# Patient Record
Sex: Male | Born: 1945
Health system: Southern US, Community
[De-identification: ages and names within clinical notes are randomized; demographics above are authoritative.]

## PROBLEM LIST (undated history)

## (undated) DIAGNOSIS — E162 Hypoglycemia, unspecified: Secondary | ICD-10-CM

## (undated) DIAGNOSIS — C801 Malignant (primary) neoplasm, unspecified: Secondary | ICD-10-CM

## (undated) DIAGNOSIS — R609 Edema, unspecified: Secondary | ICD-10-CM

## (undated) DIAGNOSIS — R600 Localized edema: Secondary | ICD-10-CM

## (undated) DIAGNOSIS — F419 Anxiety disorder, unspecified: Secondary | ICD-10-CM

## (undated) DIAGNOSIS — F32A Depression, unspecified: Secondary | ICD-10-CM

## (undated) DIAGNOSIS — T7840XA Allergy, unspecified, initial encounter: Secondary | ICD-10-CM

## (undated) DIAGNOSIS — H269 Unspecified cataract: Secondary | ICD-10-CM

## (undated) DIAGNOSIS — M1990 Unspecified osteoarthritis, unspecified site: Secondary | ICD-10-CM

## (undated) DIAGNOSIS — I10 Essential (primary) hypertension: Secondary | ICD-10-CM

## (undated) HISTORY — DX: Depression, unspecified: F32.A

## (undated) HISTORY — DX: Unspecified osteoarthritis, unspecified site: M19.90

## (undated) HISTORY — PX: GUM SURGERY: SHX658

## (undated) HISTORY — DX: Unspecified cataract: H26.9

## (undated) HISTORY — DX: Anxiety disorder, unspecified: F41.9

## (undated) HISTORY — DX: Allergy, unspecified, initial encounter: T78.40XA

## (undated) HISTORY — PX: COLON SURGERY: SHX602

---

## 2013-05-06 DIAGNOSIS — H2510 Age-related nuclear cataract, unspecified eye: Secondary | ICD-10-CM | POA: Diagnosis not present

## 2013-11-09 DIAGNOSIS — D1801 Hemangioma of skin and subcutaneous tissue: Secondary | ICD-10-CM | POA: Diagnosis not present

## 2013-11-09 DIAGNOSIS — L739 Follicular disorder, unspecified: Secondary | ICD-10-CM | POA: Diagnosis not present

## 2013-11-09 DIAGNOSIS — L259 Unspecified contact dermatitis, unspecified cause: Secondary | ICD-10-CM | POA: Diagnosis not present

## 2013-11-09 DIAGNOSIS — L821 Other seborrheic keratosis: Secondary | ICD-10-CM | POA: Diagnosis not present

## 2013-11-09 DIAGNOSIS — B351 Tinea unguium: Secondary | ICD-10-CM | POA: Diagnosis not present

## 2014-07-17 DIAGNOSIS — H1131 Conjunctival hemorrhage, right eye: Secondary | ICD-10-CM | POA: Diagnosis not present

## 2014-07-17 DIAGNOSIS — H2513 Age-related nuclear cataract, bilateral: Secondary | ICD-10-CM | POA: Diagnosis not present

## 2014-08-09 DIAGNOSIS — H2513 Age-related nuclear cataract, bilateral: Secondary | ICD-10-CM | POA: Diagnosis not present

## 2014-09-20 DIAGNOSIS — M545 Low back pain: Secondary | ICD-10-CM | POA: Diagnosis not present

## 2014-11-13 DIAGNOSIS — L821 Other seborrheic keratosis: Secondary | ICD-10-CM | POA: Diagnosis not present

## 2014-11-13 DIAGNOSIS — I872 Venous insufficiency (chronic) (peripheral): Secondary | ICD-10-CM | POA: Diagnosis not present

## 2014-11-13 DIAGNOSIS — I8312 Varicose veins of left lower extremity with inflammation: Secondary | ICD-10-CM | POA: Diagnosis not present

## 2014-11-13 DIAGNOSIS — L738 Other specified follicular disorders: Secondary | ICD-10-CM | POA: Diagnosis not present

## 2014-11-13 DIAGNOSIS — D1801 Hemangioma of skin and subcutaneous tissue: Secondary | ICD-10-CM | POA: Diagnosis not present

## 2014-11-13 DIAGNOSIS — I8311 Varicose veins of right lower extremity with inflammation: Secondary | ICD-10-CM | POA: Diagnosis not present

## 2014-11-29 ENCOUNTER — Other Ambulatory Visit: Payer: Self-pay | Admitting: Orthopedic Surgery

## 2014-11-29 DIAGNOSIS — M19071 Primary osteoarthritis, right ankle and foot: Secondary | ICD-10-CM | POA: Diagnosis not present

## 2014-11-29 DIAGNOSIS — M19072 Primary osteoarthritis, left ankle and foot: Secondary | ICD-10-CM | POA: Diagnosis not present

## 2014-11-29 DIAGNOSIS — R600 Localized edema: Secondary | ICD-10-CM

## 2014-11-29 DIAGNOSIS — R102 Pelvic and perineal pain: Secondary | ICD-10-CM

## 2014-11-29 DIAGNOSIS — M1612 Unilateral primary osteoarthritis, left hip: Secondary | ICD-10-CM | POA: Diagnosis not present

## 2014-11-29 DIAGNOSIS — M2550 Pain in unspecified joint: Secondary | ICD-10-CM | POA: Diagnosis not present

## 2014-11-29 HISTORY — DX: Localized edema: R60.0

## 2014-12-09 ENCOUNTER — Ambulatory Visit
Admission: RE | Admit: 2014-12-09 | Discharge: 2014-12-09 | Disposition: A | Discharge status: Home / Self Care | Payer: Medicare Other | Source: Ambulatory Visit | Attending: Orthopedic Surgery | Admitting: Orthopedic Surgery

## 2014-12-09 DIAGNOSIS — C801 Malignant (primary) neoplasm, unspecified: Secondary | ICD-10-CM | POA: Diagnosis not present

## 2014-12-09 DIAGNOSIS — C7951 Secondary malignant neoplasm of bone: Secondary | ICD-10-CM | POA: Diagnosis not present

## 2014-12-09 DIAGNOSIS — C7989 Secondary malignant neoplasm of other specified sites: Secondary | ICD-10-CM | POA: Diagnosis not present

## 2014-12-09 DIAGNOSIS — R102 Pelvic and perineal pain: Secondary | ICD-10-CM

## 2014-12-09 MED ORDER — GADOBENATE DIMEGLUMINE 529 MG/ML IV SOLN
15.0000 mL | Freq: Once | INTRAVENOUS | Status: AC | PRN
  Administered 2014-12-09: 15 mL via INTRAVENOUS

## 2014-12-10 ENCOUNTER — Other Ambulatory Visit: Payer: Self-pay

## 2014-12-14 ENCOUNTER — Other Ambulatory Visit: Payer: Self-pay | Admitting: Urology

## 2014-12-14 DIAGNOSIS — C7951 Secondary malignant neoplasm of bone: Secondary | ICD-10-CM

## 2014-12-14 DIAGNOSIS — C775 Secondary and unspecified malignant neoplasm of intrapelvic lymph nodes: Secondary | ICD-10-CM | POA: Diagnosis not present

## 2014-12-14 DIAGNOSIS — N402 Nodular prostate without lower urinary tract symptoms: Secondary | ICD-10-CM | POA: Diagnosis not present

## 2014-12-14 DIAGNOSIS — N1330 Unspecified hydronephrosis: Secondary | ICD-10-CM | POA: Diagnosis not present

## 2014-12-18 ENCOUNTER — Other Ambulatory Visit: Payer: Self-pay | Admitting: Urology

## 2014-12-18 DIAGNOSIS — R1032 Left lower quadrant pain: Secondary | ICD-10-CM

## 2014-12-26 ENCOUNTER — Other Ambulatory Visit: Payer: Self-pay | Admitting: Physician Assistant

## 2014-12-27 ENCOUNTER — Ambulatory Visit (HOSPITAL_COMMUNITY)
Admission: RE | Admit: 2014-12-27 | Discharge: 2014-12-27 | Disposition: A | Discharge status: Home / Self Care | Payer: Medicare Other | Source: Ambulatory Visit | Attending: Urology | Admitting: Urology

## 2014-12-27 ENCOUNTER — Encounter (HOSPITAL_COMMUNITY): Payer: Self-pay

## 2014-12-27 DIAGNOSIS — R599 Enlarged lymph nodes, unspecified: Secondary | ICD-10-CM | POA: Diagnosis not present

## 2014-12-27 DIAGNOSIS — R972 Elevated prostate specific antigen [PSA]: Secondary | ICD-10-CM | POA: Insufficient documentation

## 2014-12-27 DIAGNOSIS — R590 Localized enlarged lymph nodes: Secondary | ICD-10-CM | POA: Diagnosis not present

## 2014-12-27 DIAGNOSIS — C779 Secondary and unspecified malignant neoplasm of lymph node, unspecified: Secondary | ICD-10-CM | POA: Diagnosis not present

## 2014-12-27 DIAGNOSIS — R1030 Lower abdominal pain, unspecified: Secondary | ICD-10-CM | POA: Diagnosis not present

## 2014-12-27 DIAGNOSIS — C774 Secondary and unspecified malignant neoplasm of inguinal and lower limb lymph nodes: Secondary | ICD-10-CM | POA: Insufficient documentation

## 2014-12-27 DIAGNOSIS — C801 Malignant (primary) neoplasm, unspecified: Secondary | ICD-10-CM | POA: Diagnosis not present

## 2014-12-27 DIAGNOSIS — C7951 Secondary malignant neoplasm of bone: Secondary | ICD-10-CM | POA: Insufficient documentation

## 2014-12-27 DIAGNOSIS — N400 Enlarged prostate without lower urinary tract symptoms: Secondary | ICD-10-CM | POA: Insufficient documentation

## 2014-12-27 DIAGNOSIS — R1032 Left lower quadrant pain: Secondary | ICD-10-CM | POA: Insufficient documentation

## 2014-12-27 LAB — CBC
HCT: 37.4 % — ABNORMAL LOW (ref 39.0–52.0)
Hemoglobin: 11.8 g/dL — ABNORMAL LOW (ref 13.0–17.0)
MCH: 28.3 pg (ref 26.0–34.0)
MCHC: 31.6 g/dL (ref 30.0–36.0)
MCV: 89.7 fL (ref 78.0–100.0)
PLATELETS: 341 10*3/uL (ref 150–400)
RBC: 4.17 MIL/uL — AB (ref 4.22–5.81)
RDW: 15.3 % (ref 11.5–15.5)
WBC: 7.6 10*3/uL (ref 4.0–10.5)

## 2014-12-27 LAB — APTT: aPTT: 29 seconds (ref 24–37)

## 2014-12-27 LAB — PROTIME-INR
INR: 1.1 (ref 0.00–1.49)
PROTHROMBIN TIME: 14.4 s (ref 11.6–15.2)

## 2014-12-27 MED ORDER — MIDAZOLAM HCL 2 MG/2ML IJ SOLN
INTRAMUSCULAR | Status: AC
  Filled 2014-12-27: qty 4

## 2014-12-27 MED ORDER — MIDAZOLAM HCL 2 MG/2ML IJ SOLN
INTRAMUSCULAR | Status: AC | PRN
  Administered 2014-12-27: 0.5 mg via INTRAVENOUS
  Administered 2014-12-27: 1 mg via INTRAVENOUS

## 2014-12-27 MED ORDER — NALOXONE HCL 0.4 MG/ML IJ SOLN
INTRAMUSCULAR | Status: AC
  Filled 2014-12-27: qty 1

## 2014-12-27 MED ORDER — SODIUM CHLORIDE 0.9 % IV SOLN: INTRAVENOUS | Status: DC

## 2014-12-27 MED ORDER — FENTANYL CITRATE (PF) 100 MCG/2ML IJ SOLN
INTRAMUSCULAR | Status: AC
  Filled 2014-12-27: qty 2

## 2014-12-27 MED ORDER — FLUMAZENIL 0.5 MG/5ML IV SOLN
INTRAVENOUS | Status: AC
  Filled 2014-12-27: qty 5

## 2014-12-27 MED ORDER — FENTANYL CITRATE (PF) 100 MCG/2ML IJ SOLN
INTRAMUSCULAR | Status: AC | PRN
  Administered 2014-12-27: 50 ug via INTRAVENOUS

## 2014-12-27 NOTE — Procedures (Signed)
Interventional Radiology Procedure Note  Procedure:  Ultrasound guided core biopsy of left inguinal lymph node  Complications:  None  Estimated Blood Loss: < 10 mL  Largest high left inguinal node targeted.  18 G core biopsy x 4.  No complications.  Venetia Night. Kathlene Cote, M.D Pager:  [phone]

## 2014-12-27 NOTE — H&P (Signed)
Chief Complaint: Patient was seen in consultation today for US guided left inguinal lymph node biopsy  Referring Physician(s): Manny,Theodore  History of Present Illness: Philip [name] is a 69 y.o. male with history of lower extremity swelling and subsequent imaging revealing an enlarged lobulated prostate with widespread osseous metastasis and adenopathy in the pelvis/ left inguinal region. Patient also has markedly elevated PSA. He presents today for ultrasound-guided biopsy of an enlarged left inguinal lymph node for further evaluation.  No past medical history on file.  Past Surgical History  Procedure Laterality Date  . Gum surgery      Allergies: Review of patient's allergies indicates no known allergies.  Medications: Prior to Admission medications   Medication Sig Start Date End Date Taking? Authorizing Provider  BIOTIN PO Take 1 tablet by mouth daily.   Yes Historical Provider, MD  fexofenadine (ALLEGRA) 180 MG tablet Take 180 mg by mouth daily.   Yes Historical Provider, MD  fluticasone (FLONASE) 50 MCG/ACT nasal spray Place 1 spray into both nostrils daily as needed for allergies or rhinitis.   Yes Historical Provider, MD  Glycerin-Polysorbate 80 (REFRESH DRY EYE THERAPY OP) Apply 1 drop to eye daily.   Yes Historical Provider, MD  HYDROcodone-acetaminophen (NORCO/VICODIN) 5-325 MG tablet Take 1-2 tablets by mouth at bedtime as needed for moderate pain.  11/29/14  Yes Historical Provider, MD  hydrOXYzine (ATARAX/VISTARIL) 10 MG tablet Take 10 mg by mouth every 8 (eight) hours. 11/27/14  Yes Historical Provider, MD  ibuprofen (ADVIL,MOTRIN) 200 MG tablet Take 400 mg by mouth every 6 (six) hours as needed for moderate pain.   Yes Historical Provider, MD  Multiple Vitamin (MULTIVITAMIN WITH MINERALS) TABS tablet Take 1 tablet by mouth daily.   Yes Historical Provider, MD     No family history on file.  Social History   Social History  . Marital Status: Married   Spouse Name: N/A  . Number of Children: N/A  . Years of Education: N/A   Social History Main Topics  . Smoking status: Never Smoker   . Smokeless tobacco: Not on file  . Alcohol Use: 0.6 oz/week    1 Glasses of wine per week     Comment: occasional wine with dinner  . Drug Use: Not on file  . Sexual Activity: Not on file   Other Topics Concern  . Not on file   Social History Narrative  . No narrative on file      Review of Systems  Constitutional: Negative for fever and chills.  Respiratory: Negative for cough and shortness of breath.   Cardiovascular: Positive for leg swelling. Negative for chest pain.  Gastrointestinal: Negative for nausea, vomiting, abdominal pain and blood in stool.  Genitourinary: Positive for urgency and frequency. Negative for dysuria and hematuria.  Musculoskeletal: Negative for back pain.  Neurological: Negative for headaches.    Vital Signs: BP 163/87 mmHg  Pulse 85  Temp(Src) 98.9 F (37.2 C) (Oral)  Resp 18  Ht 6\' 4"  (1.93 m)  Wt 185 lb (83.915 kg)  BMI 22.53 kg/m2  SpO2 98%  Physical Exam  Constitutional: He appears well-developed and well-nourished.  Cardiovascular: Normal rate and regular rhythm.   Pulmonary/Chest: Effort normal.  Few bibasilar crackles  Abdominal: Soft. Bowel sounds are normal.  Musculoskeletal:  2-3+ left lower extremity edema, right lower extremity without significant edema    Mallampati Score:     Imaging: Mr [name]  12/09/2014  CLINICAL DATA:  Pelvic  pain.  Abnormal radiographs. EXAM: MRI PELVIS WITHOUT AND WITH CONTRAST TECHNIQUE: Multiplanar multisequence MR imaging of the pelvis was performed both before and after administration of intravenous contrast. CONTRAST:  69mL MULTIHANCE GADOBENATE DIMEGLUMINE 529 MG/ML IV SOLN COMPARISON:  None. FINDINGS: There are innumerable metastatic lesions throughout the visualized portion of the lumbar spine, pelvic bones, and proximal femurs. There is  also prominent adenopathy in both sides of the pelvis with the largest nodes measuring 23 mm in diameter on image 20 of series 5. There is left inguinal adenopathy with a 16 mm node visible on image 27 of series 6. There is also periaortic adenopathy just above the aortic bifurcation with a 16 mm node to the left and a 17 mm node to the right. The prostate gland is lobulated and abnormally enlarged. The left ureter is enlarged in the upper pelvis with a small amount of periureteral fluid seen on image 1 of series 6 but the ureter is not distended distally. I suspect that the tumor in the pelvis is compressing the distal left ureter. There are no pathologic fractures at this time. Small bilateral hip effusions felt to be due to the metastases in the femoral heads and necks. The patient is at risk for pathologic fractures. IMPRESSION: Widespread osseous metastases with metastatic adenopathy in the pelvis and left inguinal region. Enlarged lobulated prostate gland. I suspect that this represents metastatic prostate cancer. Obstruction of the distal left ureter. Electronically Signed   By: Lorriane Shire M.D.   On: 12/09/2014 16:34    Labs:  CBC:  Recent Labs  12/27/14 1200  WBC 7.6  HGB 11.8*  HCT 37.4*  PLT 341    COAGS:  Recent Labs  12/27/14 1200  INR 1.10  APTT 29    BMP: No results for input(s): NA, K, CL, CO2, GLUCOSE, BUN, CALCIUM, CREATININE, GFRNONAA, GFRAA in the last 8760 hours.  Invalid input(s): CMP  LIVER FUNCTION TESTS: No results for input(s): BILITOT, AST, ALT, ALKPHOS, PROT, ALBUMIN in the last 8760 hours.  TUMOR MARKERS: No results for input(s): AFPTM, CEA, CA199, CHROMGRNA in the last 8760 hours.  Assessment and Plan: Salif Tay is a 69 y.o. male with history of lower extremity swelling and subsequent imaging revealing an enlarged lobulated prostate with widespread osseous metastasis and adenopathy in the pelvis/ left inguinal region. Patient also has markedly  elevated PSA. He presents today for ultrasound-guided biopsy of an enlarged left inguinal lymph node for further evaluation.Risks and benefits discussed with the patient/wife including, but not limited to bleeding, infection, damage to adjacent structures or low yield requiring additional tests. All of the patient's questions were answered, patient is agreeable to proceed. Consent signed and in chart.     Thank you for this interesting consult.  I greatly enjoyed meeting [employer] and look forward to participating in their care.  A copy of this report was sent to the requesting provider on this date.  Signed: D. Rowe Robert 12/27/2014, 1:03 PM   I spent a total of 15 minutes in face to face in clinical consultation, greater than 50% of which was counseling/coordinating care for ultrasound-guided left inguinal lymph node biopsy

## 2014-12-27 NOTE — Discharge Instructions (Signed)
Needle Biopsy, Care After °Refer to this sheet in the next few weeks. These instructions provide you with information about caring for yourself after your procedure. Your health care provider may also give you more specific instructions. Your treatment has been planned according to current medical practices, but problems sometimes occur. Call your health care provider if you have any problems or questions after your procedure. °WHAT TO EXPECT AFTER THE PROCEDURE °After your procedure, it is common to have soreness, bruising, or mild pain at the biopsy site. This should go away in a few days. °HOME CARE INSTRUCTIONS °· Rest as directed by your health care provider. °· Take medicines only as directed by your health care provider. °· There are many different ways to close and cover the biopsy site, including stitches (sutures), skin glue, and adhesive strips. Follow your health care provider's instructions about: °· Biopsy site care. °· Bandage (dressing) changes and removal. °· Biopsy site closure removal. °· Check your biopsy site every day for signs of infection. Watch for: °· Redness, swelling, or pain. °· Fluid, blood, or pus. °SEEK MEDICAL CARE IF: °· You have a fever. °· You have redness, swelling, or pain at the biopsy site that lasts longer than a few days. °· You have fluid, blood, or pus coming from the biopsy site. °· You feel nauseous. °· You vomit. °SEEK IMMEDIATE MEDICAL CARE IF: °· You have shortness of breath. °· You have trouble breathing. °· You have chest pain.   °· You feel dizzy or you faint. °· You have bleeding that does not stop with pressure or a bandage. °· You cough up blood. °· You have pain in your abdomen. °  °This information is not intended to replace advice given to you by your health care provider. Make sure you discuss any questions you have with your health care provider. °  °Document Released: 07/04/2014 Document Reviewed: 07/04/2014 °Elsevier Interactive Patient Education ©2016  Elsevier Inc[employer] °Moderate Conscious Sedation, Adult, Care After °Refer to this sheet in the next few weeks. These instructions provide you with information on caring for yourself after your procedure. Your health care provider may also give you more specific instructions. Your treatment has been planned according to current medical practices, but problems sometimes occur. Call your health care provider if you have any problems or questions after your procedure. °WHAT TO EXPECT AFTER THE PROCEDURE  °After your procedure: °· You may feel sleepy, clumsy, and have poor balance for several hours. °· Vomiting may occur if you eat too soon after the procedure. °HOME CARE INSTRUCTIONS °· Do not participate in any activities where you could become injured for at least 24 hours. Do not: °¨ Drive. °¨ Swim. °¨ Ride a bicycle. °¨ Operate heavy machinery. °¨ Cook. °¨ Use power tools. °¨ Climb ladders. °¨ Work from a high place. °· Do not make important decisions or sign legal documents until you are improved. °· If you vomit, drink water, juice, or soup when you can drink without vomiting. Make sure you have little or no nausea before eating solid foods. °· Only take over-the-counter or prescription medicines for pain, discomfort, or fever as directed by your health care provider. °· Make sure you and your family fully understand everything about the medicines given to you, including what side effects may occur. °· You should not drink alcohol, take sleeping pills, or take medicines that cause drowsiness for at least 24 hours. °· If you smoke, do not smoke without supervision. °· If you are   feeling better, you may resume normal activities 24 hours after you were sedated. °· Keep all appointments with your health care provider. °SEEK MEDICAL CARE IF: °· Your skin is pale or bluish in color. °· You continue to feel nauseous or vomit. °· Your pain is getting worse and is not helped by medicine. °· You have bleeding or swelling. °· You  are still sleepy or feeling clumsy after 24 hours. °SEEK IMMEDIATE MEDICAL CARE IF: °· You develop a rash. °· You have difficulty breathing. °· You develop any type of allergic problem. °· You have a fever. °MAKE SURE YOU: °· Understand these instructions. °· Will watch your condition. °· Will get help right away if you are not doing well or get worse. °  °This information is not intended to replace advice given to you by your health care provider. Make sure you discuss any questions you have with your health care provider. °  °Document Released: 12/08/2012 Document Revised: 03/10/2014 Document Reviewed: 12/08/2012 °Elsevier Interactive Patient Education ©2016 Elsevier Inc. ° ° ° °

## 2014-12-29 ENCOUNTER — Encounter (HOSPITAL_COMMUNITY)
Admission: RE | Admit: 2014-12-29 | Discharge: 2014-12-29 | Disposition: A | Discharge status: Home / Self Care | Payer: Medicare Other | Source: Ambulatory Visit | Attending: Urology | Admitting: Urology

## 2014-12-29 DIAGNOSIS — C61 Malignant neoplasm of prostate: Secondary | ICD-10-CM | POA: Diagnosis not present

## 2014-12-29 DIAGNOSIS — C7951 Secondary malignant neoplasm of bone: Secondary | ICD-10-CM | POA: Insufficient documentation

## 2014-12-29 DIAGNOSIS — C775 Secondary and unspecified malignant neoplasm of intrapelvic lymph nodes: Secondary | ICD-10-CM | POA: Diagnosis not present

## 2014-12-29 DIAGNOSIS — R591 Generalized enlarged lymph nodes: Secondary | ICD-10-CM | POA: Diagnosis not present

## 2014-12-29 DIAGNOSIS — N402 Nodular prostate without lower urinary tract symptoms: Secondary | ICD-10-CM | POA: Diagnosis not present

## 2014-12-29 MED ORDER — TECHNETIUM TC 99M MEDRONATE IV KIT
26.0000 | PACK | Freq: Once | INTRAVENOUS | Status: AC | PRN
  Administered 2014-12-29: 26 via INTRAVENOUS

## 2015-01-01 ENCOUNTER — Other Ambulatory Visit: Payer: Self-pay | Admitting: Urology

## 2015-01-02 ENCOUNTER — Encounter (HOSPITAL_COMMUNITY): Payer: Self-pay | Admitting: *Deleted

## 2015-01-03 ENCOUNTER — Encounter (HOSPITAL_COMMUNITY): Payer: Self-pay

## 2015-01-03 ENCOUNTER — Ambulatory Visit (HOSPITAL_COMMUNITY): Payer: Medicare Other | Admitting: Registered Nurse

## 2015-01-03 ENCOUNTER — Ambulatory Visit (HOSPITAL_COMMUNITY)
Admission: RE | Admit: 2015-01-03 | Discharge: 2015-01-03 | Disposition: A | Discharge status: Home / Self Care | Payer: Medicare Other | Source: Ambulatory Visit | Attending: Urology | Admitting: Urology

## 2015-01-03 ENCOUNTER — Encounter (HOSPITAL_COMMUNITY)
Admission: RE | Disposition: A | Discharge status: Home / Self Care | Payer: Self-pay | Source: Ambulatory Visit | Attending: Urology

## 2015-01-03 DIAGNOSIS — C7951 Secondary malignant neoplasm of bone: Secondary | ICD-10-CM | POA: Diagnosis not present

## 2015-01-03 DIAGNOSIS — C61 Malignant neoplasm of prostate: Secondary | ICD-10-CM | POA: Insufficient documentation

## 2015-01-03 DIAGNOSIS — D2921 Benign neoplasm of right testis: Secondary | ICD-10-CM | POA: Diagnosis not present

## 2015-01-03 DIAGNOSIS — C775 Secondary and unspecified malignant neoplasm of intrapelvic lymph nodes: Secondary | ICD-10-CM | POA: Diagnosis not present

## 2015-01-03 DIAGNOSIS — R5383 Other fatigue: Secondary | ICD-10-CM | POA: Diagnosis not present

## 2015-01-03 DIAGNOSIS — R590 Localized enlarged lymph nodes: Secondary | ICD-10-CM | POA: Insufficient documentation

## 2015-01-03 HISTORY — PX: ORCHIECTOMY: SHX2116

## 2015-01-03 HISTORY — DX: Edema, unspecified: R60.9

## 2015-01-03 HISTORY — DX: Hypoglycemia, unspecified: E16.2

## 2015-01-03 HISTORY — DX: Localized edema: R60.0

## 2015-01-03 HISTORY — DX: Malignant (primary) neoplasm, unspecified: C80.1

## 2015-01-03 LAB — GLUCOSE, CAPILLARY
GLUCOSE-CAPILLARY: 91 mg/dL (ref 65–99)
GLUCOSE-CAPILLARY: 103 mg/dL — AB (ref 65–99)

## 2015-01-03 SURGERY — ORCHIECTOMY
Anesthesia: General | Laterality: Bilateral

## 2015-01-03 MED ORDER — CEFAZOLIN SODIUM-DEXTROSE 2-3 GM-% IV SOLR
INTRAVENOUS | Status: AC
  Filled 2015-01-03: qty 50

## 2015-01-03 MED ORDER — PROMETHAZINE HCL 25 MG/ML IJ SOLN
INTRAMUSCULAR | Status: AC
  Filled 2015-01-03: qty 1

## 2015-01-03 MED ORDER — MIDAZOLAM HCL 5 MG/5ML IJ SOLN
INTRAMUSCULAR | Status: DC | PRN
  Administered 2015-01-03 (×2): 1 mg via INTRAVENOUS

## 2015-01-03 MED ORDER — OXYCODONE-ACETAMINOPHEN 5-325 MG PO TABS: 1.0000 | ORAL_TABLET | Freq: Four times a day (QID) | ORAL | Status: DC | PRN

## 2015-01-03 MED ORDER — BUPIVACAINE HCL (PF) 0.25 % IJ SOLN
INTRAMUSCULAR | Status: DC | PRN
Start: 2015-01-03 — End: 2015-01-03
  Administered 2015-01-03: 10 mL

## 2015-01-03 MED ORDER — MIDAZOLAM HCL 2 MG/2ML IJ SOLN
INTRAMUSCULAR | Status: AC
  Filled 2015-01-03: qty 4

## 2015-01-03 MED ORDER — MEPERIDINE HCL 50 MG/ML IJ SOLN: 6.2500 mg | INTRAMUSCULAR | Status: DC | PRN

## 2015-01-03 MED ORDER — ONDANSETRON HCL 4 MG/2ML IJ SOLN
INTRAMUSCULAR | Status: DC | PRN
  Administered 2015-01-03: 4 mg via INTRAVENOUS

## 2015-01-03 MED ORDER — CEFAZOLIN SODIUM-DEXTROSE 2-3 GM-% IV SOLR
2.0000 g | INTRAVENOUS | Status: AC
  Administered 2015-01-03: 2 g via INTRAVENOUS

## 2015-01-03 MED ORDER — OXYCODONE-ACETAMINOPHEN 5-325 MG PO TABS
1.0000 | ORAL_TABLET | Freq: Four times a day (QID) | ORAL | Status: DC | PRN
  Administered 2015-01-03: 1 via ORAL
  Filled 2015-01-03: qty 1

## 2015-01-03 MED ORDER — PROMETHAZINE HCL 25 MG/ML IJ SOLN
6.2500 mg | INTRAMUSCULAR | Status: DC | PRN
  Administered 2015-01-03: 6.25 mg via INTRAVENOUS

## 2015-01-03 MED ORDER — FENTANYL CITRATE (PF) 100 MCG/2ML IJ SOLN
INTRAMUSCULAR | Status: DC | PRN
  Administered 2015-01-03 (×3): 50 ug via INTRAVENOUS

## 2015-01-03 MED ORDER — FENTANYL CITRATE (PF) 100 MCG/2ML IJ SOLN: 25.0000 ug | INTRAMUSCULAR | Status: DC | PRN

## 2015-01-03 MED ORDER — BUPIVACAINE HCL 0.25 % IJ SOLN
INTRAMUSCULAR | Status: AC
  Filled 2015-01-03: qty 1

## 2015-01-03 MED ORDER — ONDANSETRON HCL 4 MG/2ML IJ SOLN
INTRAMUSCULAR | Status: AC
  Filled 2015-01-03: qty 2

## 2015-01-03 MED ORDER — LIDOCAINE HCL (CARDIAC) 20 MG/ML IV SOLN
INTRAVENOUS | Status: DC | PRN
  Administered 2015-01-03: 100 mg via INTRAVENOUS

## 2015-01-03 MED ORDER — FENTANYL CITRATE (PF) 250 MCG/5ML IJ SOLN
INTRAMUSCULAR | Status: AC
  Filled 2015-01-03: qty 25

## 2015-01-03 MED ORDER — LIDOCAINE HCL (CARDIAC) 20 MG/ML IV SOLN
INTRAVENOUS | Status: AC
  Filled 2015-01-03: qty 5

## 2015-01-03 MED ORDER — EPHEDRINE SULFATE 50 MG/ML IJ SOLN
INTRAMUSCULAR | Status: DC | PRN
  Administered 2015-01-03: 5 mg via INTRAVENOUS

## 2015-01-03 MED ORDER — PROPOFOL 10 MG/ML IV BOLUS
INTRAVENOUS | Status: DC | PRN
  Administered 2015-01-03: 200 mg via INTRAVENOUS

## 2015-01-03 MED ORDER — 0.9 % SODIUM CHLORIDE (POUR BTL) OPTIME
TOPICAL | Status: DC | PRN
  Administered 2015-01-03: 1000 mL

## 2015-01-03 MED ORDER — LACTATED RINGERS IV SOLN
INTRAVENOUS | Status: DC
  Administered 2015-01-03: 1000 mL via INTRAVENOUS

## 2015-01-03 MED ORDER — PROPOFOL 10 MG/ML IV BOLUS
INTRAVENOUS | Status: AC
  Filled 2015-01-03: qty 20

## 2015-01-03 MED ORDER — SENNOSIDES-DOCUSATE SODIUM 8.6-50 MG PO TABS: 1.0000 | ORAL_TABLET | Freq: Two times a day (BID) | ORAL | Status: DC

## 2015-01-03 SURGICAL SUPPLY — 32 items
BENZOIN TINCTURE PRP APPL 2/3 (GAUZE/BANDAGES/DRESSINGS) ×3 IMPLANT
BLADE HEX COATED 2.75 (ELECTRODE) ×3 IMPLANT
BLADE SURG 15 STRL LF DISP TIS (BLADE) ×1 IMPLANT
BLADE SURG 15 STRL SS (BLADE) ×2
BNDG GAUZE ELAST 4 BULKY (GAUZE/BANDAGES/DRESSINGS) ×3 IMPLANT
COVER SURGICAL LIGHT HANDLE (MISCELLANEOUS) IMPLANT
DRAIN PENROSE 18X1/2 LTX STRL (DRAIN) ×3 IMPLANT
DRAIN PENROSE 18X1/4 LTX STRL (WOUND CARE) ×3 IMPLANT
DRAPE LAPAROTOMY T 102X78X121 (DRAPES) ×3 IMPLANT
ELECT PENCIL ROCKER SW 15FT (MISCELLANEOUS) ×3 IMPLANT
ELECT REM PT RETURN 9FT ADLT (ELECTROSURGICAL) ×3
ELECTRODE REM PT RTRN 9FT ADLT (ELECTROSURGICAL) ×1 IMPLANT
GAUZE SPONGE 4X4 12PLY STRL (GAUZE/BANDAGES/DRESSINGS) ×3 IMPLANT
GLOVE BIOGEL M STRL SZ7.5 (GLOVE) ×3 IMPLANT
GOWN STRL REUS W/TWL LRG LVL3 (GOWN DISPOSABLE) ×6 IMPLANT
KIT BASIN OR (CUSTOM PROCEDURE TRAY) ×3 IMPLANT
LIQUID BAND (GAUZE/BANDAGES/DRESSINGS) ×3 IMPLANT
NEEDLE HYPO 22GX1.5 SAFETY (NEEDLE) IMPLANT
NS IRRIG 1000ML POUR BTL (IV SOLUTION) IMPLANT
PACK BASIC VI WITH GOWN DISP (CUSTOM PROCEDURE TRAY) ×3 IMPLANT
SPONGE LAP 4X18 X RAY DECT (DISPOSABLE) ×6 IMPLANT
SUPPORT SCROTAL LG STRP (MISCELLANEOUS) ×2 IMPLANT
SUPPORTER ATHLETIC LG (MISCELLANEOUS) ×1
SUT ETHILON 3 0 PS 1 (SUTURE) ×3 IMPLANT
SUT MNCRL AB 4-0 PS2 18 (SUTURE) ×3 IMPLANT
SUT VIC AB 3-0 SH 27 (SUTURE) ×2
SUT VIC AB 3-0 SH 27XBRD (SUTURE) ×1 IMPLANT
SUT VICRYL 2 0 18  UND BR (SUTURE) ×2
SUT VICRYL 2 0 18 UND BR (SUTURE) ×1 IMPLANT
SYR 20CC LL (SYRINGE) ×3 IMPLANT
SYR CONTROL 10ML LL (SYRINGE) IMPLANT
WATER STERILE IRR 1500ML POUR (IV SOLUTION) IMPLANT

## 2015-01-03 NOTE — H&P (Signed)
Philip [name] is an 69 y.o. male.    Chief Complaint: Pre-op Bilateral Orchiectomy  HPI:   1 - Widely Metastatic Prostate Cancer - diffuse bony, and nodal metastatic cancer with PSA >2000 on eval pelvic pain and left leg swelling 2016. BX of left inguinal lymph node also c/w prostate source.  Today "Philip [name]" is seen to proceed with bilateral orchiectomy for goal of rapid and permanent androgen deprivation as first step in management of his large volume metastatic prostate cancer.   Past Medical History  Diagnosis Date  . Swelling LAST 30 DAYS DR [facility] AWARE    LEFT LEG AND FOOT  . Cancer [facility])     PROSTATE    Past Surgical History  Procedure Laterality Date  . Gum surgery      TEETH IMPLANTS ALSO    History reviewed. No pertinent family history. Social History:  reports that he has never smoked. He has never used smokeless tobacco. He reports that he drinks about 0.6 oz of alcohol per week. He reports that he does not use illicit drugs.  Allergies: No Known Allergies  No prescriptions prior to admission    No results found for this or any previous visit (from the past 48 hour(s)). No results found.  Review of Systems  Constitutional: Positive for weight loss and malaise/fatigue. Negative for fever and chills.  HENT: Negative.   Eyes: Negative.   Respiratory: Negative.   Cardiovascular: Positive for leg swelling.  Gastrointestinal: Negative.   Genitourinary: Negative.  Negative for hematuria and flank pain.  Musculoskeletal: Negative.   Skin: Negative.   Neurological: Negative.   Endo/Heme/Allergies: Negative.   Psychiatric/Behavioral: Negative.     There were no vitals taken for this visit. Physical Exam  Constitutional: He appears well-developed.  Eyes: Pupils are equal, round, and reactive to light.  Neck: Normal range of motion.  Cardiovascular: Normal rate.   Respiratory: Effort normal.  GI: Soft.  Genitourinary: Penis normal.  No CVAT  Musculoskeletal:   LT>Rt LL edema, stable.   Neurological: He is alert.  Skin: Skin is warm.  Psychiatric: He has a normal mood and affect. His behavior is normal. Judgment and thought content normal.     Assessment/Plan  1 - Widely Metastatic Prostate Cancer - proceed with orchiectomy today. Risks, benefits, alternatives discussed in detail previously and reiterated again today as well as the permanance of procedure and its consequences.  Will plan to leave small penrose drain with office removal next week.   I am optimistic some of his bulky adenopathy will regress and allow better lymphatic drainage from lower extremities.   Regie Bunner 01/03/2015, 6:41 AM

## 2015-01-03 NOTE — Discharge Instructions (Signed)
1 - You may have small amount of bloody drainage from drain site for few days. This is normal.  2 - Call MD or go to ER for fever >102, severe pain / nausea / vomiting not relieved by medications, or acute change in medical status     General Anesthesia, Adult, Care After Refer to this sheet in the next few weeks. These instructions provide you with information on caring for yourself after your procedure. Your health care provider may also give you more specific instructions. Your treatment has been planned according to current medical practices, but problems sometimes occur. Call your health care provider if you have any problems or questions after your procedure. WHAT TO EXPECT AFTER THE PROCEDURE After the procedure, it is typical to experience:  Sleepiness.  Nausea and vomiting. HOME CARE INSTRUCTIONS  For the first 24 hours after general anesthesia:  Have a responsible person with you.  Do not drive a car. If you are alone, do not take public transportation.  Do not drink alcohol.  Do not take medicine that has not been prescribed by your health care provider.  Do not sign important papers or make important decisions.  You may resume a normal diet and activities as directed by your health care provider.  Change bandages (dressings) as directed.  If you have questions or problems that seem related to general anesthesia, call the hospital and ask for the anesthetist or anesthesiologist on call. SEEK MEDICAL CARE IF:  You have nausea and vomiting that continue the day after anesthesia.  You develop a rash. SEEK IMMEDIATE MEDICAL CARE IF:   You have difficulty breathing.  You have chest pain.  You have any allergic problems.   This information is not intended to replace advice given to you by your health care provider. Make sure you discuss any questions you have with your health care provider.   Document Released: 05/26/2000 Document Revised: 03/10/2014 Document  Reviewed: 06/18/2011 Elsevier Interactive Patient Education [employer].

## 2015-01-03 NOTE — Progress Notes (Signed)
Patient complains of burning sensation like he has to urinate after bilateral orchiectomy.  Up to bathroom. He belches a lot and has pains moving across lower abdomen which is relieved with flatus. He complains of feeling dizzy and assisted back to recliner chair. He states he feels like his blood sugar is dropping. Stat CBG is 103 mg/dl. He is pale and complains of feeling dizzy. Encouraged to rest and try to nap.

## 2015-01-03 NOTE — Anesthesia Postprocedure Evaluation (Signed)
  Anesthesia Post-op Note  Patient: Philip [name]  Procedure(s) Performed: Procedure(s) (LRB): ORCHIECTOMY (Bilateral)  Patient Location: PACU  Anesthesia Type: General  Level of Consciousness: awake and alert   Airway and Oxygen Therapy: Patient Spontanous Breathing  Post-op Pain: mild  Post-op Assessment: Post-op Vital signs reviewed, Patient's Cardiovascular Status Stable, Respiratory Function Stable, Patent Airway and No signs of Nausea or vomiting  Last Vitals:  Filed Vitals:   01/03/15 1806  BP: 160/83  Pulse: 81  Temp: 36.2 C  Resp: 15    Post-op Vital Signs: stable   Complications: No apparent anesthesia complications

## 2015-01-03 NOTE — Anesthesia Preprocedure Evaluation (Addendum)
Anesthesia Evaluation  Patient identified by MRN, date of birth, ID band Patient awake    Reviewed: Allergy & Precautions, NPO status , Patient's Chart, lab work & pertinent test results  Airway Mallampati: II  TM Distance: >3 FB Neck ROM: Full    Dental no notable dental hx.    Pulmonary neg pulmonary ROS,    Pulmonary exam normal breath sounds clear to auscultation       Cardiovascular negative cardio ROS Normal cardiovascular exam Rhythm:Regular Rate:Normal     Neuro/Psych negative neurological ROS  negative psych ROS   GI/Hepatic negative GI ROS, Neg liver ROS,   Endo/Other  negative endocrine ROS  Renal/GU negative Renal ROS  negative genitourinary   Musculoskeletal negative musculoskeletal ROS (+)   Abdominal   Peds negative pediatric ROS (+)  Hematology negative hematology ROS (+)   Anesthesia Other Findings   Reproductive/Obstetrics negative OB ROS                             Anesthesia Physical Anesthesia Plan  ASA: II  Anesthesia Plan: General   Post-op Pain Management:    Induction: Intravenous  Airway Management Planned: LMA  Additional Equipment:   Intra-op Plan:   Post-operative Plan: Extubation in OR  Informed Consent: I have reviewed the patients History and Physical, chart, labs and discussed the procedure including the risks, benefits and alternatives for the proposed anesthesia with the patient or authorized representative who has indicated his/her understanding and acceptance.   Dental advisory given  Plan Discussed with: CRNA  Anesthesia Plan Comments:         Anesthesia Quick Evaluation  

## 2015-01-03 NOTE — Brief Op Note (Signed)
01/03/2015  5:00 PM  PATIENT:  Pilar Plate Gessel  69 y.o. male  PRE-OPERATIVE DIAGNOSIS:  METASTASTIC PROSTATE CANCER  POST-OPERATIVE DIAGNOSIS:  METASTASTIC PROSTATE CANCER  PROCEDURE:  Procedure(s): ORCHIECTOMY (Bilateral)  SURGEON:  Surgeon(s) and Role:    * Alexis Frock, MD - Primary  PHYSICIAN ASSISTANT:   ASSISTANTS: none   ANESTHESIA:   local and general  EBL:     BLOOD ADMINISTERED:none  DRAINS: Penrose drain in the dependant scrotum to wound drainage   LOCAL MEDICATIONS USED:  MARCAINE     SPECIMEN:  Source of Specimen:  Rt and Lt testicles  DISPOSITION OF SPECIMEN:  PATHOLOGY  COUNTS:  YES  TOURNIQUET:  * No tourniquets in log *  DICTATION: .Other Dictation: Dictation Number [id number]  PLAN OF CARE: Discharge to home after PACU  PATIENT DISPOSITION:  PACU - hemodynamically stable.   Delay start of Pharmacological VTE agent (>24hrs) due to surgical blood loss or risk of bleeding: not applicable

## 2015-01-03 NOTE — Anesthesia Procedure Notes (Signed)
Procedure Name: LMA Insertion Date/Time: 01/03/2015 4:29 PM Performed by: Carleene Cooper A Pre-anesthesia Checklist: Patient identified, Emergency Drugs available, Suction available, Patient being monitored and Timeout performed Patient Re-evaluated:Patient Re-evaluated prior to inductionOxygen Delivery Method: Circle system utilized Preoxygenation: Pre-oxygenation with 100% oxygen Intubation Type: IV induction LMA: LMA with gastric port inserted LMA Size: 4.0 Number of attempts: 1 Placement Confirmation: positive ETCO2 and breath sounds checked- equal and bilateral Tube secured with: Tape Dental Injury: Teeth and Oropharynx as per pre-operative assessment

## 2015-01-03 NOTE — Transfer of Care (Signed)
Immediate Anesthesia Transfer of Care Note  Patient: Philip [name]  Procedure(s) Performed: Procedure(s): ORCHIECTOMY (Bilateral)  Patient Location: PACU  Anesthesia Type:General  Level of Consciousness: awake, alert , oriented and patient cooperative  Airway & Oxygen Therapy: Patient Spontanous Breathing and Patient connected to face mask oxygen  Post-op Assessment: Report given to RN, Post -op Vital signs reviewed and stable and Patient moving all extremities  Post vital signs: Reviewed and stable  Last Vitals:  Filed Vitals:   01/03/15 1417  BP: 173/85  Pulse: 94  Temp: 36.8 C  Resp: 18    Complications: No apparent anesthesia complications

## 2015-01-04 ENCOUNTER — Encounter (HOSPITAL_COMMUNITY): Payer: Self-pay | Admitting: Urology

## 2015-01-04 NOTE — Op Note (Signed)
NAME:  Philip [name], Philip [name] NO.:  [id number]  MEDICAL RECORD NO.:  [id number]  LOCATION:  [facility]                         FACILITY:  [facility]  PHYSICIAN:  Alexis Frock, MD     DATE OF BIRTH:  01-08-1946  DATE OF PROCEDURE: 01/03/2015                              OPERATIVE REPORT  DIAGNOSES:  Metastatic prostate cancer, bulky retroperitoneal adenopathy and multifocal bony metastasis.  PROCEDURE:  Bilateral simple orchiectomy.  ESTIMATED BLOOD LOSS:  Nil.  CONTUSIONS:  None.  SPECIMENS:  Right and left testicle for permanent pathology.  DRAIN:  Penrose drain to wound drainage.  FINDINGS: 1. Chronic left leg edema. 2. Unremarkable bilateral testes.  INDICATION:  Mr. Stillings is a very pleasant, but unfortunate 69 year old gentleman, who was found on workup of pelvic pain and left leg swelling to have multifocal blastic pelvic bone lesions as well as adenopathy worrisome for possible prostate carcinoma.  His PSA was over 2000 on biopsy of representative left inguinal lymph node, most consistent with metastatic adenocarcinoma of prostatic origin.  CT and bone scan corroborated as well.  Given his bulky disease, it was felt that urgent entering the operation was warranted with a permanent modality that would prevent androgen flare.  Options were discussed including LHRH antagonist versus orchiectomy and he wished to proceed with the latter. Informed consent was obtained and placed in the medical record.  PROCEDURE IN DETAIL:  The patient being [employer], was verified. Procedure being simple orchiectomy was confirmed.  Procedure was carried out.  Time-out was performed.  Intravenous antibiotics were administered.  General anesthesia was introduced.  The patient was placed in supine position and sterile field was created by first clipper shaving and then prepping on the patient's scrotum using iodine.  Next, incision was made approximately 3 cm in length along the median  raphe directly onto the right testicle, which was delivered within the tunica vaginalis in the operative field.  The cord was dissected away from anterior scrotal fat and divided into two sections with the vas in 1 vessels and the other.  These were doubly clamped, ligated and right testicle was set aside for permanent pathology.  The common cord was tied with large Vicryl tie proximally and then with separate Vicryl ties on each of its divided segments distally, resulted in excellent hemostasis.  Then, right cord was redelivered into the right hemiscrotum.  With the same incision, the left hemiscrotal compartment was entered and the left testicle was delivered within the tunica vaginalis.  It similarly was dissected proximally.  Cord was divided in two segments, ligated.  The left testicle was set aside for permanent pathology separately.  Similarly, the left cord was tied off as was the right, which also resulted an excellent hemostasis.  The left cord redelivered in the hemiscrotum.  A Penrose drain was brought through small counterincision in the dependent scrotum and anchored in place with drain stitch.  The scrotum was closed in two layers, first with the dartos with running 3-0 Vicryl and to the level of skin using running Monocryl.  A dressing of fluff and scrotal support was applied. Procedure was terminated.  The patient tolerated the procedure well.  There were no immediate periprocedural complications.  The patient was taken to the postanesthesia care unit in stable condition.          ______________________________ Alexis Frock, MD     TM/MEDQ  D:  01/03/2015  T:  01/04/2015  Job:  [id number]

## 2015-01-08 DIAGNOSIS — C61 Malignant neoplasm of prostate: Secondary | ICD-10-CM | POA: Diagnosis not present

## 2015-01-19 DIAGNOSIS — Z23 Encounter for immunization: Secondary | ICD-10-CM | POA: Diagnosis not present

## 2015-01-24 ENCOUNTER — Telehealth: Payer: Self-pay | Admitting: Oncology

## 2015-01-24 NOTE — Telephone Encounter (Signed)
Lt mess and mailed out new pt packet for  appt in dec

## 2015-02-01 ENCOUNTER — Telehealth: Payer: Self-pay | Admitting: *Deleted

## 2015-02-01 NOTE — Telephone Encounter (Signed)
This RN spoke with patient reminding him of his appointment with Dr. Alen Blew on 02/02/15. Patient verbalized understanding.

## 2015-02-02 ENCOUNTER — Telehealth: Payer: Self-pay | Admitting: Oncology

## 2015-02-02 ENCOUNTER — Ambulatory Visit (HOSPITAL_BASED_OUTPATIENT_CLINIC_OR_DEPARTMENT_OTHER): Payer: Medicare Other | Admitting: Oncology

## 2015-02-02 ENCOUNTER — Telehealth: Payer: Self-pay | Admitting: *Deleted

## 2015-02-02 VITALS — BP 149/77 | HR 82 | Temp 97.8°F | Resp 18 | Ht 76.0 in | Wt 196.7 lb

## 2015-02-02 DIAGNOSIS — D649 Anemia, unspecified: Secondary | ICD-10-CM | POA: Diagnosis not present

## 2015-02-02 DIAGNOSIS — C7951 Secondary malignant neoplasm of bone: Secondary | ICD-10-CM | POA: Diagnosis not present

## 2015-02-02 DIAGNOSIS — C61 Malignant neoplasm of prostate: Secondary | ICD-10-CM

## 2015-02-02 MED ORDER — LIDOCAINE-PRILOCAINE 2.5-2.5 % EX CREA: 1.0000 "application " | TOPICAL_CREAM | CUTANEOUS | Status: DC | PRN

## 2015-02-02 MED ORDER — ONDANSETRON HCL 4 MG PO TABS: 4.0000 mg | ORAL_TABLET | Freq: Three times a day (TID) | ORAL | Status: DC | PRN

## 2015-02-02 NOTE — Telephone Encounter (Signed)
Per staff message and POF I have scheduled appts. Advised scheduler of appts. JMW  

## 2015-02-02 NOTE — Telephone Encounter (Signed)
returned call and s.w. pt and r/s the chemo edu to 12.9 per pt request..Marland KitchenMarland KitchenMarland Kitchenpt ok and aware of new d.t

## 2015-02-02 NOTE — Progress Notes (Signed)
Please see consult note.  

## 2015-02-02 NOTE — Telephone Encounter (Signed)
per pof to sch pt appt-sent MW email to sch pt trmt-gave pt copy of avs-pt to get uopdated sch @ chemo class on 12/7

## 2015-02-02 NOTE — Consult Note (Signed)
Reason for Referral: Prostate cancer.   HPI: 69 year old gentleman currently living in this area and have done so for the last 35 years. He is a rather healthy gentleman without any history of any chronic comorbid conditions. Her last few months he started noticing left lower extremity edema and subsequently received orthopedic evaluation including MRI of the pelvis. That was done on 12/09/2014 which showed innumerable metastatic bony lesions in the lumbar spine and the pelvis also the prostate gland was enlarged. Pelvic adenopathy was also noted at that time including periaortic adenopathy. He was subsequently evaluated by Dr. Tresa Moore and arrange for an ultrasound guided biopsy of his pelvic adenopathy. The biopsy confirmed the presence of adenocarcinoma. His PSA was admitted to be elevated at 2981 with alkaline phosphatase of 381. Bone scan on 12/29/2014 showed widespread metastatic disease including the cervical, thoracic, lumbar spine as well as ribs, pelvis and extremities. He underwent surgical castration  With bilateral orchiectomy on 01/03/2015. He tolerated the procedure well and referred to me for further evaluation regarding his prostate cancer. Clinically,he is relatively asymptomatic. He reports his lower extremity edema has improved in the last few weeks. Has not reported any bone pain at this time including no neck pain and back pain or shoulder pain. His appetite has been excellent and his weight have been stable. He continues to work in his [employer] without any decline.  He does not report any headaches, blurry vision, syncope or seizures. He does not report any fevers, chills, sweats or weight loss. He does not report any chest pain, palpitation, orthopnea or leg edema. Does not report any cough, wheezing, hemoptysis or dyspnea on exertion. He does not report any nausea, vomiting, abdominal pain, hematochezia, melena, or the satiety or change in his bowel habits. He does not report  any frequency, urgency or hesitancy. Does not report any hematuria or dysuria. He does not report any arthralgias, myalgias. Does not report any lymphadenopathy or petechiae. Does not report any change in his mood including depression or anxiety. Remaining review of systems unremarkable.  Past Medical History  Diagnosis Date  . Swelling LAST 30 DAYS DR [facility] AWARE    LEFT LEG AND FOOT  . Cancer ([place])     PROSTATE  . Edema leg Sept 28, 2016    left leg from foot to thigh, increasinly worse over last 4 weeks  . Hypoglycemia   :  Past Surgical History  Procedure Laterality Date  . Gum surgery      TEETH IMPLANTS ALSO  . Orchiectomy Bilateral 01/03/2015    Procedure: ORCHIECTOMY;  Surgeon: Alexis Frock, MD;  Location: [facility];  Service: Urology;  Laterality: Bilateral;  :   Current outpatient prescriptions:  .  BIOTIN 5000 PO, Take 1 tablet by mouth daily., Disp: , Rfl:  .  Calcium Carbonate-Vitamin D3 (CALCIUM 600+D3) 600-400 MG-UNIT TABS, Take 1 tablet by mouth daily., Disp: , Rfl:  .  fexofenadine (ALLEGRA) 180 MG tablet, Take 180 mg by mouth daily., Disp: , Rfl:  .  fluticasone (FLONASE) 50 MCG/ACT nasal spray, Place 1 spray into both nostrils daily as needed for allergies or rhinitis., Disp: , Rfl:  .  Glycerin-Polysorbate 80 (REFRESH DRY EYE THERAPY OP), Apply 1 drop to eye daily., Disp: , Rfl:  .  halobetasol (ULTRAVATE) 0.05 % ointment, Apply 1 application topically as needed. For exzema., Disp: , Rfl:  .  hydrOXYzine (ATARAX/VISTARIL) 10 MG tablet, Take 10 mg by mouth every 8 (eight) hours., Disp: , Rfl: 4 .  Multiple Vitamin (MULTIVITAMIN WITH MINERALS) TABS tablet, Take 1 tablet by mouth daily., Disp: , Rfl:  .  terbinafine (LAMISIL) 1 % cream, Apply 1 application topically 2 (two) times daily., Disp: , Rfl:  .  tretinoin (RETIN-A) 0.1 % cream, Apply 1 application topically at bedtime., Disp: , Rfl:  .  lidocaine-prilocaine (EMLA) cream, Apply 1 application topically as  needed. Apply to portacath before each chemotherapy., Disp: 30 g, Rfl: 0 .  ondansetron (ZOFRAN) 4 MG tablet, Take 1 tablet (4 mg total) by mouth every 8 (eight) hours as needed for nausea or vomiting., Disp: 20 tablet, Rfl: 0:  No Known Allergies:  No family history on file.:  Social History   Social History  . Marital Status: Married    Spouse Name: N/A  . Number of Children: N/A  . Years of Education: N/A   Occupational History  . Not on file.   Social History Main Topics  . Smoking status: Never Smoker   . Smokeless tobacco: Never Used  . Alcohol Use: 0.6 oz/week    1 Glasses of wine per week     Comment: occasional wine with dinner  . Drug Use: No  . Sexual Activity: Not on file   Other Topics Concern  . Not on file   Social History Narrative  :  Pertinent items are noted in HPI.  Exam: Blood pressure 149/77, pulse 82, temperature 97.8 F (36.6 C), temperature source Oral, resp. rate 18, height 6\' 4"  (1.93 m), weight 196 lb 11.2 oz (89.223 kg), SpO2 100 %. General appearance: alert and cooperative Head: Normocephalic, without obvious abnormality Nose: Nares normal. Septum midline. Mucosa normal. No drainage or sinus tenderness. Throat: lips, mucosa, and tongue normal; teeth and gums normal Neck: no adenopathy Back: negative Resp: clear to auscultation bilaterally Chest wall: no tenderness Cardio: regular rate and rhythm, S1, S2 normal, no murmur, click, rub or gallop GI: soft, non-tender; bowel sounds normal; no masses,  no organomegaly Extremities: extremities normal, atraumatic, no cyanosis or edema Pulses: 2+ and symmetric Skin: Skin color, texture, turgor normal. No rashes or lesions Lymph nodes: Cervical, supraclavicular, and axillary nodes normal. Neurologic: Grossly normal  CBC    Component Value Date/Time   WBC 7.6 12/27/2014 1200   RBC 4.17* 12/27/2014 1200   HGB 11.8* 12/27/2014 1200   HCT 37.4* 12/27/2014 1200   PLT 341 12/27/2014 1200    MCV 89.7 12/27/2014 1200   MCH 28.3 12/27/2014 1200   MCHC 31.6 12/27/2014 1200   RDW 15.3 12/27/2014 1200   EXAM: NUCLEAR MEDICINE WHOLE BODY BONE SCAN  TECHNIQUE: Whole body anterior and posterior images were obtained approximately 3 hours after intravenous injection of radiopharmaceutical.  RADIOPHARMACEUTICALS: 26.0 mCi Technetium-2m MDP IV  COMPARISON: None.  FINDINGS: Widespread bony metastasis are noted and involve the skull, cervical, thoracic and lumbar spine, multiple bilateral ribs, lower sternum, left clavicle, bilateral shoulders and proximal humeri, bilateral bony pelvis and bilateral femurs.  IMPRESSION: Widespread osseous metastases.  IMPRESSION: Widespread osseous metastases with metastatic adenopathy in the pelvis and left inguinal region.  Enlarged lobulated prostate gland. I suspect that this represents metastatic prostate cancer.  Obstruction of the distal left ureter.     Assessment and Plan:    69 year old gentleman with the following issues:  1. Advanced prostate cancer diagnosed in October 2016. He presented with PSA of 2900 and unknown Gleason score. His disease includes widespread bony metastasis as well as pelvic adenopathy. He is status post bilateral orchiectomy done in November  2016.  The natural course of this disease was reviewed today with the patient along with her treatment options. He understands any treatment moving forward would be palliative not curative at this time. I agree with the initial approach of androgen deprivation and he elected to proceed with surgical castration which is certainly an excellent option. He understands that this maneuver will definitely help his cancer volume and metastasis and decrease his PSA dramatically but certainly does not offer cure.  The rationale for using Taxotere chemotherapy in the hormone sensitive advanced prostate cancer setting was reviewed extensively. Multiple clinical  trials have showed dramatic benefit of using 6 cycles of Taxotere therapy in addition to hormone therapy upfront and yielded significant overall survival that is close to 13 months on average. He will be an excellent candidate for it given his excellent health and high volume disease. He has widespread bony metastasis and bulky adenopathy.  The logistics of administration of systemic chemotherapy in the form of Taxotere were reviewed extensively. He will require Taxotere at 75 mg/m every 3 weeks for total of 6 cycles. Complications associated with this chemotherapy include nausea, vomiting, myelosuppression, neutropenia, neutropenic sepsis, peripheral neuropathy, hair loss, pleural effusion and nail changes. The need for growth factor support was also discussed. The benefit would be decrease in his cancer volume and overall survival as discussed above.  After discussion today is agreeable to proceed after chemotherapy education class. I anticipate the start around 02/16/2015.  2. IV access: The risks and benefits of having Port-A-Cath insertion was reviewed. These complications including bleeding, thrombosis or infection were discussed and he is agreeable to proceed. The alternative would be used peripheral veins and complications associated with that include extravasation and skin irritation associated with repeated venipuncture. EMLA cream was prescribed to the patient as he has agreed o proceed.  3. Antiemetics: Prescription for Zofran was given to the patient with instructions how to use it.  4. Neutropenia prophylaxis: He will require Neulasta after each infusion. Complications associated that including arthralgias, myalgias and ways to combat that were reviewed. He will be instructed to use Claritin and pain medication for 24-48 hours after each injection.  5. Hormone therapy: He is status post bilateral orchiectomy and that should be sufficient for the time being.  6. Bone directed therapy:  He'll be an excellent candidate for Xgeva once he is up to his dental clearance. He was planned to have dental surgery which likely he will have that delayed for well.  7. Anemia: Likely related to chronic disease and malignancy. No growth factor support is needed at this time but certainly a possibility in the future.  8. Follow-up: Will be on 02/16/2015 start of chemotherapy.

## 2015-02-06 ENCOUNTER — Other Ambulatory Visit: Payer: Self-pay | Admitting: Radiology

## 2015-02-06 ENCOUNTER — Telehealth: Payer: Self-pay | Admitting: Oncology

## 2015-02-06 NOTE — Telephone Encounter (Signed)
Returned patients call to reschedule his chemo class

## 2015-02-07 ENCOUNTER — Other Ambulatory Visit: Payer: Medicare Other

## 2015-02-09 ENCOUNTER — Other Ambulatory Visit: Payer: Self-pay | Admitting: Oncology

## 2015-02-09 ENCOUNTER — Ambulatory Visit (HOSPITAL_COMMUNITY)
Admission: RE | Admit: 2015-02-09 | Discharge: 2015-02-09 | Disposition: A | Discharge status: Home / Self Care | Payer: Medicare Other | Source: Ambulatory Visit | Attending: Oncology | Admitting: Oncology

## 2015-02-09 ENCOUNTER — Other Ambulatory Visit: Payer: Medicare Other

## 2015-02-09 ENCOUNTER — Encounter (HOSPITAL_COMMUNITY): Payer: Self-pay

## 2015-02-09 DIAGNOSIS — C61 Malignant neoplasm of prostate: Secondary | ICD-10-CM | POA: Diagnosis not present

## 2015-02-09 DIAGNOSIS — Z79899 Other long term (current) drug therapy: Secondary | ICD-10-CM | POA: Insufficient documentation

## 2015-02-09 DIAGNOSIS — Z5111 Encounter for antineoplastic chemotherapy: Secondary | ICD-10-CM | POA: Diagnosis not present

## 2015-02-09 LAB — PROTIME-INR
INR: 1.06 (ref 0.00–1.49)
Prothrombin Time: 14 seconds (ref 11.6–15.2)

## 2015-02-09 LAB — BASIC METABOLIC PANEL
Anion gap: 8 (ref 5–15)
BUN: 18 mg/dL (ref 6–20)
CHLORIDE: 104 mmol/L (ref 101–111)
CO2: 24 mmol/L (ref 22–32)
Calcium: 9.1 mg/dL (ref 8.9–10.3)
Creatinine, Ser: 0.76 mg/dL (ref 0.61–1.24)
GFR calc Af Amer: >60 mL/min (ref >60)
GFR calc non Af Amer: >60 mL/min (ref >60)
Glucose, Bld: 96 mg/dL (ref 65–99)
POTASSIUM: 4.3 mmol/L (ref 3.5–5.1)
SODIUM: 136 mmol/L (ref 135–145)

## 2015-02-09 LAB — CBC
HEMATOCRIT: 37.4 % — AB (ref 39.0–52.0)
Hemoglobin: 12.1 g/dL — ABNORMAL LOW (ref 13.0–17.0)
MCH: 29 pg (ref 26.0–34.0)
MCHC: 32.4 g/dL (ref 30.0–36.0)
MCV: 89.7 fL (ref 78.0–100.0)
Platelets: 266 10*3/uL (ref 150–400)
RBC: 4.17 MIL/uL — ABNORMAL LOW (ref 4.22–5.81)
RDW: 15.9 % — AB (ref 11.5–15.5)
WBC: 6.5 10*3/uL (ref 4.0–10.5)

## 2015-02-09 LAB — APTT: aPTT: 30 seconds (ref 24–37)

## 2015-02-09 LAB — GLUCOSE, CAPILLARY
GLUCOSE-CAPILLARY: 66 mg/dL (ref 65–99)
Glucose-Capillary: 124 mg/dL — ABNORMAL HIGH (ref 65–99)

## 2015-02-09 MED ORDER — LIDOCAINE HCL 1 % IJ SOLN
INTRAMUSCULAR | Status: DC
Start: 2015-02-09 — End: 2015-02-10
  Filled 2015-02-09: qty 20

## 2015-02-09 MED ORDER — DEXTROSE 50 % IV SOLN
25.0000 mL | Freq: Once | INTRAVENOUS | Status: AC
  Administered 2015-02-09: 25 mL via INTRAVENOUS

## 2015-02-09 MED ORDER — FENTANYL CITRATE (PF) 100 MCG/2ML IJ SOLN
INTRAMUSCULAR | Status: AC | PRN
  Administered 2015-02-09: 25 ug via INTRAVENOUS
  Administered 2015-02-09: 50 ug via INTRAVENOUS
  Administered 2015-02-09: 25 ug via INTRAVENOUS

## 2015-02-09 MED ORDER — CEFAZOLIN SODIUM-DEXTROSE 2-3 GM-% IV SOLR
2.0000 g | INTRAVENOUS | Status: AC
  Administered 2015-02-09: 2 g via INTRAVENOUS

## 2015-02-09 MED ORDER — DEXTROSE 50 % IV SOLN
INTRAVENOUS | Status: AC
Start: 2015-02-09 — End: 2015-02-09
  Administered 2015-02-09: 25 mL via INTRAVENOUS
  Filled 2015-02-09: qty 50

## 2015-02-09 MED ORDER — CEFAZOLIN SODIUM-DEXTROSE 2-3 GM-% IV SOLR
INTRAVENOUS | Status: AC
  Filled 2015-02-09: qty 50

## 2015-02-09 MED ORDER — HEPARIN SOD (PORK) LOCK FLUSH 100 UNIT/ML IV SOLN
INTRAVENOUS | Status: AC | PRN
  Administered 2015-02-09: 500 [IU]

## 2015-02-09 MED ORDER — HEPARIN SOD (PORK) LOCK FLUSH 100 UNIT/ML IV SOLN
INTRAVENOUS | Status: AC
  Filled 2015-02-09: qty 5

## 2015-02-09 MED ORDER — SODIUM CHLORIDE 0.9 % IV SOLN
Freq: Once | INTRAVENOUS | Status: AC
  Administered 2015-02-09: 13:00:00 via INTRAVENOUS

## 2015-02-09 MED ORDER — MIDAZOLAM HCL 2 MG/2ML IJ SOLN
INTRAMUSCULAR | Status: DC
Start: 2015-02-09 — End: 2015-02-10
  Filled 2015-02-09: qty 4

## 2015-02-09 MED ORDER — MIDAZOLAM HCL 2 MG/2ML IJ SOLN
INTRAMUSCULAR | Status: AC | PRN
  Administered 2015-02-09: 0.5 mg via INTRAVENOUS
  Administered 2015-02-09 (×2): 1 mg via INTRAVENOUS

## 2015-02-09 MED ORDER — FENTANYL CITRATE (PF) 100 MCG/2ML IJ SOLN
INTRAMUSCULAR | Status: AC
  Filled 2015-02-09: qty 2

## 2015-02-09 NOTE — Procedures (Signed)
Interventional Radiology Procedure Note  Procedure: Placement of a right IJ approach single lumen PowerPort.  Tip is positioned at the superior cavoatrial junction and catheter is ready for immediate use.  Complications: No immediate Recommendations:  - Ok to shower tomorrow - Do not submerge for 7 days - Routine line care   Mecca Barga T. Virdia Ziesmer, M.D Pager:  [phone]   

## 2015-02-09 NOTE — Discharge Instructions (Signed)
Moderate Conscious Sedation, Adult °Sedation is the use of medicines to promote relaxation and relieve discomfort and anxiety. Moderate conscious sedation is a type of sedation. Under moderate conscious sedation you are less alert than normal but are still able to respond to instructions or stimulation. Moderate conscious sedation is used during short medical and dental procedures. It is milder than deep sedation or general anesthesia and allows you to return to your regular activities sooner. °LET YOUR HEALTH CARE PROVIDER KNOW ABOUT:  °· Any allergies you have. °· All medicines you are taking, including vitamins, herbs, eye drops, creams, and over-the-counter medicines. °· Use of steroids (by mouth or creams). °· Previous problems you or members of your family have had with the use of anesthetics. °· Any blood disorders you have. °· Previous surgeries you have had. °· Medical conditions you have. °· Possibility of pregnancy, if this applies. °· Use of cigarettes, alcohol, or illegal drugs. °RISKS AND COMPLICATIONS °Generally, this is a safe procedure. However, as with any procedure, problems can occur. Possible problems include: °· Oversedation. °· Trouble breathing on your own. You may need to have a breathing tube until you are awake and breathing on your own. °· Allergic reaction to any of the medicines used for the procedure. °BEFORE THE PROCEDURE °· You may have blood tests done. These tests can help show how well your kidneys and liver are working. They can also show how well your blood clots. °· A physical exam will be done.   °· Only take medicines as directed by your health care provider. You may need to stop taking medicines (such as blood thinners, aspirin, or nonsteroidal anti-inflammatory drugs) before the procedure.   °· Do not eat or drink at least 6 hours before the procedure or as directed by your health care provider. °· Arrange for a responsible adult, family member, or friend to take you home  after the procedure. He or she should stay with you for at least 24 hours after the procedure, until the medicine has worn off. °PROCEDURE  °· An intravenous (IV) catheter will be inserted into one of your veins. Medicine will be able to flow directly into your body through this catheter. You may be given medicine through this tube to help prevent pain and help you relax. °· The medical or dental procedure will be done. °AFTER THE PROCEDURE °· You will stay in a recovery area until the medicine has worn off. Your blood pressure and pulse will be checked.   °·  Depending on the procedure you had, you may be allowed to go home when you can tolerate liquids and your pain is under control. °  °This information is not intended to replace advice given to you by your health care provider. Make sure you discuss any questions you have with your health care provider. °  °Document Released: 11/12/2000 Document Revised: 03/10/2014 Document Reviewed: 10/25/2012 °Elsevier Interactive Patient Education ©2016 [employer] °Implanted Port Insertion, Care After °Refer to this sheet in the next few weeks. These instructions provide you with information on caring for yourself after your procedure. Your health care provider may also give you more specific instructions. Your treatment has been planned according to current medical practices, but problems sometimes occur. Call your health care provider if you have any problems or questions after your procedure. °WHAT TO EXPECT AFTER THE PROCEDURE °After your procedure, it is typical to have the following:  °· Discomfort at the port insertion site. Ice packs to the area will help. °· Bruising   on the skin over the port. This will subside in 3-4 days. °HOME CARE INSTRUCTIONS °· After your port is placed, you will get a manufacturer's information card. The card has information about your port. Keep this card with you at all times.   °· Know what kind of port you have. There are many types of  ports available.   °· Wear a medical alert bracelet in case of an emergency. This can help alert health care workers that you have a port.   °· The port can stay in for as long as your health care provider believes it is necessary.   °· A home health care nurse may give medicines and take care of the port.   °· You or a family member can get special training and directions for giving medicine and taking care of the port at home.   °SEEK MEDICAL CARE IF:  °· Your port does not flush or you are unable to get a blood return.   °· You have a fever or chills. °SEEK IMMEDIATE MEDICAL CARE IF: °· You have new fluid or pus coming from your incision.   °· You notice a bad smell coming from your incision site.   °· You have swelling, pain, or more redness at the incision or port site.   °· You have chest pain or shortness of breath. °  °This information is not intended to replace advice given to you by your health care provider. Make sure you discuss any questions you have with your health care provider. °  °Document Released: 12/08/2012 Document Revised: 02/22/2013 Document Reviewed: 12/08/2012 °Elsevier Interactive Patient Education ©2016 [employer] °Implanted Port Home Guide °An implanted port is a type of central line that is placed under the skin. Central lines are used to provide IV access when treatment or nutrition needs to be given through a person's veins. Implanted ports are used for long-term IV access. An implanted port may be placed because:  °· You need IV medicine that would be irritating to the small veins in your hands or arms.   °· You need long-term IV medicines, such as antibiotics.   °· You need IV nutrition for a long period.   °· You need frequent blood draws for lab tests.   °· You need dialysis.   °Implanted ports are usually placed in the chest area, but they can also be placed in the upper arm, the abdomen, or the leg. An implanted port has two main parts:  °· Reservoir. The reservoir is round  and will appear as a small, raised area under your skin. The reservoir is the part where a needle is inserted to give medicines or draw blood.   °· Catheter. The catheter is a thin, flexible tube that extends from the reservoir. The catheter is placed into a large vein. Medicine that is inserted into the reservoir goes into the catheter and then into the vein.   °HOW WILL I CARE FOR MY INCISION SITE? °Do not get the incision site wet. Bathe or shower as directed by your health care provider.  °HOW IS MY PORT ACCESSED? °Special steps must be taken to access the port:  °· Before the port is accessed, a numbing cream can be placed on the skin. This helps numb the skin over the port site.   °· Your health care provider uses a sterile technique to access the port. °· Your health care provider must put on a mask and sterile gloves. °· The skin over your port is cleaned carefully with an antiseptic and allowed to dry. °· The port   is gently pinched between sterile gloves, and a needle is inserted into the port. °· Only "non-coring" port needles should be used to access the port. Once the port is accessed, a blood return should be checked. This helps ensure that the port is in the vein and is not clogged.   °· If your port needs to remain accessed for a constant infusion, a clear (transparent) bandage will be placed over the needle site. The bandage and needle will need to be changed every week, or as directed by your health care provider.   °· Keep the bandage covering the needle clean and dry. Do not get it wet. Follow your health care provider's instructions on how to take a shower or bath while the port is accessed.   °· If your port does not need to stay accessed, no bandage is needed over the port.   °WHAT IS FLUSHING? °Flushing helps keep the port from getting clogged. Follow your health care provider's instructions on how and when to flush the port. Ports are usually flushed with saline solution or a medicine called  heparin. The need for flushing will depend on how the port is used.  °· If the port is used for intermittent medicines or blood draws, the port will need to be flushed:   °· After medicines have been given.   °· After blood has been drawn.   °· As part of routine maintenance.   °· If a constant infusion is running, the port may not need to be flushed.   °HOW LONG WILL MY PORT STAY IMPLANTED? °The port can stay in for as long as your health care provider thinks it is needed. When it is time for the port to come out, surgery will be done to remove it. The procedure is similar to the one performed when the port was put in.  °WHEN SHOULD I SEEK IMMEDIATE MEDICAL CARE? °When you have an implanted port, you should seek immediate medical care if:  °· You notice a bad smell coming from the incision site.   °· You have swelling, redness, or drainage at the incision site.   °· You have more swelling or pain at the port site or the surrounding area.   °· You have a fever that is not controlled with medicine. °  °This information is not intended to replace advice given to you by your health care provider. Make sure you discuss any questions you have with your health care provider. °  °Document Released: 02/17/2005 Document Revised: 12/08/2012 Document Reviewed: 10/25/2012 °Elsevier Interactive Patient Education ©2016 [employer]. ° °

## 2015-02-09 NOTE — H&P (Signed)
HPI: Patient with advanced prostate cancer who has been seen by Dr. Alen Blew on 02/02/15 and scheduled today for image guided port a catheter placement.  The patient has had a H&P performed within the last 30 days, all history, medications, and exam have been reviewed. The patient denies any interval changes since the H&P.  Medications: Prior to Admission medications   Medication Sig Start Date End Date Taking? Authorizing Provider  BIOTIN 5000 PO Take 1 tablet by mouth daily. 02/02/15  Yes Historical Provider, MD  Calcium Carbonate-Vitamin D3 (CALCIUM 600+D3) 600-400 MG-UNIT TABS Take 1 tablet by mouth daily. 02/02/15  Yes Historical Provider, MD  fexofenadine (ALLEGRA) 180 MG tablet Take 180 mg by mouth daily.   Yes Historical Provider, MD  fluticasone (FLONASE) 50 MCG/ACT nasal spray Place 1 spray into both nostrils daily as needed for allergies or rhinitis.   Yes Historical Provider, MD  hydrOXYzine (ATARAX/VISTARIL) 10 MG tablet Take 10 mg by mouth every 8 (eight) hours. 11/27/14  Yes Historical Provider, MD  Multiple Vitamin (MULTIVITAMIN WITH MINERALS) TABS tablet Take 1 tablet by mouth daily.   Yes Historical Provider, MD  Glycerin-Polysorbate 80 (REFRESH DRY EYE THERAPY OP) Apply 1 drop to eye daily.    Historical Provider, MD  halobetasol (ULTRAVATE) 0.05 % ointment Apply 1 application topically as needed. For exzema. 02/02/15   Historical Provider, MD  lidocaine-prilocaine (EMLA) cream Apply 1 application topically as needed. Apply to portacath before each chemotherapy. 02/02/15   [name], MD  ondansetron (ZOFRAN) 4 MG tablet Take 1 tablet (4 mg total) by mouth every 8 (eight) hours as needed for nausea or vomiting. 02/02/15   [name], MD  terbinafine (LAMISIL) 1 % cream Apply 1 application topically 2 (two) times daily. 02/02/15   Historical Provider, MD  tretinoin (RETIN-A) 0.1 % cream Apply 1 application topically at bedtime. 02/02/15   Historical Provider, MD     Vital  Signs: BP 162/87 mmHg  Pulse 86  Temp(Src) 98.1 F (36.7 C) (Oral)  Resp 18  SpO2 100%  Physical Exam  Constitutional: He is oriented to person, place, and time. No distress.  HENT:  Head: Normocephalic and atraumatic.  Cardiovascular: Normal rate and regular rhythm.  Exam reveals no gallop and no friction rub.   No murmur heard. Pulmonary/Chest: Effort normal and breath sounds normal. No respiratory distress. He has no wheezes. He has no rales.  Abdominal: Soft. Bowel sounds are normal. He exhibits no distension. There is no tenderness.  Neurological: He is alert and oriented to person, place, and time.  Skin: Skin is warm. He is not diaphoretic.    Mallampati Score:  MD Evaluation Airway: WNL Heart: WNL Abdomen: WNL Chest/ Lungs: WNL ASA  Classification: 2 Mallampati/Airway Score: Two  Labs:  CBC:  Recent Labs  12/27/14 1200 02/09/15 1305  WBC 7.6 6.5  HGB 11.8* 12.1*  HCT 37.4* 37.4*  PLT 341 266    COAGS:  Recent Labs  12/27/14 1200 02/09/15 1305  INR 1.10 1.06  APTT 29 30    BMP:  Recent Labs  02/09/15 1305  NA 136  K 4.3  CL 104  CO2 24  GLUCOSE 96  BUN 18  CALCIUM 9.1  CREATININE 0.76  GFRNONAA >60  GFRAA >60    Assessment/Plan:  Advanced prostate cancer Seen by Dr. Alen Blew on 02/02/15 Scheduled today for image guided port a catheter placement with sedation The patient has been NPO, no blood thinners taken, labs and vitals have  been reviewed. Risks and Benefits discussed with the patient including, but not limited to bleeding, infection, pneumothorax, or fibrin sheath development and need for additional procedures. All of the patient's questions were answered, patient is agreeable to proceed. Consent signed and in chart.   SignedHedy Jacob 02/09/2015, 2:51 PM

## 2015-02-09 NOTE — Progress Notes (Signed)
CRITICAL VALUE ALERT  Critical value received:  cbg 60  Date of notification:  02/09/15  Time of notification:  [id number]  Critical value read back:Yes.    Nurse who received alert:  Jonna Clark RN   MD notified (1st page):  Tsosie Billing PA   Time of first page:  1520  MD notified (2nd page):  Time of second page:  Responding MD:    Time MD responded:    Follow up CBG done in IR dept at 1535 and increased to 125 per [facility]. Dr. Kathlene Cote made aware.

## 2015-02-09 NOTE — Progress Notes (Signed)
Called S.Collins,RN reporting "feeling shakey" after NPO preprocedure.  Shelia Media notified C. Lilia Pro, [place]. See order / administration  dextrose IV. Bedside report of previous to Carron Brazen RN, IR as Pt transported to IR for insertion of port.

## 2015-02-12 ENCOUNTER — Other Ambulatory Visit: Payer: Medicare Other

## 2015-02-16 ENCOUNTER — Ambulatory Visit (HOSPITAL_BASED_OUTPATIENT_CLINIC_OR_DEPARTMENT_OTHER): Payer: Medicare Other | Admitting: Oncology

## 2015-02-16 ENCOUNTER — Telehealth: Payer: Self-pay | Admitting: Oncology

## 2015-02-16 ENCOUNTER — Ambulatory Visit (HOSPITAL_BASED_OUTPATIENT_CLINIC_OR_DEPARTMENT_OTHER): Payer: Medicare Other

## 2015-02-16 ENCOUNTER — Other Ambulatory Visit (HOSPITAL_BASED_OUTPATIENT_CLINIC_OR_DEPARTMENT_OTHER): Payer: Medicare Other

## 2015-02-16 VITALS — BP 151/78 | HR 82 | Temp 97.9°F | Resp 18

## 2015-02-16 VITALS — BP 143/71 | HR 71 | Temp 98.2°F | Resp 16 | Ht 76.0 in | Wt 197.4 lb

## 2015-02-16 DIAGNOSIS — C7951 Secondary malignant neoplasm of bone: Secondary | ICD-10-CM

## 2015-02-16 DIAGNOSIS — C61 Malignant neoplasm of prostate: Secondary | ICD-10-CM

## 2015-02-16 DIAGNOSIS — Z5111 Encounter for antineoplastic chemotherapy: Secondary | ICD-10-CM | POA: Diagnosis not present

## 2015-02-16 DIAGNOSIS — D649 Anemia, unspecified: Secondary | ICD-10-CM

## 2015-02-16 LAB — COMPREHENSIVE METABOLIC PANEL
ALBUMIN: 3.9 g/dL (ref 3.5–5.0)
ALK PHOS: 718 U/L — AB (ref 40–150)
ALT: 19 U/L (ref 0–55)
AST: 19 U/L (ref 5–34)
Anion Gap: 7 mEq/L (ref 3–11)
BUN: 21.4 mg/dL (ref 7.0–26.0)
CHLORIDE: 102 meq/L (ref 98–109)
CO2: 26 mEq/L (ref 22–29)
CREATININE: 0.9 mg/dL (ref 0.7–1.3)
Calcium: 9.6 mg/dL (ref 8.4–10.4)
EGFR: 86 mL/min/{1.73_m2} — ABNORMAL LOW (ref >90)
GLUCOSE: 81 mg/dL (ref 70–140)
POTASSIUM: 4.5 meq/L (ref 3.5–5.1)
SODIUM: 136 meq/L (ref 136–145)
Total Bilirubin: 0.5 mg/dL (ref 0.20–1.20)
Total Protein: 7 g/dL (ref 6.4–8.3)

## 2015-02-16 LAB — CBC WITH DIFFERENTIAL/PLATELET
BASO%: 0.7 % (ref 0.0–2.0)
BASOS ABS: 0 10*3/uL (ref 0.0–0.1)
EOS%: 1.1 % (ref 0.0–7.0)
Eosinophils Absolute: 0.1 10*3/uL (ref 0.0–0.5)
HEMATOCRIT: 38.9 % (ref 38.4–49.9)
HEMOGLOBIN: 12.7 g/dL — AB (ref 13.0–17.1)
LYMPH#: 1.9 10*3/uL (ref 0.9–3.3)
LYMPH%: 33.2 % (ref 14.0–49.0)
MCH: 28.8 pg (ref 27.2–33.4)
MCHC: 32.7 g/dL (ref 32.0–36.0)
MCV: 88.1 fL (ref 79.3–98.0)
MONO#: 0.6 10*3/uL (ref 0.1–0.9)
MONO%: 10.1 % (ref 0.0–14.0)
NEUT%: 54.9 % (ref 39.0–75.0)
NEUTROS ABS: 3.1 10*3/uL (ref 1.5–6.5)
Platelets: 214 10*3/uL (ref 140–400)
RBC: 4.41 10*6/uL (ref 4.20–5.82)
RDW: 16.4 % — AB (ref 11.0–14.6)
WBC: 5.7 10*3/uL (ref 4.0–10.3)

## 2015-02-16 MED ORDER — SODIUM CHLORIDE 0.9 % IV SOLN
Freq: Once | INTRAVENOUS | Status: AC
  Administered 2015-02-16: 10:00:00 via INTRAVENOUS

## 2015-02-16 MED ORDER — SODIUM CHLORIDE 0.9 % IV SOLN
Freq: Once | INTRAVENOUS | Status: AC
  Administered 2015-02-16: 10:00:00 via INTRAVENOUS
  Filled 2015-02-16: qty 4

## 2015-02-16 MED ORDER — DOCETAXEL CHEMO INJECTION 160 MG/16ML
75.0000 mg/m2 | Freq: Once | INTRAVENOUS | Status: AC
  Administered 2015-02-16: 160 mg via INTRAVENOUS
  Filled 2015-02-16: qty 16

## 2015-02-16 MED ORDER — SODIUM CHLORIDE 0.9 % IJ SOLN
10.0000 mL | INTRAMUSCULAR | Status: DC | PRN
  Administered 2015-02-16: 10 mL
  Filled 2015-02-16: qty 10

## 2015-02-16 MED ORDER — HEPARIN SOD (PORK) LOCK FLUSH 100 UNIT/ML IV SOLN
500.0000 [IU] | Freq: Once | INTRAVENOUS | Status: AC | PRN
  Administered 2015-02-16: 500 [IU]
  Filled 2015-02-16: qty 5

## 2015-02-16 NOTE — Telephone Encounter (Signed)
Gave and printed appt sched and avs for pt; for DEC and Jan  °

## 2015-02-16 NOTE — Patient Instructions (Signed)
[facility] Discharge Instructions for Patients Receiving Chemotherapy  Today you received the following chemotherapy agents TAXOTERE To help prevent nausea and vomiting after your treatment, we encourage you to take your nausea medication as prescribed.   If you develop nausea and vomiting that is not controlled by your nausea medication, call the clinic.   BELOW ARE SYMPTOMS THAT SHOULD BE REPORTED IMMEDIATELY:  *FEVER GREATER THAN 100.5 F  *CHILLS WITH OR WITHOUT FEVER  NAUSEA AND VOMITING THAT IS NOT CONTROLLED WITH YOUR NAUSEA MEDICATION  *UNUSUAL SHORTNESS OF BREATH  *UNUSUAL BRUISING OR BLEEDING  TENDERNESS IN MOUTH AND THROAT WITH OR WITHOUT PRESENCE OF ULCERS  *URINARY PROBLEMS  *BOWEL PROBLEMS  UNUSUAL RASH Items with * indicate a potential emergency and should be followed up as soon as possible.  Feel free to call the clinic you have any questions or concerns. The clinic phone number is [phone].  Please show the CHEMO ALERT CARD at check-in to the Emergency Department and triage nurse.   

## 2015-02-16 NOTE — Progress Notes (Signed)
Hematology and Oncology Follow Up Visit  Philip [name] [id number] Jul 25, 1945 69 y.o. 02/16/2015 8:44 AM No PCP Per PatientNo ref. provider found   Principle Diagnosis: 69 year old gentleman with advanced prostate cancer diagnosed in October 2016. He presented with a PSA of 2900 and widespread bone metastasis as well as pelvic adenopathy.   Prior Therapy: Status post orchiectomy done on November 2016. Status post port placement on 02/09/2015.  Current therapy: Taxotere chemotherapy at 75 mg/m to start on 02/16/2015. He is here for cycle 1 out of planned 6.  Interim History:  [name] presents today for a follow-up visit. Since the last visit, he reports no recent complaints. He had a Port-A-Cath placed without any complications. He also attended chemotherapy education class and preparation to start chemotherapy today. He reported lower extremity edema have improved since the last visit. He continues to be quite functional without any Caryl Comes is his health. He does not report any bone pain or back pain. Does not report any recent illnesses or hospitalizations.  He does not report any headaches, blurry vision, syncope or seizures. He does not report any fevers, chills, sweats or weight loss. He does not report any chest pain, palpitation, orthopnea. Does not report any cough, wheezing, hemoptysis or dyspnea on exertion. He does not report any nausea, vomiting, abdominal pain, hematochezia. He does not report any frequency, urgency or hesitancy. Does not report any hematuria or dysuria. He does not report any arthralgias, myalgias. Does not report any lymphadenopathy or petechiae. Does not report any change in his mood including depression or anxiety. Remaining review of systems unremarkable  Medications: I have reviewed the patient's current medications.   Current Outpatient Prescriptions  Medication Sig Dispense Refill  . BIOTIN 5000 PO Take 1 tablet by mouth daily.    . Calcium Carbonate-Vitamin  D3 (CALCIUM 600+D3) 600-400 MG-UNIT TABS Take 1 tablet by mouth daily.    . fexofenadine (ALLEGRA) 180 MG tablet Take 180 mg by mouth daily.    . fluticasone (FLONASE) 50 MCG/ACT nasal spray Place 1 spray into both nostrils daily as needed for allergies or rhinitis.    . Glycerin-Polysorbate 80 (REFRESH DRY EYE THERAPY OP) Apply 1 drop to eye daily.    . halobetasol (ULTRAVATE) 0.05 % ointment Apply 1 application topically as needed. For exzema.    . hydrOXYzine (ATARAX/VISTARIL) 10 MG tablet Take 10 mg by mouth every 8 (eight) hours.  4  . lidocaine-prilocaine (EMLA) cream Apply 1 application topically as needed. Apply to portacath before each chemotherapy. 30 g 0  . Multiple Vitamin (MULTIVITAMIN WITH MINERALS) TABS tablet Take 1 tablet by mouth daily.    . ondansetron (ZOFRAN) 4 MG tablet Take 1 tablet (4 mg total) by mouth every 8 (eight) hours as needed for nausea or vomiting. 20 tablet 0  . terbinafine (LAMISIL) 1 % cream Apply 1 application topically 2 (two) times daily.    Marland Kitchen tretinoin (RETIN-A) 0.1 % cream Apply 1 application topically at bedtime.     No current facility-administered medications for this visit.     Allergies: No Known Allergies  Past Medical History, Surgical history, Social history, and Family History were reviewed and updated.   Physical Exam: Blood pressure 143/71, pulse 71, temperature 98.2 F (36.8 C), temperature source Oral, resp. rate 16, height 6\' 4"  (1.93 m), weight 197 lb 6.4 oz (89.54 kg), SpO2 100 %. ECOG: 0 General appearance: alert and cooperative Head: Normocephalic, without obvious abnormality Neck: no adenopathy Lymph nodes: Cervical, supraclavicular, and axillary  nodes normal. Heart:regular rate and rhythm, S1, S2 normal, no murmur, click, rub or gallop Lung:chest clear, no wheezing, rales, normal symmetric air entry Abdomin: soft, non-tender, without masses or organomegaly EXT:no erythema, induration, or nodules   Lab Results: Lab  Results  Component Value Date   WBC 5.7 02/16/2015   HGB 12.7* 02/16/2015   HCT 38.9 02/16/2015   MCV 88.1 02/16/2015   PLT 214 02/16/2015     Chemistry      Component Value Date/Time   NA 136 02/09/2015 1305   K 4.3 02/09/2015 1305   CL 104 02/09/2015 1305   CO2 24 02/09/2015 1305   BUN 18 02/09/2015 1305   CREATININE 0.76 02/09/2015 1305      Component Value Date/Time   CALCIUM 9.1 02/09/2015 1305       Impression and Plan:  69 year old gentleman with the following issues:  1. Advanced prostate cancer diagnosed in October 2016. He presented with PSA of 2900 and unknown Gleason score. His disease includes widespread bony metastasis as well as pelvic adenopathy. He is status post bilateral orchiectomy done in November 2016.  He is to start Taxotere at 75 mg/m with cycle 1 on 02/16/2015 for a total of 6 cycles. All his questions were answered and he is ready to proceed. The goal of this therapy still palliative but certainly will improve his overall disease control long-term and overall survival.  2. IV access: Port-A-Cath inserted without any complications. EMLA cream available to the patient.  3. Antiemetics: Prescription for Zofran was given to the patient with instructions how to use it.  4. Neutropenia prophylaxis: He will require Neulasta after each infusion. Complications associated that including arthralgias, myalgias and ways to combat that were reviewed. He will be instructed to use Claritin and pain medication with each injection.  5. Hormone therapy: He is status post bilateral orchiectomy and that should be sufficient for the time being.  6. Bone directed therapy: He'll be an excellent candidate for Xgeva once he is up to his dental clearance. He was planned to have dental surgery which likely he will have that delayed for well.  7. Anemia: Likely related to chronic disease and malignancy. Continue to monitor moving forward.  8. Follow-up: In 3 weeks for the  next cycle of chemotherapy.    [facility], MD 12/16/20168:44 AM

## 2015-02-17 LAB — PSA: PSA: 47.09 ng/mL — ABNORMAL HIGH (ref <=4.00)

## 2015-02-19 ENCOUNTER — Ambulatory Visit (HOSPITAL_BASED_OUTPATIENT_CLINIC_OR_DEPARTMENT_OTHER): Payer: Medicare Other

## 2015-02-19 VITALS — BP 142/78 | HR 105 | Temp 98.6°F

## 2015-02-19 DIAGNOSIS — C61 Malignant neoplasm of prostate: Secondary | ICD-10-CM | POA: Diagnosis present

## 2015-02-19 MED ORDER — PEGFILGRASTIM INJECTION 6 MG/0.6ML ~~LOC~~
6.0000 mg | PREFILLED_SYRINGE | Freq: Once | SUBCUTANEOUS | Status: AC
  Administered 2015-02-19: 6 mg via SUBCUTANEOUS
  Filled 2015-02-19: qty 0.6

## 2015-02-19 NOTE — Patient Instructions (Signed)
Pegfilgrastim injection What is this medicine? PEGFILGRASTIM (PEG fil gra stim) is a long-acting granulocyte colony-stimulating factor that stimulates the growth of neutrophils, a type of white blood cell important in the body's fight against infection. It is used to reduce the incidence of fever and infection in patients with certain types of cancer who are receiving chemotherapy that affects the bone marrow, and to increase survival after being exposed to high doses of radiation. This medicine may be used for other purposes; ask your health care provider or pharmacist if you have questions. What should I tell my health care provider before I take this medicine? They need to know if you have any of these conditions: -kidney disease -latex allergy -ongoing radiation therapy -sickle cell disease -skin reactions to acrylic adhesives (On-Body Injector only) -an unusual or allergic reaction to pegfilgrastim, filgrastim, other medicines, foods, dyes, or preservatives -pregnant or trying to get pregnant -breast-feeding How should I use this medicine? This medicine is for injection under the skin. If you get this medicine at home, you will be taught how to prepare and give the pre-filled syringe or how to use the On-body Injector. Refer to the patient Instructions for Use for detailed instructions. Use exactly as directed. Take your medicine at regular intervals. Do not take your medicine more often than directed. It is important that you put your used needles and syringes in a special sharps container. Do not put them in a trash can. If you do not have a sharps container, call your pharmacist or healthcare provider to get one. Talk to your pediatrician regarding the use of this medicine in children. While this drug may be prescribed for selected conditions, precautions do apply. Overdosage: If you think you have taken too much of this medicine contact a poison control center or emergency room at  once. NOTE: This medicine is only for you. Do not share this medicine with others. What if I miss a dose? It is important not to miss your dose. Call your doctor or health care professional if you miss your dose. If you miss a dose due to an On-body Injector failure or leakage, a new dose should be administered as soon as possible using a single prefilled syringe for manual use. What may interact with this medicine? Interactions have not been studied. Give your health care provider a list of all the medicines, herbs, non-prescription drugs, or dietary supplements you use. Also tell them if you smoke, drink alcohol, or use illegal drugs. Some items may interact with your medicine. This list may not describe all possible interactions. Give your health care provider a list of all the medicines, herbs, non-prescription drugs, or dietary supplements you use. Also tell them if you smoke, drink alcohol, or use illegal drugs. Some items may interact with your medicine. What should I watch for while using this medicine? You may need blood work done while you are taking this medicine. If you are going to need a MRI, CT scan, or other procedure, tell your doctor that you are using this medicine (On-Body Injector only). What side effects may I notice from receiving this medicine? Side effects that you should report to your doctor or health care professional as soon as possible: -allergic reactions like skin rash, itching or hives, swelling of the face, lips, or tongue -dizziness -fever -pain, redness, or irritation at site where injected -pinpoint red spots on the skin -red or dark-brown urine -shortness of breath or breathing problems -stomach or side pain, or pain   at the shoulder -swelling -tiredness -trouble passing urine or change in the amount of urine Side effects that usually do not require medical attention (report to your doctor or health care professional if they continue or are  bothersome): -bone pain -muscle pain This list may not describe all possible side effects. Call your doctor for medical advice about side effects. You may report side effects to FDA at 1-800-FDA-1088. Where should I keep my medicine? Keep out of the reach of children. Store pre-filled syringes in a refrigerator between 2 and 8 degrees C (36 and 46 degrees F). Do not freeze. Keep in carton to protect from light. Throw away this medicine if it is left out of the refrigerator for more than 48 hours. Throw away any unused medicine after the expiration date. NOTE: This sheet is a summary. It may not cover all possible information. If you have questions about this medicine, talk to your doctor, pharmacist, or health care provider.    2016, Elsevier/Gold Standard. (2014-03-09 14:30:14)  

## 2015-03-01 DIAGNOSIS — C61 Malignant neoplasm of prostate: Secondary | ICD-10-CM | POA: Diagnosis not present

## 2015-03-08 DIAGNOSIS — N1330 Unspecified hydronephrosis: Secondary | ICD-10-CM | POA: Diagnosis not present

## 2015-03-08 DIAGNOSIS — Z0000 Encounter for general adult medical examination without abnormal findings: Secondary | ICD-10-CM | POA: Diagnosis not present

## 2015-03-08 DIAGNOSIS — C61 Malignant neoplasm of prostate: Secondary | ICD-10-CM | POA: Diagnosis not present

## 2015-03-09 ENCOUNTER — Ambulatory Visit (HOSPITAL_BASED_OUTPATIENT_CLINIC_OR_DEPARTMENT_OTHER): Payer: Medicare Other

## 2015-03-09 ENCOUNTER — Telehealth: Payer: Self-pay | Admitting: *Deleted

## 2015-03-09 ENCOUNTER — Ambulatory Visit: Payer: Medicare Other

## 2015-03-09 ENCOUNTER — Ambulatory Visit (HOSPITAL_BASED_OUTPATIENT_CLINIC_OR_DEPARTMENT_OTHER): Payer: Medicare Other | Admitting: Oncology

## 2015-03-09 ENCOUNTER — Other Ambulatory Visit (HOSPITAL_BASED_OUTPATIENT_CLINIC_OR_DEPARTMENT_OTHER): Payer: Medicare Other

## 2015-03-09 ENCOUNTER — Encounter: Payer: Self-pay | Admitting: *Deleted

## 2015-03-09 ENCOUNTER — Telehealth: Payer: Self-pay | Admitting: Oncology

## 2015-03-09 VITALS — BP 131/70 | HR 88 | Temp 98.1°F | Resp 18 | Ht 76.0 in | Wt 202.6 lb

## 2015-03-09 DIAGNOSIS — C61 Malignant neoplasm of prostate: Secondary | ICD-10-CM

## 2015-03-09 DIAGNOSIS — D649 Anemia, unspecified: Secondary | ICD-10-CM | POA: Diagnosis not present

## 2015-03-09 DIAGNOSIS — C7951 Secondary malignant neoplasm of bone: Secondary | ICD-10-CM | POA: Diagnosis not present

## 2015-03-09 DIAGNOSIS — Z5111 Encounter for antineoplastic chemotherapy: Secondary | ICD-10-CM

## 2015-03-09 DIAGNOSIS — R599 Enlarged lymph nodes, unspecified: Secondary | ICD-10-CM

## 2015-03-09 DIAGNOSIS — Z95828 Presence of other vascular implants and grafts: Secondary | ICD-10-CM

## 2015-03-09 LAB — COMPREHENSIVE METABOLIC PANEL
ALT: 16 U/L (ref 0–55)
ANION GAP: 8 meq/L (ref 3–11)
AST: 16 U/L (ref 5–34)
Albumin: 3.4 g/dL — ABNORMAL LOW (ref 3.5–5.0)
Alkaline Phosphatase: 335 U/L — ABNORMAL HIGH (ref 40–150)
BILIRUBIN TOTAL: 0.4 mg/dL (ref 0.20–1.20)
BUN: 15.4 mg/dL (ref 7.0–26.0)
CALCIUM: 9 mg/dL (ref 8.4–10.4)
CHLORIDE: 103 meq/L (ref 98–109)
CO2: 24 mEq/L (ref 22–29)
CREATININE: 0.8 mg/dL (ref 0.7–1.3)
Glucose: 97 mg/dl (ref 70–140)
Potassium: 4.4 mEq/L (ref 3.5–5.1)
Sodium: 135 mEq/L — ABNORMAL LOW (ref 136–145)
TOTAL PROTEIN: 6.5 g/dL (ref 6.4–8.3)

## 2015-03-09 LAB — CBC WITH DIFFERENTIAL/PLATELET
BASO%: 0.2 % (ref 0.0–2.0)
BASOS ABS: 0 10*3/uL (ref 0.0–0.1)
EOS ABS: 0 10*3/uL (ref 0.0–0.5)
EOS%: 0.1 % (ref 0.0–7.0)
HEMATOCRIT: 33.9 % — AB (ref 38.4–49.9)
HGB: 11 g/dL — ABNORMAL LOW (ref 13.0–17.1)
LYMPH#: 1.5 10*3/uL (ref 0.9–3.3)
LYMPH%: 11.5 % — AB (ref 14.0–49.0)
MCH: 29.5 pg (ref 27.2–33.4)
MCHC: 32.4 g/dL (ref 32.0–36.0)
MCV: 90.9 fL (ref 79.3–98.0)
MONO#: 1 10*3/uL — AB (ref 0.1–0.9)
MONO%: 7.6 % (ref 0.0–14.0)
NEUT#: 10.2 10*3/uL — ABNORMAL HIGH (ref 1.5–6.5)
NEUT%: 80.6 % — AB (ref 39.0–75.0)
PLATELETS: 362 10*3/uL (ref 140–400)
RBC: 3.73 10*6/uL — AB (ref 4.20–5.82)
RDW: 16.5 % — ABNORMAL HIGH (ref 11.0–14.6)
WBC: 12.7 10*3/uL — ABNORMAL HIGH (ref 4.0–10.3)

## 2015-03-09 MED ORDER — SODIUM CHLORIDE 0.9 % IV SOLN
Freq: Once | INTRAVENOUS | Status: AC
  Administered 2015-03-09: 09:00:00 via INTRAVENOUS
  Filled 2015-03-09: qty 4

## 2015-03-09 MED ORDER — DOCETAXEL CHEMO INJECTION 160 MG/16ML
75.0000 mg/m2 | Freq: Once | INTRAVENOUS | Status: AC
  Administered 2015-03-09: 160 mg via INTRAVENOUS
  Filled 2015-03-09: qty 16

## 2015-03-09 MED ORDER — SODIUM CHLORIDE 0.9 % IJ SOLN
10.0000 mL | INTRAMUSCULAR | Status: DC | PRN
  Administered 2015-03-09: 10 mL via INTRAVENOUS
  Filled 2015-03-09: qty 10

## 2015-03-09 MED ORDER — SODIUM CHLORIDE 0.9 % IV SOLN
Freq: Once | INTRAVENOUS | Status: AC
  Administered 2015-03-09: 09:00:00 via INTRAVENOUS

## 2015-03-09 MED ORDER — HEPARIN SOD (PORK) LOCK FLUSH 100 UNIT/ML IV SOLN
500.0000 [IU] | Freq: Once | INTRAVENOUS | Status: AC | PRN
  Administered 2015-03-09: 500 [IU]
  Filled 2015-03-09: qty 5

## 2015-03-09 MED ORDER — SODIUM CHLORIDE 0.9 % IJ SOLN
10.0000 mL | INTRAMUSCULAR | Status: DC | PRN
  Administered 2015-03-09: 10 mL
  Filled 2015-03-09: qty 10

## 2015-03-09 NOTE — Telephone Encounter (Signed)
per pof to sch pt appt-sent MW email to sch trmt-gave pt copy of avs °

## 2015-03-09 NOTE — Progress Notes (Signed)
Hematology and Oncology Follow Up Visit  Philip [name] [id number] 01/07/1946 70 y.o. 03/09/2015 8:35 AM No PCP Per PatientNo ref. provider found   Principle Diagnosis: 70 year old gentleman with advanced prostate cancer diagnosed in October 2016. He presented with a PSA of 2900 and widespread bone metastasis as well as pelvic adenopathy.    Prior Therapy: Status post orchiectomy done on November 2016. Status post port placement on 02/09/2015.  Current therapy: Taxotere chemotherapy at 75 mg/m to start on 02/16/2015. He is here for cycle 2 out of planned 6.  Interim History:  Philip [name] presents today for a follow-up visit. Since the last visit, he received his first cycle of chemotherapy and tolerated it well. He denied any nausea, abdominal pain or any GI complications. He denied any infusion-related issues or peripheral neuropathy. He does not report any bone pain or back pain. Does not report any recent illnesses or hospitalizations. His lower extremity swelling have nearly resolved at this time.  He does not report any headaches, blurry vision, syncope or seizures. He does not report any fevers, chills, sweats or weight loss. He does not report any chest pain, palpitation, orthopnea. Does not report any cough, wheezing, hemoptysis or dyspnea on exertion. He does not report any nausea, vomiting, abdominal pain, hematochezia. He does not report any frequency, urgency or hesitancy. Does not report any hematuria or dysuria. He does not report any arthralgias, myalgias. Does not report any lymphadenopathy or petechiae. Does not report any change in his mood including depression or anxiety. Remaining review of systems unremarkable  Medications: I have reviewed the patient's current medications.   Current Outpatient Prescriptions  Medication Sig Dispense Refill  . BIOTIN 5000 PO Take 1 tablet by mouth daily.    . Calcium Carbonate-Vitamin D3 (CALCIUM 600+D3) 600-400 MG-UNIT TABS Take 1 tablet by mouth  daily.    . fexofenadine (ALLEGRA) 180 MG tablet Take 180 mg by mouth daily.    . fluticasone (FLONASE) 50 MCG/ACT nasal spray Place 1 spray into both nostrils daily as needed for allergies or rhinitis.    . Glycerin-Polysorbate 80 (REFRESH DRY EYE THERAPY OP) Apply 1 drop to eye daily.    . halobetasol (ULTRAVATE) 0.05 % ointment Apply 1 application topically as needed. For exzema.    . hydrOXYzine (ATARAX/VISTARIL) 10 MG tablet Take 10 mg by mouth every 8 (eight) hours.  4  . lidocaine-prilocaine (EMLA) cream Apply 1 application topically as needed. Apply to portacath before each chemotherapy. 30 g 0  . Multiple Vitamin (MULTIVITAMIN WITH MINERALS) TABS tablet Take 1 tablet by mouth daily.    . ondansetron (ZOFRAN) 4 MG tablet Take 1 tablet (4 mg total) by mouth every 8 (eight) hours as needed for nausea or vomiting. 20 tablet 0  . terbinafine (LAMISIL) 1 % cream Apply 1 application topically 2 (two) times daily.    Marland Kitchen tretinoin (RETIN-A) 0.1 % cream Apply 1 application topically at bedtime.     No current facility-administered medications for this visit.     Allergies: No Known Allergies  Past Medical History, Surgical history, Social history, and Family History were reviewed and updated.   Physical Exam: Blood pressure 131/70, pulse 88, temperature 98.1 F (36.7 C), temperature source Oral, resp. rate 18, height 6\' 4"  (1.93 m), weight 202 lb 9.6 oz (91.899 kg), SpO2 100 %. ECOG: 0 General appearance: alert and cooperative. It without distress. Head: Normocephalic, without obvious abnormality no oral ulcers or lesions. Neck: no adenopathy Lymph nodes: Cervical, supraclavicular, and axillary  nodes normal. Heart:regular rate and rhythm, S1, S2 normal, no murmur, click, rub or gallop Lung:chest clear, no wheezing, rales, normal symmetric air entry Abdomin: soft, non-tender, without masses or organomegaly no shifting dullness or ascites. EXT:no erythema, induration, or nodules   Lab  Results: Lab Results  Component Value Date   WBC 12.7* 03/09/2015   HGB 11.0* 03/09/2015   HCT 33.9* 03/09/2015   MCV 90.9 03/09/2015   PLT 362 03/09/2015     Chemistry      Component Value Date/Time   NA 136 02/16/2015 0810   NA 136 02/09/2015 1305   K 4.5 02/16/2015 0810   K 4.3 02/09/2015 1305   CL 104 02/09/2015 1305   CO2 26 02/16/2015 0810   CO2 24 02/09/2015 1305   BUN 21.4 02/16/2015 0810   BUN 18 02/09/2015 1305   CREATININE 0.9 02/16/2015 0810   CREATININE 0.76 02/09/2015 1305      Component Value Date/Time   CALCIUM 9.6 02/16/2015 0810   CALCIUM 9.1 02/09/2015 1305   ALKPHOS 718* 02/16/2015 0810   AST 19 02/16/2015 0810   ALT 19 02/16/2015 0810   BILITOT 0.50 02/16/2015 0810       Impression and Plan:  70 year old gentleman with the following issues:  1. Advanced prostate cancer diagnosed in October 2016. He presented with PSA of 2900 and unknown Gleason score. His disease includes widespread bony metastasis as well as pelvic adenopathy. He is status post bilateral orchiectomy done in November 2016.  He is on Taxotere at 75 mg/m with cycle 1 on 02/16/2015 for a total of 6 cycles. He tolerated chemotherapy well so far without any delayed complications. The plan is to proceed with cycle 2 without any dose reduction or delay.  2. IV access: Port-A-Cath inserted without any complications. EMLA cream available to the patient. No issues or complications related to that.  3. Antiemetics: Prescription for Zofran was given to the patient with instructions how to use it. Nausea have not been an issue at this time seems to be controlled.  4. Neutropenia prophylaxis: He will require Neulasta after each infusion. Complications associated that including arthralgias, myalgias and ways to combat that were reviewed. He use Claritin with no side effects at this time.  5. Hormone therapy: He is status post bilateral orchiectomy and that should be sufficient for the time  being.  6. Bone directed therapy: He'll be an excellent candidate for Xgeva once he is up to his dental clearance. He was planned to have dental surgery which likely he will have that delayed for well. This will continue to be on hold.  7. Anemia: Likely related to chronic disease and malignancy. Continue to monitor moving forward.  8. Follow-up: In 3 weeks for the next cycle of chemotherapy.    [facility], MD 1/6/20178:35 AM

## 2015-03-09 NOTE — Patient Instructions (Signed)

## 2015-03-09 NOTE — Patient Instructions (Signed)
[facility] Discharge Instructions for Patients Receiving Chemotherapy  Today you received the following chemotherapy agents Taxotere.  To help prevent nausea and vomiting after your treatment, we encourage you to take your nausea medication as prescribed.   If you develop nausea and vomiting that is not controlled by your nausea medication, call the clinic.   BELOW ARE SYMPTOMS THAT SHOULD BE REPORTED IMMEDIATELY:  *FEVER GREATER THAN 100.5 F  *CHILLS WITH OR WITHOUT FEVER  NAUSEA AND VOMITING THAT IS NOT CONTROLLED WITH YOUR NAUSEA MEDICATION  *UNUSUAL SHORTNESS OF BREATH  *UNUSUAL BRUISING OR BLEEDING  TENDERNESS IN MOUTH AND THROAT WITH OR WITHOUT PRESENCE OF ULCERS  *URINARY PROBLEMS  *BOWEL PROBLEMS  UNUSUAL RASH Items with * indicate a potential emergency and should be followed up as soon as possible.  Feel free to call the clinic you have any questions or concerns. The clinic phone number is [phone].  Please show the CHEMO ALERT CARD at check-in to the Emergency Department and triage nurse.   

## 2015-03-09 NOTE — Telephone Encounter (Signed)
Per staff message and POF I have scheduled appts. Advised scheduler of appts. JMW  

## 2015-03-10 LAB — PSA: PSA: 33.24 ng/mL — AB (ref <=4.00)

## 2015-03-12 ENCOUNTER — Ambulatory Visit (HOSPITAL_BASED_OUTPATIENT_CLINIC_OR_DEPARTMENT_OTHER): Payer: Medicare Other

## 2015-03-12 ENCOUNTER — Telehealth: Payer: Self-pay | Admitting: *Deleted

## 2015-03-12 VITALS — BP 135/86 | HR 94 | Temp 99.0°F

## 2015-03-12 DIAGNOSIS — C7951 Secondary malignant neoplasm of bone: Secondary | ICD-10-CM

## 2015-03-12 DIAGNOSIS — C61 Malignant neoplasm of prostate: Secondary | ICD-10-CM

## 2015-03-12 MED ORDER — PEGFILGRASTIM INJECTION 6 MG/0.6ML ~~LOC~~
6.0000 mg | PREFILLED_SYRINGE | Freq: Once | SUBCUTANEOUS | Status: AC
  Administered 2015-03-12: 6 mg via SUBCUTANEOUS
  Filled 2015-03-12: qty 0.6

## 2015-03-12 NOTE — Telephone Encounter (Signed)
-----   Message from [name], MD sent at 03/12/2015  9:01 AM EST ----- Please call his PSA. Coming down.

## 2015-03-12 NOTE — Telephone Encounter (Signed)
As noted below by Dr. Shadad, I informed patient of his PSA level. Patient verbalized understanding. 

## 2015-03-13 ENCOUNTER — Telehealth: Payer: Self-pay | Admitting: *Deleted

## 2015-03-13 NOTE — Telephone Encounter (Signed)
TC from patient stating that he has noticed a small bruissed appearing area over his PAC site (primarily the lower half) brownish gray in color. It does not extend beyond the edges of the port. Denies swelling or tenderness. Port last accessed on 03/09/15 for labs and chemo (taxotere). Advised pt to observe site daily and if more changes occur to call back. Port inserted in early [place], without problems, platelets WNL, not on blood thinners. Discoloration/bruising likely a result of being accessed. Pt voiced understanding and will call back if site changes any further. Or if develops pain, fever or any other symptoms.

## 2015-03-26 ENCOUNTER — Encounter: Payer: Self-pay | Admitting: *Deleted

## 2015-03-28 ENCOUNTER — Other Ambulatory Visit: Payer: Self-pay | Admitting: *Deleted

## 2015-03-28 DIAGNOSIS — C61 Malignant neoplasm of prostate: Secondary | ICD-10-CM

## 2015-03-30 ENCOUNTER — Ambulatory Visit (HOSPITAL_BASED_OUTPATIENT_CLINIC_OR_DEPARTMENT_OTHER): Payer: Medicare Other | Admitting: Oncology

## 2015-03-30 ENCOUNTER — Telehealth: Payer: Self-pay | Admitting: Oncology

## 2015-03-30 ENCOUNTER — Other Ambulatory Visit (HOSPITAL_BASED_OUTPATIENT_CLINIC_OR_DEPARTMENT_OTHER): Payer: Medicare Other

## 2015-03-30 ENCOUNTER — Ambulatory Visit: Payer: Medicare Other

## 2015-03-30 ENCOUNTER — Ambulatory Visit (HOSPITAL_BASED_OUTPATIENT_CLINIC_OR_DEPARTMENT_OTHER): Payer: Medicare Other

## 2015-03-30 ENCOUNTER — Telehealth: Payer: Self-pay | Admitting: *Deleted

## 2015-03-30 VITALS — BP 142/87 | HR 74 | Temp 97.5°F | Resp 18 | Ht 76.0 in | Wt 204.7 lb

## 2015-03-30 DIAGNOSIS — C7951 Secondary malignant neoplasm of bone: Secondary | ICD-10-CM

## 2015-03-30 DIAGNOSIS — Z5111 Encounter for antineoplastic chemotherapy: Secondary | ICD-10-CM | POA: Diagnosis present

## 2015-03-30 DIAGNOSIS — C61 Malignant neoplasm of prostate: Secondary | ICD-10-CM

## 2015-03-30 DIAGNOSIS — D649 Anemia, unspecified: Secondary | ICD-10-CM | POA: Diagnosis not present

## 2015-03-30 DIAGNOSIS — Z95828 Presence of other vascular implants and grafts: Secondary | ICD-10-CM

## 2015-03-30 LAB — CBC WITH DIFFERENTIAL/PLATELET
BASO%: 0.7 % (ref 0.0–2.0)
Basophils Absolute: 0 10*3/uL (ref 0.0–0.1)
EOS%: 0.2 % (ref 0.0–7.0)
Eosinophils Absolute: 0 10*3/uL (ref 0.0–0.5)
HEMATOCRIT: 33.7 % — AB (ref 38.4–49.9)
HGB: 11.2 g/dL — ABNORMAL LOW (ref 13.0–17.1)
LYMPH%: 24.2 % (ref 14.0–49.0)
MCH: 29.8 pg (ref 27.2–33.4)
MCHC: 33.2 g/dL (ref 32.0–36.0)
MCV: 89.6 fL (ref 79.3–98.0)
MONO#: 0.7 10*3/uL (ref 0.1–0.9)
MONO%: 12.9 % (ref 0.0–14.0)
NEUT%: 62 % (ref 39.0–75.0)
NEUTROS ABS: 3.3 10*3/uL (ref 1.5–6.5)
PLATELETS: 216 10*3/uL (ref 140–400)
RBC: 3.76 10*6/uL — AB (ref 4.20–5.82)
RDW: 17 % — ABNORMAL HIGH (ref 11.0–14.6)
WBC: 5.3 10*3/uL (ref 4.0–10.3)
lymph#: 1.3 10*3/uL (ref 0.9–3.3)

## 2015-03-30 LAB — COMPREHENSIVE METABOLIC PANEL
ALT: 17 U/L (ref 0–55)
AST: 19 U/L (ref 5–34)
Albumin: 3.7 g/dL (ref 3.5–5.0)
Alkaline Phosphatase: 271 U/L — ABNORMAL HIGH (ref 40–150)
Anion Gap: 7 mEq/L (ref 3–11)
BUN: 13.4 mg/dL (ref 7.0–26.0)
CHLORIDE: 104 meq/L (ref 98–109)
CO2: 25 meq/L (ref 22–29)
Calcium: 9.3 mg/dL (ref 8.4–10.4)
Creatinine: 0.8 mg/dL (ref 0.7–1.3)
GLUCOSE: 99 mg/dL (ref 70–140)
POTASSIUM: 4.3 meq/L (ref 3.5–5.1)
SODIUM: 136 meq/L (ref 136–145)
Total Bilirubin: 0.39 mg/dL (ref 0.20–1.20)
Total Protein: 6.5 g/dL (ref 6.4–8.3)

## 2015-03-30 MED ORDER — HEPARIN SOD (PORK) LOCK FLUSH 100 UNIT/ML IV SOLN
500.0000 [IU] | Freq: Once | INTRAVENOUS | Status: AC | PRN
  Administered 2015-03-30: 500 [IU]
  Filled 2015-03-30: qty 5

## 2015-03-30 MED ORDER — SODIUM CHLORIDE 0.9 % IV SOLN
Freq: Once | INTRAVENOUS | Status: AC
  Administered 2015-03-30: 10:00:00 via INTRAVENOUS

## 2015-03-30 MED ORDER — DOCETAXEL CHEMO INJECTION 160 MG/16ML
75.0000 mg/m2 | Freq: Once | INTRAVENOUS | Status: AC
  Administered 2015-03-30: 160 mg via INTRAVENOUS
  Filled 2015-03-30: qty 16

## 2015-03-30 MED ORDER — SODIUM CHLORIDE 0.9 % IV SOLN
Freq: Once | INTRAVENOUS | Status: AC
  Administered 2015-03-30: 10:00:00 via INTRAVENOUS
  Filled 2015-03-30: qty 4

## 2015-03-30 MED ORDER — SODIUM CHLORIDE 0.9% FLUSH
10.0000 mL | INTRAVENOUS | Status: DC | PRN
  Administered 2015-03-30: 10 mL via INTRAVENOUS
  Filled 2015-03-30: qty 10

## 2015-03-30 MED ORDER — SODIUM CHLORIDE 0.9 % IJ SOLN
10.0000 mL | INTRAMUSCULAR | Status: DC | PRN
  Administered 2015-03-30: 10 mL
  Filled 2015-03-30: qty 10

## 2015-03-30 NOTE — Progress Notes (Signed)
Hematology and Oncology Follow Up Visit  Gadge Grgurich [id number] June 09, 1945 70 y.o. 03/30/2015 9:44 AM No PCP Per PatientNo ref. provider found   Principle Diagnosis: 70 year old gentleman with advanced prostate cancer diagnosed in October 2016. He presented with a PSA of 2900 and widespread bone metastasis as well as pelvic adenopathy.    Prior Therapy: Status post orchiectomy done on November 2016. Status post port placement on 02/09/2015.  Current therapy: Taxotere chemotherapy at 75 mg/m to start on 02/16/2015. He is here for cycle 3 out of planned 6.  Interim History:  Mr. Rutland presents today for a follow-up visit. Since the last visit, he continues to tolerate Taxotere chemotherapy without any complications. He reports some mild fatigue any afternoon but no other issues. He denied any nausea, abdominal pain or any GI complications. He denied any infusion-related issues or peripheral neuropathy. He does not report any bone pain or back pain. He reports that his lower extremity edema continues to improve and nearly resolved.  He continues to work and perform activities of daily living without any decline. His quality of life have not changed with the start of chemotherapy.  He does not report any headaches, blurry vision, syncope or seizures. He does not report any fevers, chills, sweats or weight loss. He does not report any chest pain, palpitation, orthopnea. Does not report any cough, wheezing, hemoptysis or dyspnea on exertion. He does not report any nausea, vomiting, abdominal pain, hematochezia. He does not report any frequency, urgency or hesitancy. Does not report any hematuria or dysuria. He does not report any arthralgias, myalgias. Does not report any lymphadenopathy or petechiae. Does not report any change in his mood including depression or anxiety. Remaining review of systems unremarkable  Medications: I have reviewed the patient's current medications.   Current Outpatient  Prescriptions  Medication Sig Dispense Refill  . BIOTIN 5000 PO Take 1 tablet by mouth daily.    . Calcium Carbonate-Vitamin D3 (CALCIUM 600+D3) 600-400 MG-UNIT TABS Take 1 tablet by mouth daily.    . fexofenadine (ALLEGRA) 180 MG tablet Take 180 mg by mouth daily.    . fluticasone (FLONASE) 50 MCG/ACT nasal spray Place 1 spray into both nostrils daily as needed for allergies or rhinitis.    . Glycerin-Polysorbate 80 (REFRESH DRY EYE THERAPY OP) Apply 1 drop to eye daily.    . halobetasol (ULTRAVATE) 0.05 % ointment Apply 1 application topically as needed. For exzema.    . hydrOXYzine (ATARAX/VISTARIL) 10 MG tablet Take 10 mg by mouth every 8 (eight) hours.  4  . lidocaine-prilocaine (EMLA) cream Apply 1 application topically as needed. Apply to portacath before each chemotherapy. 30 g 0  . Multiple Vitamin (MULTIVITAMIN WITH MINERALS) TABS tablet Take 1 tablet by mouth daily.    . ondansetron (ZOFRAN) 4 MG tablet Take 1 tablet (4 mg total) by mouth every 8 (eight) hours as needed for nausea or vomiting. 20 tablet 0  . terbinafine (LAMISIL) 1 % cream Apply 1 application topically 2 (two) times daily.    Marland Kitchen tretinoin (RETIN-A) 0.1 % cream Apply 1 application topically at bedtime.     No current facility-administered medications for this visit.   Facility-Administered Medications Ordered in Other Visits  Medication Dose Route Frequency Provider Last Rate Last Dose  . sodium chloride flush (NS) 0.9 % injection 10 mL  10 mL Intravenous PRN [name], MD   10 mL at 03/30/15 0859     Allergies: No Known Allergies  Past Medical History,  Surgical history, Social history, and Family History were reviewed and updated.   Physical Exam: Blood pressure 142/87, pulse 74, temperature 97.5 F (36.4 C), temperature source Oral, resp. rate 18, height 6\' 4"  (1.93 m), weight 204 lb 11.2 oz (92.851 kg), SpO2 100 %. ECOG: 0 General appearance: alert and cooperative. Well-appearing gentleman without  distress. Head: Normocephalic, without obvious abnormality no oral ulcers or lesions. Neck: no adenopathy Lymph nodes: Cervical, supraclavicular, and axillary nodes normal. Heart:regular rate and rhythm, S1, S2 normal, no murmur, click, rub or gallop Lung:chest clear, no wheezing, rales, normal symmetric air entry Abdomin: soft, non-tender, without masses or organomegaly no rebound or guarding. EXT:no erythema, induration, or nodules   Lab Results: Lab Results  Component Value Date   WBC 5.3 03/30/2015   HGB 11.2* 03/30/2015   HCT 33.7* 03/30/2015   MCV 89.6 03/30/2015   PLT 216 03/30/2015     Chemistry      Component Value Date/Time   NA 136 03/30/2015 0821   NA 136 02/09/2015 1305   K 4.3 03/30/2015 0821   K 4.3 02/09/2015 1305   CL 104 02/09/2015 1305   CO2 25 03/30/2015 0821   CO2 24 02/09/2015 1305   BUN 13.4 03/30/2015 0821   BUN 18 02/09/2015 1305   CREATININE 0.8 03/30/2015 0821   CREATININE 0.76 02/09/2015 1305      Component Value Date/Time   CALCIUM 9.3 03/30/2015 0821   CALCIUM 9.1 02/09/2015 1305   ALKPHOS 271* 03/30/2015 0821   AST 19 03/30/2015 0821   ALT 17 03/30/2015 0821   BILITOT 0.39 03/30/2015 0821    Results for SERENA, WALLOCH (MRN [id number]) as of 03/30/2015 09:46  Ref. Range 02/16/2015 08:11 03/09/2015 08:02  PSA Latest Ref Range: <=4.00 ng/mL 47.09 (H) 33.24 (H)     Impression and Plan:  70 year old gentleman with the following issues:  1. Advanced prostate cancer diagnosed in October 2016. He presented with PSA of 2900 and unknown Gleason score. His disease includes widespread bony metastasis as well as pelvic adenopathy. He is status post bilateral orchiectomy done in November 2016.  He is on Taxotere at 75 mg/m with cycle 1 on 02/16/2015 for a total of 6 cycles. He tolerated chemotherapy well without any complications. The plan is to proceed with cycle 3 today without any dose reduction or delay.  2. IV access: Port-A-Cath inserted  without any complications. EMLA cream available to the patient. No complaints related to his Port-A-Cath.  3. Antiemetics: Prescription for Zofran is available to the patient. No issue so far.  4. Neutropenia prophylaxis: He will require Neulasta after each infusion. Complications associated that including arthralgias, myalgias but he has not experienced any others at this time.  5. Hormone therapy: He is status post bilateral orchiectomy and that should be sufficient for the time being.  6. Bone directed therapy: He'll be an excellent candidate for Xgeva once he is up to his dental clearance. He was planned to have dental surgery which likely he will have that delayed for well. This will continue to be on hold.  7. Anemia: Likely related to chronic disease and malignancy. Continue to monitor moving forward. No need for any growth factor support or transfusions.  8. Follow-up: In 3 weeks for the next cycle of chemotherapy. He will also be scheduled for cycle 5 which will be on 05/11/2015.    [facility], MD 1/27/20179:44 AM

## 2015-03-30 NOTE — Telephone Encounter (Signed)
Per staff message and POF I have scheduled appts. Advised scheduler of appts. JMW  

## 2015-03-30 NOTE — Patient Instructions (Signed)

## 2015-03-30 NOTE — Patient Instructions (Signed)
[facility] Discharge Instructions for Patients Receiving Chemotherapy  Today you received the following chemotherapy agents Taxotere.  To help prevent nausea and vomiting after your treatment, we encourage you to take your nausea medication as prescribed.   If you develop nausea and vomiting that is not controlled by your nausea medication, call the clinic.   BELOW ARE SYMPTOMS THAT SHOULD BE REPORTED IMMEDIATELY:  *FEVER GREATER THAN 100.5 F  *CHILLS WITH OR WITHOUT FEVER  NAUSEA AND VOMITING THAT IS NOT CONTROLLED WITH YOUR NAUSEA MEDICATION  *UNUSUAL SHORTNESS OF BREATH  *UNUSUAL BRUISING OR BLEEDING  TENDERNESS IN MOUTH AND THROAT WITH OR WITHOUT PRESENCE OF ULCERS  *URINARY PROBLEMS  *BOWEL PROBLEMS  UNUSUAL RASH Items with * indicate a potential emergency and should be followed up as soon as possible.  Feel free to call the clinic you have any questions or concerns. The clinic phone number is [phone].  Please show the CHEMO ALERT CARD at check-in to the Emergency Department and triage nurse.   

## 2015-03-30 NOTE — Telephone Encounter (Signed)
Scheduled patient per pof, avs report printed. Staff message sent to [facility]

## 2015-03-31 LAB — PSA (PARALLEL TESTING): PSA: 22.87 ng/mL — ABNORMAL HIGH (ref <=4.00)

## 2015-03-31 LAB — PSA: Prostate Specific Ag, Serum: 22.2 ng/mL — ABNORMAL HIGH (ref 0.0–4.0)

## 2015-04-02 ENCOUNTER — Telehealth: Payer: Self-pay | Admitting: *Deleted

## 2015-04-02 ENCOUNTER — Ambulatory Visit (HOSPITAL_BASED_OUTPATIENT_CLINIC_OR_DEPARTMENT_OTHER): Payer: Medicare Other

## 2015-04-02 VITALS — BP 152/80 | HR 94 | Temp 98.2°F

## 2015-04-02 DIAGNOSIS — C61 Malignant neoplasm of prostate: Secondary | ICD-10-CM | POA: Diagnosis not present

## 2015-04-02 DIAGNOSIS — C7951 Secondary malignant neoplasm of bone: Secondary | ICD-10-CM

## 2015-04-02 MED ORDER — PEGFILGRASTIM INJECTION 6 MG/0.6ML ~~LOC~~
6.0000 mg | PREFILLED_SYRINGE | Freq: Once | SUBCUTANEOUS | Status: AC
  Administered 2015-04-02: 6 mg via SUBCUTANEOUS
  Filled 2015-04-02: qty 0.6

## 2015-04-02 NOTE — Telephone Encounter (Signed)
Spoke with patient, gave results of last PSA 

## 2015-04-02 NOTE — Telephone Encounter (Signed)
-----   Message from [name], MD sent at 04/02/2015  9:32 AM EST ----- Please let him know his PSA is down again.

## 2015-04-13 ENCOUNTER — Other Ambulatory Visit: Payer: Medicare Other

## 2015-04-13 ENCOUNTER — Ambulatory Visit: Payer: Medicare Other | Admitting: Oncology

## 2015-04-20 ENCOUNTER — Telehealth: Payer: Self-pay | Admitting: Nurse Practitioner

## 2015-04-20 ENCOUNTER — Ambulatory Visit (HOSPITAL_BASED_OUTPATIENT_CLINIC_OR_DEPARTMENT_OTHER): Payer: Medicare Other | Admitting: Nurse Practitioner

## 2015-04-20 ENCOUNTER — Other Ambulatory Visit (HOSPITAL_BASED_OUTPATIENT_CLINIC_OR_DEPARTMENT_OTHER): Payer: Medicare Other

## 2015-04-20 ENCOUNTER — Telehealth: Payer: Self-pay | Admitting: *Deleted

## 2015-04-20 ENCOUNTER — Ambulatory Visit (HOSPITAL_BASED_OUTPATIENT_CLINIC_OR_DEPARTMENT_OTHER): Payer: Medicare Other

## 2015-04-20 ENCOUNTER — Ambulatory Visit: Payer: Medicare Other

## 2015-04-20 VITALS — BP 139/84 | HR 80 | Temp 98.1°F | Resp 18 | Ht 76.0 in | Wt 207.0 lb

## 2015-04-20 DIAGNOSIS — C649 Malignant neoplasm of unspecified kidney, except renal pelvis: Secondary | ICD-10-CM | POA: Diagnosis not present

## 2015-04-20 DIAGNOSIS — C61 Malignant neoplasm of prostate: Secondary | ICD-10-CM | POA: Diagnosis not present

## 2015-04-20 DIAGNOSIS — Z95828 Presence of other vascular implants and grafts: Secondary | ICD-10-CM

## 2015-04-20 DIAGNOSIS — C7951 Secondary malignant neoplasm of bone: Secondary | ICD-10-CM | POA: Diagnosis not present

## 2015-04-20 DIAGNOSIS — Z5111 Encounter for antineoplastic chemotherapy: Secondary | ICD-10-CM

## 2015-04-20 LAB — COMPREHENSIVE METABOLIC PANEL
ALBUMIN: 3.7 g/dL (ref 3.5–5.0)
ALT: 16 U/L (ref 0–55)
AST: 16 U/L (ref 5–34)
Alkaline Phosphatase: 206 U/L — ABNORMAL HIGH (ref 40–150)
Anion Gap: 8 mEq/L (ref 3–11)
BUN: 15.7 mg/dL (ref 7.0–26.0)
CHLORIDE: 103 meq/L (ref 98–109)
CO2: 25 meq/L (ref 22–29)
CREATININE: 0.8 mg/dL (ref 0.7–1.3)
Calcium: 9.2 mg/dL (ref 8.4–10.4)
EGFR: >90 mL/min/{1.73_m2} (ref >90)
Glucose: 98 mg/dl (ref 70–140)
POTASSIUM: 4.3 meq/L (ref 3.5–5.1)
Sodium: 136 mEq/L (ref 136–145)
TOTAL PROTEIN: 6.5 g/dL (ref 6.4–8.3)
Total Bilirubin: 0.35 mg/dL (ref 0.20–1.20)

## 2015-04-20 LAB — CBC WITH DIFFERENTIAL/PLATELET
BASO%: 0.3 % (ref 0.0–2.0)
Basophils Absolute: 0 10*3/uL (ref 0.0–0.1)
EOS ABS: 0 10*3/uL (ref 0.0–0.5)
EOS%: 0.1 % (ref 0.0–7.0)
HEMATOCRIT: 33.4 % — AB (ref 38.4–49.9)
HGB: 11.1 g/dL — ABNORMAL LOW (ref 13.0–17.1)
LYMPH#: 1.3 10*3/uL (ref 0.9–3.3)
LYMPH%: 19.4 % (ref 14.0–49.0)
MCH: 30.3 pg (ref 27.2–33.4)
MCHC: 33.2 g/dL (ref 32.0–36.0)
MCV: 91.3 fL (ref 79.3–98.0)
MONO#: 0.7 10*3/uL (ref 0.1–0.9)
MONO%: 11.4 % (ref 0.0–14.0)
NEUT%: 68.8 % (ref 39.0–75.0)
NEUTROS ABS: 4.4 10*3/uL (ref 1.5–6.5)
PLATELETS: 231 10*3/uL (ref 140–400)
RBC: 3.66 10*6/uL — ABNORMAL LOW (ref 4.20–5.82)
RDW: 18.1 % — ABNORMAL HIGH (ref 11.0–14.6)
WBC: 6.5 10*3/uL (ref 4.0–10.3)

## 2015-04-20 MED ORDER — DOCETAXEL CHEMO INJECTION 160 MG/16ML
75.0000 mg/m2 | Freq: Once | INTRAVENOUS | Status: AC
  Administered 2015-04-20: 160 mg via INTRAVENOUS
  Filled 2015-04-20: qty 16

## 2015-04-20 MED ORDER — SODIUM CHLORIDE 0.9 % IV SOLN
Freq: Once | INTRAVENOUS | Status: AC
  Administered 2015-04-20: 10:00:00 via INTRAVENOUS

## 2015-04-20 MED ORDER — DEXAMETHASONE SODIUM PHOSPHATE 100 MG/10ML IJ SOLN
Freq: Once | INTRAMUSCULAR | Status: AC
  Administered 2015-04-20: 10:00:00 via INTRAVENOUS
  Filled 2015-04-20: qty 4

## 2015-04-20 MED ORDER — SODIUM CHLORIDE 0.9% FLUSH
10.0000 mL | INTRAVENOUS | Status: DC | PRN
  Administered 2015-04-20: 10 mL via INTRAVENOUS
  Filled 2015-04-20: qty 10

## 2015-04-20 NOTE — Telephone Encounter (Signed)
Pt confirmed labs/ov per 02/17 POF, gave pt AVS and Calendar.Cherylann Banas, sent msg to add chemo

## 2015-04-20 NOTE — Progress Notes (Signed)
  [facility] OFFICE PROGRESS NOTE   Diagnosis:  Prostate cancer  INTERVAL HISTORY:   Mr. Norquist returns as scheduled. He completed cycle 3 Taxotere 03/30/2015. He denies nausea/vomiting. No mouth sores. No diarrhea or constipation. No numbness or tingling in his hands or feet. No tearing. He denies pain. No edema. No shortness of breath. He reports a good appetite.  Objective:  Vital signs in last 24 hours:  Blood pressure 139/84, pulse 80, temperature 98.1 F (36.7 C), temperature source Oral, resp. rate 18, height 6\' 4"  (1.93 m), weight 207 lb (93.895 kg), SpO2 100 %.    HEENT: No thrush or ulcers. Resp: Lungs clear bilaterally. Cardio: Regular rate and rhythm. GI: Abdomen soft and nontender. No hepatomegaly. Vascular: Trace edema left lower leg. Neuro: Vibratory sense intact over the fingertips per tuning fork exam.  Skin: No rash. Port-A-Cath without erythema.    Lab Results:  Lab Results  Component Value Date   WBC 6.5 04/20/2015   HGB 11.1* 04/20/2015   HCT 33.4* 04/20/2015   MCV 91.3 04/20/2015   PLT 231 04/20/2015   NEUTROABS 4.4 04/20/2015    Imaging:  No results found.  Medications: I have reviewed the patient's current medications.  Assessment/Plan: 1. Advanced prostate cancer diagnosed in October 2016. He presented with PSA of 2900 and unknown Gleason score. Disease includes widespread bony metastasis as well as pelvic adenopathy. Status post bilateral orchiectomy done in November 2016.  Currently on Taxotere at 75 mg/m with cycle 1 on 02/16/2015 with a total of 6 cycles planned. Cycle 3 completed 03/30/2015.  2. IV access: Port-A-Cath inserted without any complications. EMLA cream available to the patient. No complaints related to his Port-A-Cath.  3. Antiemetics: Prescription for Zofran is available to the patient. No issue so far.  4. Neutropenia prophylaxis: He receives Neulasta after each infusion.   5. Hormone therapy: He is  status post bilateral orchiectomy.  6. Bone directed therapy: On hold pending dental clearance.  7. Anemia: Likely related to chronic disease and malignancy. Continue to monitor moving forward. No need for any growth factor support or transfusions.     Disposition: Mr. Smethers appears stable. He has completed 3 cycles of Taxotere. PSA continues to improve. Plan to proceed with cycle 4 Taxotere today as scheduled.  He will return for a follow-up visit and cycle 5 Taxotere in 3 weeks. He will contact the office in the interim with any problems.    Ned Card ANP/GNP-BC   04/20/2015  9:20 AM

## 2015-04-20 NOTE — Patient Instructions (Signed)

## 2015-04-20 NOTE — Telephone Encounter (Signed)
Per staff message and POF I have scheduled appts. Advised scheduler of appts. JMW  

## 2015-04-20 NOTE — Patient Instructions (Signed)
[facility] Discharge Instructions for Patients Receiving Chemotherapy  Today you received the following chemotherapy agents Taxotere.  To help prevent nausea and vomiting after your treatment, we encourage you to take your nausea medication as prescribed.   If you develop nausea and vomiting that is not controlled by your nausea medication, call the clinic.   BELOW ARE SYMPTOMS THAT SHOULD BE REPORTED IMMEDIATELY:  *FEVER GREATER THAN 100.5 F  *CHILLS WITH OR WITHOUT FEVER  NAUSEA AND VOMITING THAT IS NOT CONTROLLED WITH YOUR NAUSEA MEDICATION  *UNUSUAL SHORTNESS OF BREATH  *UNUSUAL BRUISING OR BLEEDING  TENDERNESS IN MOUTH AND THROAT WITH OR WITHOUT PRESENCE OF ULCERS  *URINARY PROBLEMS  *BOWEL PROBLEMS  UNUSUAL RASH Items with * indicate a potential emergency and should be followed up as soon as possible.  Feel free to call the clinic you have any questions or concerns. The clinic phone number is [phone].  Please show the CHEMO ALERT CARD at check-in to the Emergency Department and triage nurse.   

## 2015-04-21 LAB — PSA (PARALLEL TESTING): PSA: 14.57 ng/mL — ABNORMAL HIGH (ref <=4.00)

## 2015-04-21 LAB — PSA: Prostate Specific Ag, Serum: 14.6 ng/mL — ABNORMAL HIGH (ref 0.0–4.0)

## 2015-04-23 ENCOUNTER — Telehealth: Payer: Self-pay | Admitting: *Deleted

## 2015-04-23 ENCOUNTER — Ambulatory Visit (HOSPITAL_BASED_OUTPATIENT_CLINIC_OR_DEPARTMENT_OTHER): Payer: Medicare Other

## 2015-04-23 VITALS — BP 142/86 | HR 90 | Temp 98.0°F

## 2015-04-23 DIAGNOSIS — C61 Malignant neoplasm of prostate: Secondary | ICD-10-CM | POA: Diagnosis not present

## 2015-04-23 DIAGNOSIS — C7951 Secondary malignant neoplasm of bone: Secondary | ICD-10-CM | POA: Diagnosis not present

## 2015-04-23 MED ORDER — PEGFILGRASTIM INJECTION 6 MG/0.6ML ~~LOC~~
6.0000 mg | PREFILLED_SYRINGE | Freq: Once | SUBCUTANEOUS | Status: AC
  Administered 2015-04-23: 6 mg via SUBCUTANEOUS
  Filled 2015-04-23: qty 0.6

## 2015-04-23 NOTE — Telephone Encounter (Signed)
-----   Message from [name], MD sent at 04/23/2015 10:09 AM EST ----- Please let him know his PSA is down to 14

## 2015-04-23 NOTE — Telephone Encounter (Signed)
As noted below by Dr. Shadad, I informed patient of his PSA level. Patient verbalized understanding. 

## 2015-05-11 ENCOUNTER — Other Ambulatory Visit (HOSPITAL_BASED_OUTPATIENT_CLINIC_OR_DEPARTMENT_OTHER): Payer: Medicare Other

## 2015-05-11 ENCOUNTER — Ambulatory Visit (HOSPITAL_BASED_OUTPATIENT_CLINIC_OR_DEPARTMENT_OTHER): Payer: Medicare Other

## 2015-05-11 ENCOUNTER — Ambulatory Visit (HOSPITAL_BASED_OUTPATIENT_CLINIC_OR_DEPARTMENT_OTHER): Payer: Medicare Other | Admitting: Oncology

## 2015-05-11 VITALS — BP 129/76 | HR 91 | Temp 98.1°F | Resp 18 | Ht 76.0 in | Wt 207.7 lb

## 2015-05-11 DIAGNOSIS — Z5111 Encounter for antineoplastic chemotherapy: Secondary | ICD-10-CM

## 2015-05-11 DIAGNOSIS — C7951 Secondary malignant neoplasm of bone: Secondary | ICD-10-CM

## 2015-05-11 DIAGNOSIS — C61 Malignant neoplasm of prostate: Secondary | ICD-10-CM | POA: Diagnosis not present

## 2015-05-11 DIAGNOSIS — D649 Anemia, unspecified: Secondary | ICD-10-CM | POA: Diagnosis not present

## 2015-05-11 DIAGNOSIS — Z95828 Presence of other vascular implants and grafts: Secondary | ICD-10-CM

## 2015-05-11 LAB — CBC WITH DIFFERENTIAL/PLATELET
BASO%: 0.4 % (ref 0.0–2.0)
Basophils Absolute: 0 10*3/uL (ref 0.0–0.1)
EOS%: 0.1 % (ref 0.0–7.0)
Eosinophils Absolute: 0 10*3/uL (ref 0.0–0.5)
HCT: 33.7 % — ABNORMAL LOW (ref 38.4–49.9)
HGB: 11.4 g/dL — ABNORMAL LOW (ref 13.0–17.1)
LYMPH%: 17.9 % (ref 14.0–49.0)
MCH: 31.4 pg (ref 27.2–33.4)
MCHC: 33.7 g/dL (ref 32.0–36.0)
MCV: 93.1 fL (ref 79.3–98.0)
MONO#: 0.7 10*3/uL (ref 0.1–0.9)
MONO%: 8.6 % (ref 0.0–14.0)
NEUT%: 73 % (ref 39.0–75.0)
NEUTROS ABS: 5.6 10*3/uL (ref 1.5–6.5)
PLATELETS: 241 10*3/uL (ref 140–400)
RBC: 3.62 10*6/uL — AB (ref 4.20–5.82)
RDW: 17.7 % — ABNORMAL HIGH (ref 11.0–14.6)
WBC: 7.7 10*3/uL (ref 4.0–10.3)
lymph#: 1.4 10*3/uL (ref 0.9–3.3)

## 2015-05-11 LAB — COMPREHENSIVE METABOLIC PANEL
ALT: 13 U/L (ref 0–55)
AST: 18 U/L (ref 5–34)
Albumin: 3.9 g/dL (ref 3.5–5.0)
Alkaline Phosphatase: 182 U/L — ABNORMAL HIGH (ref 40–150)
Anion Gap: 8 mEq/L (ref 3–11)
BILIRUBIN TOTAL: 0.41 mg/dL (ref 0.20–1.20)
BUN: 19 mg/dL (ref 7.0–26.0)
CO2: 24 meq/L (ref 22–29)
CREATININE: 0.8 mg/dL (ref 0.7–1.3)
Calcium: 9.1 mg/dL (ref 8.4–10.4)
Chloride: 103 mEq/L (ref 98–109)
EGFR: >90 mL/min/{1.73_m2} (ref >90)
GLUCOSE: 97 mg/dL (ref 70–140)
Potassium: 4.1 mEq/L (ref 3.5–5.1)
SODIUM: 135 meq/L — AB (ref 136–145)
TOTAL PROTEIN: 6.7 g/dL (ref 6.4–8.3)

## 2015-05-11 MED ORDER — HEPARIN SOD (PORK) LOCK FLUSH 100 UNIT/ML IV SOLN
500.0000 [IU] | Freq: Once | INTRAVENOUS | Status: AC | PRN
  Administered 2015-05-11: 500 [IU]
  Filled 2015-05-11: qty 5

## 2015-05-11 MED ORDER — SODIUM CHLORIDE 0.9% FLUSH
10.0000 mL | INTRAVENOUS | Status: DC | PRN
  Administered 2015-05-11: 10 mL via INTRAVENOUS
  Filled 2015-05-11: qty 10

## 2015-05-11 MED ORDER — SODIUM CHLORIDE 0.9 % IJ SOLN
10.0000 mL | INTRAMUSCULAR | Status: DC | PRN
  Administered 2015-05-11: 10 mL
  Filled 2015-05-11: qty 10

## 2015-05-11 MED ORDER — SODIUM CHLORIDE 0.9 % IV SOLN
Freq: Once | INTRAVENOUS | Status: AC
  Administered 2015-05-11: 12:00:00 via INTRAVENOUS

## 2015-05-11 MED ORDER — DOCETAXEL CHEMO INJECTION 160 MG/16ML
75.0000 mg/m2 | Freq: Once | INTRAVENOUS | Status: AC
  Administered 2015-05-11: 160 mg via INTRAVENOUS
  Filled 2015-05-11: qty 16

## 2015-05-11 MED ORDER — SODIUM CHLORIDE 0.9 % IV SOLN
Freq: Once | INTRAVENOUS | Status: AC
  Administered 2015-05-11: 13:00:00 via INTRAVENOUS
  Filled 2015-05-11: qty 4

## 2015-05-11 NOTE — Progress Notes (Signed)
Hematology and Oncology Follow Up Visit  Philip [name] [id number] [birth year] 70 y.o. 05/11/2015 11:42 AM No PCP Per PatientNo ref. provider found   Principle Diagnosis: 70 year old gentleman with advanced prostate cancer diagnosed in October 2016. He presented with a PSA of 2900 and widespread bone metastasis as well as pelvic adenopathy.    Prior Therapy: Status post orchiectomy done on November 2016. Status post port placement on 02/09/2015.  Current therapy: Taxotere chemotherapy at 75 mg/m to start on 02/16/2015. He is here for cycle 5 out of planned 6.  Interim History:  Philip [name] presents today for a follow-up visit. Since the last visit, he reports no recent changes in his health. He continues to do very well and continues to tolerate Taxotere chemotherapy without any complications. He reports some mild fatigue which lasts few days and is manageable. He denied any nausea, abdominal pain or any GI complications. He denied any infusion-related issues or peripheral neuropathy. He does not report any bone pain or back pain. He reports that his lower extremity edema continues to improve and still have some residual left-sided ankle edema.  He continues to work and perform activities of daily living without any decline. He denies any complications related to Neulasta such as severe arthralgias or myalgias.  He does not report any headaches, blurry vision, syncope or seizures. He does not report any fevers, chills, sweats or weight loss. He does not report any chest pain, palpitation, orthopnea. Does not report any cough, wheezing, hemoptysis or dyspnea on exertion. He does not report any nausea, vomiting, abdominal pain, hematochezia. He does not report any frequency, urgency or hesitancy. Does not report any hematuria or dysuria. He does not report any arthralgias, myalgias. Does not report any lymphadenopathy or petechiae. Does not report any change in his mood including depression or anxiety.  Remaining review of systems unremarkable  Medications: I have reviewed the patient's current medications.   Current Outpatient Prescriptions  Medication Sig Dispense Refill  . BIOTIN 5000 PO Take 1 tablet by mouth daily.    . Calcium Carbonate-Vitamin D3 (CALCIUM 600+D3) 600-400 MG-UNIT TABS Take 1 tablet by mouth daily.    . fexofenadine (ALLEGRA) 180 MG tablet Take 180 mg by mouth daily.    . fluticasone (FLONASE) 50 MCG/ACT nasal spray Place 1 spray into both nostrils daily as needed for allergies or rhinitis.    . Glycerin-Polysorbate 80 (REFRESH DRY EYE THERAPY OP) Apply 1 drop to eye daily.    . halobetasol (ULTRAVATE) 0.05 % ointment Apply 1 application topically as needed. For exzema.    . hydrOXYzine (ATARAX/VISTARIL) 10 MG tablet Take 10 mg by mouth every 8 (eight) hours.  4  . lidocaine-prilocaine (EMLA) cream Apply 1 application topically as needed. Apply to portacath before each chemotherapy. 30 g 0  . Multiple Vitamin (MULTIVITAMIN WITH MINERALS) TABS tablet Take 1 tablet by mouth daily.    . ondansetron (ZOFRAN) 4 MG tablet Take 1 tablet (4 mg total) by mouth every 8 (eight) hours as needed for nausea or vomiting. 20 tablet 0  . terbinafine (LAMISIL) 1 % cream Apply 1 application topically 2 (two) times daily.    Marland Kitchen tretinoin (RETIN-A) 0.1 % cream Apply 1 application topically at bedtime.     No current facility-administered medications for this visit.     Allergies: No Known Allergies  Past Medical History, Surgical history, Social history, and Family History were reviewed and updated.   Physical Exam: Blood pressure 129/76, pulse 91, temperature 98.1 F (36.7  C), temperature source Oral, resp. rate 18, height 6\' 4"  (1.93 m), weight 207 lb 11.2 oz (94.212 kg), SpO2 100 %. ECOG: 0 General appearance: alert and cooperative. Not in any distress. Head: Normocephalic, without obvious abnormality no oral thrush noted. Neck: no adenopathy Lymph nodes: Cervical,  supraclavicular, and axillary nodes normal. Heart:regular rate and rhythm, S1, S2 normal, no murmur, click, rub or gallop Lung:chest clear, no wheezing, rales, normal symmetric air entry Abdomin: soft, non-tender, without masses or organomegaly no shifting dullness or ascites. EXT:no erythema, induration, or nodules   Lab Results: Lab Results  Component Value Date   WBC 7.7 05/11/2015   HGB 11.4* 05/11/2015   HCT 33.7* 05/11/2015   MCV 93.1 05/11/2015   PLT 241 05/11/2015     Chemistry      Component Value Date/Time   NA 136 04/20/2015 0819   NA 136 02/09/2015 1305   K 4.3 04/20/2015 0819   K 4.3 02/09/2015 1305   CL 104 02/09/2015 1305   CO2 25 04/20/2015 0819   CO2 24 02/09/2015 1305   BUN 15.7 04/20/2015 0819   BUN 18 02/09/2015 1305   CREATININE 0.8 04/20/2015 0819   CREATININE 0.76 02/09/2015 1305      Component Value Date/Time   CALCIUM 9.2 04/20/2015 0819   CALCIUM 9.1 02/09/2015 1305   ALKPHOS 206* 04/20/2015 0819   AST 16 04/20/2015 0819   ALT 16 04/20/2015 0819   BILITOT 0.35 04/20/2015 0819       Results for Philip [name] (MRN [id number]) as of 05/11/2015 11:29  Ref. Range 03/30/2015 08:21 04/20/2015 08:20 04/20/2015 08:20  PSA Latest Ref Range: 0.0-4.0 ng/mL 22.2 (H) 14.57 (H) 14.6 (H)     Impression and Plan:  70 year old gentleman with the following issues:  1. Advanced prostate cancer diagnosed in October 2016. He presented with PSA of 2900 and unknown Gleason score. His disease includes widespread bony metastasis as well as pelvic adenopathy. He is status post bilateral orchiectomy done in November 2016.  He is on Taxotere at 75 mg/m with cycle 1 on 02/16/2015 for a total of 6 cycles. He tolerated chemotherapy well without any complications. The plan is to proceed with cycle 5 today without any dose reduction or delay.Upon completing 6 cycles of therapy, he will undergo staging workup and active surveillance after that. This was discussed today with  the patient in detail.  2. IV access: Port-A-Cath  in place without complications. This will be flushed every 6 weeks upon completion of chemotherapy.  3. Antiemetics: Prescription for Zofran is available to the patient. Nausea have not been an issue at this time.  4. Neutropenia prophylaxis: He will require Neulasta after each infusion. Complications associated that including arthralgias, myalgias but he has not experienced any others at this time.  5. Hormone therapy: He is status post bilateral orchiectomy and that should be sufficient for the time being.  6. Bone directed therapy: He'll be an excellent candidate for Xgeva once he is up to his dental clearance. He was planned to have dental surgery which likely he will have that delayed for well.   7. Anemia: Likely related to chronic disease and malignancy. Rella Larve continues to be stable without intervention is needed.  8. Follow-up: He will also be scheduled for cycle 6 which will be on 06/01/2015.    [facility], MD 3/10/201711:42 AM

## 2015-05-11 NOTE — Patient Instructions (Signed)

## 2015-05-11 NOTE — Patient Instructions (Signed)
[facility] Discharge Instructions for Patients Receiving Chemotherapy  Today you received the following chemotherapy agents taxotere  To help prevent nausea and vomiting after your treatment, we encourage you to take your nausea medication as directed  If you develop nausea and vomiting that is not controlled by your nausea medication, call the clinic.   BELOW ARE SYMPTOMS THAT SHOULD BE REPORTED IMMEDIATELY:  *FEVER GREATER THAN 100.5 F  *CHILLS WITH OR WITHOUT FEVER  NAUSEA AND VOMITING THAT IS NOT CONTROLLED WITH YOUR NAUSEA MEDICATION  *UNUSUAL SHORTNESS OF BREATH  *UNUSUAL BRUISING OR BLEEDING  TENDERNESS IN MOUTH AND THROAT WITH OR WITHOUT PRESENCE OF ULCERS  *URINARY PROBLEMS  *BOWEL PROBLEMS  UNUSUAL RASH Items with * indicate a potential emergency and should be followed up as soon as possible.  Feel free to call the clinic you have any questions or concerns. The clinic phone number is [phone].  Please show the CHEMO ALERT CARD at check-in to the Emergency Department and triage nurse.     

## 2015-05-12 LAB — PSA: PROSTATE SPECIFIC AG, SERUM: 12.4 ng/mL — AB (ref 0.0–4.0)

## 2015-05-14 ENCOUNTER — Ambulatory Visit (HOSPITAL_BASED_OUTPATIENT_CLINIC_OR_DEPARTMENT_OTHER): Payer: Medicare Other

## 2015-05-14 ENCOUNTER — Telehealth: Payer: Self-pay | Admitting: *Deleted

## 2015-05-14 ENCOUNTER — Encounter: Payer: Self-pay | Admitting: *Deleted

## 2015-05-14 VITALS — BP 149/83 | HR 90 | Temp 98.5°F

## 2015-05-14 DIAGNOSIS — C61 Malignant neoplasm of prostate: Secondary | ICD-10-CM | POA: Diagnosis not present

## 2015-05-14 DIAGNOSIS — C7951 Secondary malignant neoplasm of bone: Secondary | ICD-10-CM | POA: Diagnosis not present

## 2015-05-14 MED ORDER — PEGFILGRASTIM INJECTION 6 MG/0.6ML ~~LOC~~
6.0000 mg | PREFILLED_SYRINGE | Freq: Once | SUBCUTANEOUS | Status: AC
  Administered 2015-05-14: 6 mg via SUBCUTANEOUS
  Filled 2015-05-14: qty 0.6

## 2015-05-14 NOTE — Progress Notes (Signed)
Lm on patient identified VM. Last PSA level

## 2015-05-14 NOTE — Telephone Encounter (Signed)
-----   Message from [name], MD sent at 05/14/2015  8:24 AM EDT ----- Please let him know his PSA is down again.

## 2015-06-01 ENCOUNTER — Telehealth: Payer: Self-pay | Admitting: Oncology

## 2015-06-01 ENCOUNTER — Ambulatory Visit (HOSPITAL_BASED_OUTPATIENT_CLINIC_OR_DEPARTMENT_OTHER): Payer: Medicare Other

## 2015-06-01 ENCOUNTER — Other Ambulatory Visit (HOSPITAL_BASED_OUTPATIENT_CLINIC_OR_DEPARTMENT_OTHER): Payer: Medicare Other

## 2015-06-01 ENCOUNTER — Ambulatory Visit (HOSPITAL_BASED_OUTPATIENT_CLINIC_OR_DEPARTMENT_OTHER): Payer: Medicare Other | Admitting: Oncology

## 2015-06-01 ENCOUNTER — Ambulatory Visit: Payer: Medicare Other

## 2015-06-01 VITALS — BP 138/70 | HR 80 | Temp 97.4°F | Resp 18 | Ht 76.0 in | Wt 208.0 lb

## 2015-06-01 DIAGNOSIS — C61 Malignant neoplasm of prostate: Secondary | ICD-10-CM

## 2015-06-01 DIAGNOSIS — R530 Neoplastic (malignant) related fatigue: Secondary | ICD-10-CM | POA: Diagnosis not present

## 2015-06-01 DIAGNOSIS — C7951 Secondary malignant neoplasm of bone: Secondary | ICD-10-CM | POA: Diagnosis not present

## 2015-06-01 DIAGNOSIS — D649 Anemia, unspecified: Secondary | ICD-10-CM

## 2015-06-01 DIAGNOSIS — Z5111 Encounter for antineoplastic chemotherapy: Secondary | ICD-10-CM | POA: Diagnosis not present

## 2015-06-01 DIAGNOSIS — Z95828 Presence of other vascular implants and grafts: Secondary | ICD-10-CM

## 2015-06-01 LAB — CBC WITH DIFFERENTIAL/PLATELET
BASO%: 0.3 % (ref 0.0–2.0)
BASOS ABS: 0 10*3/uL (ref 0.0–0.1)
EOS ABS: 0 10*3/uL (ref 0.0–0.5)
EOS%: 0.3 % (ref 0.0–7.0)
HEMATOCRIT: 31.7 % — AB (ref 38.4–49.9)
HGB: 10.4 g/dL — ABNORMAL LOW (ref 13.0–17.1)
LYMPH#: 1 10*3/uL (ref 0.9–3.3)
LYMPH%: 14.1 % (ref 14.0–49.0)
MCH: 31.5 pg (ref 27.2–33.4)
MCHC: 32.8 g/dL (ref 32.0–36.0)
MCV: 96.1 fL (ref 79.3–98.0)
MONO#: 0.8 10*3/uL (ref 0.1–0.9)
MONO%: 11.5 % (ref 0.0–14.0)
NEUT#: 5.3 10*3/uL (ref 1.5–6.5)
NEUT%: 73.8 % (ref 39.0–75.0)
Platelets: 210 10*3/uL (ref 140–400)
RBC: 3.3 10*6/uL — ABNORMAL LOW (ref 4.20–5.82)
RDW: 16.6 % — AB (ref 11.0–14.6)
WBC: 7.2 10*3/uL (ref 4.0–10.3)

## 2015-06-01 LAB — COMPREHENSIVE METABOLIC PANEL
ALT: 11 U/L (ref 0–55)
ANION GAP: 6 meq/L (ref 3–11)
AST: 15 U/L (ref 5–34)
Albumin: 3.5 g/dL (ref 3.5–5.0)
Alkaline Phosphatase: 137 U/L (ref 40–150)
BUN: 17.2 mg/dL (ref 7.0–26.0)
CALCIUM: 9 mg/dL (ref 8.4–10.4)
CHLORIDE: 105 meq/L (ref 98–109)
CO2: 25 mEq/L (ref 22–29)
Creatinine: 0.8 mg/dL (ref 0.7–1.3)
EGFR: >90 mL/min/{1.73_m2} (ref >90)
Glucose: 99 mg/dl (ref 70–140)
POTASSIUM: 4.2 meq/L (ref 3.5–5.1)
Sodium: 136 mEq/L (ref 136–145)
Total Bilirubin: 0.33 mg/dL (ref 0.20–1.20)
Total Protein: 6.2 g/dL — ABNORMAL LOW (ref 6.4–8.3)

## 2015-06-01 MED ORDER — SODIUM CHLORIDE 0.9 % IJ SOLN
10.0000 mL | INTRAMUSCULAR | Status: DC | PRN
  Administered 2015-06-01: 10 mL
  Filled 2015-06-01: qty 10

## 2015-06-01 MED ORDER — SODIUM CHLORIDE 0.9 % IV SOLN
75.0000 mg/m2 | Freq: Once | INTRAVENOUS | Status: AC
  Administered 2015-06-01: 160 mg via INTRAVENOUS
  Filled 2015-06-01: qty 16

## 2015-06-01 MED ORDER — SODIUM CHLORIDE 0.9 % IV SOLN
Freq: Once | INTRAVENOUS | Status: AC
  Administered 2015-06-01: 09:00:00 via INTRAVENOUS
  Filled 2015-06-01: qty 4

## 2015-06-01 MED ORDER — HEPARIN SOD (PORK) LOCK FLUSH 100 UNIT/ML IV SOLN
500.0000 [IU] | Freq: Once | INTRAVENOUS | Status: AC | PRN
  Administered 2015-06-01: 500 [IU]
  Filled 2015-06-01: qty 5

## 2015-06-01 MED ORDER — SODIUM CHLORIDE 0.9% FLUSH
10.0000 mL | INTRAVENOUS | Status: DC | PRN
  Administered 2015-06-01: 10 mL via INTRAVENOUS
  Filled 2015-06-01: qty 10

## 2015-06-01 MED ORDER — SODIUM CHLORIDE 0.9 % IV SOLN
Freq: Once | INTRAVENOUS | Status: AC
  Administered 2015-06-01: 09:00:00 via INTRAVENOUS

## 2015-06-01 NOTE — Patient Instructions (Signed)

## 2015-06-01 NOTE — Patient Instructions (Signed)
[facility] Discharge Instructions for Patients Receiving Chemotherapy  Today you received the following chemotherapy agents Taxotere.  To help prevent nausea and vomiting after your treatment, we encourage you to take your nausea medication as prescribed.   If you develop nausea and vomiting that is not controlled by your nausea medication, call the clinic.   BELOW ARE SYMPTOMS THAT SHOULD BE REPORTED IMMEDIATELY:  *FEVER GREATER THAN 100.5 F  *CHILLS WITH OR WITHOUT FEVER  NAUSEA AND VOMITING THAT IS NOT CONTROLLED WITH YOUR NAUSEA MEDICATION  *UNUSUAL SHORTNESS OF BREATH  *UNUSUAL BRUISING OR BLEEDING  TENDERNESS IN MOUTH AND THROAT WITH OR WITHOUT PRESENCE OF ULCERS  *URINARY PROBLEMS  *BOWEL PROBLEMS  UNUSUAL RASH Items with * indicate a potential emergency and should be followed up as soon as possible.  Feel free to call the clinic you have any questions or concerns. The clinic phone number is [phone].  Please show the CHEMO ALERT CARD at check-in to the Emergency Department and triage nurse.   

## 2015-06-01 NOTE — Progress Notes (Signed)
Hematology and Oncology Follow Up Visit  Philip [name] [id number] 1945/09/05 70 y.o. 06/01/2015 8:34 AM No PCP Per PatientNo ref. provider found   Principle Diagnosis: 70 year old gentleman with advanced prostate cancer diagnosed in October 2016. He presented with a PSA of 2900 and widespread bone metastasis as well as pelvic adenopathy.    Prior Therapy: Status post orchiectomy done on November 2016. Status post port placement on 02/09/2015.  Current therapy: Taxotere chemotherapy at 75 mg/m to start on 02/16/2015. He is here for cycle 6 out of planned 6.  Interim History:  Philip [name] presents today for a follow-up visit. Since the last visit, he continues to do very well without any recent complications. He continues to tolerate Taxotere chemotherapy without any complications. He reports some mild fatigue which which is unchanged and resolves quickly. He denied any nausea, abdominal pain or any GI complications. He denied any infusion-related issues or peripheral neuropathy. He does not report any bone pain or back pain. He does report some nail changes associated with Taxotere.  He reports that his lower extremity edema continues to improve and still have some residual left-sided ankle edema. He does wear compression stockings which have helped controlling the edema. He continues to work and perform activities of daily living without any decline. He denies any complications related to Neulasta such as severe arthralgias or myalgias.  He does not report any headaches, blurry vision, syncope or seizures. He does not report any fevers, chills, sweats or weight loss. He does not report any chest pain, palpitation, orthopnea. Does not report any cough, wheezing, hemoptysis or dyspnea on exertion. He does not report any nausea, vomiting, abdominal pain, hematochezia. He does not report any frequency, urgency or hesitancy. Does not report any hematuria or dysuria. He does not report any arthralgias,  myalgias. Does not report any lymphadenopathy or petechiae. Does not report any change in his mood including depression or anxiety. Remaining review of systems unremarkable  Medications: I have reviewed the patient's current medications.   Current Outpatient Prescriptions  Medication Sig Dispense Refill  . BIOTIN 5000 PO Take 1 tablet by mouth daily.    . Calcium Carbonate-Vitamin D3 (CALCIUM 600+D3) 600-400 MG-UNIT TABS Take 1 tablet by mouth daily.    . fexofenadine (ALLEGRA) 180 MG tablet Take 180 mg by mouth daily.    . fluticasone (FLONASE) 50 MCG/ACT nasal spray Place 1 spray into both nostrils daily as needed for allergies or rhinitis.    . Glycerin-Polysorbate 80 (REFRESH DRY EYE THERAPY OP) Apply 1 drop to eye daily.    . halobetasol (ULTRAVATE) 0.05 % ointment Apply 1 application topically as needed. For exzema.    . hydrOXYzine (ATARAX/VISTARIL) 10 MG tablet Take 10 mg by mouth every 8 (eight) hours.  4  . lidocaine-prilocaine (EMLA) cream Apply 1 application topically as needed. Apply to portacath before each chemotherapy. 30 g 0  . Multiple Vitamin (MULTIVITAMIN WITH MINERALS) TABS tablet Take 1 tablet by mouth daily.    . ondansetron (ZOFRAN) 4 MG tablet Take 1 tablet (4 mg total) by mouth every 8 (eight) hours as needed for nausea or vomiting. 20 tablet 0  . terbinafine (LAMISIL) 1 % cream Apply 1 application topically 2 (two) times daily.    Marland Kitchen tretinoin (RETIN-A) 0.1 % cream Apply 1 application topically at bedtime.     No current facility-administered medications for this visit.     Allergies: No Known Allergies  Past Medical History, Surgical history, Social history, and Family History were  reviewed and updated.   Physical Exam: Blood pressure 138/70, pulse 80, temperature 97.4 F (36.3 C), temperature source Oral, resp. rate 18, height 6\' 4"  (1.93 m), weight 208 lb (94.348 kg), SpO2 98 %. ECOG: 0 General appearance: alert and cooperative.  Head: Normocephalic,  without obvious abnormality No ulcers or lesions. Neck: no adenopathy Lymph nodes: Cervical, supraclavicular, and axillary nodes normal. Heart:regular rate and rhythm, S1, S2 normal, no murmur, click, rub or gallop Lung:chest clear, no wheezing, rales, normal symmetric air entry Abdomin: soft, non-tender, without masses or organomegaly no rebound or guarding. EXT:no erythema, induration, or nodules   Lab Results: Lab Results  Component Value Date   WBC 7.2 06/01/2015   HGB 10.4* 06/01/2015   HCT 31.7* 06/01/2015   MCV 96.1 06/01/2015   PLT 210 06/01/2015     Chemistry      Component Value Date/Time   NA 135* 05/11/2015 1048   NA 136 02/09/2015 1305   K 4.1 05/11/2015 1048   K 4.3 02/09/2015 1305   CL 104 02/09/2015 1305   CO2 24 05/11/2015 1048   CO2 24 02/09/2015 1305   BUN 19.0 05/11/2015 1048   BUN 18 02/09/2015 1305   CREATININE 0.8 05/11/2015 1048   CREATININE 0.76 02/09/2015 1305      Component Value Date/Time   CALCIUM 9.1 05/11/2015 1048   CALCIUM 9.1 02/09/2015 1305   ALKPHOS 182* 05/11/2015 1048   AST 18 05/11/2015 1048   ALT 13 05/11/2015 1048   BILITOT 0.41 05/11/2015 1048      Results for Philip [name], Philip [name] (MRN [id number]) as of 06/01/2015 08:10  Ref. Range 03/30/2015 08:21 04/20/2015 08:20 04/20/2015 08:20 05/11/2015 10:48  PSA Latest Ref Range: 0.0-4.0 ng/mL 22.2 (H) 14.57 (H) 14.6 (H) 12.4 (H)      Impression and Plan:  70 year old gentleman with the following issues:  1. Advanced prostate cancer diagnosed in October 2016. He presented with PSA of 2900 and unknown Gleason score. His disease includes widespread bony metastasis as well as pelvic adenopathy. He is status post bilateral orchiectomy done in November 2016.  He is on Taxotere at 75 mg/m And have tolerated it well without any complications. The plan is to proceed with cycle 6 today without any dose reduction or delay.  The plan is to proceed with a short interval of active surveillance and  repeat imaging studies including a CT scan and a bone scan in 6 weeks. Depending on these findings as well as on his PSA for the next 6 weeks will determine whether he will need second line hormonal therapy at that time. His PSA response has plateaued with PSA close to 12 at this time.  2. IV access: Port-A-Cath  in place without complications. This will be flushed every 6 weeks upon completion of chemotherapy.  3. Antiemetics: Prescription for Zofran is available to the patient. Nausea have not been an issue at this time.  4. Neutropenia prophylaxis: He will require Neulasta after each infusion. He has not experienced any complications such as arthralgias, myalgias or injection-related issues.  5. Hormone therapy: He is status post bilateral orchiectomy. He did not require second line hormone therapy and his PSA starts to rise.  6. Bone directed therapy: He is an excellent candidate for Xgeva once he is up to his dental clearance. He was planned to have dental surgery which will be done in the near future after completing chemotherapy.  7. Anemia: Likely related to chronic disease and malignancy. Counts today appear to be  stable.  8. Follow-up: He will also be in 3 weeks to assess any complications upon completion of chemotherapy.    [facility], MD 3/31/20178:34 AM

## 2015-06-01 NOTE — Telephone Encounter (Signed)
Gave and printed appt sched and avs for pt for April and May...gv barium °

## 2015-06-02 LAB — PSA: Prostate Specific Ag, Serum: 10.2 ng/mL — ABNORMAL HIGH (ref 0.0–4.0)

## 2015-06-04 ENCOUNTER — Telehealth: Payer: Self-pay | Admitting: *Deleted

## 2015-06-04 ENCOUNTER — Ambulatory Visit (HOSPITAL_BASED_OUTPATIENT_CLINIC_OR_DEPARTMENT_OTHER): Payer: Medicare Other

## 2015-06-04 VITALS — BP 137/75 | HR 90 | Temp 98.5°F

## 2015-06-04 DIAGNOSIS — C7951 Secondary malignant neoplasm of bone: Secondary | ICD-10-CM | POA: Diagnosis not present

## 2015-06-04 DIAGNOSIS — C61 Malignant neoplasm of prostate: Secondary | ICD-10-CM | POA: Diagnosis not present

## 2015-06-04 MED ORDER — PEGFILGRASTIM INJECTION 6 MG/0.6ML ~~LOC~~
6.0000 mg | PREFILLED_SYRINGE | Freq: Once | SUBCUTANEOUS | Status: AC
  Administered 2015-06-04: 6 mg via SUBCUTANEOUS
  Filled 2015-06-04: qty 0.6

## 2015-06-04 NOTE — Telephone Encounter (Signed)
As noted below by Dr. Alen Blew, I left a message informing him that his PSA is 10.2.

## 2015-06-04 NOTE — Telephone Encounter (Signed)
-----   Message from [name], MD sent at 06/04/2015  8:46 AM EDT ----- Please let him know his PSA. Down again.

## 2015-06-21 ENCOUNTER — Other Ambulatory Visit: Payer: Self-pay

## 2015-06-21 DIAGNOSIS — Z95828 Presence of other vascular implants and grafts: Secondary | ICD-10-CM

## 2015-06-22 ENCOUNTER — Ambulatory Visit (HOSPITAL_BASED_OUTPATIENT_CLINIC_OR_DEPARTMENT_OTHER): Payer: Medicare Other | Admitting: Oncology

## 2015-06-22 ENCOUNTER — Other Ambulatory Visit (HOSPITAL_BASED_OUTPATIENT_CLINIC_OR_DEPARTMENT_OTHER): Payer: Medicare Other

## 2015-06-22 ENCOUNTER — Ambulatory Visit: Payer: Medicare Other

## 2015-06-22 ENCOUNTER — Ambulatory Visit (HOSPITAL_BASED_OUTPATIENT_CLINIC_OR_DEPARTMENT_OTHER): Payer: Medicare Other

## 2015-06-22 VITALS — BP 126/80 | HR 76 | Temp 97.9°F | Resp 18 | Ht 76.0 in | Wt 206.1 lb

## 2015-06-22 DIAGNOSIS — C7951 Secondary malignant neoplasm of bone: Secondary | ICD-10-CM

## 2015-06-22 DIAGNOSIS — C61 Malignant neoplasm of prostate: Secondary | ICD-10-CM

## 2015-06-22 DIAGNOSIS — D649 Anemia, unspecified: Secondary | ICD-10-CM

## 2015-06-22 DIAGNOSIS — Z95828 Presence of other vascular implants and grafts: Secondary | ICD-10-CM

## 2015-06-22 LAB — CBC WITH DIFFERENTIAL/PLATELET
BASO%: 0.7 % (ref 0.0–2.0)
Basophils Absolute: 0 10*3/uL (ref 0.0–0.1)
EOS ABS: 0 10*3/uL (ref 0.0–0.5)
EOS%: 0.2 % (ref 0.0–7.0)
HCT: 32.3 % — ABNORMAL LOW (ref 38.4–49.9)
HGB: 10.7 g/dL — ABNORMAL LOW (ref 13.0–17.1)
LYMPH%: 20.4 % (ref 14.0–49.0)
MCH: 31.6 pg (ref 27.2–33.4)
MCHC: 33.2 g/dL (ref 32.0–36.0)
MCV: 95.2 fL (ref 79.3–98.0)
MONO#: 0.6 10*3/uL (ref 0.1–0.9)
MONO%: 11.3 % (ref 0.0–14.0)
NEUT%: 67.4 % (ref 39.0–75.0)
NEUTROS ABS: 3.4 10*3/uL (ref 1.5–6.5)
Platelets: 215 10*3/uL (ref 140–400)
RBC: 3.4 10*6/uL — AB (ref 4.20–5.82)
RDW: 16.7 % — ABNORMAL HIGH (ref 11.0–14.6)
WBC: 5.1 10*3/uL (ref 4.0–10.3)
lymph#: 1 10*3/uL (ref 0.9–3.3)

## 2015-06-22 LAB — COMPREHENSIVE METABOLIC PANEL
ALT: 11 U/L (ref 0–55)
AST: 17 U/L (ref 5–34)
Albumin: 3.7 g/dL (ref 3.5–5.0)
Alkaline Phosphatase: 132 U/L (ref 40–150)
Anion Gap: 8 mEq/L (ref 3–11)
BILIRUBIN TOTAL: 0.39 mg/dL (ref 0.20–1.20)
BUN: 17.4 mg/dL (ref 7.0–26.0)
CO2: 24 meq/L (ref 22–29)
Calcium: 9.3 mg/dL (ref 8.4–10.4)
Chloride: 105 mEq/L (ref 98–109)
Creatinine: 0.8 mg/dL (ref 0.7–1.3)
EGFR: 88 mL/min/{1.73_m2} — AB (ref >90)
GLUCOSE: 98 mg/dL (ref 70–140)
POTASSIUM: 4.2 meq/L (ref 3.5–5.1)
SODIUM: 137 meq/L (ref 136–145)
TOTAL PROTEIN: 6.4 g/dL (ref 6.4–8.3)

## 2015-06-22 MED ORDER — SODIUM CHLORIDE 0.9 % IJ SOLN
10.0000 mL | INTRAMUSCULAR | Status: DC | PRN
  Administered 2015-06-22: 10 mL via INTRAVENOUS
  Filled 2015-06-22: qty 10

## 2015-06-22 MED ORDER — HEPARIN SOD (PORK) LOCK FLUSH 100 UNIT/ML IV SOLN
500.0000 [IU] | Freq: Once | INTRAVENOUS | Status: AC
  Administered 2015-06-22: 500 [IU] via INTRAVENOUS
  Filled 2015-06-22: qty 5

## 2015-06-22 NOTE — Patient Instructions (Signed)

## 2015-06-22 NOTE — Progress Notes (Signed)
Hematology and Oncology Follow Up Visit  Philip [name] [id number] Oct 12, 1945 70 y.o. 06/22/2015 9:04 AM No PCP Per PatientNo ref. provider found   Principle Diagnosis: 70 year old gentleman with advanced prostate cancer diagnosed in October 2016. He presented with a PSA of 2900 and widespread bone metastasis as well as pelvic adenopathy.    Prior Therapy: Status post orchiectomy done on November 2016. Status post port placement on 02/09/2015. Taxotere chemotherapy at 75 mg/m to start on 02/16/2015. He is status post 6 cycles of therapy last cycle received in March 2017.  Current therapy: Observation and surveillance.  Interim History:  [name] presents today for a follow-up visit. Since the last visit, he reports recovery from last chemotherapy without new complaints. He reports some mild fatigue which which is improving. He denied any nausea, abdominal pain or any GI complications. He denied any peripheral neuropathy. He does not report any bone pain or back pain. He does report some nail changes associated with Taxotere.  He reports that his lower extremity edema continues to improve and still have some residual left-sided ankle edema. He reports some occasional groin pain with mobility but otherwise he is able to perform all activities of daily living without any decline. He is enjoying an excellent quality of life and performance status.  He does not report any headaches, blurry vision, syncope or seizures. He does not report any fevers, chills, sweats or weight loss. He does not report any chest pain, palpitation, orthopnea. Does not report any cough, wheezing, hemoptysis or dyspnea on exertion. He does not report any nausea, vomiting, abdominal pain, hematochezia. He does not report any frequency, urgency or hesitancy. Does not report any hematuria or dysuria. He does not report any arthralgias, myalgias. Does not report any lymphadenopathy or petechiae. Does not report any change in his  mood including depression or anxiety. Remaining review of systems unremarkable  Medications: I have reviewed the patient's current medications.   Current Outpatient Prescriptions  Medication Sig Dispense Refill  . BIOTIN 5000 PO Take 1 tablet by mouth daily.    . Calcium Carbonate-Vitamin D3 (CALCIUM 600+D3) 600-400 MG-UNIT TABS Take 1 tablet by mouth daily.    . fexofenadine (ALLEGRA) 180 MG tablet Take 180 mg by mouth daily.    . fluticasone (FLONASE) 50 MCG/ACT nasal spray Place 1 spray into both nostrils daily as needed for allergies or rhinitis.    . Glycerin-Polysorbate 80 (REFRESH DRY EYE THERAPY OP) Apply 1 drop to eye daily.    . halobetasol (ULTRAVATE) 0.05 % ointment Apply 1 application topically as needed. For exzema.    . hydrOXYzine (ATARAX/VISTARIL) 10 MG tablet Take 10 mg by mouth every 8 (eight) hours.  4  . lidocaine-prilocaine (EMLA) cream Apply 1 application topically as needed. Apply to portacath before each chemotherapy. 30 g 0  . Multiple Vitamin (MULTIVITAMIN WITH MINERALS) TABS tablet Take 1 tablet by mouth daily.    . ondansetron (ZOFRAN) 4 MG tablet Take 1 tablet (4 mg total) by mouth every 8 (eight) hours as needed for nausea or vomiting. 20 tablet 0  . terbinafine (LAMISIL) 1 % cream Apply 1 application topically 2 (two) times daily.    Marland Kitchen tretinoin (RETIN-A) 0.1 % cream Apply 1 application topically at bedtime.     No current facility-administered medications for this visit.     Allergies: No Known Allergies  Past Medical History, Surgical history, Social history, and Family History were reviewed and updated.   Physical Exam: Blood pressure 126/80, pulse 76,  temperature 97.9 F (36.6 C), temperature source Oral, resp. rate 18, height 6\' 4"  (1.93 m), weight 206 lb 1.6 oz (93.486 kg), SpO2 99 %. ECOG: 0 General appearance: alert and cooperative. Appeared without distress. Head: Normocephalic, without obvious abnormality No oral thrush noted. Neck: no  adenopathy Lymph nodes: Cervical, supraclavicular, and axillary nodes normal. Heart:regular rate and rhythm, S1, S2 normal, no murmur, click, rub or gallop Lung:chest clear, no wheezing, rales, normal symmetric air entry Abdomin: soft, non-tender, without masses or organomegaly no shifting dullness or ascites. EXT:no erythema, induration, or nodules   Lab Results: Lab Results  Component Value Date   WBC 5.1 06/22/2015   HGB 10.7* 06/22/2015   HCT 32.3* 06/22/2015   MCV 95.2 06/22/2015   PLT 215 06/22/2015     Chemistry      Component Value Date/Time   NA 136 06/01/2015 0748   NA 136 02/09/2015 1305   K 4.2 06/01/2015 0748   K 4.3 02/09/2015 1305   CL 104 02/09/2015 1305   CO2 25 06/01/2015 0748   CO2 24 02/09/2015 1305   BUN 17.2 06/01/2015 0748   BUN 18 02/09/2015 1305   CREATININE 0.8 06/01/2015 0748   CREATININE 0.76 02/09/2015 1305      Component Value Date/Time   CALCIUM 9.0 06/01/2015 0748   CALCIUM 9.1 02/09/2015 1305   ALKPHOS 137 06/01/2015 0748   AST 15 06/01/2015 0748   ALT 11 06/01/2015 0748   BILITOT 0.33 06/01/2015 0748           Impression and Plan:  70 year old gentleman with the following issues:  1. Advanced prostate cancer diagnosed in October 2016. He presented with PSA of 2900 and unknown Gleason score. His disease includes widespread bony metastasis as well as pelvic adenopathy. He is status post bilateral orchiectomy done in November 2016.  He is S/P Taxotere at 75 mg/m for a total of 6 cycles that is well-tolerated.  The plan is to restage him with a CT scan and bone scan and a repeat PSA in 3 weeks. Different salvage therapy will be used upon progression. Likely would benefit from [place] if he develops rise in his PSA.  2. IV access: Port-A-Cath  in place without complications. This will be flushed every 6 weeks upon completion of chemotherapy.  3. Antiemetics: Prescription for Zofran is available to him but nausea have  resolved at this time.  4. Neutropenia prophylaxis: He will no longer require Neulasta.  5. Hormone therapy: He is status post bilateral orchiectomy.   6. Bone directed therapy: He is an excellent candidate for Xgeva once he is up to his dental clearance.   7. Anemia: Likely related to chronic disease and malignancy. Counts today appear to be stable.  8. Follow-up: He will also be in 3 weeks to discuss the results of his staging workup.    [facility], MD 4/21/20179:04 AM

## 2015-06-23 LAB — PSA: PROSTATE SPECIFIC AG, SERUM: 7.9 ng/mL — AB (ref 0.0–4.0)

## 2015-06-25 ENCOUNTER — Ambulatory Visit: Payer: Medicare Other

## 2015-06-25 ENCOUNTER — Telehealth: Payer: Self-pay | Admitting: *Deleted

## 2015-06-25 NOTE — Telephone Encounter (Signed)
Spoke with patient, gave results of last PSA 

## 2015-06-25 NOTE — Telephone Encounter (Signed)
-----   Message from [name], MD sent at 06/25/2015  8:49 AM EDT ----- Please let him know his PSA. Down again.

## 2015-07-16 ENCOUNTER — Other Ambulatory Visit: Payer: Self-pay | Admitting: *Deleted

## 2015-07-16 DIAGNOSIS — C61 Malignant neoplasm of prostate: Secondary | ICD-10-CM

## 2015-07-17 ENCOUNTER — Ambulatory Visit (HOSPITAL_COMMUNITY)
Admission: RE | Admit: 2015-07-17 | Discharge: 2015-07-17 | Disposition: A | Discharge status: Home / Self Care | Payer: Medicare Other | Source: Ambulatory Visit | Attending: Oncology | Admitting: Oncology

## 2015-07-17 ENCOUNTER — Encounter (HOSPITAL_COMMUNITY)
Admission: RE | Admit: 2015-07-17 | Discharge: 2015-07-17 | Disposition: A | Discharge status: Home / Self Care | Payer: Medicare Other | Source: Ambulatory Visit | Attending: Oncology | Admitting: Oncology

## 2015-07-17 ENCOUNTER — Encounter (HOSPITAL_COMMUNITY): Payer: Self-pay

## 2015-07-17 ENCOUNTER — Ambulatory Visit (HOSPITAL_BASED_OUTPATIENT_CLINIC_OR_DEPARTMENT_OTHER): Payer: Medicare Other

## 2015-07-17 ENCOUNTER — Other Ambulatory Visit (HOSPITAL_BASED_OUTPATIENT_CLINIC_OR_DEPARTMENT_OTHER): Payer: Medicare Other

## 2015-07-17 VITALS — BP 136/80 | HR 80 | Temp 98.0°F | Resp 16

## 2015-07-17 DIAGNOSIS — K7689 Other specified diseases of liver: Secondary | ICD-10-CM | POA: Diagnosis not present

## 2015-07-17 DIAGNOSIS — C61 Malignant neoplasm of prostate: Secondary | ICD-10-CM

## 2015-07-17 DIAGNOSIS — I2510 Atherosclerotic heart disease of native coronary artery without angina pectoris: Secondary | ICD-10-CM | POA: Insufficient documentation

## 2015-07-17 DIAGNOSIS — C7951 Secondary malignant neoplasm of bone: Secondary | ICD-10-CM | POA: Insufficient documentation

## 2015-07-17 DIAGNOSIS — C772 Secondary and unspecified malignant neoplasm of intra-abdominal lymph nodes: Secondary | ICD-10-CM | POA: Insufficient documentation

## 2015-07-17 DIAGNOSIS — C775 Secondary and unspecified malignant neoplasm of intrapelvic lymph nodes: Secondary | ICD-10-CM | POA: Diagnosis not present

## 2015-07-17 DIAGNOSIS — N289 Disorder of kidney and ureter, unspecified: Secondary | ICD-10-CM | POA: Insufficient documentation

## 2015-07-17 LAB — CBC WITH DIFFERENTIAL/PLATELET
BASO%: 0.2 % (ref 0.0–2.0)
BASOS ABS: 0 10*3/uL (ref 0.0–0.1)
EOS%: 1.2 % (ref 0.0–7.0)
Eosinophils Absolute: 0.1 10*3/uL (ref 0.0–0.5)
HEMATOCRIT: 36.7 % — AB (ref 38.4–49.9)
HEMOGLOBIN: 12.2 g/dL — AB (ref 13.0–17.1)
LYMPH#: 1.4 10*3/uL (ref 0.9–3.3)
LYMPH%: 27.9 % (ref 14.0–49.0)
MCH: 31.5 pg (ref 27.2–33.4)
MCHC: 33.2 g/dL (ref 32.0–36.0)
MCV: 94.8 fL (ref 79.3–98.0)
MONO#: 0.4 10*3/uL (ref 0.1–0.9)
MONO%: 8.8 % (ref 0.0–14.0)
NEUT#: 3 10*3/uL (ref 1.5–6.5)
NEUT%: 61.9 % (ref 39.0–75.0)
Platelets: 206 10*3/uL (ref 140–400)
RBC: 3.87 10*6/uL — ABNORMAL LOW (ref 4.20–5.82)
RDW: 14.4 % (ref 11.0–14.6)
WBC: 4.9 10*3/uL (ref 4.0–10.3)
nRBC: 0 % (ref 0–0)

## 2015-07-17 LAB — COMPREHENSIVE METABOLIC PANEL
ALBUMIN: 4 g/dL (ref 3.5–5.0)
ALK PHOS: 209 U/L — AB (ref 40–150)
ALT: 15 U/L (ref 0–55)
AST: 18 U/L (ref 5–34)
Anion Gap: 9 mEq/L (ref 3–11)
BUN: 14.5 mg/dL (ref 7.0–26.0)
CALCIUM: 9.8 mg/dL (ref 8.4–10.4)
CO2: 25 mEq/L (ref 22–29)
Chloride: 102 mEq/L (ref 98–109)
Creatinine: 0.8 mg/dL (ref 0.7–1.3)
EGFR: 90 mL/min/{1.73_m2} — AB (ref >90)
Glucose: 98 mg/dl (ref 70–140)
POTASSIUM: 4.2 meq/L (ref 3.5–5.1)
SODIUM: 136 meq/L (ref 136–145)
Total Bilirubin: 0.36 mg/dL (ref 0.20–1.20)
Total Protein: 6.8 g/dL (ref 6.4–8.3)

## 2015-07-17 MED ORDER — TECHNETIUM TC 99M MEDRONATE IV KIT
26.3000 | PACK | Freq: Once | INTRAVENOUS | Status: AC | PRN
  Administered 2015-07-17: 26.3 via INTRAVENOUS

## 2015-07-17 MED ORDER — SODIUM CHLORIDE 0.9 % IJ SOLN
10.0000 mL | Freq: Once | INTRAMUSCULAR | Status: AC
  Administered 2015-07-17: 10 mL
  Filled 2015-07-17: qty 10

## 2015-07-17 MED ORDER — IOPAMIDOL (ISOVUE-300) INJECTION 61%
100.0000 mL | Freq: Once | INTRAVENOUS | Status: AC | PRN
  Administered 2015-07-17: 100 mL via INTRAVENOUS

## 2015-07-18 LAB — PSA: PROSTATE SPECIFIC AG, SERUM: 7 ng/mL — AB (ref 0.0–4.0)

## 2015-07-18 LAB — TESTOSTERONE

## 2015-07-20 ENCOUNTER — Telehealth: Payer: Self-pay | Admitting: Oncology

## 2015-07-20 ENCOUNTER — Ambulatory Visit (HOSPITAL_BASED_OUTPATIENT_CLINIC_OR_DEPARTMENT_OTHER): Payer: Medicare Other | Admitting: Oncology

## 2015-07-20 VITALS — BP 140/79 | HR 75 | Temp 98.2°F | Resp 18 | Ht 76.0 in | Wt 205.4 lb

## 2015-07-20 DIAGNOSIS — D649 Anemia, unspecified: Secondary | ICD-10-CM

## 2015-07-20 DIAGNOSIS — C61 Malignant neoplasm of prostate: Secondary | ICD-10-CM | POA: Diagnosis not present

## 2015-07-20 DIAGNOSIS — C7951 Secondary malignant neoplasm of bone: Secondary | ICD-10-CM

## 2015-07-20 NOTE — Telephone Encounter (Signed)
Gave and printed appt sched and avs for pt for June °

## 2015-07-20 NOTE — Progress Notes (Signed)
Hematology and Oncology Follow Up Visit  Philip [name] [id number] 12-02-1945 70 y.o. 07/20/2015 9:55 AM No PCP Per PatientNo ref. provider found   Principle Diagnosis: 70 year old gentleman with advanced prostate cancer diagnosed in October 2016. He presented with a PSA of 2900 and widespread bone metastasis as well as pelvic adenopathy.    Prior Therapy: Status post orchiectomy done on November 2016. Status post port placement on 02/09/2015. Taxotere chemotherapy at 75 mg/m to start on 02/16/2015. He is status post 6 cycles of therapy last cycle received in March 2017.  Current therapy: Observation and surveillance.  Interim History:  Philip [name] presents today for a follow-up visit. Since the last visit, he reports no major changes in his health. He have fully recovered from his last chemotherapy at this time. He still complains of right groin pain which has been chronic in nature. He reports some mild fatigue which which is improving. He denied any nausea, abdominal pain or any GI complications. He denied any peripheral neuropathy. He does not report any bone pain or back pain. He does report some nail changes associated with Taxotere. He continues to work close to full time at this time without any decline his energy her performance status.   He does not report any headaches, blurry vision, syncope or seizures. He does not report any fevers, chills, sweats or weight loss. He does not report any chest pain, palpitation, orthopnea. Does not report any cough, wheezing, hemoptysis or dyspnea on exertion. He does not report any nausea, vomiting, abdominal pain, hematochezia. He does not report any frequency, urgency or hesitancy. Does not report any hematuria or dysuria. He does not report any arthralgias, myalgias. Does not report any lymphadenopathy or petechiae. Does not report any change in his mood including depression or anxiety. Remaining review of systems unremarkable  Medications: I have  reviewed the patient's current medications.   Current Outpatient Prescriptions  Medication Sig Dispense Refill  . BIOTIN 5000 PO Take 1 tablet by mouth daily.    . Calcium Carbonate-Vitamin D3 (CALCIUM 600+D3) 600-400 MG-UNIT TABS Take 1 tablet by mouth daily.    . fexofenadine (ALLEGRA) 180 MG tablet Take 180 mg by mouth daily.    . fluticasone (FLONASE) 50 MCG/ACT nasal spray Place 1 spray into both nostrils daily as needed for allergies or rhinitis.    . Glycerin-Polysorbate 80 (REFRESH DRY EYE THERAPY OP) Apply 1 drop to eye daily.    . halobetasol (ULTRAVATE) 0.05 % ointment Apply 1 application topically as needed. For exzema.    . hydrOXYzine (ATARAX/VISTARIL) 10 MG tablet Take 10 mg by mouth every 8 (eight) hours.  4  . lidocaine-prilocaine (EMLA) cream Apply 1 application topically as needed. Apply to portacath before each chemotherapy. 30 g 0  . Multiple Vitamin (MULTIVITAMIN WITH MINERALS) TABS tablet Take 1 tablet by mouth daily.    . ondansetron (ZOFRAN) 4 MG tablet Take 1 tablet (4 mg total) by mouth every 8 (eight) hours as needed for nausea or vomiting. 20 tablet 0  . terbinafine (LAMISIL) 1 % cream Apply 1 application topically 2 (two) times daily.    Marland Kitchen tretinoin (RETIN-A) 0.1 % cream Apply 1 application topically at bedtime.     No current facility-administered medications for this visit.     Allergies: No Known Allergies  Past Medical History, Surgical history, Social history, and Family History were reviewed and updated.   Physical Exam: Blood pressure 140/79, pulse 75, temperature 98.2 F (36.8 C), temperature source Oral, resp. rate  18, height 6\' 4"  (1.93 m), weight 205 lb 6.4 oz (93.169 kg), SpO2 100 %. ECOG: 0 General appearance: Alert, pleasant gentleman without distress. Head: Normocephalic, without obvious abnormality No oral ulcers or lesions. Neck: no adenopathy Lymph nodes: Cervical, supraclavicular, and axillary nodes normal. Heart:regular rate and  rhythm, S1, S2 normal, no murmur, click, rub or gallop Lung:chest clear, no wheezing, rales, normal symmetric air entry Abdomin: soft, non-tender, without masses or organomegaly no rebound or guarding. EXT:no erythema, induration, or nodules  Results for MEAD, CHEA (MRN [id number]) as of 07/20/2015 09:41  Ref. Range 06/22/2015 08:26 07/17/2015 08:15  PSA Latest Ref Range: 0.0-4.0 ng/mL 7.9 (H) 7.0 (H)     Lab Results: Lab Results  Component Value Date   WBC 4.9 07/17/2015   HGB 12.2* 07/17/2015   HCT 36.7* 07/17/2015   MCV 94.8 07/17/2015   PLT 206 07/17/2015     Chemistry      Component Value Date/Time   NA 136 07/17/2015 0815   NA 136 02/09/2015 1305   K 4.2 07/17/2015 0815   K 4.3 02/09/2015 1305   CL 104 02/09/2015 1305   CO2 25 07/17/2015 0815   CO2 24 02/09/2015 1305   BUN 14.5 07/17/2015 0815   BUN 18 02/09/2015 1305   CREATININE 0.8 07/17/2015 0815   CREATININE 0.76 02/09/2015 1305      Component Value Date/Time   CALCIUM 9.8 07/17/2015 0815   CALCIUM 9.1 02/09/2015 1305   ALKPHOS 209* 07/17/2015 0815   AST 18 07/17/2015 0815   ALT 15 07/17/2015 0815   BILITOT 0.36 07/17/2015 0815       EXAM: NUCLEAR MEDICINE WHOLE BODY BONE SCAN  TECHNIQUE: Whole body anterior and posterior images were obtained approximately 3 hours after intravenous injection of radiopharmaceutical.  RADIOPHARMACEUTICALS: 26.3 mCi Technetium-89m MDP IV  COMPARISON: Nuclear bone scan of December 29, 2014  FINDINGS: There is adequate uptake of the radiopharmaceutical by the skeleton. There is adequate soft tissue clearance and renal activity.  Innumerable foci of increased uptake are noted throughout the calvarium, spine, ribs, and pelvis consistent with known widespread metastatic disease. There is increased uptake in both femoral heads, intertrochanteric regions, and in the left sub trochanteric region greater on the left than on the right. There also foci of  increased uptake within the proximal and mid and distal left femoral shaft similar to that seen previously. Two tiny areas of increased uptake in the mid right femoral shaft are noted.  There is stable foci of increased uptake associated with both shoulders and in the left sternoclavicular joint consistent with metastatic disease. Mildly increased uptake within the first carpometacarpal joints bilaterally is consistent with degenerative change.  IMPRESSION: Widespread bony metastatic disease. Allowing for slight differences in positioning there has not been significant interval change in the appearance of the activity in the left femur or elsewhere.  Plain films of the symptomatic left hip are recommended to exclude occult fracture.  EXAM: CT CHEST, ABDOMEN, AND PELVIS WITH CONTRAST  TECHNIQUE: Multidetector CT imaging of the chest, abdomen and pelvis was performed following the standard protocol during bolus administration of intravenous contrast.  CONTRAST: 144mL ISOVUE-300 IOPAMIDOL (ISOVUE-300) INJECTION 61%  COMPARISON: CT of the abdomen and pelvis 12/29/2014.  FINDINGS: CT CHEST FINDINGS  Mediastinum/Lymph Nodes: Heart size is normal. There is no significant pericardial fluid, thickening or pericardial calcification. No pathologically enlarged mediastinal or hilar lymph nodes. Esophagus is unremarkable in appearance. Aberrant right subclavian artery incidentally noted. No axillary lymphadenopathy. Right internal  jugular single-lumen porta cath with tip terminating at the superior cavoatrial junction.  Lungs/Pleura: No suspicious appearing pulmonary nodules or masses. No acute consolidative airspace disease. No pleural effusions.  Musculoskeletal/Soft Tissues: Numerous blastic lesions throughout the visualized axial and appendicular skeleton, compatible with widespread metastatic disease to the bone. The largest of these lesions is in the right-sided  the T8 vertebral body measuring up to 4.0 x 3.1 cm.  CT ABDOMEN AND PELVIS FINDINGS  Hepatobiliary: 2.1 cm simple cyst in segment 6 of the liver is unchanged. There also other sub cm low-attenuation lesions of the liver which are unchanged and although too small to definitively characterize, these are favored to represent cysts. No suspicious hepatic lesions. No intra or extrahepatic biliary ductal dilatation. Gallbladder is normal in appearance.  Pancreas: No pancreatic mass. No pancreatic ductal dilatation. No pancreatic or peripancreatic fluid or inflammatory changes.  Spleen: Unremarkable.  Adrenals/Urinary Tract: There are low-attenuation lesions in the kidneys bilaterally, compatible with simple cysts, the largest of which is exophytic in the upper pole of the left kidney measuring 1.8 cm in diameter. No suspicious renal lesions. No hydroureteronephrosis. Urinary bladder is unremarkable in appearance. The glands are normal in appearance.  Stomach/Bowel: The appearance of the stomach is normal. There is no pathologic dilatation of small bowel or colon. Normal appendix.  Vascular/Lymphatic: Atherosclerosis throughout the abdominal and pelvic vasculature, without evidence of aneurysm or dissection. Multiple borderline enlarged and enlarged pelvic and retroperitoneal lymph nodes are noted. The largest of these is in the left external iliac nodal station measuring 1.7 cm in short axis (previously 2.4 cm). All previously noted lymph nodes in the pelvis bilaterally (left greater than right) and retroperitoneum appear decreased in size compared to the prior study. The largest retroperitoneal lymph node is in the low left para-aortic nodal station measuring 11 mm in short axis (previously 18 mm).  Reproductive: Prostate glands appears diminutive, significantly smaller than the prior study, currently measuring 4.5 x 3.5 cm. Seminal vesicles are unremarkable in  appearance.  Other: No significant volume of ascites. No pneumoperitoneum.  Musculoskeletal: Again noted are innumerable lytic lesions throughout the visualized axial and appendicular skeleton, the largest of which are throughout the pelvis and bilateral femurs, including a large lesion in the left femoral head/neck. No definite acute displaced pathologic fracture identified at this time.  IMPRESSION: 1. Bilateral (left greater than right) pelvic lymph node metastasis and retroperitoneal nodal metastases appear slightly improved compared to the prior study, as above. 2. No metastatic disease to the lungs. 3. Widespread osseous metastasis appears very similar to the prior examination. 4. No new extra lymphatic or extraosseous metastatic disease noted in the abdomen or pelvis. 5. Atherosclerosis. 6. Additional incidental findings, as above.    Impression and Plan:  70 year old gentleman with the following issues:  1. Advanced prostate cancer diagnosed in October 2016. He presented with PSA of 2900 and unknown Gleason score. His disease includes widespread bony metastasis as well as pelvic adenopathy. He is status post bilateral orchiectomy done in November 2016.  He is S/P Taxotere at 75 mg/m for a total of 6 cycles that is well-tolerated.  His PSA continues to respond most recently down to 7.0. CT scan and bone scan obtained on 07/17/2015 were reviewed today which showed positive response to therapy. The plan is to continue with active surveillance at this time and his second line hormone therapy upon PSA progression.  2. IV access: Port-A-Cath  in place without complications. This will be flushed every 6  weeks upon completion of chemotherapy.  3. Antiemetics: No nausea issues reported at this time.  4. Hormone therapy: He is status post bilateral orchiectomy.   5. Bone directed therapy: We will continue to address this issue with him over the following.  6. Anemia:  Likely related to chronic disease and malignancy. Counts continue to improve after chemotherapy.  7. Follow-up: He will also be in 6 weeks to monitor his clinical status.    [facility], MD 5/19/20179:55 AM

## 2015-08-27 ENCOUNTER — Other Ambulatory Visit: Payer: Self-pay | Admitting: *Deleted

## 2015-08-27 DIAGNOSIS — C61 Malignant neoplasm of prostate: Secondary | ICD-10-CM

## 2015-08-28 ENCOUNTER — Other Ambulatory Visit (HOSPITAL_BASED_OUTPATIENT_CLINIC_OR_DEPARTMENT_OTHER): Payer: Medicare Other

## 2015-08-28 ENCOUNTER — Ambulatory Visit (HOSPITAL_BASED_OUTPATIENT_CLINIC_OR_DEPARTMENT_OTHER): Payer: Medicare Other

## 2015-08-28 VITALS — BP 139/78 | HR 70 | Temp 98.0°F | Resp 16

## 2015-08-28 DIAGNOSIS — C61 Malignant neoplasm of prostate: Secondary | ICD-10-CM | POA: Diagnosis not present

## 2015-08-28 LAB — COMPREHENSIVE METABOLIC PANEL
ALT: 17 U/L (ref 0–55)
ANION GAP: 8 meq/L (ref 3–11)
AST: 18 U/L (ref 5–34)
Albumin: 3.8 g/dL (ref 3.5–5.0)
Alkaline Phosphatase: 175 U/L — ABNORMAL HIGH (ref 40–150)
BILIRUBIN TOTAL: 0.36 mg/dL (ref 0.20–1.20)
BUN: 14.9 mg/dL (ref 7.0–26.0)
CALCIUM: 9.5 mg/dL (ref 8.4–10.4)
CHLORIDE: 103 meq/L (ref 98–109)
CO2: 25 meq/L (ref 22–29)
CREATININE: 0.8 mg/dL (ref 0.7–1.3)
EGFR: 89 mL/min/{1.73_m2} — ABNORMAL LOW (ref >90)
Glucose: 100 mg/dl (ref 70–140)
Potassium: 4.1 mEq/L (ref 3.5–5.1)
Sodium: 137 mEq/L (ref 136–145)
TOTAL PROTEIN: 6.9 g/dL (ref 6.4–8.3)

## 2015-08-28 LAB — CBC WITH DIFFERENTIAL/PLATELET
BASO%: 0.5 % (ref 0.0–2.0)
Basophils Absolute: 0 10*3/uL (ref 0.0–0.1)
EOS%: 1.7 % (ref 0.0–7.0)
Eosinophils Absolute: 0.1 10*3/uL (ref 0.0–0.5)
HEMATOCRIT: 38.8 % (ref 38.4–49.9)
HGB: 12.9 g/dL — ABNORMAL LOW (ref 13.0–17.1)
LYMPH#: 1.4 10*3/uL (ref 0.9–3.3)
LYMPH%: 30.3 % (ref 14.0–49.0)
MCH: 30.4 pg (ref 27.2–33.4)
MCHC: 33.2 g/dL (ref 32.0–36.0)
MCV: 91.4 fL (ref 79.3–98.0)
MONO#: 0.4 10*3/uL (ref 0.1–0.9)
MONO%: 9.3 % (ref 0.0–14.0)
NEUT%: 58.2 % (ref 39.0–75.0)
NEUTROS ABS: 2.7 10*3/uL (ref 1.5–6.5)
PLATELETS: 217 10*3/uL (ref 140–400)
RBC: 4.24 10*6/uL (ref 4.20–5.82)
RDW: 14 % (ref 11.0–14.6)
WBC: 4.6 10*3/uL (ref 4.0–10.3)

## 2015-08-28 MED ORDER — HEPARIN SOD (PORK) LOCK FLUSH 100 UNIT/ML IV SOLN
500.0000 [IU] | Freq: Once | INTRAVENOUS | Status: AC
  Administered 2015-08-28: 500 [IU]
  Filled 2015-08-28: qty 5

## 2015-08-28 MED ORDER — SODIUM CHLORIDE 0.9 % IJ SOLN
10.0000 mL | Freq: Once | INTRAMUSCULAR | Status: AC
  Administered 2015-08-28: 10 mL
  Filled 2015-08-28: qty 10

## 2015-08-29 LAB — TESTOSTERONE: Testosterone, Serum: <3 ng/dL — ABNORMAL LOW (ref 348–1197)

## 2015-08-29 LAB — PSA: Prostate Specific Ag, Serum: 5.4 ng/mL — ABNORMAL HIGH (ref 0.0–4.0)

## 2015-08-30 ENCOUNTER — Telehealth: Payer: Self-pay | Admitting: Pharmacist

## 2015-08-30 ENCOUNTER — Telehealth: Payer: Self-pay | Admitting: Oncology

## 2015-08-30 ENCOUNTER — Ambulatory Visit (HOSPITAL_BASED_OUTPATIENT_CLINIC_OR_DEPARTMENT_OTHER): Payer: Medicare Other | Admitting: Oncology

## 2015-08-30 ENCOUNTER — Encounter: Payer: Self-pay | Admitting: Pharmacist

## 2015-08-30 VITALS — BP 148/80 | HR 77 | Temp 97.7°F | Resp 18 | Ht 76.0 in | Wt 205.5 lb

## 2015-08-30 DIAGNOSIS — C61 Malignant neoplasm of prostate: Secondary | ICD-10-CM | POA: Diagnosis not present

## 2015-08-30 DIAGNOSIS — D649 Anemia, unspecified: Secondary | ICD-10-CM | POA: Diagnosis not present

## 2015-08-30 DIAGNOSIS — C7951 Secondary malignant neoplasm of bone: Secondary | ICD-10-CM | POA: Diagnosis not present

## 2015-08-30 MED ORDER — ABIRATERONE ACETATE 250 MG PO TABS: 1000.0000 mg | ORAL_TABLET | Freq: Every day | ORAL | Status: DC

## 2015-08-30 MED ORDER — PREDNISONE 5 MG PO TABS
5.0000 mg | ORAL_TABLET | Freq: Every day | ORAL | Status: DC
Start: 2015-08-30 — End: 2015-11-06

## 2015-08-30 NOTE — Telephone Encounter (Signed)
Gave and printed appt sched and avs for pt for aug...the patient ok and aware

## 2015-08-30 NOTE — Progress Notes (Signed)
New Rx for Zytiga faxed to [facility].  Awaiting word back on insurance coverage, etc. Kennith Center, Pharm.D., CPP 08/30/2015@2 :02 PM [facility]

## 2015-08-30 NOTE — Telephone Encounter (Signed)
I called pt to determine eligibility for [place] for [employer]  No answer.  I left vm. Pt has no medicare Part D insurance coverage and no other pharmacy benefits.  We now are going to try assistance from manufacturer. If pts total household income for a 2 member household does not exceed $64,960/year, he is likely eligible for assistance. He would need to come in and sign a completed application & bring in [id number] tax return from 2016. I will follow up on Mon (09/03/15). Kennith Center, Pharm.D., CPP 08/30/2015@4 :[facility]

## 2015-08-30 NOTE — Telephone Encounter (Signed)
Pt returned my call & his household income of 2 ppl falls well below the limit for pt assistance. He will come in Mon 7/3 and sign application. Kennith Center, Pharm.D., CPP 08/30/2015@4 :[facility]

## 2015-08-30 NOTE — Progress Notes (Signed)
Hematology and Oncology Follow Up Visit  Philip [name] [id number] 1945-10-18 70 y.o. 08/30/2015 12:34 PM No PCP Per PatientNo ref. provider found   Principle Diagnosis: 70 year old gentleman with advanced prostate cancer diagnosed in October 2016. He presented with a PSA of 2900 and widespread bone metastasis as well as pelvic adenopathy.    Prior Therapy: Status post orchiectomy done on November 2016. Status post port placement on 02/09/2015. Taxotere chemotherapy at 75 mg/m to start on 02/16/2015. He is status post 6 cycles of therapy last cycle received in March 2017.  Current therapy: Observation and surveillance.  Interim History:  Philip [name] presents today for a follow-up visit. Since the last visit, he reports continuing to be in reasonably good health and shape. He still complains of right groin pain which is not dramatically changed. He still have lower extremity swelling right more than the left.  He reports some mild fatigue which which is improving.  He does not report any bone pain or back pain. He continues to work close to full time at this time without any decline his energy her performance status.   He does not report any headaches, blurry vision, syncope or seizures. He does not report any fevers, chills, sweats or weight loss. He does not report any chest pain, palpitation, orthopnea. Does not report any cough, wheezing, hemoptysis or dyspnea on exertion. He does not report any nausea, vomiting, abdominal pain, hematochezia. He does not report any frequency, urgency or hesitancy. Does not report any hematuria or dysuria. He does not report any arthralgias, myalgias. Does not report any lymphadenopathy or petechiae. Does not report any change in his mood including depression or anxiety. Remaining review of systems unremarkable  Medications: I have reviewed the patient's current medications.   Current Outpatient Prescriptions  Medication Sig Dispense Refill  . BIOTIN 5000 PO Take  1 tablet by mouth daily.    . Calcium Carbonate-Vitamin D3 (CALCIUM 600+D3) 600-400 MG-UNIT TABS Take 1 tablet by mouth daily.    . fexofenadine (ALLEGRA) 180 MG tablet Take 180 mg by mouth daily.    . fluticasone (FLONASE) 50 MCG/ACT nasal spray Place 1 spray into both nostrils daily as needed for allergies or rhinitis.    . Glycerin-Polysorbate 80 (REFRESH DRY EYE THERAPY OP) Apply 1 drop to eye daily.    . halobetasol (ULTRAVATE) 0.05 % ointment Apply 1 application topically as needed. For exzema.    . hydrOXYzine (ATARAX/VISTARIL) 10 MG tablet Take 10 mg by mouth every 8 (eight) hours.  4  . lidocaine-prilocaine (EMLA) cream Apply 1 application topically as needed. Apply to portacath before each chemotherapy. 30 g 0  . Multiple Vitamin (MULTIVITAMIN WITH MINERALS) TABS tablet Take 1 tablet by mouth daily.    . ondansetron (ZOFRAN) 4 MG tablet Take 1 tablet (4 mg total) by mouth every 8 (eight) hours as needed for nausea or vomiting. 20 tablet 0  . terbinafine (LAMISIL) 1 % cream Apply 1 application topically 2 (two) times daily.    Marland Kitchen tretinoin (RETIN-A) 0.1 % cream Apply 1 application topically at bedtime.    Marland Kitchen abiraterone Acetate (ZYTIGA) 250 MG tablet Take 4 tablets (1,000 mg total) by mouth daily. Take on an empty stomach 1 hour before or 2 hours after a meal 120 tablet 0  . predniSONE (DELTASONE) 5 MG tablet Take 1 tablet (5 mg total) by mouth daily with breakfast. 60 tablet 0   No current facility-administered medications for this visit.     Allergies: No Known  Allergies  Past Medical History, Surgical history, Social history, and Family History were reviewed and updated.   Physical Exam: Blood pressure 148/80, pulse 77, temperature 97.7 F (36.5 C), temperature source Oral, resp. rate 18, height 6\' 4"  (1.93 m), weight 205 lb 8 oz (93.214 kg), SpO2 99 %. ECOG: 0 General appearance: Pleasant-appearing gentleman without distress. Head: Normocephalic, without obvious abnormality No  oral thrush noted. Neck: no adenopathy Lymph nodes: Cervical, supraclavicular, and axillary nodes normal. Heart:regular rate and rhythm, S1, S2 normal, no murmur, click, rub or gallop Lung:chest clear, no wheezing, rales, normal symmetric air entry Abdomin: soft, non-tender, without masses or organomegaly no shifting dullness or ascites. EXT:no erythema, induration, or nodules   Results for Philip [name], Philip [name] (MRN [id number]) as of 08/30/2015 12:40  Ref. Range 07/17/2015 08:15 08/28/2015 08:18  PSA Latest Ref Range: 0.0-4.0 ng/mL 7.0 (H) 5.4 (H)     Lab Results: Lab Results  Component Value Date   WBC 4.6 08/28/2015   HGB 12.9* 08/28/2015   HCT 38.8 08/28/2015   MCV 91.4 08/28/2015   PLT 217 08/28/2015     Chemistry      Component Value Date/Time   NA 137 08/28/2015 0818   NA 136 02/09/2015 1305   K 4.1 08/28/2015 0818   K 4.3 02/09/2015 1305   CL 104 02/09/2015 1305   CO2 25 08/28/2015 0818   CO2 24 02/09/2015 1305   BUN 14.9 08/28/2015 0818   BUN 18 02/09/2015 1305   CREATININE 0.8 08/28/2015 0818   CREATININE 0.76 02/09/2015 1305      Component Value Date/Time   CALCIUM 9.5 08/28/2015 0818   CALCIUM 9.1 02/09/2015 1305   ALKPHOS 175* 08/28/2015 0818   AST 18 08/28/2015 0818   ALT 17 08/28/2015 0818   BILITOT 0.36 08/28/2015 0818          Impression and Plan:  70 year old gentleman with the following issues:  1. Advanced prostate cancer diagnosed in October 2016. He presented with PSA of 2900 and unknown Gleason score. His disease includes widespread bony metastasis as well as pelvic adenopathy. He is status post bilateral orchiectomy done in November 2016.  He is S/P Taxotere at 75 mg/m for a total of 6 cycles that is well-tolerated.  His PSA has declined slightly to 5.4 and have been relatively stable. His disease continued to be rather aggressive based on the bone scan obtained on 07/17/2015. Options of therapy were reviewed today including continued  observation and surveillance versus adding Zytiga to his regimen. There is evidence to support adding Zytiga to androgen deprivation in the hormone sensitive advanced prostate cancer the benefit is rather substantial and he is interested in pursuing this benefit. Data from the [place] and [place] studies which were published in the [facility]  in June 2017 support these findings.  Risks and benefits of adding Zytiga and prednisone were discussed today. Complications include fatigue, nausea, lower leg edema and hypertension. Hypokalemia is also a possibility. Complications from prednisone could also include hyperglycemia and weight gain.  2. IV access: Port-A-Cath  in place without complications. This will be flushed every 6 weeks upon completion of chemotherapy.  3. Antiemetics: No nausea issues reported at this time. We will continue to monitor this on Zytiga.  4. Hormone therapy: He is status post bilateral orchiectomy.   5. Bone directed therapy: This will be addressed after the start of Zytiga to ensure no complications.  6. Anemia: Likely related to chronic disease and malignancy. Counts continue to improve after  chemotherapy.  7. Follow-up: He will also be in 4 weeks to monitor his clinical status.    Zola Button, MD 6/29/201712:34 PM

## 2015-09-03 ENCOUNTER — Encounter: Payer: Self-pay | Admitting: Pharmacist

## 2015-09-03 NOTE — Progress Notes (Signed)
Oral Chemotherapy Pharmacist Encounter  I spoke with patient for overview of new oral chemotherapy medication: Zytiga.   Counseled patient on administration, dosing, side effects, safe handling, and monitoring. Side effects include but not limited to: fatigue, myalgia, edema, hypertension.  He voiced understanding and appreciation.   No drug interactions detected w/ current meds.  Labs appropriate for tx.  All questions answered.  Handout provided to pt on Zytiga.  I provided pt with our contact information for the oral chemo clinic.  Pt competed & signed application for [employer] Pt assistance program.  Pt provided a copy of 1040 tax return from 2016.  We will await determination from [employer].  Pt aware he may qualify for free drug and a pharmacy card to obtain Zytiga from a pharmacy rather than receiving his Zytiga through the mail.  Pt understands we will call him 1 week after starting Zytiga.  Start date is pending.  Thank you, Kennith Center, Pharm.D., CPP 09/03/2015@9 :33 AM Oral Chemotherapy Clinic

## 2015-09-10 ENCOUNTER — Telehealth: Payer: Self-pay | Admitting: Pharmacist

## 2015-09-10 NOTE — Telephone Encounter (Signed)
Pt called oral chemo clinic to f/u on eligibility for pt assistance for Zytiga from [employer].  I informed him that on 09/07/15 we had to fax a darker copy of his tax records to [employer].    I called [employer] (Ph# [phone]) and s/w rep, Clarence.  Together we reviewed his tax record and pt qualifies based on 2 household income of $49,511.  However, Braulio Conte needed to do a benefits verification w/ [employer] and make sure pt was receiving no pharmacy benefits before case was finalized.  There was no phone # for [employer] on the scanned copy of pts insurance card so I had to call pt back and get the number (Ph# [phone]).   I called back to [employer] and provided a different rep that contact number for [employer].  Pts case is under expedited review and I will call back to [employer] tomorrow (7/11) and follow up.  Pt understands that I will contact him as soon as I have heard something from [employer].  Kennith Center, Pharm.D., CPP 09/10/2015@9 :29 AM Oral Chemo Clinic.

## 2015-09-11 ENCOUNTER — Telehealth: Payer: Self-pay | Admitting: Pharmacist

## 2015-09-11 MED FILL — ZYTIGA 250 MG TABLET: 250 | 30 days supply | Qty: 120 | Fill #0

## 2015-09-11 NOTE — Telephone Encounter (Signed)
Pt received approval from [employer] Pt assistance effective for 1 year. Pt will receive a pharmacy benefit card in the mail in the next 5-7 days and he can fill his Zytiga script at any pharmacy.  I encouraged pt to fill at [facility] since Rx already on file there. I provided [facility] OP Rx number for pt to call to begin the fill process. Pt can call [employer] today and get his ID number (they can only provide it to the pt; not to me or to the filling pharmacy).  I provided the number for the pt to call [employer].  When he has the ID number, he has to give that to the pharmacy to fill the Rx. I have spoken w/ a pharmacist at [facility] and alerted them of this info and that the pt may be calling today.  Note: per [employer], the benefit card is only good until 10/02/15.  Just prior to 10/02/15, they will fax our office correspondence that we will need to complete a product request form to order thru [facility] only.   Pt is unaware of this change that will begin 10/02/15.  I plan to f/u w/ pt again tomorrow by phone.  Kennith Center, Pharm.D., CPP 09/11/2015@2 :[facility]

## 2015-09-12 ENCOUNTER — Telehealth: Payer: Self-pay | Admitting: Pharmacist

## 2015-09-12 NOTE — Telephone Encounter (Signed)
Pt called to let me know he p/u his Zytiga from [facility] and plans to start tx tomorrow (09/13/15). Pt is very appreciative of our help getting him assistance.  We'll call him in next 7-10 days to determine tolerance. His next appts for lab/MD are 10/08/15  Kennith Center, Pharm.D., CPP 09/12/2015@3 :01 PM Oral Chemo Clinic

## 2015-09-13 ENCOUNTER — Encounter: Payer: Self-pay | Admitting: *Deleted

## 2015-09-13 ENCOUNTER — Telehealth: Payer: Self-pay | Admitting: *Deleted

## 2015-09-13 NOTE — Telephone Encounter (Signed)
Patient called and stated,"I wanted Dr. Alen Blew know that I started Zytiga and Prednisone today." Instructed patient I would tell Dr. Alen Blew.

## 2015-09-26 DIAGNOSIS — H2513 Age-related nuclear cataract, bilateral: Secondary | ICD-10-CM | POA: Diagnosis not present

## 2015-09-26 DIAGNOSIS — H25013 Cortical age-related cataract, bilateral: Secondary | ICD-10-CM | POA: Diagnosis not present

## 2015-10-01 ENCOUNTER — Telehealth: Payer: Self-pay | Admitting: Pharmacist

## 2015-10-01 NOTE — Telephone Encounter (Signed)
Oral Chemotherapy Pharmacist Encounter  Called [employer] patient assistance foundation to request form needed to fill Zytiga Rx thru [employer]. They will fax to [phone]. I will fill out and return form as soon as it is received.  Johny Drilling, PharmD, BCPS Oral Chemotherapy Clinic

## 2015-10-02 ENCOUNTER — Telehealth: Payer: Self-pay | Admitting: Pharmacist

## 2015-10-02 ENCOUNTER — Other Ambulatory Visit: Payer: Self-pay | Admitting: *Deleted

## 2015-10-02 ENCOUNTER — Encounter: Payer: Self-pay | Admitting: Pharmacist

## 2015-10-02 DIAGNOSIS — C61 Malignant neoplasm of prostate: Secondary | ICD-10-CM

## 2015-10-02 MED ORDER — ABIRATERONE ACETATE 250 MG PO TABS: 1000.0000 mg | ORAL_TABLET | Freq: Every day | ORAL | 0 refills | Status: DC

## 2015-10-02 NOTE — Progress Notes (Signed)
Dixie, RN faxed prescription request form for [place] for [facility] on 10/01/15  Johny Drilling, PharmD, BCPS Pharmacy: [phone] Oral Chemo Clinic: [phone] 10/02/2015 4:25 PM

## 2015-10-02 NOTE — Telephone Encounter (Signed)
Oral Chemotherapy Pharmacist Encounter  Followed-up with [employer] patient assistance foundation to request form needed to fill Zytiga Rx thru [employer] since it was not received 7/31.   They will fax again to [phone].   I verified fax number with Curt Bears, they had an incorrect number on file despite relaying fax number over the phone yesterday.  I will fill out and return form as soon as it is received.  Curt Bears from [employer] stated she would call back this afternoon to ensure form was received.  Johny Drilling, PharmD, BCPS Oral Chemotherapy Clinic

## 2015-10-08 ENCOUNTER — Ambulatory Visit (HOSPITAL_BASED_OUTPATIENT_CLINIC_OR_DEPARTMENT_OTHER): Payer: Medicare Other

## 2015-10-08 ENCOUNTER — Other Ambulatory Visit (HOSPITAL_BASED_OUTPATIENT_CLINIC_OR_DEPARTMENT_OTHER): Payer: Medicare Other

## 2015-10-08 ENCOUNTER — Other Ambulatory Visit: Payer: Self-pay | Admitting: Oncology

## 2015-10-08 DIAGNOSIS — Z95828 Presence of other vascular implants and grafts: Secondary | ICD-10-CM

## 2015-10-08 DIAGNOSIS — C61 Malignant neoplasm of prostate: Secondary | ICD-10-CM

## 2015-10-08 LAB — COMPREHENSIVE METABOLIC PANEL
ALBUMIN: 3.8 g/dL (ref 3.5–5.0)
ALK PHOS: 121 U/L (ref 40–150)
ALT: 16 U/L (ref 0–55)
AST: 18 U/L (ref 5–34)
Anion Gap: 9 mEq/L (ref 3–11)
BILIRUBIN TOTAL: 0.51 mg/dL (ref 0.20–1.20)
BUN: 13.4 mg/dL (ref 7.0–26.0)
CALCIUM: 9.6 mg/dL (ref 8.4–10.4)
CO2: 24 mEq/L (ref 22–29)
Chloride: 104 mEq/L (ref 98–109)
Creatinine: 0.8 mg/dL (ref 0.7–1.3)
EGFR: 89 mL/min/{1.73_m2} — AB (ref >90)
GLUCOSE: 102 mg/dL (ref 70–140)
Potassium: 4.1 mEq/L (ref 3.5–5.1)
SODIUM: 138 meq/L (ref 136–145)
TOTAL PROTEIN: 6.9 g/dL (ref 6.4–8.3)

## 2015-10-08 LAB — CBC WITH DIFFERENTIAL/PLATELET
BASO%: 0.5 % (ref 0.0–2.0)
BASOS ABS: 0 10*3/uL (ref 0.0–0.1)
EOS ABS: 0.1 10*3/uL (ref 0.0–0.5)
EOS%: 1.6 % (ref 0.0–7.0)
HEMATOCRIT: 39.1 % (ref 38.4–49.9)
HEMOGLOBIN: 12.9 g/dL — AB (ref 13.0–17.1)
LYMPH#: 1.2 10*3/uL (ref 0.9–3.3)
LYMPH%: 19.5 % (ref 14.0–49.0)
MCH: 29.7 pg (ref 27.2–33.4)
MCHC: 33 g/dL (ref 32.0–36.0)
MCV: 90 fL (ref 79.3–98.0)
MONO#: 0.5 10*3/uL (ref 0.1–0.9)
MONO%: 8.1 % (ref 0.0–14.0)
NEUT%: 70.3 % (ref 39.0–75.0)
NEUTROS ABS: 4.4 10*3/uL (ref 1.5–6.5)
Platelets: 218 10*3/uL (ref 140–400)
RBC: 4.34 10*6/uL (ref 4.20–5.82)
RDW: 14.3 % (ref 11.0–14.6)
WBC: 6.3 10*3/uL (ref 4.0–10.3)

## 2015-10-08 MED ORDER — HEPARIN SOD (PORK) LOCK FLUSH 100 UNIT/ML IV SOLN
500.0000 [IU] | Freq: Once | INTRAVENOUS | Status: AC | PRN
  Administered 2015-10-08: 500 [IU] via INTRAVENOUS
  Filled 2015-10-08: qty 5

## 2015-10-08 MED ORDER — SODIUM CHLORIDE 0.9 % IJ SOLN
10.0000 mL | INTRAMUSCULAR | Status: DC | PRN
  Administered 2015-10-08: 10 mL via INTRAVENOUS
  Filled 2015-10-08: qty 10

## 2015-10-08 NOTE — Patient Instructions (Signed)

## 2015-10-09 ENCOUNTER — Encounter: Payer: Self-pay | Admitting: Oncology

## 2015-10-09 ENCOUNTER — Ambulatory Visit (HOSPITAL_BASED_OUTPATIENT_CLINIC_OR_DEPARTMENT_OTHER): Payer: Medicare Other | Admitting: Oncology

## 2015-10-09 ENCOUNTER — Telehealth: Payer: Self-pay

## 2015-10-09 ENCOUNTER — Telehealth: Payer: Self-pay | Admitting: Oncology

## 2015-10-09 VITALS — BP 150/84 | HR 79 | Temp 97.9°F | Resp 18 | Wt 203.8 lb

## 2015-10-09 DIAGNOSIS — E876 Hypokalemia: Secondary | ICD-10-CM

## 2015-10-09 DIAGNOSIS — C61 Malignant neoplasm of prostate: Secondary | ICD-10-CM | POA: Diagnosis not present

## 2015-10-09 DIAGNOSIS — R599 Enlarged lymph nodes, unspecified: Secondary | ICD-10-CM | POA: Diagnosis not present

## 2015-10-09 DIAGNOSIS — C7951 Secondary malignant neoplasm of bone: Secondary | ICD-10-CM | POA: Diagnosis not present

## 2015-10-09 DIAGNOSIS — I10 Essential (primary) hypertension: Secondary | ICD-10-CM

## 2015-10-09 DIAGNOSIS — C419 Malignant neoplasm of bone and articular cartilage, unspecified: Secondary | ICD-10-CM

## 2015-10-09 LAB — PSA: Prostate Specific Ag, Serum: 4.9 ng/mL — ABNORMAL HIGH (ref 0.0–4.0)

## 2015-10-09 MED FILL — ZYTIGA 250 MG TABLET: 250 | 30 days supply | Qty: 120 | Fill #0

## 2015-10-09 NOTE — Progress Notes (Signed)
Hematology and Oncology Follow Up Visit  Philip [name] [id number] August 19, 1945 70 y.o. 10/09/2015 12:02 PM No PCP Per PatientNo ref. provider found   Principle Diagnosis: 70 year old gentleman with advanced prostate cancer diagnosed in October 2016. He presented with a PSA of 2900 and widespread bone metastasis as well as pelvic adenopathy.    Prior Therapy: Status post orchiectomy done on November 2016. Status post port placement on 02/09/2015. Taxotere chemotherapy at 75 mg/m to start on 02/16/2015. He is status post 6 cycles of therapy last cycle received in March 2017.  Current therapy: Zytiga 1000 mg daily with prednisone at 5 mg daily started on 09/13/2015.  Interim History:  Philip [name] presents today for a follow-up visit. Since the last visit, he started Zytiga with prednisone and have tolerated it well. He reports no complications such as nausea, abdominal pain or fatigue. He denied any lower extremity edema. His quality of life have improved dramatically and no longer reporting any pain at this time. His groin pain have completely resolved. He still have lower extremity edema on the left side compared to the right but has been chronic in nature.   His quality of life continues to improve his appetite is excellent and his performance status continues to improve. He feels the best he has felt in the last year. He was evaluated by his dentist and dental surgeon and has been cleared and not plan to have any dental procedures in the future.  He does not report any headaches, blurry vision, syncope or seizures. He does not report any fevers, chills, sweats or weight loss. He does not report any chest pain, palpitation, orthopnea. Does not report any cough, wheezing, hemoptysis or dyspnea on exertion. He does not report any nausea, vomiting, abdominal pain, hematochezia. He does not report any frequency, urgency or hesitancy. Does not report any hematuria or dysuria. He does not report any  arthralgias, myalgias. Does not report any lymphadenopathy or petechiae. Does not report any change in his mood including depression or anxiety. Remaining review of systems unremarkable  Medications: I have reviewed the patient's current medications.   Current Outpatient Prescriptions  Medication Sig Dispense Refill  . BIOTIN 5000 PO Take 1 tablet by mouth daily.    . Calcium Carbonate-Vitamin D3 (CALCIUM 600+D3) 600-400 MG-UNIT TABS Take 1 tablet by mouth daily.    . fexofenadine (ALLEGRA) 180 MG tablet Take 180 mg by mouth daily.    . fluticasone (FLONASE) 50 MCG/ACT nasal spray Place 1 spray into both nostrils daily as needed for allergies or rhinitis.    . Glycerin-Polysorbate 80 (REFRESH DRY EYE THERAPY OP) Apply 1 drop to eye daily.    . halobetasol (ULTRAVATE) 0.05 % ointment Apply 1 application topically as needed. For exzema.    . hydrOXYzine (ATARAX/VISTARIL) 10 MG tablet Take 10 mg by mouth every 8 (eight) hours.  4  . lidocaine-prilocaine (EMLA) cream Apply 1 application topically as needed. Apply to portacath before each chemotherapy. 30 g 0  . Multiple Vitamin (MULTIVITAMIN WITH MINERALS) TABS tablet Take 1 tablet by mouth daily.    . ondansetron (ZOFRAN) 4 MG tablet Take 1 tablet (4 mg total) by mouth every 8 (eight) hours as needed for nausea or vomiting. 20 tablet 0  . predniSONE (DELTASONE) 5 MG tablet Take 1 tablet (5 mg total) by mouth daily with breakfast. 60 tablet 0  . terbinafine (LAMISIL) 1 % cream Apply 1 application topically 2 (two) times daily.    Marland Kitchen tretinoin (RETIN-A) 0.1 %  cream Apply 1 application topically at bedtime.    Marland Kitchen ZYTIGA 250 MG tablet TAKE 4 TABLETS BY MOUTH DAILY. TAKE ON AN EMPTY STOMACH 1 HOUR BEFORE OR 2 HOURS AFTER A MEAL 120 tablet 0   No current facility-administered medications for this visit.      Allergies: No Known Allergies  Past Medical History, Surgical history, Social history, and Family History were reviewed and  updated.   Physical Exam: Blood pressure (!) 150/84, pulse 79, temperature 97.9 F (36.6 C), temperature source Oral, resp. rate 18, weight 203 lb 12.8 oz (92.4 kg), SpO2 100 %. ECOG: 0 General appearance: Alert, awake gentleman without distress. Head: Normocephalic, without obvious abnormality No oral ulcers or lesions. Neck: no adenopathy Lymph nodes: Cervical, supraclavicular, and axillary nodes normal. Heart:regular rate and rhythm, S1, S2 normal, no murmur, click, rub or gallop Lung:chest clear, no wheezing, rales, normal symmetric air entry Abdomin: soft, non-tender, without masses or organomegaly no rebound or guarding. EXT:no erythema, induration, or nodules    Results for Philip, [name] (MRN [id number]) as of 10/09/2015 11:39  Ref. Range 07/17/2015 08:15 08/28/2015 08:18 10/08/2015 09:16  PSA Latest Ref Range: 0.0 - 4.0 ng/mL 7.0 (H) 5.4 (H) 4.9 (H)     Lab Results: Lab Results  Component Value Date   WBC 6.3 10/08/2015   HGB 12.9 (L) 10/08/2015   HCT 39.1 10/08/2015   MCV 90.0 10/08/2015   PLT 218 10/08/2015     Chemistry      Component Value Date/Time   NA 138 10/08/2015 0916   K 4.1 10/08/2015 0916   CL 104 02/09/2015 1305   CO2 24 10/08/2015 0916   BUN 13.4 10/08/2015 0916   CREATININE 0.8 10/08/2015 0916      Component Value Date/Time   CALCIUM 9.6 10/08/2015 0916   ALKPHOS 121 10/08/2015 0916   AST 18 10/08/2015 0916   ALT 16 10/08/2015 0916   BILITOT 0.51 10/08/2015 0916          Impression and Plan:  70 year old gentleman with the following issues:  1. Advanced prostate cancer diagnosed in October 2016. He presented with PSA of 2900 and unknown Gleason score. His disease includes widespread bony metastasis as well as pelvic adenopathy. He is status post bilateral orchiectomy done in November 2016.  He is S/P Taxotere at 75 mg/m for a total of 6 cycles that is well-tolerated.  He is currently on Zytiga which was started after the completion of  systemic chemotherapy and have tolerated it well since 09/13/2015. His PSA continues to decline down to 4.9 and clinically has improved. The plan is to continue the same dose and schedule without any modifications.  2. IV access: Port-A-Cath  in place without complications. This will be flushed every visit and blood will be drawn from his Port-A-Cath for subsequent visits.  3. Hypertension: Used to be monitored on a regular basis on Zytiga. His blood pressure have been within normal range and ambulatory setting.  4. Hormone therapy: He is status post bilateral orchiectomy.   5. Bone directed therapy: We discussed the role of Xgeva in this particular setting. Given the extensive nature of his bone disease he would benefit from this medication. Risks and benefits were reviewed today and complications that include hypocalcemia, arthralgias and myalgias and osteonecrosis of the jaw were reviewed. He has received dental clearance and agreeable to proceed on a monthly basis. He will receive his next injection next week or so. He is already on calcium and vitamin D  supplements.  6. Hypokalemia. His potassium is within normal range and we'll continue to monitor on subsequent visits.  7. Follow-up: He will also be in 4 to 5 weeks to monitor his clinical status.    [facility], MD 8/8/201712:02 PM

## 2015-10-09 NOTE — Telephone Encounter (Signed)
Gave patient avs report and appointments for August and September  °

## 2015-10-15 ENCOUNTER — Ambulatory Visit (HOSPITAL_BASED_OUTPATIENT_CLINIC_OR_DEPARTMENT_OTHER): Payer: Medicare Other

## 2015-10-15 VITALS — BP 150/77 | HR 80 | Temp 97.5°F

## 2015-10-15 DIAGNOSIS — C7951 Secondary malignant neoplasm of bone: Secondary | ICD-10-CM | POA: Diagnosis present

## 2015-10-15 DIAGNOSIS — Z95828 Presence of other vascular implants and grafts: Secondary | ICD-10-CM

## 2015-10-15 DIAGNOSIS — C61 Malignant neoplasm of prostate: Secondary | ICD-10-CM

## 2015-10-15 MED ORDER — DENOSUMAB 120 MG/1.7ML ~~LOC~~ SOLN
120.0000 mg | Freq: Once | SUBCUTANEOUS | Status: AC
  Administered 2015-10-15: 120 mg via SUBCUTANEOUS
  Filled 2015-10-15: qty 1.7

## 2015-10-15 NOTE — Patient Instructions (Signed)
Denosumab injection  What is this medicine?  DENOSUMAB (den oh sue mab) slows bone breakdown. Prolia is used to treat osteoporosis in women after menopause and in men. Xgeva is used to prevent bone fractures and other bone problems caused by cancer bone metastases. Xgeva is also used to treat giant cell tumor of the bone.  This medicine may be used for other purposes; ask your health care provider or pharmacist if you have questions.  What should I tell my health care provider before I take this medicine?  They need to know if you have any of these conditions:  -dental disease  -eczema  -infection or history of infections  -kidney disease or on dialysis  -low blood calcium or vitamin D  -malabsorption syndrome  -scheduled to have surgery or tooth extraction  -taking medicine that contains denosumab  -thyroid or parathyroid disease  -an unusual reaction to denosumab, other medicines, foods, dyes, or preservatives  -pregnant or trying to get pregnant  -breast-feeding  How should I use this medicine?  This medicine is for injection under the skin. It is given by a health care professional in a hospital or clinic setting.  If you are getting Prolia, a special MedGuide will be given to you by the pharmacist with each prescription and refill. Be sure to read this information carefully each time.  For Prolia, talk to your pediatrician regarding the use of this medicine in children. Special care may be needed. For Xgeva, talk to your pediatrician regarding the use of this medicine in children. While this drug may be prescribed for children as young as 13 years for selected conditions, precautions do apply.  Overdosage: If you think you have taken too much of this medicine contact a poison control center or emergency room at once.  NOTE: This medicine is only for you. Do not share this medicine with others.  What if I miss a dose?  It is important not to miss your dose. Call your doctor or health care professional if you are  unable to keep an appointment.  What may interact with this medicine?  Do not take this medicine with any of the following medications:  -other medicines containing denosumab  This medicine may also interact with the following medications:  -medicines that suppress the immune system  -medicines that treat cancer  -steroid medicines like prednisone or cortisone  This list may not describe all possible interactions. Give your health care provider a list of all the medicines, herbs, non-prescription drugs, or dietary supplements you use. Also tell them if you smoke, drink alcohol, or use illegal drugs. Some items may interact with your medicine.  What should I watch for while using this medicine?  Visit your doctor or health care professional for regular checks on your progress. Your doctor or health care professional may order blood tests and other tests to see how you are doing.  Call your doctor or health care professional if you get a cold or other infection while receiving this medicine. Do not treat yourself. This medicine may decrease your body's ability to fight infection.  You should make sure you get enough calcium and vitamin D while you are taking this medicine, unless your doctor tells you not to. Discuss the foods you eat and the vitamins you take with your health care professional.  See your dentist regularly. Brush and floss your teeth as directed. Before you have any dental work done, tell your dentist you are receiving this medicine.  Do   not become pregnant while taking this medicine or for 5 months after stopping it. Women should inform their doctor if they wish to become pregnant or think they might be pregnant. There is a potential for serious side effects to an unborn child. Talk to your health care professional or pharmacist for more information.  What side effects may I notice from receiving this medicine?  Side effects that you should report to your doctor or health care professional as soon as  possible:  -allergic reactions like skin rash, itching or hives, swelling of the face, lips, or tongue  -breathing problems  -chest pain  -fast, irregular heartbeat  -feeling faint or lightheaded, falls  -fever, chills, or any other sign of infection  -muscle spasms, tightening, or twitches  -numbness or tingling  -skin blisters or bumps, or is dry, peels, or red  -slow healing or unexplained pain in the mouth or jaw  -unusual bleeding or bruising  Side effects that usually do not require medical attention (Report these to your doctor or health care professional if they continue or are bothersome.):  -muscle pain  -stomach upset, gas  This list may not describe all possible side effects. Call your doctor for medical advice about side effects. You may report side effects to FDA at 1-800-FDA-1088.  Where should I keep my medicine?  This medicine is only given in a clinic, doctor's office, or other health care setting and will not be stored at home.  NOTE: This sheet is a summary. It may not cover all possible information. If you have questions about this medicine, talk to your doctor, pharmacist, or health care provider.      2016, [employer] Standard. (2011-08-18 12:37:47)

## 2015-10-16 DIAGNOSIS — C419 Malignant neoplasm of bone and articular cartilage, unspecified: Secondary | ICD-10-CM | POA: Insufficient documentation

## 2015-10-22 DIAGNOSIS — C61 Malignant neoplasm of prostate: Secondary | ICD-10-CM | POA: Diagnosis not present

## 2015-10-22 DIAGNOSIS — C775 Secondary and unspecified malignant neoplasm of intrapelvic lymph nodes: Secondary | ICD-10-CM | POA: Diagnosis not present

## 2015-10-22 DIAGNOSIS — N1330 Unspecified hydronephrosis: Secondary | ICD-10-CM | POA: Diagnosis not present

## 2015-10-22 DIAGNOSIS — I87302 Chronic venous hypertension (idiopathic) without complications of left lower extremity: Secondary | ICD-10-CM | POA: Diagnosis not present

## 2015-11-06 ENCOUNTER — Other Ambulatory Visit: Payer: Self-pay | Admitting: Oncology

## 2015-11-06 DIAGNOSIS — C61 Malignant neoplasm of prostate: Secondary | ICD-10-CM

## 2015-11-12 DIAGNOSIS — L821 Other seborrheic keratosis: Secondary | ICD-10-CM | POA: Diagnosis not present

## 2015-11-12 DIAGNOSIS — L814 Other melanin hyperpigmentation: Secondary | ICD-10-CM | POA: Diagnosis not present

## 2015-11-12 DIAGNOSIS — D1801 Hemangioma of skin and subcutaneous tissue: Secondary | ICD-10-CM | POA: Diagnosis not present

## 2015-11-13 ENCOUNTER — Ambulatory Visit (HOSPITAL_BASED_OUTPATIENT_CLINIC_OR_DEPARTMENT_OTHER): Payer: Medicare Other

## 2015-11-13 ENCOUNTER — Other Ambulatory Visit (HOSPITAL_BASED_OUTPATIENT_CLINIC_OR_DEPARTMENT_OTHER): Payer: Medicare Other

## 2015-11-13 DIAGNOSIS — C61 Malignant neoplasm of prostate: Secondary | ICD-10-CM

## 2015-11-13 DIAGNOSIS — Z95828 Presence of other vascular implants and grafts: Secondary | ICD-10-CM

## 2015-11-13 LAB — COMPREHENSIVE METABOLIC PANEL
ALBUMIN: 4 g/dL (ref 3.5–5.0)
ALK PHOS: 107 U/L (ref 40–150)
ALT: 40 U/L (ref 0–55)
AST: 27 U/L (ref 5–34)
Anion Gap: 8 mEq/L (ref 3–11)
BILIRUBIN TOTAL: 0.65 mg/dL (ref 0.20–1.20)
BUN: 13.4 mg/dL (ref 7.0–26.0)
CALCIUM: 9.8 mg/dL (ref 8.4–10.4)
CO2: 23 mEq/L (ref 22–29)
Chloride: 104 mEq/L (ref 98–109)
Creatinine: 0.8 mg/dL (ref 0.7–1.3)
EGFR: 89 mL/min/{1.73_m2} — AB (ref >90)
GLUCOSE: 96 mg/dL (ref 70–140)
POTASSIUM: 4.3 meq/L (ref 3.5–5.1)
SODIUM: 135 meq/L — AB (ref 136–145)
TOTAL PROTEIN: 7.3 g/dL (ref 6.4–8.3)

## 2015-11-13 LAB — CBC WITH DIFFERENTIAL/PLATELET
BASO%: 0.4 % (ref 0.0–2.0)
BASOS ABS: 0 10*3/uL (ref 0.0–0.1)
EOS ABS: 0.1 10*3/uL (ref 0.0–0.5)
EOS%: 1.2 % (ref 0.0–7.0)
HEMATOCRIT: 41 % (ref 38.4–49.9)
HEMOGLOBIN: 13.5 g/dL (ref 13.0–17.1)
LYMPH#: 1.2 10*3/uL (ref 0.9–3.3)
LYMPH%: 18.9 % (ref 14.0–49.0)
MCH: 30 pg (ref 27.2–33.4)
MCHC: 32.9 g/dL (ref 32.0–36.0)
MCV: 91.1 fL (ref 79.3–98.0)
MONO#: 0.3 10*3/uL (ref 0.1–0.9)
MONO%: 5.6 % (ref 0.0–14.0)
NEUT%: 73.9 % (ref 39.0–75.0)
NEUTROS ABS: 4.5 10*3/uL (ref 1.5–6.5)
Platelets: 266 10*3/uL (ref 140–400)
RBC: 4.5 10*6/uL (ref 4.20–5.82)
RDW: 14.9 % — AB (ref 11.0–14.6)
WBC: 6.1 10*3/uL (ref 4.0–10.3)

## 2015-11-13 MED ORDER — HEPARIN SOD (PORK) LOCK FLUSH 100 UNIT/ML IV SOLN
500.0000 [IU] | Freq: Once | INTRAVENOUS | Status: AC | PRN
  Administered 2015-11-13: 500 [IU] via INTRAVENOUS
  Filled 2015-11-13: qty 5

## 2015-11-13 MED ORDER — SODIUM CHLORIDE 0.9 % IJ SOLN
10.0000 mL | INTRAMUSCULAR | Status: DC | PRN
  Administered 2015-11-13: 10 mL via INTRAVENOUS
  Filled 2015-11-13: qty 10

## 2015-11-14 ENCOUNTER — Ambulatory Visit: Payer: Medicare Other

## 2015-11-14 ENCOUNTER — Ambulatory Visit (HOSPITAL_BASED_OUTPATIENT_CLINIC_OR_DEPARTMENT_OTHER): Payer: Medicare Other | Admitting: Oncology

## 2015-11-14 ENCOUNTER — Telehealth: Payer: Self-pay | Admitting: Oncology

## 2015-11-14 ENCOUNTER — Encounter (HOSPITAL_BASED_OUTPATIENT_CLINIC_OR_DEPARTMENT_OTHER): Payer: Medicare Other

## 2015-11-14 VITALS — BP 139/85 | HR 73 | Temp 98.4°F | Resp 18 | Wt 202.7 lb

## 2015-11-14 DIAGNOSIS — C61 Malignant neoplasm of prostate: Secondary | ICD-10-CM

## 2015-11-14 DIAGNOSIS — C7951 Secondary malignant neoplasm of bone: Secondary | ICD-10-CM

## 2015-11-14 DIAGNOSIS — I10 Essential (primary) hypertension: Secondary | ICD-10-CM | POA: Diagnosis not present

## 2015-11-14 DIAGNOSIS — E876 Hypokalemia: Secondary | ICD-10-CM

## 2015-11-14 LAB — PSA: PROSTATE SPECIFIC AG, SERUM: 4.6 ng/mL — AB (ref 0.0–4.0)

## 2015-11-14 MED ORDER — DENOSUMAB 120 MG/1.7ML ~~LOC~~ SOLN
120.0000 mg | Freq: Once | SUBCUTANEOUS | Status: AC
  Administered 2015-11-14: 120 mg via SUBCUTANEOUS
  Filled 2015-11-14: qty 1.7

## 2015-11-14 NOTE — Patient Instructions (Signed)
Denosumab injection  What is this medicine?  DENOSUMAB (den oh sue mab) slows bone breakdown. Prolia is used to treat osteoporosis in women after menopause and in men. Xgeva is used to prevent bone fractures and other bone problems caused by cancer bone metastases. Xgeva is also used to treat giant cell tumor of the bone.  This medicine may be used for other purposes; ask your health care provider or pharmacist if you have questions.  What should I tell my health care provider before I take this medicine?  They need to know if you have any of these conditions:  -dental disease  -eczema  -infection or history of infections  -kidney disease or on dialysis  -low blood calcium or vitamin D  -malabsorption syndrome  -scheduled to have surgery or tooth extraction  -taking medicine that contains denosumab  -thyroid or parathyroid disease  -an unusual reaction to denosumab, other medicines, foods, dyes, or preservatives  -pregnant or trying to get pregnant  -breast-feeding  How should I use this medicine?  This medicine is for injection under the skin. It is given by a health care professional in a hospital or clinic setting.  If you are getting Prolia, a special MedGuide will be given to you by the pharmacist with each prescription and refill. Be sure to read this information carefully each time.  For Prolia, talk to your pediatrician regarding the use of this medicine in children. Special care may be needed. For Xgeva, talk to your pediatrician regarding the use of this medicine in children. While this drug may be prescribed for children as young as 13 years for selected conditions, precautions do apply.  Overdosage: If you think you have taken too much of this medicine contact a poison control center or emergency room at once.  NOTE: This medicine is only for you. Do not share this medicine with others.  What if I miss a dose?  It is important not to miss your dose. Call your doctor or health care professional if you are  unable to keep an appointment.  What may interact with this medicine?  Do not take this medicine with any of the following medications:  -other medicines containing denosumab  This medicine may also interact with the following medications:  -medicines that suppress the immune system  -medicines that treat cancer  -steroid medicines like prednisone or cortisone  This list may not describe all possible interactions. Give your health care provider a list of all the medicines, herbs, non-prescription drugs, or dietary supplements you use. Also tell them if you smoke, drink alcohol, or use illegal drugs. Some items may interact with your medicine.  What should I watch for while using this medicine?  Visit your doctor or health care professional for regular checks on your progress. Your doctor or health care professional may order blood tests and other tests to see how you are doing.  Call your doctor or health care professional if you get a cold or other infection while receiving this medicine. Do not treat yourself. This medicine may decrease your body's ability to fight infection.  You should make sure you get enough calcium and vitamin D while you are taking this medicine, unless your doctor tells you not to. Discuss the foods you eat and the vitamins you take with your health care professional.  See your dentist regularly. Brush and floss your teeth as directed. Before you have any dental work done, tell your dentist you are receiving this medicine.  Do   not become pregnant while taking this medicine or for 5 months after stopping it. Women should inform their doctor if they wish to become pregnant or think they might be pregnant. There is a potential for serious side effects to an unborn child. Talk to your health care professional or pharmacist for more information.  What side effects may I notice from receiving this medicine?  Side effects that you should report to your doctor or health care professional as soon as  possible:  -allergic reactions like skin rash, itching or hives, swelling of the face, lips, or tongue  -breathing problems  -chest pain  -fast, irregular heartbeat  -feeling faint or lightheaded, falls  -fever, chills, or any other sign of infection  -muscle spasms, tightening, or twitches  -numbness or tingling  -skin blisters or bumps, or is dry, peels, or red  -slow healing or unexplained pain in the mouth or jaw  -unusual bleeding or bruising  Side effects that usually do not require medical attention (Report these to your doctor or health care professional if they continue or are bothersome.):  -muscle pain  -stomach upset, gas  This list may not describe all possible side effects. Call your doctor for medical advice about side effects. You may report side effects to FDA at 1-800-FDA-1088.  Where should I keep my medicine?  This medicine is only given in a clinic, doctor's office, or other health care setting and will not be stored at home.  NOTE: This sheet is a summary. It may not cover all possible information. If you have questions about this medicine, talk to your doctor, pharmacist, or health care provider.      2016, [employer] Standard. (2011-08-18 12:37:47)

## 2015-11-14 NOTE — Telephone Encounter (Signed)
Avs report and schedule given per 11/14/15 los. °

## 2015-11-14 NOTE — Progress Notes (Signed)
Hematology and Oncology Follow Up Visit  Philip [name] [id number] 11-19-1945 70 y.o. 11/14/2015 10:00 AM No PCP Per PatientNo ref. provider found   Principle Diagnosis: Philip [name] with advanced prostate cancer diagnosed in October 2016. He presented with a PSA of 2900 and widespread bone metastasis as well as pelvic adenopathy.    Prior Therapy: Status post orchiectomy done on November 2016. Status post port placement on 02/09/2015. Taxotere chemotherapy at 75 mg/m to start on 02/16/2015. He is status post 6 cycles of therapy last cycle received in March 2017.  Current therapy: Zytiga 1000 mg daily with prednisone at 5 mg daily started on 09/13/2015.  Interim History:  Philip [name] presents today for a follow-up visit. Since the last visit, he reports no major changes in his health. He continues to have excellent quality of life and performance status. He continues to work full time and attends to activities of daily living.   He continues on Zytiga with prednisone and have tolerated it well. He reports no complications such as nausea, abdominal pain or fatigue. He denied any lower extremity edema. He reports his bone pain and pelvic pain have improved dramatically since the start of Zytiga.  He denied any complications related to [facility] and willing to continue. He denied any dental complications or any dental work in the near future.  He does not report any headaches, blurry vision, syncope or seizures. He does not report any fevers, chills, sweats or weight loss. He does not report any chest pain, palpitation, orthopnea. Does not report any cough, wheezing, hemoptysis or dyspnea on exertion. He does not report any nausea, vomiting, abdominal pain, hematochezia. He does not report any frequency, urgency or hesitancy. Does not report any hematuria or dysuria. He does not report any arthralgias, myalgias. Does not report any lymphadenopathy or petechiae. Does not report any change in his  mood including depression or anxiety. Remaining review of systems unremarkable  Medications: I have reviewed the patient's current medications.   Current Outpatient Prescriptions  Medication Sig Dispense Refill  . BIOTIN 5000 PO Take 1 tablet by mouth daily.    . Calcium Carbonate-Vitamin D3 (CALCIUM 600+D3) 600-400 MG-UNIT TABS Take 1 tablet by mouth daily.    . Denosumab (XGEVA [place]) Inject into the skin.    . fexofenadine (ALLEGRA) 180 MG tablet Take 180 mg by mouth daily.    . fluticasone (FLONASE) 50 MCG/ACT nasal spray Place 1 spray into both nostrils daily as needed for allergies or rhinitis.    . Glycerin-Polysorbate 80 (REFRESH DRY EYE THERAPY OP) Apply 1 drop to eye daily.    . halobetasol (ULTRAVATE) 0.05 % ointment Apply 1 application topically as needed. For exzema.    . hydrOXYzine (ATARAX/VISTARIL) 10 MG tablet Take 10 mg by mouth every 8 (eight) hours.  4  . lidocaine-prilocaine (EMLA) cream Apply 1 application topically as needed. Apply to portacath before each chemotherapy. 30 g 0  . Multiple Vitamin (MULTIVITAMIN WITH MINERALS) TABS tablet Take 1 tablet by mouth daily.    . ondansetron (ZOFRAN) 4 MG tablet Take 1 tablet (4 mg total) by mouth every 8 (eight) hours as needed for nausea or vomiting. 20 tablet 0  . predniSONE (DELTASONE) 5 MG tablet TAKE 1 TABLET BY MOUTH DAILY WITH BREAKFAST 60 tablet 0  . terbinafine (LAMISIL) 1 % cream Apply 1 application topically 2 (two) times daily.    Marland Kitchen tretinoin (RETIN-A) 0.1 % cream Apply 1 application topically at bedtime.    Marland Kitchen ZYTIGA 250  MG tablet TAKE 4 TABLETS BY MOUTH DAILY. TAKE ON AN EMPTY STOMACH 1 HOUR BEFORE OR 2 HOURS AFTER A MEAL 120 tablet 0   No current facility-administered medications for this visit.      Allergies: No Known Allergies  Past Medical History, Surgical history, Social history, and Family History were reviewed and updated.   Physical Exam: Blood pressure 139/85, pulse 73, temperature 98.4 F (36.9 C),  temperature source Oral, resp. rate 18, weight 202 lb 11.2 oz (91.9 kg), SpO2 100 %. ECOG: 0 General appearance: Well-appearing [name] without distress. Head: Normocephalic, without obvious abnormality No oral rash noted. Neck: no adenopathy Lymph nodes: Cervical, supraclavicular, and axillary nodes normal. Heart:regular rate and rhythm, S1, S2 normal, no murmur, click, rub or gallop Lung:chest clear, no wheezing, rales, normal symmetric air entry Abdomin: soft, non-tender, without masses or organomegaly no shifting dullness or ascites. EXT:no erythema, induration, or nodules. Very little to no edema noted in his left lower extremity.    Results for Philip [name], Philip [name] (MRN [id number]) as of 11/14/2015 09:51  Ref. Range 08/28/2015 08:18 10/08/2015 09:16 11/13/2015 11:58  PSA Latest Ref Range: 0.0 - 4.0 ng/mL 5.4 (H) 4.9 (H) 4.6 (H)      Lab Results: Lab Results  Component Value Date   WBC 6.1 11/13/2015   HGB 13.5 11/13/2015   HCT 41.0 11/13/2015   MCV 91.1 11/13/2015   PLT 266 11/13/2015     Chemistry      Component Value Date/Time   NA 135 (L) 11/13/2015 1158   K 4.3 11/13/2015 1158   CL 104 02/09/2015 1305   CO2 23 11/13/2015 1158   BUN 13.4 11/13/2015 1158   CREATININE 0.8 11/13/2015 1158      Component Value Date/Time   CALCIUM 9.8 11/13/2015 1158   ALKPHOS 107 11/13/2015 1158   AST 27 11/13/2015 1158   ALT 40 11/13/2015 1158   BILITOT 0.65 11/13/2015 1158          Impression and Plan:  Philip [name] with the following issues:  1. Advanced prostate cancer diagnosed in October 2016. He presented with PSA of 2900 and unknown Gleason score. His disease includes widespread bony metastasis as well as pelvic adenopathy. He is status post bilateral orchiectomy done in November 2016.  He is S/P Taxotere at 75 mg/m for a total of 6 cycles that is well-tolerated.  He is currently on Zytiga And continues to tolerated it well with prednisone. His PSA continues to  decline down to 4.6. His quality of life and overall performance status continues to improve. Plan is to continue with the same dose and schedule.  2. IV access: Port-A-Cath  in place without complications. This will be flushed every visit and blood will be drawn from his Port-A-Cath for subsequent visits.  3. Hypertension: His blood pressure appears to be within normal range on Zytiga.  4. Hormone therapy: He is status post bilateral orchiectomy.   5. Bone directed therapy: Currently on Xgeva every 4 weeks. Risks and benefits were reviewed again including hypocalcemia and osteonecrosis of the jaw. He is willing to continue.  6. Hypokalemia. His potassium is within normal range and we'll continue to monitor on subsequent visits.  7. Follow-up: He will also be in 4  weeks to monitor his clinical status.    SHADAD,FIRAS, MD 9/13/201710:00 AM

## 2015-11-28 ENCOUNTER — Other Ambulatory Visit: Payer: Self-pay | Admitting: *Deleted

## 2015-11-28 ENCOUNTER — Encounter: Payer: Self-pay | Admitting: *Deleted

## 2015-11-28 DIAGNOSIS — C61 Malignant neoplasm of prostate: Secondary | ICD-10-CM

## 2015-11-28 MED ORDER — ABIRATERONE ACETATE 250 MG PO TABS
1000.0000 mg | ORAL_TABLET | Freq: Every day | ORAL | 0 refills | Status: DC
Start: 2015-11-28 — End: 2015-12-19

## 2015-12-03 DIAGNOSIS — Z23 Encounter for immunization: Secondary | ICD-10-CM | POA: Diagnosis not present

## 2015-12-05 ENCOUNTER — Encounter: Payer: Self-pay | Admitting: *Deleted

## 2015-12-11 ENCOUNTER — Other Ambulatory Visit (HOSPITAL_BASED_OUTPATIENT_CLINIC_OR_DEPARTMENT_OTHER): Payer: Medicare Other

## 2015-12-11 ENCOUNTER — Ambulatory Visit (HOSPITAL_BASED_OUTPATIENT_CLINIC_OR_DEPARTMENT_OTHER): Payer: Medicare Other

## 2015-12-11 DIAGNOSIS — C61 Malignant neoplasm of prostate: Secondary | ICD-10-CM | POA: Diagnosis not present

## 2015-12-11 DIAGNOSIS — Z95828 Presence of other vascular implants and grafts: Secondary | ICD-10-CM

## 2015-12-11 LAB — CBC WITH DIFFERENTIAL/PLATELET
BASO%: 0.5 % (ref 0.0–2.0)
Basophils Absolute: 0 10*3/uL (ref 0.0–0.1)
EOS%: 2.1 % (ref 0.0–7.0)
Eosinophils Absolute: 0.1 10*3/uL (ref 0.0–0.5)
HCT: 38.2 % — ABNORMAL LOW (ref 38.4–49.9)
HGB: 12.7 g/dL — ABNORMAL LOW (ref 13.0–17.1)
LYMPH%: 24.9 % (ref 14.0–49.0)
MCH: 30.5 pg (ref 27.2–33.4)
MCHC: 33.2 g/dL (ref 32.0–36.0)
MCV: 91.7 fL (ref 79.3–98.0)
MONO#: 0.5 10*3/uL (ref 0.1–0.9)
MONO%: 9.2 % (ref 0.0–14.0)
NEUT#: 3.5 10*3/uL (ref 1.5–6.5)
NEUT%: 63.3 % (ref 39.0–75.0)
Platelets: 220 10*3/uL (ref 140–400)
RBC: 4.17 10*6/uL — ABNORMAL LOW (ref 4.20–5.82)
RDW: 14.3 % (ref 11.0–14.6)
WBC: 5.6 10*3/uL (ref 4.0–10.3)
lymph#: 1.4 10*3/uL (ref 0.9–3.3)

## 2015-12-11 LAB — COMPREHENSIVE METABOLIC PANEL
ALBUMIN: 3.7 g/dL (ref 3.5–5.0)
ALK PHOS: 66 U/L (ref 40–150)
ALT: 16 U/L (ref 0–55)
AST: 18 U/L (ref 5–34)
Anion Gap: 10 mEq/L (ref 3–11)
BUN: 12.2 mg/dL (ref 7.0–26.0)
CHLORIDE: 104 meq/L (ref 98–109)
CO2: 24 mEq/L (ref 22–29)
Calcium: 9.3 mg/dL (ref 8.4–10.4)
Creatinine: 0.8 mg/dL (ref 0.7–1.3)
EGFR: 88 mL/min/{1.73_m2} — AB (ref >90)
GLUCOSE: 100 mg/dL (ref 70–140)
POTASSIUM: 4.1 meq/L (ref 3.5–5.1)
SODIUM: 138 meq/L (ref 136–145)
Total Bilirubin: 0.54 mg/dL (ref 0.20–1.20)
Total Protein: 6.8 g/dL (ref 6.4–8.3)

## 2015-12-11 MED ORDER — HEPARIN SOD (PORK) LOCK FLUSH 100 UNIT/ML IV SOLN
500.0000 [IU] | Freq: Once | INTRAVENOUS | Status: AC | PRN
  Administered 2015-12-11: 500 [IU] via INTRAVENOUS
  Filled 2015-12-11: qty 5

## 2015-12-11 MED ORDER — SODIUM CHLORIDE 0.9 % IJ SOLN
10.0000 mL | INTRAMUSCULAR | Status: DC | PRN
  Administered 2015-12-11: 10 mL via INTRAVENOUS
  Filled 2015-12-11: qty 10

## 2015-12-12 ENCOUNTER — Ambulatory Visit (HOSPITAL_BASED_OUTPATIENT_CLINIC_OR_DEPARTMENT_OTHER): Payer: Medicare Other

## 2015-12-12 ENCOUNTER — Ambulatory Visit (HOSPITAL_BASED_OUTPATIENT_CLINIC_OR_DEPARTMENT_OTHER): Payer: Medicare Other | Admitting: Oncology

## 2015-12-12 ENCOUNTER — Telehealth: Payer: Self-pay | Admitting: Oncology

## 2015-12-12 VITALS — BP 138/88 | HR 75 | Temp 98.0°F | Resp 18 | Wt 200.8 lb

## 2015-12-12 DIAGNOSIS — I10 Essential (primary) hypertension: Secondary | ICD-10-CM

## 2015-12-12 DIAGNOSIS — C7951 Secondary malignant neoplasm of bone: Secondary | ICD-10-CM

## 2015-12-12 DIAGNOSIS — E876 Hypokalemia: Secondary | ICD-10-CM | POA: Diagnosis not present

## 2015-12-12 DIAGNOSIS — C61 Malignant neoplasm of prostate: Secondary | ICD-10-CM

## 2015-12-12 LAB — PSA: Prostate Specific Ag, Serum: 3.6 ng/mL (ref 0.0–4.0)

## 2015-12-12 MED ORDER — DENOSUMAB 120 MG/1.7ML ~~LOC~~ SOLN
120.0000 mg | Freq: Once | SUBCUTANEOUS | Status: AC
Start: 2015-12-12 — End: 2015-12-12
  Administered 2015-12-12: 120 mg via SUBCUTANEOUS
  Filled 2015-12-12: qty 1.7

## 2015-12-12 NOTE — Patient Instructions (Signed)
Denosumab injection  What is this medicine?  DENOSUMAB (den oh sue mab) slows bone breakdown. Prolia is used to treat osteoporosis in women after menopause and in men. Xgeva is used to prevent bone fractures and other bone problems caused by cancer bone metastases. Xgeva is also used to treat giant cell tumor of the bone.  This medicine may be used for other purposes; ask your health care provider or pharmacist if you have questions.  What should I tell my health care provider before I take this medicine?  They need to know if you have any of these conditions:  -dental disease  -eczema  -infection or history of infections  -kidney disease or on dialysis  -low blood calcium or vitamin D  -malabsorption syndrome  -scheduled to have surgery or tooth extraction  -taking medicine that contains denosumab  -thyroid or parathyroid disease  -an unusual reaction to denosumab, other medicines, foods, dyes, or preservatives  -pregnant or trying to get pregnant  -breast-feeding  How should I use this medicine?  This medicine is for injection under the skin. It is given by a health care professional in a hospital or clinic setting.  If you are getting Prolia, a special MedGuide will be given to you by the pharmacist with each prescription and refill. Be sure to read this information carefully each time.  For Prolia, talk to your pediatrician regarding the use of this medicine in children. Special care may be needed. For Xgeva, talk to your pediatrician regarding the use of this medicine in children. While this drug may be prescribed for children as young as 13 years for selected conditions, precautions do apply.  Overdosage: If you think you have taken too much of this medicine contact a poison control center or emergency room at once.  NOTE: This medicine is only for you. Do not share this medicine with others.  What if I miss a dose?  It is important not to miss your dose. Call your doctor or health care professional if you are  unable to keep an appointment.  What may interact with this medicine?  Do not take this medicine with any of the following medications:  -other medicines containing denosumab  This medicine may also interact with the following medications:  -medicines that suppress the immune system  -medicines that treat cancer  -steroid medicines like prednisone or cortisone  This list may not describe all possible interactions. Give your health care provider a list of all the medicines, herbs, non-prescription drugs, or dietary supplements you use. Also tell them if you smoke, drink alcohol, or use illegal drugs. Some items may interact with your medicine.  What should I watch for while using this medicine?  Visit your doctor or health care professional for regular checks on your progress. Your doctor or health care professional may order blood tests and other tests to see how you are doing.  Call your doctor or health care professional if you get a cold or other infection while receiving this medicine. Do not treat yourself. This medicine may decrease your body's ability to fight infection.  You should make sure you get enough calcium and vitamin D while you are taking this medicine, unless your doctor tells you not to. Discuss the foods you eat and the vitamins you take with your health care professional.  See your dentist regularly. Brush and floss your teeth as directed. Before you have any dental work done, tell your dentist you are receiving this medicine.  Do   not become pregnant while taking this medicine or for 5 months after stopping it. Women should inform their doctor if they wish to become pregnant or think they might be pregnant. There is a potential for serious side effects to an unborn child. Talk to your health care professional or pharmacist for more information.  What side effects may I notice from receiving this medicine?  Side effects that you should report to your doctor or health care professional as soon as  possible:  -allergic reactions like skin rash, itching or hives, swelling of the face, lips, or tongue  -breathing problems  -chest pain  -fast, irregular heartbeat  -feeling faint or lightheaded, falls  -fever, chills, or any other sign of infection  -muscle spasms, tightening, or twitches  -numbness or tingling  -skin blisters or bumps, or is dry, peels, or red  -slow healing or unexplained pain in the mouth or jaw  -unusual bleeding or bruising  Side effects that usually do not require medical attention (Report these to your doctor or health care professional if they continue or are bothersome.):  -muscle pain  -stomach upset, gas  This list may not describe all possible side effects. Call your doctor for medical advice about side effects. You may report side effects to FDA at 1-800-FDA-1088.  Where should I keep my medicine?  This medicine is only given in a clinic, doctor's office, or other health care setting and will not be stored at home.  NOTE: This sheet is a summary. It may not cover all possible information. If you have questions about this medicine, talk to your doctor, pharmacist, or health care provider.      2016, [employer] Standard. (2011-08-18 12:37:47)

## 2015-12-12 NOTE — Progress Notes (Signed)
Hematology and Oncology Follow Up Visit  Philip [name] [id number] 12/09/1945 70 y.o. 12/12/2015 3:19 PM No PCP Per PatientNo ref. provider found   Principle Diagnosis: 70 year old gentleman with advanced prostate cancer diagnosed in October 2016. He presented with a PSA of 2900 and widespread bone metastasis as well as pelvic adenopathy.    Prior Therapy: Status post orchiectomy done on November 2016. Status post port placement on 02/09/2015. Taxotere chemotherapy at 75 mg/m to start on 02/16/2015. He is status post 6 cycles of therapy last cycle received in March 2017.  Current therapy: Zytiga 1000 mg daily with prednisone at 5 mg daily started on 09/13/2015.  Interim History:  Philip [name] presents today for a follow-up visit. Since the last visit, he continues to do well without recent complaints. He continues on Zytiga with prednisone and have tolerated it well. He reports no complications such as nausea, abdominal pain or fatigue. He denied any lower extremity edema. He remains active and attends to activities of daily living. He continues to work as well without any decline. He denied any arthralgias or myalgias or any other bone pain. He denied any complications related to [facility].  He does not report any headaches, blurry vision, syncope or seizures. He does not report any fevers, chills, sweats or weight loss. He does not report any chest pain, palpitation, orthopnea. Does not report any cough, wheezing, hemoptysis or dyspnea on exertion. He does not report any nausea, vomiting, abdominal pain, hematochezia. He does not report any frequency, urgency or hesitancy. Does not report any hematuria or dysuria. Does not report any lymphadenopathy or petechiae. Remaining review of systems unremarkable  Medications: I have reviewed the patient's current medications.   Current Outpatient Prescriptions  Medication Sig Dispense Refill  . abiraterone Acetate (ZYTIGA) 250 MG tablet Take 4 tablets (1,000  mg total) by mouth daily. Take on an empty stomach 1 hour before or 2 hours after a meal 120 tablet 0  . BIOTIN 5000 PO Take 1 tablet by mouth daily.    . Calcium Carbonate-Vitamin D3 (CALCIUM 600+D3) 600-400 MG-UNIT TABS Take 1 tablet by mouth daily.    . Denosumab (XGEVA [place]) Inject into the skin.    . fexofenadine (ALLEGRA) 180 MG tablet Take 180 mg by mouth daily.    . fluticasone (FLONASE) 50 MCG/ACT nasal spray Place 1 spray into both nostrils daily as needed for allergies or rhinitis.    Marland Kitchen FLUZONE HIGH-DOSE 0.5 ML SUSY TO BE ADMINISTERED BY PHARMACIST FOR IMMUNIZATION  0  . Glycerin-Polysorbate 80 (REFRESH DRY EYE THERAPY OP) Apply 1 drop to eye daily.    . halobetasol (ULTRAVATE) 0.05 % ointment Apply 1 application topically as needed. For exzema.    . hydrOXYzine (ATARAX/VISTARIL) 10 MG tablet Take 10 mg by mouth every 8 (eight) hours.  4  . lidocaine-prilocaine (EMLA) cream Apply 1 application topically as needed. Apply to portacath before each chemotherapy. 30 g 0  . Multiple Vitamin (MULTIVITAMIN WITH MINERALS) TABS tablet Take 1 tablet by mouth daily.    . ondansetron (ZOFRAN) 4 MG tablet Take 1 tablet (4 mg total) by mouth every 8 (eight) hours as needed for nausea or vomiting. 20 tablet 0  . predniSONE (DELTASONE) 5 MG tablet TAKE 1 TABLET BY MOUTH DAILY WITH BREAKFAST 60 tablet 0  . terbinafine (LAMISIL) 1 % cream Apply 1 application topically 2 (two) times daily.    Marland Kitchen tretinoin (RETIN-A) 0.1 % cream Apply 1 application topically at bedtime.     No  current facility-administered medications for this visit.      Allergies: No Known Allergies  Past Medical History, Surgical history, Social history, and Family History were reviewed and updated.   Physical Exam: Blood pressure 138/88, pulse 75, temperature 98 F (36.7 C), temperature source Oral, resp. rate 18, weight 200 lb 12.8 oz (91.1 kg), SpO2 100 %. ECOG: 0 General appearance: Alert, awake gentleman without  distress. Head: Normocephalic, without obvious abnormality No oral ulcers or lesions. Neck: no adenopathy Lymph nodes: Cervical, supraclavicular, and axillary nodes normal. Heart:regular rate and rhythm, S1, S2 normal, no murmur, click, rub or gallop Lung:chest clear, no wheezing, rales, normal symmetric air entry Abdomin: soft, non-tender, without masses or organomegaly no rebound or guarding. EXT: He continues to have mild edema on the left more than the right.   Results for Philip, [name] (MRN [id number]) as of 12/12/2015 15:01  Ref. Range 10/08/2015 09:16 11/13/2015 11:58 12/11/2015 08:38  PSA Latest Ref Range: 0.0 - 4.0 ng/mL 4.9 (H) 4.6 (H) 3.6       Lab Results: Lab Results  Component Value Date   WBC 5.6 12/11/2015   HGB 12.7 (L) 12/11/2015   HCT 38.2 (L) 12/11/2015   MCV 91.7 12/11/2015   PLT 220 12/11/2015     Chemistry      Component Value Date/Time   NA 138 12/11/2015 0838   K 4.1 12/11/2015 0838   CL 104 02/09/2015 1305   CO2 24 12/11/2015 0838   BUN 12.2 12/11/2015 0838   CREATININE 0.8 12/11/2015 0838      Component Value Date/Time   CALCIUM 9.3 12/11/2015 0838   ALKPHOS 66 12/11/2015 0838   AST 18 12/11/2015 0838   ALT 16 12/11/2015 0838   BILITOT 0.54 12/11/2015 0838          Impression and Plan:  70 year old gentleman with the following issues:  1. Advanced prostate cancer diagnosed in October 2016. He presented with PSA of 2900 and unknown Gleason score. His disease includes widespread bony metastasis as well as pelvic adenopathy. He is status post bilateral orchiectomy done in November 2016.  He is S/P Taxotere at 75 mg/m for a total of 6 cycles that is well-tolerated.  He is currently on Zytiga and continues to tolerated it well with prednisone. His PSA has decreased further currently at 3.6 without any new complications related to medication. The plan is to continue the same dose and schedule without any dose reduction or delay.  2. IV  access: Port-A-Cath  in place without complications. This will be flushed every visit and labs will be obtained during these flushes.  3. Hypertension: His blood pressure appears to be within normal range on Zytiga.  4. Hormone therapy: He is status post bilateral orchiectomy.   5. Bone directed therapy: Currently on Xgeva every 4 weeks. Risks and benefits were reviewed again including hypocalcemia and osteonecrosis of the jaw and he is willing to continue without any objections.  6. Hypokalemia. His potassium is within normal range and we'll continue to monitor on subsequent visits.  7. Follow-up: He will also be in 4  weeks to monitor his clinical status.    [id number], MD 10/11/20173:19 PM

## 2015-12-12 NOTE — Telephone Encounter (Signed)
GAVE PATIENT AVS REPORT AND APPOINTMENTS FOR November.  °

## 2015-12-14 ENCOUNTER — Encounter: Payer: Self-pay | Admitting: *Deleted

## 2015-12-19 ENCOUNTER — Other Ambulatory Visit: Payer: Self-pay | Admitting: *Deleted

## 2015-12-19 DIAGNOSIS — C61 Malignant neoplasm of prostate: Secondary | ICD-10-CM

## 2015-12-19 MED ORDER — ABIRATERONE ACETATE 250 MG PO TABS: 1000.0000 mg | ORAL_TABLET | Freq: Every day | ORAL | 0 refills | Status: DC

## 2015-12-28 ENCOUNTER — Telehealth: Payer: Self-pay | Admitting: *Deleted

## 2015-12-28 NOTE — Telephone Encounter (Signed)
Patient called and stated,"I've had a decreased appetite for about a week now, and I'm wondering if it is coming from the [place]? I don't get hungry, even though I'm on Prednisone, I have to make myself eat." Please let Dr. Alen Blew know. Instructed patient I would talk with Dr. Alen Blew and call him back. Return number is [phone].

## 2015-12-31 NOTE — Telephone Encounter (Signed)
Patient called and stated,"things have turned around and my appetite is back. Please let Dr. Alen Blew know."

## 2016-01-02 ENCOUNTER — Other Ambulatory Visit: Payer: Self-pay | Admitting: Oncology

## 2016-01-07 ENCOUNTER — Other Ambulatory Visit (HOSPITAL_BASED_OUTPATIENT_CLINIC_OR_DEPARTMENT_OTHER): Payer: Medicare Other

## 2016-01-07 ENCOUNTER — Ambulatory Visit (HOSPITAL_BASED_OUTPATIENT_CLINIC_OR_DEPARTMENT_OTHER): Payer: Medicare Other

## 2016-01-07 DIAGNOSIS — Z95828 Presence of other vascular implants and grafts: Secondary | ICD-10-CM

## 2016-01-07 DIAGNOSIS — C61 Malignant neoplasm of prostate: Secondary | ICD-10-CM

## 2016-01-07 LAB — CBC WITH DIFFERENTIAL/PLATELET
BASO%: 0.4 % (ref 0.0–2.0)
Basophils Absolute: 0 10*3/uL (ref 0.0–0.1)
EOS ABS: 0.1 10*3/uL (ref 0.0–0.5)
EOS%: 1 % (ref 0.0–7.0)
HCT: 38.6 % (ref 38.4–49.9)
HEMOGLOBIN: 12.8 g/dL — AB (ref 13.0–17.1)
LYMPH%: 23.8 % (ref 14.0–49.0)
MCH: 30.2 pg (ref 27.2–33.4)
MCHC: 33 g/dL (ref 32.0–36.0)
MCV: 91.4 fL (ref 79.3–98.0)
MONO#: 0.4 10*3/uL (ref 0.1–0.9)
MONO%: 6.1 % (ref 0.0–14.0)
NEUT%: 68.7 % (ref 39.0–75.0)
NEUTROS ABS: 4.3 10*3/uL (ref 1.5–6.5)
PLATELETS: 241 10*3/uL (ref 140–400)
RBC: 4.23 10*6/uL (ref 4.20–5.82)
RDW: 13.9 % (ref 11.0–14.6)
WBC: 6.3 10*3/uL (ref 4.0–10.3)
lymph#: 1.5 10*3/uL (ref 0.9–3.3)

## 2016-01-07 LAB — COMPREHENSIVE METABOLIC PANEL
ALBUMIN: 3.8 g/dL (ref 3.5–5.0)
ALK PHOS: 62 U/L (ref 40–150)
ALT: 14 U/L (ref 0–55)
ANION GAP: 7 meq/L (ref 3–11)
AST: 18 U/L (ref 5–34)
BILIRUBIN TOTAL: 0.4 mg/dL (ref 0.20–1.20)
BUN: 13.2 mg/dL (ref 7.0–26.0)
CALCIUM: 9.4 mg/dL (ref 8.4–10.4)
CO2: 25 mEq/L (ref 22–29)
Chloride: 103 mEq/L (ref 98–109)
Creatinine: 0.8 mg/dL (ref 0.7–1.3)
Glucose: 113 mg/dl (ref 70–140)
Potassium: 4.2 mEq/L (ref 3.5–5.1)
Sodium: 135 mEq/L — ABNORMAL LOW (ref 136–145)
TOTAL PROTEIN: 6.8 g/dL (ref 6.4–8.3)

## 2016-01-07 MED ORDER — SODIUM CHLORIDE 0.9 % IJ SOLN
10.0000 mL | INTRAMUSCULAR | Status: DC | PRN
  Administered 2016-01-07: 10 mL via INTRAVENOUS
  Filled 2016-01-07: qty 10

## 2016-01-08 ENCOUNTER — Telehealth: Payer: Self-pay | Admitting: Oncology

## 2016-01-08 ENCOUNTER — Ambulatory Visit (HOSPITAL_BASED_OUTPATIENT_CLINIC_OR_DEPARTMENT_OTHER): Payer: Medicare Other | Admitting: Oncology

## 2016-01-08 ENCOUNTER — Ambulatory Visit (HOSPITAL_BASED_OUTPATIENT_CLINIC_OR_DEPARTMENT_OTHER): Payer: Medicare Other

## 2016-01-08 VITALS — BP 142/85 | HR 79 | Temp 98.1°F | Resp 17 | Ht 76.0 in | Wt 196.5 lb

## 2016-01-08 DIAGNOSIS — C61 Malignant neoplasm of prostate: Secondary | ICD-10-CM

## 2016-01-08 DIAGNOSIS — R590 Localized enlarged lymph nodes: Secondary | ICD-10-CM

## 2016-01-08 DIAGNOSIS — C7951 Secondary malignant neoplasm of bone: Secondary | ICD-10-CM

## 2016-01-08 DIAGNOSIS — I10 Essential (primary) hypertension: Secondary | ICD-10-CM | POA: Diagnosis not present

## 2016-01-08 DIAGNOSIS — Z95828 Presence of other vascular implants and grafts: Secondary | ICD-10-CM

## 2016-01-08 DIAGNOSIS — E876 Hypokalemia: Secondary | ICD-10-CM

## 2016-01-08 LAB — PSA: PROSTATE SPECIFIC AG, SERUM: 3.9 ng/mL (ref 0.0–4.0)

## 2016-01-08 MED ORDER — ALTEPLASE 2 MG IJ SOLR
2.0000 mg | Freq: Once | INTRAMUSCULAR | Status: DC | PRN
  Filled 2016-01-08: qty 2

## 2016-01-08 MED ORDER — DENOSUMAB 120 MG/1.7ML ~~LOC~~ SOLN
120.0000 mg | Freq: Once | SUBCUTANEOUS | Status: AC
  Administered 2016-01-08: 120 mg via SUBCUTANEOUS
  Filled 2016-01-08: qty 1.7

## 2016-01-08 NOTE — Patient Instructions (Signed)
Denosumab injection  What is this medicine?  DENOSUMAB (den oh sue mab) slows bone breakdown. Prolia is used to treat osteoporosis in women after menopause and in men. Xgeva is used to prevent bone fractures and other bone problems caused by cancer bone metastases. Xgeva is also used to treat giant cell tumor of the bone.  This medicine may be used for other purposes; ask your health care provider or pharmacist if you have questions.  What should I tell my health care provider before I take this medicine?  They need to know if you have any of these conditions:  -dental disease  -eczema  -infection or history of infections  -kidney disease or on dialysis  -low blood calcium or vitamin D  -malabsorption syndrome  -scheduled to have surgery or tooth extraction  -taking medicine that contains denosumab  -thyroid or parathyroid disease  -an unusual reaction to denosumab, other medicines, foods, dyes, or preservatives  -pregnant or trying to get pregnant  -breast-feeding  How should I use this medicine?  This medicine is for injection under the skin. It is given by a health care professional in a hospital or clinic setting.  If you are getting Prolia, a special MedGuide will be given to you by the pharmacist with each prescription and refill. Be sure to read this information carefully each time.  For Prolia, talk to your pediatrician regarding the use of this medicine in children. Special care may be needed. For Xgeva, talk to your pediatrician regarding the use of this medicine in children. While this drug may be prescribed for children as young as 13 years for selected conditions, precautions do apply.  Overdosage: If you think you have taken too much of this medicine contact a poison control center or emergency room at once.  NOTE: This medicine is only for you. Do not share this medicine with others.  What if I miss a dose?  It is important not to miss your dose. Call your doctor or health care professional if you are  unable to keep an appointment.  What may interact with this medicine?  Do not take this medicine with any of the following medications:  -other medicines containing denosumab  This medicine may also interact with the following medications:  -medicines that suppress the immune system  -medicines that treat cancer  -steroid medicines like prednisone or cortisone  This list may not describe all possible interactions. Give your health care provider a list of all the medicines, herbs, non-prescription drugs, or dietary supplements you use. Also tell them if you smoke, drink alcohol, or use illegal drugs. Some items may interact with your medicine.  What should I watch for while using this medicine?  Visit your doctor or health care professional for regular checks on your progress. Your doctor or health care professional may order blood tests and other tests to see how you are doing.  Call your doctor or health care professional if you get a cold or other infection while receiving this medicine. Do not treat yourself. This medicine may decrease your body's ability to fight infection.  You should make sure you get enough calcium and vitamin D while you are taking this medicine, unless your doctor tells you not to. Discuss the foods you eat and the vitamins you take with your health care professional.  See your dentist regularly. Brush and floss your teeth as directed. Before you have any dental work done, tell your dentist you are receiving this medicine.  Do   not become pregnant while taking this medicine or for 5 months after stopping it. Women should inform their doctor if they wish to become pregnant or think they might be pregnant. There is a potential for serious side effects to an unborn child. Talk to your health care professional or pharmacist for more information.  What side effects may I notice from receiving this medicine?  Side effects that you should report to your doctor or health care professional as soon as  possible:  -allergic reactions like skin rash, itching or hives, swelling of the face, lips, or tongue  -breathing problems  -chest pain  -fast, irregular heartbeat  -feeling faint or lightheaded, falls  -fever, chills, or any other sign of infection  -muscle spasms, tightening, or twitches  -numbness or tingling  -skin blisters or bumps, or is dry, peels, or red  -slow healing or unexplained pain in the mouth or jaw  -unusual bleeding or bruising  Side effects that usually do not require medical attention (Report these to your doctor or health care professional if they continue or are bothersome.):  -muscle pain  -stomach upset, gas  This list may not describe all possible side effects. Call your doctor for medical advice about side effects. You may report side effects to FDA at 1-800-FDA-1088.  Where should I keep my medicine?  This medicine is only given in a clinic, doctor's office, or other health care setting and will not be stored at home.  NOTE: This sheet is a summary. It may not cover all possible information. If you have questions about this medicine, talk to your doctor, pharmacist, or health care provider.      2016, [employer] Standard. (2011-08-18 12:37:47)

## 2016-01-08 NOTE — Progress Notes (Signed)
Hematology and Oncology Follow Up Visit  Philip [name] [id number] 09/25/1945 70 y.o. 01/08/2016 3:22 PM No PCP Per PatientNo ref. provider found   Principle Diagnosis: 70 year old gentleman with advanced prostate cancer diagnosed in October 2016. He presented with a PSA of 2900 and widespread bone metastasis as well as pelvic adenopathy.    Prior Therapy: Status post orchiectomy done on November 2016. Status post port placement on 02/09/2015. Taxotere chemotherapy at 75 mg/m to start on 02/16/2015. He is status post 6 cycles of therapy last cycle received in March 2017.  Current therapy: Zytiga 1000 mg daily with prednisone at 5 mg daily started on 09/13/2015.  Interim History:  Philip [name] presents today for a follow-up visit. Since the last visit, he reports no major changes in his health. He did have one episode of poor by mouth intake and lack of appetite and did drop a few pounds. His issues have resolved at this time and his appetite is back to normal. He continues on Zytiga with prednisone and have tolerated it well. He reports no complications such as nausea, abdominal pain or fatigue. He denied any lower extremity edema.   He remains active and attends to activities of daily living. He continues to exercise periodically including walking and stretching. He continues to work as well without any decline. He denied any arthralgias or myalgias or any other bone pain. He denied any complications related to [facility].  He does not report any headaches, blurry vision, syncope or seizures. He does not report any fevers, chills, sweats or weight loss. He does not report any chest pain, palpitation, orthopnea. Does not report any cough, wheezing, hemoptysis or dyspnea on exertion. He does not report any nausea, vomiting, abdominal pain, hematochezia. He does not report any frequency, urgency or hesitancy. Does not report any hematuria or dysuria. Does not report any lymphadenopathy or petechiae. Remaining  review of systems unremarkable  Medications: I have reviewed the patient's current medications.   Current Outpatient Prescriptions  Medication Sig Dispense Refill  . abiraterone Acetate (ZYTIGA) 250 MG tablet Take 4 tablets (1,000 mg total) by mouth daily. Take on an empty stomach 1 hour before or 2 hours after a meal 120 tablet 0  . BIOTIN 5000 PO Take 1 tablet by mouth daily.    . Calcium Carbonate-Vitamin D3 (CALCIUM 600+D3) 600-400 MG-UNIT TABS Take 1 tablet by mouth daily.    . Denosumab (XGEVA [place]) Inject into the skin.    . fexofenadine (ALLEGRA) 180 MG tablet Take 180 mg by mouth daily.    . fluticasone (FLONASE) 50 MCG/ACT nasal spray Place 1 spray into both nostrils daily as needed for allergies or rhinitis.    Marland Kitchen FLUZONE HIGH-DOSE 0.5 ML SUSY TO BE ADMINISTERED BY PHARMACIST FOR IMMUNIZATION  0  . Glycerin-Polysorbate 80 (REFRESH DRY EYE THERAPY OP) Apply 1 drop to eye daily.    . halobetasol (ULTRAVATE) 0.05 % ointment Apply 1 application topically as needed. For exzema.    . hydrOXYzine (ATARAX/VISTARIL) 10 MG tablet Take 10 mg by mouth every 8 (eight) hours.  4  . lidocaine-prilocaine (EMLA) cream Apply 1 application topically as needed. Apply to portacath before each chemotherapy. 30 g 0  . Multiple Vitamin (MULTIVITAMIN WITH MINERALS) TABS tablet Take 1 tablet by mouth daily.    . ondansetron (ZOFRAN) 4 MG tablet Take 1 tablet (4 mg total) by mouth every 8 (eight) hours as needed for nausea or vomiting. 20 tablet 0  . predniSONE (DELTASONE) 5 MG tablet TAKE  1 TABLET BY MOUTH DAILY WITH BREAKFAST 60 tablet 0  . terbinafine (LAMISIL) 1 % cream Apply 1 application topically 2 (two) times daily.    Marland Kitchen tretinoin (RETIN-A) 0.1 % cream Apply 1 application topically at bedtime.     No current facility-administered medications for this visit.    Facility-Administered Medications Ordered in Other Visits  Medication Dose Route Frequency Provider Last Rate Last Dose  . alteplase (CATHFLO  ACTIVASE) injection 2 mg  2 mg Intracatheter Once PRN [name], MD      . denosumab (XGEVA) injection 120 mg  120 mg Subcutaneous Once [name], MD         Allergies: No Known Allergies  Past Medical History, Surgical history, Social history, and Family History were reviewed and updated.   Physical Exam: Blood pressure (!) 142/85, pulse 79, temperature 98.1 F (36.7 C), temperature source Oral, resp. rate 17, height 6\' 4"  (1.93 m), weight 196 lb 8 oz (89.1 kg), SpO2 100 %. ECOG: 0 General appearance: Well-appearing gentleman without distress. Head: Normocephalic, without obvious abnormality No oral or oropharyngeal lesions. Neck: no adenopathy Lymph nodes: Cervical, supraclavicular, and axillary nodes normal. Heart:regular rate and rhythm, S1, S2 normal, no murmur, click, rub or gallop Lung:chest clear, no wheezing, rales, normal symmetric air entry Abdomin: soft, non-tender, without masses or organomegaly no rebound or guarding. EXT: He continues to have mild edema on the left more than the right. Unchanged from previous examination.   Results for Philip [name] (MRN [id number]) as of 01/08/2016 15:00  Ref. Range 11/13/2015 11:58 12/11/2015 08:38 01/07/2016 13:35  PSA Latest Ref Range: 0.0 - 4.0 ng/mL 4.6 (H) 3.6 3.9        Lab Results: Lab Results  Component Value Date   WBC 6.3 01/07/2016   HGB 12.8 (L) 01/07/2016   HCT 38.6 01/07/2016   MCV 91.4 01/07/2016   PLT 241 01/07/2016     Chemistry      Component Value Date/Time   NA 135 (L) 01/07/2016 1335   K 4.2 01/07/2016 1335   CL 104 02/09/2015 1305   CO2 25 01/07/2016 1335   BUN 13.2 01/07/2016 1335   CREATININE 0.8 01/07/2016 1335      Component Value Date/Time   CALCIUM 9.4 01/07/2016 1335   ALKPHOS 62 01/07/2016 1335   AST 18 01/07/2016 1335   ALT 14 01/07/2016 1335   BILITOT 0.40 01/07/2016 1335          Impression and Plan:  70 year old gentleman with the following issues:  1.  Advanced prostate cancer diagnosed in October 2016. He presented with PSA of 2900 and unknown Gleason score. His disease includes widespread bony metastasis as well as pelvic adenopathy. He is status post bilateral orchiectomy done in November 2016.  He is S/P Taxotere at 75 mg/m for a total of 6 cycles that is well-tolerated.  He is currently on Zytiga and continues to tolerated it well with prednisone.   His PSA remains reasonably low and not dramatically changed. It was 3.9 in November 2017 and I have recommended continuing Zytiga with the same dose and schedule.  2. IV access: Port-A-Cath  in place without complications. This will be flushed every visit and labs will be obtained during these flushes.  3. Hypertension: His blood pressure appears to be within normal range on Zytiga.  4. Hormone therapy: He is status post bilateral orchiectomy.   5. Bone directed therapy: Currently on Xgeva every 4 weeks. Risks and benefits were reviewed  again including hypocalcemia and osteonecrosis of the jaw and he has no objections to continue. He will receive Xgeva today and every visit.  6. Hypokalemia. His potassium is within normal range and we'll continue to monitor on subsequent visits.  7. Follow-up: He will also be in 4  weeks to monitor his clinical status.    [facility], MD 11/7/20173:22 PM

## 2016-01-08 NOTE — Telephone Encounter (Signed)
All appointments scheduled per 01/08/16 los. AVS report and appointment schedule given to patient, per 01/08/16 los.

## 2016-01-17 ENCOUNTER — Other Ambulatory Visit: Payer: Self-pay | Admitting: *Deleted

## 2016-01-17 DIAGNOSIS — C61 Malignant neoplasm of prostate: Secondary | ICD-10-CM

## 2016-01-17 MED ORDER — ABIRATERONE ACETATE 250 MG PO TABS
1000.0000 mg | ORAL_TABLET | Freq: Every day | ORAL | 0 refills | Status: DC
Start: 2016-01-17 — End: 2016-02-20

## 2016-02-06 ENCOUNTER — Other Ambulatory Visit (HOSPITAL_BASED_OUTPATIENT_CLINIC_OR_DEPARTMENT_OTHER): Payer: Medicare Other

## 2016-02-06 ENCOUNTER — Ambulatory Visit (HOSPITAL_BASED_OUTPATIENT_CLINIC_OR_DEPARTMENT_OTHER): Payer: Medicare Other

## 2016-02-06 DIAGNOSIS — Z95828 Presence of other vascular implants and grafts: Secondary | ICD-10-CM

## 2016-02-06 DIAGNOSIS — C61 Malignant neoplasm of prostate: Secondary | ICD-10-CM

## 2016-02-06 LAB — COMPREHENSIVE METABOLIC PANEL
ALK PHOS: 81 U/L (ref 40–150)
ALT: 26 U/L (ref 0–55)
ANION GAP: 9 meq/L (ref 3–11)
AST: 31 U/L (ref 5–34)
Albumin: 2.7 g/dL — ABNORMAL LOW (ref 3.5–5.0)
BILIRUBIN TOTAL: 0.61 mg/dL (ref 0.20–1.20)
BUN: 7.4 mg/dL (ref 7.0–26.0)
CO2: 23 meq/L (ref 22–29)
Calcium: 8.8 mg/dL (ref 8.4–10.4)
Chloride: 99 mEq/L (ref 98–109)
Creatinine: 0.7 mg/dL (ref 0.7–1.3)
Glucose: 116 mg/dl (ref 70–140)
Potassium: 3.9 mEq/L (ref 3.5–5.1)
Sodium: 130 mEq/L — ABNORMAL LOW (ref 136–145)
TOTAL PROTEIN: 6.4 g/dL (ref 6.4–8.3)

## 2016-02-06 LAB — CBC WITH DIFFERENTIAL/PLATELET
BASO%: 0.1 % (ref 0.0–2.0)
Basophils Absolute: 0 10*3/uL (ref 0.0–0.1)
EOS%: 0 % (ref 0.0–7.0)
Eosinophils Absolute: 0 10*3/uL (ref 0.0–0.5)
HEMATOCRIT: 30.4 % — AB (ref 38.4–49.9)
HEMOGLOBIN: 10.2 g/dL — AB (ref 13.0–17.1)
LYMPH#: 1 10*3/uL (ref 0.9–3.3)
LYMPH%: 7.4 % — ABNORMAL LOW (ref 14.0–49.0)
MCH: 30.6 pg (ref 27.2–33.4)
MCHC: 33.7 g/dL (ref 32.0–36.0)
MCV: 90.9 fL (ref 79.3–98.0)
MONO#: 1.3 10*3/uL — AB (ref 0.1–0.9)
MONO%: 9.1 % (ref 0.0–14.0)
NEUT%: 83.4 % — AB (ref 39.0–75.0)
NEUTROS ABS: 11.8 10*3/uL — AB (ref 1.5–6.5)
PLATELETS: 257 10*3/uL (ref 140–400)
RBC: 3.35 10*6/uL — ABNORMAL LOW (ref 4.20–5.82)
RDW: 13.5 % (ref 11.0–14.6)
WBC: 14.1 10*3/uL — AB (ref 4.0–10.3)

## 2016-02-06 MED ORDER — SODIUM CHLORIDE 0.9 % IJ SOLN
10.0000 mL | INTRAMUSCULAR | Status: DC | PRN
  Administered 2016-02-06: 10 mL via INTRAVENOUS
  Filled 2016-02-06: qty 10

## 2016-02-06 MED ORDER — HEPARIN SOD (PORK) LOCK FLUSH 100 UNIT/ML IV SOLN
500.0000 [IU] | Freq: Once | INTRAVENOUS | Status: AC | PRN
  Administered 2016-02-06: 500 [IU] via INTRAVENOUS
  Filled 2016-02-06: qty 5

## 2016-02-07 LAB — PSA: Prostate Specific Ag, Serum: 5 ng/mL — ABNORMAL HIGH (ref 0.0–4.0)

## 2016-02-08 ENCOUNTER — Ambulatory Visit (HOSPITAL_BASED_OUTPATIENT_CLINIC_OR_DEPARTMENT_OTHER): Payer: Medicare Other | Admitting: Oncology

## 2016-02-08 ENCOUNTER — Telehealth: Payer: Self-pay | Admitting: Oncology

## 2016-02-08 ENCOUNTER — Encounter (HOSPITAL_BASED_OUTPATIENT_CLINIC_OR_DEPARTMENT_OTHER): Payer: Medicare Other

## 2016-02-08 ENCOUNTER — Ambulatory Visit: Payer: Medicare Other

## 2016-02-08 VITALS — BP 144/81 | HR 85 | Temp 97.9°F | Resp 18 | Ht 76.0 in | Wt 202.5 lb

## 2016-02-08 DIAGNOSIS — I10 Essential (primary) hypertension: Secondary | ICD-10-CM

## 2016-02-08 DIAGNOSIS — C7951 Secondary malignant neoplasm of bone: Secondary | ICD-10-CM

## 2016-02-08 DIAGNOSIS — C61 Malignant neoplasm of prostate: Secondary | ICD-10-CM

## 2016-02-08 DIAGNOSIS — E876 Hypokalemia: Secondary | ICD-10-CM | POA: Diagnosis not present

## 2016-02-08 DIAGNOSIS — Z95828 Presence of other vascular implants and grafts: Secondary | ICD-10-CM

## 2016-02-08 MED ORDER — DENOSUMAB 120 MG/1.7ML ~~LOC~~ SOLN
120.0000 mg | Freq: Once | SUBCUTANEOUS | Status: AC
  Administered 2016-02-08: 120 mg via SUBCUTANEOUS
  Filled 2016-02-08: qty 1.7

## 2016-02-08 NOTE — Telephone Encounter (Signed)
Gave patient avs report and appointments for January. [facility] radiology will call re scans.

## 2016-02-08 NOTE — Patient Instructions (Signed)
Denosumab injection  What is this medicine?  DENOSUMAB (den oh sue mab) slows bone breakdown. Prolia is used to treat osteoporosis in women after menopause and in men. Xgeva is used to prevent bone fractures and other bone problems caused by cancer bone metastases. Xgeva is also used to treat giant cell tumor of the bone.  This medicine may be used for other purposes; ask your health care provider or pharmacist if you have questions.  What should I tell my health care provider before I take this medicine?  They need to know if you have any of these conditions:  -dental disease  -eczema  -infection or history of infections  -kidney disease or on dialysis  -low blood calcium or vitamin D  -malabsorption syndrome  -scheduled to have surgery or tooth extraction  -taking medicine that contains denosumab  -thyroid or parathyroid disease  -an unusual reaction to denosumab, other medicines, foods, dyes, or preservatives  -pregnant or trying to get pregnant  -breast-feeding  How should I use this medicine?  This medicine is for injection under the skin. It is given by a health care professional in a hospital or clinic setting.  If you are getting Prolia, a special MedGuide will be given to you by the pharmacist with each prescription and refill. Be sure to read this information carefully each time.  For Prolia, talk to your pediatrician regarding the use of this medicine in children. Special care may be needed. For Xgeva, talk to your pediatrician regarding the use of this medicine in children. While this drug may be prescribed for children as young as 13 years for selected conditions, precautions do apply.  Overdosage: If you think you have taken too much of this medicine contact a poison control center or emergency room at once.  NOTE: This medicine is only for you. Do not share this medicine with others.  What if I miss a dose?  It is important not to miss your dose. Call your doctor or health care professional if you are  unable to keep an appointment.  What may interact with this medicine?  Do not take this medicine with any of the following medications:  -other medicines containing denosumab  This medicine may also interact with the following medications:  -medicines that suppress the immune system  -medicines that treat cancer  -steroid medicines like prednisone or cortisone  This list may not describe all possible interactions. Give your health care provider a list of all the medicines, herbs, non-prescription drugs, or dietary supplements you use. Also tell them if you smoke, drink alcohol, or use illegal drugs. Some items may interact with your medicine.  What should I watch for while using this medicine?  Visit your doctor or health care professional for regular checks on your progress. Your doctor or health care professional may order blood tests and other tests to see how you are doing.  Call your doctor or health care professional if you get a cold or other infection while receiving this medicine. Do not treat yourself. This medicine may decrease your body's ability to fight infection.  You should make sure you get enough calcium and vitamin D while you are taking this medicine, unless your doctor tells you not to. Discuss the foods you eat and the vitamins you take with your health care professional.  See your dentist regularly. Brush and floss your teeth as directed. Before you have any dental work done, tell your dentist you are receiving this medicine.  Do   not become pregnant while taking this medicine or for 5 months after stopping it. Women should inform their doctor if they wish to become pregnant or think they might be pregnant. There is a potential for serious side effects to an unborn child. Talk to your health care professional or pharmacist for more information.  What side effects may I notice from receiving this medicine?  Side effects that you should report to your doctor or health care professional as soon as  possible:  -allergic reactions like skin rash, itching or hives, swelling of the face, lips, or tongue  -breathing problems  -chest pain  -fast, irregular heartbeat  -feeling faint or lightheaded, falls  -fever, chills, or any other sign of infection  -muscle spasms, tightening, or twitches  -numbness or tingling  -skin blisters or bumps, or is dry, peels, or red  -slow healing or unexplained pain in the mouth or jaw  -unusual bleeding or bruising  Side effects that usually do not require medical attention (Report these to your doctor or health care professional if they continue or are bothersome.):  -muscle pain  -stomach upset, gas  This list may not describe all possible side effects. Call your doctor for medical advice about side effects. You may report side effects to FDA at 1-800-FDA-1088.  Where should I keep my medicine?  This medicine is only given in a clinic, doctor's office, or other health care setting and will not be stored at home.  NOTE: This sheet is a summary. It may not cover all possible information. If you have questions about this medicine, talk to your doctor, pharmacist, or health care provider.      2016, [employer] Standard. (2011-08-18 12:37:47)

## 2016-02-08 NOTE — Progress Notes (Signed)
Hematology and Oncology Follow Up Visit  Philip [name] [id number] [birth year] 70 y.o. 02/08/2016 3:38 PM No PCP Per PatientNo ref. provider found   Principle Diagnosis: 70 year old gentleman with advanced prostate cancer diagnosed in October 2016. He presented with a PSA of 2900 and widespread bone metastasis as well as pelvic adenopathy.    Prior Therapy: Status post orchiectomy done on November 2016. Status post port placement on 02/09/2015. Taxotere chemotherapy at 75 mg/m to start on 02/16/2015. He is status post 6 cycles of therapy last cycle received in March 2017.  Current therapy: Zytiga 1000 mg daily with prednisone at 5 mg daily started on 09/13/2015.  Interim History:  Philip [name] presents today for a follow-up visit. Since the last visit, he reports having a stressful week caring for his wife. She was hospitalized briefly for bronchitis and possibly pneumonia. She has been discharged from the hospital but she still recovering. He also did report symptoms of upper respiratory tract infection including cough and congestion. He denied any fevers, chills or weight loss. His appetite have been excellent and have gained weight.  He continues on Zytiga with prednisone and have tolerated it well. He reports no complications such as nausea, abdominal pain or fatigue. He denied any lower extremity edema. He remains active and attends to activities of daily living. He continues to exercise periodically including walking and stretching. He continues to work as well without any decline. He does not report any dental complications or any other issues related to [facility].  He does not report any headaches, blurry vision, syncope or seizures. He does not report any fevers, chills, sweats or weight loss. He does not report any chest pain, palpitation, orthopnea. Does not report any cough, wheezing, hemoptysis or dyspnea on exertion. He does not report any nausea, vomiting, abdominal pain, hematochezia. He does  not report any frequency, urgency or hesitancy. Does not report any hematuria or dysuria. Does not report any lymphadenopathy or petechiae. Remaining review of systems unremarkable  Medications: I have reviewed the patient's current medications.   Current Outpatient Prescriptions  Medication Sig Dispense Refill  . abiraterone Acetate (ZYTIGA) 250 MG tablet Take 4 tablets (1,000 mg total) by mouth daily. Take on an empty stomach 1 hour before or 2 hours after a meal 120 tablet 0  . BIOTIN 5000 PO Take 1 tablet by mouth daily.    . Calcium Carbonate-Vitamin D3 (CALCIUM 600+D3) 600-400 MG-UNIT TABS Take 1 tablet by mouth daily.    . Denosumab (XGEVA [place]) Inject into the skin.    . fexofenadine (ALLEGRA) 180 MG tablet Take 180 mg by mouth daily.    . fluticasone (FLONASE) 50 MCG/ACT nasal spray Place 1 spray into both nostrils daily as needed for allergies or rhinitis.    Marland Kitchen FLUZONE HIGH-DOSE 0.5 ML SUSY TO BE ADMINISTERED BY PHARMACIST FOR IMMUNIZATION  0  . Glycerin-Polysorbate 80 (REFRESH DRY EYE THERAPY OP) Apply 1 drop to eye daily.    . halobetasol (ULTRAVATE) 0.05 % ointment Apply 1 application topically as needed. For exzema.    . hydrOXYzine (ATARAX/VISTARIL) 10 MG tablet Take 10 mg by mouth every 8 (eight) hours.  4  . lidocaine-prilocaine (EMLA) cream Apply 1 application topically as needed. Apply to portacath before each chemotherapy. 30 g 0  . Multiple Vitamin (MULTIVITAMIN WITH MINERALS) TABS tablet Take 1 tablet by mouth daily.    . ondansetron (ZOFRAN) 4 MG tablet Take 1 tablet (4 mg total) by mouth every 8 (eight) hours as needed for  nausea or vomiting. 20 tablet 0  . predniSONE (DELTASONE) 5 MG tablet TAKE 1 TABLET BY MOUTH DAILY WITH BREAKFAST 60 tablet 0  . terbinafine (LAMISIL) 1 % cream Apply 1 application topically 2 (two) times daily.    Marland Kitchen tretinoin (RETIN-A) 0.1 % cream Apply 1 application topically at bedtime.     No current facility-administered medications for this visit.     Facility-Administered Medications Ordered in Other Visits  Medication Dose Route Frequency Provider Last Rate Last Dose  . alteplase (CATHFLO ACTIVASE) injection 2 mg  2 mg Intracatheter Once PRN [name], MD         Allergies: No Known Allergies  Past Medical History, Surgical history, Social history, and Family History were reviewed and updated.   Physical Exam: Blood pressure (!) 144/81, pulse 85, temperature 97.9 F (36.6 C), temperature source Oral, resp. rate 18, height 6\' 4"  (1.93 m), weight 202 lb 8 oz (91.9 kg), SpO2 100 %. ECOG: 0 General appearance: Alert, awake gentleman without distress. Head: Normocephalic, without obvious abnormality No oral thrush noted. Neck: no adenopathy Lymph nodes: Cervical, supraclavicular, and axillary nodes normal. Heart:regular rate and rhythm, S1, S2 normal, no murmur, click, rub or gallop Lung:chest clear, no wheezing, rales, normal symmetric air entry Abdomin: soft, non-tender, without masses or organomegaly no rebound or guarding. EXT: He continues to have mild edema on the left more than the right.      Results for Philip [name], Philip [name] (MRN [id number]) as of 02/08/2016 15:21  Ref. Range 01/07/2016 13:35 02/06/2016 11:55  PSA Latest Ref Range: 0.0 - 4.0 ng/mL 3.9 5.0 (H)        Lab Results: Lab Results  Component Value Date   WBC 14.1 (H) 02/06/2016   HGB 10.2 (L) 02/06/2016   HCT 30.4 (L) 02/06/2016   MCV 90.9 02/06/2016   PLT 257 02/06/2016     Chemistry      Component Value Date/Time   NA 130 (L) 02/06/2016 1155   K 3.9 02/06/2016 1155   CL 104 02/09/2015 1305   CO2 23 02/06/2016 1155   BUN 7.4 02/06/2016 1155   CREATININE 0.7 02/06/2016 1155      Component Value Date/Time   CALCIUM 8.8 02/06/2016 1155   ALKPHOS 81 02/06/2016 1155   AST 31 02/06/2016 1155   ALT 26 02/06/2016 1155   BILITOT 0.61 02/06/2016 1155          Impression and Plan:  70 year old gentleman with the following issues:  1.  Advanced prostate cancer diagnosed in October 2016. He presented with PSA of 2900 and unknown Gleason score. His disease includes widespread bony metastasis as well as pelvic adenopathy. He is status post bilateral orchiectomy done in November 2016.  He is S/P Taxotere at 75 mg/m for a total of 6 cycles that is well-tolerated.  He is currently on Zytiga and continues to tolerated it well with prednisone.   His PSA has been slowly rising in the last few months although very minimally at this time. He continues to feel well without any clinical signs of progression. I have recommended continuing Zytiga and prednisone for the time being and repeat staging workup in January 2017. Different salvage therapy will be utilized if he develops rapid progression of his disease or high rise in his PSA.  2. IV access: Port-A-Cath  in place without complications. This will be flushed every visit and labs will be obtained during these flushes.  3. Hypertension: His blood pressure appears to be within  normal range on Zytiga.  4. Hormone therapy: He is status post bilateral orchiectomy.   5. Bone directed therapy: Risks and benefits of continuing Xgeva were reviewed again including hypocalcemia and osteonecrosis of the jaw and he has no objections to continue. He will receive Xgeva today and every visit.  6. Hypokalemia. His potassium is within normal range and we'll continue to monitor on subsequent visits.  7. Follow-up: He will also be in 4  weeks to monitor his clinical status.    Zola Button, MD 12/8/20173:38 PM

## 2016-02-20 ENCOUNTER — Other Ambulatory Visit: Payer: Self-pay | Admitting: *Deleted

## 2016-02-20 DIAGNOSIS — C61 Malignant neoplasm of prostate: Secondary | ICD-10-CM

## 2016-02-20 MED ORDER — ABIRATERONE ACETATE 250 MG PO TABS: 1000.0000 mg | ORAL_TABLET | Freq: Every day | ORAL | 0 refills | Status: DC

## 2016-02-21 ENCOUNTER — Telehealth: Payer: Self-pay | Admitting: *Deleted

## 2016-02-21 NOTE — Telephone Encounter (Signed)
Patient wanted to know how to take zytiga and prednisone when he goes for a scan

## 2016-02-28 ENCOUNTER — Other Ambulatory Visit: Payer: Self-pay | Admitting: Oncology

## 2016-03-04 ENCOUNTER — Telehealth: Payer: Self-pay | Admitting: Oncology

## 2016-03-04 ENCOUNTER — Ambulatory Visit (HOSPITAL_BASED_OUTPATIENT_CLINIC_OR_DEPARTMENT_OTHER): Payer: Medicare Other

## 2016-03-04 ENCOUNTER — Encounter (HOSPITAL_COMMUNITY)
Admission: RE | Admit: 2016-03-04 | Discharge: 2016-03-04 | Disposition: A | Discharge status: Home / Self Care | Payer: Medicare Other | Source: Ambulatory Visit | Attending: Oncology | Admitting: Oncology

## 2016-03-04 ENCOUNTER — Encounter (HOSPITAL_COMMUNITY): Payer: Self-pay

## 2016-03-04 ENCOUNTER — Other Ambulatory Visit (HOSPITAL_BASED_OUTPATIENT_CLINIC_OR_DEPARTMENT_OTHER): Payer: Medicare Other

## 2016-03-04 ENCOUNTER — Ambulatory Visit (HOSPITAL_COMMUNITY)
Admission: RE | Admit: 2016-03-04 | Discharge: 2016-03-04 | Disposition: A | Discharge status: Home / Self Care | Payer: Medicare Other | Source: Ambulatory Visit | Attending: Oncology | Admitting: Oncology

## 2016-03-04 VITALS — BP 160/87 | HR 74 | Temp 98.2°F | Resp 18

## 2016-03-04 DIAGNOSIS — C61 Malignant neoplasm of prostate: Secondary | ICD-10-CM | POA: Diagnosis not present

## 2016-03-04 DIAGNOSIS — K7689 Other specified diseases of liver: Secondary | ICD-10-CM | POA: Diagnosis not present

## 2016-03-04 DIAGNOSIS — C7951 Secondary malignant neoplasm of bone: Secondary | ICD-10-CM | POA: Diagnosis not present

## 2016-03-04 DIAGNOSIS — N281 Cyst of kidney, acquired: Secondary | ICD-10-CM | POA: Insufficient documentation

## 2016-03-04 DIAGNOSIS — Z95828 Presence of other vascular implants and grafts: Secondary | ICD-10-CM

## 2016-03-04 LAB — CBC WITH DIFFERENTIAL/PLATELET
BASO%: 0.3 % (ref 0.0–2.0)
BASOS ABS: 0 10*3/uL (ref 0.0–0.1)
EOS%: 2.3 % (ref 0.0–7.0)
Eosinophils Absolute: 0.2 10*3/uL (ref 0.0–0.5)
HCT: 37.7 % — ABNORMAL LOW (ref 38.4–49.9)
HEMOGLOBIN: 12.5 g/dL — AB (ref 13.0–17.1)
LYMPH%: 28.8 % (ref 14.0–49.0)
MCH: 30.6 pg (ref 27.2–33.4)
MCHC: 33.2 g/dL (ref 32.0–36.0)
MCV: 92.4 fL (ref 79.3–98.0)
MONO#: 0.7 10*3/uL (ref 0.1–0.9)
MONO%: 9.2 % (ref 0.0–14.0)
NEUT#: 4.2 10*3/uL (ref 1.5–6.5)
NEUT%: 59.4 % (ref 39.0–75.0)
Platelets: 229 10*3/uL (ref 140–400)
RBC: 4.08 10*6/uL — ABNORMAL LOW (ref 4.20–5.82)
RDW: 14.5 % (ref 11.0–14.6)
WBC: 7.1 10*3/uL (ref 4.0–10.3)
lymph#: 2 10*3/uL (ref 0.9–3.3)

## 2016-03-04 LAB — COMPREHENSIVE METABOLIC PANEL
ALBUMIN: 3.6 g/dL (ref 3.5–5.0)
ALT: 12 U/L (ref 0–55)
AST: 15 U/L (ref 5–34)
Alkaline Phosphatase: 63 U/L (ref 40–150)
Anion Gap: 8 mEq/L (ref 3–11)
BUN: 14.9 mg/dL (ref 7.0–26.0)
CHLORIDE: 103 meq/L (ref 98–109)
CO2: 22 meq/L (ref 22–29)
Calcium: 8.6 mg/dL (ref 8.4–10.4)
Creatinine: 0.8 mg/dL (ref 0.7–1.3)
EGFR: >90 mL/min/{1.73_m2} (ref >90)
Glucose: 88 mg/dl (ref 70–140)
POTASSIUM: 3.9 meq/L (ref 3.5–5.1)
SODIUM: 134 meq/L — AB (ref 136–145)
Total Bilirubin: 0.33 mg/dL (ref 0.20–1.20)
Total Protein: 6.7 g/dL (ref 6.4–8.3)

## 2016-03-04 MED ORDER — TECHNETIUM TC 99M MEDRONATE IV KIT: 25.0000 | PACK | Freq: Once | INTRAVENOUS | Status: DC | PRN

## 2016-03-04 MED ORDER — SODIUM CHLORIDE 0.9 % IJ SOLN
10.0000 mL | INTRAMUSCULAR | Status: DC | PRN
  Administered 2016-03-04: 10 mL via INTRAVENOUS
  Filled 2016-03-04: qty 10

## 2016-03-04 MED ORDER — HEPARIN SOD (PORK) LOCK FLUSH 100 UNIT/ML IV SOLN
500.0000 [IU] | Freq: Once | INTRAVENOUS | Status: AC | PRN
  Administered 2016-03-04: 500 [IU] via INTRAVENOUS
  Filled 2016-03-04: qty 5

## 2016-03-04 MED ORDER — IOPAMIDOL (ISOVUE-300) INJECTION 61%
100.0000 mL | Freq: Once | INTRAVENOUS | Status: AC | PRN
  Administered 2016-03-04: 100 mL via INTRAVENOUS

## 2016-03-04 NOTE — Telephone Encounter (Signed)
Left message on patient v/m regarding appointment with Dr Alen Blew time change from 3:45 p.m. To 12:15 p.m. For 03/05/16 appt.

## 2016-03-04 NOTE — Patient Instructions (Signed)

## 2016-03-05 ENCOUNTER — Ambulatory Visit: Payer: Medicare Other

## 2016-03-05 ENCOUNTER — Ambulatory Visit (HOSPITAL_BASED_OUTPATIENT_CLINIC_OR_DEPARTMENT_OTHER): Payer: Medicare Other

## 2016-03-05 ENCOUNTER — Ambulatory Visit: Payer: Medicare Other | Admitting: Oncology

## 2016-03-05 ENCOUNTER — Telehealth: Payer: Self-pay | Admitting: Oncology

## 2016-03-05 ENCOUNTER — Ambulatory Visit (HOSPITAL_BASED_OUTPATIENT_CLINIC_OR_DEPARTMENT_OTHER): Payer: Medicare Other | Admitting: Oncology

## 2016-03-05 VITALS — BP 147/86 | HR 74 | Temp 97.7°F | Resp 18

## 2016-03-05 VITALS — BP 149/82 | HR 73 | Temp 97.8°F | Resp 18 | Ht 76.0 in | Wt 193.5 lb

## 2016-03-05 DIAGNOSIS — C61 Malignant neoplasm of prostate: Secondary | ICD-10-CM

## 2016-03-05 DIAGNOSIS — E876 Hypokalemia: Secondary | ICD-10-CM

## 2016-03-05 DIAGNOSIS — C7951 Secondary malignant neoplasm of bone: Secondary | ICD-10-CM | POA: Diagnosis not present

## 2016-03-05 LAB — PSA: Prostate Specific Ag, Serum: 3.1 ng/mL (ref 0.0–4.0)

## 2016-03-05 MED ORDER — DENOSUMAB 120 MG/1.7ML ~~LOC~~ SOLN
120.0000 mg | Freq: Once | SUBCUTANEOUS | Status: AC
  Administered 2016-03-05: 120 mg via SUBCUTANEOUS
  Filled 2016-03-05: qty 1.7

## 2016-03-05 NOTE — Telephone Encounter (Signed)
Appointments scheduled per 1/3 LOS. Patient given AVS report and calendars with future scheduled appointments. °

## 2016-03-05 NOTE — Progress Notes (Signed)
Hematology and Oncology Follow Up Visit  Philip [name] [id number] 1945-06-30 71 y.o. 03/05/2016 12:18 PM No PCP Per PatientNo ref. provider found   Principle Diagnosis: 71 year old gentleman with advanced prostate cancer diagnosed in October 2016. He presented with a PSA of 2900 and widespread bone metastasis as well as pelvic adenopathy.    Prior Therapy: Status post orchiectomy done on November 2016. Status post port placement on 02/09/2015. Taxotere chemotherapy at 75 mg/m to start on 02/16/2015. He is status post 6 cycles of therapy last cycle received in March 2017.  Current therapy: Zytiga 1000 mg daily with prednisone at 5 mg daily started on 09/13/2015.  Interim History:  Philip [name] presents today for a follow-up visit. Since the last visit, he reports being in excellent health and shape without complaints. He continues on Zytiga with prednisone and have tolerated it well. He reports no complications such as nausea, abdominal pain or fatigue. He denied any lower extremity edema.   He remains active and attends to activities of daily living. He continues to exercise periodically and I do not any upper respiratory tract symptoms.Marland Kitchen He continues to work as well without any decline. He does not report any dental complications or any other issues related to [facility].  He does not report any headaches, blurry vision, syncope or seizures. He does not report any fevers, chills, sweats or weight loss. He does not report any chest pain, palpitation, orthopnea. Does not report any cough, wheezing, hemoptysis or dyspnea on exertion. He does not report any nausea, vomiting, abdominal pain, hematochezia. He does not report any frequency, urgency or hesitancy. Does not report any hematuria or dysuria. Does not report any lymphadenopathy or petechiae. Remaining review of systems unremarkable  Medications: I have reviewed the patient's current medications.   Current Outpatient Prescriptions  Medication Sig  Dispense Refill  . abiraterone Acetate (ZYTIGA) 250 MG tablet Take 4 tablets (1,000 mg total) by mouth daily. Take on an empty stomach 1 hour before or 2 hours after a meal 120 tablet 0  . BIOTIN 5000 PO Take 1 tablet by mouth daily.    . Calcium Carbonate-Vitamin D3 (CALCIUM 600+D3) 600-400 MG-UNIT TABS Take 1 tablet by mouth daily.    . Denosumab (XGEVA [place]) Inject into the skin.    . fexofenadine (ALLEGRA) 180 MG tablet Take 180 mg by mouth daily.    . fluticasone (FLONASE) 50 MCG/ACT nasal spray Place 1 spray into both nostrils daily as needed for allergies or rhinitis.    Marland Kitchen FLUZONE HIGH-DOSE 0.5 ML SUSY TO BE ADMINISTERED BY PHARMACIST FOR IMMUNIZATION  0  . Glycerin-Polysorbate 80 (REFRESH DRY EYE THERAPY OP) Apply 1 drop to eye daily.    . halobetasol (ULTRAVATE) 0.05 % ointment Apply 1 application topically as needed. For exzema.    . hydrOXYzine (ATARAX/VISTARIL) 10 MG tablet Take 10 mg by mouth every 8 (eight) hours.  4  . lidocaine-prilocaine (EMLA) cream Apply 1 application topically as needed. Apply to portacath before each chemotherapy. 30 g 0  . Multiple Vitamin (MULTIVITAMIN WITH MINERALS) TABS tablet Take 1 tablet by mouth daily.    . ondansetron (ZOFRAN) 4 MG tablet Take 1 tablet (4 mg total) by mouth every 8 (eight) hours as needed for nausea or vomiting. 20 tablet 0  . predniSONE (DELTASONE) 5 MG tablet TAKE 1 TABLET BY MOUTH DAILY WITH BREAKFAST 60 tablet 0  . terbinafine (LAMISIL) 1 % cream Apply 1 application topically 2 (two) times daily.    Marland Kitchen tretinoin (RETIN-A)  0.1 % cream Apply 1 application topically at bedtime.     No current facility-administered medications for this visit.    Facility-Administered Medications Ordered in Other Visits  Medication Dose Route Frequency Provider Last Rate Last Dose  . alteplase (CATHFLO ACTIVASE) injection 2 mg  2 mg Intracatheter Once PRN [name], MD      . technetium medronate (TC-MDP) injection 25 millicurie  25 millicurie  Intravenous Once PRN Lorriane Shire, MD         Allergies: No Known Allergies  Past Medical History, Surgical history, Social history, and Family History were reviewed and updated.   Physical Exam: Blood pressure (!) 149/82, pulse 73, temperature 97.8 F (36.6 C), temperature source Oral, resp. rate 18, height 6\' 4"  (1.93 m), weight 193 lb 8 oz (87.8 kg), SpO2 100 %. ECOG: 0 General appearance: Well-appearing gentleman without distress. Head: Normocephalic, without obvious abnormality No oral ulcers or lesions. Neck: no adenopathy Lymph nodes: Cervical, supraclavicular, and axillary nodes normal. Heart:regular rate and rhythm, S1, S2 normal, no murmur, click, rub or gallop Lung:chest clear, no wheezing, rales, normal symmetric air entry Abdomin: soft, non-tender, without masses or organomegaly no shifting dullness or ascites. EXT: Very mild edema noted on the left side.      Lab Results: Lab Results  Component Value Date   WBC 7.1 03/04/2016   HGB 12.5 (L) 03/04/2016   HCT 37.7 (L) 03/04/2016   MCV 92.4 03/04/2016   PLT 229 03/04/2016     Chemistry      Component Value Date/Time   NA 134 (L) 03/04/2016 0945   K 3.9 03/04/2016 0945   CL 104 02/09/2015 1305   CO2 22 03/04/2016 0945   BUN 14.9 03/04/2016 0945   CREATININE 0.8 03/04/2016 0945      Component Value Date/Time   CALCIUM 8.6 03/04/2016 0945   ALKPHOS 63 03/04/2016 0945   AST 15 03/04/2016 0945   ALT 12 03/04/2016 0945   BILITOT 0.33 03/04/2016 0945       Results for Philip [name], Philip [name] (MRN [id number]) as of 03/05/2016 12:01  Ref. Range 02/06/2016 11:55 03/04/2016 09:45  PSA Latest Ref Range: 0.0 - 4.0 ng/mL 5.0 (H) 3.1   IMPRESSION: 1. Interval decrease in size of the retroperitoneal and pelvic lymph nodes as described above. No new or progressive adenopathy. 2. Diffuse and fairly extensive but stable appearing sclerotic osseous metastatic disease. 3. Stable hepatic and renal cysts.  IMPRESSION: Widespread  bony metastatic disease, essentially stable from prior study. Kidneys noted bilaterally.    Impression and Plan:  71 year old gentleman with the following issues:  1. Advanced prostate cancer diagnosed in October 2016. He presented with PSA of 2900 and unknown Gleason score. His disease includes widespread bony metastasis as well as pelvic adenopathy. He is status post bilateral orchiectomy done in November 2016.  He is S/P Taxotere at 75 mg/m for a total of 6 cycles that is well-tolerated.  He is currently on Zytiga and continues to tolerated well.  PSA as well as imaging studies obtained on 03/04/2016 were reviewed today. His PSA continues to be under control and have decreased to 3.1. Imaging studies did not show any evidence of metastatic progression of his disease. The plan is to continue with Zytiga at this time.  2. IV access: Port-A-Cath  in place without complications. This will be flushed every visit and labs will be obtained during these flushes.  3. Hypertension: His blood pressure appears to be within normal range on Zytiga.  4. Hormone therapy: He is status post bilateral orchiectomy.   5. Bone directed therapy: Risks and benefits of continuing Xgeva were reviewed and he is agreeable to continue. I continue to counsel him about complications related to that including osteonecrosis of the jaw and hypocalcemia.  6. Hypokalemia. His potassium is within normal range and will be repeated periodically.  7. Follow-up: He will also be in 4  weeks to monitor his clinical status.    Zola Button, MD 1/3/201812:18 PM

## 2016-03-20 ENCOUNTER — Other Ambulatory Visit: Payer: Self-pay | Admitting: *Deleted

## 2016-03-20 DIAGNOSIS — C61 Malignant neoplasm of prostate: Secondary | ICD-10-CM

## 2016-03-20 MED ORDER — ABIRATERONE ACETATE 250 MG PO TABS: 1000.0000 mg | ORAL_TABLET | Freq: Every day | ORAL | 0 refills | Status: DC

## 2016-04-01 ENCOUNTER — Ambulatory Visit (HOSPITAL_BASED_OUTPATIENT_CLINIC_OR_DEPARTMENT_OTHER): Payer: Medicare Other

## 2016-04-01 ENCOUNTER — Other Ambulatory Visit (HOSPITAL_BASED_OUTPATIENT_CLINIC_OR_DEPARTMENT_OTHER): Payer: Medicare Other

## 2016-04-01 DIAGNOSIS — Z95828 Presence of other vascular implants and grafts: Secondary | ICD-10-CM

## 2016-04-01 DIAGNOSIS — C61 Malignant neoplasm of prostate: Secondary | ICD-10-CM

## 2016-04-01 LAB — COMPREHENSIVE METABOLIC PANEL
ALK PHOS: 56 U/L (ref 40–150)
ALT: 15 U/L (ref 0–55)
AST: 16 U/L (ref 5–34)
Albumin: 3.8 g/dL (ref 3.5–5.0)
Anion Gap: 8 mEq/L (ref 3–11)
BUN: 15.7 mg/dL (ref 7.0–26.0)
CHLORIDE: 104 meq/L (ref 98–109)
CO2: 24 meq/L (ref 22–29)
CREATININE: 0.8 mg/dL (ref 0.7–1.3)
Calcium: 9.3 mg/dL (ref 8.4–10.4)
EGFR: >90 mL/min/{1.73_m2} (ref >90)
Glucose: 100 mg/dl (ref 70–140)
POTASSIUM: 4.1 meq/L (ref 3.5–5.1)
SODIUM: 136 meq/L (ref 136–145)
Total Bilirubin: 0.42 mg/dL (ref 0.20–1.20)
Total Protein: 6.5 g/dL (ref 6.4–8.3)

## 2016-04-01 LAB — CBC WITH DIFFERENTIAL/PLATELET
BASO%: 0.3 % (ref 0.0–2.0)
Basophils Absolute: 0 10*3/uL (ref 0.0–0.1)
EOS%: 2.8 % (ref 0.0–7.0)
Eosinophils Absolute: 0.2 10*3/uL (ref 0.0–0.5)
HCT: 37.9 % — ABNORMAL LOW (ref 38.4–49.9)
HGB: 12.5 g/dL — ABNORMAL LOW (ref 13.0–17.1)
LYMPH#: 1.2 10*3/uL (ref 0.9–3.3)
LYMPH%: 21.2 % (ref 14.0–49.0)
MCH: 30.5 pg (ref 27.2–33.4)
MCHC: 33 g/dL (ref 32.0–36.0)
MCV: 92.4 fL (ref 79.3–98.0)
MONO#: 0.6 10*3/uL (ref 0.1–0.9)
MONO%: 10.2 % (ref 0.0–14.0)
NEUT%: 65.5 % (ref 39.0–75.0)
NEUTROS ABS: 3.8 10*3/uL (ref 1.5–6.5)
Platelets: 218 10*3/uL (ref 140–400)
RBC: 4.1 10*6/uL — ABNORMAL LOW (ref 4.20–5.82)
RDW: 14.6 % (ref 11.0–14.6)
WBC: 5.8 10*3/uL (ref 4.0–10.3)
nRBC: 0 % (ref 0–0)

## 2016-04-01 MED ORDER — SODIUM CHLORIDE 0.9 % IJ SOLN
10.0000 mL | INTRAMUSCULAR | Status: DC | PRN
  Administered 2016-04-01: 10 mL via INTRAVENOUS
  Filled 2016-04-01: qty 10

## 2016-04-01 MED ORDER — HEPARIN SOD (PORK) LOCK FLUSH 100 UNIT/ML IV SOLN
500.0000 [IU] | Freq: Once | INTRAVENOUS | Status: AC | PRN
  Administered 2016-04-01: 500 [IU] via INTRAVENOUS
  Filled 2016-04-01: qty 5

## 2016-04-01 NOTE — Patient Instructions (Signed)

## 2016-04-02 LAB — PSA: Prostate Specific Ag, Serum: 1.9 ng/mL (ref 0.0–4.0)

## 2016-04-03 ENCOUNTER — Ambulatory Visit (HOSPITAL_BASED_OUTPATIENT_CLINIC_OR_DEPARTMENT_OTHER): Payer: Medicare Other

## 2016-04-03 ENCOUNTER — Ambulatory Visit (HOSPITAL_BASED_OUTPATIENT_CLINIC_OR_DEPARTMENT_OTHER): Payer: Medicare Other | Admitting: Oncology

## 2016-04-03 ENCOUNTER — Telehealth: Payer: Self-pay | Admitting: Oncology

## 2016-04-03 VITALS — Wt 194.5 lb

## 2016-04-03 VITALS — BP 154/77 | HR 77 | Temp 97.8°F | Resp 16

## 2016-04-03 DIAGNOSIS — C7951 Secondary malignant neoplasm of bone: Secondary | ICD-10-CM | POA: Diagnosis not present

## 2016-04-03 DIAGNOSIS — I10 Essential (primary) hypertension: Secondary | ICD-10-CM | POA: Diagnosis not present

## 2016-04-03 DIAGNOSIS — C61 Malignant neoplasm of prostate: Secondary | ICD-10-CM

## 2016-04-03 DIAGNOSIS — E876 Hypokalemia: Secondary | ICD-10-CM | POA: Diagnosis not present

## 2016-04-03 MED ORDER — DENOSUMAB 120 MG/1.7ML ~~LOC~~ SOLN
120.0000 mg | Freq: Once | SUBCUTANEOUS | Status: AC
  Administered 2016-04-03: 120 mg via SUBCUTANEOUS
  Filled 2016-04-03: qty 1.7

## 2016-04-03 NOTE — Progress Notes (Signed)
Hematology and Oncology Follow Up Visit  Philip [name] [id number] 01-Jan-1946 71 y.o. 04/03/2016 4:04 PM No PCP Per PatientNo ref. provider found   Principle Diagnosis: 71 year old gentleman with advanced prostate cancer diagnosed in October 2016. He presented with a PSA of 2900 and widespread bone metastasis as well as pelvic adenopathy.    Prior Therapy: Status post orchiectomy done on November 2016. Status post port placement on 02/09/2015. Taxotere chemotherapy at 75 mg/m to start on 02/16/2015. He is status post 6 cycles of therapy last cycle received in March 2017.  Current therapy: Zytiga 1000 mg daily with prednisone at 5 mg daily started on 09/13/2015.  Interim History:  Philip [name] presents today for a follow-up visit. Since the last visit, he reports no changes in his health and remains completely asymptomatic. He continues to work full time and his quality of life remains excellent. He continues on Zytiga with prednisone and have tolerated it well. He reports no complications such as nausea, abdominal pain or fatigue. He denied any lower extremity edema. He denied any excessive fatigue or tiredness. He does report mild lower extremity edema which is chronic in nature and have not changed.  He does not report any headaches, blurry vision, syncope or seizures. He does not report any fevers, chills, sweats or weight loss. He does not report any chest pain, palpitation, orthopnea. Does not report any cough, wheezing, hemoptysis or dyspnea on exertion. He does not report any nausea, vomiting, abdominal pain, hematochezia. He does not report any frequency, urgency or hesitancy. Does not report any hematuria or dysuria. Does not report any lymphadenopathy or petechiae. Remaining review of systems unremarkable  Medications: I have reviewed the patient's current medications.   Current Outpatient Prescriptions  Medication Sig Dispense Refill  . abiraterone Acetate (ZYTIGA) 250 MG tablet Take 4  tablets (1,000 mg total) by mouth daily. Take on an empty stomach 1 hour before or 2 hours after a meal 120 tablet 0  . BIOTIN 5000 PO Take 1 tablet by mouth daily.    . Calcium Carbonate-Vitamin D3 (CALCIUM 600+D3) 600-400 MG-UNIT TABS Take 1 tablet by mouth daily.    . Denosumab (XGEVA [place]) Inject into the skin.    . fexofenadine (ALLEGRA) 180 MG tablet Take 180 mg by mouth daily.    . fluticasone (FLONASE) 50 MCG/ACT nasal spray Place 1 spray into both nostrils daily as needed for allergies or rhinitis.    Marland Kitchen FLUZONE HIGH-DOSE 0.5 ML SUSY TO BE ADMINISTERED BY PHARMACIST FOR IMMUNIZATION  0  . Glycerin-Polysorbate 80 (REFRESH DRY EYE THERAPY OP) Apply 1 drop to eye daily.    . halobetasol (ULTRAVATE) 0.05 % ointment Apply 1 application topically as needed. For exzema.    . hydrOXYzine (ATARAX/VISTARIL) 10 MG tablet Take 10 mg by mouth every 8 (eight) hours.  4  . lidocaine-prilocaine (EMLA) cream Apply 1 application topically as needed. Apply to portacath before each chemotherapy. 30 g 0  . Multiple Vitamin (MULTIVITAMIN WITH MINERALS) TABS tablet Take 1 tablet by mouth daily.    . ondansetron (ZOFRAN) 4 MG tablet Take 1 tablet (4 mg total) by mouth every 8 (eight) hours as needed for nausea or vomiting. 20 tablet 0  . predniSONE (DELTASONE) 5 MG tablet TAKE 1 TABLET BY MOUTH DAILY WITH BREAKFAST 60 tablet 0  . terbinafine (LAMISIL) 1 % cream Apply 1 application topically 2 (two) times daily.    Marland Kitchen tretinoin (RETIN-A) 0.1 % cream Apply 1 application topically at bedtime.  No current facility-administered medications for this visit.    Facility-Administered Medications Ordered in Other Visits  Medication Dose Route Frequency Provider Last Rate Last Dose  . alteplase (CATHFLO ACTIVASE) injection 2 mg  2 mg Intracatheter Once PRN Philip Portela, MD         Allergies: No Known Allergies  Past Medical History, Surgical history, Social history, and Family History were reviewed and  updated.   Physical Exam: Weight 194 lb 8 oz (88.2 kg). His blood pressure is 154/77, pulse 77 and he was afebrile. ECOG: 0 General appearance: Alert, awake gentleman without distress. Head: Normocephalic, without obvious abnormality No oral thrush or ulcers. Neck: no adenopathy Lymph nodes: Cervical, supraclavicular, and axillary nodes normal. Heart:regular rate and rhythm, S1, S2 normal, no murmur, click, rub or gallop Lung:chest clear, no wheezing, rales, normal symmetric air entry Abdomin: soft, non-tender, without masses or organomegaly no rebound or guarding. EXT: mild edema noted on the left side.      Lab Results: Lab Results  Component Value Date   WBC 5.8 04/01/2016   HGB 12.5 (L) 04/01/2016   HCT 37.9 (L) 04/01/2016   MCV 92.4 04/01/2016   PLT 218 04/01/2016     Chemistry      Component Value Date/Time   NA 136 04/01/2016 0912   K 4.1 04/01/2016 0912   CL 104 02/09/2015 1305   CO2 24 04/01/2016 0912   BUN 15.7 04/01/2016 0912   CREATININE 0.8 04/01/2016 0912      Component Value Date/Time   CALCIUM 9.3 04/01/2016 0912   ALKPHOS 56 04/01/2016 0912   AST 16 04/01/2016 0912   ALT 15 04/01/2016 0912   BILITOT 0.42 04/01/2016 0912     Results for Philip [name] (MRN [id number]) as of 04/03/2016 15:41  Ref. Range 02/06/2016 11:55 03/04/2016 09:45 04/01/2016 09:12  PSA Latest Ref Range: 0.0 - 4.0 ng/mL 5.0 (H) 3.1 1.9      Impression and Plan:  71 year old gentleman with the following issues:  1. Advanced prostate cancer diagnosed in October 2016. He presented with PSA of 2900 and unknown Gleason score. His disease includes widespread bony metastasis as well as pelvic adenopathy. He is status post bilateral orchiectomy done in November 2016.  He is S/P Taxotere at 75 mg/m for a total of 6 cycles that is well-tolerated.  He is currently on Zytiga and continues to tolerated well.  PSA continues to decline on Zytiga down to 1.9. Risks and benefits of continuing  this medication were reviewed today and is agreeable to continue.  2. IV access: Port-A-Cath  in place without complications. This will be flushed every visit and labs will be obtained during these flushes.  3. Hypertension: His blood pressure slightly elevated but asymptomatic at this time. We'll continue to monitor on Zytiga.  4. Hormone therapy: He is status post bilateral orchiectomy.   5. Bone directed therapy: Risks and benefits of continuing Xgeva were reviewed and he is agreeable to continue. I continue to counsel him about complications related to that including osteonecrosis of the jaw and hypocalcemia.  6. Hypokalemia. His potassium is within normal range and will be repeated periodically.  7. Follow-up: He will also be in 4  weeks to monitor his clinical status.    [facility], MD 2/1/20184:04 PM

## 2016-04-03 NOTE — Telephone Encounter (Signed)
Appointments scheduled per 04/03/16 los. Patient was given a copy of the appointment schedule and AVS report per 04/03/16 los. °

## 2016-04-03 NOTE — Patient Instructions (Signed)
Denosumab injection What is this medicine? DENOSUMAB (den [place] mab) slows bone breakdown. Prolia is used to treat osteoporosis in women after menopause and in men. Xgeva is used to prevent bone fractures and other bone problems caused by cancer bone metastases. Xgeva is also used to treat giant cell tumor of the bone. COMMON BRAND NAME(S): Prolia, XGEVA What should I tell my health care provider before I take this medicine? They need to know if you have any of these conditions: -dental disease -eczema -infection or history of infections -kidney disease or on dialysis -low blood calcium or vitamin D -malabsorption syndrome -scheduled to have surgery or tooth extraction -taking medicine that contains denosumab -thyroid or parathyroid disease -an unusual reaction to denosumab, other medicines, foods, dyes, or preservatives -pregnant or trying to get pregnant -breast-feeding How should I use this medicine? This medicine is for injection under the skin. It is given by a health care professional in a hospital or clinic setting. If you are getting Prolia, a special MedGuide will be given to you by the pharmacist with each prescription and refill. Be sure to read this information carefully each time. For Prolia, talk to your pediatrician regarding the use of this medicine in children. Special care may be needed. For Xgeva, talk to your pediatrician regarding the use of this medicine in children. While this drug may be prescribed for children as young as 13 years for selected conditions, precautions do apply. What if I miss a dose? It is important not to miss your dose. Call your doctor or health care professional if you are unable to keep an appointment. What may interact with this medicine? Do not take this medicine with any of the following medications: -other medicines containing denosumab This medicine may also interact with the following medications: -medicines that suppress the immune  system -medicines that treat cancer -steroid medicines like prednisone or cortisone What should I watch for while using this medicine? Visit your doctor or health care professional for regular checks on your progress. Your doctor or health care professional may order blood tests and other tests to see how you are doing. Call your doctor or health care professional if you get a cold or other infection while receiving this medicine. Do not treat yourself. This medicine may decrease your body's ability to fight infection. You should make sure you get enough calcium and vitamin D while you are taking this medicine, unless your doctor tells you not to. Discuss the foods you eat and the vitamins you take with your health care professional. See your dentist regularly. Brush and floss your teeth as directed. Before you have any dental work done, tell your dentist you are receiving this medicine. Do not become pregnant while taking this medicine or for 5 months after stopping it. Women should inform their doctor if they wish to become pregnant or think they might be pregnant. There is a potential for serious side effects to an unborn child. Talk to your health care professional or pharmacist for more information. What side effects may I notice from receiving this medicine? Side effects that you should report to your doctor or health care professional as soon as possible: -allergic reactions like skin rash, itching or hives, swelling of the face, lips, or tongue -breathing problems -chest pain -fast, irregular heartbeat -feeling faint or lightheaded, falls -fever, chills, or any other sign of infection -muscle spasms, tightening, or twitches -numbness or tingling -skin blisters or bumps, or is dry, peels, or red -slow   healing or unexplained pain in the mouth or jaw -unusual bleeding or bruising Side effects that usually do not require medical attention (report to your doctor or health care professional  if they continue or are bothersome): -muscle pain -stomach upset, gas Where should I keep my medicine? This medicine is only given in a clinic, doctor's office, or other health care setting and will not be stored at home.  2017 Elsevier/Gold Standard (2015-03-22 10:06:55)  

## 2016-04-08 ENCOUNTER — Encounter: Payer: Self-pay | Admitting: *Deleted

## 2016-04-16 ENCOUNTER — Other Ambulatory Visit: Payer: Self-pay | Admitting: *Deleted

## 2016-04-16 DIAGNOSIS — C61 Malignant neoplasm of prostate: Secondary | ICD-10-CM

## 2016-04-16 MED ORDER — ABIRATERONE ACETATE 250 MG PO TABS: 1000.0000 mg | ORAL_TABLET | Freq: Every day | ORAL | 0 refills | Status: DC

## 2016-04-30 ENCOUNTER — Other Ambulatory Visit (HOSPITAL_BASED_OUTPATIENT_CLINIC_OR_DEPARTMENT_OTHER): Payer: Medicare Other

## 2016-04-30 ENCOUNTER — Ambulatory Visit (HOSPITAL_BASED_OUTPATIENT_CLINIC_OR_DEPARTMENT_OTHER): Payer: Medicare Other

## 2016-04-30 DIAGNOSIS — Z95828 Presence of other vascular implants and grafts: Secondary | ICD-10-CM

## 2016-04-30 DIAGNOSIS — C61 Malignant neoplasm of prostate: Secondary | ICD-10-CM | POA: Diagnosis not present

## 2016-04-30 LAB — COMPREHENSIVE METABOLIC PANEL
ALK PHOS: 56 U/L (ref 40–150)
ALT: 15 U/L (ref 0–55)
ANION GAP: 7 meq/L (ref 3–11)
AST: 17 U/L (ref 5–34)
Albumin: 4 g/dL (ref 3.5–5.0)
BILIRUBIN TOTAL: 0.52 mg/dL (ref 0.20–1.20)
BUN: 16.6 mg/dL (ref 7.0–26.0)
CHLORIDE: 104 meq/L (ref 98–109)
CO2: 25 mEq/L (ref 22–29)
CREATININE: 0.8 mg/dL (ref 0.7–1.3)
Calcium: 9.5 mg/dL (ref 8.4–10.4)
EGFR: 89 mL/min/{1.73_m2} — ABNORMAL LOW (ref >90)
Glucose: 96 mg/dl (ref 70–140)
Potassium: 4.3 mEq/L (ref 3.5–5.1)
Sodium: 136 mEq/L (ref 136–145)
TOTAL PROTEIN: 6.7 g/dL (ref 6.4–8.3)

## 2016-04-30 LAB — CBC WITH DIFFERENTIAL/PLATELET
BASO%: 0.3 % (ref 0.0–2.0)
Basophils Absolute: 0 10*3/uL (ref 0.0–0.1)
EOS%: 2.3 % (ref 0.0–7.0)
Eosinophils Absolute: 0.2 10*3/uL (ref 0.0–0.5)
HEMATOCRIT: 40 % (ref 38.4–49.9)
HGB: 13 g/dL (ref 13.0–17.1)
LYMPH#: 1.8 10*3/uL (ref 0.9–3.3)
LYMPH%: 27.5 % (ref 14.0–49.0)
MCH: 30.5 pg (ref 27.2–33.4)
MCHC: 32.5 g/dL (ref 32.0–36.0)
MCV: 93.9 fL (ref 79.3–98.0)
MONO#: 0.6 10*3/uL (ref 0.1–0.9)
MONO%: 8.7 % (ref 0.0–14.0)
NEUT#: 4 10*3/uL (ref 1.5–6.5)
NEUT%: 61.2 % (ref 39.0–75.0)
PLATELETS: 210 10*3/uL (ref 140–400)
RBC: 4.26 10*6/uL (ref 4.20–5.82)
RDW: 14.5 % (ref 11.0–14.6)
WBC: 6.5 10*3/uL (ref 4.0–10.3)

## 2016-04-30 MED ORDER — HEPARIN SOD (PORK) LOCK FLUSH 100 UNIT/ML IV SOLN
500.0000 [IU] | Freq: Once | INTRAVENOUS | Status: AC | PRN
  Administered 2016-04-30: 500 [IU] via INTRAVENOUS
  Filled 2016-04-30: qty 5

## 2016-04-30 MED ORDER — SODIUM CHLORIDE 0.9 % IJ SOLN
10.0000 mL | INTRAMUSCULAR | Status: DC | PRN
  Administered 2016-04-30: 10 mL via INTRAVENOUS
  Filled 2016-04-30: qty 10

## 2016-05-01 ENCOUNTER — Telehealth: Payer: Self-pay | Admitting: Oncology

## 2016-05-01 ENCOUNTER — Ambulatory Visit (HOSPITAL_BASED_OUTPATIENT_CLINIC_OR_DEPARTMENT_OTHER): Payer: Medicare Other

## 2016-05-01 ENCOUNTER — Ambulatory Visit (HOSPITAL_BASED_OUTPATIENT_CLINIC_OR_DEPARTMENT_OTHER): Payer: Medicare Other | Admitting: Oncology

## 2016-05-01 VITALS — BP 141/82 | HR 80 | Temp 98.0°F | Resp 18 | Ht 76.0 in | Wt 197.5 lb

## 2016-05-01 DIAGNOSIS — I10 Essential (primary) hypertension: Secondary | ICD-10-CM | POA: Diagnosis not present

## 2016-05-01 DIAGNOSIS — C7951 Secondary malignant neoplasm of bone: Secondary | ICD-10-CM

## 2016-05-01 DIAGNOSIS — C61 Malignant neoplasm of prostate: Secondary | ICD-10-CM

## 2016-05-01 DIAGNOSIS — Z95828 Presence of other vascular implants and grafts: Secondary | ICD-10-CM

## 2016-05-01 DIAGNOSIS — E876 Hypokalemia: Secondary | ICD-10-CM

## 2016-05-01 LAB — PSA: Prostate Specific Ag, Serum: 1.6 ng/mL (ref 0.0–4.0)

## 2016-05-01 MED ORDER — ALTEPLASE 2 MG IJ SOLR
2.0000 mg | Freq: Once | INTRAMUSCULAR | Status: DC | PRN
  Filled 2016-05-01: qty 2

## 2016-05-01 MED ORDER — DENOSUMAB 120 MG/1.7ML ~~LOC~~ SOLN
120.0000 mg | Freq: Once | SUBCUTANEOUS | Status: AC
  Administered 2016-05-01: 120 mg via SUBCUTANEOUS
  Filled 2016-05-01: qty 1.7

## 2016-05-01 NOTE — Progress Notes (Signed)
Hematology and Oncology Follow Up Visit  Philip [name] [id number] 04-24-1945 72 y.o. 05/01/2016 1:05 PM No PCP Per PatientNo ref. provider found   Principle Diagnosis: 71 year old gentleman with advanced prostate cancer diagnosed in October 2016. He presented with a PSA of 2900 and widespread bone metastasis as well as pelvic adenopathy.    Prior Therapy: Status post orchiectomy done on November 2016. Status post port placement on 02/09/2015. Taxotere chemotherapy at 75 mg/m to start on 02/16/2015. He is status post 6 cycles of therapy last cycle received in March 2017.  Current therapy: Zytiga 1000 mg daily with prednisone at 5 mg daily started on 09/13/2015.  Interim History:  Philip [name] presents today for a follow-up visit. Since the last visit, he reports continuous improvement in his health. He remains a very active and works part-time. He is able to walk regularly and attends activities of daily living.    He continues on Zytiga with prednisone and have tolerated it well. He reports no complications such as nausea, abdominal pain or fatigue. He denied any lower extremity edema. He denied any excessive fatigue or tiredness. 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 Interfering with his function or ability to use his hands.  He does not report any headaches, blurry vision, syncope or seizures. He does not report any fevers, chills, sweats or weight loss. He does not report any chest pain, palpitation, orthopnea. Does not report any cough, wheezing, hemoptysis or dyspnea on exertion. He does not report any nausea, vomiting, abdominal pain, hematochezia. He does not report any frequency, urgency or hesitancy. Does not report any hematuria or dysuria. Does not report any lymphadenopathy or petechiae. Remaining review of systems unremarkable  Medications: I have reviewed the patient's current medications.   Current Outpatient Prescriptions  Medication  Sig Dispense Refill  . abiraterone Acetate (ZYTIGA) 250 MG tablet Take 4 tablets (1,000 mg total) by mouth daily. Take on an empty stomach 1 hour before or 2 hours after a meal 120 tablet 0  . BIOTIN 5000 PO Take 1 tablet by mouth daily.    . Calcium Carbonate-Vitamin D3 (CALCIUM 600+D3) 600-400 MG-UNIT TABS Take 1 tablet by mouth daily.    . Denosumab (XGEVA [place]) Inject into the skin.    . fexofenadine (ALLEGRA) 180 MG tablet Take 180 mg by mouth daily.    . fluticasone (FLONASE) 50 MCG/ACT nasal spray Place 1 spray into both nostrils daily as needed for allergies or rhinitis.    Marland Kitchen FLUZONE HIGH-DOSE 0.5 ML SUSY TO BE ADMINISTERED BY PHARMACIST FOR IMMUNIZATION  0  . Glycerin-Polysorbate 80 (REFRESH DRY EYE THERAPY OP) Apply 1 drop to eye daily.    . halobetasol (ULTRAVATE) 0.05 % ointment Apply 1 application topically as needed. For exzema.    . hydrOXYzine (ATARAX/VISTARIL) 10 MG tablet Take 10 mg by mouth every 8 (eight) hours.  4  . lidocaine-prilocaine (EMLA) cream Apply 1 application topically as needed. Apply to portacath before each chemotherapy. 30 g 0  . Multiple Vitamin (MULTIVITAMIN WITH MINERALS) TABS tablet Take 1 tablet by mouth daily.    . ondansetron (ZOFRAN) 4 MG tablet Take 1 tablet (4 mg total) by mouth every 8 (eight) hours as needed for nausea or vomiting. 20 tablet 0  . predniSONE (DELTASONE) 5 MG tablet TAKE 1 TABLET BY MOUTH DAILY WITH BREAKFAST 60 tablet 0  . terbinafine (LAMISIL) 1 % cream Apply 1 application topically 2 (two) times daily.    Marland Kitchen tretinoin (  RETIN-A) 0.1 % cream Apply 1 application topically at bedtime.     No current facility-administered medications for this visit.    Facility-Administered Medications Ordered in Other Visits  Medication Dose Route Frequency Provider Last Rate Last Dose  . alteplase (CATHFLO ACTIVASE) injection 2 mg  2 mg Intracatheter Once PRN [name], MD      . alteplase (CATHFLO ACTIVASE) injection 2 mg  2 mg Intracatheter  Once PRN [name], MD      . denosumab (XGEVA) injection 120 mg  120 mg Subcutaneous Once [name], MD         Allergies: No Known Allergies  Past Medical History, Surgical history, Social history, and Family History were reviewed and updated.   Physical Exam: Blood pressure (!) 141/82, pulse 80, temperature 98 F (36.7 C), temperature source Oral, resp. rate 18, height 6\' 4"  (1.93 m), weight 197 lb 8 oz (89.6 kg), SpO2 100 %.  ECOG: 0 General appearance: Well-appearing gentleman without distress. Head: Normocephalic, without obvious abnormality No oral ulcers or lesions. Neck: no adenopathy Lymph nodes: Cervical, supraclavicular, and axillary nodes normal. Heart:regular rate and rhythm, S1, S2 normal, no murmur, click, rub or gallop Lung:chest clear, no wheezing, rales, normal symmetric air entry Abdomin: soft, non-tender, without masses or organomegaly no shifting dullness or ascites. EXT: mild edema noted on the left side not changed from previously.      Lab Results: Lab Results  Component Value Date   WBC 6.5 04/30/2016   HGB 13.0 04/30/2016   HCT 40.0 04/30/2016   MCV 93.9 04/30/2016   PLT 210 04/30/2016     Chemistry      Component Value Date/Time   NA 136 04/30/2016 0955   K 4.3 04/30/2016 0955   CL 104 02/09/2015 1305   CO2 25 04/30/2016 0955   BUN 16.6 04/30/2016 0955   CREATININE 0.8 04/30/2016 0955      Component Value Date/Time   CALCIUM 9.5 04/30/2016 0955   ALKPHOS 56 04/30/2016 0955   AST 17 04/30/2016 0955   ALT 15 04/30/2016 0955   BILITOT 0.52 04/30/2016 0955      Results for Philip [name], Philip [name] (MRN [id number]) as of 05/01/2016 12:49  Ref. Range 03/04/2016 09:45 04/01/2016 09:12 04/30/2016 09:55  PSA Latest Ref Range: 0.0 - 4.0 ng/mL 3.1 1.9 1.6      Impression and Plan:  71 year old gentleman with the following issues:  1. Advanced prostate cancer diagnosed in October 2016. He presented with PSA of 2900 and unknown Gleason score.  His disease includes widespread bony metastasis as well as pelvic adenopathy. He is status post bilateral orchiectomy done in November 2016.  He is S/P Taxotere at 75 mg/m for a total of 6 cycles that is well-tolerated. His PSA nadir was 5.4.  He started [place] in July 2017 Zytiga and continues to tolerated well. His PSA continues to respond down to 1.6.  Risks and benefits of continuing this medication were reviewed today and he is agreeable to continue.  2. IV access: Port-A-Cath  in place without complications. This will be flushed every visit.  3. Hypertension: His blood remains close to normal range at this time.  4. Hormone therapy: He is status post bilateral orchiectomy.   5. Bone directed therapy: Risks and benefits of continuing Xgeva were reviewed. I continue to counsel him about complications related to that including osteonecrosis of the jaw and hypocalcemia. He is agreeable to continue.  6. Hypokalemia. His potassium is within normal  range and will be repeated periodically.  7. Follow-up: He will also be in 4  weeks to monitor his clinical status.    Zola Button, MD 3/1/20181:05 PM

## 2016-05-01 NOTE — Patient Instructions (Signed)
Denosumab injection  What is this medicine?  DENOSUMAB (den oh sue mab) slows bone breakdown. Prolia is used to treat osteoporosis in women after menopause and in men. Xgeva is used to prevent bone fractures and other bone problems caused by cancer bone metastases. Xgeva is also used to treat giant cell tumor of the bone.  This medicine may be used for other purposes; ask your health care provider or pharmacist if you have questions.  What should I tell my health care provider before I take this medicine?  They need to know if you have any of these conditions:  -dental disease  -eczema  -infection or history of infections  -kidney disease or on dialysis  -low blood calcium or vitamin D  -malabsorption syndrome  -scheduled to have surgery or tooth extraction  -taking medicine that contains denosumab  -thyroid or parathyroid disease  -an unusual reaction to denosumab, other medicines, foods, dyes, or preservatives  -pregnant or trying to get pregnant  -breast-feeding  How should I use this medicine?  This medicine is for injection under the skin. It is given by a health care professional in a hospital or clinic setting.  If you are getting Prolia, a special MedGuide will be given to you by the pharmacist with each prescription and refill. Be sure to read this information carefully each time.  For Prolia, talk to your pediatrician regarding the use of this medicine in children. Special care may be needed. For Xgeva, talk to your pediatrician regarding the use of this medicine in children. While this drug may be prescribed for children as young as 13 years for selected conditions, precautions do apply.  Overdosage: If you think you have taken too much of this medicine contact a poison control center or emergency room at once.  NOTE: This medicine is only for you. Do not share this medicine with others.  What if I miss a dose?  It is important not to miss your dose. Call your doctor or health care professional if you are  unable to keep an appointment.  What may interact with this medicine?  Do not take this medicine with any of the following medications:  -other medicines containing denosumab  This medicine may also interact with the following medications:  -medicines that suppress the immune system  -medicines that treat cancer  -steroid medicines like prednisone or cortisone  This list may not describe all possible interactions. Give your health care provider a list of all the medicines, herbs, non-prescription drugs, or dietary supplements you use. Also tell them if you smoke, drink alcohol, or use illegal drugs. Some items may interact with your medicine.  What should I watch for while using this medicine?  Visit your doctor or health care professional for regular checks on your progress. Your doctor or health care professional may order blood tests and other tests to see how you are doing.  Call your doctor or health care professional if you get a cold or other infection while receiving this medicine. Do not treat yourself. This medicine may decrease your body's ability to fight infection.  You should make sure you get enough calcium and vitamin D while you are taking this medicine, unless your doctor tells you not to. Discuss the foods you eat and the vitamins you take with your health care professional.  See your dentist regularly. Brush and floss your teeth as directed. Before you have any dental work done, tell your dentist you are receiving this medicine.  Do   not become pregnant while taking this medicine or for 5 months after stopping it. Women should inform their doctor if they wish to become pregnant or think they might be pregnant. There is a potential for serious side effects to an unborn child. Talk to your health care professional or pharmacist for more information.  What side effects may I notice from receiving this medicine?  Side effects that you should report to your doctor or health care professional as soon as  possible:  -allergic reactions like skin rash, itching or hives, swelling of the face, lips, or tongue  -breathing problems  -chest pain  -fast, irregular heartbeat  -feeling faint or lightheaded, falls  -fever, chills, or any other sign of infection  -muscle spasms, tightening, or twitches  -numbness or tingling  -skin blisters or bumps, or is dry, peels, or red  -slow healing or unexplained pain in the mouth or jaw  -unusual bleeding or bruising  Side effects that usually do not require medical attention (Report these to your doctor or health care professional if they continue or are bothersome.):  -muscle pain  -stomach upset, gas  This list may not describe all possible side effects. Call your doctor for medical advice about side effects. You may report side effects to FDA at 1-800-FDA-1088.  Where should I keep my medicine?  This medicine is only given in a clinic, doctor's office, or other health care setting and will not be stored at home.  NOTE: This sheet is a summary. It may not cover all possible information. If you have questions about this medicine, talk to your doctor, pharmacist, or health care provider.      2016, [employer] Standard. (2011-08-18 12:37:47)

## 2016-05-01 NOTE — Telephone Encounter (Signed)
Appointments scheduled per 3/1 LOS.  Patient given AVS report and calendars with future scheduled appointments. °

## 2016-05-21 ENCOUNTER — Other Ambulatory Visit: Payer: Self-pay | Admitting: *Deleted

## 2016-05-21 DIAGNOSIS — C61 Malignant neoplasm of prostate: Secondary | ICD-10-CM

## 2016-05-21 MED ORDER — ABIRATERONE ACETATE 250 MG PO TABS: 1000.0000 mg | ORAL_TABLET | Freq: Every day | ORAL | 0 refills | Status: DC

## 2016-06-04 ENCOUNTER — Other Ambulatory Visit (HOSPITAL_BASED_OUTPATIENT_CLINIC_OR_DEPARTMENT_OTHER): Payer: Medicare Other

## 2016-06-04 ENCOUNTER — Ambulatory Visit (HOSPITAL_BASED_OUTPATIENT_CLINIC_OR_DEPARTMENT_OTHER): Payer: Medicare Other

## 2016-06-04 DIAGNOSIS — Z95828 Presence of other vascular implants and grafts: Secondary | ICD-10-CM

## 2016-06-04 DIAGNOSIS — C61 Malignant neoplasm of prostate: Secondary | ICD-10-CM

## 2016-06-04 LAB — CBC WITH DIFFERENTIAL/PLATELET
BASO%: 0.1 % (ref 0.0–2.0)
BASOS ABS: 0 10*3/uL (ref 0.0–0.1)
EOS ABS: 0.1 10*3/uL (ref 0.0–0.5)
EOS%: 1.5 % (ref 0.0–7.0)
HCT: 39.2 % (ref 38.4–49.9)
HEMOGLOBIN: 13.3 g/dL (ref 13.0–17.1)
LYMPH#: 1.3 10*3/uL (ref 0.9–3.3)
LYMPH%: 18.6 % (ref 14.0–49.0)
MCH: 31.6 pg (ref 27.2–33.4)
MCHC: 33.9 g/dL (ref 32.0–36.0)
MCV: 93.1 fL (ref 79.3–98.0)
MONO#: 0.6 10*3/uL (ref 0.1–0.9)
MONO%: 8.9 % (ref 0.0–14.0)
NEUT#: 4.8 10*3/uL (ref 1.5–6.5)
NEUT%: 70.9 % (ref 39.0–75.0)
Platelets: 212 10*3/uL (ref 140–400)
RBC: 4.21 10*6/uL (ref 4.20–5.82)
RDW: 13.5 % (ref 11.0–14.6)
WBC: 6.7 10*3/uL (ref 4.0–10.3)
nRBC: 0 % (ref 0–0)

## 2016-06-04 LAB — COMPREHENSIVE METABOLIC PANEL
ALBUMIN: 3.9 g/dL (ref 3.5–5.0)
ALK PHOS: 53 U/L (ref 40–150)
ALT: 13 U/L (ref 0–55)
AST: 16 U/L (ref 5–34)
Anion Gap: 9 mEq/L (ref 3–11)
BUN: 15.1 mg/dL (ref 7.0–26.0)
CALCIUM: 9.3 mg/dL (ref 8.4–10.4)
CO2: 24 mEq/L (ref 22–29)
CREATININE: 0.8 mg/dL (ref 0.7–1.3)
Chloride: 104 mEq/L (ref 98–109)
EGFR: 88 mL/min/{1.73_m2} — ABNORMAL LOW (ref >90)
Glucose: 99 mg/dl (ref 70–140)
POTASSIUM: 4.1 meq/L (ref 3.5–5.1)
Sodium: 136 mEq/L (ref 136–145)
Total Bilirubin: 0.38 mg/dL (ref 0.20–1.20)
Total Protein: 6.7 g/dL (ref 6.4–8.3)

## 2016-06-04 MED ORDER — SODIUM CHLORIDE 0.9 % IJ SOLN
10.0000 mL | INTRAMUSCULAR | Status: DC | PRN
Start: 2016-06-04 — End: 2016-06-04
  Administered 2016-06-04: 10 mL via INTRAVENOUS
  Filled 2016-06-04: qty 10

## 2016-06-04 MED ORDER — HEPARIN SOD (PORK) LOCK FLUSH 100 UNIT/ML IV SOLN
500.0000 [IU] | Freq: Once | INTRAVENOUS | Status: AC | PRN
  Administered 2016-06-04: 500 [IU] via INTRAVENOUS
  Filled 2016-06-04: qty 5

## 2016-06-04 NOTE — Patient Instructions (Signed)
Implanted Port Home Guide An implanted port is a type of central line that is placed under the skin. Central lines are used to provide IV access when treatment or nutrition needs to be given through a person's veins. Implanted ports are used for long-term IV access. An implanted port may be placed because:  You need IV medicine that would be irritating to the small veins in your hands or arms.  You need long-term IV medicines, such as antibiotics.  You need IV nutrition for a long period.  You need frequent blood draws for lab tests.  You need dialysis.  Implanted ports are usually placed in the chest area, but they can also be placed in the upper arm, the abdomen, or the leg. An implanted port has two main parts:  Reservoir. The reservoir is round and will appear as a small, raised area under your skin. The reservoir is the part where a needle is inserted to give medicines or draw blood.  Catheter. The catheter is a thin, flexible tube that extends from the reservoir. The catheter is placed into a large vein. Medicine that is inserted into the reservoir goes into the catheter and then into the vein.  How will I care for my incision site? Do not get the incision site wet. Bathe or shower as directed by your health care provider. How is my port accessed? Special steps must be taken to access the port:  Before the port is accessed, a numbing cream can be placed on the skin. This helps numb the skin over the port site.  Your health care provider uses a sterile technique to access the port. ? Your health care provider must put on a mask and sterile gloves. ? The skin over your port is cleaned carefully with an antiseptic and allowed to dry. ? The port is gently pinched between sterile gloves, and a needle is inserted into the port.  Only "non-coring" port needles should be used to access the port. Once the port is accessed, a blood return should be checked. This helps ensure that the port  is in the vein and is not clogged.  If your port needs to remain accessed for a constant infusion, a clear (transparent) bandage will be placed over the needle site. The bandage and needle will need to be changed every week, or as directed by your health care provider.  Keep the bandage covering the needle clean and dry. Do not get it wet. Follow your health care provider's instructions on how to take a shower or bath while the port is accessed.  If your port does not need to stay accessed, no bandage is needed over the port.  What is flushing? Flushing helps keep the port from getting clogged. Follow your health care provider's instructions on how and when to flush the port. Ports are usually flushed with saline solution or a medicine called heparin. The need for flushing will depend on how the port is used.  If the port is used for intermittent medicines or blood draws, the port will need to be flushed: ? After medicines have been given. ? After blood has been drawn. ? As part of routine maintenance.  If a constant infusion is running, the port may not need to be flushed.  How long will my port stay implanted? The port can stay in for as long as your health care provider thinks it is needed. When it is time for the port to come out, surgery will be   done to remove it. The procedure is similar to the one performed when the port was put in. When should I seek immediate medical care? When you have an implanted port, you should seek immediate medical care if:  You notice a bad smell coming from the incision site.  You have swelling, redness, or drainage at the incision site.  You have more swelling or pain at the port site or the surrounding area.  You have a fever that is not controlled with medicine.  This information is not intended to replace advice given to you by your health care provider. Make sure you discuss any questions you have with your health care provider. Document  Released: 02/17/2005 Document Revised: 07/26/2015 Document Reviewed: 10/25/2012 Elsevier Interactive Patient Education  2017 [employer]  

## 2016-06-05 LAB — PSA: PROSTATE SPECIFIC AG, SERUM: 1.5 ng/mL (ref 0.0–4.0)

## 2016-06-06 ENCOUNTER — Telehealth: Payer: Self-pay | Admitting: Oncology

## 2016-06-06 ENCOUNTER — Ambulatory Visit (HOSPITAL_BASED_OUTPATIENT_CLINIC_OR_DEPARTMENT_OTHER): Payer: Medicare Other

## 2016-06-06 ENCOUNTER — Ambulatory Visit (HOSPITAL_BASED_OUTPATIENT_CLINIC_OR_DEPARTMENT_OTHER): Payer: Medicare Other | Admitting: Oncology

## 2016-06-06 ENCOUNTER — Other Ambulatory Visit: Payer: Self-pay | Admitting: *Deleted

## 2016-06-06 VITALS — BP 143/78 | HR 81 | Temp 97.8°F | Resp 18 | Ht 76.0 in | Wt 197.2 lb

## 2016-06-06 DIAGNOSIS — C61 Malignant neoplasm of prostate: Secondary | ICD-10-CM

## 2016-06-06 DIAGNOSIS — I10 Essential (primary) hypertension: Secondary | ICD-10-CM

## 2016-06-06 DIAGNOSIS — C7951 Secondary malignant neoplasm of bone: Secondary | ICD-10-CM | POA: Diagnosis not present

## 2016-06-06 MED ORDER — DENOSUMAB 120 MG/1.7ML ~~LOC~~ SOLN
120.0000 mg | Freq: Once | SUBCUTANEOUS | Status: AC
  Administered 2016-06-06: 120 mg via SUBCUTANEOUS
  Filled 2016-06-06: qty 1.7

## 2016-06-06 MED ORDER — LIDOCAINE-PRILOCAINE 2.5-2.5 % EX CREA: 1.0000 "application " | TOPICAL_CREAM | CUTANEOUS | 2 refills | Status: DC | PRN

## 2016-06-06 NOTE — Telephone Encounter (Signed)
Gave AVS and calender per 06/06/2016 los and inj per MD

## 2016-06-06 NOTE — Patient Instructions (Signed)

## 2016-06-06 NOTE — Progress Notes (Signed)
Hematology and Oncology Follow Up Visit  Philip [name] [id number] [birth year] 71 y.o. 06/06/2016 1:21 PM No PCP Per PatientNo ref. provider found   Principle Diagnosis: 71 year old gentleman with advanced prostate cancer diagnosed in October 2016. He presented with a PSA of 2900 and widespread bone metastasis as well as pelvic adenopathy.    Prior Therapy: Status post orchiectomy done on November 2016. Status post port placement on 02/09/2015. Taxotere chemotherapy at 75 mg/m to start on 02/16/2015. He is status post 6 cycles of therapy last cycle received in March 2017.  Current therapy: Zytiga 1000 mg daily with prednisone at 5 mg daily started on 09/13/2015.  Interim History:  Philip [name] presents today for a follow-up visit. Since the last visit, he reports doing well overall. He does report new right sided chest wall pain predominantly in the lower part of his right rib cage and the right upper quadrant of his abdomen. 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 He denied any falls or syncope. He denied any shortness of. He remains a very active and works part-time. He is able to walk regularly and attends activities of daily living.    He continues on Zytiga with prednisone and have tolerated it well. He reports no complications such as nausea, abdominal pain or fatigue. He denied any excessive fatigue or tiredness. He has no complications related to prednisone except for occasional ecchymosis noted on his skin.  He does not report any headaches, blurry vision, syncope or seizures. He does not report any fevers, chills, sweats or weight loss. He does not report any chest pain, palpitation, orthopnea. Does not report any cough, wheezing, hemoptysis or dyspnea on exertion. He does not report any nausea, vomiting, abdominal pain, hematochezia. He does not report any frequency, urgency or hesitancy. Does not  report any hematuria or dysuria. Does not report any lymphadenopathy or petechiae. Remaining review of systems unremarkable  Medications: I have reviewed the patient's current medications.   Current Outpatient Prescriptions  Medication Sig Dispense Refill  . abiraterone Acetate (ZYTIGA) 250 MG tablet Take 4 tablets (1,000 mg total) by mouth daily. Take on an empty stomach 1 hour before or 2 hours after a meal 120 tablet 0  . BIOTIN 5000 PO Take 1 tablet by mouth daily.    . Calcium Carbonate-Vitamin D3 (CALCIUM 600+D3) 600-400 MG-UNIT TABS Take 1 tablet by mouth daily.    . Denosumab (XGEVA [place]) Inject into the skin.    . fexofenadine (ALLEGRA) 180 MG tablet Take 180 mg by mouth daily.    . fluticasone (FLONASE) 50 MCG/ACT nasal spray Place 1 spray into both nostrils daily as needed for allergies or rhinitis.    . Glycerin-Polysorbate 80 (REFRESH DRY EYE THERAPY OP) Apply 1 drop to eye daily.    . halobetasol (ULTRAVATE) 0.05 % ointment Apply 1 application topically as needed. For exzema.    . hydrOXYzine (ATARAX/VISTARIL) 10 MG tablet Take 10 mg by mouth every 8 (eight) hours.  4  . Multiple Vitamin (MULTIVITAMIN WITH MINERALS) TABS tablet Take 1 tablet by mouth daily.    . ondansetron (ZOFRAN) 4 MG tablet Take 1 tablet (4 mg total) by mouth every 8 (eight) hours as needed for nausea or vomiting. 20 tablet 0  . predniSONE (DELTASONE) 5 MG tablet TAKE 1 TABLET BY MOUTH DAILY WITH BREAKFAST 60 tablet 0  . terbinafine (LAMISIL) 1 % cream Apply 1  application topically 2 (two) times daily.    Marland Kitchen tretinoin (RETIN-A) 0.1 % cream Apply 1 application topically at bedtime.    . lidocaine-prilocaine (EMLA) cream Apply 1 application topically as needed. Apply one teaspoon topically to port 1-2 hours prior to access. 30 g 2   No current facility-administered medications for this visit.    Facility-Administered Medications Ordered in Other Visits  Medication Dose Route Frequency Provider Last Rate Last Dose   . alteplase (CATHFLO ACTIVASE) injection 2 mg  2 mg Intracatheter Once PRN [name], MD      . alteplase (CATHFLO ACTIVASE) injection 2 mg  2 mg Intracatheter Once PRN [name], MD         Allergies: No Known Allergies  Past Medical History, Surgical history, Social history, and Family History were reviewed and updated.   Physical Exam: Blood pressure (!) 143/78, pulse 81, temperature 97.8 F (36.6 C), temperature source Oral, resp. rate 18, height 6\' 4"  (1.93 m), weight 197 lb 3.2 oz (89.4 kg), SpO2 99 %.  ECOG: 0 General appearance: Alert, awake gentleman without distress. Head: Normocephalic, without obvious abnormality No oral thrush or ulcers. Neck: no adenopathy Lymph nodes: Cervical, supraclavicular, and axillary nodes normal. Heart:regular rate and rhythm, S1, S2 normal, no murmur, click, rub or gallop Lung:chest clear, no wheezing, rales, normal symmetric air entry Abdomin: soft, non-tender, without masses or organomegaly no rebound or guarding. EXT: 1+ edema noted unchanged.      Lab Results: Lab Results  Component Value Date   WBC 6.7 06/04/2016   HGB 13.3 06/04/2016   HCT 39.2 06/04/2016   MCV 93.1 06/04/2016   PLT 212 06/04/2016     Chemistry      Component Value Date/Time   NA 136 06/04/2016 0957   K 4.1 06/04/2016 0957   CL 104 02/09/2015 1305   CO2 24 06/04/2016 0957   BUN 15.1 06/04/2016 0957   CREATININE 0.8 06/04/2016 0957      Component Value Date/Time   CALCIUM 9.3 06/04/2016 0957   ALKPHOS 53 06/04/2016 0957   AST 16 06/04/2016 0957   ALT 13 06/04/2016 0957   BILITOT 0.38 06/04/2016 0957      Results for Philip [name] (MRN [id number]) as of 06/06/2016 13:03  Ref. Range 04/01/2016 09:12 04/30/2016 09:55 06/04/2016 09:57  PSA Latest Ref Range: 0.0 - 4.0 ng/mL 1.9 1.6 1.5       Impression and Plan:  71 year old gentleman with the following issues:  1. Advanced prostate cancer diagnosed in October 2016. He presented with PSA  of 2900 and unknown Gleason score. His disease includes widespread bony metastasis as well as pelvic adenopathy. He is status post bilateral orchiectomy done in November 2016.  He is S/P Taxotere at 75 mg/m for a total of 6 cycles that is well-tolerated. His PSA nadir was 5.4.  He started [place] in July 2017 Zytiga and continues to tolerated well. His PSA continues to respond down to 1.5.  The plan is to continue the same dose and schedule given his excellent tolerance and PSA response. We will repeat his staging workup in 3 months including CT scan and a bone scan.  2. IV access: Port-A-Cath  in place without complications. This will be flushed every visit.  3. Hypertension: His blood remains close to normal range at this time.  4. Hormone therapy: He is status post bilateral orchiectomy.   5. Bone directed therapy: Currently on Xgeva without complications. I continue to counsel him about  complications related to that including osteonecrosis of the jaw and hypocalcemia. He is agreeable to continue.  6. Hypokalemia. His potassium is within normal range and will be repeated periodically.  7. Rib cage pain: Could be related due to minor trauma he sustained while he is doing some plumbing work. I instructed him to avoid heavy lifting and use pain medication at this time. No repeat imaging studies is indicated at this time. I do not this is related to cancer progression.  8. Follow-up: He will also be in 4  weeks to monitor his clinical status.    Zola Button, MD 4/6/20181:21 PM

## 2016-06-16 ENCOUNTER — Other Ambulatory Visit: Payer: Self-pay | Admitting: *Deleted

## 2016-06-16 DIAGNOSIS — C61 Malignant neoplasm of prostate: Secondary | ICD-10-CM

## 2016-06-16 MED ORDER — ABIRATERONE ACETATE 250 MG PO TABS: 1000.0000 mg | ORAL_TABLET | Freq: Every day | ORAL | 0 refills | Status: DC

## 2016-06-24 ENCOUNTER — Telehealth: Payer: Self-pay | Admitting: *Deleted

## 2016-06-24 NOTE — Telephone Encounter (Signed)
Patient called wanting to inform MD [facility] and RN that he began to have increased heart rate/heart racing 8 hours after Zytiga was taken. Patient states that this happened once before and it only lasts for about 5 mins.

## 2016-06-24 NOTE — Telephone Encounter (Signed)
Patient calling to say sometimes his heart will race, especially at night when he lies down, and he will take a deep breath hoping it will stop. But today he had some pain with it in the center of his chest. States he did eat a steak and cheese, with onions and peppers on it today. States he has read that zytiga can cause your heart to race. Reviewed signs and symptoms of a heart attack and encourage him to report to the nearest E.D. should he experience anything like this again. Patient verbalized understanding

## 2016-07-07 ENCOUNTER — Ambulatory Visit (HOSPITAL_BASED_OUTPATIENT_CLINIC_OR_DEPARTMENT_OTHER): Payer: Medicare Other

## 2016-07-07 ENCOUNTER — Other Ambulatory Visit: Payer: Medicare Other

## 2016-07-07 VITALS — BP 149/85 | HR 66 | Temp 98.6°F | Resp 18

## 2016-07-07 DIAGNOSIS — Z95828 Presence of other vascular implants and grafts: Secondary | ICD-10-CM

## 2016-07-07 DIAGNOSIS — C61 Malignant neoplasm of prostate: Secondary | ICD-10-CM | POA: Diagnosis not present

## 2016-07-07 LAB — CBC WITH DIFFERENTIAL/PLATELET
BASO%: 0.7 % (ref 0.0–2.0)
Basophils Absolute: 0 10*3/uL (ref 0.0–0.1)
EOS%: 1.8 % (ref 0.0–7.0)
Eosinophils Absolute: 0.1 10*3/uL (ref 0.0–0.5)
HEMATOCRIT: 38.5 % (ref 38.4–49.9)
HEMOGLOBIN: 13 g/dL (ref 13.0–17.1)
LYMPH#: 1.3 10*3/uL (ref 0.9–3.3)
LYMPH%: 20 % (ref 14.0–49.0)
MCH: 31.8 pg (ref 27.2–33.4)
MCHC: 33.9 g/dL (ref 32.0–36.0)
MCV: 93.8 fL (ref 79.3–98.0)
MONO#: 0.5 10*3/uL (ref 0.1–0.9)
MONO%: 7.5 % (ref 0.0–14.0)
NEUT%: 70 % (ref 39.0–75.0)
NEUTROS ABS: 4.4 10*3/uL (ref 1.5–6.5)
Platelets: 211 10*3/uL (ref 140–400)
RBC: 4.1 10*6/uL — ABNORMAL LOW (ref 4.20–5.82)
RDW: 13.2 % (ref 11.0–14.6)
WBC: 6.3 10*3/uL (ref 4.0–10.3)

## 2016-07-07 LAB — COMPREHENSIVE METABOLIC PANEL
ALBUMIN: 3.8 g/dL (ref 3.5–5.0)
ALK PHOS: 48 U/L (ref 40–150)
ALT: 16 U/L (ref 0–55)
AST: 18 U/L (ref 5–34)
Anion Gap: 8 mEq/L (ref 3–11)
BUN: 12.9 mg/dL (ref 7.0–26.0)
CALCIUM: 9.2 mg/dL (ref 8.4–10.4)
CO2: 25 mEq/L (ref 22–29)
CREATININE: 0.8 mg/dL (ref 0.7–1.3)
Chloride: 105 mEq/L (ref 98–109)
EGFR: 88 mL/min/{1.73_m2} — ABNORMAL LOW (ref >90)
Glucose: 98 mg/dl (ref 70–140)
Potassium: 4 mEq/L (ref 3.5–5.1)
Sodium: 137 mEq/L (ref 136–145)
TOTAL PROTEIN: 6.5 g/dL (ref 6.4–8.3)
Total Bilirubin: 0.53 mg/dL (ref 0.20–1.20)

## 2016-07-07 MED ORDER — SODIUM CHLORIDE 0.9 % IJ SOLN
10.0000 mL | INTRAMUSCULAR | Status: DC | PRN
  Administered 2016-07-07: 10 mL via INTRAVENOUS
  Filled 2016-07-07: qty 10

## 2016-07-07 MED ORDER — HEPARIN SOD (PORK) LOCK FLUSH 100 UNIT/ML IV SOLN
500.0000 [IU] | Freq: Once | INTRAVENOUS | Status: AC | PRN
  Administered 2016-07-07: 500 [IU] via INTRAVENOUS
  Filled 2016-07-07: qty 5

## 2016-07-07 NOTE — Patient Instructions (Signed)

## 2016-07-08 ENCOUNTER — Ambulatory Visit: Payer: Medicare Other

## 2016-07-08 ENCOUNTER — Telehealth: Payer: Self-pay | Admitting: Oncology

## 2016-07-08 ENCOUNTER — Ambulatory Visit (HOSPITAL_BASED_OUTPATIENT_CLINIC_OR_DEPARTMENT_OTHER): Payer: Medicare Other | Admitting: Oncology

## 2016-07-08 VITALS — BP 146/74 | HR 89 | Temp 98.3°F | Resp 18 | Ht 76.0 in | Wt 197.6 lb

## 2016-07-08 DIAGNOSIS — C61 Malignant neoplasm of prostate: Secondary | ICD-10-CM

## 2016-07-08 DIAGNOSIS — C775 Secondary and unspecified malignant neoplasm of intrapelvic lymph nodes: Secondary | ICD-10-CM | POA: Diagnosis not present

## 2016-07-08 DIAGNOSIS — E876 Hypokalemia: Secondary | ICD-10-CM | POA: Diagnosis not present

## 2016-07-08 DIAGNOSIS — C7951 Secondary malignant neoplasm of bone: Secondary | ICD-10-CM | POA: Diagnosis not present

## 2016-07-08 DIAGNOSIS — Z95828 Presence of other vascular implants and grafts: Secondary | ICD-10-CM

## 2016-07-08 DIAGNOSIS — N1330 Unspecified hydronephrosis: Secondary | ICD-10-CM | POA: Diagnosis not present

## 2016-07-08 LAB — PSA: PROSTATE SPECIFIC AG, SERUM: 1.4 ng/mL (ref 0.0–4.0)

## 2016-07-08 MED ORDER — DENOSUMAB 120 MG/1.7ML ~~LOC~~ SOLN
120.0000 mg | Freq: Once | SUBCUTANEOUS | Status: AC
  Administered 2016-07-08: 120 mg via SUBCUTANEOUS
  Filled 2016-07-08: qty 1.7

## 2016-07-08 MED ORDER — ALTEPLASE 2 MG IJ SOLR
2.0000 mg | Freq: Once | INTRAMUSCULAR | Status: DC | PRN
  Filled 2016-07-08: qty 2

## 2016-07-08 NOTE — Progress Notes (Signed)
Hematology and Oncology Follow Up Visit  Philip [name] [id number] 07-23-1945 71 y.o. 07/08/2016 1:44 PM Patient, No Pcp PerNo ref. provider found   Principle Diagnosis: 71 year old gentleman with advanced prostate cancer diagnosed in October 2016. He presented with a PSA of 2900 and widespread bone metastasis as well as pelvic adenopathy.    Prior Therapy: Status post orchiectomy done on November 2016. Status post port placement on 02/09/2015. Taxotere chemotherapy at 75 mg/m to start on 02/16/2015. He is status post 6 cycles of therapy last cycle received in March 2017.  Current therapy: Zytiga 1000 mg daily with prednisone at 5 mg daily started on 09/13/2015.  Interim History:  Philip [name] presents today for a follow-up visit. Since the last visit, he reports no major changes. He reports his chest wall pain has resolved at this time and appears to have fully healed. He remains a very active and works part-time. He is able to walk regularly and attends activities of daily living. He denies any recent hospitalizations or illnesses. He denied any bone pain or pathological fractures.   He continues on Zytiga with prednisone and have tolerated it well. He denied any nausea, abdominal pain or fatigue. He denied any excessive fatigue or tiredness. He has no complications related to prednisone except for occasional ecchymosis noted on his skin. He does report periodic anxiety and depression and being emotional at times related to these medications. His symptoms are rather mild and resolves spontaneously.  He does not report any headaches, blurry vision, syncope or seizures. He does not report any fevers, chills, sweats or weight loss. He does not report any chest pain, palpitation, orthopnea. Does not report any cough, wheezing, hemoptysis or dyspnea on exertion. He does not report any nausea, vomiting, abdominal pain, hematochezia. He does not report any frequency, urgency or hesitancy. Does not report any  hematuria or dysuria. Does not report any lymphadenopathy or petechiae. Remaining review of systems unremarkable  Medications: I have reviewed the patient's current medications.   Current Outpatient Prescriptions  Medication Sig Dispense Refill  . abiraterone Acetate (ZYTIGA) 250 MG tablet Take 4 tablets (1,000 mg total) by mouth daily. Take on an empty stomach 1 hour before or 2 hours after a meal 120 tablet 0  . BIOTIN 5000 PO Take 1 tablet by mouth daily.    . Calcium Carbonate-Vitamin D3 (CALCIUM 600+D3) 600-400 MG-UNIT TABS Take 1 tablet by mouth daily.    . Denosumab (XGEVA [place]) Inject into the skin.    . fexofenadine (ALLEGRA) 180 MG tablet Take 180 mg by mouth daily.    . fluticasone (FLONASE) 50 MCG/ACT nasal spray Place 1 spray into both nostrils daily as needed for allergies or rhinitis.    . Glycerin-Polysorbate 80 (REFRESH DRY EYE THERAPY OP) Apply 1 drop to eye daily.    . halobetasol (ULTRAVATE) 0.05 % ointment Apply 1 application topically as needed. For exzema.    . hydrOXYzine (ATARAX/VISTARIL) 10 MG tablet Take 10 mg by mouth every 8 (eight) hours.  4  . lidocaine-prilocaine (EMLA) cream Apply 1 application topically as needed. Apply one teaspoon topically to port 1-2 hours prior to access. 30 g 2  . Multiple Vitamin (MULTIVITAMIN WITH MINERALS) TABS tablet Take 1 tablet by mouth daily.    . ondansetron (ZOFRAN) 4 MG tablet Take 1 tablet (4 mg total) by mouth every 8 (eight) hours as needed for nausea or vomiting. 20 tablet 0  . predniSONE (DELTASONE) 5 MG tablet TAKE 1 TABLET BY MOUTH DAILY  WITH BREAKFAST 60 tablet 0  . terbinafine (LAMISIL) 1 % cream Apply 1 application topically 2 (two) times daily.    Marland Kitchen tretinoin (RETIN-A) 0.1 % cream Apply 1 application topically at bedtime.     No current facility-administered medications for this visit.    Facility-Administered Medications Ordered in Other Visits  Medication Dose Route Frequency Provider Last Rate Last Dose  .  alteplase (CATHFLO ACTIVASE) injection 2 mg  2 mg Intracatheter Once PRN [name], MD      . alteplase (CATHFLO ACTIVASE) injection 2 mg  2 mg Intracatheter Once PRN [name], MD      . alteplase (CATHFLO ACTIVASE) injection 2 mg  2 mg Intracatheter Once PRN [name], MD      . denosumab (XGEVA) injection 120 mg  120 mg Subcutaneous Once Wladyslaw Henrichs, Mathis Dad, MD         Allergies: No Known Allergies  Past Medical History, Surgical history, Social history, and Family History were reviewed and updated.   Physical Exam: Blood pressure (!) 146/74, pulse 89, temperature 98.3 F (36.8 C), temperature source Oral, resp. rate 18, height 6\' 4"  (1.93 m), weight 197 lb 9.6 oz (89.6 kg), SpO2 99 %.  ECOG: 0 General appearance: Well-appearing gentleman appeared comfortable. Head: Normocephalic, without obvious abnormality No oral ulcers or thrush. Neck: no adenopathy Lymph nodes: Cervical, supraclavicular, and axillary nodes normal. Heart:regular rate and rhythm, S1, S2 normal, no murmur, click, rub or gallop Lung:chest clear, no wheezing, rales, normal symmetric air entry Abdomin: soft, non-tender, without masses or organomegaly no shifting dullness or ascites. EXT: 1+ edema appears stable.      Lab Results: Lab Results  Component Value Date   WBC 6.3 07/07/2016   HGB 13.0 07/07/2016   HCT 38.5 07/07/2016   MCV 93.8 07/07/2016   PLT 211 07/07/2016     Chemistry      Component Value Date/Time   NA 137 07/07/2016 0935   K 4.0 07/07/2016 0935   CL 104 02/09/2015 1305   CO2 25 07/07/2016 0935   BUN 12.9 07/07/2016 0935   CREATININE 0.8 07/07/2016 0935      Component Value Date/Time   CALCIUM 9.2 07/07/2016 0935   ALKPHOS 48 07/07/2016 0935   AST 18 07/07/2016 0935   ALT 16 07/07/2016 0935   BILITOT 0.53 07/07/2016 0935       Results for Philip [name], Philip [name] (MRN [id number]) as of 07/08/2016 13:31  Ref. Range 04/30/2016 09:55 06/04/2016 09:57 07/07/2016 09:35  PSA Latest  Ref Range: 0.0 - 4.0 ng/mL 1.6 1.5 1.4       Impression and Plan:  71 year old gentleman with the following issues:  1. Advanced prostate cancer diagnosed in October 2016. He presented with PSA of 2900 and unknown Gleason score. His disease includes widespread bony metastasis as well as pelvic adenopathy. He is status post bilateral orchiectomy done in November 2016.  He is S/P Taxotere at 75 mg/m for a total of 6 cycles that is well-tolerated. His PSA nadir was 5.4.  He started [place] in July 2017 and continues to tolerated well.   He continues to have excellent clinical benefit and PSA response on this treatment. His PSA is 1.4 without any signs of progression of disease. The plan is to continue the same dose and schedule.    2. IV access: Port-A-Cath  in place without complications. This will be flushed every visit.  3. Hypertension: His blood within normal range.  4. Hormone therapy: He  is status post bilateral orchiectomy.   5. Bone directed therapy: Currently on Xgeva without complications. I continue to counsel him about complications related to that including osteonecrosis of the jaw and hypocalcemia. No changes noted at this time.  6. Hypokalemia. His potassium is within normal range and will be repeated periodically.  7. Rib cage pain: Resolved at this time.  8. Follow-up: He will also be in 4  weeks to monitor his clinical status.    Zola Button, MD 5/8/20181:44 PM

## 2016-07-08 NOTE — Telephone Encounter (Signed)
Gave patient AVS and calender per 5/8 los.  

## 2016-07-08 NOTE — Patient Instructions (Signed)
Denosumab injection  What is this medicine?  DENOSUMAB (den oh sue mab) slows bone breakdown. Prolia is used to treat osteoporosis in women after menopause and in men. Xgeva is used to prevent bone fractures and other bone problems caused by cancer bone metastases. Xgeva is also used to treat giant cell tumor of the bone.  This medicine may be used for other purposes; ask your health care provider or pharmacist if you have questions.  What should I tell my health care provider before I take this medicine?  They need to know if you have any of these conditions:  -dental disease  -eczema  -infection or history of infections  -kidney disease or on dialysis  -low blood calcium or vitamin D  -malabsorption syndrome  -scheduled to have surgery or tooth extraction  -taking medicine that contains denosumab  -thyroid or parathyroid disease  -an unusual reaction to denosumab, other medicines, foods, dyes, or preservatives  -pregnant or trying to get pregnant  -breast-feeding  How should I use this medicine?  This medicine is for injection under the skin. It is given by a health care professional in a hospital or clinic setting.  If you are getting Prolia, a special MedGuide will be given to you by the pharmacist with each prescription and refill. Be sure to read this information carefully each time.  For Prolia, talk to your pediatrician regarding the use of this medicine in children. Special care may be needed. For Xgeva, talk to your pediatrician regarding the use of this medicine in children. While this drug may be prescribed for children as young as 13 years for selected conditions, precautions do apply.  Overdosage: If you think you have taken too much of this medicine contact a poison control center or emergency room at once.  NOTE: This medicine is only for you. Do not share this medicine with others.  What if I miss a dose?  It is important not to miss your dose. Call your doctor or health care professional if you are  unable to keep an appointment.  What may interact with this medicine?  Do not take this medicine with any of the following medications:  -other medicines containing denosumab  This medicine may also interact with the following medications:  -medicines that suppress the immune system  -medicines that treat cancer  -steroid medicines like prednisone or cortisone  This list may not describe all possible interactions. Give your health care provider a list of all the medicines, herbs, non-prescription drugs, or dietary supplements you use. Also tell them if you smoke, drink alcohol, or use illegal drugs. Some items may interact with your medicine.  What should I watch for while using this medicine?  Visit your doctor or health care professional for regular checks on your progress. Your doctor or health care professional may order blood tests and other tests to see how you are doing.  Call your doctor or health care professional if you get a cold or other infection while receiving this medicine. Do not treat yourself. This medicine may decrease your body's ability to fight infection.  You should make sure you get enough calcium and vitamin D while you are taking this medicine, unless your doctor tells you not to. Discuss the foods you eat and the vitamins you take with your health care professional.  See your dentist regularly. Brush and floss your teeth as directed. Before you have any dental work done, tell your dentist you are receiving this medicine.  Do   not become pregnant while taking this medicine or for 5 months after stopping it. Women should inform their doctor if they wish to become pregnant or think they might be pregnant. There is a potential for serious side effects to an unborn child. Talk to your health care professional or pharmacist for more information.  What side effects may I notice from receiving this medicine?  Side effects that you should report to your doctor or health care professional as soon as  possible:  -allergic reactions like skin rash, itching or hives, swelling of the face, lips, or tongue  -breathing problems  -chest pain  -fast, irregular heartbeat  -feeling faint or lightheaded, falls  -fever, chills, or any other sign of infection  -muscle spasms, tightening, or twitches  -numbness or tingling  -skin blisters or bumps, or is dry, peels, or red  -slow healing or unexplained pain in the mouth or jaw  -unusual bleeding or bruising  Side effects that usually do not require medical attention (Report these to your doctor or health care professional if they continue or are bothersome.):  -muscle pain  -stomach upset, gas  This list may not describe all possible side effects. Call your doctor for medical advice about side effects. You may report side effects to FDA at 1-800-FDA-1088.  Where should I keep my medicine?  This medicine is only given in a clinic, doctor's office, or other health care setting and will not be stored at home.  NOTE: This sheet is a summary. It may not cover all possible information. If you have questions about this medicine, talk to your doctor, pharmacist, or health care provider.      2016, [employer] Standard. (2011-08-18 12:37:47)

## 2016-07-14 ENCOUNTER — Encounter: Payer: Self-pay | Admitting: *Deleted

## 2016-07-15 ENCOUNTER — Telehealth: Payer: Self-pay | Admitting: Pharmacist

## 2016-07-15 NOTE — Telephone Encounter (Signed)
Oral Chemotherapy Pharmacist Encounter  Received call from patient today with questions about [place] ([employer]) application to try to obtain Zytiga at $0 out of pocket cost from the manufacturer.  Patient will bring in re-enrollment form and proof of income documentation to [facility] tomorrow morning. Oral Oncology Clinic will fax completed application to [employer] and follow until final determination.  Patient understands and is in agreement with above plan.  Johny Drilling, PharmD, BCPS, BCOP 07/15/2016  10:24 AM Oral Oncology Clinic [phone]

## 2016-07-16 ENCOUNTER — Other Ambulatory Visit: Payer: Self-pay | Admitting: *Deleted

## 2016-07-16 DIAGNOSIS — C61 Malignant neoplasm of prostate: Secondary | ICD-10-CM

## 2016-07-16 MED ORDER — ABIRATERONE ACETATE 250 MG PO TABS: 1000.0000 mg | ORAL_TABLET | Freq: Every day | ORAL | 0 refills | Status: DC

## 2016-07-16 NOTE — Telephone Encounter (Signed)
Oral Chemotherapy Pharmacist Encounter  I met in [facility] lobby this morning to compete [employer] application. Completed application faxed to [employer] at [phone]  This encounter will continue to be updated until final determination.  Oral Oncology Clinic will continue to follow.   Johny Drilling, PharmD, BCPS, BCOP 07/16/2016  10:26 AM Oral Oncology Clinic [phone]

## 2016-08-13 ENCOUNTER — Ambulatory Visit (HOSPITAL_BASED_OUTPATIENT_CLINIC_OR_DEPARTMENT_OTHER): Payer: Medicare Other

## 2016-08-13 ENCOUNTER — Other Ambulatory Visit (HOSPITAL_BASED_OUTPATIENT_CLINIC_OR_DEPARTMENT_OTHER): Payer: Medicare Other

## 2016-08-13 DIAGNOSIS — C61 Malignant neoplasm of prostate: Secondary | ICD-10-CM | POA: Diagnosis not present

## 2016-08-13 DIAGNOSIS — Z95828 Presence of other vascular implants and grafts: Secondary | ICD-10-CM

## 2016-08-13 LAB — COMPREHENSIVE METABOLIC PANEL
ALK PHOS: 49 U/L (ref 40–150)
ALT: 14 U/L (ref 0–55)
AST: 16 U/L (ref 5–34)
Albumin: 3.7 g/dL (ref 3.5–5.0)
Anion Gap: 7 mEq/L (ref 3–11)
BUN: 14.1 mg/dL (ref 7.0–26.0)
CO2: 25 meq/L (ref 22–29)
Calcium: 8.9 mg/dL (ref 8.4–10.4)
Chloride: 106 mEq/L (ref 98–109)
Creatinine: 0.9 mg/dL (ref 0.7–1.3)
EGFR: 87 mL/min/{1.73_m2} — AB (ref >90)
GLUCOSE: 96 mg/dL (ref 70–140)
POTASSIUM: 4 meq/L (ref 3.5–5.1)
SODIUM: 138 meq/L (ref 136–145)
Total Bilirubin: 0.56 mg/dL (ref 0.20–1.20)
Total Protein: 6.4 g/dL (ref 6.4–8.3)

## 2016-08-13 LAB — CBC WITH DIFFERENTIAL/PLATELET
BASO%: 0.2 % (ref 0.0–2.0)
BASOS ABS: 0 10*3/uL (ref 0.0–0.1)
EOS ABS: 0.1 10*3/uL (ref 0.0–0.5)
EOS%: 1.9 % (ref 0.0–7.0)
HCT: 38.6 % (ref 38.4–49.9)
HGB: 12.4 g/dL — ABNORMAL LOW (ref 13.0–17.1)
LYMPH%: 22.8 % (ref 14.0–49.0)
MCH: 30.8 pg (ref 27.2–33.4)
MCHC: 32.1 g/dL (ref 32.0–36.0)
MCV: 95.8 fL (ref 79.3–98.0)
MONO#: 0.5 10*3/uL (ref 0.1–0.9)
MONO%: 9.4 % (ref 0.0–14.0)
NEUT#: 3.4 10*3/uL (ref 1.5–6.5)
NEUT%: 65.7 % (ref 39.0–75.0)
Platelets: 210 10*3/uL (ref 140–400)
RBC: 4.03 10*6/uL — AB (ref 4.20–5.82)
RDW: 13.5 % (ref 11.0–14.6)
WBC: 5.2 10*3/uL (ref 4.0–10.3)
lymph#: 1.2 10*3/uL (ref 0.9–3.3)

## 2016-08-13 MED ORDER — SODIUM CHLORIDE 0.9 % IJ SOLN
10.0000 mL | INTRAMUSCULAR | Status: DC | PRN
  Administered 2016-08-13: 10 mL via INTRAVENOUS
  Filled 2016-08-13: qty 10

## 2016-08-13 MED ORDER — HEPARIN SOD (PORK) LOCK FLUSH 100 UNIT/ML IV SOLN
500.0000 [IU] | Freq: Once | INTRAVENOUS | Status: AC | PRN
  Administered 2016-08-13: 500 [IU] via INTRAVENOUS
  Filled 2016-08-13: qty 5

## 2016-08-13 NOTE — Patient Instructions (Signed)

## 2016-08-14 ENCOUNTER — Ambulatory Visit (HOSPITAL_BASED_OUTPATIENT_CLINIC_OR_DEPARTMENT_OTHER): Payer: Medicare Other

## 2016-08-14 ENCOUNTER — Ambulatory Visit (HOSPITAL_BASED_OUTPATIENT_CLINIC_OR_DEPARTMENT_OTHER): Payer: Medicare Other | Admitting: Oncology

## 2016-08-14 ENCOUNTER — Telehealth: Payer: Self-pay | Admitting: Oncology

## 2016-08-14 VITALS — BP 153/70 | HR 82 | Temp 98.0°F | Resp 17 | Ht 76.0 in | Wt 195.8 lb

## 2016-08-14 DIAGNOSIS — C61 Malignant neoplasm of prostate: Secondary | ICD-10-CM | POA: Diagnosis not present

## 2016-08-14 DIAGNOSIS — I10 Essential (primary) hypertension: Secondary | ICD-10-CM

## 2016-08-14 DIAGNOSIS — Z95828 Presence of other vascular implants and grafts: Secondary | ICD-10-CM

## 2016-08-14 DIAGNOSIS — E876 Hypokalemia: Secondary | ICD-10-CM

## 2016-08-14 DIAGNOSIS — C7951 Secondary malignant neoplasm of bone: Secondary | ICD-10-CM | POA: Diagnosis not present

## 2016-08-14 LAB — PSA: Prostate Specific Ag, Serum: 1.3 ng/mL (ref 0.0–4.0)

## 2016-08-14 MED ORDER — DENOSUMAB 120 MG/1.7ML ~~LOC~~ SOLN
120.0000 mg | Freq: Once | SUBCUTANEOUS | Status: AC
  Administered 2016-08-14: 120 mg via SUBCUTANEOUS
  Filled 2016-08-14: qty 1.7

## 2016-08-14 NOTE — Telephone Encounter (Signed)
Scheduled appt per 6/14 los - Gave patient AVS and calender per LOS.  

## 2016-08-14 NOTE — Patient Instructions (Signed)
Denosumab injection  What is this medicine?  DENOSUMAB (den oh sue mab) slows bone breakdown. Prolia is used to treat osteoporosis in women after menopause and in men. Xgeva is used to prevent bone fractures and other bone problems caused by cancer bone metastases. Xgeva is also used to treat giant cell tumor of the bone.  This medicine may be used for other purposes; ask your health care provider or pharmacist if you have questions.  What should I tell my health care provider before I take this medicine?  They need to know if you have any of these conditions:  -dental disease  -eczema  -infection or history of infections  -kidney disease or on dialysis  -low blood calcium or vitamin D  -malabsorption syndrome  -scheduled to have surgery or tooth extraction  -taking medicine that contains denosumab  -thyroid or parathyroid disease  -an unusual reaction to denosumab, other medicines, foods, dyes, or preservatives  -pregnant or trying to get pregnant  -breast-feeding  How should I use this medicine?  This medicine is for injection under the skin. It is given by a health care professional in a hospital or clinic setting.  If you are getting Prolia, a special MedGuide will be given to you by the pharmacist with each prescription and refill. Be sure to read this information carefully each time.  For Prolia, talk to your pediatrician regarding the use of this medicine in children. Special care may be needed. For Xgeva, talk to your pediatrician regarding the use of this medicine in children. While this drug may be prescribed for children as young as 13 years for selected conditions, precautions do apply.  Overdosage: If you think you have taken too much of this medicine contact a poison control center or emergency room at once.  NOTE: This medicine is only for you. Do not share this medicine with others.  What if I miss a dose?  It is important not to miss your dose. Call your doctor or health care professional if you are  unable to keep an appointment.  What may interact with this medicine?  Do not take this medicine with any of the following medications:  -other medicines containing denosumab  This medicine may also interact with the following medications:  -medicines that suppress the immune system  -medicines that treat cancer  -steroid medicines like prednisone or cortisone  This list may not describe all possible interactions. Give your health care provider a list of all the medicines, herbs, non-prescription drugs, or dietary supplements you use. Also tell them if you smoke, drink alcohol, or use illegal drugs. Some items may interact with your medicine.  What should I watch for while using this medicine?  Visit your doctor or health care professional for regular checks on your progress. Your doctor or health care professional may order blood tests and other tests to see how you are doing.  Call your doctor or health care professional if you get a cold or other infection while receiving this medicine. Do not treat yourself. This medicine may decrease your body's ability to fight infection.  You should make sure you get enough calcium and vitamin D while you are taking this medicine, unless your doctor tells you not to. Discuss the foods you eat and the vitamins you take with your health care professional.  See your dentist regularly. Brush and floss your teeth as directed. Before you have any dental work done, tell your dentist you are receiving this medicine.  Do   not become pregnant while taking this medicine or for 5 months after stopping it. Women should inform their doctor if they wish to become pregnant or think they might be pregnant. There is a potential for serious side effects to an unborn child. Talk to your health care professional or pharmacist for more information.  What side effects may I notice from receiving this medicine?  Side effects that you should report to your doctor or health care professional as soon as  possible:  -allergic reactions like skin rash, itching or hives, swelling of the face, lips, or tongue  -breathing problems  -chest pain  -fast, irregular heartbeat  -feeling faint or lightheaded, falls  -fever, chills, or any other sign of infection  -muscle spasms, tightening, or twitches  -numbness or tingling  -skin blisters or bumps, or is dry, peels, or red  -slow healing or unexplained pain in the mouth or jaw  -unusual bleeding or bruising  Side effects that usually do not require medical attention (Report these to your doctor or health care professional if they continue or are bothersome.):  -muscle pain  -stomach upset, gas  This list may not describe all possible side effects. Call your doctor for medical advice about side effects. You may report side effects to FDA at 1-800-FDA-1088.  Where should I keep my medicine?  This medicine is only given in a clinic, doctor's office, or other health care setting and will not be stored at home.  NOTE: This sheet is a summary. It may not cover all possible information. If you have questions about this medicine, talk to your doctor, pharmacist, or health care provider.      2016, [employer] Standard. (2011-08-18 12:37:47)

## 2016-08-14 NOTE — Progress Notes (Signed)
Hematology and Oncology Follow Up Visit  Philip [name] [id number] 10/26/1945 71 y.o. 08/14/2016 3:07 PM Patient, No Pcp PerNo ref. provider found   Principle Diagnosis: 71 year old gentleman with advanced prostate cancer diagnosed in October 2016. He presented with a PSA of 2900 and widespread bone metastasis as well as pelvic adenopathy.    Prior Therapy: Status post orchiectomy done on November 2016. Status post port placement on 02/09/2015. Taxotere chemotherapy at 75 mg/m to start on 02/16/2015. He is status post 6 cycles of therapy last cycle received in March 2017.  Current therapy: Zytiga 1000 mg daily with prednisone at 5 mg daily started on 09/13/2015.  Interim History:  Philip [name] presents today for a follow-up visit. Since the last visit, he reports feeling well without any complaints. He remains a very active and works part-time. He is able to walk regularly and attends activities of daily living. He denies any recent hospitalizations or illnesses. He denied any bone pain or pathological fractures. His rib cage pain and shoulder discomfort has completely resolved at this time.   He continues on Zytiga with prednisone and denied any nausea, abdominal pain or fatigue. He denied any excessive fatigue or tiredness. He has no complications related to prednisone except for occasional ecchymosis noted on his skin. He has no difficulties obtaining this medication or taking it on a regular basis.  He does not report any headaches, blurry vision, syncope or seizures. He does not report any fevers, chills, sweats or weight loss. He does not report any chest pain, palpitation, orthopnea. Does not report any cough, wheezing, hemoptysis or dyspnea on exertion. He does not report any nausea, vomiting, abdominal pain, hematochezia. He does not report any frequency, urgency or hesitancy. Does not report any hematuria or dysuria. Does not report any lymphadenopathy or petechiae. Remaining review of systems  unremarkable  Medications: I have reviewed the patient's current medications.   Current Outpatient Prescriptions  Medication Sig Dispense Refill  . abiraterone Acetate (ZYTIGA) 250 MG tablet Take 4 tablets (1,000 mg total) by mouth daily. Take on an empty stomach 1 hour before or 2 hours after a meal 120 tablet 0  . BIOTIN 5000 PO Take 1 tablet by mouth daily.    . Calcium Carbonate-Vitamin D3 (CALCIUM 600+D3) 600-400 MG-UNIT TABS Take 1 tablet by mouth daily.    . Denosumab (XGEVA [place]) Inject into the skin.    . fexofenadine (ALLEGRA) 180 MG tablet Take 180 mg by mouth daily.    . fluticasone (FLONASE) 50 MCG/ACT nasal spray Place 1 spray into both nostrils daily as needed for allergies or rhinitis.    . Glycerin-Polysorbate 80 (REFRESH DRY EYE THERAPY OP) Apply 1 drop to eye daily.    . halobetasol (ULTRAVATE) 0.05 % ointment Apply 1 application topically as needed. For exzema.    . hydrOXYzine (ATARAX/VISTARIL) 10 MG tablet Take 10 mg by mouth every 8 (eight) hours.  4  . lidocaine-prilocaine (EMLA) cream Apply 1 application topically as needed. Apply one teaspoon topically to port 1-2 hours prior to access. 30 g 2  . Multiple Vitamin (MULTIVITAMIN WITH MINERALS) TABS tablet Take 1 tablet by mouth daily.    . ondansetron (ZOFRAN) 4 MG tablet Take 1 tablet (4 mg total) by mouth every 8 (eight) hours as needed for nausea or vomiting. 20 tablet 0  . predniSONE (DELTASONE) 5 MG tablet TAKE 1 TABLET BY MOUTH DAILY WITH BREAKFAST 60 tablet 0  . terbinafine (LAMISIL) 1 % cream Apply 1 application topically 2 (  two) times daily.    Marland Kitchen tretinoin (RETIN-A) 0.1 % cream Apply 1 application topically at bedtime.     No current facility-administered medications for this visit.    Facility-Administered Medications Ordered in Other Visits  Medication Dose Route Frequency Provider Last Rate Last Dose  . alteplase (CATHFLO ACTIVASE) injection 2 mg  2 mg Intracatheter Once PRN [name], MD      .  alteplase (CATHFLO ACTIVASE) injection 2 mg  2 mg Intracatheter Once PRN [name], MD      . alteplase (CATHFLO ACTIVASE) injection 2 mg  2 mg Intracatheter Once PRN [name], MD      . denosumab (XGEVA) injection 120 mg  120 mg Subcutaneous Once Kendrick Remigio, Mathis Dad, MD         Allergies: No Known Allergies  Past Medical History, Surgical history, Social history, and Family History were reviewed and updated.   Physical Exam: Blood pressure (!) 153/70, pulse 82, temperature 98 F (36.7 C), temperature source Oral, resp. rate 17, height 6\' 4"  (1.93 m), weight 195 lb 12.8 oz (88.8 kg), SpO2 99 %.  ECOG: 0 General appearance: Alert, awake gentleman without distress. Head: Normocephalic, without obvious abnormality No oral thrush or ulcers. Neck: no adenopathy Lymph nodes: Cervical, supraclavicular, and axillary nodes normal. Heart:regular rate and rhythm, S1, S2 normal, no murmur, click, rub or gallop Lung:chest clear, no wheezing, rales, normal symmetric air entry Abdomin: soft, non-tender, without masses or organomegaly no rebound or guarding. EXT: 1+ edema which is unchanged.      Lab Results: Lab Results  Component Value Date   WBC 5.2 08/13/2016   HGB 12.4 (L) 08/13/2016   HCT 38.6 08/13/2016   MCV 95.8 08/13/2016   PLT 210 08/13/2016     Chemistry      Component Value Date/Time   NA 138 08/13/2016 0915   K 4.0 08/13/2016 0915   CL 104 02/09/2015 1305   CO2 25 08/13/2016 0915   BUN 14.1 08/13/2016 0915   CREATININE 0.9 08/13/2016 0915      Component Value Date/Time   CALCIUM 8.9 08/13/2016 0915   ALKPHOS 49 08/13/2016 0915   AST 16 08/13/2016 0915   ALT 14 08/13/2016 0915   BILITOT 0.56 08/13/2016 0915     Results for Philip [name] (MRN [id number]) as of 08/14/2016 14:52  Ref. Range 07/07/2016 09:35 08/13/2016 09:16  PSA Latest Ref Range: 0.0 - 4.0 ng/mL 1.4 1.3      Impression and Plan:  71 year old gentleman with the following issues:  1.  Advanced prostate cancer diagnosed in October 2016. He presented with PSA of 2900 and unknown Gleason score. His disease includes widespread bony metastasis as well as pelvic adenopathy. He is status post bilateral orchiectomy done in November 2016.  He is S/P Taxotere at 75 mg/m for a total of 6 cycles that is well-tolerated. His PSA nadir was 5.4.  He started [place] in July 2017 and has not reported any complications since last visit.  His PSA remains under excellent control at 1.3. Risks and benefits of continuing this medication were reviewed again is agreeable to continue.    2. IV access: Port-A-Cath  in place without complications. This will be flushed every visit.  3. Hypertension: His blood pressure continues to be within normal range.  4. Hormone therapy: He is status post bilateral orchiectomy.   5. Bone directed therapy: Currently on Xgeva without complications. I continue to counsel him about complications related to that  including osteonecrosis of the jaw and hypocalcemia. He agrees to continue for the time being.  6. Hypokalemia. His potassium remains within normal range.  7. Rib cage pain: Resolved at this time.  8. Follow-up: He will also be in 4  weeks to monitor his clinical status.    [facility], MD 6/14/20183:07 PM

## 2016-08-19 ENCOUNTER — Other Ambulatory Visit: Payer: Self-pay | Admitting: *Deleted

## 2016-08-19 DIAGNOSIS — C61 Malignant neoplasm of prostate: Secondary | ICD-10-CM

## 2016-08-19 MED ORDER — ABIRATERONE ACETATE 250 MG PO TABS: 1000.0000 mg | ORAL_TABLET | Freq: Every day | ORAL | 0 refills | Status: DC

## 2016-08-29 NOTE — Telephone Encounter (Signed)
Oral Oncology Patient Advocate Encounter  Received notification from [facility] and [facility] Patient Assistance program that patient has been successfully re-enrolled into their program to receive Zytiga at $0 until 08-19-2017.  I called and left a message for the patient.   Patient advised to call the office with questions or concerns.  [name], [name], [name] Patient [facility] [phone] 08/29/2016 10:48 AM

## 2016-09-11 ENCOUNTER — Telehealth: Payer: Self-pay | Admitting: *Deleted

## 2016-09-11 NOTE — Telephone Encounter (Signed)
Returning patient's phone call. Left message for him to call me back at [phone].

## 2016-09-12 NOTE — Telephone Encounter (Signed)
This RN called patient again to review his questions about Zytiga. Left message for him to call me back at [phone].

## 2016-09-17 ENCOUNTER — Other Ambulatory Visit (HOSPITAL_BASED_OUTPATIENT_CLINIC_OR_DEPARTMENT_OTHER): Payer: Medicare Other

## 2016-09-17 ENCOUNTER — Ambulatory Visit: Payer: Medicare Other

## 2016-09-17 ENCOUNTER — Other Ambulatory Visit: Payer: Self-pay | Admitting: *Deleted

## 2016-09-17 DIAGNOSIS — C61 Malignant neoplasm of prostate: Secondary | ICD-10-CM

## 2016-09-17 DIAGNOSIS — C7951 Secondary malignant neoplasm of bone: Secondary | ICD-10-CM

## 2016-09-17 DIAGNOSIS — Z95828 Presence of other vascular implants and grafts: Secondary | ICD-10-CM

## 2016-09-17 LAB — COMPREHENSIVE METABOLIC PANEL
ALT: 13 U/L (ref 0–55)
ANION GAP: 9 meq/L (ref 3–11)
AST: 16 U/L (ref 5–34)
Albumin: 3.8 g/dL (ref 3.5–5.0)
Alkaline Phosphatase: 50 U/L (ref 40–150)
BUN: 15.8 mg/dL (ref 7.0–26.0)
CHLORIDE: 101 meq/L (ref 98–109)
CO2: 23 meq/L (ref 22–29)
Calcium: 9.2 mg/dL (ref 8.4–10.4)
Creatinine: 0.9 mg/dL (ref 0.7–1.3)
EGFR: 87 mL/min/{1.73_m2} — AB (ref >90)
GLUCOSE: 95 mg/dL (ref 70–140)
POTASSIUM: 4.2 meq/L (ref 3.5–5.1)
SODIUM: 133 meq/L — AB (ref 136–145)
Total Bilirubin: 0.49 mg/dL (ref 0.20–1.20)
Total Protein: 6.7 g/dL (ref 6.4–8.3)

## 2016-09-17 LAB — CBC WITH DIFFERENTIAL/PLATELET
BASO%: 0.1 % (ref 0.0–2.0)
Basophils Absolute: 0 10*3/uL (ref 0.0–0.1)
EOS ABS: 0 10*3/uL (ref 0.0–0.5)
EOS%: 0.6 % (ref 0.0–7.0)
HCT: 39.6 % (ref 38.4–49.9)
HGB: 12.9 g/dL — ABNORMAL LOW (ref 13.0–17.1)
LYMPH%: 18.9 % (ref 14.0–49.0)
MCH: 31.1 pg (ref 27.2–33.4)
MCHC: 32.6 g/dL (ref 32.0–36.0)
MCV: 95.4 fL (ref 79.3–98.0)
MONO#: 0.6 10*3/uL (ref 0.1–0.9)
MONO%: 8.4 % (ref 0.0–14.0)
NEUT#: 4.9 10*3/uL (ref 1.5–6.5)
NEUT%: 72 % (ref 39.0–75.0)
PLATELETS: 215 10*3/uL (ref 140–400)
RBC: 4.15 10*6/uL — ABNORMAL LOW (ref 4.20–5.82)
RDW: 13.5 % (ref 11.0–14.6)
WBC: 6.8 10*3/uL (ref 4.0–10.3)
lymph#: 1.3 10*3/uL (ref 0.9–3.3)

## 2016-09-17 MED ORDER — HEPARIN SOD (PORK) LOCK FLUSH 100 UNIT/ML IV SOLN
500.0000 [IU] | Freq: Once | INTRAVENOUS | Status: AC | PRN
  Administered 2016-09-17: 500 [IU] via INTRAVENOUS
  Filled 2016-09-17: qty 5

## 2016-09-17 MED ORDER — ABIRATERONE ACETATE 250 MG PO TABS: 1000.0000 mg | ORAL_TABLET | Freq: Every day | ORAL | 0 refills | Status: DC

## 2016-09-17 MED ORDER — SODIUM CHLORIDE 0.9 % IJ SOLN
10.0000 mL | INTRAMUSCULAR | Status: DC | PRN
  Administered 2016-09-17: 10 mL via INTRAVENOUS
  Filled 2016-09-17: qty 10

## 2016-09-17 NOTE — Patient Instructions (Signed)

## 2016-09-18 LAB — PSA: PROSTATE SPECIFIC AG, SERUM: 1.2 ng/mL (ref 0.0–4.0)

## 2016-09-19 ENCOUNTER — Ambulatory Visit: Payer: Medicare Other

## 2016-09-19 ENCOUNTER — Telehealth: Payer: Self-pay | Admitting: Oncology

## 2016-09-19 ENCOUNTER — Encounter (HOSPITAL_BASED_OUTPATIENT_CLINIC_OR_DEPARTMENT_OTHER): Payer: Medicare Other

## 2016-09-19 ENCOUNTER — Ambulatory Visit (HOSPITAL_BASED_OUTPATIENT_CLINIC_OR_DEPARTMENT_OTHER): Payer: Medicare Other | Admitting: Oncology

## 2016-09-19 VITALS — BP 155/86 | HR 74 | Temp 98.6°F | Resp 18 | Ht 76.0 in | Wt 196.7 lb

## 2016-09-19 DIAGNOSIS — E876 Hypokalemia: Secondary | ICD-10-CM

## 2016-09-19 DIAGNOSIS — C61 Malignant neoplasm of prostate: Secondary | ICD-10-CM

## 2016-09-19 DIAGNOSIS — C7951 Secondary malignant neoplasm of bone: Secondary | ICD-10-CM

## 2016-09-19 MED ORDER — DENOSUMAB 120 MG/1.7ML ~~LOC~~ SOLN
120.0000 mg | Freq: Once | SUBCUTANEOUS | Status: AC
  Administered 2016-09-19: 120 mg via SUBCUTANEOUS
  Filled 2016-09-19: qty 1.7

## 2016-09-19 NOTE — Progress Notes (Signed)
Hematology and Oncology Follow Up Visit  Philip [name] [id number] September 06, 1945 71 y.o. 09/19/2016 3:37 PM Patient, No Pcp PerNo ref. provider found   Principle Diagnosis: 71 year old gentleman with advanced prostate cancer diagnosed in October 2016. He presented with a PSA of 2900 and widespread bone metastasis as well as pelvic adenopathy.    Prior Therapy: Status post orchiectomy done on November 2016. Status post port placement on 02/09/2015. Taxotere chemotherapy at 75 mg/m to start on 02/16/2015. He is status post 6 cycles of therapy last cycle received in March 2017.  Current therapy: Zytiga 1000 mg daily with prednisone at 5 mg daily started on 09/13/2015.  Interim History:  Philip [name] presents today for a follow-up visit. Since the last visit, he reports doing reasonably well. He reports that he was on vacation with his family and did have a few episodes of emotional outbursts that is inconsistent with his baseline personality. He has reported some occasional emotional instability and sometimes spontaneous crying. Otherwise see does not report any physical complaints. He remains a very active and works part-time. He is able to walk regularly and attends activities of daily living. He denies any recent hospitalizations or illnesses. He denied any bone pain or pathological fractures.    He continues on Zytiga with prednisone and denied any nausea, abdominal pain or fatigue. He denied any excessive fatigue or tiredness. He has no difficulties obtaining this medication or taking it on a regular basis.  He does not report any headaches, blurry vision, syncope or seizures. He does not report any fevers, chills, sweats or weight loss. He does not report any chest pain, palpitation, orthopnea. Does not report any cough, wheezing, hemoptysis or dyspnea on exertion. He does not report any nausea, vomiting, abdominal pain, hematochezia. He does not report any frequency, urgency or hesitancy. Does not  report any hematuria or dysuria. Does not report any lymphadenopathy or petechiae. Remaining review of systems unremarkable  Medications: I have reviewed the patient's current medications.   Current Outpatient Prescriptions  Medication Sig Dispense Refill  . abiraterone Acetate (ZYTIGA) 250 MG tablet Take 4 tablets (1,000 mg total) by mouth daily. Take on an empty stomach 1 hour before or 2 hours after a meal 120 tablet 0  . BIOTIN 5000 PO Take 1 tablet by mouth daily.    . Calcium Carbonate-Vitamin D3 (CALCIUM 600+D3) 600-400 MG-UNIT TABS Take 1 tablet by mouth daily.    . Denosumab (XGEVA [place]) Inject into the skin.    . fexofenadine (ALLEGRA) 180 MG tablet Take 180 mg by mouth daily.    . fluticasone (FLONASE) 50 MCG/ACT nasal spray Place 1 spray into both nostrils daily as needed for allergies or rhinitis.    . Glycerin-Polysorbate 80 (REFRESH DRY EYE THERAPY OP) Apply 1 drop to eye daily.    . halobetasol (ULTRAVATE) 0.05 % ointment Apply 1 application topically as needed. For exzema.    . hydrOXYzine (ATARAX/VISTARIL) 10 MG tablet Take 10 mg by mouth every 8 (eight) hours.  4  . lidocaine-prilocaine (EMLA) cream Apply 1 application topically as needed. Apply one teaspoon topically to port 1-2 hours prior to access. 30 g 2  . Multiple Vitamin (MULTIVITAMIN WITH MINERALS) TABS tablet Take 1 tablet by mouth daily.    . ondansetron (ZOFRAN) 4 MG tablet Take 1 tablet (4 mg total) by mouth every 8 (eight) hours as needed for nausea or vomiting. 20 tablet 0  . predniSONE (DELTASONE) 5 MG tablet TAKE 1 TABLET BY MOUTH DAILY WITH  BREAKFAST 60 tablet 0  . terbinafine (LAMISIL) 1 % cream Apply 1 application topically 2 (two) times daily.    Marland Kitchen tretinoin (RETIN-A) 0.1 % cream Apply 1 application topically at bedtime.     No current facility-administered medications for this visit.    Facility-Administered Medications Ordered in Other Visits  Medication Dose Route Frequency Provider Last Rate Last Dose   . alteplase (CATHFLO ACTIVASE) injection 2 mg  2 mg Intracatheter Once PRN Philip Portela, MD      . alteplase (CATHFLO ACTIVASE) injection 2 mg  2 mg Intracatheter Once PRN Philip Portela, MD      . alteplase (CATHFLO ACTIVASE) injection 2 mg  2 mg Intracatheter Once PRN Philip Portela, MD         Allergies: No Known Allergies  Past Medical History, Surgical history, Social history, and Family History were reviewed and updated.   Physical Exam: Blood pressure (!) 155/86, pulse 74, temperature 98.6 F (37 C), temperature source Oral, resp. rate 18, height 6\' 4"  (1.93 m), weight 196 lb 11.2 oz (89.2 kg), SpO2 100 %.  ECOG: 0 General appearance: Well-appearing gentleman without distress. Head: Normocephalic, without obvious abnormality No oral ulcers or lesions. Neck: no adenopathy Lymph nodes: Cervical, supraclavicular, and axillary nodes normal. Heart:regular rate and rhythm, S1, S2 normal, no murmur, click, rub or gallop Lung:chest clear, no wheezing, rales, normal symmetric air entry Abdomin: soft, non-tender, without masses or organomegaly no shifting dullness or ascites. EXT: Trace edema which is unchanged.      Lab Results: Lab Results  Component Value Date   WBC 6.8 09/17/2016   HGB 12.9 (L) 09/17/2016   HCT 39.6 09/17/2016   MCV 95.4 09/17/2016   PLT 215 09/17/2016     Chemistry      Component Value Date/Time   NA 133 (L) 09/17/2016 1102   K 4.2 09/17/2016 1102   CL 104 02/09/2015 1305   CO2 23 09/17/2016 1102   BUN 15.8 09/17/2016 1102   CREATININE 0.9 09/17/2016 1102      Component Value Date/Time   CALCIUM 9.2 09/17/2016 1102   ALKPHOS 50 09/17/2016 1102   AST 16 09/17/2016 1102   ALT 13 09/17/2016 1102   BILITOT 0.49 09/17/2016 1102      Results for Philip [name] (MRN [id number]) as of 09/19/2016 15:42  Ref. Range 07/07/2016 09:35 08/13/2016 09:16 09/17/2016 11:02  Prostate Specific Ag, Serum Latest Ref Range: 0.0 - 4.0 ng/mL 1.4 1.3 1.2       Impression and Plan:  71 year old gentleman with the following issues:  1. Advanced prostate cancer diagnosed in October 2016. He presented with PSA of 2900 and unknown Gleason score. His disease includes widespread bony metastasis as well as pelvic adenopathy. He is status post bilateral orchiectomy done in November 2016.  He is S/P Taxotere at 75 mg/m for a total of 6 cycles that is well-tolerated. His PSA nadir was 5.4.  He started [place] in July 2017 and has not reported any complications since last visit.  His PSA continues to decline gradually without any evidence to suggest recurrent disease. The plan is to continue the same dose and schedule and consider discontinuation of prednisone if he develops further emotional concerns.   2. IV access: Port-A-Cath  in place without complications. This will be flushed every visit.  3. Hypertension: His blood pressure continues to be within normal range.  4. Hormone therapy: He is status post bilateral orchiectomy.   5. Bone directed therapy:  Currently on Xgeva without complications. Risks and benefits of continuing this medication was reviewed today and is agreeable to continue.  6. Hypokalemia. His potassium remains within normal range.  7. Emotional concerns: This could be a function of previous treatment and his orchiectomy. He also on prednisone which can affect his mood and emotions. We have discussed the possibility of discontinuing prednisone in the future if this becomes an issue.  8. Follow-up: He will also be in 4 weeks.     Zola Button, MD 7/20/20183:37 PM

## 2016-09-19 NOTE — Telephone Encounter (Signed)
Scheduled appt per 7/20 los - Gave patient AVS and calender per los.  

## 2016-09-19 NOTE — Patient Instructions (Signed)
Denosumab injection  What is this medicine?  DENOSUMAB (den oh sue mab) slows bone breakdown. Prolia is used to treat osteoporosis in women after menopause and in men. Xgeva is used to prevent bone fractures and other bone problems caused by cancer bone metastases. Xgeva is also used to treat giant cell tumor of the bone.  This medicine may be used for other purposes; ask your health care provider or pharmacist if you have questions.  What should I tell my health care provider before I take this medicine?  They need to know if you have any of these conditions:  -dental disease  -eczema  -infection or history of infections  -kidney disease or on dialysis  -low blood calcium or vitamin D  -malabsorption syndrome  -scheduled to have surgery or tooth extraction  -taking medicine that contains denosumab  -thyroid or parathyroid disease  -an unusual reaction to denosumab, other medicines, foods, dyes, or preservatives  -pregnant or trying to get pregnant  -breast-feeding  How should I use this medicine?  This medicine is for injection under the skin. It is given by a health care professional in a hospital or clinic setting.  If you are getting Prolia, a special MedGuide will be given to you by the pharmacist with each prescription and refill. Be sure to read this information carefully each time.  For Prolia, talk to your pediatrician regarding the use of this medicine in children. Special care may be needed. For Xgeva, talk to your pediatrician regarding the use of this medicine in children. While this drug may be prescribed for children as young as 13 years for selected conditions, precautions do apply.  Overdosage: If you think you have taken too much of this medicine contact a poison control center or emergency room at once.  NOTE: This medicine is only for you. Do not share this medicine with others.  What if I miss a dose?  It is important not to miss your dose. Call your doctor or health care professional if you are  unable to keep an appointment.  What may interact with this medicine?  Do not take this medicine with any of the following medications:  -other medicines containing denosumab  This medicine may also interact with the following medications:  -medicines that suppress the immune system  -medicines that treat cancer  -steroid medicines like prednisone or cortisone  This list may not describe all possible interactions. Give your health care provider a list of all the medicines, herbs, non-prescription drugs, or dietary supplements you use. Also tell them if you smoke, drink alcohol, or use illegal drugs. Some items may interact with your medicine.  What should I watch for while using this medicine?  Visit your doctor or health care professional for regular checks on your progress. Your doctor or health care professional may order blood tests and other tests to see how you are doing.  Call your doctor or health care professional if you get a cold or other infection while receiving this medicine. Do not treat yourself. This medicine may decrease your body's ability to fight infection.  You should make sure you get enough calcium and vitamin D while you are taking this medicine, unless your doctor tells you not to. Discuss the foods you eat and the vitamins you take with your health care professional.  See your dentist regularly. Brush and floss your teeth as directed. Before you have any dental work done, tell your dentist you are receiving this medicine.  Do   not become pregnant while taking this medicine or for 5 months after stopping it. Women should inform their doctor if they wish to become pregnant or think they might be pregnant. There is a potential for serious side effects to an unborn child. Talk to your health care professional or pharmacist for more information.  What side effects may I notice from receiving this medicine?  Side effects that you should report to your doctor or health care professional as soon as  possible:  -allergic reactions like skin rash, itching or hives, swelling of the face, lips, or tongue  -breathing problems  -chest pain  -fast, irregular heartbeat  -feeling faint or lightheaded, falls  -fever, chills, or any other sign of infection  -muscle spasms, tightening, or twitches  -numbness or tingling  -skin blisters or bumps, or is dry, peels, or red  -slow healing or unexplained pain in the mouth or jaw  -unusual bleeding or bruising  Side effects that usually do not require medical attention (Report these to your doctor or health care professional if they continue or are bothersome.):  -muscle pain  -stomach upset, gas  This list may not describe all possible side effects. Call your doctor for medical advice about side effects. You may report side effects to FDA at 1-800-FDA-1088.  Where should I keep my medicine?  This medicine is only given in a clinic, doctor's office, or other health care setting and will not be stored at home.  NOTE: This sheet is a summary. It may not cover all possible information. If you have questions about this medicine, talk to your doctor, pharmacist, or health care provider.      2016, [employer] Standard. (2011-08-18 12:37:47)

## 2016-09-29 ENCOUNTER — Other Ambulatory Visit: Payer: Self-pay | Admitting: *Deleted

## 2016-09-29 MED ORDER — PREDNISONE 5 MG PO TABS: 5.0000 mg | ORAL_TABLET | Freq: Every day | ORAL | 2 refills | Status: DC

## 2016-10-22 ENCOUNTER — Other Ambulatory Visit: Payer: Self-pay | Admitting: *Deleted

## 2016-10-22 ENCOUNTER — Other Ambulatory Visit (HOSPITAL_BASED_OUTPATIENT_CLINIC_OR_DEPARTMENT_OTHER): Payer: Medicare Other

## 2016-10-22 ENCOUNTER — Ambulatory Visit (HOSPITAL_BASED_OUTPATIENT_CLINIC_OR_DEPARTMENT_OTHER): Payer: Medicare Other

## 2016-10-22 DIAGNOSIS — Z95828 Presence of other vascular implants and grafts: Secondary | ICD-10-CM

## 2016-10-22 DIAGNOSIS — C61 Malignant neoplasm of prostate: Secondary | ICD-10-CM

## 2016-10-22 LAB — COMPREHENSIVE METABOLIC PANEL
ALT: 14 U/L (ref 0–55)
ANION GAP: 7 meq/L (ref 3–11)
AST: 18 U/L (ref 5–34)
Albumin: 3.7 g/dL (ref 3.5–5.0)
Alkaline Phosphatase: 49 U/L (ref 40–150)
BUN: 14 mg/dL (ref 7.0–26.0)
CALCIUM: 9.4 mg/dL (ref 8.4–10.4)
CHLORIDE: 103 meq/L (ref 98–109)
CO2: 26 meq/L (ref 22–29)
Creatinine: 1 mg/dL (ref 0.7–1.3)
EGFR: 80 mL/min/{1.73_m2} — AB (ref >90)
Glucose: 99 mg/dl (ref 70–140)
POTASSIUM: 4 meq/L (ref 3.5–5.1)
Sodium: 136 mEq/L (ref 136–145)
Total Bilirubin: 0.53 mg/dL (ref 0.20–1.20)
Total Protein: 6.8 g/dL (ref 6.4–8.3)

## 2016-10-22 LAB — CBC WITH DIFFERENTIAL/PLATELET
BASO%: 0.5 % (ref 0.0–2.0)
BASOS ABS: 0 10*3/uL (ref 0.0–0.1)
EOS%: 2.6 % (ref 0.0–7.0)
Eosinophils Absolute: 0.2 10*3/uL (ref 0.0–0.5)
HEMATOCRIT: 39.7 % (ref 38.4–49.9)
HGB: 13.2 g/dL (ref 13.0–17.1)
LYMPH#: 1.5 10*3/uL (ref 0.9–3.3)
LYMPH%: 26.3 % (ref 14.0–49.0)
MCH: 31.6 pg (ref 27.2–33.4)
MCHC: 33.2 g/dL (ref 32.0–36.0)
MCV: 95 fL (ref 79.3–98.0)
MONO#: 0.6 10*3/uL (ref 0.1–0.9)
MONO%: 10.8 % (ref 0.0–14.0)
NEUT#: 3.4 10*3/uL (ref 1.5–6.5)
NEUT%: 59.8 % (ref 39.0–75.0)
Platelets: 217 10*3/uL (ref 140–400)
RBC: 4.18 10*6/uL — AB (ref 4.20–5.82)
RDW: 13.3 % (ref 11.0–14.6)
WBC: 5.7 10*3/uL (ref 4.0–10.3)

## 2016-10-22 MED ORDER — HEPARIN SOD (PORK) LOCK FLUSH 100 UNIT/ML IV SOLN
500.0000 [IU] | Freq: Once | INTRAVENOUS | Status: DC | PRN
  Filled 2016-10-22: qty 5

## 2016-10-22 MED ORDER — ABIRATERONE ACETATE 250 MG PO TABS: 1000.0000 mg | ORAL_TABLET | Freq: Every day | ORAL | 0 refills | Status: DC

## 2016-10-22 MED ORDER — SODIUM CHLORIDE 0.9 % IJ SOLN
10.0000 mL | INTRAMUSCULAR | Status: DC | PRN
Start: 2016-10-22 — End: 2016-10-22
  Administered 2016-10-22: 10 mL via INTRAVENOUS
  Filled 2016-10-22: qty 10

## 2016-10-23 LAB — PSA: PROSTATE SPECIFIC AG, SERUM: 0.9 ng/mL (ref 0.0–4.0)

## 2016-10-24 ENCOUNTER — Ambulatory Visit (HOSPITAL_BASED_OUTPATIENT_CLINIC_OR_DEPARTMENT_OTHER): Payer: Medicare Other | Admitting: Oncology

## 2016-10-24 ENCOUNTER — Telehealth: Payer: Self-pay | Admitting: Oncology

## 2016-10-24 ENCOUNTER — Ambulatory Visit (HOSPITAL_BASED_OUTPATIENT_CLINIC_OR_DEPARTMENT_OTHER): Payer: Medicare Other

## 2016-10-24 VITALS — BP 137/88 | HR 78 | Temp 98.0°F | Resp 22 | Ht 76.0 in | Wt 201.0 lb

## 2016-10-24 DIAGNOSIS — C7951 Secondary malignant neoplasm of bone: Secondary | ICD-10-CM

## 2016-10-24 DIAGNOSIS — C61 Malignant neoplasm of prostate: Secondary | ICD-10-CM

## 2016-10-24 DIAGNOSIS — I10 Essential (primary) hypertension: Secondary | ICD-10-CM | POA: Diagnosis not present

## 2016-10-24 MED ORDER — DENOSUMAB 120 MG/1.7ML ~~LOC~~ SOLN
120.0000 mg | Freq: Once | SUBCUTANEOUS | Status: AC
  Administered 2016-10-24: 120 mg via SUBCUTANEOUS
  Filled 2016-10-24: qty 1.7

## 2016-10-24 NOTE — Progress Notes (Signed)
Hematology and Oncology Follow Up Visit  Philip [name] [id number] 1945-07-03 71 y.o. 10/24/2016 3:52 PM Patient, No Pcp PerNo ref. provider found   Principle Diagnosis: 71 year old gentleman with advanced prostate cancer diagnosed in October 2016. He presented with a PSA of 2900 and widespread bone metastasis as well as pelvic adenopathy.    Prior Therapy: Status post orchiectomy done on November 2016. Status post port placement on 02/09/2015. Taxotere chemotherapy at 75 mg/m to start on 02/16/2015. He is status post 6 cycles of therapy last cycle received in March 2017.  Current therapy: Zytiga 1000 mg daily with prednisone at 5 mg daily started on 09/13/2015.  Interim History:  Philip [name] presents today for a follow-up visit. Since the last visit, he reports no recent complaints or changes. He continues to be in excellent health and enjoys excellent performance status. He denied any recent illnesses. He is able to walk regularly and attends activities of daily living. He denies any recent hospitalizations or illnesses. He denied any bone pain or pathological fractures.   He continues on Zytiga with prednisone without complications. He denied any excessive fatigue or tiredness. He has no difficulties obtaining this medication or taking it on a regular basis. He denied any lower extremity edema or urinary symptoms.  He does not report any headaches, blurry vision, syncope or seizures. He does not report any fevers, chills, sweats or weight loss. He does not report any chest pain, palpitation, orthopnea. Does not report any cough, wheezing, hemoptysis or dyspnea on exertion. He does not report any nausea, vomiting, abdominal pain, hematochezia. He does not report any frequency, urgency or hesitancy. Does not report any hematuria or dysuria. Does not report any lymphadenopathy or petechiae. Remaining review of systems unremarkable  Medications: I have reviewed the patient's current medications.    Current Outpatient Prescriptions  Medication Sig Dispense Refill  . abiraterone Acetate (ZYTIGA) 250 MG tablet Take 4 tablets (1,000 mg total) by mouth daily. Take on an empty stomach 1 hour before or 2 hours after a meal 120 tablet 0  . BIOTIN 5000 PO Take 1 tablet by mouth daily.    . Calcium Carbonate-Vitamin D3 (CALCIUM 600+D3) 600-400 MG-UNIT TABS Take 1 tablet by mouth daily.    . Denosumab (XGEVA [place]) Inject into the skin.    . fexofenadine (ALLEGRA) 180 MG tablet Take 180 mg by mouth daily.    . fluticasone (FLONASE) 50 MCG/ACT nasal spray Place 1 spray into both nostrils daily as needed for allergies or rhinitis.    . Glycerin-Polysorbate 80 (REFRESH DRY EYE THERAPY OP) Apply 1 drop to eye daily.    . halobetasol (ULTRAVATE) 0.05 % ointment Apply 1 application topically as needed. For exzema.    . hydrOXYzine (ATARAX/VISTARIL) 10 MG tablet Take 10 mg by mouth every 8 (eight) hours.  4  . lidocaine-prilocaine (EMLA) cream Apply 1 application topically as needed. Apply one teaspoon topically to port 1-2 hours prior to access. 30 g 2  . Multiple Vitamin (MULTIVITAMIN WITH MINERALS) TABS tablet Take 1 tablet by mouth daily.    . ondansetron (ZOFRAN) 4 MG tablet Take 1 tablet (4 mg total) by mouth every 8 (eight) hours as needed for nausea or vomiting. 20 tablet 0  . predniSONE (DELTASONE) 5 MG tablet Take 1 tablet (5 mg total) by mouth daily with breakfast. 60 tablet 2  . terbinafine (LAMISIL) 1 % cream Apply 1 application topically 2 (two) times daily.    Marland Kitchen tretinoin (RETIN-A) 0.1 % cream Apply  1 application topically at bedtime.     No current facility-administered medications for this visit.    Facility-Administered Medications Ordered in Other Visits  Medication Dose Route Frequency Provider Last Rate Last Dose  . alteplase (CATHFLO ACTIVASE) injection 2 mg  2 mg Intracatheter Once PRN [name], MD      . alteplase (CATHFLO ACTIVASE) injection 2 mg  2 mg Intracatheter Once PRN  [name], MD         Allergies: No Known Allergies  Past Medical History, Surgical history, Social history, and Family History were reviewed and updated.   Physical Exam: Blood pressure 137/88, pulse 78, temperature 98 F (36.7 C), temperature source Oral, resp. rate (!) 22, height 6\' 4"  (1.93 m), weight 201 lb (91.2 kg), SpO2 100 %.  ECOG: 0 General appearance: Alert, awake gentleman without distress. Head: Normocephalic, without obvious abnormality No oral thrush or ulcers. Neck: no adenopathy Lymph nodes: Cervical, supraclavicular, and axillary nodes normal. Heart:regular rate and rhythm, S1, S2 normal, no murmur, click, rub or gallop Lung:chest clear, no wheezing, rales, normal symmetric air entry Abdomin: soft, non-tender, without masses or organomegaly no rebound or guarding. EXT: Trace edema which is unchanged.      Lab Results: Lab Results  Component Value Date   WBC 5.7 10/22/2016   HGB 13.2 10/22/2016   HCT 39.7 10/22/2016   MCV 95.0 10/22/2016   PLT 217 10/22/2016     Chemistry      Component Value Date/Time   NA 136 10/22/2016 0943   K 4.0 10/22/2016 0943   CL 104 02/09/2015 1305   CO2 26 10/22/2016 0943   BUN 14.0 10/22/2016 0943   CREATININE 1.0 10/22/2016 0943      Component Value Date/Time   CALCIUM 9.4 10/22/2016 0943   ALKPHOS 49 10/22/2016 0943   AST 18 10/22/2016 0943   ALT 14 10/22/2016 0943   BILITOT 0.53 10/22/2016 0943     Results for Philip [name] (MRN [id number]) as of 10/24/2016 15:43  Ref. Range 09/17/2016 11:02 10/22/2016 09:43  Prostate Specific Ag, Serum Latest Ref Range: 0.0 - 4.0 ng/mL 1.2 0.9        Impression and Plan:  71 year old gentleman with the following issues:  1. Advanced prostate cancer diagnosed in October 2016. He presented with PSA of 2900 and unknown Gleason score. His disease includes widespread bony metastasis as well as pelvic adenopathy. He is status post bilateral orchiectomy done in November  2016.  He is S/P Taxotere at 75 mg/m for a total of 6 cycles that is well-tolerated. His PSA nadir was 5.4.  He started [place] in July 2017 and continues to tolerated very well.   His PSA continues to decline gradually without any evidence to suggest recurrent disease. Risks and benefits of continuing Zytiga were reviewed today and he is agreeable to continue. We will repeat imaging studies potentially in January 2019 or sooner if we need to.  2. IV access: Port-A-Cath  in place without complications. This will be flushed every visit.  3. Hypertension: His blood pressure remains normal at this time.  4. Hormone therapy: He is status post bilateral orchiectomy.   5. Bone directed therapy: Currently on Xgeva without complications. Risks and benefits associated with this medication were reviewed again. Complications includes osteonecrosis of the jaw among others were discussed and is agreeable to continue.  6. Hypokalemia. His potassium remains within normal range.   7. Follow-up: He will also be in 6 weeks.  Zola Button, MD 8/24/20183:52 PM

## 2016-10-24 NOTE — Telephone Encounter (Signed)
Gave patient AVS report and calendar of upcoming October appointments.

## 2016-10-24 NOTE — Patient Instructions (Signed)

## 2016-10-27 ENCOUNTER — Telehealth: Payer: Self-pay | Admitting: *Deleted

## 2016-10-27 NOTE — Telephone Encounter (Signed)
"  I was approved for Zytiga for a year.  Last week only one refill was ordered.  Could I have a refill sent with enough to last at least until I'm seen in October.  If Dr. Alen Blew can authorize this when he returns September 4th that will be great."

## 2016-10-30 ENCOUNTER — Other Ambulatory Visit: Payer: Self-pay | Admitting: *Deleted

## 2016-10-30 DIAGNOSIS — C61 Malignant neoplasm of prostate: Secondary | ICD-10-CM

## 2016-10-30 MED ORDER — ABIRATERONE ACETATE 250 MG PO TABS: 1000.0000 mg | ORAL_TABLET | Freq: Every day | ORAL | 0 refills | Status: DC

## 2016-11-06 NOTE — Telephone Encounter (Signed)
Encounter closed, refill sent by collaborative on 10-30-2016.

## 2016-11-12 DIAGNOSIS — H25013 Cortical age-related cataract, bilateral: Secondary | ICD-10-CM | POA: Diagnosis not present

## 2016-11-12 DIAGNOSIS — H2513 Age-related nuclear cataract, bilateral: Secondary | ICD-10-CM | POA: Diagnosis not present

## 2016-11-19 DIAGNOSIS — B351 Tinea unguium: Secondary | ICD-10-CM | POA: Diagnosis not present

## 2016-11-19 DIAGNOSIS — D692 Other nonthrombocytopenic purpura: Secondary | ICD-10-CM | POA: Diagnosis not present

## 2016-11-19 DIAGNOSIS — L309 Dermatitis, unspecified: Secondary | ICD-10-CM | POA: Diagnosis not present

## 2016-11-19 DIAGNOSIS — L814 Other melanin hyperpigmentation: Secondary | ICD-10-CM | POA: Diagnosis not present

## 2016-11-19 DIAGNOSIS — D1801 Hemangioma of skin and subcutaneous tissue: Secondary | ICD-10-CM | POA: Diagnosis not present

## 2016-11-19 DIAGNOSIS — L918 Other hypertrophic disorders of the skin: Secondary | ICD-10-CM | POA: Diagnosis not present

## 2016-11-19 DIAGNOSIS — D485 Neoplasm of uncertain behavior of skin: Secondary | ICD-10-CM | POA: Diagnosis not present

## 2016-11-19 DIAGNOSIS — L080 Pyoderma: Secondary | ICD-10-CM | POA: Diagnosis not present

## 2016-11-19 DIAGNOSIS — L821 Other seborrheic keratosis: Secondary | ICD-10-CM | POA: Diagnosis not present

## 2016-11-19 DIAGNOSIS — D2261 Melanocytic nevi of right upper limb, including shoulder: Secondary | ICD-10-CM | POA: Diagnosis not present

## 2016-11-26 DIAGNOSIS — T1511XA Foreign body in conjunctival sac, right eye, initial encounter: Secondary | ICD-10-CM | POA: Diagnosis not present

## 2016-11-26 DIAGNOSIS — S00211A Abrasion of right eyelid and periocular area, initial encounter: Secondary | ICD-10-CM | POA: Diagnosis not present

## 2016-12-03 ENCOUNTER — Telehealth: Payer: Self-pay | Admitting: Oncology

## 2016-12-03 NOTE — Telephone Encounter (Signed)
Received a New Prescription Musician Request form for medication on 12/02/2016. A copy of this is located in [facility] under the Media tab for viewing and printing.

## 2016-12-07 DIAGNOSIS — Z23 Encounter for immunization: Secondary | ICD-10-CM | POA: Diagnosis not present

## 2016-12-08 ENCOUNTER — Telehealth: Payer: Self-pay

## 2016-12-08 ENCOUNTER — Telehealth: Payer: Self-pay | Admitting: Oncology

## 2016-12-08 NOTE — Telephone Encounter (Signed)
[facility] called for zytiga refill to be faxed to pt assistance program.

## 2016-12-08 NOTE — Telephone Encounter (Signed)
12/04/16 prescription refill. Prescription scanned on media

## 2016-12-09 ENCOUNTER — Ambulatory Visit (HOSPITAL_BASED_OUTPATIENT_CLINIC_OR_DEPARTMENT_OTHER): Payer: Medicare Other | Admitting: Oncology

## 2016-12-09 ENCOUNTER — Other Ambulatory Visit: Payer: Self-pay | Admitting: *Deleted

## 2016-12-09 ENCOUNTER — Telehealth: Payer: Self-pay | Admitting: Oncology

## 2016-12-09 ENCOUNTER — Telehealth: Payer: Self-pay | Admitting: *Deleted

## 2016-12-09 VITALS — BP 157/85 | HR 67 | Temp 98.1°F | Resp 18 | Ht 76.0 in | Wt 201.8 lb

## 2016-12-09 DIAGNOSIS — F419 Anxiety disorder, unspecified: Secondary | ICD-10-CM

## 2016-12-09 DIAGNOSIS — C61 Malignant neoplasm of prostate: Secondary | ICD-10-CM

## 2016-12-09 DIAGNOSIS — C7951 Secondary malignant neoplasm of bone: Secondary | ICD-10-CM | POA: Diagnosis not present

## 2016-12-09 MED ORDER — LORAZEPAM 1 MG PO TABS: 1.0000 mg | ORAL_TABLET | Freq: Three times a day (TID) | ORAL | 0 refills | Status: DC

## 2016-12-09 MED ORDER — ABIRATERONE ACETATE 250 MG PO TABS: 1000.0000 mg | ORAL_TABLET | Freq: Every day | ORAL | 0 refills | Status: DC

## 2016-12-09 NOTE — Telephone Encounter (Signed)
Returned patient's phone call regarding anxiety. Patient stated,"I can't focus, I'm afraid, I've tried walking, exercising and nothing. I can't be still. My pulse is 70." I told patient he should be seen by his PCP. Patient stated,"Dr. Alen [name] told me I don't need a PCP. I can come up there right now so, he can see me." Instructed patient that I needed to talk with Dr. Alen [name] first, then I will call him back. Patient verbalized understanding.

## 2016-12-09 NOTE — Telephone Encounter (Signed)
I can see him at 11:45 today.

## 2016-12-09 NOTE — Telephone Encounter (Signed)
As noted below by Dr. Alen Blew, I called patient to come in at 11:45 am. Patient is able to come in.

## 2016-12-09 NOTE — Progress Notes (Signed)
Hematology and Oncology Follow Up Visit  Philip [name] [id number] Jul 04, 1945 71 y.o. 12/09/2016 12:32 PM Patient, No Pcp PerNo ref. provider found   Principle Diagnosis: 71 year old gentleman with advanced prostate cancer diagnosed in October 2016. He presented with a PSA of 2900 and widespread bone metastasis as well as pelvic adenopathy.    Prior Therapy: Status post orchiectomy done on November 2016. Status post port placement on 02/09/2015. Taxotere chemotherapy at 75 mg/m to start on 02/16/2015. He is status post 6 cycles of therapy last cycle received in March 2017.  Current therapy: Zytiga 1000 mg daily with prednisone at 5 mg daily started on 09/13/2015.  Interim History:  Philip [name] presents today for a requested walk and. Since the last visit, he reports complains of anxiety and insomnia. These complaints have been increasing the last few weeks with feeling jittery, claustrophobic and very emotional at times. He denied any physical complaints including no chest pain or difficulty breathing. His appetite is been excellent and performance status is adequate. He reports his mood is also depressed which is very unusual for him.   He does not report any headaches, blurry vision, syncope or seizures. He does not report any fevers, chills, sweats or weight loss. He does not report any chest pain, palpitation, orthopnea. Does not report any cough, wheezing, hemoptysis or dyspnea on exertion. He does not report any nausea, vomiting, abdominal pain, hematochezia. He does not report any frequency, urgency or hesitancy. Does not report any hematuria or dysuria. Does not report any lymphadenopathy or petechiae. Remaining review of systems unremarkable  Medications: I have reviewed the patient's current medications.   Current Outpatient Prescriptions  Medication Sig Dispense Refill  . abiraterone Acetate (ZYTIGA) 250 MG tablet Take 4 tablets (1,000 mg total) by mouth daily. Take on an empty stomach 1  hour before or 2 hours after a meal 120 tablet 0  . BIOTIN 5000 PO Take 1 tablet by mouth daily.    . Calcium Carbonate-Vitamin D3 (CALCIUM 600+D3) 600-400 MG-UNIT TABS Take 1 tablet by mouth daily.    . Denosumab (XGEVA [place]) Inject into the skin.    . fexofenadine (ALLEGRA) 180 MG tablet Take 180 mg by mouth daily.    . fluticasone (FLONASE) 50 MCG/ACT nasal spray Place 1 spray into both nostrils daily as needed for allergies or rhinitis.    . Glycerin-Polysorbate 80 (REFRESH DRY EYE THERAPY OP) Apply 1 drop to eye daily.    . halobetasol (ULTRAVATE) 0.05 % ointment Apply 1 application topically as needed. For exzema.    . hydrOXYzine (ATARAX/VISTARIL) 10 MG tablet Take 10 mg by mouth every 8 (eight) hours.  4  . lidocaine-prilocaine (EMLA) cream Apply 1 application topically as needed. Apply one teaspoon topically to port 1-2 hours prior to access. 30 g 2  . LORazepam (ATIVAN) 1 MG tablet Take 1 tablet (1 mg total) by mouth every 8 (eight) hours. 30 tablet 0  . Multiple Vitamin (MULTIVITAMIN WITH MINERALS) TABS tablet Take 1 tablet by mouth daily.    . ondansetron (ZOFRAN) 4 MG tablet Take 1 tablet (4 mg total) by mouth every 8 (eight) hours as needed for nausea or vomiting. 20 tablet 0  . predniSONE (DELTASONE) 5 MG tablet Take 1 tablet (5 mg total) by mouth daily with breakfast. 60 tablet 2  . terbinafine (LAMISIL) 1 % cream Apply 1 application topically 2 (two) times daily.    Marland Kitchen tretinoin (RETIN-A) 0.1 % cream Apply 1 application topically at bedtime.  No current facility-administered medications for this visit.    Facility-Administered Medications Ordered in Other Visits  Medication Dose Route Frequency Provider Last Rate Last Dose  . alteplase (CATHFLO ACTIVASE) injection 2 mg  2 mg Intracatheter Once PRN [name], MD      . alteplase (CATHFLO ACTIVASE) injection 2 mg  2 mg Intracatheter Once PRN [name], MD         Allergies: No Known Allergies  Past Medical  History, Surgical history, Social history, and Family History were reviewed and updated.   Physical Exam: Blood pressure (!) 157/85, pulse 67, temperature 98.1 F (36.7 C), temperature source Oral, resp. rate 18, height 6\' 4"  (1.93 m), weight 201 lb 12.8 oz (91.5 kg), SpO2 100 %.  ECOG: 0 General appearance: Ill-appearing gentleman appeared without distress. Head: Normocephalic, without obvious abnormality No oral ulcers or lesions. Neck: no adenopathy Lymph nodes: Cervical, supraclavicular, and axillary nodes normal. Heart:regular rate and rhythm, S1, S2 normal, no murmur, click, rub or gallop Lung:chest clear, no wheezing, rales, normal symmetric air entry Abdomin: soft, non-tender, without masses or organomegaly no shifting dullness or ascites. EXT: Trace edema which is unchanged.      Lab Results: Lab Results  Component Value Date   WBC 5.7 10/22/2016   HGB 13.2 10/22/2016   HCT 39.7 10/22/2016   MCV 95.0 10/22/2016   PLT 217 10/22/2016     Chemistry      Component Value Date/Time   NA 136 10/22/2016 0943   K 4.0 10/22/2016 0943   CL 104 02/09/2015 1305   CO2 26 10/22/2016 0943   BUN 14.0 10/22/2016 0943   CREATININE 1.0 10/22/2016 0943      Component Value Date/Time   CALCIUM 9.4 10/22/2016 0943   ALKPHOS 49 10/22/2016 0943   AST 18 10/22/2016 0943   ALT 14 10/22/2016 0943   BILITOT 0.53 10/22/2016 0943            Impression and Plan:  71 year old gentleman with the following issues:  1. Advanced prostate cancer diagnosed in October 2016. He presented with PSA of 2900 and unknown Gleason score. His disease includes widespread bony metastasis as well as pelvic adenopathy. He is status post bilateral orchiectomy done in November 2016.  He is S/P Taxotere at 75 mg/m for a total of 6 cycles that is well-tolerated. His PSA nadir was 5.4.  He started [place] in July 2017 and continues to tolerated very well.   I recommended discontinuation of prednisone  at this time as could be contributing to his anxiety. I assured him the efficacy of Zytiga should not be compromised and we will monitor him closely for any side effects associated with Zytiga alone without prednisone.  2. Anxiety: Scattered be related to prednisone versus a mood disorder. I recommended discontinuation of the prednisone as a first step and I gave him prescription for Ativan for anxiety. He might require antidepressant medication if these symptoms persist. I started him to take Ativan also at nighttime to help with his sleep for the time being.  3. Follow-up: He'll return next week for a follow-up to follow his progress.       Zola Button, MD 10/9/201812:32 PM

## 2016-12-09 NOTE — Telephone Encounter (Signed)
Scheduled appt per 10/9 sch msg. Patient is aware of time.

## 2016-12-10 ENCOUNTER — Telehealth: Payer: Self-pay | Admitting: Oncology

## 2016-12-10 NOTE — Telephone Encounter (Signed)
Per 10/9 - no los at checkout °

## 2016-12-15 ENCOUNTER — Ambulatory Visit (HOSPITAL_BASED_OUTPATIENT_CLINIC_OR_DEPARTMENT_OTHER): Payer: Medicare Other

## 2016-12-15 ENCOUNTER — Other Ambulatory Visit (HOSPITAL_BASED_OUTPATIENT_CLINIC_OR_DEPARTMENT_OTHER): Payer: Medicare Other

## 2016-12-15 DIAGNOSIS — Z95828 Presence of other vascular implants and grafts: Secondary | ICD-10-CM

## 2016-12-15 DIAGNOSIS — C61 Malignant neoplasm of prostate: Secondary | ICD-10-CM

## 2016-12-15 LAB — CBC WITH DIFFERENTIAL/PLATELET
BASO%: 0.5 % (ref 0.0–2.0)
BASOS ABS: 0 10*3/uL (ref 0.0–0.1)
EOS ABS: 0.1 10*3/uL (ref 0.0–0.5)
EOS%: 1.5 % (ref 0.0–7.0)
HCT: 36.2 % — ABNORMAL LOW (ref 38.4–49.9)
HGB: 12.2 g/dL — ABNORMAL LOW (ref 13.0–17.1)
LYMPH%: 26.7 % (ref 14.0–49.0)
MCH: 31.4 pg (ref 27.2–33.4)
MCHC: 33.6 g/dL (ref 32.0–36.0)
MCV: 93.5 fL (ref 79.3–98.0)
MONO#: 0.5 10*3/uL (ref 0.1–0.9)
MONO%: 10 % (ref 0.0–14.0)
NEUT#: 3.3 10*3/uL (ref 1.5–6.5)
NEUT%: 61.3 % (ref 39.0–75.0)
PLATELETS: 242 10*3/uL (ref 140–400)
RBC: 3.87 10*6/uL — AB (ref 4.20–5.82)
RDW: 13.1 % (ref 11.0–14.6)
WBC: 5.3 10*3/uL (ref 4.0–10.3)
lymph#: 1.4 10*3/uL (ref 0.9–3.3)

## 2016-12-15 LAB — COMPREHENSIVE METABOLIC PANEL
ALBUMIN: 3.6 g/dL (ref 3.5–5.0)
ALK PHOS: 53 U/L (ref 40–150)
ALT: 12 U/L (ref 0–55)
ANION GAP: 7 meq/L (ref 3–11)
AST: 17 U/L (ref 5–34)
BILIRUBIN TOTAL: 0.43 mg/dL (ref 0.20–1.20)
BUN: 13 mg/dL (ref 7.0–26.0)
CO2: 24 meq/L (ref 22–29)
Calcium: 9 mg/dL (ref 8.4–10.4)
Chloride: 107 mEq/L (ref 98–109)
Creatinine: 0.8 mg/dL (ref 0.7–1.3)
GLUCOSE: 95 mg/dL (ref 70–140)
POTASSIUM: 3.7 meq/L (ref 3.5–5.1)
SODIUM: 138 meq/L (ref 136–145)
TOTAL PROTEIN: 6.5 g/dL (ref 6.4–8.3)

## 2016-12-15 MED ORDER — SODIUM CHLORIDE 0.9 % IJ SOLN
10.0000 mL | INTRAMUSCULAR | Status: DC | PRN
  Administered 2016-12-15: 10 mL via INTRAVENOUS
  Filled 2016-12-15: qty 10

## 2016-12-15 MED ORDER — HEPARIN SOD (PORK) LOCK FLUSH 100 UNIT/ML IV SOLN
500.0000 [IU] | Freq: Once | INTRAVENOUS | Status: AC | PRN
  Administered 2016-12-15: 500 [IU] via INTRAVENOUS
  Filled 2016-12-15: qty 5

## 2016-12-15 NOTE — Patient Instructions (Signed)
Implanted Port Home Guide An implanted port is a type of central line that is placed under the skin. Central lines are used to provide IV access when treatment or nutrition needs to be given through a person's veins. Implanted ports are used for long-term IV access. An implanted port may be placed because:  You need IV medicine that would be irritating to the small veins in your hands or arms.  You need long-term IV medicines, such as antibiotics.  You need IV nutrition for a long period.  You need frequent blood draws for lab tests.  You need dialysis.  Implanted ports are usually placed in the chest area, but they can also be placed in the upper arm, the abdomen, or the leg. An implanted port has two main parts:  Reservoir. The reservoir is round and will appear as a small, raised area under your skin. The reservoir is the part where a needle is inserted to give medicines or draw blood.  Catheter. The catheter is a thin, flexible tube that extends from the reservoir. The catheter is placed into a large vein. Medicine that is inserted into the reservoir goes into the catheter and then into the vein.  How will I care for my incision site? Do not get the incision site wet. Bathe or shower as directed by your health care provider. How is my port accessed? Special steps must be taken to access the port:  Before the port is accessed, a numbing cream can be placed on the skin. This helps numb the skin over the port site.  Your health care provider uses a sterile technique to access the port. ? Your health care provider must put on a mask and sterile gloves. ? The skin over your port is cleaned carefully with an antiseptic and allowed to dry. ? The port is gently pinched between sterile gloves, and a needle is inserted into the port.  Only "non-coring" port needles should be used to access the port. Once the port is accessed, a blood return should be checked. This helps ensure that the port  is in the vein and is not clogged.  If your port needs to remain accessed for a constant infusion, a clear (transparent) bandage will be placed over the needle site. The bandage and needle will need to be changed every week, or as directed by your health care provider.  Keep the bandage covering the needle clean and dry. Do not get it wet. Follow your health care provider's instructions on how to take a shower or bath while the port is accessed.  If your port does not need to stay accessed, no bandage is needed over the port.  What is flushing? Flushing helps keep the port from getting clogged. Follow your health care provider's instructions on how and when to flush the port. Ports are usually flushed with saline solution or a medicine called heparin. The need for flushing will depend on how the port is used.  If the port is used for intermittent medicines or blood draws, the port will need to be flushed: ? After medicines have been given. ? After blood has been drawn. ? As part of routine maintenance.  If a constant infusion is running, the port may not need to be flushed.  How long will my port stay implanted? The port can stay in for as long as your health care provider thinks it is needed. When it is time for the port to come out, surgery will be   done to remove it. The procedure is similar to the one performed when the port was put in. When should I seek immediate medical care? When you have an implanted port, you should seek immediate medical care if:  You notice a bad smell coming from the incision site.  You have swelling, redness, or drainage at the incision site.  You have more swelling or pain at the port site or the surrounding area.  You have a fever that is not controlled with medicine.  This information is not intended to replace advice given to you by your health care provider. Make sure you discuss any questions you have with your health care provider. Document  Released: 02/17/2005 Document Revised: 07/26/2015 Document Reviewed: 10/25/2012 Elsevier Interactive Patient Education  2017 [employer]  

## 2016-12-16 ENCOUNTER — Ambulatory Visit (HOSPITAL_BASED_OUTPATIENT_CLINIC_OR_DEPARTMENT_OTHER): Payer: Medicare Other

## 2016-12-16 ENCOUNTER — Telehealth: Payer: Self-pay | Admitting: Oncology

## 2016-12-16 ENCOUNTER — Ambulatory Visit (HOSPITAL_BASED_OUTPATIENT_CLINIC_OR_DEPARTMENT_OTHER): Payer: Medicare Other | Admitting: Oncology

## 2016-12-16 VITALS — BP 147/93 | HR 82 | Temp 98.1°F | Resp 17 | Ht 76.0 in | Wt 205.3 lb

## 2016-12-16 DIAGNOSIS — Z95828 Presence of other vascular implants and grafts: Secondary | ICD-10-CM

## 2016-12-16 DIAGNOSIS — C7951 Secondary malignant neoplasm of bone: Secondary | ICD-10-CM

## 2016-12-16 DIAGNOSIS — C61 Malignant neoplasm of prostate: Secondary | ICD-10-CM

## 2016-12-16 DIAGNOSIS — I10 Essential (primary) hypertension: Secondary | ICD-10-CM | POA: Diagnosis not present

## 2016-12-16 LAB — PSA: PROSTATE SPECIFIC AG, SERUM: 0.8 ng/mL (ref 0.0–4.0)

## 2016-12-16 MED ORDER — LORAZEPAM 1 MG PO TABS: 1.0000 mg | ORAL_TABLET | Freq: Three times a day (TID) | ORAL | 0 refills | Status: DC

## 2016-12-16 MED ORDER — DENOSUMAB 120 MG/1.7ML ~~LOC~~ SOLN
120.0000 mg | Freq: Once | SUBCUTANEOUS | Status: AC
  Administered 2016-12-16: 120 mg via SUBCUTANEOUS
  Filled 2016-12-16: qty 1.7

## 2016-12-16 NOTE — Patient Instructions (Signed)
Denosumab injection  What is this medicine?  DENOSUMAB (den oh sue mab) slows bone breakdown. Prolia is used to treat osteoporosis in women after menopause and in men. Xgeva is used to prevent bone fractures and other bone problems caused by cancer bone metastases. Xgeva is also used to treat giant cell tumor of the bone.  This medicine may be used for other purposes; ask your health care provider or pharmacist if you have questions.  What should I tell my health care provider before I take this medicine?  They need to know if you have any of these conditions:  -dental disease  -eczema  -infection or history of infections  -kidney disease or on dialysis  -low blood calcium or vitamin D  -malabsorption syndrome  -scheduled to have surgery or tooth extraction  -taking medicine that contains denosumab  -thyroid or parathyroid disease  -an unusual reaction to denosumab, other medicines, foods, dyes, or preservatives  -pregnant or trying to get pregnant  -breast-feeding  How should I use this medicine?  This medicine is for injection under the skin. It is given by a health care professional in a hospital or clinic setting.  If you are getting Prolia, a special MedGuide will be given to you by the pharmacist with each prescription and refill. Be sure to read this information carefully each time.  For Prolia, talk to your pediatrician regarding the use of this medicine in children. Special care may be needed. For Xgeva, talk to your pediatrician regarding the use of this medicine in children. While this drug may be prescribed for children as young as 13 years for selected conditions, precautions do apply.  Overdosage: If you think you have taken too much of this medicine contact a poison control center or emergency room at once.  NOTE: This medicine is only for you. Do not share this medicine with others.  What if I miss a dose?  It is important not to miss your dose. Call your doctor or health care professional if you are  unable to keep an appointment.  What may interact with this medicine?  Do not take this medicine with any of the following medications:  -other medicines containing denosumab  This medicine may also interact with the following medications:  -medicines that suppress the immune system  -medicines that treat cancer  -steroid medicines like prednisone or cortisone  This list may not describe all possible interactions. Give your health care provider a list of all the medicines, herbs, non-prescription drugs, or dietary supplements you use. Also tell them if you smoke, drink alcohol, or use illegal drugs. Some items may interact with your medicine.  What should I watch for while using this medicine?  Visit your doctor or health care professional for regular checks on your progress. Your doctor or health care professional may order blood tests and other tests to see how you are doing.  Call your doctor or health care professional if you get a cold or other infection while receiving this medicine. Do not treat yourself. This medicine may decrease your body's ability to fight infection.  You should make sure you get enough calcium and vitamin D while you are taking this medicine, unless your doctor tells you not to. Discuss the foods you eat and the vitamins you take with your health care professional.  See your dentist regularly. Brush and floss your teeth as directed. Before you have any dental work done, tell your dentist you are receiving this medicine.  Do   not become pregnant while taking this medicine or for 5 months after stopping it. Women should inform their doctor if they wish to become pregnant or think they might be pregnant. There is a potential for serious side effects to an unborn child. Talk to your health care professional or pharmacist for more information.  What side effects may I notice from receiving this medicine?  Side effects that you should report to your doctor or health care professional as soon as  possible:  -allergic reactions like skin rash, itching or hives, swelling of the face, lips, or tongue  -breathing problems  -chest pain  -fast, irregular heartbeat  -feeling faint or lightheaded, falls  -fever, chills, or any other sign of infection  -muscle spasms, tightening, or twitches  -numbness or tingling  -skin blisters or bumps, or is dry, peels, or red  -slow healing or unexplained pain in the mouth or jaw  -unusual bleeding or bruising  Side effects that usually do not require medical attention (Report these to your doctor or health care professional if they continue or are bothersome.):  -muscle pain  -stomach upset, gas  This list may not describe all possible side effects. Call your doctor for medical advice about side effects. You may report side effects to FDA at 1-800-FDA-1088.  Where should I keep my medicine?  This medicine is only given in a clinic, doctor's office, or other health care setting and will not be stored at home.  NOTE: This sheet is a summary. It may not cover all possible information. If you have questions about this medicine, talk to your doctor, pharmacist, or health care provider.      2016, [employer] Standard. (2011-08-18 12:37:47)

## 2016-12-16 NOTE — Progress Notes (Signed)
Hematology and Oncology Follow Up Visit  Philip [name] [id number] 10-29-1945 71 y.o. 12/16/2016 3:55 PM Patient, No Pcp PerNo ref. provider found   Principle Diagnosis: 71 year old gentleman with advanced prostate cancer diagnosed in October 2016. He presented with a PSA of 2900 and widespread bone metastasis as well as pelvic adenopathy.    Prior Therapy: Status post orchiectomy done on November 2016. Status post port placement on 02/09/2015. Taxotere chemotherapy at 75 mg/m to start on 02/16/2015. He is status post 6 cycles of therapy last cycle received in March 2017.  Current therapy: Zytiga 1000 mg daily with prednisone at 5 mg daily started on 09/13/2015. Prednisone was discontinued in October 2018 for anxiety.  Interim History:  [name] presents today for a follow-up visit. Since the last visit, he reports discontinuing prednisone because of increased anxiety. He was also prescribed Ativan which have helped alleviate his symptoms. Since stopping prednisone, he reports increased swelling in his left lower leg. He has baseline edema to start with but certainly increased since stopping prednisone. He denied any trauma or change in his activity level.  It is unclear whether stopping pr or his anxiety. He reports his anxiety is better but he is not sure if this is related to the Ativan he is taking. He does not report any other complaints including back pain, hip pain or pathological fractures.He is less ambulatory because of his increased leg swelling.  He does not report any headaches, blurry vision, syncope or seizures. He does not report any fevers, chills, sweats or weight loss. He does not report any chest pain, palpitation, orthopnea. Does not report any cough, wheezing, hemoptysis or dyspnea on exertion. He does not report any nausea, vomiting, abdominal pain, hematochezia. He does not report any frequency, urgency or hesitancy. Does not report any hematuria or dysuria. Does not report  any lymphadenopathy or petechiae. Remaining review of systems unremarkable  Medications: I have reviewed the patient's current medications.   Current Outpatient Prescriptions  Medication Sig Dispense Refill  . abiraterone Acetate (ZYTIGA) 250 MG tablet Take 4 tablets (1,000 mg total) by mouth daily. Take on an empty stomach 1 hour before or 2 hours after a meal 120 tablet 0  . BIOTIN 5000 PO Take 1 tablet by mouth daily.    . Calcium Carbonate-Vitamin D3 (CALCIUM 600+D3) 600-400 MG-UNIT TABS Take 1 tablet by mouth daily.    . Denosumab (XGEVA [place]) Inject into the skin.    . fexofenadine (ALLEGRA) 180 MG tablet Take 180 mg by mouth daily.    . fluticasone (FLONASE) 50 MCG/ACT nasal spray Place 1 spray into both nostrils daily as needed for allergies or rhinitis.    . Glycerin-Polysorbate 80 (REFRESH DRY EYE THERAPY OP) Apply 1 drop to eye daily.    . halobetasol (ULTRAVATE) 0.05 % ointment Apply 1 application topically as needed. For exzema.    . hydrOXYzine (ATARAX/VISTARIL) 10 MG tablet Take 10 mg by mouth every 8 (eight) hours.  4  . lidocaine-prilocaine (EMLA) cream Apply 1 application topically as needed. Apply one teaspoon topically to port 1-2 hours prior to access. 30 g 2  . LORazepam (ATIVAN) 1 MG tablet Take 1 tablet (1 mg total) by mouth every 8 (eight) hours. 90 tablet 0  . Multiple Vitamin (MULTIVITAMIN WITH MINERALS) TABS tablet Take 1 tablet by mouth daily.    . ondansetron (ZOFRAN) 4 MG tablet Take 1 tablet (4 mg total) by mouth every 8 (eight) hours as needed for nausea or vomiting. Philip [name]  tablet 0  . predniSONE (DELTASONE) 5 MG tablet Take 1 tablet (5 mg total) by mouth daily with breakfast. 60 tablet 2  . terbinafine (LAMISIL) 1 % cream Apply 1 application topically 2 (two) times daily.    Marland Kitchen tretinoin (RETIN-A) 0.1 % cream Apply 1 application topically at bedtime.     No current facility-administered medications for this visit.    Facility-Administered Medications Ordered in  Other Visits  Medication Dose Route Frequency Provider Last Rate Last Dose  . alteplase (CATHFLO ACTIVASE) injection 2 mg  2 mg Intracatheter Once PRN [name], MD      . alteplase (CATHFLO ACTIVASE) injection 2 mg  2 mg Intracatheter Once PRN [name], MD         Allergies: No Known Allergies  Past Medical History, Surgical history, Social history, and Family History were reviewed and updated.   Physical Exam: Blood pressure (!) 147/93, pulse 82, temperature 98.1 F (36.7 C), temperature source Oral, resp. rate 17, height 6\' 4"  (1.93 m), weight 205 lb 4.8 oz (93.1 kg), SpO2 100 %.  ECOG: 0 General appearance: well-appearing gentleman appeared without distress. Head: Normocephalic, without obvious abnormality No oral ulcers or lesions. Neck: no adenopathy Lymph nodes: Cervical, supraclavicular, and axillary nodes normal. Heart:regular rate and rhythm, S1, S2 normal, no murmur, click, rub or gallop Lung:chest clear, no wheezing, rales, normal symmetric air entry Abdomin: soft, non-tender, without masses or organomegaly no shifting dullness or ascites. EXT: left leg edema noted above the ankle nonpitting. No swelling in the right lower extremity.      Lab Results: Lab Results  Component Value Date   WBC 5.3 12/15/2016   HGB 12.2 (L) 12/15/2016   HCT 36.2 (L) 12/15/2016   MCV 93.5 12/15/2016   PLT 242 12/15/2016     Chemistry      Component Value Date/Time   NA 138 12/15/2016 1018   K 3.7 12/15/2016 1018   CL 104 02/09/2015 1305   CO2 24 12/15/2016 1018   BUN 13.0 12/15/2016 1018   CREATININE 0.8 12/15/2016 1018      Component Value Date/Time   CALCIUM 9.0 12/15/2016 1018   ALKPHOS 53 12/15/2016 1018   AST 17 12/15/2016 1018   ALT 12 12/15/2016 1018   BILITOT 0.43 12/15/2016 1018      Results for Philip, [name] (MRN [id number]) as of 12/16/2016 15:34  Ref. Range 09/17/2016 11:02 10/22/2016 09:43 12/15/2016 10:18  Prostate Specific Ag, Serum Latest Ref  Range: 0.0 - 4.0 ng/mL 1.2 0.9 0.8        Impression and Plan:  71 year old gentleman with the following issues:  1. Advanced prostate cancer diagnosed in October 2016. He presented with PSA of 2900 and unknown Gleason score. His disease includes widespread bony metastasis as well as pelvic adenopathy. He is status post bilateral orchiectomy done in November 2016.  He is S/P Taxotere at 75 mg/m for a total of 6 cycles that is well-tolerated. His PSA nadir was 5.4.  He started [place] in July 2017 and continues to tolerated very well.   His PSA remains low without any evidence to suggest cancer recurrence. The plan is to continue on Zytiga for the time being and repeat a CT san of the abdomen and pelvis for staging purposes.  2. IV access: Port-A-Cath  in place without complications. This will be flushed every visit.  3. Hypertension: His blood pressure slightly elevated at this time but normally close to normal range.  4. Hormone  therapy: He is status post bilateral orchiectomy.   5. Bone directed therapy: Currently on Xgeva without complications.Complications includes osteonecrosis of the jaw among others were discussed and is agreeable to continue.  6. Left lower extremity edema: he differential diagnosis was discussed today with the patient. This could be related to discontinuation of the prednisone versus the pain thrombosis. Cancer recurrence is a possibility but considered less likely given his low PSA.  The plan is to obtain ultrasound to rule out deep vein thrombosis. I discussed with him the treatment for this condition which include long-term anticoagulation with the help of Xarelto. Complications associated with this therapy was discussed today which include mostly bleeding complications.  7. Anxiety: Unclear etiology at this time could be related to prednisone. He is currently on Ativan and we will consider adding antidepressant in the future.   8. Follow-up: He will also  be in 4 weeks.     Zola Button, MD 10/16/20183:55 PM

## 2016-12-16 NOTE — Telephone Encounter (Signed)
Scheduled appt per 10/16 los - Gave patient AVS and calender per los.  

## 2016-12-17 ENCOUNTER — Telehealth: Payer: Self-pay | Admitting: *Deleted

## 2016-12-17 ENCOUNTER — Other Ambulatory Visit: Payer: Self-pay | Admitting: Oncology

## 2016-12-17 ENCOUNTER — Ambulatory Visit (HOSPITAL_COMMUNITY)
Admission: RE | Admit: 2016-12-17 | Discharge: 2016-12-17 | Disposition: A | Discharge status: Home / Self Care | Payer: Medicare Other | Source: Ambulatory Visit | Attending: Oncology | Admitting: Oncology

## 2016-12-17 DIAGNOSIS — C61 Malignant neoplasm of prostate: Secondary | ICD-10-CM | POA: Insufficient documentation

## 2016-12-17 NOTE — Telephone Encounter (Signed)
Call report from [facility]  Left Lower Venous Extremity Study - Negative.

## 2016-12-17 NOTE — Telephone Encounter (Signed)
Please let Philip [name] know. He is also to restart prednisone 5 mg daily like we discussed.

## 2016-12-17 NOTE — Telephone Encounter (Signed)
Spoke with patient, per dr Alen Blew, he is to take prednisone 5 mg daily. Patient verbalized understanding.

## 2016-12-17 NOTE — Progress Notes (Signed)
VASCULAR LAB PRELIMINARY  PRELIMINARY  PRELIMINARY  PRELIMINARY  Left lower extremity venous duplex completed.    Preliminary report:  Left:  No evidence of DVT, superficial thrombosis, or Baker's cyst.  Philip [name], RVS 12/17/2016, 11:27 AM

## 2016-12-22 ENCOUNTER — Telehealth: Payer: Self-pay

## 2016-12-22 NOTE — Telephone Encounter (Addendum)
Pt called that he usually gets a bone scan and CT scan for his testing. This time there is a CT scan but no bone scan. He is questioning this. He would like to get both done on same day. Explained Dr Alen Blew will be back in office on Wednesday.

## 2016-12-23 NOTE — Telephone Encounter (Signed)
CT scan only for now.

## 2016-12-24 NOTE — Telephone Encounter (Signed)
S/w pt per Dr Alen Blew reply

## 2017-01-05 DIAGNOSIS — N1330 Unspecified hydronephrosis: Secondary | ICD-10-CM | POA: Diagnosis not present

## 2017-01-05 DIAGNOSIS — C7951 Secondary malignant neoplasm of bone: Secondary | ICD-10-CM | POA: Diagnosis not present

## 2017-01-05 DIAGNOSIS — C61 Malignant neoplasm of prostate: Secondary | ICD-10-CM | POA: Diagnosis not present

## 2017-01-07 ENCOUNTER — Encounter: Payer: Self-pay | Admitting: *Deleted

## 2017-01-13 ENCOUNTER — Encounter (HOSPITAL_COMMUNITY): Payer: Self-pay

## 2017-01-13 ENCOUNTER — Ambulatory Visit (HOSPITAL_COMMUNITY)
Admission: RE | Admit: 2017-01-13 | Discharge: 2017-01-13 | Disposition: A | Discharge status: Home / Self Care | Payer: Medicare Other | Source: Ambulatory Visit | Attending: Oncology | Admitting: Oncology

## 2017-01-13 ENCOUNTER — Ambulatory Visit (HOSPITAL_BASED_OUTPATIENT_CLINIC_OR_DEPARTMENT_OTHER): Payer: Medicare Other

## 2017-01-13 ENCOUNTER — Other Ambulatory Visit (HOSPITAL_BASED_OUTPATIENT_CLINIC_OR_DEPARTMENT_OTHER): Payer: Medicare Other

## 2017-01-13 DIAGNOSIS — M5136 Other intervertebral disc degeneration, lumbar region: Secondary | ICD-10-CM | POA: Insufficient documentation

## 2017-01-13 DIAGNOSIS — N281 Cyst of kidney, acquired: Secondary | ICD-10-CM | POA: Diagnosis not present

## 2017-01-13 DIAGNOSIS — I700 Atherosclerosis of aorta: Secondary | ICD-10-CM | POA: Insufficient documentation

## 2017-01-13 DIAGNOSIS — M47896 Other spondylosis, lumbar region: Secondary | ICD-10-CM | POA: Diagnosis not present

## 2017-01-13 DIAGNOSIS — Z95828 Presence of other vascular implants and grafts: Secondary | ICD-10-CM

## 2017-01-13 DIAGNOSIS — C61 Malignant neoplasm of prostate: Secondary | ICD-10-CM | POA: Diagnosis not present

## 2017-01-13 DIAGNOSIS — C7951 Secondary malignant neoplasm of bone: Secondary | ICD-10-CM | POA: Diagnosis not present

## 2017-01-13 LAB — CBC WITH DIFFERENTIAL/PLATELET
BASO%: 0.1 % (ref 0.0–2.0)
Basophils Absolute: 0 10*3/uL (ref 0.0–0.1)
EOS%: 0.4 % (ref 0.0–7.0)
Eosinophils Absolute: 0 10*3/uL (ref 0.0–0.5)
HCT: 39.6 % (ref 38.4–49.9)
HGB: 13 g/dL (ref 13.0–17.1)
LYMPH%: 16.8 % (ref 14.0–49.0)
MCH: 31 pg (ref 27.2–33.4)
MCHC: 32.8 g/dL (ref 32.0–36.0)
MCV: 94.5 fL (ref 79.3–98.0)
MONO#: 0.3 10*3/uL (ref 0.1–0.9)
MONO%: 4 % (ref 0.0–14.0)
NEUT%: 78.7 % — ABNORMAL HIGH (ref 39.0–75.0)
NEUTROS ABS: 5.8 10*3/uL (ref 1.5–6.5)
NRBC: 0 % (ref 0–0)
Platelets: 214 10*3/uL (ref 140–400)
RBC: 4.19 10*6/uL — AB (ref 4.20–5.82)
RDW: 13.2 % (ref 11.0–14.6)
WBC: 7.3 10*3/uL (ref 4.0–10.3)
lymph#: 1.2 10*3/uL (ref 0.9–3.3)

## 2017-01-13 LAB — COMPREHENSIVE METABOLIC PANEL
ALBUMIN: 3.9 g/dL (ref 3.5–5.0)
ALK PHOS: 50 U/L (ref 40–150)
ALT: 13 U/L (ref 0–55)
ANION GAP: 9 meq/L (ref 3–11)
AST: 20 U/L (ref 5–34)
BUN: 14 mg/dL (ref 7.0–26.0)
CALCIUM: 9.5 mg/dL (ref 8.4–10.4)
CO2: 24 mEq/L (ref 22–29)
CREATININE: 0.8 mg/dL (ref 0.7–1.3)
Chloride: 106 mEq/L (ref 98–109)
EGFR: >60 mL/min/{1.73_m2} (ref >60)
Glucose: 95 mg/dl (ref 70–140)
POTASSIUM: 3.5 meq/L (ref 3.5–5.1)
SODIUM: 139 meq/L (ref 136–145)
Total Bilirubin: 0.64 mg/dL (ref 0.20–1.20)
Total Protein: 6.9 g/dL (ref 6.4–8.3)

## 2017-01-13 MED ORDER — IOPAMIDOL (ISOVUE-300) INJECTION 61%
INTRAVENOUS | Status: AC
  Administered 2017-01-13: 100 mL via INTRAVENOUS
  Filled 2017-01-13: qty 100

## 2017-01-13 MED ORDER — SODIUM CHLORIDE 0.9 % IJ SOLN
10.0000 mL | INTRAMUSCULAR | Status: DC | PRN
  Administered 2017-01-13: 10 mL via INTRAVENOUS
  Filled 2017-01-13: qty 10

## 2017-01-13 MED ORDER — HEPARIN SOD (PORK) LOCK FLUSH 100 UNIT/ML IV SOLN
500.0000 [IU] | Freq: Once | INTRAVENOUS | Status: AC
  Administered 2017-01-13: 500 [IU] via INTRAVENOUS

## 2017-01-13 MED ORDER — HEPARIN SOD (PORK) LOCK FLUSH 100 UNIT/ML IV SOLN
INTRAVENOUS | Status: AC
  Filled 2017-01-13: qty 5

## 2017-01-14 LAB — PSA: Prostate Specific Ag, Serum: 1.1 ng/mL (ref 0.0–4.0)

## 2017-01-15 ENCOUNTER — Ambulatory Visit (HOSPITAL_BASED_OUTPATIENT_CLINIC_OR_DEPARTMENT_OTHER): Payer: Medicare Other | Admitting: Oncology

## 2017-01-15 ENCOUNTER — Ambulatory Visit (HOSPITAL_BASED_OUTPATIENT_CLINIC_OR_DEPARTMENT_OTHER): Payer: Medicare Other

## 2017-01-15 ENCOUNTER — Telehealth: Payer: Self-pay | Admitting: Oncology

## 2017-01-15 VITALS — BP 155/81 | HR 79 | Temp 97.8°F | Resp 17 | Ht 76.0 in | Wt 198.1 lb

## 2017-01-15 DIAGNOSIS — C7951 Secondary malignant neoplasm of bone: Secondary | ICD-10-CM

## 2017-01-15 DIAGNOSIS — C61 Malignant neoplasm of prostate: Secondary | ICD-10-CM | POA: Diagnosis not present

## 2017-01-15 DIAGNOSIS — I10 Essential (primary) hypertension: Secondary | ICD-10-CM

## 2017-01-15 MED ORDER — DENOSUMAB 120 MG/1.7ML ~~LOC~~ SOLN
120.0000 mg | Freq: Once | SUBCUTANEOUS | Status: AC
Start: 2017-01-15 — End: 2017-01-15
  Administered 2017-01-15: 120 mg via SUBCUTANEOUS
  Filled 2017-01-15: qty 1.7

## 2017-01-15 MED ORDER — LORAZEPAM 1 MG PO TABS: 1.0000 mg | ORAL_TABLET | Freq: Three times a day (TID) | ORAL | 0 refills | Status: DC

## 2017-01-15 NOTE — Progress Notes (Signed)
Hematology and Oncology Follow Up Visit  Philip [name] [id number] December 11, 1945 71 y.o. 01/15/2017 12:35 PM Patient, No Pcp PerNo ref. provider found   Principle Diagnosis: 71 year old gentleman with advanced prostate cancer diagnosed in October 2016. He presented with a PSA of 2900 and widespread bone metastasis as well as pelvic adenopathy.    Prior Therapy: Status post orchiectomy done on November 2016. Status post port placement on 02/09/2015. Taxotere chemotherapy at 75 mg/m to start on 02/16/2015. He is status post 6 cycles of therapy last cycle received in March 2017.  Current therapy: Zytiga 1000 mg daily with prednisone at 5 mg daily started on 09/13/2015.   Interim History:  Philip [name] presents today for a follow-up visit. Since the last visit, he reports restarting prednisone and feeling well overall.  His lower extremity edema has resolved at this time.  He reports his anxiety is much improved and does take Ativan periodically when he feels anxious and overwhelmed.  His appetite is excellent his performance status remained unchanged.  He denies any new complications related to [facility] including worsening edema or excessive fatigue.   He does not report any headaches, blurry vision, syncope or seizures. He does not report any fevers, chills, sweats or weight loss. He does not report any chest pain, palpitation, orthopnea. Does not report any cough, wheezing, hemoptysis or dyspnea on exertion. He does not report any nausea, vomiting, abdominal pain, hematochezia. He does not report any frequency, urgency or hesitancy. Does not report any hematuria or dysuria. Does not report any lymphadenopathy or petechiae. Remaining review of systems unremarkable  Medications: I have reviewed the patient's current medications.   Current Outpatient Medications  Medication Sig Dispense Refill  . abiraterone Acetate (ZYTIGA) 250 MG tablet Take 4 tablets (1,000 mg total) by mouth daily. Take on an empty  stomach 1 hour before or 2 hours after a meal 120 tablet 0  . BIOTIN 5000 PO Take 1 tablet by mouth daily.    . Calcium Carbonate-Vitamin D3 (CALCIUM 600+D3) 600-400 MG-UNIT TABS Take 1 tablet by mouth daily.    . Denosumab (XGEVA [place]) Inject into the skin.    . fexofenadine (ALLEGRA) 180 MG tablet Take 180 mg by mouth daily.    . fluticasone (FLONASE) 50 MCG/ACT nasal spray Place 1 spray into both nostrils daily as needed for allergies or rhinitis.    . Glycerin-Polysorbate 80 (REFRESH DRY EYE THERAPY OP) Apply 1 drop to eye daily.    . halobetasol (ULTRAVATE) 0.05 % ointment Apply 1 application topically as needed. For exzema.    . hydrOXYzine (ATARAX/VISTARIL) 10 MG tablet Take 10 mg by mouth every 8 (eight) hours.  4  . lidocaine-prilocaine (EMLA) cream Apply 1 application topically as needed. Apply one teaspoon topically to port 1-2 hours prior to access. 30 g 2  . LORazepam (ATIVAN) 1 MG tablet Take 1 tablet (1 mg total) every 8 (eight) hours by mouth. 90 tablet 0  . Multiple Vitamin (MULTIVITAMIN WITH MINERALS) TABS tablet Take 1 tablet by mouth daily.    . ondansetron (ZOFRAN) 4 MG tablet Take 1 tablet (4 mg total) by mouth every 8 (eight) hours as needed for nausea or vomiting. 20 tablet 0  . predniSONE (DELTASONE) 5 MG tablet Take 1 tablet (5 mg total) by mouth daily with breakfast. 60 tablet 2  . terbinafine (LAMISIL) 1 % cream Apply 1 application topically 2 (two) times daily.    Marland Kitchen tretinoin (RETIN-A) 0.1 % cream Apply 1 application topically at bedtime.  No current facility-administered medications for this visit.    Facility-Administered Medications Ordered in Other Visits  Medication Dose Route Frequency Provider Last Rate Last Dose  . alteplase (CATHFLO ACTIVASE) injection 2 mg  2 mg Intracatheter Once PRN [name], MD      . alteplase (CATHFLO ACTIVASE) injection 2 mg  2 mg Intracatheter Once PRN [name], MD      . denosumab (XGEVA) injection 120 mg  120 mg  Subcutaneous Once Shadad, Mathis Dad, MD         Allergies: No Known Allergies  Past Medical History, Surgical history, Social history, and Family History were reviewed and updated.   Physical Exam: Blood pressure (!) 155/81, pulse 79, temperature 97.8 F (36.6 C), temperature source Oral, resp. rate 17, height 6\' 4"  (1.93 m), weight 198 lb 1.6 oz (89.9 kg), SpO2 100 %.  ECOG: 0 General appearance: Alert, awake gentleman without distress. Head: Normocephalic, without obvious abnormality No oral ulcers or lesions. Neck: no adenopathy Lymph nodes: Cervical, supraclavicular, and axillary nodes normal. Heart:regular rate and rhythm, S1, S2 normal, no murmur, click, rub or gallop Lung:chest clear, no wheezing, rales, normal symmetric air entry Abdomin: soft, non-tender, without masses or organomegaly no shifting dullness or ascites. EXT: Slight edema noted on the left.      Lab Results: Lab Results  Component Value Date   WBC 7.3 01/13/2017   HGB 13.0 01/13/2017   HCT 39.6 01/13/2017   MCV 94.5 01/13/2017   PLT 214 01/13/2017     Chemistry      Component Value Date/Time   NA 139 01/13/2017 1034   K 3.5 01/13/2017 1034   CL 104 02/09/2015 1305   CO2 24 01/13/2017 1034   BUN 14.0 01/13/2017 1034   CREATININE 0.8 01/13/2017 1034      Component Value Date/Time   CALCIUM 9.5 01/13/2017 1034   ALKPHOS 50 01/13/2017 1034   AST 20 01/13/2017 1034   ALT 13 01/13/2017 1034   BILITOT 0.64 01/13/2017 1034      Results for Philip [name] (MRN [id number]) as of 01/15/2017 12:37  Ref. Range 12/15/2016 10:18 01/13/2017 10:34  Prostate Specific Ag, Serum Latest Ref Range: 0.0 - 4.0 ng/mL 0.8 1.1    EXAM: CT ABDOMEN AND PELVIS WITH CONTRAST  TECHNIQUE: Multidetector CT imaging of the abdomen and pelvis was performed using the standard protocol following bolus administration of intravenous contrast.  CONTRAST:  126mL ISOVUE-300 IOPAMIDOL (ISOVUE-300) INJECTION  61%  COMPARISON:  Multiple exams, including 03/04/2016 examinations.  FINDINGS: Lower chest: The prior atelectasis in the right lower lobe has resolved.  Hepatobiliary: Stable small hypodense hepatic lesions, likely cysts. Mildly contracted gallbladder.  Pancreas: Unremarkable  Spleen: Unremarkable  Adrenals/Urinary Tract: Adrenal glands normal. Stable small hypodense renal lesions compatible with cysts.  Stomach/Bowel: Redundant sigmoid colon extends up nearly to the level of the diaphragm.  Vascular/Lymphatic: Aortoiliac atherosclerotic vascular disease.  Left external iliac node 1.1 cm in short axis on image 72/2, formerly 1.3 cm.  Right external iliac lymph node 0.7 cm in short axis on image 73/2, formerly 1.0 cm.  Left periaortic lymph node measures 0.8 cm in short axis on image 50/2, formerly 1.0 cm by my measurement.  Reproductive: Overall stable appearance of the prostate gland, with mild vertical prominence but without overt prostatomegaly. Bilateral orchectomy.  Other: No supplemental non-categorized findings.  Musculoskeletal: Distribution and overall appearance of widespread scattered sclerotic osseous metastatic disease not appreciably changed from the 03/04/2016 exam. There is grade  1 anterolisthesis at L3-4 and grade 1 retrolisthesis at L4-5 which in conjunction with lumbar spondylosis and degenerative disc disease contributes to right foraminal impingement at L2- 3 and L3-4, and bilateral foraminal impingement at L4-5 and L5-S1. There appears to be central narrowing of the thecal sac at the L3-4 and L4-5 levels  IMPRESSION: 1. Mildly improved pelvic adenopathy compared to the prior exam. Stable appearance and distribution of scattered widespread osseous metastatic disease, without appreciable bony progression. 2. Redundant sigmoid colon extends nearly to the level of the diaphragm. No complicating feature. 3.  Aortic Atherosclerosis  (ICD10-I70.0). 4. Lumbar spondylosis and degenerative disc disease causing multilevel impingement described above. 5. Small hepatic and renal cysts.    Impression and Plan:  71 year old gentleman with the following issues:  1. Advanced prostate cancer diagnosed in October 2016. He presented with PSA of 2900 and unknown Gleason score. His disease includes widespread bony metastasis as well as pelvic adenopathy. He is status post bilateral orchiectomy done in November 2016.  He is S/P Taxotere at 75 mg/m for a total of 6 cycles that is well-tolerated. His PSA nadir was 5.4.  He started [place] in July 2017 and continues to tolerated very well.   His PSA slightly elevated at 1.1 but overall unchanged.  His CT scan obtained on 01/13/2017 was reviewed and showed no evidence of relapsed disease.  Plan is to continue with Zytiga and prednisone for the time being.  2. IV access: Port-A-Cath  in place without complications. This will be flushed every visit.  3. Hypertension: His blood pressure slightly elevated at this time but normally close to normal range.  4. Hormone therapy: He is status post bilateral orchiectomy.   5. Bone directed therapy: Currently on Xgeva without complications.the risks and benefits of continuing this medication including osteonecrosis of the jaw was reviewed is agreeable to continue.  6. Left lower extremity edema: Resolved at this time.  No thrombosis or malignancy noted on imaging studies.  7. Anxiety: Unclear etiology at this time could be related to prednisone. He is currently on Ativan and we will consider adding antidepressant in the future.   8. Follow-up: He will also be in 4 weeks.     Zola Button, MD 11/15/201812:35 PM

## 2017-01-15 NOTE — Telephone Encounter (Signed)
Gave patient avs and calendar with appts per 11/15 los.  °

## 2017-01-15 NOTE — Patient Instructions (Signed)

## 2017-01-19 ENCOUNTER — Other Ambulatory Visit: Payer: Self-pay | Admitting: *Deleted

## 2017-01-19 DIAGNOSIS — C61 Malignant neoplasm of prostate: Secondary | ICD-10-CM

## 2017-01-19 MED ORDER — ABIRATERONE ACETATE 250 MG PO TABS: 1000.0000 mg | ORAL_TABLET | Freq: Every day | ORAL | 0 refills | Status: DC

## 2017-01-20 ENCOUNTER — Encounter: Payer: Self-pay | Admitting: *Deleted

## 2017-02-10 ENCOUNTER — Other Ambulatory Visit (HOSPITAL_BASED_OUTPATIENT_CLINIC_OR_DEPARTMENT_OTHER): Payer: Medicare Other

## 2017-02-10 ENCOUNTER — Ambulatory Visit (HOSPITAL_BASED_OUTPATIENT_CLINIC_OR_DEPARTMENT_OTHER): Payer: Medicare Other

## 2017-02-10 VITALS — BP 155/87 | HR 83 | Temp 98.2°F | Resp 18

## 2017-02-10 DIAGNOSIS — C61 Malignant neoplasm of prostate: Secondary | ICD-10-CM

## 2017-02-10 DIAGNOSIS — Z95828 Presence of other vascular implants and grafts: Secondary | ICD-10-CM

## 2017-02-10 LAB — CBC WITH DIFFERENTIAL/PLATELET
BASO%: 0.3 % (ref 0.0–2.0)
BASOS ABS: 0 10*3/uL (ref 0.0–0.1)
EOS%: 1.4 % (ref 0.0–7.0)
Eosinophils Absolute: 0.1 10*3/uL (ref 0.0–0.5)
HEMATOCRIT: 37.8 % — AB (ref 38.4–49.9)
HGB: 12.3 g/dL — ABNORMAL LOW (ref 13.0–17.1)
LYMPH#: 2 10*3/uL (ref 0.9–3.3)
LYMPH%: 30.8 % (ref 14.0–49.0)
MCH: 30.4 pg (ref 27.2–33.4)
MCHC: 32.5 g/dL (ref 32.0–36.0)
MCV: 93.6 fL (ref 79.3–98.0)
MONO#: 0.5 10*3/uL (ref 0.1–0.9)
MONO%: 7.9 % (ref 0.0–14.0)
NEUT#: 3.9 10*3/uL (ref 1.5–6.5)
NEUT%: 59.6 % (ref 39.0–75.0)
PLATELETS: 219 10*3/uL (ref 140–400)
RBC: 4.04 10*6/uL — AB (ref 4.20–5.82)
RDW: 13.4 % (ref 11.0–14.6)
WBC: 6.6 10*3/uL (ref 4.0–10.3)

## 2017-02-10 LAB — COMPREHENSIVE METABOLIC PANEL
ALBUMIN: 3.8 g/dL (ref 3.5–5.0)
ALK PHOS: 50 U/L (ref 40–150)
ALT: 15 U/L (ref 0–55)
AST: 20 U/L (ref 5–34)
Anion Gap: 10 mEq/L (ref 3–11)
BILIRUBIN TOTAL: 0.52 mg/dL (ref 0.20–1.20)
BUN: 13.6 mg/dL (ref 7.0–26.0)
CO2: 23 meq/L (ref 22–29)
Calcium: 9.2 mg/dL (ref 8.4–10.4)
Chloride: 106 mEq/L (ref 98–109)
Creatinine: 0.9 mg/dL (ref 0.7–1.3)
EGFR: >60 mL/min/{1.73_m2} (ref >60)
GLUCOSE: 117 mg/dL (ref 70–140)
Potassium: 3.4 mEq/L — ABNORMAL LOW (ref 3.5–5.1)
SODIUM: 139 meq/L (ref 136–145)
TOTAL PROTEIN: 6.7 g/dL (ref 6.4–8.3)

## 2017-02-10 MED ORDER — HEPARIN SOD (PORK) LOCK FLUSH 100 UNIT/ML IV SOLN
500.0000 [IU] | Freq: Once | INTRAVENOUS | Status: AC | PRN
  Administered 2017-02-10: 500 [IU] via INTRAVENOUS
  Filled 2017-02-10: qty 5

## 2017-02-10 MED ORDER — SODIUM CHLORIDE 0.9 % IJ SOLN
10.0000 mL | INTRAMUSCULAR | Status: DC | PRN
  Administered 2017-02-10: 10 mL via INTRAVENOUS
  Filled 2017-02-10: qty 10

## 2017-02-10 NOTE — Patient Instructions (Signed)
Implanted Port Home Guide An implanted port is a type of central line that is placed under the skin. Central lines are used to provide IV access when treatment or nutrition needs to be given through a person's veins. Implanted ports are used for long-term IV access. An implanted port may be placed because:  You need IV medicine that would be irritating to the small veins in your hands or arms.  You need long-term IV medicines, such as antibiotics.  You need IV nutrition for a long period.  You need frequent blood draws for lab tests.  You need dialysis.  Implanted ports are usually placed in the chest area, but they can also be placed in the upper arm, the abdomen, or the leg. An implanted port has two main parts:  Reservoir. The reservoir is round and will appear as a small, raised area under your skin. The reservoir is the part where a needle is inserted to give medicines or draw blood.  Catheter. The catheter is a thin, flexible tube that extends from the reservoir. The catheter is placed into a large vein. Medicine that is inserted into the reservoir goes into the catheter and then into the vein.  How will I care for my incision site? Do not get the incision site wet. Bathe or shower as directed by your health care provider. How is my port accessed? Special steps must be taken to access the port:  Before the port is accessed, a numbing cream can be placed on the skin. This helps numb the skin over the port site.  Your health care provider uses a sterile technique to access the port. ? Your health care provider must put on a mask and sterile gloves. ? The skin over your port is cleaned carefully with an antiseptic and allowed to dry. ? The port is gently pinched between sterile gloves, and a needle is inserted into the port.  Only "non-coring" port needles should be used to access the port. Once the port is accessed, a blood return should be checked. This helps ensure that the port  is in the vein and is not clogged.  If your port needs to remain accessed for a constant infusion, a clear (transparent) bandage will be placed over the needle site. The bandage and needle will need to be changed every week, or as directed by your health care provider.  Keep the bandage covering the needle clean and dry. Do not get it wet. Follow your health care provider's instructions on how to take a shower or bath while the port is accessed.  If your port does not need to stay accessed, no bandage is needed over the port.  What is flushing? Flushing helps keep the port from getting clogged. Follow your health care provider's instructions on how and when to flush the port. Ports are usually flushed with saline solution or a medicine called heparin. The need for flushing will depend on how the port is used.  If the port is used for intermittent medicines or blood draws, the port will need to be flushed: ? After medicines have been given. ? After blood has been drawn. ? As part of routine maintenance.  If a constant infusion is running, the port may not need to be flushed.  How long will my port stay implanted? The port can stay in for as long as your health care provider thinks it is needed. When it is time for the port to come out, surgery will be   done to remove it. The procedure is similar to the one performed when the port was put in. When should I seek immediate medical care? When you have an implanted port, you should seek immediate medical care if:  You notice a bad smell coming from the incision site.  You have swelling, redness, or drainage at the incision site.  You have more swelling or pain at the port site or the surrounding area.  You have a fever that is not controlled with medicine.  This information is not intended to replace advice given to you by your health care provider. Make sure you discuss any questions you have with your health care provider. Document  Released: 02/17/2005 Document Revised: 07/26/2015 Document Reviewed: 10/25/2012 Elsevier Interactive Patient Education  2017 [employer]  

## 2017-02-11 LAB — PSA: Prostate Specific Ag, Serum: 0.8 ng/mL (ref 0.0–4.0)

## 2017-02-12 ENCOUNTER — Ambulatory Visit (HOSPITAL_BASED_OUTPATIENT_CLINIC_OR_DEPARTMENT_OTHER): Payer: Medicare Other | Admitting: Oncology

## 2017-02-12 ENCOUNTER — Ambulatory Visit (HOSPITAL_BASED_OUTPATIENT_CLINIC_OR_DEPARTMENT_OTHER): Payer: Medicare Other

## 2017-02-12 ENCOUNTER — Telehealth: Payer: Self-pay | Admitting: Oncology

## 2017-02-12 VITALS — BP 165/80 | HR 73 | Temp 97.7°F | Resp 18 | Ht 76.0 in | Wt 190.7 lb

## 2017-02-12 DIAGNOSIS — Z95828 Presence of other vascular implants and grafts: Secondary | ICD-10-CM

## 2017-02-12 DIAGNOSIS — C61 Malignant neoplasm of prostate: Secondary | ICD-10-CM

## 2017-02-12 DIAGNOSIS — I10 Essential (primary) hypertension: Secondary | ICD-10-CM | POA: Diagnosis not present

## 2017-02-12 DIAGNOSIS — C7951 Secondary malignant neoplasm of bone: Secondary | ICD-10-CM

## 2017-02-12 MED ORDER — FLUTICASONE PROPIONATE 50 MCG/ACT NA SUSP: 2.0000 | Freq: Every day | NASAL | 2 refills | Status: DC

## 2017-02-12 MED ORDER — PREDNISONE 2.5 MG PO TABS: 2.5000 mg | ORAL_TABLET | ORAL | 3 refills | Status: DC

## 2017-02-12 MED ORDER — DENOSUMAB 120 MG/1.7ML ~~LOC~~ SOLN
SUBCUTANEOUS | Status: AC
  Filled 2017-02-12: qty 1.7

## 2017-02-12 MED ORDER — LORAZEPAM 1 MG PO TABS: 1.0000 mg | ORAL_TABLET | Freq: Three times a day (TID) | ORAL | 0 refills | Status: DC

## 2017-02-12 MED ORDER — DENOSUMAB 120 MG/1.7ML ~~LOC~~ SOLN
120.0000 mg | Freq: Once | SUBCUTANEOUS | Status: AC
  Administered 2017-02-12: 120 mg via SUBCUTANEOUS

## 2017-02-12 NOTE — Patient Instructions (Signed)
Denosumab injection  What is this medicine?  DENOSUMAB (den oh sue mab) slows bone breakdown. Prolia is used to treat osteoporosis in women after menopause and in men. Xgeva is used to prevent bone fractures and other bone problems caused by cancer bone metastases. Xgeva is also used to treat giant cell tumor of the bone.  This medicine may be used for other purposes; ask your health care provider or pharmacist if you have questions.  What should I tell my health care provider before I take this medicine?  They need to know if you have any of these conditions:  -dental disease  -eczema  -infection or history of infections  -kidney disease or on dialysis  -low blood calcium or vitamin D  -malabsorption syndrome  -scheduled to have surgery or tooth extraction  -taking medicine that contains denosumab  -thyroid or parathyroid disease  -an unusual reaction to denosumab, other medicines, foods, dyes, or preservatives  -pregnant or trying to get pregnant  -breast-feeding  How should I use this medicine?  This medicine is for injection under the skin. It is given by a health care professional in a hospital or clinic setting.  If you are getting Prolia, a special MedGuide will be given to you by the pharmacist with each prescription and refill. Be sure to read this information carefully each time.  For Prolia, talk to your pediatrician regarding the use of this medicine in children. Special care may be needed. For Xgeva, talk to your pediatrician regarding the use of this medicine in children. While this drug may be prescribed for children as young as 13 years for selected conditions, precautions do apply.  Overdosage: If you think you have taken too much of this medicine contact a poison control center or emergency room at once.  NOTE: This medicine is only for you. Do not share this medicine with others.  What if I miss a dose?  It is important not to miss your dose. Call your doctor or health care professional if you are  unable to keep an appointment.  What may interact with this medicine?  Do not take this medicine with any of the following medications:  -other medicines containing denosumab  This medicine may also interact with the following medications:  -medicines that suppress the immune system  -medicines that treat cancer  -steroid medicines like prednisone or cortisone  This list may not describe all possible interactions. Give your health care provider a list of all the medicines, herbs, non-prescription drugs, or dietary supplements you use. Also tell them if you smoke, drink alcohol, or use illegal drugs. Some items may interact with your medicine.  What should I watch for while using this medicine?  Visit your doctor or health care professional for regular checks on your progress. Your doctor or health care professional may order blood tests and other tests to see how you are doing.  Call your doctor or health care professional if you get a cold or other infection while receiving this medicine. Do not treat yourself. This medicine may decrease your body's ability to fight infection.  You should make sure you get enough calcium and vitamin D while you are taking this medicine, unless your doctor tells you not to. Discuss the foods you eat and the vitamins you take with your health care professional.  See your dentist regularly. Brush and floss your teeth as directed. Before you have any dental work done, tell your dentist you are receiving this medicine.  Do   not become pregnant while taking this medicine or for 5 months after stopping it. Women should inform their doctor if they wish to become pregnant or think they might be pregnant. There is a potential for serious side effects to an unborn child. Talk to your health care professional or pharmacist for more information.  What side effects may I notice from receiving this medicine?  Side effects that you should report to your doctor or health care professional as soon as  possible:  -allergic reactions like skin rash, itching or hives, swelling of the face, lips, or tongue  -breathing problems  -chest pain  -fast, irregular heartbeat  -feeling faint or lightheaded, falls  -fever, chills, or any other sign of infection  -muscle spasms, tightening, or twitches  -numbness or tingling  -skin blisters or bumps, or is dry, peels, or red  -slow healing or unexplained pain in the mouth or jaw  -unusual bleeding or bruising  Side effects that usually do not require medical attention (Report these to your doctor or health care professional if they continue or are bothersome.):  -muscle pain  -stomach upset, gas  This list may not describe all possible side effects. Call your doctor for medical advice about side effects. You may report side effects to FDA at 1-800-FDA-1088.  Where should I keep my medicine?  This medicine is only given in a clinic, doctor's office, or other health care setting and will not be stored at home.  NOTE: This sheet is a summary. It may not cover all possible information. If you have questions about this medicine, talk to your doctor, pharmacist, or health care provider.      2016, [employer] Standard. (2011-08-18 12:37:47)

## 2017-02-12 NOTE — Telephone Encounter (Signed)
Gave avs and calendar for January 2019 °

## 2017-02-12 NOTE — Progress Notes (Signed)
Hematology and Oncology Follow Up Visit  Philip [name] [id number] Jan 28, 1946 71 y.o. 02/12/2017 4:07 PM Patient, No Pcp PerNo ref. provider found   Principle Diagnosis: 71 year old gentleman with advanced prostate cancer diagnosed in October 2016. He presented with a PSA of 2900 and widespread bone metastasis as well as pelvic adenopathy.    Prior Therapy: Status post orchiectomy done on November 2016. Status post port placement on 02/09/2015. Taxotere chemotherapy at 75 mg/m to start on 02/16/2015. He is status post 6 cycles of therapy last cycle received in March 2017.  Current therapy: Zytiga 1000 mg daily with prednisone at 5 mg daily started on 09/13/2015.   Interim History:  Philip [name] presents today for a follow-up visit. Since the last visit, he reports feeling well overall.  He continues to have issues with anxiety but these complaints are much improved with decreasing amount of prednisone he is taking.  He is currently taking prednisone every other day for better tolerance.  He reports with this approach he is sleeping better and less anxious.  His mood overall has been relatively stable.  He is able to drive and attends to activities of daily living.  He denied any new complications related to Zytiga.  No edema or increase in his blood pressure noted at this time.  He does not report any excessive fatigue or tiredness.  He does not report any pathological fractures or bone pain.  He does report sinus congestion and nasal allergy and he used Flonase over-the-counter that has been less successful.   He does not report any headaches, blurry vision, syncope or seizures. He does not report any fevers, chills, sweats or weight loss. He does not report any chest pain, palpitation, orthopnea. Does not report any cough, wheezing, hemoptysis or dyspnea on exertion. He does not report any nausea, vomiting, abdominal pain, hematochezia. He does not report any frequency, urgency or hesitancy. Does  not report any hematuria or dysuria. Does not report any lymphadenopathy or petechiae. Remaining review of systems unremarkable  Medications: I have reviewed the patient's current medications.   Current Outpatient Medications  Medication Sig Dispense Refill  . abiraterone acetate (ZYTIGA) 250 MG tablet Take 4 tablets (1,000 mg total) daily by mouth. Take on an empty stomach 1 hour before or 2 hours after a meal 120 tablet 0  . BIOTIN 5000 PO Take 1 tablet by mouth daily.    . Calcium Carbonate-Vitamin D3 (CALCIUM 600+D3) 600-400 MG-UNIT TABS Take 1 tablet by mouth daily.    . Denosumab (XGEVA [place]) Inject into the skin.    . fexofenadine (ALLEGRA) 180 MG tablet Take 180 mg by mouth daily.    . fluticasone (FLONASE) 50 MCG/ACT nasal spray Place 1 spray into both nostrils daily as needed for allergies or rhinitis.    . fluticasone (FLONASE) 50 MCG/ACT nasal spray Place 2 sprays into both nostrils daily. 16 g 2  . Glycerin-Polysorbate 80 (REFRESH DRY EYE THERAPY OP) Apply 1 drop to eye daily.    . halobetasol (ULTRAVATE) 0.05 % ointment Apply 1 application topically as needed. For exzema.    . hydrOXYzine (ATARAX/VISTARIL) 10 MG tablet Take 10 mg by mouth every 8 (eight) hours.  4  . lidocaine-prilocaine (EMLA) cream Apply 1 application topically as needed. Apply one teaspoon topically to port 1-2 hours prior to access. 30 g 2  . LORazepam (ATIVAN) 1 MG tablet Take 1 tablet (1 mg total) by mouth every 8 (eight) hours. 90 tablet 0  . Multiple Vitamin (MULTIVITAMIN  WITH MINERALS) TABS tablet Take 1 tablet by mouth daily.    . ondansetron (ZOFRAN) 4 MG tablet Take 1 tablet (4 mg total) by mouth every 8 (eight) hours as needed for nausea or vomiting. 20 tablet 0  . predniSONE (DELTASONE) 2.5 MG tablet Take 1 tablet (2.5 mg total) by mouth every other day. 90 tablet 3  . terbinafine (LAMISIL) 1 % cream Apply 1 application topically 2 (two) times daily.    Marland Kitchen tretinoin (RETIN-A) 0.1 % cream Apply 1  application topically at bedtime.     No current facility-administered medications for this visit.    Facility-Administered Medications Ordered in Other Visits  Medication Dose Route Frequency Provider Last Rate Last Dose  . alteplase (CATHFLO ACTIVASE) injection 2 mg  2 mg Intracatheter Once PRN [name], MD      . alteplase (CATHFLO ACTIVASE) injection 2 mg  2 mg Intracatheter Once PRN [name], MD         Allergies: No Known Allergies  Past Medical History, Surgical history, Social history, and Family History were reviewed and updated.   Physical Exam: Blood pressure (!) 165/80, pulse 73, temperature 97.7 F (36.5 C), temperature source Oral, resp. rate 18, height 6\' 4"  (1.93 m), weight 190 lb 11.2 oz (86.5 kg), SpO2 100 %.  ECOG: 0 General appearance: Well-appearing gentleman appeared comfortable. Head: Normocephalic, without obvious abnormality.  No oral thrush or ulcers. Neck: no adenopathy or masses. Lymph nodes: Cervical, supraclavicular, and axillary nodes normal. Heart:regular rate and rhythm, S1, S2 normal, no murmur, click, rub or gallop Lung:chest clear, no wheezing, rales, normal symmetric air entry Abdomin: soft, non-tender, without masses or organomegaly no shifting dullness or ascites. EXT: 1+ edema more left than right.  Unchanged from previous examination. Logical: No deficits noted.     Lab Results: Lab Results  Component Value Date   WBC 6.6 02/10/2017   HGB 12.3 (L) 02/10/2017   HCT 37.8 (L) 02/10/2017   MCV 93.6 02/10/2017   PLT 219 02/10/2017     Chemistry      Component Value Date/Time   NA 139 02/10/2017 1323   K 3.4 (L) 02/10/2017 1323   CL 104 02/09/2015 1305   CO2 23 02/10/2017 1323   BUN 13.6 02/10/2017 1323   CREATININE 0.9 02/10/2017 1323      Component Value Date/Time   CALCIUM 9.2 02/10/2017 1323   ALKPHOS 50 02/10/2017 1323   AST 20 02/10/2017 1323   ALT 15 02/10/2017 1323   BILITOT 0.52 02/10/2017 1323      Results for Philip [name] (MRN [id number]) as of 02/12/2017 16:12  Ref. Range 12/15/2016 10:18 01/13/2017 10:34 02/10/2017 13:23  Prostate Specific Ag, Serum Latest Ref Range: 0.0 - 4.0 ng/mL 0.8 1.1 0.8    Impression and Plan:  71 year old gentleman with the following issues:  1. Advanced prostate cancer diagnosed in October 2016. He presented with PSA of 2900 and unknown Gleason score. His disease includes widespread bony metastasis as well as pelvic adenopathy. He is status post bilateral orchiectomy done in November 2016.  He is S/P Taxotere at 75 mg/m for a total of 6 cycles that is well-tolerated. His PSA nadir was 5.4.  He started [place] in July 2017 and continues to tolerated very well.   His PSA from 02/10/2017 continue to show excellent response currently at 0.8.  The plan is to continue with the same dose and schedule and monitor his PSA periodically.  2. IV access: Port-A-Cath remains in  use and flushed with every visit.  No complications noted.  3. Hypertension: His blood pressure remains close to normal range outside of his visits.  We will continue to monitor blood pressure on Zytiga.  4. Hormone therapy: He is status post bilateral orchiectomy.   5. Bone directed therapy: Currently on Xgeva without complications.the risks and benefits of continuing this medication including osteonecrosis of the jaw was reviewed is agreeable to continue.   6. Left lower extremity edema: Improved at this time.  7. Anxiety: Unclear etiology at this time could be related to prednisone.  The plan is to continue with slow prednisone taper and switch him to prednisone of 2.5 mg every other day as he is currently taking 5 mg every other day.  He will continue to use the Ativan as needed.  8.  Allergic rhinitis: Prescription for Flonase nasal spray was given to the patient today.  9.  General health maintenance: I recommended he establish care with a primary care physician moving forward.  No  make appropriate referral for him to [facility] care.  10. Follow-up: He will also be in 4 weeks.     Zola Button, MD 12/13/20184:07 PM

## 2017-02-23 ENCOUNTER — Other Ambulatory Visit: Payer: Self-pay | Admitting: *Deleted

## 2017-02-23 DIAGNOSIS — C61 Malignant neoplasm of prostate: Secondary | ICD-10-CM

## 2017-02-23 MED ORDER — ABIRATERONE ACETATE 250 MG PO TABS: 1000.0000 mg | ORAL_TABLET | Freq: Every day | ORAL | 0 refills | Status: DC

## 2017-02-25 ENCOUNTER — Other Ambulatory Visit: Payer: Self-pay | Admitting: *Deleted

## 2017-02-25 DIAGNOSIS — C61 Malignant neoplasm of prostate: Secondary | ICD-10-CM

## 2017-02-25 MED ORDER — ABIRATERONE ACETATE 250 MG PO TABS: 1000.0000 mg | ORAL_TABLET | Freq: Every day | ORAL | 0 refills | Status: DC

## 2017-03-02 ENCOUNTER — Telehealth: Payer: Self-pay | Admitting: Internal Medicine

## 2017-03-02 NOTE — Telephone Encounter (Signed)
yes

## 2017-03-02 NOTE — Telephone Encounter (Signed)
Patient would like to know if Dr. Ronnald Ramp would take him on as a patient.

## 2017-03-04 NOTE — Telephone Encounter (Signed)
Patient scheduled.

## 2017-03-11 ENCOUNTER — Ambulatory Visit (INDEPENDENT_AMBULATORY_CARE_PROVIDER_SITE_OTHER): Payer: Medicare Other | Admitting: Internal Medicine

## 2017-03-11 ENCOUNTER — Other Ambulatory Visit (INDEPENDENT_AMBULATORY_CARE_PROVIDER_SITE_OTHER): Payer: Medicare Other

## 2017-03-11 ENCOUNTER — Encounter: Payer: Self-pay | Admitting: Internal Medicine

## 2017-03-11 VITALS — BP 154/84 | HR 90 | Temp 97.5°F | Resp 16 | Ht 76.0 in | Wt 188.0 lb

## 2017-03-11 DIAGNOSIS — K5904 Chronic idiopathic constipation: Secondary | ICD-10-CM

## 2017-03-11 DIAGNOSIS — I700 Atherosclerosis of aorta: Secondary | ICD-10-CM

## 2017-03-11 DIAGNOSIS — K219 Gastro-esophageal reflux disease without esophagitis: Secondary | ICD-10-CM

## 2017-03-11 DIAGNOSIS — R590 Localized enlarged lymph nodes: Secondary | ICD-10-CM | POA: Diagnosis not present

## 2017-03-11 DIAGNOSIS — D539 Nutritional anemia, unspecified: Secondary | ICD-10-CM | POA: Diagnosis not present

## 2017-03-11 DIAGNOSIS — Z1159 Encounter for screening for other viral diseases: Secondary | ICD-10-CM

## 2017-03-11 DIAGNOSIS — Z23 Encounter for immunization: Secondary | ICD-10-CM | POA: Diagnosis not present

## 2017-03-11 DIAGNOSIS — Z1211 Encounter for screening for malignant neoplasm of colon: Secondary | ICD-10-CM

## 2017-03-11 DIAGNOSIS — J301 Allergic rhinitis due to pollen: Secondary | ICD-10-CM

## 2017-03-11 HISTORY — DX: Chronic idiopathic constipation: K59.04

## 2017-03-11 HISTORY — DX: Gastro-esophageal reflux disease without esophagitis: K21.9

## 2017-03-11 HISTORY — DX: Atherosclerosis of aorta: I70.0

## 2017-03-11 LAB — COMPREHENSIVE METABOLIC PANEL
ALT: 13 U/L (ref 0–53)
AST: 17 U/L (ref 0–37)
Albumin: 4.3 g/dL (ref 3.5–5.2)
Alkaline Phosphatase: 55 U/L (ref 39–117)
BUN: 16 mg/dL (ref 6–23)
CHLORIDE: 102 meq/L (ref 96–112)
CO2: 31 mEq/L (ref 19–32)
Calcium: 9.7 mg/dL (ref 8.4–10.5)
Creatinine, Ser: 0.92 mg/dL (ref 0.40–1.50)
GFR: 85.85 mL/min (ref >60.00)
GLUCOSE: 96 mg/dL (ref 70–99)
POTASSIUM: 3.4 meq/L — AB (ref 3.5–5.1)
SODIUM: 142 meq/L (ref 135–145)
Total Bilirubin: 0.5 mg/dL (ref 0.2–1.2)
Total Protein: 6.9 g/dL (ref 6.0–8.3)

## 2017-03-11 LAB — LIPID PANEL
CHOLESTEROL: 212 mg/dL — AB (ref 0–200)
HDL: 71.1 mg/dL (ref >39.00)
NonHDL: 141.01
TRIGLYCERIDES: 220 mg/dL — AB (ref 0.0–149.0)
Total CHOL/HDL Ratio: 3
VLDL: 44 mg/dL — ABNORMAL HIGH (ref 0.0–40.0)

## 2017-03-11 LAB — CBC WITH DIFFERENTIAL/PLATELET
BASOS PCT: 0.5 % (ref 0.0–3.0)
Basophils Absolute: 0 10*3/uL (ref 0.0–0.1)
EOS ABS: 0.1 10*3/uL (ref 0.0–0.7)
EOS PCT: 1 % (ref 0.0–5.0)
HCT: 40.1 % (ref 39.0–52.0)
Hemoglobin: 13.3 g/dL (ref 13.0–17.0)
LYMPHS ABS: 1.9 10*3/uL (ref 0.7–4.0)
Lymphocytes Relative: 25.2 % (ref 12.0–46.0)
MCHC: 33.1 g/dL (ref 30.0–36.0)
MCV: 92.9 fl (ref 78.0–100.0)
MONO ABS: 0.6 10*3/uL (ref 0.1–1.0)
Monocytes Relative: 7.9 % (ref 3.0–12.0)
NEUTROS PCT: 65.4 % (ref 43.0–77.0)
Neutro Abs: 5 10*3/uL (ref 1.4–7.7)
Platelets: 264 10*3/uL (ref 150.0–400.0)
RBC: 4.32 Mil/uL (ref 4.22–5.81)
RDW: 13.6 % (ref 11.5–15.5)
WBC: 7.6 10*3/uL (ref 4.0–10.5)

## 2017-03-11 LAB — LDL CHOLESTEROL, DIRECT: Direct LDL: 106 mg/dL

## 2017-03-11 LAB — FOLATE: Folate: 23.3 ng/mL (ref >5.9)

## 2017-03-11 LAB — IBC PANEL
IRON: 57 ug/dL (ref 42–165)
SATURATION RATIOS: 14.5 % — AB (ref 20.0–50.0)
TRANSFERRIN: 281 mg/dL (ref 212.0–360.0)

## 2017-03-11 LAB — VITAMIN B12: Vitamin B-12: 558 pg/mL (ref 211–911)

## 2017-03-11 LAB — FERRITIN: Ferritin: 267.5 ng/mL (ref 22.0–322.0)

## 2017-03-11 MED ORDER — FLUTICASONE PROPIONATE 50 MCG/ACT NA SUSP: 2.0000 | Freq: Every day | NASAL | 5 refills | Status: DC

## 2017-03-11 MED ORDER — FLUTICASONE PROPIONATE 50 MCG/ACT NA SUSP: 2.0000 | Freq: Every day | NASAL | 2 refills | Status: DC

## 2017-03-11 MED ORDER — DEXLANSOPRAZOLE 60 MG PO CPDR: 60.0000 mg | DELAYED_RELEASE_CAPSULE | Freq: Every day | ORAL | 5 refills | Status: DC

## 2017-03-11 MED ORDER — LUBIPROSTONE 24 MCG PO CAPS: 24.0000 ug | ORAL_CAPSULE | Freq: Two times a day (BID) | ORAL | 1 refills | Status: DC

## 2017-03-11 NOTE — Progress Notes (Signed)
Subjective:  Patient ID: Philip [name], male    DOB: 1945/09/12  Age: 72 y.o. MRN: [id number]  CC: Anemia and Gastroesophageal Reflux  NEW TO ME  HPI Philip [name] presents for establishing a primary care relationship.  He is currently being treated for metastatic prostate cancer and by his description is having a relatively good response to therapy.  He does not suffer from chronic pain.  He does complain of heartburn that is not controlled with over-the-counter remedies as well as chronic, intermittent constipation.  He has never been screened for colon cancer.  He has a history of anemia but is not aware of any sources of blood loss.  History Philip [name] has a past medical history of Cancer [facility]), Edema leg (Sept 28, 2016), Hypoglycemia, and Swelling (LAST 30 DAYS DR Lena).   He has a past surgical history that includes Gum surgery and Orchiectomy (Bilateral, 01/03/2015).   His family history is not on file.He reports that  has never smoked. he has never used smokeless tobacco. He reports that he drinks about 0.6 oz of alcohol per week. He reports that he does not use drugs.  Outpatient Medications Prior to Visit  Medication Sig Dispense Refill  . abiraterone acetate (ZYTIGA) 250 MG tablet Take 1,000 mg by mouth daily. Take on an empty stomach 1 hour before or 2 hours after a meal    . BIOTIN 5000 PO Take 1 tablet by mouth daily.    . Calcium Carbonate-Vitamin D3 (CALCIUM 600+D3) 600-400 MG-UNIT TABS Take 1 tablet by mouth daily.    . Denosumab (XGEVA [place]) Inject into the skin.    . fexofenadine (ALLEGRA) 180 MG tablet Take 180 mg by mouth daily.    Marland Kitchen lidocaine-prilocaine (EMLA) cream Apply 1 application topically as needed. Apply one teaspoon topically to port 1-2 hours prior to access. 30 g 2  . LORazepam (ATIVAN) 1 MG tablet Take 1 tablet (1 mg total) by mouth every 8 (eight) hours. 90 tablet 0  . predniSONE (DELTASONE) 2.5 MG tablet Take 1 tablet (2.5 mg total) by mouth every other day.  90 tablet 3  . tretinoin (RETIN-A) 0.1 % cream Apply 1 application topically at bedtime.    . fluticasone (FLONASE) 50 MCG/ACT nasal spray Place 2 sprays into both nostrils daily. 16 g 2  . Multiple Vitamin (MULTIVITAMIN WITH MINERALS) TABS tablet Take 1 tablet by mouth daily.    Marland Kitchen abiraterone acetate (ZYTIGA) 250 MG tablet Take 4 tablets (1,000 mg total) by mouth daily. Take on an empty stomach 1 hour before or 2 hours after a meal 120 tablet 0  . fluticasone (FLONASE) 50 MCG/ACT nasal spray Place 1 spray into both nostrils daily as needed for allergies or rhinitis.    . Glycerin-Polysorbate 80 (REFRESH DRY EYE THERAPY OP) Apply 1 drop to eye daily.    . halobetasol (ULTRAVATE) 0.05 % ointment Apply 1 application topically as needed. For exzema.    . hydrOXYzine (ATARAX/VISTARIL) 10 MG tablet Take 10 mg by mouth every 8 (eight) hours.  4  . ondansetron (ZOFRAN) 4 MG tablet Take 1 tablet (4 mg total) by mouth every 8 (eight) hours as needed for nausea or vomiting. 20 tablet 0  . terbinafine (LAMISIL) 1 % cream Apply 1 application topically 2 (two) times daily.     Facility-Administered Medications Prior to Visit  Medication Dose Route Frequency Provider Last Rate Last Dose  . alteplase (CATHFLO ACTIVASE) injection 2 mg  2 mg Intracatheter Once PRN Shadad,  Mathis Dad, MD      . alteplase (CATHFLO ACTIVASE) injection 2 mg  2 mg Intracatheter Once PRN [name], MD        ROS Review of Systems  Constitutional: Positive for appetite change. Negative for diaphoresis, fatigue, fever and unexpected weight change.  HENT: Positive for congestion, postnasal drip and rhinorrhea. Negative for trouble swallowing and voice change.   Eyes: Negative.   Respiratory: Negative for cough, chest tightness, shortness of breath and wheezing.   Cardiovascular: Negative for chest pain, palpitations and leg swelling.  Gastrointestinal: Positive for constipation. Negative for abdominal pain, blood in stool,  diarrhea, nausea and vomiting.  Endocrine: Negative.   Genitourinary: Negative.  Negative for difficulty urinating, hematuria, penile swelling and scrotal swelling.  Musculoskeletal: Negative.  Negative for back pain, myalgias and neck pain.  Skin: Negative for color change and rash.  Allergic/Immunologic: Negative.   Neurological: Negative.  Negative for dizziness, weakness and numbness.  Hematological: Negative for adenopathy. Does not bruise/bleed easily.  Psychiatric/Behavioral: Negative.  Negative for decreased concentration, dysphoric mood, sleep disturbance and suicidal ideas. The patient is not nervous/anxious.     Objective:  BP (!) 154/84 (BP Location: Left Arm, Patient Position: Sitting, Cuff Size: Large)   Pulse 90   Temp (!) 97.5 F (36.4 C) (Oral)   Resp 16   Ht 6\' 4"  (1.93 m)   Wt 188 lb (85.3 kg)   SpO2 96%   BMI 22.88 kg/m   Physical Exam  Constitutional: He is oriented to person, place, and time. No distress.  HENT:  Mouth/Throat: Oropharynx is clear and moist. No oropharyngeal exudate.  Eyes: Conjunctivae are normal. Left eye exhibits no discharge. No scleral icterus.  Neck: Normal range of motion. Neck supple. No JVD present. No thyromegaly present.  Cardiovascular: Normal rate, regular rhythm and normal heart sounds.  No murmur heard. Pulmonary/Chest: Effort normal and breath sounds normal. No respiratory distress. He has no wheezes. He has no rales.  Abdominal: Soft. Bowel sounds are normal. He exhibits no distension and no mass. There is no tenderness.  Musculoskeletal: Normal range of motion. He exhibits no edema, tenderness or deformity.  Lymphadenopathy:    He has no cervical adenopathy.  Neurological: He is alert and oriented to person, place, and time.  Skin: Skin is warm and dry. No rash noted. He is not diaphoretic. No erythema. No pallor.  Psychiatric: He has a normal mood and affect. His behavior is normal. Judgment and thought content normal.    Vitals reviewed.   Lab Results  Component Value Date   WBC 7.6 03/11/2017   HGB 13.3 03/11/2017   HCT 40.1 03/11/2017   PLT 264.0 03/11/2017   GLUCOSE 96 03/11/2017   CHOL 212 (H) 03/11/2017   TRIG 220.0 (H) 03/11/2017   HDL 71.10 03/11/2017   LDLDIRECT 106.0 03/11/2017   ALT 13 03/11/2017   AST 17 03/11/2017   NA 142 03/11/2017   K 3.4 (L) 03/11/2017   CL 102 03/11/2017   CREATININE 0.92 03/11/2017   BUN 16 03/11/2017   CO2 31 03/11/2017   TSH 0.88 03/11/2017   PSA 14.57 (H) 04/20/2015   INR 1.06 02/09/2015    Assessment & Plan:   Ahmet was seen today for anemia and gastroesophageal reflux.  Diagnoses and all orders for this visit:  Atherosclerosis of aorta ([place]) - He has an elevated ASCVD risk score but is not willing to take a statin or asa. -     Cancel: Lipid  panel; Future -     Lipid panel; Future  Lymphadenopathy, inguinal  Need for hepatitis C screening test -     Hepatitis C antibody; Future  Need for pneumococcal vaccination -     Pneumococcal conjugate vaccine 13-valent  Colon cancer screening -     Cologuard  Deficiency anemia- His H&H are normal now.  Will monitor for vitamin deficiencies. -     CBC with Differential/Platelet; Future -     Vitamin B12; Future -     IBC panel; Future -     Folate; Future -     Ferritin; Future -     Vitamin B1; Future  Chronic idiopathic constipation- He takes no medications that would cause constipation.  His labs are negative for any secondary or organic causes.  Will treat this with Amitiza. -     Comprehensive metabolic panel; Future -     Thyroid Panel With TSH; Future -     lubiprostone (AMITIZA) 24 MCG capsule; Take 1 capsule (24 mcg total) by mouth 2 (two) times daily with a meal.  Seasonal allergic rhinitis due to pollen -     fluticasone (FLONASE) 50 MCG/ACT nasal spray; Place 2 sprays into both nostrils daily.  Gastroesophageal reflux disease without esophagitis- He has no alarmimg symptoms.   Will start a PPI for symptom relief. -     dexlansoprazole (DEXILANT) 60 MG capsule; Take 1 capsule (60 mg total) by mouth daily.  Other orders -     Discontinue: fluticasone (FLONASE) 50 MCG/ACT nasal spray; Place 2 sprays into both nostrils daily.   I have discontinued Pilar Plate Mckiver's hydrOXYzine, multivitamin with minerals, Glycerin-Polysorbate 80 (REFRESH DRY EYE THERAPY OP), terbinafine, halobetasol, and ondansetron. I am also having him start on lubiprostone and dexlansoprazole. Additionally, I am having him maintain his fexofenadine, Calcium Carbonate-Vitamin D3, tretinoin, BIOTIN 5000 PO, Denosumab (XGEVA [place]), lidocaine-prilocaine, LORazepam, predniSONE, abiraterone acetate, and fluticasone.  Meds ordered this encounter  Medications  . DISCONTD: fluticasone (FLONASE) 50 MCG/ACT nasal spray    Sig: Place 2 sprays into both nostrils daily.    Dispense:  16 g    Refill:  2  . lubiprostone (AMITIZA) 24 MCG capsule    Sig: Take 1 capsule (24 mcg total) by mouth 2 (two) times daily with a meal.    Dispense:  180 capsule    Refill:  1  . fluticasone (FLONASE) 50 MCG/ACT nasal spray    Sig: Place 2 sprays into both nostrils daily.    Dispense:  16 g    Refill:  5  . dexlansoprazole (DEXILANT) 60 MG capsule    Sig: Take 1 capsule (60 mg total) by mouth daily.    Dispense:  30 capsule    Refill:  5     Follow-up: Return in about 6 months (around 09/08/2017).  Scarlette Calico, MD

## 2017-03-11 NOTE — Patient Instructions (Signed)
Anemia Anemia is a condition in which you do not have enough red blood cells or hemoglobin. Hemoglobin is a substance in red blood cells that carries oxygen. When you do not have enough red blood cells or hemoglobin (are anemic), your body cannot get enough oxygen and your organs may not work properly. As a result, you may feel very tired or have other problems. What are the causes? Common causes of anemia include:  Excessive bleeding. Anemia can be caused by excessive bleeding inside or outside the body, including bleeding from the intestine or from periods in women.  Poor nutrition.  Long-lasting (chronic) kidney, thyroid, and liver disease.  Bone marrow disorders.  Cancer and treatments for cancer.  HIV (human immunodeficiency virus) and AIDS (acquired immunodeficiency syndrome).  Treatments for HIV and AIDS.  Spleen problems.  Blood disorders.  Infections, medicines, and autoimmune disorders that destroy red blood cells.  What are the signs or symptoms? Symptoms of this condition include:  Minor weakness.  Dizziness.  Headache.  Feeling heartbeats that are irregular or faster than normal (palpitations).  Shortness of breath, especially with exercise.  Paleness.  Cold sensitivity.  Indigestion.  Nausea.  Difficulty sleeping.  Difficulty concentrating.  Symptoms may occur suddenly or develop slowly. If your anemia is mild, you may not have symptoms. How is this diagnosed? This condition is diagnosed based on:  Blood tests.  Your medical history.  A physical exam.  Bone marrow biopsy.  Your health care provider may also check your stool (feces) for blood and may do additional testing to look for the cause of your bleeding. You may also have other tests, including:  Imaging tests, such as a CT scan or MRI.  Endoscopy.  Colonoscopy.  How is this treated? Treatment for this condition depends on the cause. If you continue to lose a lot of blood,  you may need to be treated at a hospital. Treatment may include:  Taking supplements of iron, vitamin Z32, or folic acid.  Taking a hormone medicine (erythropoietin) that can help to stimulate red blood cell growth.  Having a blood transfusion. This may be needed if you lose a lot of blood.  Making changes to your diet.  Having surgery to remove your spleen.  Follow these instructions at home:  Take over-the-counter and prescription medicines only as told by your health care provider.  Take supplements only as told by your health care provider.  Follow any diet instructions that you were given.  Keep all follow-up visits as told by your health care provider. This is important. Contact a health care provider if:  You develop new bleeding anywhere in the body. Get help right away if:  You are very weak.  You are short of breath.  You have pain in your abdomen or chest.  You are dizzy or feel faint.  You have trouble concentrating.  You have bloody or black, tarry stools.  You vomit repeatedly or you vomit up blood. Summary  Anemia is a condition in which you do not have enough red blood cells or enough of a substance in your red blood cells that carries oxygen (hemoglobin).  Symptoms may occur suddenly or develop slowly.  If your anemia is mild, you may not have symptoms.  This condition is diagnosed with blood tests as well as a medical history and physical exam. Other tests may be needed.  Treatment for this condition depends on the cause of the anemia. This information is not intended to replace advice  given to you by your health care provider. Make sure you discuss any questions you have with your health care provider. Document Released: 03/27/2004 Document Revised: 03/21/2016 Document Reviewed: 03/21/2016 Elsevier Interactive Patient Education  [employer].

## 2017-03-12 ENCOUNTER — Encounter: Payer: Self-pay | Admitting: Internal Medicine

## 2017-03-16 ENCOUNTER — Other Ambulatory Visit: Payer: Self-pay | Admitting: Internal Medicine

## 2017-03-16 DIAGNOSIS — I700 Atherosclerosis of aorta: Secondary | ICD-10-CM

## 2017-03-16 DIAGNOSIS — E785 Hyperlipidemia, unspecified: Secondary | ICD-10-CM

## 2017-03-16 HISTORY — DX: Hyperlipidemia, unspecified: E78.5

## 2017-03-16 LAB — THYROID PANEL WITH TSH
Free Thyroxine Index: 2.9 (ref 1.4–3.8)
T3 Uptake: 29 % (ref 22–35)
T4, Total: 10.1 ug/dL (ref 4.9–10.5)
TSH: 0.88 mIU/L (ref 0.40–4.50)

## 2017-03-16 LAB — HEPATITIS C ANTIBODY
Hepatitis C Ab: NONREACTIVE
SIGNAL TO CUT-OFF: 0.01 (ref <1.00)

## 2017-03-16 LAB — VITAMIN B1: Vitamin B1 (Thiamine): 27 nmol/L (ref 8–30)

## 2017-03-16 MED ORDER — ATORVASTATIN CALCIUM 20 MG PO TABS: 20.0000 mg | ORAL_TABLET | Freq: Every day | ORAL | 1 refills | Status: DC

## 2017-03-17 ENCOUNTER — Other Ambulatory Visit: Payer: Self-pay | Admitting: Internal Medicine

## 2017-03-17 DIAGNOSIS — Z1211 Encounter for screening for malignant neoplasm of colon: Secondary | ICD-10-CM | POA: Diagnosis not present

## 2017-03-17 DIAGNOSIS — E785 Hyperlipidemia, unspecified: Secondary | ICD-10-CM

## 2017-03-17 DIAGNOSIS — I700 Atherosclerosis of aorta: Secondary | ICD-10-CM

## 2017-03-17 LAB — COLOGUARD: Cologuard: NEGATIVE

## 2017-03-17 MED ORDER — ATORVASTATIN CALCIUM 10 MG PO TABS: 10.0000 mg | ORAL_TABLET | Freq: Every day | ORAL | 1 refills | Status: DC

## 2017-03-18 ENCOUNTER — Other Ambulatory Visit: Payer: Self-pay | Admitting: *Deleted

## 2017-03-18 ENCOUNTER — Inpatient Hospital Stay: Payer: Medicare Other | Attending: Oncology

## 2017-03-18 ENCOUNTER — Inpatient Hospital Stay: Payer: Medicare Other

## 2017-03-18 ENCOUNTER — Encounter: Payer: Self-pay | Admitting: *Deleted

## 2017-03-18 VITALS — BP 150/86 | HR 79 | Temp 97.9°F | Resp 18

## 2017-03-18 DIAGNOSIS — M810 Age-related osteoporosis without current pathological fracture: Secondary | ICD-10-CM | POA: Diagnosis not present

## 2017-03-18 DIAGNOSIS — R600 Localized edema: Secondary | ICD-10-CM | POA: Insufficient documentation

## 2017-03-18 DIAGNOSIS — C61 Malignant neoplasm of prostate: Secondary | ICD-10-CM

## 2017-03-18 DIAGNOSIS — Z95828 Presence of other vascular implants and grafts: Secondary | ICD-10-CM

## 2017-03-18 DIAGNOSIS — C779 Secondary and unspecified malignant neoplasm of lymph node, unspecified: Secondary | ICD-10-CM | POA: Diagnosis not present

## 2017-03-18 DIAGNOSIS — C7951 Secondary malignant neoplasm of bone: Secondary | ICD-10-CM | POA: Diagnosis not present

## 2017-03-18 LAB — CMP (CANCER CENTER ONLY)
ALBUMIN: 3.7 g/dL (ref 3.5–5.0)
ALT: 13 U/L (ref 0–55)
ANION GAP: 8 (ref 3–11)
AST: 18 U/L (ref 5–34)
Alkaline Phosphatase: 61 U/L (ref 40–150)
BUN: 15 mg/dL (ref 7–26)
CHLORIDE: 107 mmol/L (ref 98–109)
CO2: 28 mmol/L (ref 22–29)
Calcium: 9 mg/dL (ref 8.4–10.4)
Creatinine: 0.91 mg/dL (ref 0.70–1.30)
GFR, Est AFR Am: >60 mL/min (ref >60)
GFR, Estimated: >60 mL/min (ref >60)
GLUCOSE: 96 mg/dL (ref 70–140)
POTASSIUM: 3.5 mmol/L (ref 3.5–5.1)
Sodium: 143 mmol/L (ref 136–145)
Total Bilirubin: 0.3 mg/dL (ref 0.2–1.2)
Total Protein: 6.5 g/dL (ref 6.4–8.3)

## 2017-03-18 MED ORDER — SODIUM CHLORIDE 0.9 % IJ SOLN
10.0000 mL | INTRAMUSCULAR | Status: DC | PRN
  Administered 2017-03-18: 10 mL via INTRAVENOUS
  Filled 2017-03-18: qty 10

## 2017-03-18 MED ORDER — HEPARIN SOD (PORK) LOCK FLUSH 100 UNIT/ML IV SOLN
500.0000 [IU] | Freq: Once | INTRAVENOUS | Status: AC | PRN
  Administered 2017-03-18: 500 [IU] via INTRAVENOUS
  Filled 2017-03-18: qty 5

## 2017-03-18 NOTE — Patient Instructions (Signed)
Implanted Port Home Guide An implanted port is a type of central line that is placed under the skin. Central lines are used to provide IV access when treatment or nutrition needs to be given through a person's veins. Implanted ports are used for long-term IV access. An implanted port may be placed because:  You need IV medicine that would be irritating to the small veins in your hands or arms.  You need long-term IV medicines, such as antibiotics.  You need IV nutrition for a long period.  You need frequent blood draws for lab tests.  You need dialysis.  Implanted ports are usually placed in the chest area, but they can also be placed in the upper arm, the abdomen, or the leg. An implanted port has two main parts:  Reservoir. The reservoir is round and will appear as a small, raised area under your skin. The reservoir is the part where a needle is inserted to give medicines or draw blood.  Catheter. The catheter is a thin, flexible tube that extends from the reservoir. The catheter is placed into a large vein. Medicine that is inserted into the reservoir goes into the catheter and then into the vein.  How will I care for my incision site? Do not get the incision site wet. Bathe or shower as directed by your health care provider. How is my port accessed? Special steps must be taken to access the port:  Before the port is accessed, a numbing cream can be placed on the skin. This helps numb the skin over the port site.  Your health care provider uses a sterile technique to access the port. ? Your health care provider must put on a mask and sterile gloves. ? The skin over your port is cleaned carefully with an antiseptic and allowed to dry. ? The port is gently pinched between sterile gloves, and a needle is inserted into the port.  Only "non-coring" port needles should be used to access the port. Once the port is accessed, a blood return should be checked. This helps ensure that the port  is in the vein and is not clogged.  If your port needs to remain accessed for a constant infusion, a clear (transparent) bandage will be placed over the needle site. The bandage and needle will need to be changed every week, or as directed by your health care provider.  Keep the bandage covering the needle clean and dry. Do not get it wet. Follow your health care provider's instructions on how to take a shower or bath while the port is accessed.  If your port does not need to stay accessed, no bandage is needed over the port.  What is flushing? Flushing helps keep the port from getting clogged. Follow your health care provider's instructions on how and when to flush the port. Ports are usually flushed with saline solution or a medicine called heparin. The need for flushing will depend on how the port is used.  If the port is used for intermittent medicines or blood draws, the port will need to be flushed: ? After medicines have been given. ? After blood has been drawn. ? As part of routine maintenance.  If a constant infusion is running, the port may not need to be flushed.  How long will my port stay implanted? The port can stay in for as long as your health care provider thinks it is needed. When it is time for the port to come out, surgery will be   done to remove it. The procedure is similar to the one performed when the port was put in. When should I seek immediate medical care? When you have an implanted port, you should seek immediate medical care if:  You notice a bad smell coming from the incision site.  You have swelling, redness, or drainage at the incision site.  You have more swelling or pain at the port site or the surrounding area.  You have a fever that is not controlled with medicine.  This information is not intended to replace advice given to you by your health care provider. Make sure you discuss any questions you have with your health care provider. Document  Released: 02/17/2005 Document Revised: 07/26/2015 Document Reviewed: 10/25/2012 Elsevier Interactive Patient Education  2017 [employer]  

## 2017-03-19 LAB — PROSTATE-SPECIFIC AG, SERUM (LABCORP): Prostate Specific Ag, Serum: 0.8 ng/mL (ref 0.0–4.0)

## 2017-03-20 ENCOUNTER — Inpatient Hospital Stay (HOSPITAL_BASED_OUTPATIENT_CLINIC_OR_DEPARTMENT_OTHER): Payer: Medicare Other | Admitting: Oncology

## 2017-03-20 ENCOUNTER — Telehealth: Payer: Self-pay | Admitting: Oncology

## 2017-03-20 ENCOUNTER — Inpatient Hospital Stay: Payer: Medicare Other

## 2017-03-20 VITALS — BP 153/85 | HR 77 | Temp 97.7°F | Resp 18 | Ht 76.0 in | Wt 189.9 lb

## 2017-03-20 DIAGNOSIS — C61 Malignant neoplasm of prostate: Secondary | ICD-10-CM

## 2017-03-20 DIAGNOSIS — R600 Localized edema: Secondary | ICD-10-CM | POA: Diagnosis not present

## 2017-03-20 DIAGNOSIS — C779 Secondary and unspecified malignant neoplasm of lymph node, unspecified: Secondary | ICD-10-CM

## 2017-03-20 DIAGNOSIS — M810 Age-related osteoporosis without current pathological fracture: Secondary | ICD-10-CM

## 2017-03-20 DIAGNOSIS — C7951 Secondary malignant neoplasm of bone: Secondary | ICD-10-CM | POA: Diagnosis not present

## 2017-03-20 MED ORDER — DENOSUMAB 120 MG/1.7ML ~~LOC~~ SOLN
120.0000 mg | Freq: Once | SUBCUTANEOUS | Status: AC
  Administered 2017-03-20: 120 mg via SUBCUTANEOUS

## 2017-03-20 NOTE — Telephone Encounter (Signed)
Gave patient avs and calendar with appts per 11/8 los.  °

## 2017-03-20 NOTE — Patient Instructions (Signed)
Denosumab injection  What is this medicine?  DENOSUMAB (den oh sue mab) slows bone breakdown. Prolia is used to treat osteoporosis in women after menopause and in men. Xgeva is used to prevent bone fractures and other bone problems caused by cancer bone metastases. Xgeva is also used to treat giant cell tumor of the bone.  This medicine may be used for other purposes; ask your health care provider or pharmacist if you have questions.  What should I tell my health care provider before I take this medicine?  They need to know if you have any of these conditions:  -dental disease  -eczema  -infection or history of infections  -kidney disease or on dialysis  -low blood calcium or vitamin D  -malabsorption syndrome  -scheduled to have surgery or tooth extraction  -taking medicine that contains denosumab  -thyroid or parathyroid disease  -an unusual reaction to denosumab, other medicines, foods, dyes, or preservatives  -pregnant or trying to get pregnant  -breast-feeding  How should I use this medicine?  This medicine is for injection under the skin. It is given by a health care professional in a hospital or clinic setting.  If you are getting Prolia, a special MedGuide will be given to you by the pharmacist with each prescription and refill. Be sure to read this information carefully each time.  For Prolia, talk to your pediatrician regarding the use of this medicine in children. Special care may be needed. For Xgeva, talk to your pediatrician regarding the use of this medicine in children. While this drug may be prescribed for children as young as 13 years for selected conditions, precautions do apply.  Overdosage: If you think you have taken too much of this medicine contact a poison control center or emergency room at once.  NOTE: This medicine is only for you. Do not share this medicine with others.  What if I miss a dose?  It is important not to miss your dose. Call your doctor or health care professional if you are  unable to keep an appointment.  What may interact with this medicine?  Do not take this medicine with any of the following medications:  -other medicines containing denosumab  This medicine may also interact with the following medications:  -medicines that suppress the immune system  -medicines that treat cancer  -steroid medicines like prednisone or cortisone  This list may not describe all possible interactions. Give your health care provider a list of all the medicines, herbs, non-prescription drugs, or dietary supplements you use. Also tell them if you smoke, drink alcohol, or use illegal drugs. Some items may interact with your medicine.  What should I watch for while using this medicine?  Visit your doctor or health care professional for regular checks on your progress. Your doctor or health care professional may order blood tests and other tests to see how you are doing.  Call your doctor or health care professional if you get a cold or other infection while receiving this medicine. Do not treat yourself. This medicine may decrease your body's ability to fight infection.  You should make sure you get enough calcium and vitamin D while you are taking this medicine, unless your doctor tells you not to. Discuss the foods you eat and the vitamins you take with your health care professional.  See your dentist regularly. Brush and floss your teeth as directed. Before you have any dental work done, tell your dentist you are receiving this medicine.  Do   not become pregnant while taking this medicine or for 5 months after stopping it. Women should inform their doctor if they wish to become pregnant or think they might be pregnant. There is a potential for serious side effects to an unborn child. Talk to your health care professional or pharmacist for more information.  What side effects may I notice from receiving this medicine?  Side effects that you should report to your doctor or health care professional as soon as  possible:  -allergic reactions like skin rash, itching or hives, swelling of the face, lips, or tongue  -breathing problems  -chest pain  -fast, irregular heartbeat  -feeling faint or lightheaded, falls  -fever, chills, or any other sign of infection  -muscle spasms, tightening, or twitches  -numbness or tingling  -skin blisters or bumps, or is dry, peels, or red  -slow healing or unexplained pain in the mouth or jaw  -unusual bleeding or bruising  Side effects that usually do not require medical attention (Report these to your doctor or health care professional if they continue or are bothersome.):  -muscle pain  -stomach upset, gas  This list may not describe all possible side effects. Call your doctor for medical advice about side effects. You may report side effects to FDA at 1-800-FDA-1088.  Where should I keep my medicine?  This medicine is only given in a clinic, doctor's office, or other health care setting and will not be stored at home.  NOTE: This sheet is a summary. It may not cover all possible information. If you have questions about this medicine, talk to your doctor, pharmacist, or health care provider.      2016, [employer] Standard. (2011-08-18 12:37:47)

## 2017-03-20 NOTE — Progress Notes (Signed)
Hematology and Oncology Follow Up Visit  Philip [name] [id number] [birth year] 72 y.o. 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 He presented with a PSA of 2900 and widespread bone metastasis with lymph node involvement.   Prior Therapy: Status post orchiectomy done on November 2016.  Taxotere chemotherapy at 75 mg/m to start on 02/16/2015. He is status post 6 cycles of therapy last cycle received in March 2017.  Current therapy: Zytiga 1000 mg daily with prednisone at 5 mg daily started on 09/13/2015.  He is currently on tapering doses of prednisone.  Interim History:  Philip [name] is here by himself for a follow-up.  He continues to report improvement in his mood and anxiety as he tapers down prednisone use.  He is currently taking prednisone every other day and on the days he takes prednisone he reports more satiety and agitation.  He does require Ativan at times to help with his anxiety.  Despite the symptoms, he continues to be productive and performs activities of daily living as well as work-related duties.  He did establish care with Dr. Ronnald [name] has a primary care physician.  He was started on cholesterol medication since the last visit.  He denied any new complications related to Zytiga.  He does not report any pathological fractures, bone pain or worsening edema.  He denied any nausea or GI complaints.  His appetite remain excellent excellent quality of life.  He does not report any headaches, blurry vision, syncope or seizures. He does not report any fevers, chills, sweats or weight loss. He does not report any chest pain, palpitation, orthopnea. Does not report any cough, wheezing, hemoptysis or dyspnea on exertion. He does not report any nausea, vomiting, abdominal pain, hematochezia. He does not report any frequency, urgency or hesitancy. Does not report any  hematuria or dysuria. Does not report any lymphadenopathy or petechiae.  He does not report any heat or cold intolerance.  He does not report any skin rashes or lesions.  Remaining review of systems unremarkable  Medications: I have reviewed the patient's current medications.   Current Outpatient Medications  Medication Sig Dispense Refill  . abiraterone acetate (ZYTIGA) 250 MG tablet Take 1,000 mg by mouth daily. Take on an empty stomach 1 hour before or 2 hours after a meal    . aspirin EC 81 MG tablet Take 81 mg by mouth daily.    Marland Kitchen atorvastatin (LIPITOR) 10 MG tablet Take 1 tablet (10 mg total) by mouth daily. 90 tablet 1  . BIOTIN 5000 PO Take 1 tablet by mouth daily.    . Calcium Carbonate-Vitamin D3 (CALCIUM 600+D3) 600-400 MG-UNIT TABS Take 1 tablet by mouth daily.    . Denosumab (XGEVA [place]) Inject into the skin.    . fexofenadine (ALLEGRA) 180 MG tablet Take 180 mg by mouth daily.    . fluticasone (FLONASE) 50 MCG/ACT nasal spray Place 2 sprays into both nostrils daily. 16 g 5  . lidocaine-prilocaine (EMLA) cream Apply 1 application topically as needed. Apply one teaspoon topically to port 1-2 hours prior to access. 30 g 2  . LORazepam (ATIVAN) 1 MG tablet Take 1 tablet (1 mg total) by mouth every 8 (eight) hours. 90 tablet 0  . predniSONE (DELTASONE) 2.5 MG tablet Take 1 tablet (2.5 mg total) by mouth every other day. 90 tablet 3  . tretinoin (RETIN-A) 0.1 % cream Apply 1 application topically  at bedtime.     No current facility-administered medications for this visit.    Facility-Administered Medications Ordered in Other Visits  Medication Dose Route Frequency Provider Last Rate Last Dose  . alteplase (CATHFLO ACTIVASE) injection 2 mg  2 mg Intracatheter Once PRN Philip Portela, MD      . alteplase (CATHFLO ACTIVASE) injection 2 mg  2 mg Intracatheter Once PRN Philip Portela, MD         Allergies: No Known Allergies  Past Medical History, Surgical history, Social history, and  Family History were reviewed and updated.   Physical Exam: Blood pressure (!) 153/85, pulse 77, temperature 97.7 F (36.5 C), temperature source Oral, resp. rate 18, height 6\' 4"  (1.93 m), weight 189 lb 14.4 oz (86.1 kg), SpO2 100 %.  ECOG: 0 General appearance: Alert, awake gentleman appeared without distress. Head: Normocephalic, without obvious abnormality.   Oropharynx: Without ulcers or lesions. Eyes: No scleral icterus.  Pupils are equal and round reactive to light. Lymph nodes: Cervical, supraclavicular, and axillary nodes normal. Heart:regular rate and rhythm, S1, S2 normal, no murmur, click, rub or gallop Lung:chest clear, no wheezing, rales, normal symmetric air entry Abdomin: soft, non-tender, without masses or organomegaly no shifting dullness or ascites. Musculoskeletal: No joint deformity, effusion or worsening edema. Neurological: No deficits noted. Skin: No rashes or lesions. Psychiatric: Mood is appropriate with appropriate affect.     Lab Results: Lab Results  Component Value Date   WBC 7.6 03/11/2017   HGB 13.3 03/11/2017   HCT 40.1 03/11/2017   MCV 92.9 03/11/2017   PLT 264.0 03/11/2017     Chemistry      Component Value Date/Time   NA 143 03/18/2017 1430   NA 139 02/10/2017 1323   K 3.5 03/18/2017 1430   K 3.4 (L) 02/10/2017 1323   CL 107 03/18/2017 1430   CO2 28 03/18/2017 1430   CO2 23 02/10/2017 1323   BUN 15 03/18/2017 1430   BUN 13.6 02/10/2017 1323   CREATININE 0.92 03/11/2017 1420   CREATININE 0.9 02/10/2017 1323      Component Value Date/Time   CALCIUM 9.0 03/18/2017 1430   CALCIUM 9.2 02/10/2017 1323   ALKPHOS 61 03/18/2017 1430   ALKPHOS 50 02/10/2017 1323   AST 18 03/18/2017 1430   AST 20 02/10/2017 1323   ALT 13 03/18/2017 1430   ALT 15 02/10/2017 1323   BILITOT 0.3 03/18/2017 1430   BILITOT 0.52 02/10/2017 1323     Results for Philip [name] (MRN [id number]) as of 03/20/2017 15:44  Ref. Range 01/13/2017 10:34 02/10/2017 13:23  03/18/2017 14:07  Prostate Specific Ag, Serum Latest Ref Range: 0.0 - 4.0 ng/mL 1.1 0.8 0.8     Impression and Plan:  72 year old gentleman with the following issues:  1.  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  He was originally diagnosed in October 2016. He presented with PSA of 2900 and unknown Gleason score.  He is S/P Taxotere at 75 mg/m for a total of 6 cycles that is well-tolerated. His PSA nadir was 5.4.  He started [place] in July 2017 with continued excellent response in his PSA.  His PSA on March 18, 2017 was reviewed today and continues to show excellent control.  His PSA is 0.8 is unchanged from December 2018 and down from November 2018.  Overall his disease status continues to be excellent without any evidence to suggest progression of disease.  Risks and benefits of continuing Zytiga  was reviewed today and he is agreeable to continue.  2. IV access: Port-A-Cath is flushed periodically without any complications.  3. Hypertension: His blood pressure is within normal range without any need for antihypertensive medications on Zytiga.  4. Hormone therapy: He is status post orchiectomy and does not require androgen deprivation therapy pharmacologically.  5.  Osteoporosis and risk of pathological fractures: He has metastatic prostate cancer to the bone and at risk of developing neurological fractures.  Is currently on Xgeva to prevent skeletal related events.  Risks and benefits of continuing this medication was reviewed today and is agreeable to continue.  Complications such as hypocalcemia and osteonecrosis of the jaw were reviewed.  6. Left lower extremity edema: Related to his prostate cancer metastasis.  Under control at this time.  7. Anxiety: Related to prednisone has improved with the current prednisone taper.  I have asked him to continue his prednisone taper for the time being and he will take 2.5 mg twice a week  for total 3 doses then he will stop.  8. Follow-up: He will also be in 4 weeks.   25 minutes was spent with the patient face-to-face today.  More than 50% of time was dedicated to patient counseling, education and coordination his multifaceted care.     Zola Button, MD 1/18/20193:37 PM

## 2017-04-03 ENCOUNTER — Encounter: Payer: Self-pay | Admitting: Internal Medicine

## 2017-04-03 NOTE — Progress Notes (Signed)
Results abstracted and sent to scan.  

## 2017-04-22 ENCOUNTER — Inpatient Hospital Stay: Payer: Medicare Other | Attending: Oncology

## 2017-04-22 ENCOUNTER — Inpatient Hospital Stay: Payer: Medicare Other

## 2017-04-22 DIAGNOSIS — M810 Age-related osteoporosis without current pathological fracture: Secondary | ICD-10-CM | POA: Diagnosis not present

## 2017-04-22 DIAGNOSIS — C7951 Secondary malignant neoplasm of bone: Secondary | ICD-10-CM | POA: Insufficient documentation

## 2017-04-22 DIAGNOSIS — C61 Malignant neoplasm of prostate: Secondary | ICD-10-CM

## 2017-04-22 DIAGNOSIS — Z95828 Presence of other vascular implants and grafts: Secondary | ICD-10-CM

## 2017-04-22 DIAGNOSIS — I10 Essential (primary) hypertension: Secondary | ICD-10-CM | POA: Diagnosis not present

## 2017-04-22 LAB — CMP (CANCER CENTER ONLY)
ALK PHOS: 65 U/L (ref 40–150)
ALT: 14 U/L (ref 0–55)
AST: 18 U/L (ref 5–34)
Albumin: 3.6 g/dL (ref 3.5–5.0)
Anion gap: 11 (ref 3–11)
BUN: 16 mg/dL (ref 7–26)
CALCIUM: 9.5 mg/dL (ref 8.4–10.4)
CHLORIDE: 106 mmol/L (ref 98–109)
CO2: 27 mmol/L (ref 22–29)
CREATININE: 1.07 mg/dL (ref 0.70–1.30)
Glucose, Bld: 101 mg/dL (ref 70–140)
Potassium: 3.1 mmol/L — ABNORMAL LOW (ref 3.5–5.1)
Sodium: 144 mmol/L (ref 136–145)
Total Bilirubin: 0.4 mg/dL (ref 0.2–1.2)
Total Protein: 6.5 g/dL (ref 6.4–8.3)

## 2017-04-22 LAB — CBC WITH DIFFERENTIAL (CANCER CENTER ONLY)
BASOS ABS: 0 10*3/uL (ref 0.0–0.1)
Basophils Relative: 0 %
Eosinophils Absolute: 0.1 10*3/uL (ref 0.0–0.5)
Eosinophils Relative: 2 %
HCT: 36.1 % — ABNORMAL LOW (ref 38.4–49.9)
HEMOGLOBIN: 11.8 g/dL — AB (ref 13.0–17.1)
LYMPHS ABS: 2.2 10*3/uL (ref 0.9–3.3)
Lymphocytes Relative: 35 %
MCH: 30.3 pg (ref 27.2–33.4)
MCHC: 32.7 g/dL (ref 32.0–36.0)
MCV: 92.6 fL (ref 79.3–98.0)
Monocytes Absolute: 0.4 10*3/uL (ref 0.1–0.9)
Monocytes Relative: 7 %
NEUTROS ABS: 3.5 10*3/uL (ref 1.5–6.5)
NEUTROS PCT: 56 %
Platelet Count: 224 10*3/uL (ref 140–400)
RBC: 3.9 MIL/uL — AB (ref 4.20–5.82)
RDW: 13.9 % (ref 11.0–14.6)
WBC: 6.2 10*3/uL (ref 4.0–10.3)

## 2017-04-22 MED ORDER — SODIUM CHLORIDE 0.9 % IJ SOLN
10.0000 mL | INTRAMUSCULAR | Status: DC | PRN
  Administered 2017-04-22: 10 mL via INTRAVENOUS
  Filled 2017-04-22: qty 10

## 2017-04-22 MED ORDER — HEPARIN SOD (PORK) LOCK FLUSH 100 UNIT/ML IV SOLN
500.0000 [IU] | Freq: Once | INTRAVENOUS | Status: AC | PRN
  Administered 2017-04-22: 500 [IU] via INTRAVENOUS
  Filled 2017-04-22: qty 5

## 2017-04-23 LAB — PROSTATE-SPECIFIC AG, SERUM (LABCORP): Prostate Specific Ag, Serum: 0.5 ng/mL (ref 0.0–4.0)

## 2017-04-24 ENCOUNTER — Inpatient Hospital Stay: Payer: Medicare Other

## 2017-04-24 ENCOUNTER — Inpatient Hospital Stay (HOSPITAL_BASED_OUTPATIENT_CLINIC_OR_DEPARTMENT_OTHER): Payer: Medicare Other | Admitting: Oncology

## 2017-04-24 ENCOUNTER — Telehealth: Payer: Self-pay | Admitting: Oncology

## 2017-04-24 VITALS — BP 158/84 | HR 65 | Temp 97.6°F | Resp 18 | Ht 76.0 in | Wt 185.3 lb

## 2017-04-24 DIAGNOSIS — C61 Malignant neoplasm of prostate: Secondary | ICD-10-CM

## 2017-04-24 DIAGNOSIS — I10 Essential (primary) hypertension: Secondary | ICD-10-CM | POA: Diagnosis not present

## 2017-04-24 DIAGNOSIS — C7951 Secondary malignant neoplasm of bone: Secondary | ICD-10-CM | POA: Diagnosis not present

## 2017-04-24 DIAGNOSIS — M810 Age-related osteoporosis without current pathological fracture: Secondary | ICD-10-CM

## 2017-04-24 MED ORDER — DENOSUMAB 120 MG/1.7ML ~~LOC~~ SOLN
120.0000 mg | Freq: Once | SUBCUTANEOUS | Status: AC
  Administered 2017-04-24: 120 mg via SUBCUTANEOUS

## 2017-04-24 NOTE — Patient Instructions (Signed)

## 2017-04-24 NOTE — Progress Notes (Signed)
Hematology and Oncology Follow Up Visit  Montravious Weigelt [id number] 04-21-1945 72 y.o. 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  He presented with advanced disease at the time of diagnosis.  Prior Therapy: Status post orchiectomy done on November 2016.  Taxotere chemotherapy at 75 mg/m to start on 02/16/2015. He is status post 6 cycles of therapy last cycle received in March 2017.  Current therapy: Zytiga 1000 mg daily with prednisone at 5 mg daily started on 09/13/2015.  The sound was discontinued because of side effects.  Interim History:  Mr. Rawdon presents today for a follow-up visit.  Since the last visit, he has discontinued prednisone and has been doing reasonably well since this medication has been stopped.  He is no longer reporting any anxiety or concentration issues.  He is no longer experiencing issues with depression and his mentation and concentration has been improved.  He is no longer taking lorazepam at this time.  He denies any complications related to Zytiga.  His lower extremity edema has resolved without any issues related to nausea, vomiting or appetite changes.  He did lose some weight but but his appetite is not affected.   he does not report any headaches, blurry vision, syncope or seizures. He does not report any fevers, chills, sweats or weight loss. He does not report any chest pain, palpitation, orthopnea. Does not report any cough, wheezing, hemoptysis or dyspnea on exertion. He does not report any nausea, vomiting, abdominal pain, hematochezia. He does not report any frequency, urgency or hesitancy. Does not report any hematuria or dysuria. Does not report any lymphadenopathy or petechiae.  He does not report any heat or cold intolerance.  He does not report any skin rashes or lesions.  He denies any  anxiety or depression.  Remaining review of systems unremarkable  Medications: I have reviewed the patient's current medications.   Current Outpatient Medications  Medication Sig Dispense Refill  . abiraterone acetate (ZYTIGA) 250 MG tablet Take 1,000 mg by mouth daily. Take on an empty stomach 1 hour before or 2 hours after a meal    . aspirin EC 81 MG tablet Take 81 mg by mouth daily.    Marland Kitchen atorvastatin (LIPITOR) 10 MG tablet Take 1 tablet (10 mg total) by mouth daily. 90 tablet 1  . BIOTIN 5000 PO Take 1 tablet by mouth daily.    . Calcium Carbonate-Vitamin D3 (CALCIUM 600+D3) 600-400 MG-UNIT TABS Take 1 tablet by mouth daily.    . Denosumab (XGEVA [place]) Inject into the skin.    . fexofenadine (ALLEGRA) 180 MG tablet Take 180 mg by mouth daily.    . fluticasone (FLONASE) 50 MCG/ACT nasal spray Place 2 sprays into both nostrils daily. 16 g 5  . lidocaine-prilocaine (EMLA) cream Apply 1 application topically as needed. Apply one teaspoon topically to port 1-2 hours prior to access. 30 g 2  . tretinoin (RETIN-A) 0.1 % cream Apply 1 application topically at bedtime.     No current facility-administered medications for this visit.    Facility-Administered Medications Ordered in Other Visits  Medication Dose Route Frequency Provider Last Rate Last Dose  . alteplase (CATHFLO ACTIVASE) injection 2 mg  2 mg Intracatheter Once PRN [name], MD      . alteplase (CATHFLO ACTIVASE) injection 2 mg  2 mg Intracatheter Once PRN  [name], MD         Allergies: No Known Allergies  Past Medical History, Surgical history, Social history, and Family History remain unchanged.   Physical Exam: Blood pressure (!) 158/84, pulse 65, temperature 97.6 F (36.4 C), temperature source Oral, resp. rate 18, height 6\' 4"  (1.93 m), weight 185 lb 4.8 oz (84.1 kg), SpO2 100 %.  ECOG: 0 General appearance: Well-appearing gentleman appeared without any major distress. Head: Normocephalic, without obvious  abnormality.  Atraumatic. Oropharynx: Mucous membranes are moist and pink. Eyes: Sclera anicteric.  Pupils are equal and round and reactive. Lymph nodes: Cervical, supraclavicular, and axillary nodes normal. Heart:regular rate and rhythm, S1, S2 normal, no murmur, click, rub or gallop.  No leg edema. Lung: Clear in all lung fields without wheezes or dullness to percussion. Abdomin: Soft, nontender without any rebound or guarding.  Good bowel sounds auscultated. Musculoskeletal: Range of motion noted in all joints. Neurological: No motor, sensory deficits. Skin: No petechia or rash.  No ecchymosis. Psychiatric: Mood and affect are appropriate.     Lab Results: Lab Results  Component Value Date   WBC 6.2 04/22/2017   HGB 13.3 03/11/2017   HCT 36.1 (L) 04/22/2017   MCV 92.6 04/22/2017   PLT 224 04/22/2017     Chemistry      Component Value Date/Time   NA 144 04/22/2017 1307   NA 139 02/10/2017 1323   K 3.1 (L) 04/22/2017 1307   K 3.4 (L) 02/10/2017 1323   CL 106 04/22/2017 1307   CO2 27 04/22/2017 1307   CO2 23 02/10/2017 1323   BUN 16 04/22/2017 1307   BUN 13.6 02/10/2017 1323   CREATININE 1.07 04/22/2017 1307   CREATININE 0.9 02/10/2017 1323      Component Value Date/Time   CALCIUM 9.5 04/22/2017 1307   CALCIUM 9.2 02/10/2017 1323   ALKPHOS 65 04/22/2017 1307   ALKPHOS 50 02/10/2017 1323   AST 18 04/22/2017 1307   AST 20 02/10/2017 1323   ALT 14 04/22/2017 1307   ALT 15 02/10/2017 1323   BILITOT 0.4 04/22/2017 1307   BILITOT 0.52 02/10/2017 1323     Results for FRANCISCO, OSTROVSKY (MRN [id number]) as of 04/24/2017 15:05  Ref. Range 02/10/2017 13:23 03/18/2017 14:07 04/22/2017 13:07  Prostate Specific Ag, Serum Latest Ref Range: 0.0 - 4.0 ng/mL 0.8 0.8 0.5      Impression and Plan:  72 year old gentleman with the following issues:  1.  Castration resistant metastatic prostate cancer with disease diagnosed in October 2016. He presented with PSA of 2900 with the  lymphadenopathy and bone disease.  He is status post the outlined therapy above developed castration resistant disease and currently on Zytiga.  He has tolerated this medication very well after tapering prednisone doses.  His disease process was discussed today in detail as well as the natural course of this illness.  Future treatment options were also outlined.  He has benefited greatly from [facility] at this time with a PSA continues to be under excellent control.  Risks and benefits of continuing this medication was discussed today and he agreed to continue.   2. IV access: Port-A-Cath remain in place without complications.  3. Hypertension: No issues with hypertension at this time with blood pressure being normal on Zytiga.  4.  Androgen depravation: He is status post orchiectomy with testosterone level being castrate.  5.  Osteoporosis and risk of pathological fractures: Risks and benefits of continuing Xgeva was discussed today.  Complications such  as osteonecrosis of the jaw as well as hypocalcemia were reiterated.  That is agreeable to continue.  6. Left lower extremity edema: Resolved after discontinuation of prednisone.  7. Anxiety: Resolved after discontinuation of prednisone.  8. Follow-up: He will also be in 4 weeks.   25 minutes was spent with the patient face-to-face today.  More than 50% of time was dedicated to patient counseling, education and counseling regarding his condition and multifaceted care.     Zola Button, MD 2/22/20193:40 PM

## 2017-04-24 NOTE — Telephone Encounter (Signed)
Scheduled appt per 2/22 los - Gave patient AVS and calender per los.  

## 2017-04-28 ENCOUNTER — Other Ambulatory Visit: Payer: Self-pay | Admitting: *Deleted

## 2017-04-28 MED ORDER — ABIRATERONE ACETATE 250 MG PO TABS: 1000.0000 mg | ORAL_TABLET | Freq: Every day | ORAL | 0 refills | Status: DC

## 2017-04-28 NOTE — Telephone Encounter (Signed)
patient states [facility] is waiting for [place] to refill his zytiga. E-scribed zytiga to [facility].

## 2017-05-13 ENCOUNTER — Other Ambulatory Visit: Payer: Self-pay | Admitting: Internal Medicine

## 2017-05-13 DIAGNOSIS — J301 Allergic rhinitis due to pollen: Secondary | ICD-10-CM

## 2017-05-13 NOTE — Telephone Encounter (Signed)
Flonase refill request  LOV with Dr. Ronnald Ramp 03/11/17. This medication was discontinued on 03/11/17 but for PRN use per the notes.  [facility] [phone]  pharmacy in [place], [place].

## 2017-05-13 NOTE — Telephone Encounter (Signed)
Copied from [place]. Topic: Quick Communication - Rx Refill/Question >> May 13, 2017 10:44 AM Oliver Pila B wrote: Medication:  fluticasone (FLONASE) 50 MCG/ACT nasal spray [[id number]]   Has the patient contacted their pharmacy? No.   (Agent: If no, request that the patient contact the pharmacy for the refill.)   Preferred Pharmacy (with phone number or street name): [facility]   Agent: Please be advised that RX refills may take up to 3 business days. We ask that you follow-up with your pharmacy.

## 2017-05-14 MED ORDER — FLUTICASONE PROPIONATE 50 MCG/ACT NA SUSP: 2.0000 | Freq: Every day | NASAL | 2 refills | Status: DC

## 2017-05-20 ENCOUNTER — Inpatient Hospital Stay: Payer: Medicare Other | Attending: Oncology

## 2017-05-20 ENCOUNTER — Inpatient Hospital Stay: Payer: Medicare Other

## 2017-05-20 DIAGNOSIS — C61 Malignant neoplasm of prostate: Secondary | ICD-10-CM

## 2017-05-20 DIAGNOSIS — Z79899 Other long term (current) drug therapy: Secondary | ICD-10-CM | POA: Insufficient documentation

## 2017-05-20 DIAGNOSIS — Z9221 Personal history of antineoplastic chemotherapy: Secondary | ICD-10-CM | POA: Diagnosis not present

## 2017-05-20 DIAGNOSIS — C7951 Secondary malignant neoplasm of bone: Secondary | ICD-10-CM | POA: Insufficient documentation

## 2017-05-20 DIAGNOSIS — M810 Age-related osteoporosis without current pathological fracture: Secondary | ICD-10-CM | POA: Insufficient documentation

## 2017-05-20 DIAGNOSIS — R600 Localized edema: Secondary | ICD-10-CM | POA: Diagnosis not present

## 2017-05-20 DIAGNOSIS — F419 Anxiety disorder, unspecified: Secondary | ICD-10-CM | POA: Diagnosis not present

## 2017-05-20 DIAGNOSIS — Z7982 Long term (current) use of aspirin: Secondary | ICD-10-CM | POA: Diagnosis not present

## 2017-05-20 DIAGNOSIS — I10 Essential (primary) hypertension: Secondary | ICD-10-CM | POA: Diagnosis not present

## 2017-05-20 LAB — CBC WITH DIFFERENTIAL (CANCER CENTER ONLY)
BASOS ABS: 0 10*3/uL (ref 0.0–0.1)
BASOS PCT: 1 %
Eosinophils Absolute: 0.1 10*3/uL (ref 0.0–0.5)
Eosinophils Relative: 2 %
HCT: 36 % — ABNORMAL LOW (ref 38.4–49.9)
Hemoglobin: 11.9 g/dL — ABNORMAL LOW (ref 13.0–17.1)
LYMPHS PCT: 31 %
Lymphs Abs: 1.7 10*3/uL (ref 0.9–3.3)
MCH: 30 pg (ref 27.2–33.4)
MCHC: 33.2 g/dL (ref 32.0–36.0)
MCV: 90.6 fL (ref 79.3–98.0)
MONO ABS: 0.6 10*3/uL (ref 0.1–0.9)
Monocytes Relative: 11 %
Neutro Abs: 3.1 10*3/uL (ref 1.5–6.5)
Neutrophils Relative %: 55 %
PLATELETS: 215 10*3/uL (ref 140–400)
RBC: 3.97 MIL/uL — ABNORMAL LOW (ref 4.20–5.82)
RDW: 13.9 % (ref 11.0–14.6)
WBC Count: 5.5 10*3/uL (ref 4.0–10.3)

## 2017-05-20 LAB — CMP (CANCER CENTER ONLY)
ALBUMIN: 3.6 g/dL (ref 3.5–5.0)
ALK PHOS: 64 U/L (ref 40–150)
ALT: 13 U/L (ref 0–55)
AST: 17 U/L (ref 5–34)
Anion gap: 9 (ref 3–11)
BILIRUBIN TOTAL: 0.4 mg/dL (ref 0.2–1.2)
BUN: 17 mg/dL (ref 7–26)
CALCIUM: 9.5 mg/dL (ref 8.4–10.4)
CO2: 25 mmol/L (ref 22–29)
Chloride: 106 mmol/L (ref 98–109)
Creatinine: 0.86 mg/dL (ref 0.70–1.30)
GFR, Est AFR Am: >60 mL/min (ref >60)
GFR, Estimated: >60 mL/min (ref >60)
GLUCOSE: 105 mg/dL (ref 70–140)
Potassium: 3.6 mmol/L (ref 3.5–5.1)
Sodium: 140 mmol/L (ref 136–145)
TOTAL PROTEIN: 6.4 g/dL (ref 6.4–8.3)

## 2017-05-20 NOTE — Patient Instructions (Signed)
Implanted Port Home Guide An implanted port is a type of central line that is placed under the skin. Central lines are used to provide IV access when treatment or nutrition needs to be given through a person's veins. Implanted ports are used for long-term IV access. An implanted port may be placed because:  You need IV medicine that would be irritating to the small veins in your hands or arms.  You need long-term IV medicines, such as antibiotics.  You need IV nutrition for a long period.  You need frequent blood draws for lab tests.  You need dialysis.  Implanted ports are usually placed in the chest area, but they can also be placed in the upper arm, the abdomen, or the leg. An implanted port has two main parts:  Reservoir. The reservoir is round and will appear as a small, raised area under your skin. The reservoir is the part where a needle is inserted to give medicines or draw blood.  Catheter. The catheter is a thin, flexible tube that extends from the reservoir. The catheter is placed into a large vein. Medicine that is inserted into the reservoir goes into the catheter and then into the vein.  How will I care for my incision site? Do not get the incision site wet. Bathe or shower as directed by your health care provider. How is my port accessed? Special steps must be taken to access the port:  Before the port is accessed, a numbing cream can be placed on the skin. This helps numb the skin over the port site.  Your health care provider uses a sterile technique to access the port. ? Your health care provider must put on a mask and sterile gloves. ? The skin over your port is cleaned carefully with an antiseptic and allowed to dry. ? The port is gently pinched between sterile gloves, and a needle is inserted into the port.  Only "non-coring" port needles should be used to access the port. Once the port is accessed, a blood return should be checked. This helps ensure that the port  is in the vein and is not clogged.  If your port needs to remain accessed for a constant infusion, a clear (transparent) bandage will be placed over the needle site. The bandage and needle will need to be changed every week, or as directed by your health care provider.  Keep the bandage covering the needle clean and dry. Do not get it wet. Follow your health care provider's instructions on how to take a shower or bath while the port is accessed.  If your port does not need to stay accessed, no bandage is needed over the port.  What is flushing? Flushing helps keep the port from getting clogged. Follow your health care provider's instructions on how and when to flush the port. Ports are usually flushed with saline solution or a medicine called heparin. The need for flushing will depend on how the port is used.  If the port is used for intermittent medicines or blood draws, the port will need to be flushed: ? After medicines have been given. ? After blood has been drawn. ? As part of routine maintenance.  If a constant infusion is running, the port may not need to be flushed.  How long will my port stay implanted? The port can stay in for as long as your health care provider thinks it is needed. When it is time for the port to come out, surgery will be   done to remove it. The procedure is similar to the one performed when the port was put in. When should I seek immediate medical care? When you have an implanted port, you should seek immediate medical care if:  You notice a bad smell coming from the incision site.  You have swelling, redness, or drainage at the incision site.  You have more swelling or pain at the port site or the surrounding area.  You have a fever that is not controlled with medicine.  This information is not intended to replace advice given to you by your health care provider. Make sure you discuss any questions you have with your health care provider. Document  Released: 02/17/2005 Document Revised: 07/26/2015 Document Reviewed: 10/25/2012 Elsevier Interactive Patient Education  2017 [employer]  

## 2017-05-21 ENCOUNTER — Telehealth: Payer: Self-pay | Admitting: Oncology

## 2017-05-21 ENCOUNTER — Inpatient Hospital Stay: Payer: Medicare Other

## 2017-05-21 ENCOUNTER — Inpatient Hospital Stay (HOSPITAL_BASED_OUTPATIENT_CLINIC_OR_DEPARTMENT_OTHER): Payer: Medicare Other | Admitting: Oncology

## 2017-05-21 ENCOUNTER — Other Ambulatory Visit: Payer: Self-pay | Admitting: *Deleted

## 2017-05-21 VITALS — BP 161/97 | HR 74 | Temp 98.6°F | Resp 18 | Ht 76.0 in | Wt 186.1 lb

## 2017-05-21 DIAGNOSIS — R600 Localized edema: Secondary | ICD-10-CM | POA: Diagnosis not present

## 2017-05-21 DIAGNOSIS — F419 Anxiety disorder, unspecified: Secondary | ICD-10-CM | POA: Diagnosis not present

## 2017-05-21 DIAGNOSIS — C7951 Secondary malignant neoplasm of bone: Secondary | ICD-10-CM | POA: Diagnosis not present

## 2017-05-21 DIAGNOSIS — I10 Essential (primary) hypertension: Secondary | ICD-10-CM

## 2017-05-21 DIAGNOSIS — M810 Age-related osteoporosis without current pathological fracture: Secondary | ICD-10-CM | POA: Diagnosis not present

## 2017-05-21 DIAGNOSIS — C61 Malignant neoplasm of prostate: Secondary | ICD-10-CM

## 2017-05-21 DIAGNOSIS — Z7982 Long term (current) use of aspirin: Secondary | ICD-10-CM | POA: Diagnosis not present

## 2017-05-21 DIAGNOSIS — Z95828 Presence of other vascular implants and grafts: Secondary | ICD-10-CM

## 2017-05-21 DIAGNOSIS — Z79899 Other long term (current) drug therapy: Secondary | ICD-10-CM | POA: Diagnosis not present

## 2017-05-21 DIAGNOSIS — Z9221 Personal history of antineoplastic chemotherapy: Secondary | ICD-10-CM | POA: Diagnosis not present

## 2017-05-21 LAB — PROSTATE-SPECIFIC AG, SERUM (LABCORP): Prostate Specific Ag, Serum: 0.4 ng/mL (ref 0.0–4.0)

## 2017-05-21 MED ORDER — DENOSUMAB 120 MG/1.7ML ~~LOC~~ SOLN
120.0000 mg | Freq: Once | SUBCUTANEOUS | Status: AC
  Administered 2017-05-21: 120 mg via SUBCUTANEOUS

## 2017-05-21 MED ORDER — DENOSUMAB 120 MG/1.7ML ~~LOC~~ SOLN
SUBCUTANEOUS | Status: AC
  Filled 2017-05-21: qty 1.7

## 2017-05-21 MED ORDER — ALTEPLASE 2 MG IJ SOLR
2.0000 mg | Freq: Once | INTRAMUSCULAR | Status: DC | PRN
  Filled 2017-05-21: qty 2

## 2017-05-21 MED ORDER — HEPARIN SOD (PORK) LOCK FLUSH 100 UNIT/ML IV SOLN
500.0000 [IU] | Freq: Once | INTRAVENOUS | Status: DC | PRN
  Filled 2017-05-21: qty 5

## 2017-05-21 MED ORDER — ABIRATERONE ACETATE 250 MG PO TABS: 1000.0000 mg | ORAL_TABLET | Freq: Every day | ORAL | 0 refills | Status: DC

## 2017-05-21 NOTE — Progress Notes (Signed)
Hematology and Oncology Follow Up Visit  Philip [name] [id number] Sep 18, 1945 72 y.o. 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  He was found to have advanced disease at the time of diagnosis with a PSA of 2900 in addition to lymphadenopathy and bone involvement.  Prior Therapy: Status post orchiectomy done on November 2016.  Taxotere chemotherapy at 75 mg/m to start on 02/16/2015. He is status post 6 cycles of therapy last cycle received in March 2017.  Current therapy: Zytiga 1000 mg daily with prednisone at 5 mg daily started on 09/13/2015.  He is no longer taking prednisone which was tapered off because of toxicities.  Interim History:  Philip [name] is here for a follow-up.  He reports continuous improvement in his health since last visit.  He denies any recent bone pain or pathological fractures.  He denies any lower extremity swelling or worsening edema.  He has chronic edema on the left continues to improve as he stopped the prednisone.  His mood and overall affect has been appropriate.  He denies any anxiety and has been able to concentrate on tasks much better.  He continues to take Zytiga without any new complications.  He denies any excessive fatigue, tiredness or hematuria.  He denied any hospitalization or illnesses.   he does not report any headaches, blurry vision, syncope or seizures. He does not report any fevers, chills, sweats or weight loss. He does not report any chest pain, palpitation, orthopnea. Does not report any cough, wheezing, hemoptysis or dyspnea on exertion. He does not report any nausea, vomiting, abdominal pain, hematochezia. He does not report any frequency, urgency or hesitancy. Does not report any hematuria or dysuria. Does not report any lymphadenopathy or petechiae.  He does not report any heat or cold intolerance.  He does not report any skin  rashes or lesions.  He denies any anxiety or depression.  Remaining review of systems is negative.  Medications: I have reviewed the patient's current medications.   Current Outpatient Medications  Medication Sig Dispense Refill  . abiraterone acetate (ZYTIGA) 250 MG tablet Take 4 tablets (1,000 mg total) by mouth daily. Take on an empty stomach 1 hour before or 2 hours after a meal 120 tablet 0  . aspirin EC 81 MG tablet Take 81 mg by mouth daily.    Marland Kitchen atorvastatin (LIPITOR) 10 MG tablet Take 1 tablet (10 mg total) by mouth daily. 90 tablet 1  . BIOTIN 5000 PO Take 1 tablet by mouth daily.    . Calcium Carbonate-Vitamin D3 (CALCIUM 600+D3) 600-400 MG-UNIT TABS Take 1 tablet by mouth daily.    . Denosumab (XGEVA [place]) Inject into the skin.    . fexofenadine (ALLEGRA) 180 MG tablet Take 180 mg by mouth daily.    . fluticasone (FLONASE) 50 MCG/ACT nasal spray Place 2 sprays into both nostrils daily. 16 g 2  . lidocaine-prilocaine (EMLA) cream Apply 1 application topically as needed. Apply one teaspoon topically to port 1-2 hours prior to access. 30 g 2  . tretinoin (RETIN-A) 0.1 % cream Apply 1 application topically at bedtime.     No current facility-administered medications for this visit.    Facility-Administered Medications Ordered in Other Visits  Medication Dose Route Frequency Provider Last Rate Last Dose  . alteplase (CATHFLO ACTIVASE) injection 2 mg  2 mg Intracatheter Once PRN [name], MD      .  alteplase (CATHFLO ACTIVASE) injection 2 mg  2 mg Intracatheter Once PRN [name], MD         Allergies: No Known Allergies  Past Medical History, Surgical history, Social history, and Family History reviewed and remain the same.   Physical Exam: Blood pressure (!) 161/97, pulse 74, temperature 98.6 F (37 C), temperature source Oral, resp. rate 18, height 6\' 4"  (1.93 m), weight 186 lb 1.6 oz (84.4 kg), SpO2 100 %.   ECOG: 0 General appearance: Alert, awake comfortable  gentleman without distress. Head: Without abnormalities. Oropharynx: No thrush or ulcers. Eyes: Pupils are equal and sclera not injected. Lymph nodes: No lymphadenopathy palpated in the cervical, axillary and supraclavicular areas. Heart: Regular rate and rhythm without any murmurs or gallops. Lung: Clear to auscultation without any wheezes or dullness to percussion. Abdomin: Soft, nondistended with good bowel sounds.  No shifting dullness or ascites. Musculoskeletal: No joint deformity or effusion. Neurological: No deficits noted motor, sensory or deep tendon reflexes.. Skin: Ecchymosis or petechiae. Psychiatric: Appropriate mood without any stress or anxiety.     Lab Results: Lab Results  Component Value Date   WBC 5.5 05/20/2017   HGB 13.3 03/11/2017   HCT 36.0 (L) 05/20/2017   MCV 90.6 05/20/2017   PLT 215 05/20/2017     Chemistry      Component Value Date/Time   NA 140 05/20/2017 1236   NA 139 02/10/2017 1323   K 3.6 05/20/2017 1236   K 3.4 (L) 02/10/2017 1323   CL 106 05/20/2017 1236   CO2 25 05/20/2017 1236   CO2 23 02/10/2017 1323   BUN 17 05/20/2017 1236   BUN 13.6 02/10/2017 1323   CREATININE 0.86 05/20/2017 1236   CREATININE 0.9 02/10/2017 1323      Component Value Date/Time   CALCIUM 9.5 05/20/2017 1236   CALCIUM 9.2 02/10/2017 1323   ALKPHOS 64 05/20/2017 1236   ALKPHOS 50 02/10/2017 1323   AST 17 05/20/2017 1236   AST 20 02/10/2017 1323   ALT 13 05/20/2017 1236   ALT 15 02/10/2017 1323   BILITOT 0.4 05/20/2017 1236   BILITOT 0.52 02/10/2017 1323      Results for Philip [name] (MRN [id number]) as of 05/21/2017 13:02  Ref. Range 04/22/2017 13:07 05/20/2017 12:36  Prostate Specific Ag, Serum Latest Ref Range: 0.0 - 4.0 ng/mL 0.5 0.4      Impression and Plan:  72 year old gentleman with the following issues:  1.  Advanced castration resistant prostate cancer with lymphadenopathy and bone disease.  He presented with PSA of 2900.  He is currently  on Zytiga alone without any major complications.  His PSA continues to be under excellent response currently at 0.4.  His disease has been reasonably controlled with the current treatment.  Risks and benefits of continuing this medication long-term was reviewed today he is agreeable to continue.  We will obtain a bone scan to update his staging.  2. IV access: Port-A-Cath used for chemotherapy in the past.  This will continue to be flushed periodically as he require chemotherapy in the future.  3. Hypertension: Blood pressure slightly elevated.  We will continue to monitor his blood pressure readings on Zytiga.  4.  Androgen depravation: We will continue to check testosterone level to ensure adequate castration.  5.  Osteoporosis and risk of pathological fractures: He is currently on Xgeva without complications.  Risks and benefits were outlined today long-term complications such as osteonecrosis of the jaw as well as hypocalcemia were  reviewed.  He is agreeable to continue.  6. Left lower extremity edema: Nearly resolved at this time with very residual left lower extremity swelling.  7. Anxiety: manageable at this time.  He is no longer taking lorazepam.  8. Follow-up: He will also be in 4 weeks.   25 minutes was spent with the patient face-to-face today.  More than 50% of time was dedicated to patient counseling, education and answering questions regarding diagnosis, prognosis and future plan of care.    Zola Button, MD 3/21/20191:02 PM

## 2017-05-21 NOTE — Patient Instructions (Signed)

## 2017-05-21 NOTE — Telephone Encounter (Signed)
Scheduled appt per 3/21 los - Gave patient AVS and calender per los. - Central radiology to contact patient with bone scan .

## 2017-06-12 ENCOUNTER — Encounter: Payer: Self-pay | Admitting: *Deleted

## 2017-06-19 ENCOUNTER — Encounter (HOSPITAL_COMMUNITY)
Admission: RE | Admit: 2017-06-19 | Discharge: 2017-06-19 | Disposition: A | Discharge status: Home / Self Care | Payer: Medicare Other | Source: Ambulatory Visit | Attending: Oncology | Admitting: Oncology

## 2017-06-19 DIAGNOSIS — C61 Malignant neoplasm of prostate: Secondary | ICD-10-CM | POA: Diagnosis not present

## 2017-06-19 DIAGNOSIS — Z8546 Personal history of malignant neoplasm of prostate: Secondary | ICD-10-CM | POA: Diagnosis not present

## 2017-06-19 MED ORDER — TECHNETIUM TC 99M MEDRONATE IV KIT
18.3000 | PACK | Freq: Once | INTRAVENOUS | Status: AC
  Administered 2017-06-19: 18.3 via INTRAVENOUS

## 2017-06-22 ENCOUNTER — Inpatient Hospital Stay: Payer: Medicare Other

## 2017-06-22 ENCOUNTER — Inpatient Hospital Stay: Payer: Medicare Other | Attending: Oncology

## 2017-06-22 DIAGNOSIS — Z9079 Acquired absence of other genital organ(s): Secondary | ICD-10-CM | POA: Diagnosis not present

## 2017-06-22 DIAGNOSIS — Z7982 Long term (current) use of aspirin: Secondary | ICD-10-CM | POA: Insufficient documentation

## 2017-06-22 DIAGNOSIS — C7951 Secondary malignant neoplasm of bone: Secondary | ICD-10-CM | POA: Insufficient documentation

## 2017-06-22 DIAGNOSIS — R600 Localized edema: Secondary | ICD-10-CM | POA: Diagnosis not present

## 2017-06-22 DIAGNOSIS — Z9221 Personal history of antineoplastic chemotherapy: Secondary | ICD-10-CM | POA: Diagnosis not present

## 2017-06-22 DIAGNOSIS — Z95828 Presence of other vascular implants and grafts: Secondary | ICD-10-CM

## 2017-06-22 DIAGNOSIS — C61 Malignant neoplasm of prostate: Secondary | ICD-10-CM | POA: Insufficient documentation

## 2017-06-22 DIAGNOSIS — Z79899 Other long term (current) drug therapy: Secondary | ICD-10-CM | POA: Diagnosis not present

## 2017-06-22 DIAGNOSIS — I10 Essential (primary) hypertension: Secondary | ICD-10-CM | POA: Diagnosis not present

## 2017-06-22 DIAGNOSIS — Z7952 Long term (current) use of systemic steroids: Secondary | ICD-10-CM | POA: Insufficient documentation

## 2017-06-22 DIAGNOSIS — R590 Localized enlarged lymph nodes: Secondary | ICD-10-CM | POA: Diagnosis not present

## 2017-06-22 LAB — CBC WITH DIFFERENTIAL (CANCER CENTER ONLY)
BASOS ABS: 0 10*3/uL (ref 0.0–0.1)
Basophils Relative: 0 %
Eosinophils Absolute: 0.1 10*3/uL (ref 0.0–0.5)
Eosinophils Relative: 1 %
HEMATOCRIT: 36.4 % — AB (ref 38.4–49.9)
Hemoglobin: 12 g/dL — ABNORMAL LOW (ref 13.0–17.1)
LYMPHS ABS: 1.9 10*3/uL (ref 0.9–3.3)
LYMPHS PCT: 32 %
MCH: 30.4 pg (ref 27.2–33.4)
MCHC: 33.1 g/dL (ref 32.0–36.0)
MCV: 91.9 fL (ref 79.3–98.0)
MONO ABS: 0.5 10*3/uL (ref 0.1–0.9)
Monocytes Relative: 9 %
NEUTROS ABS: 3.5 10*3/uL (ref 1.5–6.5)
Neutrophils Relative %: 58 %
Platelet Count: 222 10*3/uL (ref 140–400)
RBC: 3.96 MIL/uL — AB (ref 4.20–5.82)
RDW: 14.3 % (ref 11.0–14.6)
WBC Count: 6.1 10*3/uL (ref 4.0–10.3)

## 2017-06-22 LAB — CMP (CANCER CENTER ONLY)
ALT: 14 U/L (ref 0–55)
ANION GAP: 9 (ref 3–11)
AST: 17 U/L (ref 5–34)
Albumin: 3.7 g/dL (ref 3.5–5.0)
Alkaline Phosphatase: 61 U/L (ref 40–150)
BILIRUBIN TOTAL: 0.3 mg/dL (ref 0.2–1.2)
BUN: 16 mg/dL (ref 7–26)
CHLORIDE: 107 mmol/L (ref 98–109)
CO2: 22 mmol/L (ref 22–29)
Calcium: 9.7 mg/dL (ref 8.4–10.4)
Creatinine: 0.84 mg/dL (ref 0.70–1.30)
GFR, Est AFR Am: >60 mL/min (ref >60)
GFR, Estimated: >60 mL/min (ref >60)
Glucose, Bld: 108 mg/dL (ref 70–140)
POTASSIUM: 3.6 mmol/L (ref 3.5–5.1)
SODIUM: 138 mmol/L (ref 136–145)
TOTAL PROTEIN: 6.5 g/dL (ref 6.4–8.3)

## 2017-06-22 MED ORDER — SODIUM CHLORIDE 0.9 % IJ SOLN
10.0000 mL | INTRAMUSCULAR | Status: DC | PRN
  Administered 2017-06-22: 10 mL via INTRAVENOUS
  Filled 2017-06-22: qty 10

## 2017-06-22 MED ORDER — HEPARIN SOD (PORK) LOCK FLUSH 100 UNIT/ML IV SOLN
500.0000 [IU] | Freq: Once | INTRAVENOUS | Status: DC | PRN
  Filled 2017-06-22: qty 5

## 2017-06-23 LAB — TESTOSTERONE: Testosterone: <3 ng/dL — ABNORMAL LOW (ref 264–916)

## 2017-06-23 LAB — PROSTATE-SPECIFIC AG, SERUM (LABCORP): PROSTATE SPECIFIC AG, SERUM: 0.4 ng/mL (ref 0.0–4.0)

## 2017-06-24 ENCOUNTER — Telehealth: Payer: Self-pay | Admitting: Oncology

## 2017-06-24 ENCOUNTER — Inpatient Hospital Stay (HOSPITAL_BASED_OUTPATIENT_CLINIC_OR_DEPARTMENT_OTHER): Payer: Medicare Other | Admitting: Oncology

## 2017-06-24 ENCOUNTER — Inpatient Hospital Stay: Payer: Medicare Other

## 2017-06-24 VITALS — BP 170/81 | HR 71 | Temp 97.7°F | Resp 17 | Ht 76.0 in | Wt 190.6 lb

## 2017-06-24 DIAGNOSIS — C61 Malignant neoplasm of prostate: Secondary | ICD-10-CM | POA: Diagnosis not present

## 2017-06-24 DIAGNOSIS — Z7982 Long term (current) use of aspirin: Secondary | ICD-10-CM | POA: Diagnosis not present

## 2017-06-24 DIAGNOSIS — I10 Essential (primary) hypertension: Secondary | ICD-10-CM

## 2017-06-24 DIAGNOSIS — R600 Localized edema: Secondary | ICD-10-CM | POA: Diagnosis not present

## 2017-06-24 DIAGNOSIS — Z79899 Other long term (current) drug therapy: Secondary | ICD-10-CM

## 2017-06-24 DIAGNOSIS — Z9221 Personal history of antineoplastic chemotherapy: Secondary | ICD-10-CM

## 2017-06-24 DIAGNOSIS — Z7952 Long term (current) use of systemic steroids: Secondary | ICD-10-CM | POA: Diagnosis not present

## 2017-06-24 DIAGNOSIS — C7951 Secondary malignant neoplasm of bone: Secondary | ICD-10-CM | POA: Diagnosis not present

## 2017-06-24 DIAGNOSIS — R590 Localized enlarged lymph nodes: Secondary | ICD-10-CM | POA: Diagnosis not present

## 2017-06-24 DIAGNOSIS — Z9079 Acquired absence of other genital organ(s): Secondary | ICD-10-CM | POA: Diagnosis not present

## 2017-06-24 MED ORDER — DENOSUMAB 120 MG/1.7ML ~~LOC~~ SOLN
120.0000 mg | Freq: Once | SUBCUTANEOUS | Status: AC
  Administered 2017-06-24: 120 mg via SUBCUTANEOUS

## 2017-06-24 MED ORDER — DENOSUMAB 120 MG/1.7ML ~~LOC~~ SOLN
SUBCUTANEOUS | Status: AC
  Filled 2017-06-24: qty 1.7

## 2017-06-24 NOTE — Telephone Encounter (Signed)
Appointments scheduled AVS/Calendar printed per 4/24 los °

## 2017-06-24 NOTE — Progress Notes (Signed)
Hematology and Oncology Follow Up Visit  Rudie Sermons [id number] [birth year] 72 y.o. 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  He was found to have  PSA of 2900 at the time of diagnosis.  Prior Therapy: Status post orchiectomy done on November 2016.  Taxotere chemotherapy at 75 mg/m to start on 02/16/2015. He is status post 6 cycles of therapy last cycle received in March 2017.  Current therapy: Zytiga 1000 mg daily with prednisone at 5 mg daily started on 09/13/2015.    Interim History:  Mr. Yamashiro returns today for a follow-up.  He continues to report periodic left lower extremity edema especially if he is on his feet for an extended period of time.  The edema improves as soon as he elevates his leg.  He denies any pain in his calf or tenderness.  He remains very active and attends to activities of daily living.  He denies any recent bone pain or pathological fractures.  He has no complications related to Zytiga.  He denied any nausea or excessive fatigue.  He denies any GI complaints or changes in his appetite.  His quality of life remain excellent.  he does not report any headaches, blurry vision, syncope or seizures. He does not report any fevers, chills, sweats or weight loss. He does not report any chest pain, palpitation, orthopnea. Does not report any cough, wheezing, hemoptysis or dyspnea on exertion. He does not report any nausea, vomiting, abdominal pain, hematochezia. He does not report any frequency, urgency or hesitancy. Does not report any hematuria or dysuria. Does not report any lymphadenopathy or petechiae.   He does not report any skin rashes.  He denies any mood issues.  Remaining review of systems is negative.  Medications: I have reviewed the patient's current medications.   Current Outpatient Medications  Medication  Sig Dispense Refill  . abiraterone acetate (ZYTIGA) 250 MG tablet Take 4 tablets (1,000 mg total) by mouth daily. Take on an empty stomach 1 hour before or 2 hours after a meal 120 tablet 0  . aspirin EC 81 MG tablet Take 81 mg by mouth daily.    Marland Kitchen atorvastatin (LIPITOR) 10 MG tablet Take 1 tablet (10 mg total) by mouth daily. 90 tablet 1  . BIOTIN 5000 PO Take 1 tablet by mouth daily.    . Calcium Carbonate-Vitamin D3 (CALCIUM 600+D3) 600-400 MG-UNIT TABS Take 1 tablet by mouth daily.    . Denosumab (XGEVA [place]) Inject into the skin.    . fexofenadine (ALLEGRA) 180 MG tablet Take 180 mg by mouth daily.    . fluticasone (FLONASE) 50 MCG/ACT nasal spray Place 2 sprays into both nostrils daily. 16 g 2  . lidocaine-prilocaine (EMLA) cream Apply 1 application topically as needed. Apply one teaspoon topically to port 1-2 hours prior to access. 30 g 2  . tretinoin (RETIN-A) 0.1 % cream Apply 1 application topically at bedtime.     No current facility-administered medications for this visit.    Facility-Administered Medications Ordered in Other Visits  Medication Dose Route Frequency Provider Last Rate Last Dose  . alteplase (CATHFLO ACTIVASE) injection 2 mg  2 mg Intracatheter Once PRN [name], MD      . alteplase (CATHFLO ACTIVASE) injection 2 mg  2 mg Intracatheter Once PRN [name], MD         Allergies: No  Known Allergies  Past Medical History, Surgical history, Social history, and Family History reviewed and remain the same.   Physical Exam:  Blood pressure (!) 170/81, pulse 71, temperature 97.7 F (36.5 C), temperature source Oral, resp. rate 17, height 6\' 4"  (1.93 m), weight 190 lb 9.6 oz (86.5 kg), SpO2 100 %.   ECOG: 0 General appearance: Well-appearing gentleman without distress. Head: Atraumatic without abnormalities. Oropharynx: Mucous membranes are moist and pink. Eyes: Sclera anicteric. Lymph nodes: Cervical, axillary and supraclavicular lymph nodes are  normal. Heart: Regular rate without any murmurs or gallops. Lung: Clear in all lung fields without any wheezes or dullness to percussion. Abdomin: Soft nondistended with good bowel sounds.  No rebound or guarding. Musculoskeletal: No joint deformity or effusion.  No clubbing or cyanosis. Neurological: Intact motor and sensory exams. Skin: No skin rashes or lesions. Psychiatric: Mood remains stable and appropriate.     Lab Results: Lab Results  Component Value Date   WBC 6.1 06/22/2017   HGB 12.0 (L) 06/22/2017   HCT 36.4 (L) 06/22/2017   MCV 91.9 06/22/2017   PLT 222 06/22/2017     Chemistry      Component Value Date/Time   NA 138 06/22/2017 1324   NA 139 02/10/2017 1323   K 3.6 06/22/2017 1324   K 3.4 (L) 02/10/2017 1323   CL 107 06/22/2017 1324   CO2 22 06/22/2017 1324   CO2 23 02/10/2017 1323   BUN 16 06/22/2017 1324   BUN 13.6 02/10/2017 1323   CREATININE 0.84 06/22/2017 1324   CREATININE 0.9 02/10/2017 1323      Component Value Date/Time   CALCIUM 9.7 06/22/2017 1324   CALCIUM 9.2 02/10/2017 1323   ALKPHOS 61 06/22/2017 1324   ALKPHOS 50 02/10/2017 1323   AST 17 06/22/2017 1324   AST 20 02/10/2017 1323   ALT 14 06/22/2017 1324   ALT 15 02/10/2017 1323   BILITOT 0.3 06/22/2017 1324   BILITOT 0.52 02/10/2017 1323      Results for GERALDO, HARIS (MRN [id number]) as of 06/24/2017 12:39  Ref. Range 07/07/2016 09:35 08/13/2016 09:16 09/17/2016 11:02 10/22/2016 09:43 12/15/2016 10:18 01/13/2017 10:34 02/10/2017 13:23 03/18/2017 14:07 04/22/2017 13:07 05/20/2017 12:36 06/22/2017 13:24  Prostate Specific Ag, Serum Latest Ref Range: 0.0 - 4.0 ng/mL 1.4 1.3 1.2 0.9 0.8 1.1 0.8 0.8 0.5 0.4 0.4    EXAM: NUCLEAR MEDICINE WHOLE BODY BONE SCAN  TECHNIQUE: Whole body anterior and posterior images were obtained approximately 3 hours after intravenous injection of radiopharmaceutical.  RADIOPHARMACEUTICALS:  18.3 mCi Technetium-20m MDP IV  COMPARISON:  Bone scan of March 04, 2016.  FINDINGS: Abnormal focus of uptake is seen in the left hip consistent with metastatic disease. Several foci of abnormal uptake are noted in the thoracic and lumbar spine concerning for metastatic disease. No definite abnormal uptake is seen in the skull on the current exam. Focus of abnormal uptake is seen involving the right sternoclavicular joint which may represent metastatic disease or degenerative change. Focus of abnormal uptake seen in left femoral shaft on prior exam is not visualized currently.  IMPRESSION: Overall, there has significantly decreased amount of uptake involving the skeleton suggesting improving metastatic disease. Abnormal uptake remains within the proximal left femur, thoracic and lumbar spine concerning for metastatic disease. No definite abnormal uptake is noted within the skull or midshaft of left femur which was present on prior exam, suggesting improvement.    Impression and Plan:  72 year old man with:  1.  Castration-resistant prostate cancer  diagnosed in 2016 with PSA of 2900.  He has bone disease as well as lymphadenopathy.  He remains on Zytiga with excellent response to therapy and his PSA continues to be very low.  Bone scan obtained on 06/19/2017 was personally reviewed today and showed excellent response to therapy.  Risks and benefits of continuing Zytiga was discussed today.  He is agreeable to continue at this time.  Long-term complication associated with this medication were reviewed again which include edema, electrolyte imbalance and adrenal insufficiency.   2. IV access: Port-A-Cath will be flushed periodically.  No issues reported.  3. Hypertension: Blood pressure is elevated today.  I urged him to continue to measure his blood pressure periodically between visits and he might need antihypertensive medication if this continues.  4.  Androgen depravation: His testosterone level adequately castrate after orchiectomy.  Last  testosterone was less than 3 in April 2019.  5.  Osteoporosis and risk of pathological fractures: He reports no complications related to [facility] which will be continued monthly.  Long-term complications such as osteonecrosis of the jaw and hypokalemia were discussed again and is agreeable to continue.  6. Left lower extremity edema: Related to his tumor on the left side and overall edema is stable.  7. Anxiety: Resolved at this time after discontinuation of prednisone.  8.  Prognosis: He has an incurable malignancy but a disease that has been effectively palliated at this time.  Aggressive therapy is warranted given his excellent performance status.  9. Follow-up: He will also be in 4 weeks.   25 minutes was spent with the patient face-to-face today.  More than 50% of time was dedicated to patient counseling, education and coordinating his future plan of care.   Zola Button, MD 4/24/20191:06 PM

## 2017-06-24 NOTE — Patient Instructions (Signed)

## 2017-07-06 ENCOUNTER — Ambulatory Visit: Payer: Self-pay | Admitting: *Deleted

## 2017-07-06 DIAGNOSIS — R110 Nausea: Secondary | ICD-10-CM | POA: Diagnosis not present

## 2017-07-06 DIAGNOSIS — H8110 Benign paroxysmal vertigo, unspecified ear: Secondary | ICD-10-CM | POA: Diagnosis not present

## 2017-07-06 DIAGNOSIS — R112 Nausea with vomiting, unspecified: Secondary | ICD-10-CM | POA: Diagnosis not present

## 2017-07-06 DIAGNOSIS — C801 Malignant (primary) neoplasm, unspecified: Secondary | ICD-10-CM | POA: Diagnosis not present

## 2017-07-06 DIAGNOSIS — E876 Hypokalemia: Secondary | ICD-10-CM | POA: Diagnosis not present

## 2017-07-06 DIAGNOSIS — R918 Other nonspecific abnormal finding of lung field: Secondary | ICD-10-CM | POA: Diagnosis not present

## 2017-07-06 DIAGNOSIS — C7951 Secondary malignant neoplasm of bone: Secondary | ICD-10-CM | POA: Diagnosis not present

## 2017-07-06 DIAGNOSIS — R42 Dizziness and giddiness: Secondary | ICD-10-CM | POA: Diagnosis not present

## 2017-07-06 DIAGNOSIS — J984 Other disorders of lung: Secondary | ICD-10-CM | POA: Diagnosis not present

## 2017-07-06 NOTE — Telephone Encounter (Signed)
I  Returned a call to [place], wife regarding her husband, pt c/o of vertigo since waking up this morning.   He is laying here on the bed on his side because he is so dizzy he can't hardly walk.    I have to help him to the bathroom because he is so dizzy.   He is also nauseated with a mild headache.  I instructed them to get to the closest ED.   They are in [facility], [place].   Per Nevin Bloodgood they are in an upstairs room she doesn't think he can get down the stairs he is so dizzy.   I advised her to call 911 and let them transport him for the safety of both them.   I would not want both of you falling down the stairs, I told her.   She agreed.   She is going to have him taken to the closest hospital ED there.   Reason for Disposition . SEVERE dizziness (vertigo) (e.g., unable to walk without assistance)  Answer Assessment - Initial Assessment Questions 1. DESCRIPTION: "Describe your dizziness."     Feels like the room is spinning every time he gets up.   Is laying in bed now.    His wife, Nevin Bloodgood called in.   He was in the background laying in bed due to the vertigo. 2. VERTIGO: "Do you feel like either you or the room is spinning or tilting?"      Spinning with nausea. 3. LIGHTHEADED: "Do you feel lightheaded?" (e.g., somewhat faint, woozy, weak upon standing)     No 4. SEVERITY: "How bad is it?"  "Can you walk?"   - MILD - Feels unsteady but walking normally.   - MODERATE - Feels very unsteady when walking, but not falling; interferes with normal activities (e.g., school, work) .   - SEVERE - Unable to walk without falling (requires assistance).     Unable to ambulate without help.   Wife said she has to help him walk around because everything is spinning so bad. 5. ONSET:  "When did the dizziness begin?"     This morning when he woke up. 6. AGGRAVATING FACTORS: "Does anything make it worse?" (e.g., standing, change in head position)     Standing, walking, change in position other than laying down on  his side. 7. CAUSE: "What do you think is causing the dizziness?"     Per Nevin Bloodgood, maybe vertigo.    8. RECURRENT SYMPTOM: "Have you had dizziness before?" If so, ask: "When was the last time?" "What happened that time?"     No 9. OTHER SYMPTOMS: "Do you have any other symptoms?" (e.g., headache, weakness, numbness, vomiting, earache)     Nausea and a mild headache along with the spinning 10. PREGNANCY: "Is there any chance you are pregnant?" "When was your last menstrual period?"       N/A  Protocols used: DIZZINESS - VERTIGO-A-AH

## 2017-07-15 ENCOUNTER — Other Ambulatory Visit: Payer: Self-pay | Admitting: Oncology

## 2017-07-16 DIAGNOSIS — C7951 Secondary malignant neoplasm of bone: Secondary | ICD-10-CM | POA: Diagnosis not present

## 2017-07-16 DIAGNOSIS — C775 Secondary and unspecified malignant neoplasm of intrapelvic lymph nodes: Secondary | ICD-10-CM | POA: Diagnosis not present

## 2017-07-16 DIAGNOSIS — N1330 Unspecified hydronephrosis: Secondary | ICD-10-CM | POA: Diagnosis not present

## 2017-07-16 DIAGNOSIS — C61 Malignant neoplasm of prostate: Secondary | ICD-10-CM | POA: Diagnosis not present

## 2017-07-22 ENCOUNTER — Inpatient Hospital Stay: Payer: Medicare Other | Attending: Oncology

## 2017-07-22 ENCOUNTER — Inpatient Hospital Stay: Payer: Medicare Other

## 2017-07-22 DIAGNOSIS — C7951 Secondary malignant neoplasm of bone: Secondary | ICD-10-CM | POA: Diagnosis not present

## 2017-07-22 DIAGNOSIS — C61 Malignant neoplasm of prostate: Secondary | ICD-10-CM

## 2017-07-22 DIAGNOSIS — Z9079 Acquired absence of other genital organ(s): Secondary | ICD-10-CM | POA: Diagnosis not present

## 2017-07-22 DIAGNOSIS — Z923 Personal history of irradiation: Secondary | ICD-10-CM | POA: Insufficient documentation

## 2017-07-22 DIAGNOSIS — R600 Localized edema: Secondary | ICD-10-CM | POA: Diagnosis not present

## 2017-07-22 DIAGNOSIS — Z95828 Presence of other vascular implants and grafts: Secondary | ICD-10-CM

## 2017-07-22 DIAGNOSIS — Z7982 Long term (current) use of aspirin: Secondary | ICD-10-CM | POA: Diagnosis not present

## 2017-07-22 DIAGNOSIS — Z9221 Personal history of antineoplastic chemotherapy: Secondary | ICD-10-CM | POA: Diagnosis not present

## 2017-07-22 DIAGNOSIS — I10 Essential (primary) hypertension: Secondary | ICD-10-CM | POA: Insufficient documentation

## 2017-07-22 DIAGNOSIS — Z79899 Other long term (current) drug therapy: Secondary | ICD-10-CM | POA: Diagnosis not present

## 2017-07-22 LAB — CMP (CANCER CENTER ONLY)
ALK PHOS: 66 U/L (ref 40–150)
ALT: 14 U/L (ref 0–55)
ANION GAP: 8 (ref 3–11)
AST: 17 U/L (ref 5–34)
Albumin: 3.7 g/dL (ref 3.5–5.0)
BILIRUBIN TOTAL: 0.3 mg/dL (ref 0.2–1.2)
BUN: 15 mg/dL (ref 7–26)
CALCIUM: 9.2 mg/dL (ref 8.4–10.4)
CO2: 26 mmol/L (ref 22–29)
Chloride: 107 mmol/L (ref 98–109)
Creatinine: 0.84 mg/dL (ref 0.70–1.30)
GFR, Est AFR Am: >60 mL/min (ref >60)
GFR, Estimated: >60 mL/min (ref >60)
Glucose, Bld: 99 mg/dL (ref 70–140)
Potassium: 3.8 mmol/L (ref 3.5–5.1)
SODIUM: 141 mmol/L (ref 136–145)
TOTAL PROTEIN: 6.5 g/dL (ref 6.4–8.3)

## 2017-07-22 LAB — CBC WITH DIFFERENTIAL (CANCER CENTER ONLY)
Basophils Absolute: 0 10*3/uL (ref 0.0–0.1)
Basophils Relative: 1 %
EOS ABS: 0.2 10*3/uL (ref 0.0–0.5)
Eosinophils Relative: 3 %
HCT: 36.2 % — ABNORMAL LOW (ref 38.4–49.9)
Hemoglobin: 12 g/dL — ABNORMAL LOW (ref 13.0–17.1)
Lymphocytes Relative: 34 %
Lymphs Abs: 1.9 10*3/uL (ref 0.9–3.3)
MCH: 30.5 pg (ref 27.2–33.4)
MCHC: 33.2 g/dL (ref 32.0–36.0)
MCV: 91.7 fL (ref 79.3–98.0)
Monocytes Absolute: 0.5 10*3/uL (ref 0.1–0.9)
Monocytes Relative: 9 %
NEUTROS ABS: 3.1 10*3/uL (ref 1.5–6.5)
NEUTROS PCT: 53 %
PLATELETS: 226 10*3/uL (ref 140–400)
RBC: 3.94 MIL/uL — ABNORMAL LOW (ref 4.20–5.82)
RDW: 13.9 % (ref 11.0–14.6)
WBC Count: 5.7 10*3/uL (ref 4.0–10.3)

## 2017-07-22 MED ORDER — HEPARIN SOD (PORK) LOCK FLUSH 100 UNIT/ML IV SOLN
500.0000 [IU] | Freq: Once | INTRAVENOUS | Status: AC | PRN
  Administered 2017-07-22: 500 [IU] via INTRAVENOUS
  Filled 2017-07-22: qty 5

## 2017-07-22 MED ORDER — SODIUM CHLORIDE 0.9 % IJ SOLN
10.0000 mL | INTRAMUSCULAR | Status: DC | PRN
  Administered 2017-07-22: 10 mL via INTRAVENOUS
  Filled 2017-07-22: qty 10

## 2017-07-23 ENCOUNTER — Telehealth: Payer: Self-pay | Admitting: Oncology

## 2017-07-23 ENCOUNTER — Other Ambulatory Visit: Payer: Self-pay | Admitting: Oncology

## 2017-07-23 ENCOUNTER — Inpatient Hospital Stay (HOSPITAL_BASED_OUTPATIENT_CLINIC_OR_DEPARTMENT_OTHER): Payer: Medicare Other | Admitting: Oncology

## 2017-07-23 ENCOUNTER — Inpatient Hospital Stay: Payer: Medicare Other

## 2017-07-23 VITALS — BP 157/88 | HR 72 | Temp 97.7°F | Resp 18 | Ht 76.0 in | Wt 189.6 lb

## 2017-07-23 DIAGNOSIS — Z79899 Other long term (current) drug therapy: Secondary | ICD-10-CM

## 2017-07-23 DIAGNOSIS — Z7982 Long term (current) use of aspirin: Secondary | ICD-10-CM

## 2017-07-23 DIAGNOSIS — Z9079 Acquired absence of other genital organ(s): Secondary | ICD-10-CM | POA: Diagnosis not present

## 2017-07-23 DIAGNOSIS — R600 Localized edema: Secondary | ICD-10-CM

## 2017-07-23 DIAGNOSIS — I10 Essential (primary) hypertension: Secondary | ICD-10-CM | POA: Diagnosis not present

## 2017-07-23 DIAGNOSIS — Z923 Personal history of irradiation: Secondary | ICD-10-CM | POA: Diagnosis not present

## 2017-07-23 DIAGNOSIS — C61 Malignant neoplasm of prostate: Secondary | ICD-10-CM

## 2017-07-23 DIAGNOSIS — C7951 Secondary malignant neoplasm of bone: Secondary | ICD-10-CM

## 2017-07-23 DIAGNOSIS — Z9221 Personal history of antineoplastic chemotherapy: Secondary | ICD-10-CM

## 2017-07-23 LAB — PROSTATE-SPECIFIC AG, SERUM (LABCORP): Prostate Specific Ag, Serum: 0.4 ng/mL (ref 0.0–4.0)

## 2017-07-23 MED ORDER — DENOSUMAB 120 MG/1.7ML ~~LOC~~ SOLN
120.0000 mg | Freq: Once | SUBCUTANEOUS | Status: AC
  Administered 2017-07-23: 120 mg via SUBCUTANEOUS

## 2017-07-23 MED ORDER — DENOSUMAB 120 MG/1.7ML ~~LOC~~ SOLN
SUBCUTANEOUS | Status: AC
  Filled 2017-07-23: qty 1.7

## 2017-07-23 NOTE — Progress Notes (Signed)
Hematology and Oncology Follow Up Visit  Philip [name] [id number] 1945/11/24 72 y.o. 07/23/2017 3:54 PM Philip [name], MDJones, [name], MD   Principle Diagnosis: 72 year old man with castration-resistant prostate cancer diagnosed in October 2016 after presenting with a PSA of 2900 and lymphadenopathy.  He developed castration resistant disease in 2017.  Prior Therapy: Status post orchiectomy done on November 2016.  Taxotere chemotherapy at 75 mg/m to start on 02/16/2015. He is status post 6 cycles of therapy last cycle received in March 2017.  Current therapy: Zytiga 1000 mg daily with prednisone at 5 mg daily started on 09/13/2015.    Interim History:  [name] returns today for a follow-up visit.  Since last visit, he had an episode of vertigo and dehydration that required an emergency department visit while he was in [place].  His work-up revealed hypokalemia but imaging studies did not show any abnormalities.  He had a CT scan of the brain as well as the chest and did not show any malignancy.  The symptoms lasted 24 hours which included lightheadedness and dizziness and resolved spontaneously.  Since that episode, he feels reasonably well and regained all activities of daily living.  He continues to take Zytiga without any complications.  He denies any nausea, excessive fatigue or bone pain.  He denies any worsening edema at this time.  he does not report any headaches, blurry vision, syncope or seizures. He does not report any fevers, chills, sweats.  His appetite and weight remains intact.. He does not report any chest pain, palpitation, orthopnea. Does not report any cough, wheezing, hemoptysis or dyspnea on exertion. He does not report any nausea, vomiting, abdominal pain, hematochezia.  He denies any constipation or diarrhea.  He does not report any frequency, urgency or hesitancy. Does not report any hematuria or dysuria. Does not report any lymphadenopathy or petechiae.   He  does not report any skin rashes.  Denies any anxiety or depression.  He denies any cold or heat intolerance.  Remaining review of systems is negative.  Medications: I have reviewed the patient's current medications.   Current Outpatient Medications  Medication Sig Dispense Refill  . aspirin EC 81 MG tablet Take 81 mg by mouth daily.    Marland Kitchen atorvastatin (LIPITOR) 10 MG tablet Take 1 tablet (10 mg total) by mouth daily. 90 tablet 1  . BIOTIN 5000 PO Take 1 tablet by mouth daily.    . Calcium Carbonate-Vitamin D3 (CALCIUM 600+D3) 600-400 MG-UNIT TABS Take 1 tablet by mouth daily.    . Denosumab (XGEVA [place]) Inject into the skin.    . fexofenadine (ALLEGRA) 180 MG tablet Take 180 mg by mouth daily.    . fluticasone (FLONASE) 50 MCG/ACT nasal spray Place 2 sprays into both nostrils daily. 16 g 2  . hydrOXYzine (ATARAX/VISTARIL) 10 MG tablet Take 10 mg by mouth every 8 (eight) hours as needed.  10  . lidocaine-prilocaine (EMLA) cream Apply 1 application topically as needed. Apply one teaspoon topically to port 1-2 hours prior to access. 30 g 2  . Multiple Vitamin (MULTIVITAMIN) tablet Take 1 tablet by mouth daily.    Marland Kitchen tretinoin (RETIN-A) 0.1 % cream Apply 1 application topically at bedtime.    Marland Kitchen ZYTIGA 250 MG tablet TAKE 4 TABLETS BY MOUTH ONCE DAILY AS DIRECTED.  TAKE 1 HOUR BEFORE OR 2 HOURS AFTER A MEAL 120 tablet 11   No current facility-administered medications for this visit.      Allergies: No Known  Allergies  Past Medical History, Surgical history, Social history, and Family History remain unchanged on review.   Physical Exam:  Blood pressure (!) 157/88, pulse 72, temperature 97.7 F (36.5 C), temperature source Oral, resp. rate 18, height 6\' 4"  (1.93 m), weight 189 lb 9.6 oz (86 kg), SpO2 100 %.   ECOG: 0 General appearance: Alert, awake gentleman without distress. Head: Normocephalic without abnormalities. Oropharynx: No oral thrush or ulcers. Eyes: Nipples are equal and round  reactive to light. Lymph nodes: No lymphadenopathy noted in the cervical, axillary and supraclavicular lymph nodes.  Heart: Regular rate without any murmurs rubs or gallops. Lung: Clear that any rhonchi, wheezes or dullness to percussion. Abdomin: Soft, nontender without any rebound or guarding. Musculoskeletal: No clubbing or cyanosis. Neurological: Motor and sensory exam.  Intact. Skin: No ecchymosis or petechiae. Psychiatric: No changes in his mood and affect.     Lab Results: Lab Results  Component Value Date   WBC 5.7 07/22/2017   HGB 12.0 (L) 07/22/2017   HCT 36.2 (L) 07/22/2017   MCV 91.7 07/22/2017   PLT 226 07/22/2017     Chemistry      Component Value Date/Time   NA 141 07/22/2017 1235   NA 139 02/10/2017 1323   K 3.8 07/22/2017 1235   K 3.4 (L) 02/10/2017 1323   CL 107 07/22/2017 1235   CO2 26 07/22/2017 1235   CO2 23 02/10/2017 1323   BUN 15 07/22/2017 1235   BUN 13.6 02/10/2017 1323   CREATININE 0.84 07/22/2017 1235   CREATININE 0.9 02/10/2017 1323      Component Value Date/Time   CALCIUM 9.2 07/22/2017 1235   CALCIUM 9.2 02/10/2017 1323   ALKPHOS 66 07/22/2017 1235   ALKPHOS 50 02/10/2017 1323   AST 17 07/22/2017 1235   AST 20 02/10/2017 1323   ALT 14 07/22/2017 1235   ALT 15 02/10/2017 1323   BILITOT 0.3 07/22/2017 1235   BILITOT 0.52 02/10/2017 1323      Results for ERIEL, DOYON (MRN [id number]) as of 07/23/2017 15:29  Ref. Range 06/22/2017 13:24 07/22/2017 12:35  Prostate Specific Ag, Serum Latest Ref Range: 0.0 - 4.0 ng/mL 0.4 0.4    Impression and Plan:  72 year old man with:  1.  Castration-resistant prostate cancer with disease to the bone as well as adenopathy documented in 2016.    He is currently on Zytiga without prednisone without any complications.  His PSA continues to be under excellent control without any changes.  Risks and benefits of continuing this medication long-term was reviewed and he is agreeable to continue.   2. IV  access: Port-A-Cath will be used for laboratory testing in each visit.  No complications noted.  3. Hypertension: Blood pressure slightly elevated but for the most part within normal range.  4.  Androgen depravation: He continues to have castrate level testosterone and status post orchiectomy.  5.  Skeletal related events prevention: He is at risk of developing bone pain or pathological fractures.  He is currently on Xgeva to prevent that.  Long-term complication associated with this medicine was reviewed today is agreeable to continue it.  His complications include osteonecrosis of the jaw and hypocalcemia.  6. Left lower extremity edema: Unchanged from previous examination.  Related to prednisone as well as his previous tumor.  7.  Vertigo and hypokalemia: His potassium is back to normal range at this time.  This episode could be related to a viral illness and inner ear infection.  It appears to  have resolved and unrelated to [facility] or malignancy.  8.  Prognosis: He has incurable malignancy but disease that has been palliated and continue to be under control for the time being.  Aggressive therapy is warranted.  9. Follow-up: He will also be in 4 weeks.   25 minutes was spent with the patient face-to-face today.  More than 50% of time was dedicated to patient counseling, education and answering questions regarding diagnosis, prognosis and future plan of care.   Zola Button, MD 5/23/20193:54 PM

## 2017-07-23 NOTE — Telephone Encounter (Signed)
Scheduled appt per 5/23 los - Gave patient AVS and calender per los.  

## 2017-07-23 NOTE — Patient Instructions (Signed)

## 2017-08-06 ENCOUNTER — Telehealth: Payer: Self-pay | Admitting: Pharmacy Technician

## 2017-08-06 NOTE — Telephone Encounter (Signed)
Oral Oncology Patient Advocate Encounter  Met patient in [facility] lobby to complete application for Philip [name] and Philip [name] in an effort to keep patient's out of pocket expense for Philip [name] to $0.    He has been previously enrolled in [place] [employer], but is due for annual re-enrollment.  Mr. Beitler has no prescription drug coverage.   Application completed and faxed to [phone].   Philip [name] patient assistance phone number for follow up is [phone].   This encounter will be updated until final determination.  Philip [name]. Philip [name], Philip [name] Patient [place] [phone] 08/06/2017 11:43 AM

## 2017-08-18 NOTE — Telephone Encounter (Signed)
Oral Oncology Patient Advocate Encounter  Received notification from [place] and [place] Patient Assistance program that patient has been successfully re-enrolled into their program to continue to receive Zytiga from the manufacturer at $0 out of pocket until 08/11/2018.   I called and left a message for the patient with the details of his re-enrollment.   [name], CPhT, [place] Oral Oncology Patient Advocate [phone] 08/18/2017 3:12 PM

## 2017-08-25 ENCOUNTER — Inpatient Hospital Stay: Payer: Medicare Other

## 2017-08-25 ENCOUNTER — Inpatient Hospital Stay: Payer: Medicare Other | Attending: Oncology

## 2017-08-25 DIAGNOSIS — Z7982 Long term (current) use of aspirin: Secondary | ICD-10-CM | POA: Diagnosis not present

## 2017-08-25 DIAGNOSIS — C61 Malignant neoplasm of prostate: Secondary | ICD-10-CM

## 2017-08-25 DIAGNOSIS — I10 Essential (primary) hypertension: Secondary | ICD-10-CM | POA: Insufficient documentation

## 2017-08-25 DIAGNOSIS — Z79899 Other long term (current) drug therapy: Secondary | ICD-10-CM | POA: Insufficient documentation

## 2017-08-25 DIAGNOSIS — Z9079 Acquired absence of other genital organ(s): Secondary | ICD-10-CM | POA: Diagnosis not present

## 2017-08-25 DIAGNOSIS — R600 Localized edema: Secondary | ICD-10-CM | POA: Diagnosis not present

## 2017-08-25 DIAGNOSIS — Z79818 Long term (current) use of other agents affecting estrogen receptors and estrogen levels: Secondary | ICD-10-CM | POA: Diagnosis not present

## 2017-08-25 DIAGNOSIS — Z452 Encounter for adjustment and management of vascular access device: Secondary | ICD-10-CM | POA: Diagnosis not present

## 2017-08-25 DIAGNOSIS — C7951 Secondary malignant neoplasm of bone: Secondary | ICD-10-CM | POA: Insufficient documentation

## 2017-08-25 DIAGNOSIS — Z9221 Personal history of antineoplastic chemotherapy: Secondary | ICD-10-CM | POA: Insufficient documentation

## 2017-08-25 DIAGNOSIS — Z95828 Presence of other vascular implants and grafts: Secondary | ICD-10-CM

## 2017-08-25 LAB — CBC WITH DIFFERENTIAL (CANCER CENTER ONLY)
BASOS PCT: 0 %
Basophils Absolute: 0 10*3/uL (ref 0.0–0.1)
Eosinophils Absolute: 0.1 10*3/uL (ref 0.0–0.5)
Eosinophils Relative: 2 %
HCT: 36.3 % — ABNORMAL LOW (ref 38.4–49.9)
HEMOGLOBIN: 11.8 g/dL — AB (ref 13.0–17.1)
LYMPHS ABS: 1.9 10*3/uL (ref 0.9–3.3)
LYMPHS PCT: 35 %
MCH: 30.6 pg (ref 27.2–33.4)
MCHC: 32.5 g/dL (ref 32.0–36.0)
MCV: 94 fL (ref 79.3–98.0)
MONO ABS: 0.5 10*3/uL (ref 0.1–0.9)
MONOS PCT: 10 %
NEUTROS ABS: 2.8 10*3/uL (ref 1.5–6.5)
NEUTROS PCT: 53 %
Platelet Count: 201 10*3/uL (ref 140–400)
RBC: 3.86 MIL/uL — ABNORMAL LOW (ref 4.20–5.82)
RDW: 14.3 % (ref 11.0–14.6)
WBC Count: 5.4 10*3/uL (ref 4.0–10.3)

## 2017-08-25 LAB — CMP (CANCER CENTER ONLY)
ALK PHOS: 61 U/L (ref 38–126)
ALT: 14 U/L (ref 0–44)
AST: 19 U/L (ref 15–41)
Albumin: 3.7 g/dL (ref 3.5–5.0)
Anion gap: 7 (ref 5–15)
BUN: 17 mg/dL (ref 8–23)
CALCIUM: 9.4 mg/dL (ref 8.9–10.3)
CO2: 25 mmol/L (ref 22–32)
Chloride: 107 mmol/L (ref 98–111)
Creatinine: 0.87 mg/dL (ref 0.61–1.24)
Glucose, Bld: 120 mg/dL — ABNORMAL HIGH (ref 70–99)
Potassium: 4 mmol/L (ref 3.5–5.1)
Sodium: 139 mmol/L (ref 135–145)
TOTAL PROTEIN: 6.5 g/dL (ref 6.5–8.1)
Total Bilirubin: 0.4 mg/dL (ref 0.3–1.2)

## 2017-08-25 MED ORDER — SODIUM CHLORIDE 0.9% FLUSH
10.0000 mL | Freq: Once | INTRAVENOUS | Status: AC
  Administered 2017-08-25: 10 mL
  Filled 2017-08-25: qty 10

## 2017-08-25 MED ORDER — HEPARIN SOD (PORK) LOCK FLUSH 100 UNIT/ML IV SOLN
500.0000 [IU] | Freq: Once | INTRAVENOUS | Status: AC
  Administered 2017-08-25: 500 [IU]
  Filled 2017-08-25: qty 5

## 2017-08-26 LAB — PROSTATE-SPECIFIC AG, SERUM (LABCORP): Prostate Specific Ag, Serum: 0.3 ng/mL (ref 0.0–4.0)

## 2017-08-27 ENCOUNTER — Telehealth: Payer: Self-pay

## 2017-08-27 ENCOUNTER — Inpatient Hospital Stay: Payer: Medicare Other

## 2017-08-27 ENCOUNTER — Inpatient Hospital Stay (HOSPITAL_BASED_OUTPATIENT_CLINIC_OR_DEPARTMENT_OTHER): Payer: Medicare Other | Admitting: Oncology

## 2017-08-27 VITALS — BP 148/85 | HR 73 | Temp 98.7°F | Resp 18 | Ht 76.0 in | Wt 192.0 lb

## 2017-08-27 DIAGNOSIS — C7951 Secondary malignant neoplasm of bone: Secondary | ICD-10-CM

## 2017-08-27 DIAGNOSIS — Z79899 Other long term (current) drug therapy: Secondary | ICD-10-CM

## 2017-08-27 DIAGNOSIS — Z95828 Presence of other vascular implants and grafts: Secondary | ICD-10-CM

## 2017-08-27 DIAGNOSIS — C61 Malignant neoplasm of prostate: Secondary | ICD-10-CM | POA: Diagnosis not present

## 2017-08-27 DIAGNOSIS — Z79818 Long term (current) use of other agents affecting estrogen receptors and estrogen levels: Secondary | ICD-10-CM

## 2017-08-27 DIAGNOSIS — Z9221 Personal history of antineoplastic chemotherapy: Secondary | ICD-10-CM

## 2017-08-27 DIAGNOSIS — Z9079 Acquired absence of other genital organ(s): Secondary | ICD-10-CM

## 2017-08-27 DIAGNOSIS — Z7982 Long term (current) use of aspirin: Secondary | ICD-10-CM | POA: Diagnosis not present

## 2017-08-27 DIAGNOSIS — R600 Localized edema: Secondary | ICD-10-CM

## 2017-08-27 DIAGNOSIS — Z452 Encounter for adjustment and management of vascular access device: Secondary | ICD-10-CM | POA: Diagnosis not present

## 2017-08-27 DIAGNOSIS — I10 Essential (primary) hypertension: Secondary | ICD-10-CM

## 2017-08-27 MED ORDER — DENOSUMAB 120 MG/1.7ML ~~LOC~~ SOLN
SUBCUTANEOUS | Status: AC
  Filled 2017-08-27: qty 1.7

## 2017-08-27 MED ORDER — DENOSUMAB 120 MG/1.7ML ~~LOC~~ SOLN
120.0000 mg | Freq: Once | SUBCUTANEOUS | Status: AC
Start: 2017-08-27 — End: 2017-08-27
  Administered 2017-08-27: 120 mg via SUBCUTANEOUS

## 2017-08-27 NOTE — Progress Notes (Signed)
Hematology and Oncology Follow Up Visit  Philip [name] [id number] 1945-11-19 72 y.o. 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  He presented with advanced disease at the time and a PSA of 2900.   Prior Therapy: Status post orchiectomy done on November 2016.  Taxotere chemotherapy at 75 mg/m to start on 02/16/2015. He is completed 6 cycles of therapy in March 2017.  Current therapy: Zytiga 1000 mg daily with prednisone at 5 mg daily started on 09/13/2015.  Prednisone has been discontinued.  Interim History:  Philip [name] is here for a follow-up visit.  Since last visit, he reports no major changes or complaints.  He continues to enjoy excellent quality of life and performance status without any recent decline or hospitalizations.  He takes Zytiga on a daily basis without any nausea, excessive fatigue or edema.  He has left leg edema that has been chronic in nature since his diagnosis of prostate cancer.  He denies any bone pain or pathological fractures.  He denies any urination difficulties.  He denies any excessive fatigue or tiredness.  he does not report any headaches, blurry vision, syncope or seizures.  He denies any alteration in mental status or confusion.  He does not report any fevers, chills, sweats.  Appetite and weight is unchanged.  He does not report any chest pain, palpitation, orthopnea. Does not report any cough, wheezing, or shortness of breath. He does not report any nausea, vomiting, abdominal pain, hematochezia.  He denies any changes in bowel habits.  He does not report any frequency, urgency or hesitancy. Does not report any hematuria or dysuria. Does not report any lymphadenopathy or petechiae.   He does not report any skin rashes.  Denies any mood changes or increased anxiety.   Remaining review of systems is  negative.  Medications: I have reviewed the patient's current medications.   Current Outpatient Medications  Medication Sig Dispense Refill  . aspirin EC 81 MG tablet Take 81 mg by mouth daily.    Marland Kitchen atorvastatin (LIPITOR) 10 MG tablet Take 1 tablet (10 mg total) by mouth daily. 90 tablet 1  . BIOTIN 5000 PO Take 1 tablet by mouth daily.    . Calcium Carbonate-Vitamin D3 (CALCIUM 600+D3) 600-400 MG-UNIT TABS Take 1 tablet by mouth daily.    . Denosumab (XGEVA [place]) Inject into the skin.    . fexofenadine (ALLEGRA) 180 MG tablet Take 180 mg by mouth daily.    . fluticasone (FLONASE) 50 MCG/ACT nasal spray Place 2 sprays into both nostrils daily. 16 g 2  . hydrOXYzine (ATARAX/VISTARIL) 10 MG tablet Take 10 mg by mouth every 8 (eight) hours as needed.  10  . lidocaine-prilocaine (EMLA) cream Apply 1 application topically as needed. Apply one teaspoon topically to port 1-2 hours prior to access. 30 g 2  . Multiple Vitamin (MULTIVITAMIN) tablet Take 1 tablet by mouth daily.    Marland Kitchen tretinoin (RETIN-A) 0.1 % cream Apply 1 application topically at bedtime.    Marland Kitchen ZYTIGA 250 MG tablet TAKE 4 TABLETS BY MOUTH ONCE DAILY AS DIRECTED.  TAKE 1 HOUR BEFORE OR 2 HOURS AFTER A MEAL 120 tablet 11   No current facility-administered medications for this visit.      Allergies: No Known Allergies  Past Medical History, Surgical history, Social history, and Family History remain unchanged on review.   Physical Exam:  Blood pressure (!) 148/85, pulse 73, temperature 98.7 F (37.1 C), temperature source Oral, resp. rate 18, height 6\' 4"  (1.93 m), weight 192 lb (87.1 kg), SpO2 99 %.   ECOG: 0 General appearance: Well-appearing gentleman appeared comfortable. Head: Atraumatic without abnormalities. Oropharynx: Mucous membranes are moist and pink Eyes: Pupils are equal and round reactive to light. Lymph nodes: No cervical, axillary, axillary or supraclavicular adenopathy. Heart: Regular rate without any murmurs  or gallops.  Left leg edema more than [name]. Lung: Clear without any wheezing or dullness to percussion. Abdomin: Soft, nondistended without any rebound or guarding. Musculoskeletal: No joint deformity or effusion. Neurological: No deficits noted in the motor or sensory exam.  Normal gait. Skin: No rashes or lesions.      Lab Results: Lab Results  Component Value Date   WBC 5.4 08/25/2017   HGB 11.8 (L) 08/25/2017   HCT 36.3 (L) 08/25/2017   MCV 94.0 08/25/2017   PLT 201 08/25/2017     Chemistry      Component Value Date/Time   NA 139 08/25/2017 1312   NA 139 02/10/2017 1323   K 4.0 08/25/2017 1312   K 3.4 (L) 02/10/2017 1323   CL 107 08/25/2017 1312   CO2 25 08/25/2017 1312   CO2 23 02/10/2017 1323   BUN 17 08/25/2017 1312   BUN 13.6 02/10/2017 1323   CREATININE 0.87 08/25/2017 1312   CREATININE 0.9 02/10/2017 1323      Component Value Date/Time   CALCIUM 9.4 08/25/2017 1312   CALCIUM 9.2 02/10/2017 1323   ALKPHOS 61 08/25/2017 1312   ALKPHOS 50 02/10/2017 1323   AST 19 08/25/2017 1312   AST 20 02/10/2017 1323   ALT 14 08/25/2017 1312   ALT 15 02/10/2017 1323   BILITOT 0.4 08/25/2017 1312   BILITOT 0.52 02/10/2017 1323      Results for Philip [name], Philip [name] (MRN [id number]) as of 08/27/2017 15:36  Ref. Range 02/10/2017 13:23 03/18/2017 14:07 04/22/2017 13:07 05/20/2017 12:36 06/22/2017 13:24 07/22/2017 12:35 08/25/2017 13:12  Prostate Specific Ag, Serum Latest Ref Range: 0.0 - 4.0 ng/mL 0.8 0.8 0.5 0.4 0.4 0.4 0.3     Impression and Plan:  72 year old man with:  1.  Castration-resistant prostate cancer diagnosed in 2016 after presenting with advanced disease involving the bone and lymphadenopathy in PSA of 2900.     He remains on Zytiga without any complications at this time.  His PSA continues to decline without any signs or symptoms to suggest recurrent disease.  Risks and benefits of continuing this treatment was reviewed today and is agreeable to continue.   2. IV  access: Port-A-Cath will remain in use to obtain laboratory testing on a monthly basis.  3. Hypertension: His blood pressure is within normal range at this time.  4.  Androgen depravation: He is status post orchiectomy with testosterone level adequately castrate.  5.  Skeletal related events prevention: He remains on Xgeva and no complications related to this medication is noted.  Risks and benefits of continuing this treatment was reviewed today.  Complications include osteonecrosis of the jaw and hypocalcemia was reviewed.  He continues to have rigorous dental follow-up because of history of gum disease.  No dental surgery  6. Left lower extremity edema: Related to his prostate cancer lymph node involvement.  Unchanged from previous examination.  7.  Vertigo and hypokalemia: Resolved at this time and potassium is back to normal.  8.  Prognosis: His disease remains incurable but his performance status and activity  level remains excellent and aggressive therapy is warranted.  9. Follow-up: He will also be in 4 weeks.   15 minutes was spent with the patient face-to-face today.  More than 50% of time was dedicated to patient counseling, education and coordinating his future plan of care.   Zola Button, MD 6/27/20193:54 PM

## 2017-08-27 NOTE — Patient Instructions (Signed)

## 2017-08-27 NOTE — Telephone Encounter (Signed)
Printed avs and calender of upcoming appointment. Per 6/27 los 

## 2017-08-28 ENCOUNTER — Telehealth: Payer: Self-pay | Admitting: *Deleted

## 2017-08-28 NOTE — Telephone Encounter (Signed)
Yes. Please add injection appointment to his next MD

## 2017-08-28 NOTE — Telephone Encounter (Signed)
Patient called and stated,"does Dr. Alen Blew want me to continue Xgeva? When I checked out yesterday, there is no injection appointment made for 09/29/17. Return number is [phone].

## 2017-08-28 NOTE — Telephone Encounter (Signed)
Returned patient's phone call regarding schedule. Left message for him to call me back.

## 2017-08-31 ENCOUNTER — Other Ambulatory Visit: Payer: Self-pay | Admitting: Internal Medicine

## 2017-08-31 DIAGNOSIS — E785 Hyperlipidemia, unspecified: Secondary | ICD-10-CM

## 2017-08-31 DIAGNOSIS — I700 Atherosclerosis of aorta: Secondary | ICD-10-CM

## 2017-09-07 ENCOUNTER — Other Ambulatory Visit: Payer: Self-pay

## 2017-09-07 ENCOUNTER — Emergency Department (HOSPITAL_BASED_OUTPATIENT_CLINIC_OR_DEPARTMENT_OTHER)
Admission: EM | Admit: 2017-09-07 | Discharge: 2017-09-07 | Disposition: A | Discharge status: Home / Self Care | Payer: Medicare Other | Attending: Emergency Medicine | Admitting: Emergency Medicine

## 2017-09-07 ENCOUNTER — Encounter (HOSPITAL_BASED_OUTPATIENT_CLINIC_OR_DEPARTMENT_OTHER): Payer: Self-pay

## 2017-09-07 DIAGNOSIS — Z7982 Long term (current) use of aspirin: Secondary | ICD-10-CM | POA: Insufficient documentation

## 2017-09-07 DIAGNOSIS — Z79899 Other long term (current) drug therapy: Secondary | ICD-10-CM | POA: Insufficient documentation

## 2017-09-07 DIAGNOSIS — L237 Allergic contact dermatitis due to plants, except food: Secondary | ICD-10-CM

## 2017-09-07 DIAGNOSIS — Z85830 Personal history of malignant neoplasm of bone: Secondary | ICD-10-CM | POA: Diagnosis not present

## 2017-09-07 DIAGNOSIS — Z8546 Personal history of malignant neoplasm of prostate: Secondary | ICD-10-CM | POA: Insufficient documentation

## 2017-09-07 DIAGNOSIS — L989 Disorder of the skin and subcutaneous tissue, unspecified: Secondary | ICD-10-CM | POA: Diagnosis present

## 2017-09-07 DIAGNOSIS — L255 Unspecified contact dermatitis due to plants, except food: Secondary | ICD-10-CM | POA: Diagnosis not present

## 2017-09-07 MED ORDER — TRIAMCINOLONE ACETONIDE 0.1 % EX CREA: 1.0000 "application " | TOPICAL_CREAM | Freq: Two times a day (BID) | CUTANEOUS | 0 refills | Status: DC

## 2017-09-07 NOTE — Discharge Instructions (Addendum)
Contact a health care provider if: You have open sores in the rash area. You have more redness, swelling, or pain in the affected area. You have redness that spreads beyond the rash area. You have fluid, blood, or pus coming from the affected area. You have a fever. You have a rash over a large area of your body. You have a rash on your eyes, mouth, or genitals. Your rash does not improve after a few days. Get help right away if: Your face swells or your eyes swell shut. You have trouble breathing. You have trouble swallowing.

## 2017-09-07 NOTE — ED Provider Notes (Signed)
[facility] EMERGENCY DEPARTMENT Provider Note   CSN: [id number] Arrival date & time: 09/07/17  2019     History   Chief Complaint Chief Complaint  Patient presents with  . Blister    HPI Philip [name] is a 72 y.o. male who presents for evaluation of blister.  Patient states that he was pulling down poison ivy with gloved hands from his wall.  He developed vesicular eruption and erythema.  1 of the vesicles became a large tense bulla.  He has itching and burning but denies fever, chills.  HPI  Past Medical History:  Diagnosis Date  . Cancer ([place])    PROSTATE  . Edema leg Sept 28, 2016   left leg from foot to thigh, increasinly worse over last 4 weeks  . Hypoglycemia   . Swelling LAST 30 DAYS DR [place]   LEFT LEG AND FOOT    Patient Active Problem List   Diagnosis Date Noted  . Port-A-Cath in place 08/25/2017  . Hyperlipidemia LDL goal <100 03/16/2017  . Atherosclerosis of aorta ([place]) 03/11/2017  . Need for hepatitis C screening test 03/11/2017  . Deficiency anemia 03/11/2017  . Chronic idiopathic constipation 03/11/2017  . Seasonal allergic rhinitis due to pollen 03/11/2017  . Gastroesophageal reflux disease without esophagitis 03/11/2017  . Malignant neoplasm of bone ([place]) 10/16/2015  . Port catheter in place 06/21/2015  . Prostate cancer ([place]) 02/02/2015    Past Surgical History:  Procedure Laterality Date  . GUM SURGERY     TEETH IMPLANTS ALSO  . ORCHIECTOMY Bilateral 01/03/2015   Procedure: ORCHIECTOMY;  Surgeon: Alexis Frock, MD;  Location: [facility] ORS;  Service: Urology;  Laterality: Bilateral;        Home Medications    Prior to Admission medications   Medication Sig Start Date End Date Taking? Authorizing Provider  aspirin EC 81 MG tablet Take 81 mg by mouth daily.    [provider]  atorvastatin (LIPITOR) 10 MG tablet TAKE 1 TABLET BY MOUTH EVERY DAY 08/31/17   [name], MD  BIOTIN 5000 PO Take 1 tablet by mouth daily.  02/02/15   [provider]  Calcium Carbonate-Vitamin D3 (CALCIUM 600+D3) 600-400 MG-UNIT TABS Take 1 tablet by mouth daily. 02/02/15   [provider]  Denosumab (XGEVA ) Inject into the skin.    [provider]  fexofenadine (ALLEGRA) 180 MG tablet Take 180 mg by mouth daily.    [provider]  fluticasone (FLONASE) 50 MCG/ACT nasal spray Place 2 sprays into both nostrils daily. 05/14/17   [name], MD  hydrOXYzine (ATARAX/VISTARIL) 10 MG tablet Take 10 mg by mouth every 8 (eight) hours as needed. 06/03/17   [provider]  lidocaine-prilocaine (EMLA) cream Apply 1 application topically as needed. Apply one teaspoon topically to port 1-2 hours prior to access. 06/06/16   [name], MD  Multiple Vitamin (MULTIVITAMIN) tablet Take 1 tablet by mouth daily.    [provider]  tretinoin (RETIN-A) 0.1 % cream Apply 1 application topically at bedtime. 02/02/15   [provider]  triamcinolone cream (KENALOG) 0.1 % Apply 1 application topically 2 (two) times daily. 09/07/17   Nazaria Riesen, PA-C  ZYTIGA 250 MG tablet TAKE 4 TABLETS BY MOUTH ONCE DAILY AS DIRECTED.  TAKE 1 HOUR BEFORE OR 2 HOURS AFTER A MEAL 07/15/17   [name], MD    Family History Family History  Problem Relation Age of Onset  . CVA Mother   .  CAD Father   . Osteoarthritis Brother     Social History Social History   Tobacco Use  . Smoking status: Never Smoker  . Smokeless tobacco: Never Used  Substance Use Topics  . Alcohol use: Yes    Alcohol/week: 0.6 oz    Types: 1 Glasses of wine per week    Comment: occasional wine with dinner  . Drug use: No     Allergies   Prednisone   Review of Systems Review of Systems Ten systems reviewed and are negative for acute change, except as noted in the HPI.    Physical Exam Updated Vital Signs BP (!) 190/103 (BP Location: Left Arm)   Pulse 82   Temp 98.4 F (36.9 C) (Oral)   Resp 16   Ht  6\' 4"  (1.93 m)   Wt 88 kg (194 lb)   SpO2 100%   BMI 23.61 kg/m   Physical Exam  Constitutional: He appears well-developed and well-nourished. No distress.  HENT:  Head: Normocephalic and atraumatic.  Eyes: Conjunctivae are normal. No scleral icterus.  Neck: Normal range of motion. Neck supple.  Cardiovascular: Normal rate, regular rhythm and normal heart sounds.  Pulmonary/Chest: Effort normal and breath sounds normal. No respiratory distress.  Abdominal: Soft. There is no tenderness.  Musculoskeletal: He exhibits no edema.  Neurological: He is alert.  Skin: Skin is warm and dry. He is not diaphoretic.  Out erythema of the right medial forearm.  Central tense bulla about the size of a thimble without evidence of purulence.  Small vesicles surrounding.  No streaking.  Nontender.  Psychiatric: His behavior is normal.  Nursing note and vitals reviewed.    ED Treatments / Results  Labs (all labs ordered are listed, but only abnormal results are displayed) Labs Reviewed - No data to display  EKG None  Radiology No results found.  Procedures Procedures (including critical care time)  Medications Ordered in ED Medications - No data to display   Initial Impression / Assessment and Plan / ED Course  I have reviewed the triage vital signs and the nursing notes.  Pertinent labs & imaging results that were available during my care of the patient were reviewed by me and considered in my medical decision making (see chart for details).        Patient with Poison IVY dermatitis. Instructed to avoid offending agent and to use unscented soaps, lotions, and detergents. Will treat with kenalog.  No signs of secondary infection. Follow up with PCP in 2-3 days. Return precautions discussed. Pt is safe for discharge at this time.        Final Clinical Impressions(s) / ED Diagnoses   Final diagnoses:  Poison ivy    ED Discharge Orders        Ordered    triamcinolone  cream (KENALOG) 0.1 %  2 times daily     09/07/17 2231       Margarita Mail, PA-C 09/07/17 2316    Little, Wenda Overland, MD 09/09/17 1246

## 2017-09-07 NOTE — ED Triage Notes (Signed)
C/o 2 areas to right FA that looked like "bites" while working in yard Saturday -today areas are blisters with redness-NAD-steady gait

## 2017-09-29 ENCOUNTER — Inpatient Hospital Stay: Payer: Medicare Other | Attending: Oncology

## 2017-09-29 ENCOUNTER — Inpatient Hospital Stay: Payer: Medicare Other

## 2017-09-29 DIAGNOSIS — Z95828 Presence of other vascular implants and grafts: Secondary | ICD-10-CM

## 2017-09-29 DIAGNOSIS — Z9079 Acquired absence of other genital organ(s): Secondary | ICD-10-CM | POA: Insufficient documentation

## 2017-09-29 DIAGNOSIS — I10 Essential (primary) hypertension: Secondary | ICD-10-CM | POA: Diagnosis not present

## 2017-09-29 DIAGNOSIS — Z452 Encounter for adjustment and management of vascular access device: Secondary | ICD-10-CM | POA: Diagnosis not present

## 2017-09-29 DIAGNOSIS — R600 Localized edema: Secondary | ICD-10-CM | POA: Diagnosis not present

## 2017-09-29 DIAGNOSIS — C7951 Secondary malignant neoplasm of bone: Secondary | ICD-10-CM | POA: Insufficient documentation

## 2017-09-29 DIAGNOSIS — C61 Malignant neoplasm of prostate: Secondary | ICD-10-CM

## 2017-09-29 DIAGNOSIS — Z7982 Long term (current) use of aspirin: Secondary | ICD-10-CM | POA: Diagnosis not present

## 2017-09-29 DIAGNOSIS — Z79899 Other long term (current) drug therapy: Secondary | ICD-10-CM | POA: Diagnosis not present

## 2017-09-29 LAB — CBC WITH DIFFERENTIAL (CANCER CENTER ONLY)
BASOS ABS: 0 10*3/uL (ref 0.0–0.1)
Basophils Relative: 0 %
EOS ABS: 0.1 10*3/uL (ref 0.0–0.5)
EOS PCT: 2 %
HCT: 38.2 % — ABNORMAL LOW (ref 38.4–49.9)
Hemoglobin: 12.4 g/dL — ABNORMAL LOW (ref 13.0–17.1)
LYMPHS PCT: 32 %
Lymphs Abs: 2.2 10*3/uL (ref 0.9–3.3)
MCH: 30.5 pg (ref 27.2–33.4)
MCHC: 32.5 g/dL (ref 32.0–36.0)
MCV: 93.9 fL (ref 79.3–98.0)
MONO ABS: 0.5 10*3/uL (ref 0.1–0.9)
Monocytes Relative: 7 %
Neutro Abs: 4.1 10*3/uL (ref 1.5–6.5)
Neutrophils Relative %: 59 %
Platelet Count: 214 10*3/uL (ref 140–400)
RBC: 4.07 MIL/uL — ABNORMAL LOW (ref 4.20–5.82)
RDW: 14.4 % (ref 11.0–14.6)
WBC Count: 7 10*3/uL (ref 4.0–10.3)

## 2017-09-29 LAB — CMP (CANCER CENTER ONLY)
ALBUMIN: 3.8 g/dL (ref 3.5–5.0)
ALK PHOS: 62 U/L (ref 38–126)
ALT: 13 U/L (ref 0–44)
AST: 18 U/L (ref 15–41)
Anion gap: 7 (ref 5–15)
BILIRUBIN TOTAL: 0.4 mg/dL (ref 0.3–1.2)
BUN: 19 mg/dL (ref 8–23)
CALCIUM: 9.8 mg/dL (ref 8.9–10.3)
CO2: 26 mmol/L (ref 22–32)
CREATININE: 0.91 mg/dL (ref 0.61–1.24)
Chloride: 107 mmol/L (ref 98–111)
GFR, Est AFR Am: >60 mL/min (ref >60)
GLUCOSE: 104 mg/dL — AB (ref 70–99)
POTASSIUM: 3.9 mmol/L (ref 3.5–5.1)
Sodium: 140 mmol/L (ref 135–145)
TOTAL PROTEIN: 6.6 g/dL (ref 6.5–8.1)

## 2017-09-29 MED ORDER — SODIUM CHLORIDE 0.9% FLUSH
10.0000 mL | Freq: Once | INTRAVENOUS | Status: AC
  Administered 2017-09-29: 10 mL
  Filled 2017-09-29: qty 10

## 2017-09-29 MED ORDER — HEPARIN SOD (PORK) LOCK FLUSH 100 UNIT/ML IV SOLN
500.0000 [IU] | Freq: Once | INTRAVENOUS | Status: AC
  Administered 2017-09-29: 500 [IU]
  Filled 2017-09-29: qty 5

## 2017-09-30 ENCOUNTER — Inpatient Hospital Stay: Payer: Medicare Other

## 2017-09-30 ENCOUNTER — Inpatient Hospital Stay (HOSPITAL_BASED_OUTPATIENT_CLINIC_OR_DEPARTMENT_OTHER): Payer: Medicare Other | Admitting: Oncology

## 2017-09-30 ENCOUNTER — Telehealth: Payer: Self-pay | Admitting: Oncology

## 2017-09-30 VITALS — BP 170/89 | HR 80 | Temp 97.9°F | Resp 17 | Wt 195.1 lb

## 2017-09-30 DIAGNOSIS — I10 Essential (primary) hypertension: Secondary | ICD-10-CM

## 2017-09-30 DIAGNOSIS — C7951 Secondary malignant neoplasm of bone: Secondary | ICD-10-CM

## 2017-09-30 DIAGNOSIS — Z95828 Presence of other vascular implants and grafts: Secondary | ICD-10-CM

## 2017-09-30 DIAGNOSIS — Z9079 Acquired absence of other genital organ(s): Secondary | ICD-10-CM

## 2017-09-30 DIAGNOSIS — Z7982 Long term (current) use of aspirin: Secondary | ICD-10-CM

## 2017-09-30 DIAGNOSIS — R600 Localized edema: Secondary | ICD-10-CM | POA: Diagnosis not present

## 2017-09-30 DIAGNOSIS — Z452 Encounter for adjustment and management of vascular access device: Secondary | ICD-10-CM | POA: Diagnosis not present

## 2017-09-30 DIAGNOSIS — C61 Malignant neoplasm of prostate: Secondary | ICD-10-CM

## 2017-09-30 DIAGNOSIS — Z79899 Other long term (current) drug therapy: Secondary | ICD-10-CM | POA: Diagnosis not present

## 2017-09-30 LAB — PROSTATE-SPECIFIC AG, SERUM (LABCORP): Prostate Specific Ag, Serum: 0.3 ng/mL (ref 0.0–4.0)

## 2017-09-30 MED ORDER — DENOSUMAB 120 MG/1.7ML ~~LOC~~ SOLN
120.0000 mg | Freq: Once | SUBCUTANEOUS | Status: AC
  Administered 2017-09-30: 120 mg via SUBCUTANEOUS

## 2017-09-30 MED ORDER — DENOSUMAB 120 MG/1.7ML ~~LOC~~ SOLN
SUBCUTANEOUS | Status: AC
Start: 2017-09-30 — End: ?
  Filled 2017-09-30: qty 1.7

## 2017-09-30 NOTE — Progress Notes (Signed)
Hematology and Oncology Follow Up Visit  Philip [name] [id number] June 28, 1945 72 y.o. 09/30/2017 1:12 PM [name], MDJones, [name], MD   Principle Diagnosis: 72 year old man with prostate cancer diagnosed in 2016 after presenting with advanced disease to the bone and lymphadenopathy.  And a PSA of 2900.  He has currently castration-resistant disease.     Prior Therapy: Status post orchiectomy done on November 2016.  Taxotere chemotherapy at 75 mg/m to start on 02/16/2015. He is completed 6 cycles of therapy in March 2017.  Current therapy: Zytiga 1000 mg daily with prednisone at 5 mg daily started on 09/13/2015.  He is currently on Zytiga alone without prednisone.  Interim History:  Philip [name] presents today for a follow-up.  Since the last visit, he reports no major changes in his health.  He did have an episode with poison ivy involving his right wrist that has resolved after applying topical steroids.  He continues to take Zytiga without any complications related to this medication.  He denies any nausea or excessive fatigue.  Continues to eat well without any worsening edema.  Not any bone pain or pathological fractures.  He denies any constitutional symptoms.  He does not report any headaches, blurry vision, syncope or seizures.  He denies any confusion or dizziness.  He does not report any fevers, chills, sweats.  He does not report any chest pain, palpitation, orthopnea. Does not report any cough, wheezing, or shortness of breath. He does not report any nausea, vomiting, abdominal pain, hematochezia.  He denies any constipation or diarrhea.  He does not report any frequency, urgency or hesitancy. Does not report any hematuria or dysuria. Does not report any eating or clotting tendencies.   He does not report any skin rashes.  Denies any anxiety or depression.   Remaining review of systems is negative.  Medications: I have reviewed the patient's current medications.   Current  Outpatient Medications  Medication Sig Dispense Refill  . aspirin EC 81 MG tablet Take 81 mg by mouth daily.    Marland Kitchen atorvastatin (LIPITOR) 10 MG tablet TAKE 1 TABLET BY MOUTH EVERY DAY 90 tablet 1  . BIOTIN 5000 PO Take 1 tablet by mouth daily.    . Calcium Carbonate-Vitamin D3 (CALCIUM 600+D3) 600-400 MG-UNIT TABS Take 1 tablet by mouth daily.    . Denosumab (XGEVA [place]) Inject into the skin.    . fexofenadine (ALLEGRA) 180 MG tablet Take 180 mg by mouth daily.    . fluticasone (FLONASE) 50 MCG/ACT nasal spray Place 2 sprays into both nostrils daily. 16 g 2  . hydrOXYzine (ATARAX/VISTARIL) 10 MG tablet Take 10 mg by mouth every 8 (eight) hours as needed.  10  . lidocaine-prilocaine (EMLA) cream Apply 1 application topically as needed. Apply one teaspoon topically to port 1-2 hours prior to access. 30 g 2  . Multiple Vitamin (MULTIVITAMIN) tablet Take 1 tablet by mouth daily.    Marland Kitchen tretinoin (RETIN-A) 0.1 % cream Apply 1 application topically at bedtime.    . triamcinolone cream (KENALOG) 0.1 % Apply 1 application topically 2 (two) times daily. 80 g 0  . ZYTIGA 250 MG tablet TAKE 4 TABLETS BY MOUTH ONCE DAILY AS DIRECTED.  TAKE 1 HOUR BEFORE OR 2 HOURS AFTER A MEAL 120 tablet 11   No current facility-administered medications for this visit.    Facility-Administered Medications Ordered in Other Visits  Medication Dose Route Frequency Provider Last Rate Last Dose  . denosumab (XGEVA) injection 120 mg  120 mg Subcutaneous Once [name], MD         Allergies:  Allergies  Allergen Reactions  . Prednisone Anxiety    Past Medical History, Surgical history, Social history, and Family History remain unchanged on review.   Physical Exam:  Blood pressure (!) 170/89, pulse 80, temperature 97.9 F (36.6 C), temperature source Oral, resp. rate 17, weight 195 lb 2 oz (88.5 kg), SpO2 100 %.    ECOG: 0 General appearance: Alert, awake gentleman without distress. Head: Cephalic without  abnormalities. Oropharynx: No oral thrush or ulcers. Eyes: No scleral icterus. Lymph nodes: No lymphadenopathy noted in the cervical, axillary, axillary or supraclavicular regions. Heart: Regular rate rhythm without any murmurs.  Leg edema left side unchanged. Lung: Clear without any rhonchi, wheezes or dullness to percussion. Abdomin: Soft, tender guarding.  No shifting dullness or ascites. Musculoskeletal: No clubbing or cyanosis. Neurological: No motor or sensory deficits noted. Skin: No ecchymosis or petechiae.  Mild erythema noted on his right wrist.      Lab Results: Lab Results  Component Value Date   WBC 7.0 09/29/2017   HGB 12.4 (L) 09/29/2017   HCT 38.2 (L) 09/29/2017   MCV 93.9 09/29/2017   PLT 214 09/29/2017     Chemistry      Component Value Date/Time   NA 140 09/29/2017 1330   NA 139 02/10/2017 1323   K 3.9 09/29/2017 1330   K 3.4 (L) 02/10/2017 1323   CL 107 09/29/2017 1330   CO2 26 09/29/2017 1330   CO2 23 02/10/2017 1323   BUN 19 09/29/2017 1330   BUN 13.6 02/10/2017 1323   CREATININE 0.91 09/29/2017 1330   CREATININE 0.9 02/10/2017 1323      Component Value Date/Time   CALCIUM 9.8 09/29/2017 1330   CALCIUM 9.2 02/10/2017 1323   ALKPHOS 62 09/29/2017 1330   ALKPHOS 50 02/10/2017 1323   AST 18 09/29/2017 1330   AST 20 02/10/2017 1323   ALT 13 09/29/2017 1330   ALT 15 02/10/2017 1323   BILITOT 0.4 09/29/2017 1330   BILITOT 0.52 02/10/2017 1323     Results for Philip [name], Philip [name] (MRN [id number]) as of 09/30/2017 13:12  Ref. Range 08/25/2017 13:12 09/29/2017 13:30  Prostate Specific Ag, Serum Latest Ref Range: 0.0 - 4.0 ng/mL 0.3 0.3       Impression and Plan:  72 year old man with:  1.  Castration-resistant prostate cancer with disease to the bone and lymphadenopathy with initial diagnosis in 2016.    He remains on Zytiga without prednisone with excellent response to therapy and PSA remains under excellent control.  Risks and benefits of  continuing this medication was reviewed today.  Long-term complications were also discussed.  At this time we will continue with the same dose and schedule and repeat imaging studies in November 2019.  2. IV access: Port-A-Cath will be flushed on a monthly basis.  3. Hypertension: His blood pressure is mildly elevated today.  I have asked him to check his blood pressure between visits.  He might require starting of antihypertensive medication.  4.  Androgen depravation: His testosterone remains castrate at this time.  He is status post orchiectomy.  5.  Skeletal related events prevention: He will continue to receive Xgeva on a monthly basis.  Risks and benefits of this medication was reviewed the is agreeable to continue.  Osteonecrosis of the jaw and hypocalcemia risk were assessed.  6. Left lower extremity edema: It is related to his prostate cancer metastasis  and appears to be slowly improving.  7.  Prognosis: He has incurable disease but disease can be palliated effectively.  His performance status remain excellent and aggressive therapy is warranted.  8. Follow-up: He will also be in 4 weeks.   15 minutes was spent with the patient face-to-face today.  More than 50% of time was dedicated to discussing the natural course of his disease and his treatment approach.  All his questions were answered today including educating him about treatment options for prostate cancer.   Zola Button, MD 7/31/20191:12 PM

## 2017-09-30 NOTE — Telephone Encounter (Signed)
Appointments scheduled AVS/Calendar printed per 7/31 los °

## 2017-11-03 ENCOUNTER — Inpatient Hospital Stay: Payer: Medicare Other

## 2017-11-03 ENCOUNTER — Inpatient Hospital Stay: Payer: Medicare Other | Attending: Oncology

## 2017-11-03 DIAGNOSIS — Z79899 Other long term (current) drug therapy: Secondary | ICD-10-CM | POA: Diagnosis not present

## 2017-11-03 DIAGNOSIS — C61 Malignant neoplasm of prostate: Secondary | ICD-10-CM | POA: Diagnosis not present

## 2017-11-03 DIAGNOSIS — I10 Essential (primary) hypertension: Secondary | ICD-10-CM | POA: Diagnosis not present

## 2017-11-03 DIAGNOSIS — Z9079 Acquired absence of other genital organ(s): Secondary | ICD-10-CM | POA: Diagnosis not present

## 2017-11-03 DIAGNOSIS — Z79818 Long term (current) use of other agents affecting estrogen receptors and estrogen levels: Secondary | ICD-10-CM | POA: Insufficient documentation

## 2017-11-03 DIAGNOSIS — Z7982 Long term (current) use of aspirin: Secondary | ICD-10-CM | POA: Insufficient documentation

## 2017-11-03 DIAGNOSIS — C7951 Secondary malignant neoplasm of bone: Secondary | ICD-10-CM | POA: Diagnosis not present

## 2017-11-03 DIAGNOSIS — Z95828 Presence of other vascular implants and grafts: Secondary | ICD-10-CM

## 2017-11-03 LAB — CBC WITH DIFFERENTIAL (CANCER CENTER ONLY)
Basophils Absolute: 0 10*3/uL (ref 0.0–0.1)
Basophils Relative: 1 %
EOS PCT: 1 %
Eosinophils Absolute: 0.1 10*3/uL (ref 0.0–0.5)
HEMATOCRIT: 36.3 % — AB (ref 38.4–49.9)
Hemoglobin: 12 g/dL — ABNORMAL LOW (ref 13.0–17.1)
LYMPHS ABS: 1.6 10*3/uL (ref 0.9–3.3)
LYMPHS PCT: 31 %
MCH: 30.6 pg (ref 27.2–33.4)
MCHC: 33.1 g/dL (ref 32.0–36.0)
MCV: 92.6 fL (ref 79.3–98.0)
MONO ABS: 0.5 10*3/uL (ref 0.1–0.9)
MONOS PCT: 10 %
Neutro Abs: 3 10*3/uL (ref 1.5–6.5)
Neutrophils Relative %: 57 %
Platelet Count: 191 10*3/uL (ref 140–400)
RBC: 3.92 MIL/uL — ABNORMAL LOW (ref 4.20–5.82)
RDW: 13.8 % (ref 11.0–14.6)
WBC Count: 5.2 10*3/uL (ref 4.0–10.3)

## 2017-11-03 LAB — CMP (CANCER CENTER ONLY)
ALBUMIN: 3.6 g/dL (ref 3.5–5.0)
ALT: 13 U/L (ref 0–44)
ANION GAP: 7 (ref 5–15)
AST: 18 U/L (ref 15–41)
Alkaline Phosphatase: 57 U/L (ref 38–126)
BILIRUBIN TOTAL: 0.4 mg/dL (ref 0.3–1.2)
BUN: 18 mg/dL (ref 8–23)
CO2: 25 mmol/L (ref 22–32)
Calcium: 9 mg/dL (ref 8.9–10.3)
Chloride: 107 mmol/L (ref 98–111)
Creatinine: 0.81 mg/dL (ref 0.61–1.24)
GFR, Est AFR Am: >60 mL/min (ref >60)
GFR, Estimated: >60 mL/min (ref >60)
GLUCOSE: 120 mg/dL — AB (ref 70–99)
POTASSIUM: 3.6 mmol/L (ref 3.5–5.1)
Sodium: 139 mmol/L (ref 135–145)
TOTAL PROTEIN: 6.3 g/dL — AB (ref 6.5–8.1)

## 2017-11-03 MED ORDER — HEPARIN SOD (PORK) LOCK FLUSH 100 UNIT/ML IV SOLN
500.0000 [IU] | Freq: Once | INTRAVENOUS | Status: AC
  Administered 2017-11-03: 500 [IU]
  Filled 2017-11-03: qty 5

## 2017-11-03 MED ORDER — SODIUM CHLORIDE 0.9% FLUSH
10.0000 mL | Freq: Once | INTRAVENOUS | Status: AC
  Administered 2017-11-03: 10 mL
  Filled 2017-11-03: qty 10

## 2017-11-04 LAB — PROSTATE-SPECIFIC AG, SERUM (LABCORP): Prostate Specific Ag, Serum: 0.2 ng/mL (ref 0.0–4.0)

## 2017-11-05 ENCOUNTER — Inpatient Hospital Stay: Payer: Medicare Other

## 2017-11-05 ENCOUNTER — Inpatient Hospital Stay (HOSPITAL_BASED_OUTPATIENT_CLINIC_OR_DEPARTMENT_OTHER): Payer: Medicare Other | Admitting: Oncology

## 2017-11-05 ENCOUNTER — Telehealth: Payer: Self-pay

## 2017-11-05 VITALS — BP 158/78 | HR 71 | Temp 97.7°F | Resp 18 | Ht 76.0 in | Wt 194.1 lb

## 2017-11-05 DIAGNOSIS — Z79818 Long term (current) use of other agents affecting estrogen receptors and estrogen levels: Secondary | ICD-10-CM | POA: Diagnosis not present

## 2017-11-05 DIAGNOSIS — C7951 Secondary malignant neoplasm of bone: Secondary | ICD-10-CM | POA: Diagnosis not present

## 2017-11-05 DIAGNOSIS — Z79899 Other long term (current) drug therapy: Secondary | ICD-10-CM | POA: Diagnosis not present

## 2017-11-05 DIAGNOSIS — Z7982 Long term (current) use of aspirin: Secondary | ICD-10-CM

## 2017-11-05 DIAGNOSIS — C61 Malignant neoplasm of prostate: Secondary | ICD-10-CM

## 2017-11-05 DIAGNOSIS — Z9079 Acquired absence of other genital organ(s): Secondary | ICD-10-CM

## 2017-11-05 DIAGNOSIS — I10 Essential (primary) hypertension: Secondary | ICD-10-CM

## 2017-11-05 DIAGNOSIS — Z95828 Presence of other vascular implants and grafts: Secondary | ICD-10-CM

## 2017-11-05 MED ORDER — DENOSUMAB 120 MG/1.7ML ~~LOC~~ SOLN
120.0000 mg | Freq: Once | SUBCUTANEOUS | Status: AC
  Administered 2017-11-05: 120 mg via SUBCUTANEOUS

## 2017-11-05 MED ORDER — DENOSUMAB 120 MG/1.7ML ~~LOC~~ SOLN
SUBCUTANEOUS | Status: AC
  Filled 2017-11-05: qty 1.7

## 2017-11-05 NOTE — Telephone Encounter (Signed)
Printed avs and calender of [facility] appointment. Per 9/5 los Sorrel asked that injection be added or 10/9 after MD visit. She will be sending requesting in writing by sch msg per

## 2017-11-05 NOTE — Progress Notes (Signed)
Hematology and Oncology Follow Up Visit  Philip [name] [id number] 26-Aug-1945 72 y.o. 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  He presented with PSA of 2900 and advanced disease at presentation.   Prior Therapy: Status post orchiectomy done on November 2016.  Taxotere chemotherapy at 75 mg/m to start on 02/16/2015. He is completed 6 cycles of therapy in March 2017.  Current therapy: Zytiga 1000 mg daily with prednisone at 5 mg daily started on 09/13/2015.  Prednisone was discontinued due to poor tolerance.  Interim History:  Philip [name] is here for a follow-up visit by himself.  Since the last visit, he reports no major changes in his health.  He continues to tolerate Zytiga without any major complications.  He denies any excessive fatigue, tiredness or nausea.  He remains active and continues to attend to activities of daily living.  He exercises regularly and continues to enjoy excellent performance status.  He denies any complications related to Zytiga.  He does not report any headaches, blurry vision, syncope or seizures.  He denies any alteration in mental status or dizziness.  He does not report any fevers, chills, sweats.  He does not report any chest pain, palpitation, orthopnea. Does not report any cough, wheezing, or shortness of breath. He does not report any nausea, vomiting, abdominal pain.  He denies any changes in his bowel habits.  He denies any constipation or diarrhea.  He does not report any frequency, urgency or hesitancy. Does not report any hematuria or dysuria.   He does not report any skin rashes.  Denies any mood changes.   Remaining review of systems is negative.  Medications: I have reviewed the patient's current medications.   Current Outpatient Medications  Medication Sig Dispense Refill  . aspirin EC 81 MG  tablet Take 81 mg by mouth daily.    Marland Kitchen atorvastatin (LIPITOR) 10 MG tablet TAKE 1 TABLET BY MOUTH EVERY DAY 90 tablet 1  . BIOTIN 5000 PO Take 1 tablet by mouth daily.    . Calcium Carbonate-Vitamin D3 (CALCIUM 600+D3) 600-400 MG-UNIT TABS Take 1 tablet by mouth daily.    . Denosumab (XGEVA [place]) Inject into the skin.    . fexofenadine (ALLEGRA) 180 MG tablet Take 180 mg by mouth daily.    . fluticasone (FLONASE) 50 MCG/ACT nasal spray Place 2 sprays into both nostrils daily. 16 g 2  . hydrOXYzine (ATARAX/VISTARIL) 10 MG tablet Take 10 mg by mouth every 8 (eight) hours as needed.  10  . lidocaine-prilocaine (EMLA) cream Apply 1 application topically as needed. Apply one teaspoon topically to port 1-2 hours prior to access. 30 g 2  . Multiple Vitamin (MULTIVITAMIN) tablet Take 1 tablet by mouth daily.    Marland Kitchen tretinoin (RETIN-A) 0.1 % cream Apply 1 application topically at bedtime.    . triamcinolone cream (KENALOG) 0.1 % Apply 1 application topically 2 (two) times daily. 80 g 0  . ZYTIGA 250 MG tablet TAKE 4 TABLETS BY MOUTH ONCE DAILY AS DIRECTED.  TAKE 1 HOUR BEFORE OR 2 HOURS AFTER A MEAL 120 tablet 11   No current facility-administered medications for this visit.    Facility-Administered Medications Ordered in Other Visits  Medication Dose Route Frequency Provider Last Rate Last Dose  . denosumab (XGEVA) injection 120 mg  120 mg Subcutaneous Once [name], MD  Allergies:  Allergies  Allergen Reactions  . Prednisone Anxiety    Past Medical History, Surgical history, Social history, and Family History remain unchanged on review.   Physical Exam:  Blood pressure (!) 158/78, pulse 71, temperature 97.7 F (36.5 C), temperature source Oral, resp. rate 18, height 6\' 4"  (1.93 m), weight 194 lb 1.6 oz (88 kg), SpO2 100 %.    ECOG: 0   General appearance: Comfortable appearing without any discomfort Head: Normocephalic without any trauma Oropharynx: Mucous membranes are  moist and pink without any thrush or ulcers. Eyes: Pupils are equal and round reactive to light. Lymph nodes: No cervical, supraclavicular, inguinal or axillary lymphadenopathy.   Heart:regular rate and rhythm.  S1 and S2 without leg edema. Lung: Clear without any rhonchi or wheezes.  No dullness to percussion. Abdomin: Soft, nontender, nondistended with good bowel sounds.  No hepatosplenomegaly. Musculoskeletal: No joint deformity or effusion.  Full range of motion noted. Neurological: No deficits noted on motor, sensory and deep tendon reflex exam. Skin: No petechial rash or dryness.  Appeared moist.        Lab Results: Lab Results  Component Value Date   WBC 5.2 11/03/2017   HGB 12.0 (L) 11/03/2017   HCT 36.3 (L) 11/03/2017   MCV 92.6 11/03/2017   PLT 191 11/03/2017     Chemistry      Component Value Date/Time   NA 139 11/03/2017 1351   NA 139 02/10/2017 1323   K 3.6 11/03/2017 1351   K 3.4 (L) 02/10/2017 1323   CL 107 11/03/2017 1351   CO2 25 11/03/2017 1351   CO2 23 02/10/2017 1323   BUN 18 11/03/2017 1351   BUN 13.6 02/10/2017 1323   CREATININE 0.81 11/03/2017 1351   CREATININE 0.9 02/10/2017 1323      Component Value Date/Time   CALCIUM 9.0 11/03/2017 1351   CALCIUM 9.2 02/10/2017 1323   ALKPHOS 57 11/03/2017 1351   ALKPHOS 50 02/10/2017 1323   AST 18 11/03/2017 1351   AST 20 02/10/2017 1323   ALT 13 11/03/2017 1351   ALT 15 02/10/2017 1323   BILITOT 0.4 11/03/2017 1351   BILITOT 0.52 02/10/2017 1323       Results for [name], Philip (MRN [id number]) as of 11/05/2017 15:51  Ref. Range 09/29/2017 13:30 11/03/2017 13:51  Prostate Specific Ag, Serum Latest Ref Range: 0.0 - 4.0 ng/mL 0.3 0.2      Impression and Plan:  71 year old man with:  1.  Advanced prostate cancer with disease to the bone and lymphadenopathy that is currently castration-resistant diagnosed in 2016.    He continues to tolerate Zytiga very well without any complications.  His PSA  continues to show excellent response without any complications.  Risks and benefits and long-term complications of this medication was reviewed and is agreeable to continue.  2. IV access: Port-A-Cath remains in place and will be flushed periodically.  No complications noted.  3. Hypertension: His ambulatory blood pressure monitoring has been within normal range.  His mild elevation of blood pressure is noted only on his clinic visits.  4.  Androgen depravation: He is status post orchiectomy with castrate level testosterone.  5.  Skeletal related events prevention: He reports no complications related to Xgeva.  Risks and benefits of continuing this treatment was reviewed today and is agreeable to continue.  6.  Prognosis: Goal of therapy is palliative although he has excellent performance status and aggressive therapy is warranted.  7. Follow-up: He will also be in  4 weeks.   15 minutes was spent with the patient face-to-face today.  More than 50% of time was dedicated to reviewing the natural course of his disease, laboratory data and coordinating his plan of care.Zola Button, MD 9/5/20193:32 PM

## 2017-11-05 NOTE — Patient Instructions (Signed)

## 2017-11-05 NOTE — Progress Notes (Signed)
Pt. Tolerated injection well, No further problem or concern noted.

## 2017-11-16 DIAGNOSIS — H2513 Age-related nuclear cataract, bilateral: Secondary | ICD-10-CM | POA: Diagnosis not present

## 2017-11-16 DIAGNOSIS — H25013 Cortical age-related cataract, bilateral: Secondary | ICD-10-CM | POA: Diagnosis not present

## 2017-11-23 DIAGNOSIS — Z23 Encounter for immunization: Secondary | ICD-10-CM | POA: Diagnosis not present

## 2017-11-26 DIAGNOSIS — L814 Other melanin hyperpigmentation: Secondary | ICD-10-CM | POA: Diagnosis not present

## 2017-11-26 DIAGNOSIS — L821 Other seborrheic keratosis: Secondary | ICD-10-CM | POA: Diagnosis not present

## 2017-11-26 DIAGNOSIS — D1801 Hemangioma of skin and subcutaneous tissue: Secondary | ICD-10-CM | POA: Diagnosis not present

## 2017-11-26 DIAGNOSIS — L570 Actinic keratosis: Secondary | ICD-10-CM | POA: Diagnosis not present

## 2017-12-08 ENCOUNTER — Inpatient Hospital Stay: Payer: Medicare Other

## 2017-12-08 ENCOUNTER — Inpatient Hospital Stay: Payer: Medicare Other | Attending: Oncology

## 2017-12-08 DIAGNOSIS — Z9221 Personal history of antineoplastic chemotherapy: Secondary | ICD-10-CM | POA: Insufficient documentation

## 2017-12-08 DIAGNOSIS — Z79899 Other long term (current) drug therapy: Secondary | ICD-10-CM | POA: Insufficient documentation

## 2017-12-08 DIAGNOSIS — Z95828 Presence of other vascular implants and grafts: Secondary | ICD-10-CM

## 2017-12-08 DIAGNOSIS — C61 Malignant neoplasm of prostate: Secondary | ICD-10-CM

## 2017-12-08 DIAGNOSIS — Z9079 Acquired absence of other genital organ(s): Secondary | ICD-10-CM | POA: Insufficient documentation

## 2017-12-08 DIAGNOSIS — C7951 Secondary malignant neoplasm of bone: Secondary | ICD-10-CM | POA: Insufficient documentation

## 2017-12-08 DIAGNOSIS — E348 Other specified endocrine disorders: Secondary | ICD-10-CM | POA: Diagnosis not present

## 2017-12-08 DIAGNOSIS — I10 Essential (primary) hypertension: Secondary | ICD-10-CM | POA: Diagnosis not present

## 2017-12-08 LAB — CBC WITH DIFFERENTIAL (CANCER CENTER ONLY)
ABS IMMATURE GRANULOCYTES: 0.01 10*3/uL (ref 0.00–0.07)
BASOS ABS: 0 10*3/uL (ref 0.0–0.1)
BASOS PCT: 0 %
Eosinophils Absolute: 0.1 10*3/uL (ref 0.0–0.5)
Eosinophils Relative: 1 %
HCT: 38.2 % — ABNORMAL LOW (ref 39.0–52.0)
HEMOGLOBIN: 12.5 g/dL — AB (ref 13.0–17.0)
IMMATURE GRANULOCYTES: 0 %
Lymphocytes Relative: 33 %
Lymphs Abs: 2.1 10*3/uL (ref 0.7–4.0)
MCH: 30.5 pg (ref 26.0–34.0)
MCHC: 32.7 g/dL (ref 30.0–36.0)
MCV: 93.2 fL (ref 80.0–100.0)
MONO ABS: 0.6 10*3/uL (ref 0.1–1.0)
Monocytes Relative: 10 %
NEUTROS ABS: 3.5 10*3/uL (ref 1.7–7.7)
NEUTROS PCT: 56 %
PLATELETS: 212 10*3/uL (ref 150–400)
RBC: 4.1 MIL/uL — AB (ref 4.22–5.81)
RDW: 13.9 % (ref 11.5–15.5)
WBC: 6.4 10*3/uL (ref 4.0–10.5)
nRBC: 0 % (ref 0.0–0.2)

## 2017-12-08 LAB — CMP (CANCER CENTER ONLY)
ALK PHOS: 56 U/L (ref 38–126)
ALT: 16 U/L (ref 0–44)
AST: 19 U/L (ref 15–41)
Albumin: 3.7 g/dL (ref 3.5–5.0)
Anion gap: 7 (ref 5–15)
BUN: 18 mg/dL (ref 8–23)
CALCIUM: 9.3 mg/dL (ref 8.9–10.3)
CHLORIDE: 106 mmol/L (ref 98–111)
CO2: 26 mmol/L (ref 22–32)
CREATININE: 0.95 mg/dL (ref 0.61–1.24)
GFR, Estimated: >60 mL/min (ref >60)
GLUCOSE: 95 mg/dL (ref 70–99)
Potassium: 3.8 mmol/L (ref 3.5–5.1)
SODIUM: 139 mmol/L (ref 135–145)
Total Bilirubin: 0.4 mg/dL (ref 0.3–1.2)
Total Protein: 6.6 g/dL (ref 6.5–8.1)

## 2017-12-08 MED ORDER — SODIUM CHLORIDE 0.9% FLUSH
10.0000 mL | Freq: Once | INTRAVENOUS | Status: AC
  Administered 2017-12-08: 10 mL
  Filled 2017-12-08: qty 10

## 2017-12-08 MED ORDER — HEPARIN SOD (PORK) LOCK FLUSH 100 UNIT/ML IV SOLN
500.0000 [IU] | Freq: Once | INTRAVENOUS | Status: AC
  Administered 2017-12-08: 500 [IU]
  Filled 2017-12-08: qty 5

## 2017-12-09 ENCOUNTER — Inpatient Hospital Stay (HOSPITAL_BASED_OUTPATIENT_CLINIC_OR_DEPARTMENT_OTHER): Payer: Medicare Other | Admitting: Oncology

## 2017-12-09 ENCOUNTER — Inpatient Hospital Stay: Payer: Medicare Other

## 2017-12-09 ENCOUNTER — Telehealth: Payer: Self-pay | Admitting: Oncology

## 2017-12-09 VITALS — BP 164/90 | HR 75 | Temp 98.2°F | Resp 18 | Ht 76.0 in | Wt 195.9 lb

## 2017-12-09 DIAGNOSIS — Z95828 Presence of other vascular implants and grafts: Secondary | ICD-10-CM

## 2017-12-09 DIAGNOSIS — Z9221 Personal history of antineoplastic chemotherapy: Secondary | ICD-10-CM | POA: Diagnosis not present

## 2017-12-09 DIAGNOSIS — E348 Other specified endocrine disorders: Secondary | ICD-10-CM | POA: Diagnosis not present

## 2017-12-09 DIAGNOSIS — I10 Essential (primary) hypertension: Secondary | ICD-10-CM | POA: Diagnosis not present

## 2017-12-09 DIAGNOSIS — C61 Malignant neoplasm of prostate: Secondary | ICD-10-CM

## 2017-12-09 DIAGNOSIS — Z9079 Acquired absence of other genital organ(s): Secondary | ICD-10-CM

## 2017-12-09 DIAGNOSIS — Z79899 Other long term (current) drug therapy: Secondary | ICD-10-CM

## 2017-12-09 DIAGNOSIS — C7951 Secondary malignant neoplasm of bone: Secondary | ICD-10-CM

## 2017-12-09 LAB — PROSTATE-SPECIFIC AG, SERUM (LABCORP): PROSTATE SPECIFIC AG, SERUM: 0.2 ng/mL (ref 0.0–4.0)

## 2017-12-09 MED ORDER — DENOSUMAB 120 MG/1.7ML ~~LOC~~ SOLN
120.0000 mg | Freq: Once | SUBCUTANEOUS | Status: AC
  Administered 2017-12-09: 120 mg via SUBCUTANEOUS

## 2017-12-09 MED ORDER — DENOSUMAB 120 MG/1.7ML ~~LOC~~ SOLN
SUBCUTANEOUS | Status: AC
  Filled 2017-12-09: qty 1.7

## 2017-12-09 NOTE — Progress Notes (Signed)
Hematology and Oncology Follow Up Visit  Philip [name] [id number] 1945/07/22 72 y.o. 12/09/2017 12:48 PM Philip [name], MDJones, [name], MD   Principle Diagnosis: 72 year old man with advanced prostate cancer at the time of presentation in 2016.  He was noted to have bone disease and lymphadenopathy in addition to PSA of 2900.  Castration resistant disease at this time.   Prior Therapy: Status post orchiectomy done on November 2016.  Taxotere chemotherapy at 75 mg/m to start on 02/16/2015. He is completed 6 cycles of therapy in March 2017.  Current therapy: Zytiga 1000 mg daily with prednisone at 5 mg daily started on 09/13/2015.  Were taken prednisone at this time.  Interim History:  [name] returns today for an evaluation.  Since the last visit, he reports no major changes in his health.  He continues to tolerate Zytiga without any recent complaints.  He was able to travel to the beach without any difficulties.  He continues to exercise left regularly.  He reports no decline in his appetite or difficulties taking or obtaining this medication.  He does not report any headaches, blurry vision, syncope or seizures.  He denies any confusion or lethargy.  He does not report any fevers, chills, sweats.  He does not report any chest pain, palpitation, orthopnea. Does not report any cough, wheezing, or shortness of breath. He does not report any nausea, vomiting, abdominal pain.  He denies any changes in bowel habits.  He does not report any frequency, urgency or hesitancy. Does not report any hematuria or dysuria.   He does not report any bleeding or clotting tendency.  Denies any anxiety.   Remaining review of systems is negative.  Medications: I have reviewed the patient's current medications.   Current Outpatient Medications  Medication Sig Dispense Refill  . aspirin EC 81 MG tablet Take 81 mg by mouth daily.    Marland Kitchen atorvastatin (LIPITOR) 10 MG tablet TAKE 1 TABLET BY MOUTH EVERY DAY 90 tablet  1  . BIOTIN 5000 PO Take 1 tablet by mouth daily.    . Calcium Carbonate-Vitamin D3 (CALCIUM 600+D3) 600-400 MG-UNIT TABS Take 1 tablet by mouth daily.    . Denosumab (XGEVA [place]) Inject into the skin.    . fexofenadine (ALLEGRA) 180 MG tablet Take 180 mg by mouth daily.    . fluticasone (FLONASE) 50 MCG/ACT nasal spray Place 2 sprays into both nostrils daily. 16 g 2  . hydrOXYzine (ATARAX/VISTARIL) 10 MG tablet Take 10 mg by mouth every 8 (eight) hours as needed.  10  . lidocaine-prilocaine (EMLA) cream Apply 1 application topically as needed. Apply one teaspoon topically to port 1-2 hours prior to access. 30 g 2  . Multiple Vitamin (MULTIVITAMIN) tablet Take 1 tablet by mouth daily.    Marland Kitchen tretinoin (RETIN-A) 0.1 % cream Apply 1 application topically at bedtime.    . triamcinolone cream (KENALOG) 0.1 % Apply 1 application topically 2 (two) times daily. 80 g 0  . ZYTIGA 250 MG tablet TAKE 4 TABLETS BY MOUTH ONCE DAILY AS DIRECTED.  TAKE 1 HOUR BEFORE OR 2 HOURS AFTER A MEAL 120 tablet 11   No current facility-administered medications for this visit.      Allergies:  Allergies  Allergen Reactions  . Prednisone Anxiety    Past Medical History, Surgical history, Social history, and Family History remain unchanged on review.   Physical Exam:  Blood pressure (!) 164/90, pulse 75, temperature 98.2 F (36.8 C), temperature source Oral, resp.  rate 18, height 6\' 4"  (1.93 m), weight 195 lb 14.4 oz (88.9 kg), SpO2 100 %.     ECOG: 0   General appearance: Alert, awake without any distress. Head: Atraumatic without abnormalities Oropharynx: Without any thrush or ulcers. Eyes: No scleral icterus. Lymph nodes: No lymphadenopathy noted in the cervical, supraclavicular, or axillary nodes Heart:regular rate and rhythm, without any murmurs or gallops.   Lung: Clear to auscultation without any rhonchi, wheezes or dullness to percussion. Abdomin: Soft, nontender without any shifting dullness or  ascites. Musculoskeletal: No clubbing or cyanosis. Neurological: No motor or sensory deficits. Skin: No rashes or lesions.        Lab Results: Lab Results  Component Value Date   WBC 6.4 12/08/2017   HGB 12.5 (L) 12/08/2017   HCT 38.2 (L) 12/08/2017   MCV 93.2 12/08/2017   PLT 212 12/08/2017     Chemistry      Component Value Date/Time   NA 139 12/08/2017 1338   NA 139 02/10/2017 1323   K 3.8 12/08/2017 1338   K 3.4 (L) 02/10/2017 1323   CL 106 12/08/2017 1338   CO2 26 12/08/2017 1338   CO2 23 02/10/2017 1323   BUN 18 12/08/2017 1338   BUN 13.6 02/10/2017 1323   CREATININE 0.95 12/08/2017 1338   CREATININE 0.9 02/10/2017 1323      Component Value Date/Time   CALCIUM 9.3 12/08/2017 1338   CALCIUM 9.2 02/10/2017 1323   ALKPHOS 56 12/08/2017 1338   ALKPHOS 50 02/10/2017 1323   AST 19 12/08/2017 1338   AST 20 02/10/2017 1323   ALT 16 12/08/2017 1338   ALT 15 02/10/2017 1323   BILITOT 0.4 12/08/2017 1338   BILITOT 0.52 02/10/2017 1323       Results for RISHAWN, WALCK (MRN [id number]) as of 12/09/2017 12:40  Ref. Range 09/29/2017 13:30 11/03/2017 13:51 12/08/2017 13:38  Prostate Specific Ag, Serum Latest Ref Range: 0.0 - 4.0 ng/mL 0.3 0.2 0.2       Impression and Plan:  72 year old man with:  1.  Castration-resistant prostate cancer with disease to the bone adenopathy documented in 2016.  He is currently on Zytiga and has tolerated therapy without any recent complications.  His PSA continues to be low without any symptomatic progression of his cancer.  Risks and benefits of continuing this therapy long-term was reviewed again and is agreeable to continue.  Long-term complication associated with this therapy including renal insufficiency, hypertension and edema were also reviewed.  2. IV access: Port-A-Cath will be flushed with every visit.  3. Hypertension: His blood pressure monitoring has been within normal range outside of his visits.  I continue to ask him to  check his blood pressure periodically.  4.  Androgen depravation: Testosterone level remain castrate after orchiectomy.  No additional androgen deprivation is needed.  5.  Skeletal related events prevention: He continues to tolerate Xgeva very well without any complications.  Long-term complication associated with this therapy was reiterated.  These would include hypocalcemia and osteonecrosis of the jaw.  6.  Prognosis: Although therapy is palliative, aggressive therapy is warranted given his excellent disease response and performance status.  7. Follow-up: He will also be in 4 to 6 weeks to follow his progress.  15 minutes was spent with the patient face-to-face today.  More than 50% of time was dedicated to discussing disease status, treatment options and coordinating plan of care.   Zola Button, MD 10/9/201912:48 PM

## 2017-12-09 NOTE — Telephone Encounter (Signed)
Appts scheduled avs/calendar printed per 10/9

## 2018-01-19 ENCOUNTER — Inpatient Hospital Stay: Payer: Medicare Other | Attending: Oncology

## 2018-01-19 ENCOUNTER — Inpatient Hospital Stay: Payer: Medicare Other

## 2018-01-19 DIAGNOSIS — C61 Malignant neoplasm of prostate: Secondary | ICD-10-CM

## 2018-01-19 DIAGNOSIS — I10 Essential (primary) hypertension: Secondary | ICD-10-CM | POA: Insufficient documentation

## 2018-01-19 DIAGNOSIS — Z9221 Personal history of antineoplastic chemotherapy: Secondary | ICD-10-CM | POA: Insufficient documentation

## 2018-01-19 DIAGNOSIS — Z7982 Long term (current) use of aspirin: Secondary | ICD-10-CM | POA: Diagnosis not present

## 2018-01-19 DIAGNOSIS — Z452 Encounter for adjustment and management of vascular access device: Secondary | ICD-10-CM | POA: Insufficient documentation

## 2018-01-19 DIAGNOSIS — Z79899 Other long term (current) drug therapy: Secondary | ICD-10-CM | POA: Diagnosis not present

## 2018-01-19 DIAGNOSIS — Z79818 Long term (current) use of other agents affecting estrogen receptors and estrogen levels: Secondary | ICD-10-CM | POA: Diagnosis not present

## 2018-01-19 DIAGNOSIS — C7951 Secondary malignant neoplasm of bone: Secondary | ICD-10-CM | POA: Diagnosis not present

## 2018-01-19 DIAGNOSIS — Z95828 Presence of other vascular implants and grafts: Secondary | ICD-10-CM

## 2018-01-19 LAB — CBC WITH DIFFERENTIAL (CANCER CENTER ONLY)
Abs Immature Granulocytes: 0.01 10*3/uL (ref 0.00–0.07)
BASOS ABS: 0 10*3/uL (ref 0.0–0.1)
Basophils Relative: 0 %
EOS ABS: 0.1 10*3/uL (ref 0.0–0.5)
Eosinophils Relative: 2 %
HEMATOCRIT: 38.5 % — AB (ref 39.0–52.0)
HEMOGLOBIN: 12.5 g/dL — AB (ref 13.0–17.0)
Immature Granulocytes: 0 %
LYMPHS PCT: 33 %
Lymphs Abs: 1.9 10*3/uL (ref 0.7–4.0)
MCH: 30.2 pg (ref 26.0–34.0)
MCHC: 32.5 g/dL (ref 30.0–36.0)
MCV: 93 fL (ref 80.0–100.0)
Monocytes Absolute: 0.6 10*3/uL (ref 0.1–1.0)
Monocytes Relative: 10 %
NEUTROS PCT: 55 %
NRBC: 0 % (ref 0.0–0.2)
Neutro Abs: 3.1 10*3/uL (ref 1.7–7.7)
Platelet Count: 205 10*3/uL (ref 150–400)
RBC: 4.14 MIL/uL — AB (ref 4.22–5.81)
RDW: 13.7 % (ref 11.5–15.5)
WBC: 5.8 10*3/uL (ref 4.0–10.5)

## 2018-01-19 LAB — CMP (CANCER CENTER ONLY)
ALBUMIN: 3.6 g/dL (ref 3.5–5.0)
ALK PHOS: 55 U/L (ref 38–126)
ALT: 16 U/L (ref 0–44)
ANION GAP: 8 (ref 5–15)
AST: 21 U/L (ref 15–41)
BUN: 15 mg/dL (ref 8–23)
CO2: 24 mmol/L (ref 22–32)
Calcium: 9 mg/dL (ref 8.9–10.3)
Chloride: 108 mmol/L (ref 98–111)
Creatinine: 1.01 mg/dL (ref 0.61–1.24)
GFR, Est AFR Am: >60 mL/min (ref >60)
GLUCOSE: 113 mg/dL — AB (ref 70–99)
Potassium: 3.8 mmol/L (ref 3.5–5.1)
Sodium: 140 mmol/L (ref 135–145)
Total Bilirubin: 0.4 mg/dL (ref 0.3–1.2)
Total Protein: 6.5 g/dL (ref 6.5–8.1)

## 2018-01-19 MED ORDER — SODIUM CHLORIDE 0.9% FLUSH
10.0000 mL | Freq: Once | INTRAVENOUS | Status: AC
  Administered 2018-01-19: 10 mL
  Filled 2018-01-19: qty 10

## 2018-01-19 MED ORDER — HEPARIN SOD (PORK) LOCK FLUSH 100 UNIT/ML IV SOLN
500.0000 [IU] | Freq: Once | INTRAVENOUS | Status: AC
  Administered 2018-01-19: 500 [IU]
  Filled 2018-01-19: qty 5

## 2018-01-20 ENCOUNTER — Other Ambulatory Visit: Payer: Self-pay

## 2018-01-20 LAB — PROSTATE-SPECIFIC AG, SERUM (LABCORP): PROSTATE SPECIFIC AG, SERUM: 0.1 ng/mL (ref 0.0–4.0)

## 2018-01-21 ENCOUNTER — Inpatient Hospital Stay: Payer: Medicare Other

## 2018-01-21 ENCOUNTER — Telehealth: Payer: Self-pay | Admitting: Oncology

## 2018-01-21 ENCOUNTER — Inpatient Hospital Stay (HOSPITAL_BASED_OUTPATIENT_CLINIC_OR_DEPARTMENT_OTHER): Payer: Medicare Other | Admitting: Oncology

## 2018-01-21 VITALS — BP 168/87 | HR 80 | Temp 97.9°F | Resp 18 | Ht 76.0 in | Wt 199.3 lb

## 2018-01-21 DIAGNOSIS — Z79899 Other long term (current) drug therapy: Secondary | ICD-10-CM | POA: Diagnosis not present

## 2018-01-21 DIAGNOSIS — Z452 Encounter for adjustment and management of vascular access device: Secondary | ICD-10-CM | POA: Diagnosis not present

## 2018-01-21 DIAGNOSIS — Z79818 Long term (current) use of other agents affecting estrogen receptors and estrogen levels: Secondary | ICD-10-CM | POA: Diagnosis not present

## 2018-01-21 DIAGNOSIS — Z7982 Long term (current) use of aspirin: Secondary | ICD-10-CM | POA: Diagnosis not present

## 2018-01-21 DIAGNOSIS — C7951 Secondary malignant neoplasm of bone: Secondary | ICD-10-CM

## 2018-01-21 DIAGNOSIS — Z9221 Personal history of antineoplastic chemotherapy: Secondary | ICD-10-CM

## 2018-01-21 DIAGNOSIS — I10 Essential (primary) hypertension: Secondary | ICD-10-CM | POA: Diagnosis not present

## 2018-01-21 DIAGNOSIS — C61 Malignant neoplasm of prostate: Secondary | ICD-10-CM

## 2018-01-21 DIAGNOSIS — Z95828 Presence of other vascular implants and grafts: Secondary | ICD-10-CM

## 2018-01-21 MED ORDER — DENOSUMAB 120 MG/1.7ML ~~LOC~~ SOLN
SUBCUTANEOUS | Status: AC
  Filled 2018-01-21: qty 1.7

## 2018-01-21 NOTE — Telephone Encounter (Signed)
Gave pt avs and calendar  °

## 2018-01-21 NOTE — Progress Notes (Addendum)
Hematology and Oncology Follow Up Visit  Philip [name] [id number] 1945-09-03 72 y.o. 01/21/2018 1:07 PM [name], MDJones, [name], MD   Principle Diagnosis: 72 year old man with castration-sensitive prostate cancer with bone disease and lymphadenopathy diagnosed in 2016.  He presented initially with advanced disease and PSA of 2900.    Prior Therapy: Status post orchiectomy done on November 2016.  Taxotere chemotherapy at 75 mg/m to start on 02/16/2015. He is completed 6 cycles of therapy in March 2017.  Current therapy: Zytiga 1000 mg daily with prednisone at 5 mg daily started on 09/13/2015.  Prednisone was discontinued because of poor tolerance.  Interim History:  Philip [name] is here for a follow-up visit.  Since the last visit, he reports no major changes in his health.  He continues to be active and exercise regularly and eats well.  He denies any worsening bone pain or discomfort.  He continues to tolerate Zytiga without any new complications.  He denies any lower extremity edema, excessive fatigue or GI issues.  He denies any urination difficulties or recent hospitalizations.  His performance status quality of life remain excellent.  He is planning to have dental surgery in January of next year.  He does not report any headaches, blurry vision, syncope or seizures.  He denies any alteration in mental status or lethargy.  He does not report any fevers, chills, sweats.  He does not report any chest pain, palpitation, orthopnea. Does not report any cough, wheezing, or shortness of breath. He does not report any nausea, vomiting, or early satiety.  He denies any constipation or diarrhea.  He does not report any frequency, urgency or hesitancy.  Does not report any lymphadenopathy or petechiae.  Denies any ecchymosis or easy bruising.  He denies any mood changes.   Remaining review of systems is negative.  Medications: I have reviewed the patient's current medications.   Current  Outpatient Medications  Medication Sig Dispense Refill  . aspirin EC 81 MG tablet Take 81 mg by mouth daily.    Marland Kitchen atorvastatin (LIPITOR) 10 MG tablet TAKE 1 TABLET BY MOUTH EVERY DAY 90 tablet 1  . BIOTIN 5000 PO Take 1 tablet by mouth daily.    . Calcium Carbonate-Vitamin D3 (CALCIUM 600+D3) 600-400 MG-UNIT TABS Take 1 tablet by mouth daily.    . Denosumab (XGEVA [place]) Inject into the skin.    . fexofenadine (ALLEGRA) 180 MG tablet Take 180 mg by mouth daily.    . fluticasone (FLONASE) 50 MCG/ACT nasal spray Place 2 sprays into both nostrils daily. 16 g 2  . hydrOXYzine (ATARAX/VISTARIL) 10 MG tablet Take 10 mg by mouth every 8 (eight) hours as needed.  10  . lidocaine-prilocaine (EMLA) cream Apply 1 application topically as needed. Apply one teaspoon topically to port 1-2 hours prior to access. 30 g 2  . Multiple Vitamin (MULTIVITAMIN) tablet Take 1 tablet by mouth daily.    Marland Kitchen tretinoin (RETIN-A) 0.1 % cream Apply 1 application topically at bedtime.    . triamcinolone cream (KENALOG) 0.1 % Apply 1 application topically 2 (two) times daily. 80 g 0  . ZYTIGA 250 MG tablet TAKE 4 TABLETS BY MOUTH ONCE DAILY AS DIRECTED.  TAKE 1 HOUR BEFORE OR 2 HOURS AFTER A MEAL 120 tablet 11   No current facility-administered medications for this visit.      Allergies:  Allergies  Allergen Reactions  . Prednisone Anxiety    Past Medical History, Surgical history, Social history, and Family History  remain unchanged on review.   Physical Exam:  Blood pressure (!) 168/87, pulse 80, temperature 97.9 F (36.6 C), temperature source Oral, resp. rate 18, height 6\' 4"  (1.93 m), weight 199 lb 4.8 oz (90.4 kg), SpO2 99 %.   ECOG: 0   General appearance: Comfortable appearing without any discomfort Head: Normocephalic without any trauma Oropharynx: Mucous membranes are moist and pink without any thrush or ulcers. Eyes: Pupils are equal and round reactive to light. Lymph nodes: No cervical,  supraclavicular, inguinal or axillary lymphadenopathy.   Heart:regular rate and rhythm.  S1 and S2 without leg edema. Lung: Clear without any rhonchi or wheezes.  No dullness to percussion. Abdomin: Soft, nontender, nondistended with good bowel sounds.  No hepatosplenomegaly. Musculoskeletal: No joint deformity or effusion.  Full range of motion noted. Neurological: No deficits noted on motor, sensory and deep tendon reflex exam. Skin: No petechial rash or dryness.  Appeared moist.          Lab Results: Lab Results  Component Value Date   WBC 5.8 01/19/2018   HGB 12.5 (L) 01/19/2018   HCT 38.5 (L) 01/19/2018   MCV 93.0 01/19/2018   PLT 205 01/19/2018     Chemistry      Component Value Date/Time   NA 140 01/19/2018 1328   NA 139 02/10/2017 1323   K 3.8 01/19/2018 1328   K 3.4 (L) 02/10/2017 1323   CL 108 01/19/2018 1328   CO2 24 01/19/2018 1328   CO2 23 02/10/2017 1323   BUN 15 01/19/2018 1328   BUN 13.6 02/10/2017 1323   CREATININE 1.01 01/19/2018 1328   CREATININE 0.9 02/10/2017 1323      Component Value Date/Time   CALCIUM 9.0 01/19/2018 1328   CALCIUM 9.2 02/10/2017 1323   ALKPHOS 55 01/19/2018 1328   ALKPHOS 50 02/10/2017 1323   AST 21 01/19/2018 1328   AST 20 02/10/2017 1323   ALT 16 01/19/2018 1328   ALT 15 02/10/2017 1323   BILITOT 0.4 01/19/2018 1328   BILITOT 0.52 02/10/2017 1323        Results for Philip [name], Philip [name] (MRN [id number]) as of 01/21/2018 13:09  Ref. Range 12/08/2017 13:38 01/19/2018 13:28  Prostate Specific Ag, Serum Latest Ref Range: 0.0 - 4.0 ng/mL 0.2 0.1       Impression and Plan:  72 year old man with:  1.  Advanced prostate cancer that is currently castration-resistant with documented disease to the bone and adenopathy since 2016.  He remains on Zytiga with excellent tolerance and reasonable PSA response.  His PSA continues to be low and declining without any major complications related to this therapy.  Risks and benefits of  continuing this medication long-term was reviewed today.  Long-term complications including hypertension and adrenal insufficiency among others were reviewed and is agreeable to continue.  We will restage him with a CT scan and a bone scan to update his disease status.  Alternative options depending on his progression would include retreatment with systemic chemotherapy or different hormonal agents in the future.  Next generation sequencing will also be used for molecular targeted therapy as well.  2. IV access: Port-A-Cath remains in place and will be flushed every 6 to 8 weeks.  3. Hypertension: Blood pressure is mildly elevated but has been within normal range previously.  We will continue to monitor.  4.  Androgen depravation: Testosterone will be repeated periodically to ensure castration level.  Testosterone was very low in April 2019.  5.  Skeletal related events prevention:  He is currently on Xgeva and has tolerated very well. Delton See will be on hold till his dental surgery is completed.  Risk of osteonecrosis of the jaw was reiterated today given his upcoming dental surgery.  6.  Prognosis: Treatment remains palliative at this time although aggressive therapy is warranted given his excellent disease status and performance status..  7. Follow-up: He will also be in 4 weeks to follow his progress.  25 minutes was spent with the patient face-to-face today.  More than 50% of time was dedicated to discussing disease status, treatment options and answering question regarding future plan of care.   Zola Button, MD 11/21/20191:07 PM

## 2018-02-19 ENCOUNTER — Encounter (HOSPITAL_COMMUNITY)
Admission: RE | Admit: 2018-02-19 | Discharge: 2018-02-19 | Disposition: A | Discharge status: Home / Self Care | Payer: Medicare Other | Source: Ambulatory Visit | Attending: Oncology | Admitting: Oncology

## 2018-02-19 ENCOUNTER — Inpatient Hospital Stay: Payer: Medicare Other

## 2018-02-19 ENCOUNTER — Inpatient Hospital Stay: Payer: Medicare Other | Attending: Oncology

## 2018-02-19 DIAGNOSIS — Z5111 Encounter for antineoplastic chemotherapy: Secondary | ICD-10-CM | POA: Diagnosis not present

## 2018-02-19 DIAGNOSIS — Z9079 Acquired absence of other genital organ(s): Secondary | ICD-10-CM | POA: Insufficient documentation

## 2018-02-19 DIAGNOSIS — Z7982 Long term (current) use of aspirin: Secondary | ICD-10-CM | POA: Insufficient documentation

## 2018-02-19 DIAGNOSIS — I10 Essential (primary) hypertension: Secondary | ICD-10-CM | POA: Insufficient documentation

## 2018-02-19 DIAGNOSIS — Z79899 Other long term (current) drug therapy: Secondary | ICD-10-CM | POA: Insufficient documentation

## 2018-02-19 DIAGNOSIS — R590 Localized enlarged lymph nodes: Secondary | ICD-10-CM | POA: Diagnosis not present

## 2018-02-19 DIAGNOSIS — C61 Malignant neoplasm of prostate: Secondary | ICD-10-CM | POA: Insufficient documentation

## 2018-02-19 DIAGNOSIS — Z9221 Personal history of antineoplastic chemotherapy: Secondary | ICD-10-CM | POA: Insufficient documentation

## 2018-02-19 DIAGNOSIS — Z95828 Presence of other vascular implants and grafts: Secondary | ICD-10-CM

## 2018-02-19 DIAGNOSIS — C7951 Secondary malignant neoplasm of bone: Secondary | ICD-10-CM | POA: Insufficient documentation

## 2018-02-19 LAB — CBC WITH DIFFERENTIAL (CANCER CENTER ONLY)
Abs Immature Granulocytes: 0.02 10*3/uL (ref 0.00–0.07)
Basophils Absolute: 0 10*3/uL (ref 0.0–0.1)
Basophils Relative: 0 %
EOS ABS: 0.1 10*3/uL (ref 0.0–0.5)
EOS PCT: 1 %
HCT: 41.6 % (ref 39.0–52.0)
HEMOGLOBIN: 13.6 g/dL (ref 13.0–17.0)
Immature Granulocytes: 0 %
LYMPHS ABS: 1.9 10*3/uL (ref 0.7–4.0)
LYMPHS PCT: 30 %
MCH: 30.1 pg (ref 26.0–34.0)
MCHC: 32.7 g/dL (ref 30.0–36.0)
MCV: 92 fL (ref 80.0–100.0)
MONO ABS: 0.6 10*3/uL (ref 0.1–1.0)
MONOS PCT: 10 %
Neutro Abs: 3.6 10*3/uL (ref 1.7–7.7)
Neutrophils Relative %: 59 %
Platelet Count: 210 10*3/uL (ref 150–400)
RBC: 4.52 MIL/uL (ref 4.22–5.81)
RDW: 13.4 % (ref 11.5–15.5)
WBC Count: 6.3 10*3/uL (ref 4.0–10.5)
nRBC: 0 % (ref 0.0–0.2)

## 2018-02-19 LAB — CMP (CANCER CENTER ONLY)
ALBUMIN: 3.9 g/dL (ref 3.5–5.0)
ALK PHOS: 57 U/L (ref 38–126)
ALT: 18 U/L (ref 0–44)
AST: 21 U/L (ref 15–41)
Anion gap: 10 (ref 5–15)
BUN: 14 mg/dL (ref 8–23)
CALCIUM: 9.2 mg/dL (ref 8.9–10.3)
CHLORIDE: 105 mmol/L (ref 98–111)
CO2: 24 mmol/L (ref 22–32)
CREATININE: 0.87 mg/dL (ref 0.61–1.24)
GFR, Est AFR Am: >60 mL/min (ref >60)
GFR, Estimated: >60 mL/min (ref >60)
GLUCOSE: 96 mg/dL (ref 70–99)
Potassium: 4.1 mmol/L (ref 3.5–5.1)
SODIUM: 139 mmol/L (ref 135–145)
Total Bilirubin: 0.5 mg/dL (ref 0.3–1.2)
Total Protein: 7 g/dL (ref 6.5–8.1)

## 2018-02-19 MED ORDER — TECHNETIUM TC 99M MEDRONATE IV KIT
20.0000 | PACK | Freq: Once | INTRAVENOUS | Status: AC | PRN
  Administered 2018-02-19: 19.9 via INTRAVENOUS

## 2018-02-19 MED ORDER — HEPARIN SOD (PORK) LOCK FLUSH 100 UNIT/ML IV SOLN: 500.0000 [IU] | Freq: Once | INTRAVENOUS | Status: DC

## 2018-02-19 MED ORDER — SODIUM CHLORIDE 0.9% FLUSH
10.0000 mL | Freq: Once | INTRAVENOUS | Status: AC
  Administered 2018-02-19: 10 mL
  Filled 2018-02-19: qty 10

## 2018-02-19 MED ORDER — SODIUM CHLORIDE (PF) 0.9 % IJ SOLN
INTRAMUSCULAR | Status: AC
  Filled 2018-02-19: qty 50

## 2018-02-19 MED ORDER — HEPARIN SOD (PORK) LOCK FLUSH 100 UNIT/ML IV SOLN
INTRAVENOUS | Status: AC
  Filled 2018-02-19: qty 5

## 2018-02-19 MED ORDER — IOHEXOL 300 MG/ML  SOLN
100.0000 mL | Freq: Once | INTRAMUSCULAR | Status: AC | PRN
  Administered 2018-02-19: 100 mL via INTRAVENOUS

## 2018-02-20 LAB — PROSTATE-SPECIFIC AG, SERUM (LABCORP): Prostate Specific Ag, Serum: 0.2 ng/mL (ref 0.0–4.0)

## 2018-02-23 ENCOUNTER — Telehealth: Payer: Self-pay | Admitting: Oncology

## 2018-02-23 ENCOUNTER — Inpatient Hospital Stay (HOSPITAL_BASED_OUTPATIENT_CLINIC_OR_DEPARTMENT_OTHER): Payer: Medicare Other | Admitting: Oncology

## 2018-02-23 VITALS — BP 148/98 | HR 91 | Temp 97.7°F | Resp 18 | Ht 76.0 in | Wt 201.5 lb

## 2018-02-23 DIAGNOSIS — I10 Essential (primary) hypertension: Secondary | ICD-10-CM | POA: Diagnosis not present

## 2018-02-23 DIAGNOSIS — R590 Localized enlarged lymph nodes: Secondary | ICD-10-CM | POA: Diagnosis not present

## 2018-02-23 DIAGNOSIS — Z7982 Long term (current) use of aspirin: Secondary | ICD-10-CM | POA: Diagnosis not present

## 2018-02-23 DIAGNOSIS — C61 Malignant neoplasm of prostate: Secondary | ICD-10-CM

## 2018-02-23 DIAGNOSIS — Z79899 Other long term (current) drug therapy: Secondary | ICD-10-CM | POA: Diagnosis not present

## 2018-02-23 DIAGNOSIS — C7951 Secondary malignant neoplasm of bone: Secondary | ICD-10-CM

## 2018-02-23 DIAGNOSIS — Z95828 Presence of other vascular implants and grafts: Secondary | ICD-10-CM

## 2018-02-23 DIAGNOSIS — Z9221 Personal history of antineoplastic chemotherapy: Secondary | ICD-10-CM

## 2018-02-23 DIAGNOSIS — Z9079 Acquired absence of other genital organ(s): Secondary | ICD-10-CM | POA: Diagnosis not present

## 2018-02-23 NOTE — Telephone Encounter (Signed)
Printed calendar and avs. °

## 2018-02-23 NOTE — Progress Notes (Signed)
Hematology and Oncology Follow Up Visit  Philip [name] [id number] 08-22-1945 72 y.o. 02/23/2018 9:36 AM [name], MDJones, [name], MD   Principle Diagnosis: 72 year old man with castration-resistant prostate cancer diagnosed in 2016 with a PSA of 2900 and bone disease.    Prior Therapy: Status post orchiectomy done on November 2016.  Taxotere chemotherapy at 75 mg/m to start on 02/16/2015. He is completed 6 cycles of therapy in March 2017.  Current therapy: Zytiga 1000 mg daily with prednisone at 5 mg daily started on 09/13/2015.  He is no longer taking prednisone.  Interim History:  Mr. [name] returns today for a repeat evaluation.  Since her last visit, he reports no recent complaints or changes and his health.  He continues to tolerate Zytiga without any issues.  He denies any excessive fatigue, tiredness or edema.  His performance status and quality of life remains excellent.  He does report some occasional anxiety associated with stress but otherwise he is coping reasonably well.  He denies any worsening bone pain or pathological fractures.  He does not report any headaches, blurry vision, syncope or seizures.  He denies any confusion or alteration of mentation.  He does not report any fevers, chills, sweats.  He does not report any chest pain, palpitation, orthopnea. Does not report any cough, wheezing, or dyspnea on exertion. He does not report any nausea, vomiting, or domino pain.  He denies any changes in bowel habits.  He does not report any frequency, urgency or hesitancy.  Does not report any bleeding or clotting tendency.  Denies any skin rashes or lesions.  He denies any depression.   Remaining review of systems is negative.  Medications: I have reviewed the patient's current medications.   Current Outpatient Medications  Medication Sig Dispense Refill  . aspirin EC 81 MG tablet Take 81 mg by mouth daily.    Marland Kitchen atorvastatin (LIPITOR) 10 MG tablet TAKE 1 TABLET BY MOUTH EVERY  DAY 90 tablet 1  . BIOTIN 5000 PO Take 1 tablet by mouth daily.    . Calcium Carbonate-Vitamin D3 (CALCIUM 600+D3) 600-400 MG-UNIT TABS Take 1 tablet by mouth daily.    . Denosumab (XGEVA [place]) Inject into the skin.    . fexofenadine (ALLEGRA) 180 MG tablet Take 180 mg by mouth daily.    . fluticasone (FLONASE) 50 MCG/ACT nasal spray Place 2 sprays into both nostrils daily. 16 g 2  . hydrOXYzine (ATARAX/VISTARIL) 10 MG tablet Take 10 mg by mouth every 8 (eight) hours as needed.  10  . lidocaine-prilocaine (EMLA) cream Apply 1 application topically as needed. Apply one teaspoon topically to port 1-2 hours prior to access. 30 g 2  . Multiple Vitamin (MULTIVITAMIN) tablet Take 1 tablet by mouth daily.    Marland Kitchen tretinoin (RETIN-A) 0.1 % cream Apply 1 application topically at bedtime.    . triamcinolone cream (KENALOG) 0.1 % Apply 1 application topically 2 (two) times daily. 80 g 0  . ZYTIGA 250 MG tablet TAKE 4 TABLETS BY MOUTH ONCE DAILY AS DIRECTED.  TAKE 1 HOUR BEFORE OR 2 HOURS AFTER A MEAL 120 tablet 11   No current facility-administered medications for this visit.      Allergies:  Allergies  Allergen Reactions  . Prednisone Anxiety    Past Medical History, Surgical history, Social history, and Family History remain unchanged on review.   Physical Exam:  Blood pressure (!) 148/98, pulse 91, temperature 97.7 F (36.5 C), temperature source Oral, resp. rate 18,  height 6\' 4"  (1.93 m), weight 201 lb 8 oz (91.4 kg), SpO2 100 %.    ECOG: 0   General appearance: Alert, awake without any distress. Head: Atraumatic without abnormalities Oropharynx: Without any thrush or ulcers. Eyes: No scleral icterus. Lymph nodes: No lymphadenopathy noted in the cervical, supraclavicular, or axillary nodes Heart:regular rate and rhythm, without any murmurs or gallops.   Lung: Clear to auscultation without any rhonchi, wheezes or dullness to percussion. Abdomin: Soft, nontender without any shifting  dullness or ascites. Musculoskeletal: No clubbing or cyanosis. Neurological: No motor or sensory deficits. Skin: No rashes or lesions.           Lab Results: Lab Results  Component Value Date   WBC 6.3 02/19/2018   HGB 13.6 02/19/2018   HCT 41.6 02/19/2018   MCV 92.0 02/19/2018   PLT 210 02/19/2018     Chemistry      Component Value Date/Time   NA 139 02/19/2018 0945   NA 139 02/10/2017 1323   K 4.1 02/19/2018 0945   K 3.4 (L) 02/10/2017 1323   CL 105 02/19/2018 0945   CO2 24 02/19/2018 0945   CO2 23 02/10/2017 1323   BUN 14 02/19/2018 0945   BUN 13.6 02/10/2017 1323   CREATININE 0.87 02/19/2018 0945   CREATININE 0.9 02/10/2017 1323      Component Value Date/Time   CALCIUM 9.2 02/19/2018 0945   CALCIUM 9.2 02/10/2017 1323   ALKPHOS 57 02/19/2018 0945   ALKPHOS 50 02/10/2017 1323   AST 21 02/19/2018 0945   AST 20 02/10/2017 1323   ALT 18 02/19/2018 0945   ALT 15 02/10/2017 1323   BILITOT 0.5 02/19/2018 0945   BILITOT 0.52 02/10/2017 1323        Results for Philip, [name] (MRN [id number]) as of 02/23/2018 09:40  Ref. Range 12/08/2017 13:38 01/19/2018 13:28 02/19/2018 09:45  Prostate Specific Ag, Serum Latest Ref Range: 0.0 - 4.0 ng/mL 0.2 0.1 0.2      EXAM: CT ABDOMEN AND PELVIS WITH CONTRAST  TECHNIQUE: Multidetector CT imaging of the abdomen and pelvis was performed using the standard protocol following bolus administration of intravenous contrast.  CONTRAST:  136mL OMNIPAQUE IOHEXOL 300 MG/ML  SOLN  COMPARISON:  Abdominopelvic CT 01/13/2017. Bone scan 06/19/2017 and today.  FINDINGS: Lower chest: Clear lung bases. No significant pleural or pericardial effusion.  Hepatobiliary: There are several stable low-density hepatic lesions consistent with cysts. No new or enlarging lesions. No evidence of gallstones, gallbladder wall thickening or biliary dilatation.  Pancreas: Unremarkable. No pancreatic ductal dilatation or surrounding  inflammatory changes.  Spleen: Normal in size without focal abnormality.  Adrenals/Urinary Tract: Both adrenal glands appear normal. Stable small bilateral renal cysts. No evidence of urinary tract calculus, renal mass or hydronephrosis. Stable mild bladder wall thickening without apparent focal abnormality.  Stomach/Bowel: No evidence of bowel wall thickening, distention or surrounding inflammatory change. The appendix appears normal. Sigmoid colon redundancy again noted.  Vascular/Lymphatic: There are no residual enlarged abdominal or pelvic lymph nodes. Pelvic sidewall nodes noted previously are stable to improved, measuring 7 mm on the right (image 78/2) and 7 mm on the left (image 77/2, previously 11 mm). Mild aortic and branch vessel atherosclerosis. No acute vascular findings.  Reproductive: Stable appearance of the prostate gland and seminal vesicles.  Other: No evidence of abdominal wall mass or hernia. No ascites.  Musculoskeletal: Widespread blastic osseous metastatic disease throughout the visualized ribs, sternum, spine, pelvis and proximal femurs is grossly stable. No lytic  lesion or pathologic fracture identified. There is lower lumbar spondylosis with central stenosis at L3-4 and L4-5.  IMPRESSION: 1. Further improvement in pelvic lymphadenopathy. No residual enlarged abdominopelvic lymph nodes identified. 2. Stable widespread blastic osseous metastatic disease, likely treated. 3. Stable cysts within the liver and kidneys. 4. No acute findings. IMPRESSION: No progressive disease. There is only mild residual heterogeneous activity in the spine, pelvis and right sternoclavicular joint, significantly improved from 03/04/2016, and likely all treated disease.  Impression and Plan:  72 year old man with:  1.  Castration-resistant prostate cancer with disease to the bone and lymphadenopathy on presentation in 2016.    He is currently on Zytiga  without prednisone with excellent PSA response.  CT scan and bone scan obtained on February 19, 2018 were personally reviewed and discussed with the patient.  His PSA continues to show a excellent response currently at 0.2 without any disease relapse on imaging studies.  Treatment options moving forward were reviewed today with the patient and given his excellent response and his PSA is under control plan is to continue with the Zytiga at this time.  Alternative therapy may be needed in the future if he develops progression of disease.    2. IV access: Port-A-Cath will be flushed periodically before each visit.  3. Hypertension: Mildly elevated today but has been close to normal range between visits.  4.  Androgen depravation: He is status post bilateral orchiectomy.  Testosterone remains castrate level.  5.  Skeletal related events prevention: He is currently on Xgeva which will be held due to dental work.  We will discuss restarting this therapy in the future once his fully healed.  His bone disease appears to be well treated and is reasonable to defer Xgeva for the time being.  6.  Prognosis: Therapy remains palliative and aggressive therapy is warranted given his excellent performance status.  7. Follow-up: In 4 to 6 weeks for repeat evaluation.  25 minutes was spent with the patient face-to-face today.  More than 50% of time was dedicated to reviewing his disease status, treatment options, reviewing imaging studies and answering question regarding future plan of care.   Zola Button, MD 12/24/20199:36 AM

## 2018-03-17 ENCOUNTER — Telehealth: Payer: Self-pay | Admitting: Internal Medicine

## 2018-03-17 DIAGNOSIS — E785 Hyperlipidemia, unspecified: Secondary | ICD-10-CM

## 2018-03-17 DIAGNOSIS — I700 Atherosclerosis of aorta: Secondary | ICD-10-CM

## 2018-03-17 MED ORDER — ATORVASTATIN CALCIUM 10 MG PO TABS: 10.0000 mg | ORAL_TABLET | Freq: Every day | ORAL | 0 refills | Status: DC

## 2018-03-17 NOTE — Telephone Encounter (Signed)
Patient called to follow up on his refill that was declined because they said he needed an appointment.  Patient does have an appointment scheduled for 03/29/2018. Please advise and refill the medication.  Please call if there are any questions.  CB [phone].

## 2018-03-17 NOTE — Telephone Encounter (Signed)
erx sent for 30 day and no refills.

## 2018-03-17 NOTE — Addendum Note (Signed)
Addended by: Johney Frame, Adelina Mings M on: 03/17/2018 02:00 PM   Modules accepted: Orders

## 2018-03-29 ENCOUNTER — Encounter: Payer: Self-pay | Admitting: Internal Medicine

## 2018-03-29 ENCOUNTER — Ambulatory Visit (INDEPENDENT_AMBULATORY_CARE_PROVIDER_SITE_OTHER): Payer: Medicare Other | Admitting: Internal Medicine

## 2018-03-29 ENCOUNTER — Other Ambulatory Visit (INDEPENDENT_AMBULATORY_CARE_PROVIDER_SITE_OTHER): Payer: Medicare Other

## 2018-03-29 VITALS — BP 146/98 | HR 76 | Temp 98.3°F | Ht 76.0 in | Wt 206.8 lb

## 2018-03-29 DIAGNOSIS — C61 Malignant neoplasm of prostate: Secondary | ICD-10-CM

## 2018-03-29 DIAGNOSIS — J301 Allergic rhinitis due to pollen: Secondary | ICD-10-CM | POA: Diagnosis not present

## 2018-03-29 DIAGNOSIS — I10 Essential (primary) hypertension: Secondary | ICD-10-CM | POA: Diagnosis not present

## 2018-03-29 DIAGNOSIS — E785 Hyperlipidemia, unspecified: Secondary | ICD-10-CM

## 2018-03-29 DIAGNOSIS — Z0000 Encounter for general adult medical examination without abnormal findings: Secondary | ICD-10-CM

## 2018-03-29 DIAGNOSIS — J0100 Acute maxillary sinusitis, unspecified: Secondary | ICD-10-CM

## 2018-03-29 LAB — COMPREHENSIVE METABOLIC PANEL
ALBUMIN: 4.2 g/dL (ref 3.5–5.2)
ALT: 14 U/L (ref 0–53)
AST: 18 U/L (ref 0–37)
Alkaline Phosphatase: 56 U/L (ref 39–117)
BUN: 18 mg/dL (ref 6–23)
CO2: 27 mEq/L (ref 19–32)
Calcium: 9.6 mg/dL (ref 8.4–10.5)
Chloride: 102 mEq/L (ref 96–112)
Creatinine, Ser: 1 mg/dL (ref 0.40–1.50)
GFR: 73.15 mL/min (ref >60.00)
Glucose, Bld: 97 mg/dL (ref 70–99)
Potassium: 4.1 mEq/L (ref 3.5–5.1)
Sodium: 137 mEq/L (ref 135–145)
Total Bilirubin: 0.4 mg/dL (ref 0.2–1.2)
Total Protein: 6.5 g/dL (ref 6.0–8.3)

## 2018-03-29 LAB — CBC WITH DIFFERENTIAL/PLATELET
Basophils Absolute: 0 10*3/uL (ref 0.0–0.1)
Basophils Relative: 0.8 % (ref 0.0–3.0)
Eosinophils Absolute: 0.1 10*3/uL (ref 0.0–0.7)
Eosinophils Relative: 2.2 % (ref 0.0–5.0)
HEMATOCRIT: 40.8 % (ref 39.0–52.0)
HEMOGLOBIN: 13.7 g/dL (ref 13.0–17.0)
LYMPHS PCT: 38.6 % (ref 12.0–46.0)
Lymphs Abs: 2.4 10*3/uL (ref 0.7–4.0)
MCHC: 33.6 g/dL (ref 30.0–36.0)
MCV: 91.4 fl (ref 78.0–100.0)
Monocytes Absolute: 0.6 10*3/uL (ref 0.1–1.0)
Monocytes Relative: 10.4 % (ref 3.0–12.0)
Neutro Abs: 3 10*3/uL (ref 1.4–7.7)
Neutrophils Relative %: 48 % (ref 43.0–77.0)
Platelets: 234 10*3/uL (ref 150.0–400.0)
RBC: 4.47 Mil/uL (ref 4.22–5.81)
RDW: 13.5 % (ref 11.5–15.5)
WBC: 6.2 10*3/uL (ref 4.0–10.5)

## 2018-03-29 LAB — LDL CHOLESTEROL, DIRECT: Direct LDL: 64 mg/dL

## 2018-03-29 LAB — PSA: PSA: 0.17 ng/mL (ref 0.10–4.00)

## 2018-03-29 LAB — LIPID PANEL
Cholesterol: 171 mg/dL (ref 0–200)
HDL: 68.8 mg/dL (ref >39.00)
NonHDL: 101.94
Total CHOL/HDL Ratio: 2
Triglycerides: 234 mg/dL — ABNORMAL HIGH (ref 0.0–149.0)
VLDL: 46.8 mg/dL — ABNORMAL HIGH (ref 0.0–40.0)

## 2018-03-29 LAB — TSH: TSH: 1.7 u[IU]/mL (ref 0.35–4.50)

## 2018-03-29 MED ORDER — AMOXICILLIN-POT CLAVULANATE 875-125 MG PO TABS: 1.0000 | ORAL_TABLET | Freq: Two times a day (BID) | ORAL | 0 refills | Status: AC

## 2018-03-29 MED ORDER — OLMESARTAN MEDOXOMIL 20 MG PO TABS: 20.0000 mg | ORAL_TABLET | Freq: Every day | ORAL | 0 refills | Status: DC

## 2018-03-29 MED ORDER — AZELASTINE HCL 0.1 % NA SOLN: 1.0000 | Freq: Two times a day (BID) | NASAL | 1 refills | Status: DC

## 2018-03-29 NOTE — Patient Instructions (Signed)

## 2018-03-29 NOTE — Progress Notes (Signed)
Subjective:  Patient ID: Philip [name], male    DOB: [birth year]  Age: 73 y.o. MRN: [id number]  CC: Hypertension and Annual Exam   HPI Kingsly Kloepfer presents for a CPX.  He complains of a one-month history of runny nose producing brown phlegm, postnasal drip, nasal congestion, and facial pain.  He has not gotten much symptom relief with Allegra and Flonase.  He is very active and denies any recent episodes of CP or DOE.  He does not monitor his blood pressure at home.  His blood pressure is consistently elevated when he sees other providers.  Past Medical History:  Diagnosis Date  . Cancer ([place])    PROSTATE  . Edema leg Sept 28, 2016   left leg from foot to thigh, increasinly worse over last 4 weeks  . Hypoglycemia   . Swelling LAST 30 DAYS DR [facility] AWARE   LEFT LEG AND FOOT   Past Surgical History:  Procedure Laterality Date  . GUM SURGERY     TEETH IMPLANTS ALSO  . ORCHIECTOMY Bilateral 01/03/2015   Procedure: ORCHIECTOMY;  Surgeon: Alexis Frock, MD;  Location: [facility];  Service: Urology;  Laterality: Bilateral;    reports that he has never smoked. He has never used smokeless tobacco. He reports current alcohol use of about 1.0 standard drinks of alcohol per week. He reports that he does not use drugs. family history includes CAD in his father; CVA in his mother; Osteoarthritis in his brother. Allergies  Allergen Reactions  . Prednisone Anxiety    Outpatient Medications Prior to Visit  Medication Sig Dispense Refill  . aspirin EC 81 MG tablet Take 81 mg by mouth daily.    Marland Kitchen atorvastatin (LIPITOR) 10 MG tablet Take 1 tablet (10 mg total) by mouth daily. 30 tablet 0  . BIOTIN 5000 PO Take 1 tablet by mouth daily.    . Calcium Carbonate-Vitamin D3 (CALCIUM 600+D3) 600-400 MG-UNIT TABS Take 1 tablet by mouth daily.    . Denosumab (XGEVA [place]) Inject into the skin.    . fexofenadine (ALLEGRA) 180 MG tablet Take 180 mg by mouth daily.    . fluticasone (FLONASE) 50 MCG/ACT nasal  spray Place 2 sprays into both nostrils daily. 16 g 2  . Multiple Vitamin (MULTIVITAMIN) tablet Take 1 tablet by mouth daily.    Marland Kitchen ZYTIGA 250 MG tablet TAKE 4 TABLETS BY MOUTH ONCE DAILY AS DIRECTED.  TAKE 1 HOUR BEFORE OR 2 HOURS AFTER A MEAL 120 tablet 11  . hydrOXYzine (ATARAX/VISTARIL) 10 MG tablet Take 10 mg by mouth every 8 (eight) hours as needed.  10  . lidocaine-prilocaine (EMLA) cream Apply 1 application topically as needed. Apply one teaspoon topically to port 1-2 hours prior to access. 30 g 2  . tretinoin (RETIN-A) 0.1 % cream Apply 1 application topically at bedtime.    . triamcinolone cream (KENALOG) 0.1 % Apply 1 application topically 2 (two) times daily. 80 g 0   No facility-administered medications prior to visit.     ROS Review of Systems  Constitutional: Negative for chills, fatigue and fever.  HENT: Positive for postnasal drip, rhinorrhea, sinus pressure and sinus pain. Negative for congestion, facial swelling, nosebleeds, sore throat and trouble swallowing.   Eyes: Negative.   Respiratory: Negative for cough, shortness of breath and wheezing.   Cardiovascular: Negative for chest pain, palpitations and leg swelling.  Gastrointestinal: Negative for abdominal pain, constipation, diarrhea, nausea and vomiting.  Endocrine: Negative.   Genitourinary: Negative.  Negative for difficulty  urinating and hematuria.  Musculoskeletal: Negative for arthralgias, back pain, myalgias and neck pain.  Skin: Negative.  Negative for color change.  Neurological: Negative.  Negative for dizziness, weakness, light-headedness and headaches.  Hematological: Negative for adenopathy. Does not bruise/bleed easily.  Psychiatric/Behavioral: Negative.     Objective:  BP (!) 146/98 (BP Location: Left Arm, Patient Position: Sitting, Cuff Size: Large)   Pulse 76   Temp 98.3 F (36.8 C) (Oral)   Ht 6\' 4"  (1.93 m)   Wt 206 lb 12 oz (93.8 kg)   SpO2 99%   BMI 25.17 kg/m   BP Readings from Last 3  Encounters:  03/29/18 (!) 146/98  02/23/18 (!) 148/98  01/21/18 (!) 168/87    Wt Readings from Last 3 Encounters:  03/29/18 206 lb 12 oz (93.8 kg)  02/23/18 201 lb 8 oz (91.4 kg)  01/21/18 199 lb 4.8 oz (90.4 kg)    Physical Exam Vitals signs reviewed.  Constitutional:      Appearance: He is not ill-appearing or diaphoretic.  HENT:     Nose: Rhinorrhea present. No nasal tenderness, mucosal edema or congestion.     Right Nostril: No foreign body or epistaxis.     Left Nostril: No foreign body or epistaxis.     Right Turbinates: Not swollen.     Left Turbinates: Not swollen.     Right Sinus: Maxillary sinus tenderness present. No frontal sinus tenderness.     Left Sinus: Maxillary sinus tenderness present. No frontal sinus tenderness.     Mouth/Throat:     Mouth: Mucous membranes are moist.     Pharynx: Oropharynx is clear. No oropharyngeal exudate.  Eyes:     Conjunctiva/sclera: Conjunctivae normal.  Neck:     Musculoskeletal: Normal range of motion and neck supple. No neck rigidity.  Cardiovascular:     Rate and Rhythm: Normal rate and regular rhythm.     Heart sounds: No murmur. No gallop.      Comments: EKG ---  Sinus  Rhythm  WITHIN NORMAL LIMITS  Pulmonary:     Effort: Pulmonary effort is normal.     Breath sounds: No stridor. No wheezing or rhonchi.  Abdominal:     General: Abdomen is flat.     Palpations: There is no hepatomegaly, splenomegaly or mass.     Tenderness: There is no abdominal tenderness. There is no guarding.  Musculoskeletal: Normal range of motion.        General: No swelling.     Right lower leg: No edema.     Left lower leg: No edema.  Lymphadenopathy:     Cervical: No cervical adenopathy.  Skin:    General: Skin is warm and dry.     Coloration: Skin is not pale.     Findings: No erythema or rash.  Neurological:     General: No focal deficit present.     Mental Status: He is oriented to person, place, and time. Mental status is at  baseline.     Lab Results  Component Value Date   WBC 6.2 03/29/2018   HGB 13.7 03/29/2018   HCT 40.8 03/29/2018   PLT 234.0 03/29/2018   GLUCOSE 97 03/29/2018   CHOL 171 03/29/2018   TRIG 234.0 (H) 03/29/2018   HDL 68.80 03/29/2018   LDLDIRECT 64.0 03/29/2018   ALT 14 03/29/2018   AST 18 03/29/2018   NA 137 03/29/2018   K 4.1 03/29/2018   CL 102 03/29/2018   CREATININE 1.00 03/29/2018  BUN 18 03/29/2018   CO2 27 03/29/2018   TSH 1.70 03/29/2018   PSA 0.17 03/29/2018   INR 1.06 02/09/2015    Nm Bone Scan Whole Body  Result Date: 02/19/2018 CLINICAL DATA:  Prostate cancer with history of osseous metastatic disease. Ongoing oral chemotherapy. EXAM: NUCLEAR MEDICINE WHOLE BODY BONE SCAN TECHNIQUE: Whole body anterior and posterior images were obtained approximately 3 hours after intravenous injection of radiopharmaceutical. RADIOPHARMACEUTICALS:  19.9 mCi Technetium-59m MDP IV COMPARISON:  Abdominopelvic CT today. Previous bone scans 06/19/2017 and 03/04/2016. FINDINGS: There is widespread blastic metastatic disease on the patient's prior studies, including today's CT. The bone scan demonstrates stable low-level activity throughout the spine and pelvis. There is stable asymmetric activity at the right sternoclavicular joint. No new or increasing activity is identified. The soft tissue activity appears normal. IMPRESSION: No progressive disease. There is only mild residual heterogeneous activity in the spine, pelvis and right sternoclavicular joint, significantly improved from 03/04/2016, and likely all treated disease. Electronically Signed   By: Richardean Sale M.D.   On: 02/19/2018 15:06   Ct Abdomen Pelvis W Contrast  Result Date: 02/19/2018 CLINICAL DATA:  Prostate cancer with history of osseous metastatic disease. Ongoing oral chemotherapy. EXAM: CT ABDOMEN AND PELVIS WITH CONTRAST TECHNIQUE: Multidetector CT imaging of the abdomen and pelvis was performed using the standard  protocol following bolus administration of intravenous contrast. CONTRAST:  135mL OMNIPAQUE IOHEXOL 300 MG/ML  SOLN COMPARISON:  Abdominopelvic CT 01/13/2017. Bone scan 06/19/2017 and today. FINDINGS: Lower chest: Clear lung bases. No significant pleural or pericardial effusion. Hepatobiliary: There are several stable low-density hepatic lesions consistent with cysts. No new or enlarging lesions. No evidence of gallstones, gallbladder wall thickening or biliary dilatation. Pancreas: Unremarkable. No pancreatic ductal dilatation or surrounding inflammatory changes. Spleen: Normal in size without focal abnormality. Adrenals/Urinary Tract: Both adrenal glands appear normal. Stable small bilateral renal cysts. No evidence of urinary tract calculus, renal mass or hydronephrosis. Stable mild bladder wall thickening without apparent focal abnormality. Stomach/Bowel: No evidence of bowel wall thickening, distention or surrounding inflammatory change. The appendix appears normal. Sigmoid colon redundancy again noted. Vascular/Lymphatic: There are no residual enlarged abdominal or pelvic lymph nodes. Pelvic sidewall nodes noted previously are stable to improved, measuring 7 mm on the right (image 78/2) and 7 mm on the left (image 77/2, previously 11 mm). Mild aortic and branch vessel atherosclerosis. No acute vascular findings. Reproductive: Stable appearance of the prostate gland and seminal vesicles. Other: No evidence of abdominal wall mass or hernia. No ascites. Musculoskeletal: Widespread blastic osseous metastatic disease throughout the visualized ribs, sternum, spine, pelvis and proximal femurs is grossly stable. No lytic lesion or pathologic fracture identified. There is lower lumbar spondylosis with central stenosis at L3-4 and L4-5. IMPRESSION: 1. Further improvement in pelvic lymphadenopathy. No residual enlarged abdominopelvic lymph nodes identified. 2. Stable widespread blastic osseous metastatic disease, likely  treated. 3. Stable cysts within the liver and kidneys. 4. No acute findings. Electronically Signed   By: Richardean Sale M.D.   On: 02/19/2018 15:01    Assessment & Plan:   Koray was seen today for hypertension and annual exam.  Diagnoses and all orders for this visit:  Prostate cancer [facility])- His PSA is down to 0.17.  This is very reassuring that the disease is under excellent control. -     PSA; Future  Hyperlipidemia LDL goal <100- He has achieved his LDL goal and is doing well on the statin. -     Lipid panel;  Future -     CBC with Differential/Platelet; Future -     Comprehensive metabolic panel; Future -     TSH; Future  Essential hypertension- His blood pressure is not adequately well controlled.  Labs are negative for secondary causes or endorgan damage.  His EKG is negative for LVH or ischemia.  I have asked him to start taking an ARB. -     EKG 12-Lead -     olmesartan (BENICAR) 20 MG tablet; Take 1 tablet (20 mg total) by mouth daily.  Seasonal allergic rhinitis due to pollen -     azelastine (ASTELIN) 0.1 % nasal spray; Place 1 spray into both nostrils 2 (two) times daily. Use in each nostril as directed  Acute non-recurrent maxillary sinusitis -     amoxicillin-clavulanate (AUGMENTIN) 875-125 MG tablet; Take 1 tablet by mouth 2 (two) times daily for 10 days.   I have discontinued Pilar Plate Bark's tretinoin, lidocaine-prilocaine, hydrOXYzine, and triamcinolone cream. I am also having him start on amoxicillin-clavulanate, azelastine, and olmesartan. Additionally, I am having him maintain his fexofenadine, Calcium Carbonate-Vitamin D3, BIOTIN 5000 PO, Denosumab (XGEVA [place]), aspirin EC, fluticasone, multivitamin, ZYTIGA, and atorvastatin.  Meds ordered this encounter  Medications  . amoxicillin-clavulanate (AUGMENTIN) 875-125 MG tablet    Sig: Take 1 tablet by mouth 2 (two) times daily for 10 days.    Dispense:  20 tablet    Refill:  0  . azelastine (ASTELIN) 0.1 % nasal  spray    Sig: Place 1 spray into both nostrils 2 (two) times daily. Use in each nostril as directed    Dispense:  90 mL    Refill:  1  . olmesartan (BENICAR) 20 MG tablet    Sig: Take 1 tablet (20 mg total) by mouth daily.    Dispense:  90 tablet    Refill:  0   See AVS for instructions about healthy living and anticipatory guidance.  Follow-up: Return in about 3 months (around 06/28/2018).  Scarlette Calico, MD

## 2018-03-30 DIAGNOSIS — Z0000 Encounter for general adult medical examination without abnormal findings: Secondary | ICD-10-CM | POA: Insufficient documentation

## 2018-03-30 DIAGNOSIS — I10 Essential (primary) hypertension: Secondary | ICD-10-CM

## 2018-03-30 HISTORY — DX: Essential (primary) hypertension: I10

## 2018-03-30 NOTE — Assessment & Plan Note (Signed)

## 2018-03-31 ENCOUNTER — Other Ambulatory Visit: Payer: Self-pay | Admitting: Internal Medicine

## 2018-03-31 DIAGNOSIS — J301 Allergic rhinitis due to pollen: Secondary | ICD-10-CM

## 2018-04-06 ENCOUNTER — Inpatient Hospital Stay: Payer: Medicare Other | Attending: Oncology

## 2018-04-06 ENCOUNTER — Inpatient Hospital Stay: Payer: Medicare Other

## 2018-04-06 DIAGNOSIS — Z7952 Long term (current) use of systemic steroids: Secondary | ICD-10-CM | POA: Insufficient documentation

## 2018-04-06 DIAGNOSIS — Z9079 Acquired absence of other genital organ(s): Secondary | ICD-10-CM | POA: Insufficient documentation

## 2018-04-06 DIAGNOSIS — C7951 Secondary malignant neoplasm of bone: Secondary | ICD-10-CM | POA: Insufficient documentation

## 2018-04-06 DIAGNOSIS — I10 Essential (primary) hypertension: Secondary | ICD-10-CM | POA: Diagnosis not present

## 2018-04-06 DIAGNOSIS — C61 Malignant neoplasm of prostate: Secondary | ICD-10-CM

## 2018-04-06 DIAGNOSIS — Z95828 Presence of other vascular implants and grafts: Secondary | ICD-10-CM

## 2018-04-06 DIAGNOSIS — Z452 Encounter for adjustment and management of vascular access device: Secondary | ICD-10-CM | POA: Diagnosis not present

## 2018-04-06 DIAGNOSIS — Z79899 Other long term (current) drug therapy: Secondary | ICD-10-CM | POA: Insufficient documentation

## 2018-04-06 DIAGNOSIS — Z7982 Long term (current) use of aspirin: Secondary | ICD-10-CM | POA: Diagnosis not present

## 2018-04-06 LAB — CMP (CANCER CENTER ONLY)
ALBUMIN: 3.7 g/dL (ref 3.5–5.0)
ALT: 14 U/L (ref 0–44)
AST: 20 U/L (ref 15–41)
Alkaline Phosphatase: 61 U/L (ref 38–126)
Anion gap: 7 (ref 5–15)
BUN: 18 mg/dL (ref 8–23)
CO2: 27 mmol/L (ref 22–32)
Calcium: 9.4 mg/dL (ref 8.9–10.3)
Chloride: 105 mmol/L (ref 98–111)
Creatinine: 0.85 mg/dL (ref 0.61–1.24)
GFR, Est AFR Am: >60 mL/min (ref >60)
GFR, Estimated: >60 mL/min (ref >60)
GLUCOSE: 107 mg/dL — AB (ref 70–99)
Potassium: 3.9 mmol/L (ref 3.5–5.1)
Sodium: 139 mmol/L (ref 135–145)
Total Bilirubin: 0.6 mg/dL (ref 0.3–1.2)
Total Protein: 6.5 g/dL (ref 6.5–8.1)

## 2018-04-06 LAB — CBC WITH DIFFERENTIAL (CANCER CENTER ONLY)
Abs Immature Granulocytes: 0.02 K/uL (ref 0.00–0.07)
Basophils Absolute: 0 K/uL (ref 0.0–0.1)
Basophils Relative: 1 %
Eosinophils Absolute: 0.1 K/uL (ref 0.0–0.5)
Eosinophils Relative: 2 %
HCT: 38.1 % — ABNORMAL LOW (ref 39.0–52.0)
Hemoglobin: 12.5 g/dL — ABNORMAL LOW (ref 13.0–17.0)
Immature Granulocytes: 0 %
Lymphocytes Relative: 35 %
Lymphs Abs: 2.1 K/uL (ref 0.7–4.0)
MCH: 30.5 pg (ref 26.0–34.0)
MCHC: 32.8 g/dL (ref 30.0–36.0)
MCV: 92.9 fL (ref 80.0–100.0)
Monocytes Absolute: 0.6 K/uL (ref 0.1–1.0)
Monocytes Relative: 10 %
Neutro Abs: 3.2 K/uL (ref 1.7–7.7)
Neutrophils Relative %: 52 %
Platelet Count: 193 K/uL (ref 150–400)
RBC: 4.1 MIL/uL — ABNORMAL LOW (ref 4.22–5.81)
RDW: 13.7 % (ref 11.5–15.5)
WBC Count: 6 K/uL (ref 4.0–10.5)
nRBC: 0 % (ref 0.0–0.2)

## 2018-04-06 MED ORDER — HEPARIN SOD (PORK) LOCK FLUSH 100 UNIT/ML IV SOLN
500.0000 [IU] | Freq: Once | INTRAVENOUS | Status: AC
  Administered 2018-04-06: 500 [IU]
  Filled 2018-04-06: qty 5

## 2018-04-06 MED ORDER — SODIUM CHLORIDE 0.9% FLUSH
10.0000 mL | Freq: Once | INTRAVENOUS | Status: AC
  Administered 2018-04-06: 10 mL
  Filled 2018-04-06: qty 10

## 2018-04-07 ENCOUNTER — Telehealth: Payer: Self-pay | Admitting: Oncology

## 2018-04-07 ENCOUNTER — Inpatient Hospital Stay (HOSPITAL_BASED_OUTPATIENT_CLINIC_OR_DEPARTMENT_OTHER): Payer: Medicare Other | Admitting: Oncology

## 2018-04-07 VITALS — BP 136/80 | HR 83 | Temp 97.7°F | Resp 18 | Ht 76.0 in | Wt 201.8 lb

## 2018-04-07 DIAGNOSIS — C61 Malignant neoplasm of prostate: Secondary | ICD-10-CM

## 2018-04-07 DIAGNOSIS — Z7982 Long term (current) use of aspirin: Secondary | ICD-10-CM | POA: Diagnosis not present

## 2018-04-07 DIAGNOSIS — Z79899 Other long term (current) drug therapy: Secondary | ICD-10-CM | POA: Diagnosis not present

## 2018-04-07 DIAGNOSIS — Z452 Encounter for adjustment and management of vascular access device: Secondary | ICD-10-CM | POA: Diagnosis not present

## 2018-04-07 DIAGNOSIS — Z7952 Long term (current) use of systemic steroids: Secondary | ICD-10-CM | POA: Diagnosis not present

## 2018-04-07 DIAGNOSIS — Z9079 Acquired absence of other genital organ(s): Secondary | ICD-10-CM | POA: Diagnosis not present

## 2018-04-07 DIAGNOSIS — I10 Essential (primary) hypertension: Secondary | ICD-10-CM

## 2018-04-07 DIAGNOSIS — C7951 Secondary malignant neoplasm of bone: Secondary | ICD-10-CM | POA: Diagnosis not present

## 2018-04-07 LAB — PROSTATE-SPECIFIC AG, SERUM (LABCORP): Prostate Specific Ag, Serum: 0.2 ng/mL (ref 0.0–4.0)

## 2018-04-07 NOTE — Progress Notes (Signed)
Hematology and Oncology Follow Up Visit  Philip [name] [id number] [birth year] 73 y.o. 04/07/2018 1:29 PM Philip [name], MDJones, [name], MD   Principle Diagnosis: 73 year old man with advanced prostate cancer with disease to the bone and lymphadenopathy diagnosed in 2016.  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  Prior Therapy: Status post orchiectomy done on November 2016.  Taxotere chemotherapy at 75 mg/m to start on 02/16/2015. He is completed 6 cycles of therapy in March 2017.  Current therapy: Zytiga 1000 mg daily with prednisone at 5 mg daily started on 09/13/2015.  Prednisone has been tapered.  Interim History:  Philip [name] is here for a follow-up visit by himself.  Since the last visit, he reports no major changes in his health.  Continues to be active and attends to activities of daily living.  He tolerated Zytiga in the last month without any issues.  He was started on Benicar with improvement in his blood pressure.  Denied any recent hospitalization or illnesses.  He denies any bone pain or pathological fractures.  His performance status and quality of life remain excellent.  Patient denied any alteration mental status, neuropathy, confusion or dizziness.  Denies any headaches or lethargy.  Denies any night sweats, weight loss or changes in appetite.  Denied orthopnea, dyspnea on exertion or chest discomfort.  Denies shortness of breath, difficulty breathing hemoptysis or cough.  Denies any abdominal distention, nausea, early satiety or dyspepsia.  Denies any hematuria, frequency, dysuria or nocturia.  Denies any skin irritation, dryness or rash.  Denies any ecchymosis or petechiae.  Denies any lymphadenopathy or clotting.  Denies any heat or cold intolerance.  Denies any anxiety or depression.  Remaining review of system is negative.    Medications: I have reviewed the patient's current medications.   Current Outpatient Medications  Medication  Sig Dispense Refill  . amoxicillin-clavulanate (AUGMENTIN) 875-125 MG tablet Take 1 tablet by mouth 2 (two) times daily for 10 days. 20 tablet 0  . aspirin EC 81 MG tablet Take 81 mg by mouth daily.    Marland Kitchen atorvastatin (LIPITOR) 10 MG tablet Take 1 tablet (10 mg total) by mouth daily. 30 tablet 0  . azelastine (ASTELIN) 0.1 % nasal spray Place 1 spray into both nostrils 2 (two) times daily. Use in each nostril as directed 90 mL 1  . BIOTIN 5000 PO Take 1 tablet by mouth daily.    . Calcium Carbonate-Vitamin D3 (CALCIUM 600+D3) 600-400 MG-UNIT TABS Take 1 tablet by mouth daily.    . Denosumab (XGEVA [place]) Inject into the skin.    . fexofenadine (ALLEGRA) 180 MG tablet Take 180 mg by mouth daily.    . fluticasone (FLONASE) 50 MCG/ACT nasal spray SPRAY 2 SPRAYS INTO EACH NOSTRIL EVERY DAY 48 g 2  . Multiple Vitamin (MULTIVITAMIN) tablet Take 1 tablet by mouth daily.    Marland Kitchen olmesartan (BENICAR) 20 MG tablet Take 1 tablet (20 mg total) by mouth daily. 90 tablet 0  . ZYTIGA 250 MG tablet TAKE 4 TABLETS BY MOUTH ONCE DAILY AS DIRECTED.  TAKE 1 HOUR BEFORE OR 2 HOURS AFTER A MEAL 120 tablet 11   No current facility-administered medications for this visit.      Allergies:  Allergies  Allergen Reactions  . Prednisone Anxiety    Past Medical History, Surgical history, Social history, and Family History remain unchanged on review.   Physical Exam:  Blood pressure 136/80, pulse 83, temperature 97.7 F (36.5 C),  temperature source Oral, resp. rate 18, height 6\' 4"  (1.93 m), weight 201 lb 12.8 oz (91.5 kg), SpO2 100 %.    ECOG: 0   General appearance: Comfortable appearing without any discomfort Head: Normocephalic without any trauma Oropharynx: Mucous membranes are moist and pink without any thrush or ulcers. Eyes: Pupils are equal and round reactive to light. Lymph nodes: No cervical, supraclavicular, inguinal or axillary lymphadenopathy.   Heart:regular rate and rhythm.  S1 and S2 without leg  edema. Lung: Clear without any rhonchi or wheezes.  No dullness to percussion. Abdomin: Soft, nontender, nondistended with good bowel sounds.  No hepatosplenomegaly. Musculoskeletal: No joint deformity or effusion.  Full range of motion noted. Neurological: No deficits noted on motor, sensory and deep tendon reflex exam. Skin: No petechial rash or dryness.  Appeared moist.  Psychiatric: Mood and affect appeared appropriate.          Lab Results: Lab Results  Component Value Date   WBC 6.0 04/06/2018   HGB 12.5 (L) 04/06/2018   HCT 38.1 (L) 04/06/2018   MCV 92.9 04/06/2018   PLT 193 04/06/2018     Chemistry      Component Value Date/Time   NA 139 04/06/2018 1315   NA 139 02/10/2017 1323   K 3.9 04/06/2018 1315   K 3.4 (L) 02/10/2017 1323   CL 105 04/06/2018 1315   CO2 27 04/06/2018 1315   CO2 23 02/10/2017 1323   BUN 18 04/06/2018 1315   BUN 13.6 02/10/2017 1323   CREATININE 0.85 04/06/2018 1315   CREATININE 0.9 02/10/2017 1323      Component Value Date/Time   CALCIUM 9.4 04/06/2018 1315   CALCIUM 9.2 02/10/2017 1323   ALKPHOS 61 04/06/2018 1315   ALKPHOS 50 02/10/2017 1323   AST 20 04/06/2018 1315   AST 20 02/10/2017 1323   ALT 14 04/06/2018 1315   ALT 15 02/10/2017 1323   BILITOT 0.6 04/06/2018 1315   BILITOT 0.52 02/10/2017 1323        Results for Philip [name] (MRN [id number]) as of 04/07/2018 13:13  Ref. Range 04/06/2018 13:15  Prostate Specific Ag, Serum Latest Ref Range: 0.0 - 4.0 ng/mL 0.2       Impression and Plan:  73 year old man with:  1.  Advanced prostate cancer that is currently castration-resistant with bone and lymphadenopathy diagnosed in 2016.    He remains on Zytiga with excellent response to therapy and PSA remains undetectable.  Staging work-up obtained in December 2019 was reviewed again and showed excellent response to therapy.  Risks and benefits of continuing this medication long-term was reviewed today.  Different salvage  therapies will include systemic chemotherapy, Philip [name] and Philip [name] are also reiterated.    2. IV access: Port-A-Cath remains in place and will be used for blood testing every month.  3. Hypertension: His blood pressure is better on Benicar at this time.  We will continue to monitor on Zytiga.  4.  Androgen depravation: Testosterone level remains castrate after orchiectomy.  5.  Skeletal related events prevention: Delton See has been on hold at this time and will be resumed likely in April or May 2020 after completing dental procedure.  6.  Prognosis: His performance status remain excellent and aggressive therapy is warranted given his performance status.  His disease however remains incurable.  7. Follow-up: In 6 weeks to follow his progress.  25 minutes was spent with the patient face-to-face today.  More than 50% of time was dedicated to discussing his disease status,  laboratory data and addressing complications related therapy.   Zola Button, MD 2/5/20201:29 PM

## 2018-04-07 NOTE — Telephone Encounter (Signed)
Scheduled appt per 02/05 los. ° °Printed calendar and avs. °

## 2018-04-16 ENCOUNTER — Other Ambulatory Visit: Payer: Self-pay | Admitting: Internal Medicine

## 2018-04-16 DIAGNOSIS — E785 Hyperlipidemia, unspecified: Secondary | ICD-10-CM

## 2018-04-16 DIAGNOSIS — I700 Atherosclerosis of aorta: Secondary | ICD-10-CM

## 2018-05-06 ENCOUNTER — Telehealth: Payer: Self-pay | Admitting: Oncology

## 2018-05-06 NOTE — Telephone Encounter (Signed)
Call day moved appt from 03/18 to 03/11 and lab 03/17 to 03/10. Left VM for patient and mailed schedule.

## 2018-05-11 ENCOUNTER — Inpatient Hospital Stay: Payer: Medicare Other

## 2018-05-11 ENCOUNTER — Inpatient Hospital Stay: Payer: Medicare Other | Attending: Oncology

## 2018-05-11 DIAGNOSIS — I10 Essential (primary) hypertension: Secondary | ICD-10-CM | POA: Insufficient documentation

## 2018-05-11 DIAGNOSIS — Z7982 Long term (current) use of aspirin: Secondary | ICD-10-CM | POA: Insufficient documentation

## 2018-05-11 DIAGNOSIS — C61 Malignant neoplasm of prostate: Secondary | ICD-10-CM | POA: Diagnosis not present

## 2018-05-11 DIAGNOSIS — Z79899 Other long term (current) drug therapy: Secondary | ICD-10-CM | POA: Insufficient documentation

## 2018-05-11 DIAGNOSIS — Z9079 Acquired absence of other genital organ(s): Secondary | ICD-10-CM | POA: Insufficient documentation

## 2018-05-11 DIAGNOSIS — C7951 Secondary malignant neoplasm of bone: Secondary | ICD-10-CM | POA: Insufficient documentation

## 2018-05-11 DIAGNOSIS — Z95828 Presence of other vascular implants and grafts: Secondary | ICD-10-CM

## 2018-05-11 LAB — CBC WITH DIFFERENTIAL (CANCER CENTER ONLY)
Abs Immature Granulocytes: 0.01 10*3/uL (ref 0.00–0.07)
BASOS PCT: 0 %
Basophils Absolute: 0 10*3/uL (ref 0.0–0.1)
EOS ABS: 0.1 10*3/uL (ref 0.0–0.5)
Eosinophils Relative: 1 %
HCT: 39.2 % (ref 39.0–52.0)
Hemoglobin: 12.6 g/dL — ABNORMAL LOW (ref 13.0–17.0)
Immature Granulocytes: 0 %
LYMPHS ABS: 2.1 10*3/uL (ref 0.7–4.0)
Lymphocytes Relative: 30 %
MCH: 29.8 pg (ref 26.0–34.0)
MCHC: 32.1 g/dL (ref 30.0–36.0)
MCV: 92.7 fL (ref 80.0–100.0)
Monocytes Absolute: 0.6 10*3/uL (ref 0.1–1.0)
Monocytes Relative: 9 %
Neutro Abs: 4.2 10*3/uL (ref 1.7–7.7)
Neutrophils Relative %: 60 %
Platelet Count: 180 10*3/uL (ref 150–400)
RBC: 4.23 MIL/uL (ref 4.22–5.81)
RDW: 13.5 % (ref 11.5–15.5)
WBC Count: 7 10*3/uL (ref 4.0–10.5)
nRBC: 0 % (ref 0.0–0.2)

## 2018-05-11 LAB — CMP (CANCER CENTER ONLY)
ALT: 15 U/L (ref 0–44)
ANION GAP: 9 (ref 5–15)
AST: 19 U/L (ref 15–41)
Albumin: 3.7 g/dL (ref 3.5–5.0)
Alkaline Phosphatase: 64 U/L (ref 38–126)
BILIRUBIN TOTAL: 0.5 mg/dL (ref 0.3–1.2)
BUN: 19 mg/dL (ref 8–23)
CO2: 24 mmol/L (ref 22–32)
Calcium: 9.1 mg/dL (ref 8.9–10.3)
Chloride: 106 mmol/L (ref 98–111)
Creatinine: 0.86 mg/dL (ref 0.61–1.24)
GFR, Est AFR Am: >60 mL/min (ref >60)
GFR, Estimated: >60 mL/min (ref >60)
Glucose, Bld: 117 mg/dL — ABNORMAL HIGH (ref 70–99)
Potassium: 3.8 mmol/L (ref 3.5–5.1)
Sodium: 139 mmol/L (ref 135–145)
Total Protein: 6.5 g/dL (ref 6.5–8.1)

## 2018-05-11 MED ORDER — SODIUM CHLORIDE 0.9% FLUSH
10.0000 mL | Freq: Once | INTRAVENOUS | Status: AC
  Administered 2018-05-11: 10 mL
  Filled 2018-05-11: qty 10

## 2018-05-11 MED ORDER — HEPARIN SOD (PORK) LOCK FLUSH 100 UNIT/ML IV SOLN
500.0000 [IU] | Freq: Once | INTRAVENOUS | Status: AC
  Administered 2018-05-11: 500 [IU]
  Filled 2018-05-11: qty 5

## 2018-05-12 ENCOUNTER — Inpatient Hospital Stay (HOSPITAL_BASED_OUTPATIENT_CLINIC_OR_DEPARTMENT_OTHER): Payer: Medicare Other | Admitting: Oncology

## 2018-05-12 ENCOUNTER — Telehealth: Payer: Self-pay | Admitting: Oncology

## 2018-05-12 ENCOUNTER — Other Ambulatory Visit: Payer: Self-pay

## 2018-05-12 VITALS — BP 142/75 | HR 73 | Temp 97.8°F | Resp 17 | Ht 76.0 in | Wt 204.8 lb

## 2018-05-12 DIAGNOSIS — Z79899 Other long term (current) drug therapy: Secondary | ICD-10-CM | POA: Diagnosis not present

## 2018-05-12 DIAGNOSIS — C61 Malignant neoplasm of prostate: Secondary | ICD-10-CM

## 2018-05-12 DIAGNOSIS — Z7982 Long term (current) use of aspirin: Secondary | ICD-10-CM

## 2018-05-12 DIAGNOSIS — Z9079 Acquired absence of other genital organ(s): Secondary | ICD-10-CM | POA: Diagnosis not present

## 2018-05-12 DIAGNOSIS — C7951 Secondary malignant neoplasm of bone: Secondary | ICD-10-CM | POA: Diagnosis not present

## 2018-05-12 DIAGNOSIS — I10 Essential (primary) hypertension: Secondary | ICD-10-CM | POA: Diagnosis not present

## 2018-05-12 LAB — PROSTATE-SPECIFIC AG, SERUM (LABCORP): Prostate Specific Ag, Serum: 0.2 ng/mL (ref 0.0–4.0)

## 2018-05-12 NOTE — Telephone Encounter (Signed)
Gave avs and calendar ° °

## 2018-05-12 NOTE — Progress Notes (Signed)
Hematology and Oncology Follow Up Visit  Philip [name] [id number] 1946-02-16 73 y.o. 05/12/2018 2:59 PM Philip [name], MDJones, [name], MD   Principle Diagnosis: 73 year old man with castration-resistant prostate cancer diagnosed in 2016.  He presented with advanced disease including the bone and lymphadenopathy and PSA of 2900.   Prior Therapy: Status post orchiectomy done on November 2016.  Taxotere chemotherapy at 75 mg/m to start on 02/16/2015. He is completed 6 cycles of therapy in March 2017.  Current therapy: Zytiga 1000 mg daily with prednisone at 5 mg daily started on 09/13/2015.  Prednisone has been discontinued due to to poor tolerance.  Interim History:  Philip [name] returns today for a repeat evaluation.  Since the last visit, he reports no major complaints.  He continues to be active and attends to activities of daily living.  He tolerated Zytiga without any recent hospitalizations or illnesses.  He denies any increased blood pressure, edema or excessive fatigue.  He denies any bone pain or pathological fractures.  He denies any changes in his performance status or activity level   Patient denied headaches, blurry vision, syncope or seizures.  Denies any fevers, chills or sweats.  Denied chest pain, palpitation, orthopnea or leg edema.  Denied cough, wheezing or hemoptysis.  Denied nausea, vomiting or abdominal pain.  Denies any constipation or diarrhea.  Denies any frequency urgency or hesitancy.  Denies any arthralgias or myalgias.  Denies any skin rashes or lesions.  Denies any bleeding or clotting tendency.  Denies any easy bruising.  Denies any hair or nail changes.  Denies any anxiety or depression.  Remaining review of system is negative.     Medications: I have reviewed the patient's current medications.   Current Outpatient Medications  Medication Sig Dispense Refill  . aspirin EC 81 MG tablet Take 81 mg by mouth daily.    Marland Kitchen atorvastatin (LIPITOR) 10 MG tablet TAKE 1  TABLET BY MOUTH EVERY DAY 90 tablet 1  . azelastine (ASTELIN) 0.1 % nasal spray Place 1 spray into both nostrils 2 (two) times daily. Use in each nostril as directed 90 mL 1  . BIOTIN 5000 PO Take 1 tablet by mouth daily.    . Calcium Carbonate-Vitamin D3 (CALCIUM 600+D3) 600-400 MG-UNIT TABS Take 1 tablet by mouth daily.    . Denosumab (XGEVA [place]) Inject into the skin.    . fexofenadine (ALLEGRA) 180 MG tablet Take 180 mg by mouth daily.    . fluticasone (FLONASE) 50 MCG/ACT nasal spray SPRAY 2 SPRAYS INTO EACH NOSTRIL EVERY DAY 48 g 2  . Multiple Vitamin (MULTIVITAMIN) tablet Take 1 tablet by mouth daily.    Marland Kitchen olmesartan (BENICAR) 20 MG tablet Take 1 tablet (20 mg total) by mouth daily. 90 tablet 0  . ZYTIGA 250 MG tablet TAKE 4 TABLETS BY MOUTH ONCE DAILY AS DIRECTED.  TAKE 1 HOUR BEFORE OR 2 HOURS AFTER A MEAL 120 tablet 11   No current facility-administered medications for this visit.      Allergies:  Allergies  Allergen Reactions  . Prednisone Anxiety    Past Medical History, Surgical history, Social history, and Family History remain unchanged on review.   Physical Exam:  Blood pressure (!) 142/75, pulse 73, temperature 97.8 F (36.6 C), temperature source Oral, resp. rate 17, height 6\' 4"  (1.93 m), weight 204 lb 12.8 oz (92.9 kg), SpO2 99 %.    ECOG: 0   General appearance: Alert, awake without any distress. Head: Atraumatic without abnormalities Oropharynx:  Without any thrush or ulcers. Eyes: No scleral icterus. Lymph nodes: No lymphadenopathy noted in the cervical, supraclavicular, or axillary nodes Heart:regular rate and rhythm, without any murmurs or gallops.   Lung: Clear to auscultation without any rhonchi, wheezes or dullness to percussion. Abdomin: Soft, nontender without any shifting dullness or ascites. Musculoskeletal: No clubbing or cyanosis. Neurological: No motor or sensory deficits. Skin: No rashes or lesions.           Lab Results: Lab  Results  Component Value Date   WBC 7.0 05/11/2018   HGB 12.6 (L) 05/11/2018   HCT 39.2 05/11/2018   MCV 92.7 05/11/2018   PLT 180 05/11/2018     Chemistry      Component Value Date/Time   NA 139 05/11/2018 1229   NA 139 02/10/2017 1323   K 3.8 05/11/2018 1229   K 3.4 (L) 02/10/2017 1323   CL 106 05/11/2018 1229   CO2 24 05/11/2018 1229   CO2 23 02/10/2017 1323   BUN 19 05/11/2018 1229   BUN 13.6 02/10/2017 1323   CREATININE 0.86 05/11/2018 1229   CREATININE 0.9 02/10/2017 1323      Component Value Date/Time   CALCIUM 9.1 05/11/2018 1229   CALCIUM 9.2 02/10/2017 1323   ALKPHOS 64 05/11/2018 1229   ALKPHOS 50 02/10/2017 1323   AST 19 05/11/2018 1229   AST 20 02/10/2017 1323   ALT 15 05/11/2018 1229   ALT 15 02/10/2017 1323   BILITOT 0.5 05/11/2018 1229   BILITOT 0.52 02/10/2017 1323         Results for Philip, [name] (MRN [id number]) as of 05/12/2018 14:48  Ref. Range 03/29/2018 15:05 04/06/2018 13:15 05/11/2018 12:29  Prostate Specific Ag, Serum Latest Ref Range: 0.0 - 4.0 ng/mL  0.2 0.2       Impression and Plan:  73 year old man with:  1.  Castration-resistant prostate cancer with disease to the bone and lymphadenopathy.   He has tolerated Zytiga very well with PSA continues to be very low.  Risks and benefits of continuing this medication long-term was discussed today and he is agreeable to continue.  His long-term complication including hypertension and renal insufficiency.  The natural course of his disease was also reviewed including imaging studies that were completed in December.  He understands alternative therapy may be needed in the future in the form of Xtandi and potentially systemic chemotherapy.  For the time being, we will continue on Zytiga.    2. IV access: Port-A-Cath remains in use without any issues.  Will be flushed periodically.  3. Hypertension: Under control at this time with the current blood pressure medication.  4.  Androgen  depravation: He is status post orchiectomy with castrate level testosterone.  5.  Skeletal related events prevention: He has been on Xgeva which has been withheld because of dental surgery.  This will continue to be on hold till he is fully healed and completed all of his dental surgery.  6.  Prognosis: Therapy remains palliative although his prognosis is excellent given his excellent disease status control and performance status.  7. Follow-up: In 4 to 6 weeks for repeat evaluation.  25 minutes was spent with the patient face-to-face today.  More than 50% of time was spent on reviewing his disease status, treatment options, complications related to therapy and addressing future plan of care.  Zola Button, MD 3/11/20202:59 PM

## 2018-05-18 ENCOUNTER — Other Ambulatory Visit: Payer: Medicare Other

## 2018-05-19 ENCOUNTER — Ambulatory Visit: Payer: Medicare Other | Admitting: Oncology

## 2018-06-23 ENCOUNTER — Inpatient Hospital Stay: Payer: Medicare Other | Attending: Oncology

## 2018-06-23 ENCOUNTER — Inpatient Hospital Stay: Payer: Medicare Other

## 2018-06-23 ENCOUNTER — Other Ambulatory Visit: Payer: Self-pay

## 2018-06-23 DIAGNOSIS — Z79899 Other long term (current) drug therapy: Secondary | ICD-10-CM | POA: Diagnosis not present

## 2018-06-23 DIAGNOSIS — Z95828 Presence of other vascular implants and grafts: Secondary | ICD-10-CM

## 2018-06-23 DIAGNOSIS — Z888 Allergy status to other drugs, medicaments and biological substances status: Secondary | ICD-10-CM | POA: Diagnosis not present

## 2018-06-23 DIAGNOSIS — Z9221 Personal history of antineoplastic chemotherapy: Secondary | ICD-10-CM | POA: Diagnosis not present

## 2018-06-23 DIAGNOSIS — C7951 Secondary malignant neoplasm of bone: Secondary | ICD-10-CM | POA: Diagnosis not present

## 2018-06-23 DIAGNOSIS — R030 Elevated blood-pressure reading, without diagnosis of hypertension: Secondary | ICD-10-CM | POA: Diagnosis not present

## 2018-06-23 DIAGNOSIS — Z7951 Long term (current) use of inhaled steroids: Secondary | ICD-10-CM | POA: Diagnosis not present

## 2018-06-23 DIAGNOSIS — C61 Malignant neoplasm of prostate: Secondary | ICD-10-CM

## 2018-06-23 DIAGNOSIS — Z9079 Acquired absence of other genital organ(s): Secondary | ICD-10-CM | POA: Insufficient documentation

## 2018-06-23 LAB — CBC WITH DIFFERENTIAL (CANCER CENTER ONLY)
Abs Immature Granulocytes: 0.01 10*3/uL (ref 0.00–0.07)
Basophils Absolute: 0 10*3/uL (ref 0.0–0.1)
Basophils Relative: 1 %
Eosinophils Absolute: 0.1 10*3/uL (ref 0.0–0.5)
Eosinophils Relative: 2 %
HCT: 39.2 % (ref 39.0–52.0)
Hemoglobin: 12.8 g/dL — ABNORMAL LOW (ref 13.0–17.0)
Immature Granulocytes: 0 %
Lymphocytes Relative: 39 %
Lymphs Abs: 2.5 10*3/uL (ref 0.7–4.0)
MCH: 30.5 pg (ref 26.0–34.0)
MCHC: 32.7 g/dL (ref 30.0–36.0)
MCV: 93.6 fL (ref 80.0–100.0)
Monocytes Absolute: 0.6 10*3/uL (ref 0.1–1.0)
Monocytes Relative: 9 %
Neutro Abs: 3.2 10*3/uL (ref 1.7–7.7)
Neutrophils Relative %: 49 %
Platelet Count: 195 10*3/uL (ref 150–400)
RBC: 4.19 MIL/uL — ABNORMAL LOW (ref 4.22–5.81)
RDW: 13.5 % (ref 11.5–15.5)
WBC Count: 6.4 10*3/uL (ref 4.0–10.5)
nRBC: 0 % (ref 0.0–0.2)

## 2018-06-23 LAB — CMP (CANCER CENTER ONLY)
ALT: 15 U/L (ref 0–44)
AST: 20 U/L (ref 15–41)
Albumin: 3.8 g/dL (ref 3.5–5.0)
Alkaline Phosphatase: 60 U/L (ref 38–126)
Anion gap: 10 (ref 5–15)
BUN: 17 mg/dL (ref 8–23)
CO2: 25 mmol/L (ref 22–32)
Calcium: 9.3 mg/dL (ref 8.9–10.3)
Chloride: 105 mmol/L (ref 98–111)
Creatinine: 1.11 mg/dL (ref 0.61–1.24)
GFR, Est AFR Am: >60 mL/min (ref >60)
GFR, Estimated: >60 mL/min (ref >60)
Glucose, Bld: 103 mg/dL — ABNORMAL HIGH (ref 70–99)
Potassium: 3.9 mmol/L (ref 3.5–5.1)
Sodium: 140 mmol/L (ref 135–145)
Total Bilirubin: 0.3 mg/dL (ref 0.3–1.2)
Total Protein: 6.8 g/dL (ref 6.5–8.1)

## 2018-06-23 MED ORDER — HEPARIN SOD (PORK) LOCK FLUSH 100 UNIT/ML IV SOLN
500.0000 [IU] | Freq: Once | INTRAVENOUS | Status: AC
  Administered 2018-06-23: 14:00:00 500 [IU]
  Filled 2018-06-23: qty 5

## 2018-06-23 MED ORDER — SODIUM CHLORIDE 0.9% FLUSH
10.0000 mL | Freq: Once | INTRAVENOUS | Status: AC
  Administered 2018-06-23: 14:00:00 10 mL
  Filled 2018-06-23: qty 10

## 2018-06-24 ENCOUNTER — Other Ambulatory Visit: Payer: Self-pay | Admitting: Internal Medicine

## 2018-06-24 DIAGNOSIS — I10 Essential (primary) hypertension: Secondary | ICD-10-CM

## 2018-06-24 LAB — PROSTATE-SPECIFIC AG, SERUM (LABCORP): Prostate Specific Ag, Serum: 0.1 ng/mL (ref 0.0–4.0)

## 2018-06-25 ENCOUNTER — Inpatient Hospital Stay (HOSPITAL_BASED_OUTPATIENT_CLINIC_OR_DEPARTMENT_OTHER): Payer: Medicare Other | Admitting: Oncology

## 2018-06-25 ENCOUNTER — Other Ambulatory Visit: Payer: Self-pay

## 2018-06-25 VITALS — BP 157/96 | HR 73 | Temp 98.1°F | Resp 18 | Ht 76.0 in | Wt 201.2 lb

## 2018-06-25 DIAGNOSIS — C7951 Secondary malignant neoplasm of bone: Secondary | ICD-10-CM

## 2018-06-25 DIAGNOSIS — Z888 Allergy status to other drugs, medicaments and biological substances status: Secondary | ICD-10-CM

## 2018-06-25 DIAGNOSIS — Z9079 Acquired absence of other genital organ(s): Secondary | ICD-10-CM | POA: Diagnosis not present

## 2018-06-25 DIAGNOSIS — C61 Malignant neoplasm of prostate: Secondary | ICD-10-CM | POA: Diagnosis not present

## 2018-06-25 DIAGNOSIS — Z9221 Personal history of antineoplastic chemotherapy: Secondary | ICD-10-CM

## 2018-06-25 DIAGNOSIS — Z7951 Long term (current) use of inhaled steroids: Secondary | ICD-10-CM | POA: Diagnosis not present

## 2018-06-25 DIAGNOSIS — R030 Elevated blood-pressure reading, without diagnosis of hypertension: Secondary | ICD-10-CM

## 2018-06-25 DIAGNOSIS — Z79899 Other long term (current) drug therapy: Secondary | ICD-10-CM | POA: Diagnosis not present

## 2018-06-25 NOTE — Progress Notes (Signed)
Hematology and Oncology Follow Up Visit  Philip Philip [name] Philip [name] [id number] May 07, 1945 73 y.o. 06/25/2018 3:17 PM Philip Philip [name] Philip [name], MDJones, [name], MD   Principle Diagnosis: 73 year old man with advanced prostate cancer with disease to the bone diagnosed in 2016.  He has castration-resistant after presentation with PSA of 2900.   Prior Therapy: Status post orchiectomy done on November 2016.  Taxotere chemotherapy at 75 mg/m to start on 02/16/2015. He is completed 6 cycles of therapy in March 2017.  Current therapy: Zytiga 1000 mg daily with prednisone at 5 mg daily started on 09/13/2015.  He is no longer on prednisone at this time due to poor tolerance.  Interim History:  Philip Philip [name] Philip [name] is here for a repeat evaluation.  Since the last visit, he reports no changes in his health.  He continues to feel well and reports no recent illnesses or hospitalizations.  He tolerates Zytiga without any issues and has not reported any bone pain or edema.  He reports excellent quality of life and exercises regularly.  His appetite has been excellent and his weight is stable.    He denied headaches, blurry vision, syncope or seizures.  Denies any fevers, chills or sweats.  Denied chest pain, palpitation, orthopnea or leg edema.  Denied cough, wheezing or hemoptysis.  Denied nausea, vomiting or abdominal pain.  Denies any constipation or diarrhea.  Denies any frequency urgency or hesitancy.  Denies any arthralgias or myalgias.  Denies any skin rashes or lesions.  Denies any bleeding or clotting tendency.  Denies any easy bruising.  Denies any hair or nail changes.  Denies any anxiety or depression.  Remaining review of system is negative.       Medications: I have reviewed the patient's current medications.   Current Outpatient Medications  Medication Sig Dispense Refill  . aspirin EC 81 MG tablet Take 81 mg by mouth daily.    Marland Kitchen atorvastatin (LIPITOR) 10 MG tablet TAKE 1 TABLET BY MOUTH EVERY DAY 90 tablet 1  .  azelastine (ASTELIN) 0.1 % nasal spray Place 1 spray into both nostrils 2 (two) times daily. Use in each nostril as directed 90 mL 1  . BIOTIN 5000 PO Take 1 tablet by mouth daily.    . Calcium Carbonate-Vitamin D3 (CALCIUM 600+D3) 600-400 MG-UNIT TABS Take 1 tablet by mouth daily.    . Denosumab (XGEVA [place]) Inject into the skin.    . fexofenadine (ALLEGRA) 180 MG tablet Take 180 mg by mouth daily.    . fluticasone (FLONASE) 50 MCG/ACT nasal spray SPRAY 2 SPRAYS INTO EACH NOSTRIL EVERY DAY 48 g 2  . hydrOXYzine (ATARAX/VISTARIL) 10 MG tablet 1 TABLET BY MOUTH EVERY 8 HOURS AS NEEDED    . Multiple Vitamin (MULTIVITAMIN) tablet Take 1 tablet by mouth daily.    Marland Kitchen olmesartan (BENICAR) 20 MG tablet TAKE 1 TABLET BY MOUTH EVERY DAY 90 tablet 0  . ZYTIGA 250 MG tablet TAKE 4 TABLETS BY MOUTH ONCE DAILY AS DIRECTED.  TAKE 1 HOUR BEFORE OR 2 HOURS AFTER A MEAL 120 tablet 11   No current facility-administered medications for this visit.      Allergies:  Allergies  Allergen Reactions  . Prednisone Anxiety    Past Medical History, Surgical history, Social history, and Family History remain unchanged on review.   Physical Exam:  Blood pressure (!) 157/96, pulse 73, temperature 98.1 F (36.7 C), temperature source Oral, resp. rate 18, height 6\' 4"  (1.93 m), weight 201 lb 3.2 oz (91.3 kg), SpO2 100 %.  ECOG: 0    General appearance: Comfortable appearing without any discomfort Head: Normocephalic without any trauma Oropharynx: Mucous membranes are moist and pink without any thrush or ulcers. Eyes: Pupils are equal and round reactive to light. Lymph nodes: No cervical, supraclavicular, inguinal or axillary lymphadenopathy.   Heart:regular rate and rhythm.  S1 and S2 without leg edema. Lung: Clear without any rhonchi or wheezes.  No dullness to percussion. Abdomin: Soft, nontender, nondistended with good bowel sounds.  No hepatosplenomegaly. Musculoskeletal: No joint deformity or  effusion.  Full range of motion noted. Neurological: No deficits noted on motor, sensory and deep tendon reflex exam. Skin: No petechial rash or dryness.  Appeared moist.  .           Lab Results: Lab Results  Component Value Date   WBC 6.4 06/23/2018   HGB 12.8 (L) 06/23/2018   HCT 39.2 06/23/2018   MCV 93.6 06/23/2018   PLT 195 06/23/2018     Chemistry      Component Value Date/Time   NA 140 06/23/2018 1418   NA 139 02/10/2017 1323   K 3.9 06/23/2018 1418   K 3.4 (L) 02/10/2017 1323   CL 105 06/23/2018 1418   CO2 25 06/23/2018 1418   CO2 23 02/10/2017 1323   BUN 17 06/23/2018 1418   BUN 13.6 02/10/2017 1323   CREATININE 1.11 06/23/2018 1418   CREATININE 0.9 02/10/2017 1323      Component Value Date/Time   CALCIUM 9.3 06/23/2018 1418   CALCIUM 9.2 02/10/2017 1323   ALKPHOS 60 06/23/2018 1418   ALKPHOS 50 02/10/2017 1323   AST 20 06/23/2018 1418   AST 20 02/10/2017 1323   ALT 15 06/23/2018 1418   ALT 15 02/10/2017 1323   BILITOT 0.3 06/23/2018 1418   BILITOT 0.52 02/10/2017 1323         Results for Philip Philip [name], Philip [name] (MRN [id number]) as of 06/25/2018 14:43  Ref. Range 06/23/2018 14:18  Prostate Specific Ag, Serum Latest Ref Range: 0.0 - 4.0 ng/mL 0.1        Impression and Plan:  73 year old man with:  1.  Advanced prostate cancer that is currently castration-resistant diagnosed in 2016.  He has tolerated Zytiga reasonably well without any major complaints.  His PSA continues to decline without any major toxicity.  Risks and benefits of continuing this treatment long-term was reviewed.  Potential complications and include edema and hypertension as well as renal sufficiency was reiterated.  Repeat imaging studies will be done before the end of 2019.  Alternative treatment options would be systemic chemotherapy if he develops progression of disease.  He is agreeable to continue at this time.    2. IV access: Port-A-Cath currently in use without any  complications.  This will be flushed periodically.  3. Hypertension: This will need to be monitored periodically on Zytiga.  Blood pressure is mildly elevated today but has been within normal range between visits.  I asked him to check his blood pressure periodically outside of his clinic visits.  4.  Androgen depravation: Testosterone level remains castrate after bilateral orchiectomy.  We will continue to measure testosterone periodically.  5.  Bone directed therapy: Delton See remains on hold due to dental surgery.  His dental surgery is on hold at this time and will continue to hold Xgeva in the interim.  6.  Prognosis: Therapy remains palliative although his performance status is excellent and aggressive therapy is warranted.  7. Follow-up: In 6 weeks for repeat evaluation.  25  minutes was spent with the patient face-to-face today.  More than 50% of the time was dedicated to reviewing his disease status, laboratory data review, treatment options and assessing complications related to therapy.  Zola Button, MD 4/24/20203:17 PM

## 2018-06-28 ENCOUNTER — Telehealth: Payer: Self-pay | Admitting: Oncology

## 2018-06-28 NOTE — Telephone Encounter (Signed)
Scheduled appt per 4/24 sch message. ° °A calendar will be mailed out. °

## 2018-06-29 ENCOUNTER — Ambulatory Visit: Payer: Medicare Other | Admitting: Internal Medicine

## 2018-06-29 DIAGNOSIS — I10 Essential (primary) hypertension: Secondary | ICD-10-CM

## 2018-06-29 NOTE — Progress Notes (Signed)
Virtual Visit via Video Note  I connected with Philip [name] on 06/29/18 at  2:00 PM EDT by a video enabled telemedicine application and verified that I am speaking with the correct person using two identifiers.   I discussed the limitations of evaluation and management by telemedicine and the availability of in person appointments. The patient expressed understanding and agreed to proceed.  History of Present Illness:  Due to technical difficulties on his side we were not able to connect today for a virtual or telephone encounter.   Observations/Objective:  Lab Results  Component Value Date   WBC 6.4 06/23/2018   HGB 12.8 (L) 06/23/2018   HCT 39.2 06/23/2018   PLT 195 06/23/2018   GLUCOSE 103 (H) 06/23/2018   CHOL 171 03/29/2018   TRIG 234.0 (H) 03/29/2018   HDL 68.80 03/29/2018   LDLDIRECT 64.0 03/29/2018   ALT 15 06/23/2018   AST 20 06/23/2018   NA 140 06/23/2018   K 3.9 06/23/2018   CL 105 06/23/2018   CREATININE 1.11 06/23/2018   BUN 17 06/23/2018   CO2 25 06/23/2018   TSH 1.70 03/29/2018   PSA 0.17 03/29/2018   INR 1.06 02/09/2015     Assessment and Plan:   Follow Up Instructions:    I discussed the assessment and treatment plan with the patient. The patient was provided an opportunity to ask questions and all were answered. The patient agreed with the plan and demonstrated an understanding of the instructions.   The patient was advised to call back or seek an in-person evaluation if the symptoms worsen or if the condition fails to improve as anticipated.  I provided 0 minutes of non-face-to-face time during this encounter.   Scarlette Calico, MD

## 2018-07-13 ENCOUNTER — Other Ambulatory Visit: Payer: Self-pay

## 2018-07-13 MED ORDER — ABIRATERONE ACETATE 250 MG PO TABS: ORAL_TABLET | ORAL | 11 refills | Status: DC

## 2018-07-19 DIAGNOSIS — C61 Malignant neoplasm of prostate: Secondary | ICD-10-CM | POA: Diagnosis not present

## 2018-07-19 DIAGNOSIS — C775 Secondary and unspecified malignant neoplasm of intrapelvic lymph nodes: Secondary | ICD-10-CM | POA: Diagnosis not present

## 2018-07-19 DIAGNOSIS — N1330 Unspecified hydronephrosis: Secondary | ICD-10-CM | POA: Diagnosis not present

## 2018-07-19 DIAGNOSIS — C7951 Secondary malignant neoplasm of bone: Secondary | ICD-10-CM | POA: Diagnosis not present

## 2018-07-27 ENCOUNTER — Other Ambulatory Visit: Payer: Self-pay | Admitting: Internal Medicine

## 2018-07-27 DIAGNOSIS — I10 Essential (primary) hypertension: Secondary | ICD-10-CM

## 2018-07-27 MED ORDER — OLMESARTAN MEDOXOMIL 20 MG PO TABS: 20.0000 mg | ORAL_TABLET | Freq: Every day | ORAL | 0 refills | Status: DC

## 2018-08-03 ENCOUNTER — Inpatient Hospital Stay: Payer: Medicare Other | Attending: Oncology

## 2018-08-03 ENCOUNTER — Other Ambulatory Visit: Payer: Self-pay

## 2018-08-03 ENCOUNTER — Inpatient Hospital Stay: Payer: Medicare Other

## 2018-08-03 DIAGNOSIS — C61 Malignant neoplasm of prostate: Secondary | ICD-10-CM

## 2018-08-03 DIAGNOSIS — Z7982 Long term (current) use of aspirin: Secondary | ICD-10-CM | POA: Diagnosis not present

## 2018-08-03 DIAGNOSIS — C7951 Secondary malignant neoplasm of bone: Secondary | ICD-10-CM | POA: Diagnosis not present

## 2018-08-03 DIAGNOSIS — Z452 Encounter for adjustment and management of vascular access device: Secondary | ICD-10-CM | POA: Diagnosis not present

## 2018-08-03 DIAGNOSIS — I10 Essential (primary) hypertension: Secondary | ICD-10-CM | POA: Diagnosis not present

## 2018-08-03 DIAGNOSIS — Z79899 Other long term (current) drug therapy: Secondary | ICD-10-CM | POA: Insufficient documentation

## 2018-08-03 DIAGNOSIS — Z95828 Presence of other vascular implants and grafts: Secondary | ICD-10-CM

## 2018-08-03 DIAGNOSIS — Z9079 Acquired absence of other genital organ(s): Secondary | ICD-10-CM | POA: Insufficient documentation

## 2018-08-03 DIAGNOSIS — Z7951 Long term (current) use of inhaled steroids: Secondary | ICD-10-CM | POA: Insufficient documentation

## 2018-08-03 LAB — CBC WITH DIFFERENTIAL (CANCER CENTER ONLY)
Abs Immature Granulocytes: 0.01 10*3/uL (ref 0.00–0.07)
Basophils Absolute: 0 10*3/uL (ref 0.0–0.1)
Basophils Relative: 0 %
Eosinophils Absolute: 0.1 10*3/uL (ref 0.0–0.5)
Eosinophils Relative: 2 %
HCT: 38.6 % — ABNORMAL LOW (ref 39.0–52.0)
Hemoglobin: 12.6 g/dL — ABNORMAL LOW (ref 13.0–17.0)
Immature Granulocytes: 0 %
Lymphocytes Relative: 36 %
Lymphs Abs: 2.2 10*3/uL (ref 0.7–4.0)
MCH: 30.8 pg (ref 26.0–34.0)
MCHC: 32.6 g/dL (ref 30.0–36.0)
MCV: 94.4 fL (ref 80.0–100.0)
Monocytes Absolute: 0.5 10*3/uL (ref 0.1–1.0)
Monocytes Relative: 9 %
Neutro Abs: 3.1 10*3/uL (ref 1.7–7.7)
Neutrophils Relative %: 53 %
Platelet Count: 208 10*3/uL (ref 150–400)
RBC: 4.09 MIL/uL — ABNORMAL LOW (ref 4.22–5.81)
RDW: 13.3 % (ref 11.5–15.5)
WBC Count: 5.9 10*3/uL (ref 4.0–10.5)
nRBC: 0 % (ref 0.0–0.2)

## 2018-08-03 LAB — CMP (CANCER CENTER ONLY)
ALT: 14 U/L (ref 0–44)
AST: 20 U/L (ref 15–41)
Albumin: 3.7 g/dL (ref 3.5–5.0)
Alkaline Phosphatase: 59 U/L (ref 38–126)
Anion gap: 7 (ref 5–15)
BUN: 15 mg/dL (ref 8–23)
CO2: 27 mmol/L (ref 22–32)
Calcium: 9.5 mg/dL (ref 8.9–10.3)
Chloride: 106 mmol/L (ref 98–111)
Creatinine: 0.94 mg/dL (ref 0.61–1.24)
GFR, Est AFR Am: >60 mL/min (ref >60)
GFR, Estimated: >60 mL/min (ref >60)
Glucose, Bld: 118 mg/dL — ABNORMAL HIGH (ref 70–99)
Potassium: 3.8 mmol/L (ref 3.5–5.1)
Sodium: 140 mmol/L (ref 135–145)
Total Bilirubin: 0.4 mg/dL (ref 0.3–1.2)
Total Protein: 6.5 g/dL (ref 6.5–8.1)

## 2018-08-03 MED ORDER — SODIUM CHLORIDE 0.9% FLUSH
10.0000 mL | Freq: Once | INTRAVENOUS | Status: AC
  Administered 2018-08-03: 10 mL
  Filled 2018-08-03: qty 10

## 2018-08-03 MED ORDER — HEPARIN SOD (PORK) LOCK FLUSH 100 UNIT/ML IV SOLN
500.0000 [IU] | Freq: Once | INTRAVENOUS | Status: AC
  Administered 2018-08-03: 500 [IU]
  Filled 2018-08-03: qty 5

## 2018-08-04 LAB — PROSTATE-SPECIFIC AG, SERUM (LABCORP): Prostate Specific Ag, Serum: 0.1 ng/mL (ref 0.0–4.0)

## 2018-08-05 ENCOUNTER — Inpatient Hospital Stay (HOSPITAL_BASED_OUTPATIENT_CLINIC_OR_DEPARTMENT_OTHER): Payer: Medicare Other | Admitting: Oncology

## 2018-08-05 ENCOUNTER — Other Ambulatory Visit: Payer: Self-pay

## 2018-08-05 VITALS — BP 171/89 | HR 78 | Temp 98.5°F | Resp 18 | Ht 76.0 in | Wt 201.4 lb

## 2018-08-05 DIAGNOSIS — Z9079 Acquired absence of other genital organ(s): Secondary | ICD-10-CM | POA: Diagnosis not present

## 2018-08-05 DIAGNOSIS — C61 Malignant neoplasm of prostate: Secondary | ICD-10-CM | POA: Diagnosis not present

## 2018-08-05 DIAGNOSIS — I10 Essential (primary) hypertension: Secondary | ICD-10-CM | POA: Diagnosis not present

## 2018-08-05 DIAGNOSIS — Z7951 Long term (current) use of inhaled steroids: Secondary | ICD-10-CM | POA: Diagnosis not present

## 2018-08-05 DIAGNOSIS — Z79899 Other long term (current) drug therapy: Secondary | ICD-10-CM

## 2018-08-05 DIAGNOSIS — C7951 Secondary malignant neoplasm of bone: Secondary | ICD-10-CM | POA: Diagnosis not present

## 2018-08-05 DIAGNOSIS — Z452 Encounter for adjustment and management of vascular access device: Secondary | ICD-10-CM | POA: Diagnosis not present

## 2018-08-05 DIAGNOSIS — Z7982 Long term (current) use of aspirin: Secondary | ICD-10-CM | POA: Diagnosis not present

## 2018-08-05 NOTE — Progress Notes (Signed)
Hematology and Oncology Follow Up Visit  Philip [name] [id number] [birth year] 73 y.o. 08/05/2018 3:01 PM [name], MDJones, [name], MD   Principle Diagnosis: 73 year old man with castration-resistant prostate cancer with disease to the bone diagnosed in 2016.  He was found to have PSA of 2900 the time of diagnosis.  900.   Prior Therapy: Status post orchiectomy done on November 2016.  Taxotere chemotherapy at 75 mg/m to start on 02/16/2015. He is completed 6 cycles of therapy in March 2017.  Current therapy: Zytiga 1000 mg daily with prednisone at 5 mg daily started on 09/13/2015.  Prednisone was discontinued because of toxicities.  Interim History:  Mr. Marxen is here for a follow-up visit.  Since the last visit, he reports no major changes in his health.  He continues to tolerate Zytiga without any side effects.  He denied any recent hospitalizations or illnesses.  He denies any abdominal pain or edema.  He does check his blood pressure periodically and has been close to normal range.  His performance status quality of life remains unchanged.  Continues to work full-time and exercise regularly.   He denied any alteration mental status, neuropathy, confusion or dizziness.  Denies any headaches or lethargy.  Denies any night sweats, weight loss or changes in appetite.  Denied orthopnea, dyspnea on exertion or chest discomfort.  Denies shortness of breath, difficulty breathing hemoptysis or cough.  Denies any abdominal distention, nausea, early satiety or dyspepsia.  Denies any hematuria, frequency, dysuria or nocturia.  Denies any skin irritation, dryness or rash.  Denies any ecchymosis or petechiae.  Denies any lymphadenopathy or clotting.  Denies any heat or cold intolerance.  Denies any anxiety or depression.  Remaining review of system is negative.        Medications: I have reviewed the patient's current medications.   Current Outpatient Medications  Medication Sig Dispense Refill   . abiraterone acetate (ZYTIGA) 250 MG tablet TAKE 4 TABLETS BY MOUTH ONCE DAILY AS DIRECTED.  TAKE 1 HOUR BEFORE OR 2 HOURS AFTER A MEAL 120 tablet 11  . aspirin EC 81 MG tablet Take 81 mg by mouth daily.    Marland Kitchen atorvastatin (LIPITOR) 10 MG tablet TAKE 1 TABLET BY MOUTH EVERY DAY 90 tablet 1  . azelastine (ASTELIN) 0.1 % nasal spray Place 1 spray into both nostrils 2 (two) times daily. Use in each nostril as directed 90 mL 1  . BIOTIN 5000 PO Take 1 tablet by mouth daily.    . Calcium Carbonate-Vitamin D3 (CALCIUM 600+D3) 600-400 MG-UNIT TABS Take 1 tablet by mouth daily.    . Denosumab (XGEVA [place]) Inject into the skin.    . fexofenadine (ALLEGRA) 180 MG tablet Take 180 mg by mouth daily.    . fluticasone (FLONASE) 50 MCG/ACT nasal spray SPRAY 2 SPRAYS INTO EACH NOSTRIL EVERY DAY 48 g 2  . hydrOXYzine (ATARAX/VISTARIL) 10 MG tablet 1 TABLET BY MOUTH EVERY 8 HOURS AS NEEDED    . Multiple Vitamin (MULTIVITAMIN) tablet Take 1 tablet by mouth daily.    Marland Kitchen olmesartan (BENICAR) 20 MG tablet Take 1 tablet (20 mg total) by mouth daily. 90 tablet 0   No current facility-administered medications for this visit.      Allergies:  Allergies  Allergen Reactions  . Prednisone Anxiety    Past Medical History, Surgical history, Social history, and Family History remain unchanged on review.   Physical Exam:   Blood pressure (!) 171/89, pulse 78, temperature 98.5 F (36.9  C), temperature source Oral, resp. rate 18, height 6\' 4"  (1.93 m), weight 201 lb 6.4 oz (91.4 kg), SpO2 100 %.     ECOG: 0     General appearance: Alert, awake without any distress. Head: Atraumatic without abnormalities Oropharynx: Without any thrush or ulcers. Eyes: No scleral icterus. Lymph nodes: No lymphadenopathy noted in the cervical, supraclavicular, or axillary nodes Heart:regular rate and rhythm, without any murmurs or gallops.   Lung: Clear to auscultation without any rhonchi, wheezes or dullness to  percussion. Abdomin: Soft, nontender without any shifting dullness or ascites. Musculoskeletal: No clubbing or cyanosis. Neurological: No motor or sensory deficits. Skin: No rashes or lesions.            Lab Results: Lab Results  Component Value Date   WBC 5.9 08/03/2018   HGB 12.6 (L) 08/03/2018   HCT 38.6 (L) 08/03/2018   MCV 94.4 08/03/2018   PLT 208 08/03/2018     Chemistry      Component Value Date/Time   NA 140 08/03/2018 1339   NA 139 02/10/2017 1323   K 3.8 08/03/2018 1339   K 3.4 (L) 02/10/2017 1323   CL 106 08/03/2018 1339   CO2 27 08/03/2018 1339   CO2 23 02/10/2017 1323   BUN 15 08/03/2018 1339   BUN 13.6 02/10/2017 1323   CREATININE 0.94 08/03/2018 1339   CREATININE 0.9 02/10/2017 1323      Component Value Date/Time   CALCIUM 9.5 08/03/2018 1339   CALCIUM 9.2 02/10/2017 1323   ALKPHOS 59 08/03/2018 1339   ALKPHOS 50 02/10/2017 1323   AST 20 08/03/2018 1339   AST 20 02/10/2017 1323   ALT 14 08/03/2018 1339   ALT 15 02/10/2017 1323   BILITOT 0.4 08/03/2018 1339   BILITOT 0.52 02/10/2017 1323        Results for SENCERE, SYMONETTE (MRN [id number]) as of 08/05/2018 15:03  Ref. Range 06/23/2018 14:18 08/03/2018 13:39  Prostate Specific Ag, Serum Latest Ref Range: 0.0 - 4.0 ng/mL 0.1 0.1          Impression and Plan:  73 year old man with:  1.  Castration-resistant prostate cancer with disease to the bone documented in 2016.   He continues to tolerate Zytiga without any major complications.  His PSA continues to show response to therapy.  Risks and benefits of continuing this therapy long-term was reviewed today.  Potential complications were reiterated which include hypertension, edema and adrenal insufficiency.  Different salvage therapy may be needed if he develops progression of disease in the future.  These options would include repeat systemic chemotherapy among others.    2. IV access: Port-A-Cath remains in use without any issues.  We  will continue to be flushed with every visit.  3. Hypertension: Blood pressure is mildly elevated today but has been normal outside of his visits.  Continue to ask him to monitor his blood pressure periodically.  4.  Androgen depravation: He is status post orchiectomy without any need for any additional androgen deprivation.  5.  Bone directed therapy: He has been on Xgeva in the past and has been withheld because of dental surgery.  This will not be resumed until he is recovered from dental surgery probably around October 2020.  6.  Prognosis: His disease is incurable although aggressive therapy is warranted given his excellent performance status.  7. Follow-up: 6 to 8 weeks.  25 minutes was spent with the patient face-to-face today.  More than 50% of the time was spent on  reviewing his disease status, treatment options, complications related therapy and future alternative treatments.  Zola Button, MD 6/4/20203:01 PM

## 2018-08-10 ENCOUNTER — Other Ambulatory Visit: Payer: Self-pay | Admitting: Oncology

## 2018-08-10 MED ORDER — LORAZEPAM 1 MG PO TABS: 1.0000 mg | ORAL_TABLET | Freq: Three times a day (TID) | ORAL | 0 refills | Status: DC | PRN

## 2018-08-25 ENCOUNTER — Telehealth: Payer: Self-pay | Admitting: Oncology

## 2018-08-25 NOTE — Telephone Encounter (Signed)
Scheduled appt per 6/23 sch message - pt is aware of appt date and time   

## 2018-09-23 ENCOUNTER — Other Ambulatory Visit: Payer: Self-pay

## 2018-09-23 ENCOUNTER — Telehealth: Payer: Self-pay | Admitting: Oncology

## 2018-09-23 ENCOUNTER — Inpatient Hospital Stay: Payer: Medicare Other | Attending: Oncology | Admitting: Oncology

## 2018-09-23 ENCOUNTER — Inpatient Hospital Stay: Payer: Medicare Other

## 2018-09-23 VITALS — BP 165/83 | HR 71 | Temp 98.5°F | Resp 17 | Ht 76.0 in | Wt 195.3 lb

## 2018-09-23 DIAGNOSIS — C7951 Secondary malignant neoplasm of bone: Secondary | ICD-10-CM | POA: Diagnosis not present

## 2018-09-23 DIAGNOSIS — C61 Malignant neoplasm of prostate: Secondary | ICD-10-CM | POA: Insufficient documentation

## 2018-09-23 DIAGNOSIS — Z79899 Other long term (current) drug therapy: Secondary | ICD-10-CM | POA: Diagnosis not present

## 2018-09-23 DIAGNOSIS — Z452 Encounter for adjustment and management of vascular access device: Secondary | ICD-10-CM | POA: Diagnosis not present

## 2018-09-23 DIAGNOSIS — I10 Essential (primary) hypertension: Secondary | ICD-10-CM | POA: Insufficient documentation

## 2018-09-23 DIAGNOSIS — Z95828 Presence of other vascular implants and grafts: Secondary | ICD-10-CM

## 2018-09-23 DIAGNOSIS — Z9079 Acquired absence of other genital organ(s): Secondary | ICD-10-CM | POA: Diagnosis not present

## 2018-09-23 DIAGNOSIS — Z7982 Long term (current) use of aspirin: Secondary | ICD-10-CM | POA: Insufficient documentation

## 2018-09-23 LAB — CBC WITH DIFFERENTIAL (CANCER CENTER ONLY)
Abs Immature Granulocytes: 0.01 10*3/uL (ref 0.00–0.07)
Basophils Absolute: 0 10*3/uL (ref 0.0–0.1)
Basophils Relative: 0 %
Eosinophils Absolute: 0.1 10*3/uL (ref 0.0–0.5)
Eosinophils Relative: 2 %
HCT: 36.7 % — ABNORMAL LOW (ref 39.0–52.0)
Hemoglobin: 12.2 g/dL — ABNORMAL LOW (ref 13.0–17.0)
Immature Granulocytes: 0 %
Lymphocytes Relative: 36 %
Lymphs Abs: 1.9 10*3/uL (ref 0.7–4.0)
MCH: 31.2 pg (ref 26.0–34.0)
MCHC: 33.2 g/dL (ref 30.0–36.0)
MCV: 93.9 fL (ref 80.0–100.0)
Monocytes Absolute: 0.5 10*3/uL (ref 0.1–1.0)
Monocytes Relative: 9 %
Neutro Abs: 2.9 10*3/uL (ref 1.7–7.7)
Neutrophils Relative %: 53 %
Platelet Count: 177 10*3/uL (ref 150–400)
RBC: 3.91 MIL/uL — ABNORMAL LOW (ref 4.22–5.81)
RDW: 13.2 % (ref 11.5–15.5)
WBC Count: 5.4 10*3/uL (ref 4.0–10.5)
nRBC: 0 % (ref 0.0–0.2)

## 2018-09-23 LAB — CMP (CANCER CENTER ONLY)
ALT: 13 U/L (ref 0–44)
AST: 18 U/L (ref 15–41)
Albumin: 3.7 g/dL (ref 3.5–5.0)
Alkaline Phosphatase: 85 U/L (ref 38–126)
Anion gap: 8 (ref 5–15)
BUN: 22 mg/dL (ref 8–23)
CO2: 26 mmol/L (ref 22–32)
Calcium: 9.2 mg/dL (ref 8.9–10.3)
Chloride: 108 mmol/L (ref 98–111)
Creatinine: 0.93 mg/dL (ref 0.61–1.24)
GFR, Est AFR Am: >60 mL/min (ref >60)
GFR, Estimated: >60 mL/min (ref >60)
Glucose, Bld: 123 mg/dL — ABNORMAL HIGH (ref 70–99)
Potassium: 3.5 mmol/L (ref 3.5–5.1)
Sodium: 142 mmol/L (ref 135–145)
Total Bilirubin: 0.3 mg/dL (ref 0.3–1.2)
Total Protein: 6.5 g/dL (ref 6.5–8.1)

## 2018-09-23 LAB — PSA: PSA: 0.1

## 2018-09-23 MED ORDER — HEPARIN SOD (PORK) LOCK FLUSH 100 UNIT/ML IV SOLN
500.0000 [IU] | Freq: Once | INTRAVENOUS | Status: AC
  Administered 2018-09-23: 15:00:00 500 [IU]
  Filled 2018-09-23: qty 5

## 2018-09-23 MED ORDER — SODIUM CHLORIDE 0.9% FLUSH
10.0000 mL | Freq: Once | INTRAVENOUS | Status: AC
  Administered 2018-09-23: 10 mL
  Filled 2018-09-23: qty 10

## 2018-09-23 NOTE — Patient Instructions (Signed)

## 2018-09-23 NOTE — Telephone Encounter (Signed)
Scheduled appt 7/23 los.  Printed calendar and avs.

## 2018-09-23 NOTE — Progress Notes (Signed)
Hematology and Oncology Follow Up Visit  Philip [name] [id number] Oct 09, 1945 73 y.o. 09/23/2018 3:25 PM [name], MDJones, [name], MD   Principle Diagnosis: 73 year old man with advanced prostate cancer with disease to the bone that is currently castration-resistant.  He presented with PSA of 2900 and 2016.  Prior Therapy: Status post orchiectomy done on November 2016.  Taxotere chemotherapy at 75 mg/m to start on 02/16/2015. He is completed 6 cycles of therapy in March 2017.  Current therapy: Zytiga 1000 mg daily with prednisone at 5 mg daily started on 09/13/2015.  He is currently on Zytiga alone.  Interim History:  Philip [name] returns today for a repeat evaluation.  Since the last visit, he reports no major changes in his health.  He continues to tolerate Zytiga without any new complications.  He continues to be active and exercises regularly including walking 3 to 4 miles a day.  He is eating healthier and have intentionally lost weight.  He denies any bone pain or pathological fractures.  He denies any hospitalization or illnesses.  Patient denied headaches, blurry vision, syncope or seizures.  Denies any fevers, chills or sweats.  Denied chest pain, palpitation, orthopnea or leg edema.  Denied cough, wheezing or hemoptysis.  Denied nausea, vomiting or abdominal pain.  Denies any constipation or diarrhea.  Denies any frequency urgency or hesitancy.  Denies any arthralgias or myalgias.  Denies any skin rashes or lesions.  Denies any bleeding or clotting tendency.  Denies any easy bruising.  Denies any hair or nail changes.  Denies any anxiety or depression.  Remaining review of system is negative.          Medications: No changes in his medication on review. Current Outpatient Medications  Medication Sig Dispense Refill  . abiraterone acetate (ZYTIGA) 250 MG tablet TAKE 4 TABLETS BY MOUTH ONCE DAILY AS DIRECTED.  TAKE 1 HOUR BEFORE OR 2 HOURS AFTER A MEAL 120 tablet 11  .  aspirin EC 81 MG tablet Take 81 mg by mouth daily.    Marland Kitchen atorvastatin (LIPITOR) 10 MG tablet TAKE 1 TABLET BY MOUTH EVERY DAY 90 tablet 1  . azelastine (ASTELIN) 0.1 % nasal spray Place 1 spray into both nostrils 2 (two) times daily. Use in each nostril as directed 90 mL 1  . BIOTIN 5000 PO Take 1 tablet by mouth daily.    . Calcium Carbonate-Vitamin D3 (CALCIUM 600+D3) 600-400 MG-UNIT TABS Take 1 tablet by mouth daily.    . Denosumab (XGEVA [place]) Inject into the skin.    . fexofenadine (ALLEGRA) 180 MG tablet Take 180 mg by mouth daily.    . fluticasone (FLONASE) 50 MCG/ACT nasal spray SPRAY 2 SPRAYS INTO EACH NOSTRIL EVERY DAY 48 g 2  . hydrOXYzine (ATARAX/VISTARIL) 10 MG tablet 1 TABLET BY MOUTH EVERY 8 HOURS AS NEEDED    . LORazepam (ATIVAN) 1 MG tablet Take 1 tablet (1 mg total) by mouth every 8 (eight) hours as needed for anxiety. 30 tablet 0  . Multiple Vitamin (MULTIVITAMIN) tablet Take 1 tablet by mouth daily.    Marland Kitchen olmesartan (BENICAR) 20 MG tablet Take 1 tablet (20 mg total) by mouth daily. 90 tablet 0   No current facility-administered medications for this visit.      Allergies:  Allergies  Allergen Reactions  . Prednisone Anxiety    Past Medical History, Surgical history, Social history, and Family History reviewed and unchanged.   Physical Exam:   Blood pressure (!) 165/83, pulse 71,  temperature 98.5 F (36.9 C), temperature source Oral, resp. rate 17, height 6\' 4"  (1.93 m), weight 195 lb 4.8 oz (88.6 kg), SpO2 96 %.      ECOG: 0      General appearance: Comfortable appearing without any discomfort Head: Normocephalic without any trauma Oropharynx: Mucous membranes are moist and pink without any thrush or ulcers. Eyes: Pupils are equal and round reactive to light. Lymph nodes: No cervical, supraclavicular, inguinal or axillary lymphadenopathy.   Heart:regular rate and rhythm.  S1 and S2 without leg edema. Lung: Clear without any rhonchi or wheezes.  No  dullness to percussion. Abdomin: Soft, nontender, nondistended with good bowel sounds.  No hepatosplenomegaly. Musculoskeletal: No joint deformity or effusion.  Full range of motion noted. Neurological: No deficits noted on motor, sensory and deep tendon reflex exam. Skin: No petechial rash or dryness.  Appeared moist.  Psychiatric: Mood and affect appeared appropriate.            Lab Results: Lab Results  Component Value Date   WBC 5.9 08/03/2018   HGB 12.6 (L) 08/03/2018   HCT 38.6 (L) 08/03/2018   MCV 94.4 08/03/2018   PLT 208 08/03/2018     Chemistry      Component Value Date/Time   NA 140 08/03/2018 1339   NA 139 02/10/2017 1323   K 3.8 08/03/2018 1339   K 3.4 (L) 02/10/2017 1323   CL 106 08/03/2018 1339   CO2 27 08/03/2018 1339   CO2 23 02/10/2017 1323   BUN 15 08/03/2018 1339   BUN 13.6 02/10/2017 1323   CREATININE 0.94 08/03/2018 1339   CREATININE 0.9 02/10/2017 1323      Component Value Date/Time   CALCIUM 9.5 08/03/2018 1339   CALCIUM 9.2 02/10/2017 1323   ALKPHOS 59 08/03/2018 1339   ALKPHOS 50 02/10/2017 1323   AST 20 08/03/2018 1339   AST 20 02/10/2017 1323   ALT 14 08/03/2018 1339   ALT 15 02/10/2017 1323   BILITOT 0.4 08/03/2018 1339   BILITOT 0.52 02/10/2017 1323        Results for Philip [name], Philip [name] (MRN [id number]) as of 08/05/2018 15:03  Ref. Range 06/23/2018 14:18 08/03/2018 13:39  Prostate Specific Ag, Serum Latest Ref Range: 0.0 - 4.0 ng/mL 0.1 0.1          Impression and Plan:  73 year old man with:  1.  Advanced prostate cancer with disease to the bone diagnosed in 2016.  He developed castration-resistant and remains on Zytiga.  The natural course of this disease was updated today and risks and benefits of continuing Zytiga was reviewed.  His PSA continues to be very low with near complete response by imaging studies.  After discussion today he is agreeable to continue with Zytiga.  Hypertension, edema, adrenal insufficiency are  all associated with long-term Zytiga therapy and were reiterated again.    2. IV access: Port-A-Cath will be flushed periodically and remains in place for the time being.  3. Hypertension: Blood pressure is mildly elevated today but within normal range between visits.  I asked him to continue to monitor his blood pressure on Zytiga.  4.  Androgen depravation: No additional androgen deprivation is needed he is status post orchiectomy.  5.  Bone directed therapy: Delton See has been on hold because of dental surgery that is anticipated.  6.  Prognosis: Therapy remains palliative although aggressive measures are warranted given his reasonable performance status.  7. Follow-up: In 6 weeks for repeat evaluation.  25 minutes was  spent with the patient face-to-face today.  More than 50% of the time was dedicated to reviewing his disease status, treatment options and complications related to therapy.Zola Button, MD 7/23/20203:25 PM

## 2018-09-24 ENCOUNTER — Telehealth: Payer: Self-pay

## 2018-09-24 LAB — PROSTATE-SPECIFIC AG, SERUM (LABCORP): Prostate Specific Ag, Serum: 0.1 ng/mL (ref 0.0–4.0)

## 2018-09-24 NOTE — Telephone Encounter (Signed)
-----   Message from [name], MD sent at 09/24/2018  8:23 AM EDT ----- Please let him know his PSA still low.

## 2018-09-24 NOTE — Telephone Encounter (Signed)
Called patient and let him know that per Dr. Alen Blew, PSA is still low. Verbalized understanding.

## 2018-09-26 ENCOUNTER — Other Ambulatory Visit: Payer: Self-pay | Admitting: Internal Medicine

## 2018-09-26 DIAGNOSIS — J301 Allergic rhinitis due to pollen: Secondary | ICD-10-CM

## 2018-10-05 ENCOUNTER — Encounter: Payer: Self-pay | Admitting: Internal Medicine

## 2018-10-05 ENCOUNTER — Other Ambulatory Visit: Payer: Self-pay | Admitting: Internal Medicine

## 2018-10-07 ENCOUNTER — Other Ambulatory Visit: Payer: Self-pay | Admitting: Internal Medicine

## 2018-10-07 DIAGNOSIS — I700 Atherosclerosis of aorta: Secondary | ICD-10-CM

## 2018-10-07 DIAGNOSIS — E785 Hyperlipidemia, unspecified: Secondary | ICD-10-CM

## 2018-10-07 MED ORDER — ATORVASTATIN CALCIUM 10 MG PO TABS: 10.0000 mg | ORAL_TABLET | Freq: Every day | ORAL | 1 refills | Status: DC

## 2018-10-15 ENCOUNTER — Telehealth: Payer: Self-pay

## 2018-10-15 NOTE — Telephone Encounter (Signed)
Oral Oncology Patient Advocate Encounter  The patient called and stated that Philip [name] and Philip [name] mailed him a renewal application that he has filled out. He will bring it in on 8/19 along with his income document and I will send the completed application to [facility] and [place].  This encounter will be updated until final determination.  [place] Patient [place] Phone [phone] Fax [phone] 10/15/2018   2:29 PM

## 2018-10-20 ENCOUNTER — Other Ambulatory Visit: Payer: Self-pay | Admitting: Internal Medicine

## 2018-10-20 DIAGNOSIS — I10 Essential (primary) hypertension: Secondary | ICD-10-CM

## 2018-10-20 NOTE — Telephone Encounter (Signed)
Oral Oncology Patient Advocate Encounter  Met patient in [place] to get application for Wyn[facility] Emery and [facility] in an effort to reduce patient's out of pocket expense for Zytiga to $0.    Application completed and faxed to [phone].   [employer] patient assistance phone number for follow up is [phone].   This encounter will be updated until final determination.  [place] Patient [place] Phone [phone] Fax [phone] 10/20/2018    9:42 AM

## 2018-10-22 NOTE — Telephone Encounter (Signed)
Oral Oncology Patient Advocate Encounter  Received notification from [place] and [place] Patient Assistance program that patient has been successfully enrolled into their program to receive Zytiga from the manufacturer at $0 out of pocket until 10/22/19.    I called and left a voicemail for the patient.  He knows we will have to re-apply.   Patient knows to call the office with questions or concerns.   Oral Oncology Clinic will continue to follow.  [place] Patient [place] Phone [phone] Fax [phone] 10/22/2018    12:01 PM

## 2018-11-09 ENCOUNTER — Other Ambulatory Visit: Payer: Self-pay

## 2018-11-09 ENCOUNTER — Inpatient Hospital Stay: Payer: Medicare Other | Attending: Oncology

## 2018-11-09 ENCOUNTER — Inpatient Hospital Stay: Payer: Medicare Other

## 2018-11-09 DIAGNOSIS — Z9221 Personal history of antineoplastic chemotherapy: Secondary | ICD-10-CM | POA: Insufficient documentation

## 2018-11-09 DIAGNOSIS — Z452 Encounter for adjustment and management of vascular access device: Secondary | ICD-10-CM | POA: Insufficient documentation

## 2018-11-09 DIAGNOSIS — C61 Malignant neoplasm of prostate: Secondary | ICD-10-CM | POA: Diagnosis not present

## 2018-11-09 DIAGNOSIS — I10 Essential (primary) hypertension: Secondary | ICD-10-CM | POA: Diagnosis not present

## 2018-11-09 DIAGNOSIS — Z9079 Acquired absence of other genital organ(s): Secondary | ICD-10-CM | POA: Diagnosis not present

## 2018-11-09 DIAGNOSIS — C779 Secondary and unspecified malignant neoplasm of lymph node, unspecified: Secondary | ICD-10-CM | POA: Insufficient documentation

## 2018-11-09 DIAGNOSIS — Z7982 Long term (current) use of aspirin: Secondary | ICD-10-CM | POA: Insufficient documentation

## 2018-11-09 DIAGNOSIS — Z79899 Other long term (current) drug therapy: Secondary | ICD-10-CM | POA: Insufficient documentation

## 2018-11-09 DIAGNOSIS — C7951 Secondary malignant neoplasm of bone: Secondary | ICD-10-CM | POA: Diagnosis not present

## 2018-11-09 DIAGNOSIS — Z95828 Presence of other vascular implants and grafts: Secondary | ICD-10-CM

## 2018-11-09 LAB — CMP (CANCER CENTER ONLY)
ALT: 15 U/L (ref 0–44)
AST: 21 U/L (ref 15–41)
Albumin: 4 g/dL (ref 3.5–5.0)
Alkaline Phosphatase: 112 U/L (ref 38–126)
Anion gap: 7 (ref 5–15)
BUN: 17 mg/dL (ref 8–23)
CO2: 28 mmol/L (ref 22–32)
Calcium: 9.7 mg/dL (ref 8.9–10.3)
Chloride: 106 mmol/L (ref 98–111)
Creatinine: 1.05 mg/dL (ref 0.61–1.24)
GFR, Est AFR Am: >60 mL/min (ref >60)
GFR, Estimated: >60 mL/min (ref >60)
Glucose, Bld: 96 mg/dL (ref 70–99)
Potassium: 3.6 mmol/L (ref 3.5–5.1)
Sodium: 141 mmol/L (ref 135–145)
Total Bilirubin: 0.4 mg/dL (ref 0.3–1.2)
Total Protein: 6.5 g/dL (ref 6.5–8.1)

## 2018-11-09 LAB — CBC WITH DIFFERENTIAL (CANCER CENTER ONLY)
Abs Immature Granulocytes: 0.01 10*3/uL (ref 0.00–0.07)
Basophils Absolute: 0 10*3/uL (ref 0.0–0.1)
Basophils Relative: 1 %
Eosinophils Absolute: 0.1 10*3/uL (ref 0.0–0.5)
Eosinophils Relative: 2 %
HCT: 35.1 % — ABNORMAL LOW (ref 39.0–52.0)
Hemoglobin: 11.7 g/dL — ABNORMAL LOW (ref 13.0–17.0)
Immature Granulocytes: 0 %
Lymphocytes Relative: 38 %
Lymphs Abs: 2.4 10*3/uL (ref 0.7–4.0)
MCH: 31.2 pg (ref 26.0–34.0)
MCHC: 33.3 g/dL (ref 30.0–36.0)
MCV: 93.6 fL (ref 80.0–100.0)
Monocytes Absolute: 0.5 10*3/uL (ref 0.1–1.0)
Monocytes Relative: 9 %
Neutro Abs: 3.2 10*3/uL (ref 1.7–7.7)
Neutrophils Relative %: 50 %
Platelet Count: 180 10*3/uL (ref 150–400)
RBC: 3.75 MIL/uL — ABNORMAL LOW (ref 4.22–5.81)
RDW: 13.2 % (ref 11.5–15.5)
WBC Count: 6.3 10*3/uL (ref 4.0–10.5)
nRBC: 0 % (ref 0.0–0.2)

## 2018-11-09 MED ORDER — HEPARIN SOD (PORK) LOCK FLUSH 100 UNIT/ML IV SOLN
500.0000 [IU] | Freq: Once | INTRAVENOUS | Status: AC
  Administered 2018-11-09: 500 [IU]
  Filled 2018-11-09: qty 5

## 2018-11-09 MED ORDER — SODIUM CHLORIDE 0.9% FLUSH
10.0000 mL | Freq: Once | INTRAVENOUS | Status: AC
  Administered 2018-11-09: 13:00:00 10 mL
  Filled 2018-11-09: qty 10

## 2018-11-09 NOTE — Patient Instructions (Signed)

## 2018-11-10 LAB — PROSTATE-SPECIFIC AG, SERUM (LABCORP): Prostate Specific Ag, Serum: <0.1 ng/mL (ref 0.0–4.0)

## 2018-11-11 ENCOUNTER — Other Ambulatory Visit: Payer: Self-pay

## 2018-11-11 ENCOUNTER — Inpatient Hospital Stay (HOSPITAL_BASED_OUTPATIENT_CLINIC_OR_DEPARTMENT_OTHER): Payer: Medicare Other | Admitting: Oncology

## 2018-11-11 VITALS — BP 174/88 | HR 74 | Temp 98.2°F | Resp 18 | Ht 76.0 in | Wt 196.3 lb

## 2018-11-11 DIAGNOSIS — C61 Malignant neoplasm of prostate: Secondary | ICD-10-CM

## 2018-11-11 DIAGNOSIS — C779 Secondary and unspecified malignant neoplasm of lymph node, unspecified: Secondary | ICD-10-CM | POA: Diagnosis not present

## 2018-11-11 DIAGNOSIS — Z9079 Acquired absence of other genital organ(s): Secondary | ICD-10-CM | POA: Diagnosis not present

## 2018-11-11 DIAGNOSIS — C7951 Secondary malignant neoplasm of bone: Secondary | ICD-10-CM | POA: Diagnosis not present

## 2018-11-11 DIAGNOSIS — Z9221 Personal history of antineoplastic chemotherapy: Secondary | ICD-10-CM | POA: Diagnosis not present

## 2018-11-11 DIAGNOSIS — Z452 Encounter for adjustment and management of vascular access device: Secondary | ICD-10-CM | POA: Diagnosis not present

## 2018-11-11 NOTE — Progress Notes (Signed)
Hematology and Oncology Follow Up Visit  Philip [name] [id number] 10-20-1945 73 y.o. 11/11/2018 1:25 PM [name], MDJones, [name], MD   Principle Diagnosis: 73 year old man with castration-resistant prostate cancer diagnosed in 2016.  He was found to have PSA of 2900 with the disease involvement in the lymph nodes and bone.  Prior Therapy: Status post orchiectomy done on November 2016.  Taxotere chemotherapy at 75 mg/m to start on 02/16/2015. He is completed 6 cycles of therapy in March 2017.  Current therapy: Zytiga 1000 mg daily with prednisone at 5 mg daily started on 09/13/2015 that was subsequently discontinued because of complications.    Interim History:  [name] is here for a follow-up.  Since the last visit, he reports no major changes in his health.  He continues to tolerate Zytiga without any recent complaints.  He denies any edema, fatigue or bone pain.  He denies any hospitalization or illnesses.  He continues to be in excellent health and shape and continues to attend activities of daily living without any decline.  He exercises regularly.  He denied any alteration mental status, neuropathy, confusion or dizziness.  Denies any headaches or lethargy.  Denies any night sweats, weight loss or changes in appetite.  Denied orthopnea, dyspnea on exertion or chest discomfort.  Denies shortness of breath, difficulty breathing hemoptysis or cough.  Denies any abdominal distention, nausea, early satiety or dyspepsia.  Denies any hematuria, frequency, dysuria or nocturia.  Denies any skin irritation, dryness or rash.  Denies any ecchymosis or petechiae.  Denies any lymphadenopathy or clotting.  Denies any heat or cold intolerance.  Denies any anxiety or depression.  Remaining review of system is negative.             Medications: Updated on review. Current Outpatient Medications  Medication Sig Dispense Refill  . abiraterone acetate (ZYTIGA) 250 MG tablet TAKE 4 TABLETS BY  MOUTH ONCE DAILY AS DIRECTED.  TAKE 1 HOUR BEFORE OR 2 HOURS AFTER A MEAL 120 tablet 11  . aspirin EC 81 MG tablet Take 81 mg by mouth daily.    Marland Kitchen atorvastatin (LIPITOR) 10 MG tablet Take 1 tablet (10 mg total) by mouth daily. 90 tablet 1  . azelastine (ASTELIN) 0.1 % nasal spray PLACE 1 SPRAY INTO BOTH NOSTRILS 2 (TWO) TIMES DAILY. USE IN EACH NOSTRIL AS DIRECTED 30 mL 1  . BIOTIN 5000 PO Take 1 tablet by mouth daily.    . Calcium Carbonate-Vitamin D3 (CALCIUM 600+D3) 600-400 MG-UNIT TABS Take 1 tablet by mouth daily.    . Denosumab (XGEVA [place]) Inject into the skin.    . fexofenadine (ALLEGRA) 180 MG tablet Take 180 mg by mouth daily.    . fluticasone (FLONASE) 50 MCG/ACT nasal spray SPRAY 2 SPRAYS INTO EACH NOSTRIL EVERY DAY 48 g 2  . hydrOXYzine (ATARAX/VISTARIL) 10 MG tablet 1 TABLET BY MOUTH EVERY 8 HOURS AS NEEDED    . LORazepam (ATIVAN) 1 MG tablet Take 1 tablet (1 mg total) by mouth every 8 (eight) hours as needed for anxiety. 30 tablet 0  . Multiple Vitamin (MULTIVITAMIN) tablet Take 1 tablet by mouth daily.    Marland Kitchen olmesartan (BENICAR) 20 MG tablet TAKE 1 TABLET BY MOUTH EVERY DAY 90 tablet 0   No current facility-administered medications for this visit.      Allergies:  Allergies  Allergen Reactions  . Prednisone Anxiety    Past Medical History, Surgical history, Social history, and Family History without any changes on  review.  Physical Exam:    Blood pressure (!) 174/88, pulse 74, temperature 98.2 F (36.8 C), temperature source Oral, resp. rate 18, height 6\' 4"  (1.93 m), weight 196 lb 4.8 oz (89 kg), SpO2 100 %.      ECOG: 0    General appearance: Alert, awake without any distress. Head: Atraumatic without abnormalities Oropharynx: Without any thrush or ulcers. Eyes: No scleral icterus. Lymph nodes: No lymphadenopathy noted in the cervical, supraclavicular, or axillary nodes Heart:regular rate and rhythm, without any murmurs or gallops.   Lung: Clear to  auscultation without any rhonchi, wheezes or dullness to percussion. Abdomin: Soft, nontender without any shifting dullness or ascites. Musculoskeletal: No clubbing or cyanosis. Neurological: No motor or sensory deficits. Skin: No rashes or lesions.             Lab Results: Lab Results  Component Value Date   WBC 6.3 11/09/2018   HGB 11.7 (L) 11/09/2018   HCT 35.1 (L) 11/09/2018   MCV 93.6 11/09/2018   PLT 180 11/09/2018     Chemistry      Component Value Date/Time   NA 141 11/09/2018 1307   NA 139 02/10/2017 1323   K 3.6 11/09/2018 1307   K 3.4 (L) 02/10/2017 1323   CL 106 11/09/2018 1307   CO2 28 11/09/2018 1307   CO2 23 02/10/2017 1323   BUN 17 11/09/2018 1307   BUN 13.6 02/10/2017 1323   CREATININE 1.05 11/09/2018 1307   CREATININE 0.9 02/10/2017 1323      Component Value Date/Time   CALCIUM 9.7 11/09/2018 1307   CALCIUM 9.2 02/10/2017 1323   ALKPHOS 112 11/09/2018 1307   ALKPHOS 50 02/10/2017 1323   AST 21 11/09/2018 1307   AST 20 02/10/2017 1323   ALT 15 11/09/2018 1307   ALT 15 02/10/2017 1323   BILITOT 0.4 11/09/2018 1307   BILITOT 0.52 02/10/2017 1323        Results for [name], [name] (MRN [id number]) as of 11/11/2018 13:27  Ref. Range 09/23/2018 14:58 11/09/2018 13:07  Prostate Specific Ag, Serum Latest Ref Range: 0.0 - 4.0 ng/mL 0.1 <0.1           Impression and Plan:  73 year old man with:  1.  Castration-resistant prostate cancer with disease to the bone and lymphadenopathy since 2016.    He has tolerated Zytiga with continued excellent PSA response that is currently undetectable.  The natural course of this disease and alternative treatment options were reiterated he develops progression of disease.  These options would include Jevtana chemotherapy, Xofigo among others.  After discussion today, the plan is to continue with Zytiga and repeat imaging studies in December 2020.   2. IV access: Port-A-Cath remains in place and will be  flushed every 6 to 8 weeks.  3. Hypertension: His blood pressure is mildly elevated today but close to normal range between visits.  Of asked him to continue to monitor at this time.  4.  Androgen depravation: He is status post orchiectomy with a testosterone remains at a castrate level.  5.  Bone directed therapy: He is undergoing dental evaluation for possible surgery.  Delton See remains on hold.  6.  Prognosis: His disease is incurable but continues to have excellent response to therapy and aggressive measures are warranted.  7. Follow-up: He will return in 6 to 8 weeks.  25 minutes was spent with the patient face-to-face today.  More than 50% of the time was spent on updating his disease status, reviewing laboratory  data, treatment options and future plan of care.  Zola Button, MD 9/10/20201:25 PM

## 2018-11-12 ENCOUNTER — Telehealth: Payer: Self-pay | Admitting: Oncology

## 2018-11-12 NOTE — Telephone Encounter (Signed)
Called and spoke with patient. Confirmed appts  °

## 2018-11-23 ENCOUNTER — Other Ambulatory Visit: Payer: Self-pay | Admitting: Internal Medicine

## 2018-11-23 DIAGNOSIS — J301 Allergic rhinitis due to pollen: Secondary | ICD-10-CM

## 2018-11-24 ENCOUNTER — Ambulatory Visit (INDEPENDENT_AMBULATORY_CARE_PROVIDER_SITE_OTHER): Payer: Medicare Other

## 2018-11-24 ENCOUNTER — Other Ambulatory Visit: Payer: Self-pay

## 2018-11-24 DIAGNOSIS — Z23 Encounter for immunization: Secondary | ICD-10-CM | POA: Diagnosis not present

## 2018-12-06 DIAGNOSIS — D2261 Melanocytic nevi of right upper limb, including shoulder: Secondary | ICD-10-CM | POA: Diagnosis not present

## 2018-12-06 DIAGNOSIS — L570 Actinic keratosis: Secondary | ICD-10-CM | POA: Diagnosis not present

## 2018-12-06 DIAGNOSIS — L821 Other seborrheic keratosis: Secondary | ICD-10-CM | POA: Diagnosis not present

## 2018-12-06 DIAGNOSIS — L820 Inflamed seborrheic keratosis: Secondary | ICD-10-CM | POA: Diagnosis not present

## 2018-12-06 DIAGNOSIS — L814 Other melanin hyperpigmentation: Secondary | ICD-10-CM | POA: Diagnosis not present

## 2018-12-06 DIAGNOSIS — D485 Neoplasm of uncertain behavior of skin: Secondary | ICD-10-CM | POA: Diagnosis not present

## 2018-12-06 DIAGNOSIS — D1801 Hemangioma of skin and subcutaneous tissue: Secondary | ICD-10-CM | POA: Diagnosis not present

## 2018-12-06 DIAGNOSIS — D225 Melanocytic nevi of trunk: Secondary | ICD-10-CM | POA: Diagnosis not present

## 2018-12-24 ENCOUNTER — Other Ambulatory Visit: Payer: Self-pay

## 2018-12-24 DIAGNOSIS — C61 Malignant neoplasm of prostate: Secondary | ICD-10-CM

## 2018-12-27 ENCOUNTER — Other Ambulatory Visit: Payer: Self-pay | Admitting: *Deleted

## 2018-12-27 ENCOUNTER — Other Ambulatory Visit: Payer: Self-pay

## 2018-12-27 ENCOUNTER — Inpatient Hospital Stay: Payer: Medicare Other

## 2018-12-27 ENCOUNTER — Inpatient Hospital Stay: Payer: Medicare Other | Attending: Oncology

## 2018-12-27 DIAGNOSIS — C61 Malignant neoplasm of prostate: Secondary | ICD-10-CM | POA: Diagnosis not present

## 2018-12-27 DIAGNOSIS — Z452 Encounter for adjustment and management of vascular access device: Secondary | ICD-10-CM | POA: Diagnosis not present

## 2018-12-27 DIAGNOSIS — Z7982 Long term (current) use of aspirin: Secondary | ICD-10-CM | POA: Insufficient documentation

## 2018-12-27 DIAGNOSIS — Z79899 Other long term (current) drug therapy: Secondary | ICD-10-CM | POA: Insufficient documentation

## 2018-12-27 DIAGNOSIS — C7951 Secondary malignant neoplasm of bone: Secondary | ICD-10-CM | POA: Insufficient documentation

## 2018-12-27 DIAGNOSIS — Z9079 Acquired absence of other genital organ(s): Secondary | ICD-10-CM | POA: Insufficient documentation

## 2018-12-27 DIAGNOSIS — Z95828 Presence of other vascular implants and grafts: Secondary | ICD-10-CM

## 2018-12-27 DIAGNOSIS — I10 Essential (primary) hypertension: Secondary | ICD-10-CM | POA: Diagnosis not present

## 2018-12-27 LAB — CBC WITH DIFFERENTIAL (CANCER CENTER ONLY)
Abs Immature Granulocytes: 0.01 10*3/uL (ref 0.00–0.07)
Basophils Absolute: 0 10*3/uL (ref 0.0–0.1)
Basophils Relative: 0 %
Eosinophils Absolute: 0.1 10*3/uL (ref 0.0–0.5)
Eosinophils Relative: 2 %
HCT: 35.2 % — ABNORMAL LOW (ref 39.0–52.0)
Hemoglobin: 11.7 g/dL — ABNORMAL LOW (ref 13.0–17.0)
Immature Granulocytes: 0 %
Lymphocytes Relative: 34 %
Lymphs Abs: 2.1 10*3/uL (ref 0.7–4.0)
MCH: 31.5 pg (ref 26.0–34.0)
MCHC: 33.2 g/dL (ref 30.0–36.0)
MCV: 94.6 fL (ref 80.0–100.0)
Monocytes Absolute: 0.5 10*3/uL (ref 0.1–1.0)
Monocytes Relative: 8 %
Neutro Abs: 3.4 10*3/uL (ref 1.7–7.7)
Neutrophils Relative %: 56 %
Platelet Count: 186 10*3/uL (ref 150–400)
RBC: 3.72 MIL/uL — ABNORMAL LOW (ref 4.22–5.81)
RDW: 13 % (ref 11.5–15.5)
WBC Count: 6 10*3/uL (ref 4.0–10.5)
nRBC: 0 % (ref 0.0–0.2)

## 2018-12-27 LAB — CMP (CANCER CENTER ONLY)
ALT: 15 U/L (ref 0–44)
AST: 19 U/L (ref 15–41)
Albumin: 3.7 g/dL (ref 3.5–5.0)
Alkaline Phosphatase: 96 U/L (ref 38–126)
Anion gap: 11 (ref 5–15)
BUN: 19 mg/dL (ref 8–23)
CO2: 24 mmol/L (ref 22–32)
Calcium: 9.4 mg/dL (ref 8.9–10.3)
Chloride: 107 mmol/L (ref 98–111)
Creatinine: 0.96 mg/dL (ref 0.61–1.24)
GFR, Est AFR Am: >60 mL/min (ref >60)
GFR, Estimated: >60 mL/min (ref >60)
Glucose, Bld: 107 mg/dL — ABNORMAL HIGH (ref 70–99)
Potassium: 3.5 mmol/L (ref 3.5–5.1)
Sodium: 142 mmol/L (ref 135–145)
Total Bilirubin: 0.4 mg/dL (ref 0.3–1.2)
Total Protein: 6.5 g/dL (ref 6.5–8.1)

## 2018-12-27 MED ORDER — SODIUM CHLORIDE 0.9% FLUSH
10.0000 mL | Freq: Once | INTRAVENOUS | Status: AC
  Administered 2018-12-27: 14:00:00 10 mL
  Filled 2018-12-27: qty 10

## 2018-12-27 MED ORDER — LIDOCAINE-PRILOCAINE 2.5-2.5 % EX CREA: 1.0000 "application " | TOPICAL_CREAM | CUTANEOUS | 0 refills | Status: DC | PRN

## 2018-12-27 MED ORDER — HEPARIN SOD (PORK) LOCK FLUSH 100 UNIT/ML IV SOLN
500.0000 [IU] | Freq: Once | INTRAVENOUS | Status: AC
  Administered 2018-12-27: 500 [IU]
  Filled 2018-12-27: qty 5

## 2018-12-28 ENCOUNTER — Other Ambulatory Visit: Payer: Medicare Other

## 2018-12-28 LAB — PROSTATE-SPECIFIC AG, SERUM (LABCORP): Prostate Specific Ag, Serum: <0.1 ng/mL (ref 0.0–4.0)

## 2018-12-29 ENCOUNTER — Other Ambulatory Visit: Payer: Self-pay

## 2018-12-29 ENCOUNTER — Inpatient Hospital Stay (HOSPITAL_BASED_OUTPATIENT_CLINIC_OR_DEPARTMENT_OTHER): Payer: Medicare Other | Admitting: Oncology

## 2018-12-29 VITALS — BP 164/89 | HR 71 | Temp 98.2°F | Resp 17 | Ht 76.0 in | Wt 197.7 lb

## 2018-12-29 DIAGNOSIS — I10 Essential (primary) hypertension: Secondary | ICD-10-CM | POA: Diagnosis not present

## 2018-12-29 DIAGNOSIS — C61 Malignant neoplasm of prostate: Secondary | ICD-10-CM

## 2018-12-29 DIAGNOSIS — C7951 Secondary malignant neoplasm of bone: Secondary | ICD-10-CM | POA: Diagnosis not present

## 2018-12-29 DIAGNOSIS — Z452 Encounter for adjustment and management of vascular access device: Secondary | ICD-10-CM | POA: Diagnosis not present

## 2018-12-29 DIAGNOSIS — Z79899 Other long term (current) drug therapy: Secondary | ICD-10-CM | POA: Diagnosis not present

## 2018-12-29 DIAGNOSIS — Z7982 Long term (current) use of aspirin: Secondary | ICD-10-CM | POA: Diagnosis not present

## 2018-12-29 NOTE — Progress Notes (Signed)
Hematology and Oncology Follow Up Visit  Philip [name] [id number] 07-Sep-1945 73 y.o. 12/29/2018 3:17 PM Philip [name], MDJones, [name] Right, MD   Principle Diagnosis: 72 year old man with advanced prostate cancer with disease to the bone and lymphadenopathy noted in 2016.  His PSA at the time of diagnosis was 2900.   Prior Therapy: Status post orchiectomy done on November 2016.  Taxotere chemotherapy at 75 mg/m to start on 02/16/2015. He is completed 6 cycles of therapy in March 2017.  Current therapy: Zytiga 1000 mg daily started in July 2017.  He was taking prednisone at 5 mg daily and subsequently discontinued because of poor tolerance.  Interim History:  Philip [name] returns today for repeat evaluation.  Since the last visit, he reports no major changes in his health.  He continues to tolerate Zytiga without any complaints.  He denies any nausea, fatigue or tiredness.  He denies any worsening edema or bone pain.  Continues to be active and attends to activities of daily living.  He exercises regularly and maintain reasonable weight.  He denied headaches, blurry vision, syncope or seizures.  Denies any fevers, chills or sweats.  Denied chest pain, palpitation, orthopnea or leg edema.  Denied cough, wheezing or hemoptysis.  Denied nausea, vomiting or abdominal pain.  Denies any constipation or diarrhea.  Denies any frequency urgency or hesitancy.  Denies any arthralgias or myalgias.  Denies any skin rashes or lesions.  Denies any bleeding or clotting tendency.  Denies any easy bruising.  Denies any hair or nail changes.  Denies any anxiety or depression.  Remaining review of system is negative.               Medications: Unchanged on review. Current Outpatient Medications  Medication Sig Dispense Refill  . abiraterone acetate (ZYTIGA) 250 MG tablet TAKE 4 TABLETS BY MOUTH ONCE DAILY AS DIRECTED.  TAKE 1 HOUR BEFORE OR 2 HOURS AFTER A MEAL 120 tablet 11  . aspirin EC 81 MG tablet Take  81 mg by mouth daily.    Marland Kitchen atorvastatin (LIPITOR) 10 MG tablet Take 1 tablet (10 mg total) by mouth daily. 90 tablet 1  . azelastine (ASTELIN) 0.1 % nasal spray PLACE 1 SPRAY INTO BOTH NOSTRILS 2 (TWO) TIMES DAILY. USE IN EACH NOSTRIL AS DIRECTED 30 mL 1  . BIOTIN 5000 PO Take 1 tablet by mouth daily.    . Calcium Carbonate-Vitamin D3 (CALCIUM 600+D3) 600-400 MG-UNIT TABS Take 1 tablet by mouth daily.    . Denosumab (XGEVA ) Inject into the skin.    . fexofenadine (ALLEGRA) 180 MG tablet Take 180 mg by mouth daily.    . fluticasone (FLONASE) 50 MCG/ACT nasal spray SPRAY 2 SPRAYS INTO EACH NOSTRIL EVERY DAY 48 mL 2  . hydrOXYzine (ATARAX/VISTARIL) 10 MG tablet 1 TABLET BY MOUTH EVERY 8 HOURS AS NEEDED    . lidocaine-prilocaine (EMLA) cream Apply 1 application topically as needed. 30 g 0  . LORazepam (ATIVAN) 1 MG tablet Take 1 tablet (1 mg total) by mouth every 8 (eight) hours as needed for anxiety. 30 tablet 0  . Multiple Vitamin (MULTIVITAMIN) tablet Take 1 tablet by mouth daily.    Marland Kitchen olmesartan (BENICAR) 20 MG tablet TAKE 1 TABLET BY MOUTH EVERY DAY 90 tablet 0   No current facility-administered medications for this visit.      Allergies:  Allergies  Allergen Reactions  . Prednisone Anxiety    Past Medical History, Surgical history, Social history, and Family History updated  without changes.  Physical Exam:     Blood pressure (!) 164/89, pulse 71, temperature 98.2 F (36.8 C), temperature source Oral, resp. rate 17, height 6\' 4"  (1.93 m), weight 197 lb 11.2 oz (89.7 kg), SpO2 100 %.      ECOG: 0     General appearance: Comfortable appearing without any discomfort Head: Normocephalic without any trauma Oropharynx: Mucous membranes are moist and pink without any thrush or ulcers. Eyes: Pupils are equal and round reactive to light. Lymph nodes: No cervical, supraclavicular, inguinal or axillary lymphadenopathy.   Heart:regular rate and rhythm.  S1 and S2 without leg  edema. Lung: Clear without any rhonchi or wheezes.  No dullness to percussion. Abdomin: Soft, nontender, nondistended with good bowel sounds.  No hepatosplenomegaly. Musculoskeletal: No joint deformity or effusion.  Full range of motion noted. Neurological: No deficits noted on motor, sensory and deep tendon reflex exam. Skin: No petechial rash or dryness.  Appeared moist.              Lab Results: Lab Results  Component Value Date   WBC 6.0 12/27/2018   HGB 11.7 (L) 12/27/2018   HCT 35.2 (L) 12/27/2018   MCV 94.6 12/27/2018   PLT 186 12/27/2018     Chemistry      Component Value Date/Time   NA 142 12/27/2018 1350   NA 139 02/10/2017 1323   K 3.5 12/27/2018 1350   K 3.4 (L) 02/10/2017 1323   CL 107 12/27/2018 1350   CO2 24 12/27/2018 1350   CO2 23 02/10/2017 1323   BUN 19 12/27/2018 1350   BUN 13.6 02/10/2017 1323   CREATININE 0.96 12/27/2018 1350   CREATININE 0.9 02/10/2017 1323      Component Value Date/Time   CALCIUM 9.4 12/27/2018 1350   CALCIUM 9.2 02/10/2017 1323   ALKPHOS 96 12/27/2018 1350   ALKPHOS 50 02/10/2017 1323   AST 19 12/27/2018 1350   AST 20 02/10/2017 1323   ALT 15 12/27/2018 1350   ALT 15 02/10/2017 1323   BILITOT 0.4 12/27/2018 1350   BILITOT 0.52 02/10/2017 1323        Results for Philip [name] (MRN [id number]) as of 12/29/2018 15:19  Ref. Range 09/23/2018 14:58 11/09/2018 13:07 12/27/2018 13:50  Prostate Specific Ag, Serum Latest Ref Range: 0.0 - 4.0 ng/mL 0.1 <0.1 <0.1          Impression and Plan:  73 year old man with:  1.  Advanced prostate cancer with disease to the bone and lymphadenopathy diagnosed in 2016.  He has castration-resistant disease at this time.  He continues to reports no major complications related to [facility] with excellent PSA response.  The natural course of this disease as well as alternative treatment options were discussed.  At this time I have recommended continuing the same dose and schedule.   Alternative therapy such as salvage chemotherapy form of Jevtana will be deferred unless he developed progression of disease.  We will repeat staging work-up including CT scan and bone scan in December 2020.   2. IV access: Port-A-Cath will be flushed periodically.  He prefers to keep it in for the time being.  3. Hypertension: He is on the antihypertensive medication which controlled his blood pressure mostly.  Blood pressure has been close to normal range between visits.  4.  Androgen depravation: Testosterone level remains at a castrate level after orchiectomy without any additional androgen deprivation.  5.  Bone directed therapy: He is status post dental surgery and Delton See will  be deferred till at least February 2021.   6.  Prognosis: Therapy remains palliative at this time although aggressive measures are warranted.  7. Follow-up: In 6 weeks for a follow-up visit.  25 minutes was spent with the patient face-to-face today.  More than 50% of the time was spent on reviewing laboratory data, disease status update and future treatment options.  Zola Button, MD 10/28/20203:17 PM

## 2018-12-30 ENCOUNTER — Telehealth: Payer: Self-pay | Admitting: Oncology

## 2018-12-30 DIAGNOSIS — H02831 Dermatochalasis of right upper eyelid: Secondary | ICD-10-CM | POA: Diagnosis not present

## 2018-12-30 DIAGNOSIS — H25013 Cortical age-related cataract, bilateral: Secondary | ICD-10-CM | POA: Diagnosis not present

## 2018-12-30 DIAGNOSIS — H2513 Age-related nuclear cataract, bilateral: Secondary | ICD-10-CM | POA: Diagnosis not present

## 2018-12-30 DIAGNOSIS — H02834 Dermatochalasis of left upper eyelid: Secondary | ICD-10-CM | POA: Diagnosis not present

## 2018-12-30 NOTE — Telephone Encounter (Signed)
Scheduled per los. Called and spoke with patient. Confirmed appts  

## 2019-01-16 ENCOUNTER — Other Ambulatory Visit: Payer: Self-pay | Admitting: Internal Medicine

## 2019-01-16 DIAGNOSIS — I10 Essential (primary) hypertension: Secondary | ICD-10-CM

## 2019-02-03 ENCOUNTER — Encounter (HOSPITAL_COMMUNITY)
Admission: RE | Admit: 2019-02-03 | Discharge: 2019-02-03 | Disposition: A | Discharge status: Home / Self Care | Payer: Medicare Other | Source: Ambulatory Visit | Attending: Oncology | Admitting: Oncology

## 2019-02-03 ENCOUNTER — Other Ambulatory Visit: Payer: Self-pay

## 2019-02-03 ENCOUNTER — Inpatient Hospital Stay: Payer: Medicare Other

## 2019-02-03 ENCOUNTER — Inpatient Hospital Stay: Payer: Medicare Other | Attending: Oncology

## 2019-02-03 ENCOUNTER — Ambulatory Visit (HOSPITAL_COMMUNITY)
Admission: RE | Admit: 2019-02-03 | Discharge: 2019-02-03 | Disposition: A | Discharge status: Home / Self Care | Payer: Medicare Other | Source: Ambulatory Visit | Attending: Oncology | Admitting: Oncology

## 2019-02-03 DIAGNOSIS — C61 Malignant neoplasm of prostate: Secondary | ICD-10-CM | POA: Insufficient documentation

## 2019-02-03 DIAGNOSIS — Z95828 Presence of other vascular implants and grafts: Secondary | ICD-10-CM | POA: Insufficient documentation

## 2019-02-03 DIAGNOSIS — Z7982 Long term (current) use of aspirin: Secondary | ICD-10-CM | POA: Insufficient documentation

## 2019-02-03 DIAGNOSIS — C7951 Secondary malignant neoplasm of bone: Secondary | ICD-10-CM | POA: Insufficient documentation

## 2019-02-03 DIAGNOSIS — I10 Essential (primary) hypertension: Secondary | ICD-10-CM | POA: Diagnosis not present

## 2019-02-03 DIAGNOSIS — Z9079 Acquired absence of other genital organ(s): Secondary | ICD-10-CM | POA: Insufficient documentation

## 2019-02-03 DIAGNOSIS — Z79899 Other long term (current) drug therapy: Secondary | ICD-10-CM | POA: Diagnosis not present

## 2019-02-03 LAB — CMP (CANCER CENTER ONLY)
ALT: 16 U/L (ref 0–44)
AST: 22 U/L (ref 15–41)
Albumin: 4 g/dL (ref 3.5–5.0)
Alkaline Phosphatase: 103 U/L (ref 38–126)
Anion gap: 9 (ref 5–15)
BUN: 17 mg/dL (ref 8–23)
CO2: 28 mmol/L (ref 22–32)
Calcium: 9.9 mg/dL (ref 8.9–10.3)
Chloride: 105 mmol/L (ref 98–111)
Creatinine: 1.01 mg/dL (ref 0.61–1.24)
GFR, Est AFR Am: >60 mL/min (ref >60)
GFR, Estimated: >60 mL/min (ref >60)
Glucose, Bld: 97 mg/dL (ref 70–99)
Potassium: 3.8 mmol/L (ref 3.5–5.1)
Sodium: 142 mmol/L (ref 135–145)
Total Bilirubin: 0.6 mg/dL (ref 0.3–1.2)
Total Protein: 7 g/dL (ref 6.5–8.1)

## 2019-02-03 LAB — CBC WITH DIFFERENTIAL (CANCER CENTER ONLY)
Abs Immature Granulocytes: 0.01 10*3/uL (ref 0.00–0.07)
Basophils Absolute: 0 10*3/uL (ref 0.0–0.1)
Basophils Relative: 1 %
Eosinophils Absolute: 0.1 10*3/uL (ref 0.0–0.5)
Eosinophils Relative: 2 %
HCT: 38 % — ABNORMAL LOW (ref 39.0–52.0)
Hemoglobin: 12.5 g/dL — ABNORMAL LOW (ref 13.0–17.0)
Immature Granulocytes: 0 %
Lymphocytes Relative: 28 %
Lymphs Abs: 1.7 10*3/uL (ref 0.7–4.0)
MCH: 30.7 pg (ref 26.0–34.0)
MCHC: 32.9 g/dL (ref 30.0–36.0)
MCV: 93.4 fL (ref 80.0–100.0)
Monocytes Absolute: 0.6 10*3/uL (ref 0.1–1.0)
Monocytes Relative: 10 %
Neutro Abs: 3.6 10*3/uL (ref 1.7–7.7)
Neutrophils Relative %: 59 %
Platelet Count: 199 10*3/uL (ref 150–400)
RBC: 4.07 MIL/uL — ABNORMAL LOW (ref 4.22–5.81)
RDW: 13.1 % (ref 11.5–15.5)
WBC Count: 6.1 10*3/uL (ref 4.0–10.5)
nRBC: 0 % (ref 0.0–0.2)

## 2019-02-03 MED ORDER — SODIUM CHLORIDE (PF) 0.9 % IJ SOLN
INTRAMUSCULAR | Status: AC
  Filled 2019-02-03: qty 50

## 2019-02-03 MED ORDER — HEPARIN SOD (PORK) LOCK FLUSH 100 UNIT/ML IV SOLN
500.0000 [IU] | Freq: Once | INTRAVENOUS | Status: AC
  Administered 2019-02-03: 500 [IU] via INTRAVENOUS

## 2019-02-03 MED ORDER — HEPARIN SOD (PORK) LOCK FLUSH 100 UNIT/ML IV SOLN
INTRAVENOUS | Status: AC
  Filled 2019-02-03: qty 5

## 2019-02-03 MED ORDER — SODIUM CHLORIDE 0.9% FLUSH
10.0000 mL | Freq: Once | INTRAVENOUS | Status: AC
  Administered 2019-02-03: 09:00:00 10 mL
  Filled 2019-02-03: qty 10

## 2019-02-03 MED ORDER — IOHEXOL 300 MG/ML  SOLN
100.0000 mL | Freq: Once | INTRAMUSCULAR | Status: AC | PRN
  Administered 2019-02-03: 100 mL via INTRAVENOUS

## 2019-02-03 MED ORDER — TECHNETIUM TC 99M MEDRONATE IV KIT
20.1000 | PACK | Freq: Once | INTRAVENOUS | Status: AC | PRN
  Administered 2019-02-03: 20.1 via INTRAVENOUS

## 2019-02-04 LAB — PROSTATE-SPECIFIC AG, SERUM (LABCORP): Prostate Specific Ag, Serum: <0.1 ng/mL (ref 0.0–4.0)

## 2019-02-10 ENCOUNTER — Other Ambulatory Visit: Payer: Self-pay

## 2019-02-10 ENCOUNTER — Inpatient Hospital Stay (HOSPITAL_BASED_OUTPATIENT_CLINIC_OR_DEPARTMENT_OTHER): Payer: Medicare Other | Admitting: Oncology

## 2019-02-10 VITALS — BP 175/87 | HR 73 | Temp 97.8°F | Resp 18 | Ht 76.0 in | Wt 197.2 lb

## 2019-02-10 DIAGNOSIS — C61 Malignant neoplasm of prostate: Secondary | ICD-10-CM

## 2019-02-10 NOTE — Progress Notes (Signed)
Hematology and Oncology Follow Up Visit  Philip [name] [id number] January 28, 1946 73 y.o. 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  His PSA was 2900 at time of diagnosis.    Prior Therapy: Status post orchiectomy done on November 2016.  Taxotere chemotherapy at 75 mg/m to start on 02/16/2015. He is completed 6 cycles of therapy in March 2017.  Current therapy: Zytiga 1000 mg daily started in July 2017.  Prednisone was discontinued for better tolerance.  Interim History:  [name] presents today for a follow-up.  Since the last visit, he reports no major changes in his health.  He continues to tolerate Zytiga without any recent complaints.  He denies any nausea, abdominal pain or edema.  He denies any bone pain or pathological fractures.  He denies recent hospitalization or illnesses.  Continues to perform activities of daily living without any decline in ability to do so.  He continues exercises regularly and has excellent quality of life.   Patient denied any alteration mental status, neuropathy, confusion or dizziness.  Denies any headaches or lethargy.  Denies any night sweats, weight loss or changes in appetite.  Denied orthopnea, dyspnea on exertion or chest discomfort.  Denies shortness of breath, difficulty breathing hemoptysis or cough.  Denies any abdominal distention, nausea, early satiety or dyspepsia.  Denies any hematuria, frequency, dysuria or nocturia.  Denies any skin irritation, dryness or rash.  Denies any ecchymosis or petechiae.  Denies any lymphadenopathy or clotting.  Denies any heat or cold intolerance.  Denies any anxiety or depression.  Remaining review of system is negative.   Medications: Without any changes on review. Current Outpatient Medications  Medication Sig Dispense Refill  . abiraterone acetate (ZYTIGA) 250 MG  tablet TAKE 4 TABLETS BY MOUTH ONCE DAILY AS DIRECTED.  TAKE 1 HOUR BEFORE OR 2 HOURS AFTER A MEAL 120 tablet 11  . aspirin EC 81 MG tablet Take 81 mg by mouth daily.    Marland Kitchen atorvastatin (LIPITOR) 10 MG tablet Take 1 tablet (10 mg total) by mouth daily. 90 tablet 1  . azelastine (ASTELIN) 0.1 % nasal spray PLACE 1 SPRAY INTO BOTH NOSTRILS 2 (TWO) TIMES DAILY. USE IN EACH NOSTRIL AS DIRECTED 30 mL 1  . BIOTIN 5000 PO Take 1 tablet by mouth daily.    . Calcium Carbonate-Vitamin D3 (CALCIUM 600+D3) 600-400 MG-UNIT TABS Take 1 tablet by mouth daily.    . Denosumab (XGEVA [place]) Inject into the skin.    . fexofenadine (ALLEGRA) 180 MG tablet Take 180 mg by mouth daily.    . fluticasone (FLONASE) 50 MCG/ACT nasal spray SPRAY 2 SPRAYS INTO EACH NOSTRIL EVERY DAY 48 mL 2  . hydrOXYzine (ATARAX/VISTARIL) 10 MG tablet 1 TABLET BY MOUTH EVERY 8 HOURS AS NEEDED    . lidocaine-prilocaine (EMLA) cream Apply 1 application topically as needed. 30 g 0  . LORazepam (ATIVAN) 1 MG tablet Take 1 tablet (1 mg total) by mouth every 8 (eight) hours as needed for anxiety. 30 tablet 0  . Multiple Vitamin (MULTIVITAMIN) tablet Take 1 tablet by mouth daily.    Marland Kitchen olmesartan (BENICAR) 20 MG tablet TAKE 1 TABLET BY MOUTH EVERY DAY 90 tablet 0   No current facility-administered medications for this visit.     Allergies:  Allergies  Allergen Reactions  . Prednisone Anxiety    Past Medical History, Surgical history, Social  history, and Family History unchanged on review.  Physical Exam:      Blood pressure (!) 175/87, pulse 73, temperature 97.8 F (36.6 C), temperature source Temporal, resp. rate 18, height 6\' 4"  (1.93 m), weight 197 lb 3.2 oz (89.4 kg), SpO2 100 %.      ECOG: 0   General appearance: Alert, awake without any distress. Head: Atraumatic without abnormalities Oropharynx: Without any thrush or ulcers. Eyes: No scleral icterus. Lymph nodes: No lymphadenopathy noted in the cervical,  supraclavicular, or axillary nodes Heart:regular rate and rhythm, without any murmurs or gallops.   Lung: Clear to auscultation without any rhonchi, wheezes or dullness to percussion. Abdomin: Soft, nontender without any shifting dullness or ascites. Musculoskeletal: No clubbing or cyanosis. Neurological: No motor or sensory deficits. Skin: No rashes or lesions.             Lab Results: Lab Results  Component Value Date   WBC 6.1 02/03/2019   HGB 12.5 (L) 02/03/2019   HCT 38.0 (L) 02/03/2019   MCV 93.4 02/03/2019   PLT 199 02/03/2019     Chemistry      Component Value Date/Time   NA 142 02/03/2019 0841   NA 139 02/10/2017 1323   K 3.8 02/03/2019 0841   K 3.4 (L) 02/10/2017 1323   CL 105 02/03/2019 0841   CO2 28 02/03/2019 0841   CO2 23 02/10/2017 1323   BUN 17 02/03/2019 0841   BUN 13.6 02/10/2017 1323   CREATININE 1.01 02/03/2019 0841   CREATININE 0.9 02/10/2017 1323      Component Value Date/Time   CALCIUM 9.9 02/03/2019 0841   CALCIUM 9.2 02/10/2017 1323   ALKPHOS 103 02/03/2019 0841   ALKPHOS 50 02/10/2017 1323   AST 22 02/03/2019 0841   AST 20 02/10/2017 1323   ALT 16 02/03/2019 0841   ALT 15 02/10/2017 1323   BILITOT 0.6 02/03/2019 0841   BILITOT 0.52 02/10/2017 1323          Results for Philip [name], Philip [name] (MRN [id number]) as of 02/10/2019 14:29  Ref. Range 12/27/2018 13:50 02/03/2019 08:41  Prostate Specific Ag, Serum Latest Ref Range: 0.0 - 4.0 ng/mL <0.1 <0.1         Impression and Plan:  73 year old man with:  1.  Castration-resistant prostate cancer with disease to the bone and lymphadenopathy since 2016.   He is a PSA remains undetectable and imaging studies obtained on 02/03/2019 showed continues to have excellent response to therapy.  CT scan and bone scan personally reviewed and discussed with the patient and showed no visceral disease with the resolution of his abdominal adenopathy.  Risks and benefits of continuing this therapy at  this time utilizing Zytiga was reviewed.  Alternative options including systemic chemotherapy among others were reiterated.  At this time he does not require any additional therapy and will continue the same dose and schedule.  He is agreeable to continue.   2. IV access: Port-A-Cath remains in place and will be flushed periodically.  3. Hypertension: His blood pressure is mildly elevated today and will be rechecked before he leaves.  His blood pressure between visits has been within normal range.  4.  Androgen depravation: He is status post orchiectomy without any additional androgen deprivation needed.  5.  Bone directed therapy: He continues to heal from dental surgery and Delton See remains on hold till least February 2021.  6.  Prognosis: His disease remains incurable although aggressive measures are warranted given his excellent performance status.  7. Follow-up: In 6 to 8 weeks for repeat evaluation.  25 minutes was spent with the patient face-to-face today.  More than 50% of the time was dedicated to reviewing imaging studies, laboratory data as well as answering questions regarding alternative treatment options and plan of care.  Zola Button, MD 12/10/20202:37 PM

## 2019-02-11 ENCOUNTER — Telehealth: Payer: Self-pay | Admitting: Oncology

## 2019-02-11 NOTE — Telephone Encounter (Signed)
Scheduled appt per 12/10 los.  A calendar will be mailed out. 

## 2019-04-02 ENCOUNTER — Ambulatory Visit: Payer: Medicare Other

## 2019-04-03 ENCOUNTER — Other Ambulatory Visit: Payer: Self-pay | Admitting: Internal Medicine

## 2019-04-03 DIAGNOSIS — E785 Hyperlipidemia, unspecified: Secondary | ICD-10-CM

## 2019-04-03 DIAGNOSIS — I700 Atherosclerosis of aorta: Secondary | ICD-10-CM

## 2019-04-05 ENCOUNTER — Other Ambulatory Visit: Payer: Self-pay

## 2019-04-05 ENCOUNTER — Inpatient Hospital Stay: Payer: Medicare Other | Attending: Oncology

## 2019-04-05 ENCOUNTER — Inpatient Hospital Stay: Payer: Medicare Other

## 2019-04-05 DIAGNOSIS — Z9079 Acquired absence of other genital organ(s): Secondary | ICD-10-CM | POA: Insufficient documentation

## 2019-04-05 DIAGNOSIS — Z7982 Long term (current) use of aspirin: Secondary | ICD-10-CM | POA: Diagnosis not present

## 2019-04-05 DIAGNOSIS — C61 Malignant neoplasm of prostate: Secondary | ICD-10-CM | POA: Diagnosis not present

## 2019-04-05 DIAGNOSIS — C7951 Secondary malignant neoplasm of bone: Secondary | ICD-10-CM | POA: Insufficient documentation

## 2019-04-05 DIAGNOSIS — Z79899 Other long term (current) drug therapy: Secondary | ICD-10-CM | POA: Insufficient documentation

## 2019-04-05 DIAGNOSIS — I10 Essential (primary) hypertension: Secondary | ICD-10-CM | POA: Diagnosis not present

## 2019-04-05 DIAGNOSIS — Z9221 Personal history of antineoplastic chemotherapy: Secondary | ICD-10-CM | POA: Insufficient documentation

## 2019-04-05 DIAGNOSIS — Z95828 Presence of other vascular implants and grafts: Secondary | ICD-10-CM

## 2019-04-05 LAB — CMP (CANCER CENTER ONLY)
ALT: 16 U/L (ref 0–44)
AST: 28 U/L (ref 15–41)
Albumin: 3.9 g/dL (ref 3.5–5.0)
Alkaline Phosphatase: 106 U/L (ref 38–126)
Anion gap: 9 (ref 5–15)
BUN: 19 mg/dL (ref 8–23)
CO2: 25 mmol/L (ref 22–32)
Calcium: 9.6 mg/dL (ref 8.9–10.3)
Chloride: 107 mmol/L (ref 98–111)
Creatinine: 1.01 mg/dL (ref 0.61–1.24)
GFR, Est AFR Am: >60 mL/min (ref >60)
GFR, Estimated: >60 mL/min (ref >60)
Glucose, Bld: 96 mg/dL (ref 70–99)
Potassium: 3.9 mmol/L (ref 3.5–5.1)
Sodium: 141 mmol/L (ref 135–145)
Total Bilirubin: 0.5 mg/dL (ref 0.3–1.2)
Total Protein: 6.6 g/dL (ref 6.5–8.1)

## 2019-04-05 LAB — CBC WITH DIFFERENTIAL (CANCER CENTER ONLY)
Abs Immature Granulocytes: 0.03 10*3/uL (ref 0.00–0.07)
Basophils Absolute: 0 10*3/uL (ref 0.0–0.1)
Basophils Relative: 1 %
Eosinophils Absolute: 0.1 10*3/uL (ref 0.0–0.5)
Eosinophils Relative: 1 %
HCT: 37.4 % — ABNORMAL LOW (ref 39.0–52.0)
Hemoglobin: 12.3 g/dL — ABNORMAL LOW (ref 13.0–17.0)
Immature Granulocytes: 1 %
Lymphocytes Relative: 33 %
Lymphs Abs: 1.9 10*3/uL (ref 0.7–4.0)
MCH: 30.8 pg (ref 26.0–34.0)
MCHC: 32.9 g/dL (ref 30.0–36.0)
MCV: 93.7 fL (ref 80.0–100.0)
Monocytes Absolute: 0.5 10*3/uL (ref 0.1–1.0)
Monocytes Relative: 9 %
Neutro Abs: 3.1 10*3/uL (ref 1.7–7.7)
Neutrophils Relative %: 55 %
Platelet Count: 202 10*3/uL (ref 150–400)
RBC: 3.99 MIL/uL — ABNORMAL LOW (ref 4.22–5.81)
RDW: 12.8 % (ref 11.5–15.5)
WBC Count: 5.6 10*3/uL (ref 4.0–10.5)
nRBC: 0 % (ref 0.0–0.2)

## 2019-04-05 MED ORDER — SODIUM CHLORIDE 0.9% FLUSH
10.0000 mL | Freq: Once | INTRAVENOUS | Status: AC
  Administered 2019-04-05: 11:00:00 10 mL
  Filled 2019-04-05: qty 10

## 2019-04-05 MED ORDER — HEPARIN SOD (PORK) LOCK FLUSH 100 UNIT/ML IV SOLN
500.0000 [IU] | Freq: Once | INTRAVENOUS | Status: AC
  Administered 2019-04-05: 11:00:00 500 [IU]
  Filled 2019-04-05: qty 5

## 2019-04-06 ENCOUNTER — Inpatient Hospital Stay: Payer: Medicare Other

## 2019-04-06 ENCOUNTER — Inpatient Hospital Stay (HOSPITAL_BASED_OUTPATIENT_CLINIC_OR_DEPARTMENT_OTHER): Payer: Medicare Other | Admitting: Oncology

## 2019-04-06 VITALS — BP 160/93 | HR 82 | Temp 98.0°F | Resp 17 | Ht 76.0 in | Wt 199.9 lb

## 2019-04-06 DIAGNOSIS — C7951 Secondary malignant neoplasm of bone: Secondary | ICD-10-CM | POA: Diagnosis not present

## 2019-04-06 DIAGNOSIS — Z95828 Presence of other vascular implants and grafts: Secondary | ICD-10-CM

## 2019-04-06 DIAGNOSIS — Z9221 Personal history of antineoplastic chemotherapy: Secondary | ICD-10-CM | POA: Diagnosis not present

## 2019-04-06 DIAGNOSIS — Z7982 Long term (current) use of aspirin: Secondary | ICD-10-CM | POA: Diagnosis not present

## 2019-04-06 DIAGNOSIS — C61 Malignant neoplasm of prostate: Secondary | ICD-10-CM

## 2019-04-06 DIAGNOSIS — Z9079 Acquired absence of other genital organ(s): Secondary | ICD-10-CM | POA: Diagnosis not present

## 2019-04-06 DIAGNOSIS — I10 Essential (primary) hypertension: Secondary | ICD-10-CM | POA: Diagnosis not present

## 2019-04-06 LAB — PROSTATE-SPECIFIC AG, SERUM (LABCORP): Prostate Specific Ag, Serum: <0.1 ng/mL (ref 0.0–4.0)

## 2019-04-06 MED ORDER — DENOSUMAB 120 MG/1.7ML ~~LOC~~ SOLN
120.0000 mg | Freq: Once | SUBCUTANEOUS | Status: AC
  Administered 2019-04-06: 15:00:00 120 mg via SUBCUTANEOUS

## 2019-04-06 MED ORDER — DENOSUMAB 120 MG/1.7ML ~~LOC~~ SOLN
SUBCUTANEOUS | Status: AC
  Filled 2019-04-06: qty 1.7

## 2019-04-06 NOTE — Progress Notes (Signed)
Hematology and Oncology Follow Up Visit  Philip [name] [id number] February 06, 1946 74 y.o. 04/06/2019 2:40 PM Philip [name], MDJones, [name], MD   Principle Diagnosis: 74 year old man with advanced prostate cancer with disease to the bone and lymphadenopathy diagnosed in 2016.  He has castration-resistant after presenting with PSA was 2900 at that time.  Prior Therapy: Status post orchiectomy done on November 2016.  Taxotere chemotherapy at 75 mg/m to start on 02/16/2015. He is completed 6 cycles of therapy in March 2017.  Current therapy: Zytiga 1000 mg daily started in July 2017.  Prednisone was discontinued for better tolerance.  Interim History:  [name] returns today for repeat evaluation.  Since the last visit, he reports no major changes in his health.  He had dental follow-up regarding his surgery and his gum is completely healed.  He denies any mouth pain or discomfort.  He denies any bone pain or pathological fractures.  His performance status quality of life remain excellent at this time.   Medications: Updated without changes. Current Outpatient Medications  Medication Sig Dispense Refill  . atorvastatin (LIPITOR) 10 MG tablet TAKE 1 TABLET BY MOUTH EVERY DAY 90 tablet 0  . abiraterone acetate (ZYTIGA) 250 MG tablet TAKE 4 TABLETS BY MOUTH ONCE DAILY AS DIRECTED.  TAKE 1 HOUR BEFORE OR 2 HOURS AFTER A MEAL 120 tablet 11  . aspirin EC 81 MG tablet Take 81 mg by mouth daily.    Marland Kitchen azelastine (ASTELIN) 0.1 % nasal spray PLACE 1 SPRAY INTO BOTH NOSTRILS 2 (TWO) TIMES DAILY. USE IN EACH NOSTRIL AS DIRECTED 30 mL 1  . BIOTIN 5000 PO Take 1 tablet by mouth daily.    . Calcium Carbonate-Vitamin D3 (CALCIUM 600+D3) 600-400 MG-UNIT TABS Take 1 tablet by mouth daily.    . Denosumab (XGEVA ) Inject into the skin.    . fexofenadine (ALLEGRA) 180 MG tablet Take 180 mg by mouth daily.    . fluticasone (FLONASE) 50 MCG/ACT nasal spray SPRAY 2 SPRAYS INTO EACH NOSTRIL EVERY DAY 48 mL 2  .  hydrOXYzine (ATARAX/VISTARIL) 10 MG tablet 1 TABLET BY MOUTH EVERY 8 HOURS AS NEEDED    . lidocaine-prilocaine (EMLA) cream Apply 1 application topically as needed. 30 g 0  . LORazepam (ATIVAN) 1 MG tablet Take 1 tablet (1 mg total) by mouth every 8 (eight) hours as needed for anxiety. 30 tablet 0  . Multiple Vitamin (MULTIVITAMIN) tablet Take 1 tablet by mouth daily.    Marland Kitchen olmesartan (BENICAR) 20 MG tablet TAKE 1 TABLET BY MOUTH EVERY DAY 90 tablet 0   No current facility-administered medications for this visit.     Allergies:  Allergies  Allergen Reactions  . Prednisone Anxiety      Physical Exam:   Blood pressure (!) 160/93, pulse 82, temperature 98 F (36.7 C), temperature source Temporal, resp. rate 17, height 6\' 4"  (1.93 m), weight 199 lb 14.4 oz (90.7 kg), SpO2 100 %.          ECOG: 0    General appearance: Comfortable appearing without any discomfort Head: Normocephalic without any trauma Oropharynx: Mucous membranes are moist and pink without any thrush or ulcers. Eyes: Pupils are equal and round reactive to light. Lymph nodes: No cervical, supraclavicular, inguinal or axillary lymphadenopathy.   Heart:regular rate and rhythm.  S1 and S2 without leg edema. Lung: Clear without any rhonchi or wheezes.  No dullness to percussion. Abdomin: Soft, nontender, nondistended with good bowel sounds.  No hepatosplenomegaly. Musculoskeletal: No  joint deformity or effusion.  Full range of motion noted. Neurological: No deficits noted on motor, sensory and deep tendon reflex exam. Skin: No petechial rash or dryness.  Appeared moist.              Lab Results: Lab Results  Component Value Date   WBC 5.6 04/05/2019   HGB 12.3 (L) 04/05/2019   HCT 37.4 (L) 04/05/2019   MCV 93.7 04/05/2019   PLT 202 04/05/2019     Chemistry      Component Value Date/Time   NA 141 04/05/2019 1127   NA 139 02/10/2017 1323   K 3.9 04/05/2019 1127   K 3.4 (L) 02/10/2017 1323    CL 107 04/05/2019 1127   CO2 25 04/05/2019 1127   CO2 23 02/10/2017 1323   BUN 19 04/05/2019 1127   BUN 13.6 02/10/2017 1323   CREATININE 1.01 04/05/2019 1127   CREATININE 0.9 02/10/2017 1323      Component Value Date/Time   CALCIUM 9.6 04/05/2019 1127   CALCIUM 9.2 02/10/2017 1323   ALKPHOS 106 04/05/2019 1127   ALKPHOS 50 02/10/2017 1323   AST 28 04/05/2019 1127   AST 20 02/10/2017 1323   ALT 16 04/05/2019 1127   ALT 15 02/10/2017 1323   BILITOT 0.5 04/05/2019 1127   BILITOT 0.52 02/10/2017 1323        Results for QUEVON, SEIDL (MRN [id number]) as of 04/06/2019 14:41  Ref. Range 04/05/2019 11:27  Prostate Specific Ag, Serum Latest Ref Range: 0.0 - 4.0 ng/mL <0.1            Impression and Plan:  74 year old man with:  1.  Advanced prostate cancer with disease to the bone and lymphadenopathy diagnosed in 2016.  He has castration-resistant currently.  He remains on Zytiga with excellent PSA and radiographic response at this time.  Risks and benefits of continuing this therapy and long-term complications were reviewed.  These complications include adrenal insufficiency, hypertension and edema were reviewed.  Alternative options including systemic chemotherapy standing among others were also reiterated.  He is agreeable to continue at this time.   2. IV access: Port-A-Cath will be flushed periodically.  3. Hypertension: Blood pressure remains manageable between visits.  Despite elevation in his blood pressure today, no adjustment in his antihypertensive medication needed.  4.  Androgen depravation: Testosterone remains at a castrate level with history of orchiectomy.  No additional androgen deprivation is needed.  5.  Bone directed therapy: He has received Xgeva in the past but has been on hold because of dental issues.  Risks and benefits of restarting this therapy including osteonecrosis of the jaw and hypocalcemia were reiterated.  He is agreeable to proceed.  6.   Prognosis: Therapy remains palliative at this time although aggressive measures are warranted given his reasonable performance status.  7. Follow-up: In 2 months for a follow-up visit.  30 minutes was dedicated to this encounter.  The time was spent on reviewing his disease status, reviewing laboratory data, treatment options and addressing complications of therapy.  Zola Button, MD 2/3/20212:40 PM

## 2019-04-06 NOTE — Patient Instructions (Signed)

## 2019-04-07 ENCOUNTER — Telehealth: Payer: Self-pay | Admitting: Oncology

## 2019-04-07 NOTE — Telephone Encounter (Signed)
Scheduled appt per 2/3 los.  Sent a message to HIM pool to get a calendar mailed out. 

## 2019-04-10 ENCOUNTER — Ambulatory Visit: Payer: Medicare Other | Attending: Internal Medicine

## 2019-04-10 DIAGNOSIS — Z23 Encounter for immunization: Secondary | ICD-10-CM | POA: Insufficient documentation

## 2019-04-10 NOTE — Progress Notes (Signed)
   Covid-19 Vaccination Clinic  Name:  Philip [name]    MRN: [id number] DOB: Jan 09, 1946  04/10/2019  [name] was observed post Covid-19 immunization for 15 minutes without incidence. He was provided with Vaccine Information Sheet and instruction to access the V-Safe system.   [name] was instructed to call 911 with any severe reactions post vaccine: Marland Kitchen Difficulty breathing  . Swelling of your face and throat  . A fast heartbeat  . A bad rash all over your body  . Dizziness and weakness    Immunizations Administered    Name Date Dose VIS Date Route   Pfizer COVID-19 Vaccine 04/10/2019  8:28 AM 0.3 mL 02/11/2019 Intramuscular   Manufacturer: [place]   Lot: [id number]   [place]: [id number]

## 2019-04-18 ENCOUNTER — Other Ambulatory Visit: Payer: Self-pay | Admitting: Oncology

## 2019-04-23 ENCOUNTER — Ambulatory Visit: Payer: Medicare Other

## 2019-05-04 ENCOUNTER — Ambulatory Visit: Payer: Medicare Other | Attending: Internal Medicine

## 2019-05-04 DIAGNOSIS — Z23 Encounter for immunization: Secondary | ICD-10-CM | POA: Insufficient documentation

## 2019-05-04 NOTE — Progress Notes (Signed)
   Covid-19 Vaccination Clinic  Name:  Philip [name]    MRN: [id number] DOB: [birth year]  05/04/2019  Philip [name] was observed post Covid-19 immunization for 15 minutes without incident. He was provided with Vaccine Information Sheet and instruction to access the V-Safe system.   Philip [name] was instructed to call 911 with any severe reactions post vaccine: Marland Kitchen Difficulty breathing  . Swelling of face and throat  . A fast heartbeat  . A bad rash all over body  . Dizziness and weakness   Immunizations Administered    Name Date Dose VIS Date Route   Pfizer COVID-19 Vaccine 05/04/2019  2:52 PM 0.3 mL 02/11/2019 Intramuscular   Manufacturer: [place]   Lot: [id number]   [place]: [id number]

## 2019-05-09 ENCOUNTER — Ambulatory Visit (INDEPENDENT_AMBULATORY_CARE_PROVIDER_SITE_OTHER): Payer: Medicare Other | Admitting: Internal Medicine

## 2019-05-09 ENCOUNTER — Encounter: Payer: Self-pay | Admitting: Internal Medicine

## 2019-05-09 ENCOUNTER — Other Ambulatory Visit: Payer: Self-pay

## 2019-05-09 VITALS — BP 148/88 | HR 64 | Temp 97.6°F | Resp 16 | Ht 76.0 in | Wt 195.0 lb

## 2019-05-09 DIAGNOSIS — D539 Nutritional anemia, unspecified: Secondary | ICD-10-CM | POA: Diagnosis not present

## 2019-05-09 DIAGNOSIS — E785 Hyperlipidemia, unspecified: Secondary | ICD-10-CM

## 2019-05-09 DIAGNOSIS — I10 Essential (primary) hypertension: Secondary | ICD-10-CM | POA: Diagnosis not present

## 2019-05-09 LAB — CBC WITH DIFFERENTIAL/PLATELET
Basophils Absolute: 0 10*3/uL (ref 0.0–0.1)
Basophils Relative: 0.5 % (ref 0.0–3.0)
Eosinophils Absolute: 0.1 10*3/uL (ref 0.0–0.7)
Eosinophils Relative: 2.1 % (ref 0.0–5.0)
HCT: 40.8 % (ref 39.0–52.0)
Hemoglobin: 13.8 g/dL (ref 13.0–17.0)
Lymphocytes Relative: 31.3 % (ref 12.0–46.0)
Lymphs Abs: 1.7 10*3/uL (ref 0.7–4.0)
MCHC: 33.9 g/dL (ref 30.0–36.0)
MCV: 92.2 fl (ref 78.0–100.0)
Monocytes Absolute: 0.5 10*3/uL (ref 0.1–1.0)
Monocytes Relative: 8.8 % (ref 3.0–12.0)
Neutro Abs: 3.1 10*3/uL (ref 1.4–7.7)
Neutrophils Relative %: 57.3 % (ref 43.0–77.0)
Platelets: 222 10*3/uL (ref 150.0–400.0)
RBC: 4.42 Mil/uL (ref 4.22–5.81)
RDW: 13.3 % (ref 11.5–15.5)
WBC: 5.4 10*3/uL (ref 4.0–10.5)

## 2019-05-09 LAB — FOLATE: Folate: >23.7 ng/mL (ref >5.9)

## 2019-05-09 LAB — LIPID PANEL
Cholesterol: 211 mg/dL — ABNORMAL HIGH (ref 0–200)
HDL: 69.3 mg/dL (ref >39.00)
NonHDL: 142.18
Total CHOL/HDL Ratio: 3
Triglycerides: 313 mg/dL — ABNORMAL HIGH (ref 0.0–149.0)
VLDL: 62.6 mg/dL — ABNORMAL HIGH (ref 0.0–40.0)

## 2019-05-09 LAB — LDL CHOLESTEROL, DIRECT: Direct LDL: 97 mg/dL

## 2019-05-09 LAB — IBC PANEL
Iron: 81 ug/dL (ref 42–165)
Saturation Ratios: 21.4 % (ref 20.0–50.0)
Transferrin: 270 mg/dL (ref 212.0–360.0)

## 2019-05-09 LAB — VITAMIN B12: Vitamin B-12: 602 pg/mL (ref 211–911)

## 2019-05-09 LAB — FERRITIN: Ferritin: 126.6 ng/mL (ref 22.0–322.0)

## 2019-05-09 LAB — TSH: TSH: 1.4 u[IU]/mL (ref 0.35–4.50)

## 2019-05-09 MED ORDER — OLMESARTAN MEDOXOMIL 40 MG PO TABS: 40.0000 mg | ORAL_TABLET | Freq: Every day | ORAL | 1 refills | Status: DC

## 2019-05-09 NOTE — Patient Instructions (Signed)

## 2019-05-09 NOTE — Progress Notes (Signed)
Subjective:  Patient ID: Philip [name], male    DOB: 02-14-1946  Age: 74 y.o. MRN: [id number]  CC: Hyperlipidemia, Hypertension, and Anemia  This visit occurred during the SARS-CoV-2 public health emergency.  Safety protocols were in place, including screening questions prior to the visit, additional usage of staff PPE, and extensive cleaning of exam room while observing appropriate contact time as indicated for disinfecting solutions.    HPI Philip [name] presents for f/up - He recently had lab work done elsewhere and was found to be anemic.  He does not have any symptoms associated with this.  He feels like his blood pressure is well controlled.  He is very active and denies any recent episodes of CP, DOE, palpitations, edema, or fatigue.  He walks about 4 miles a day.  Outpatient Medications Prior to Visit  Medication Sig Dispense Refill  . aspirin EC 81 MG tablet Take 81 mg by mouth daily.    Marland Kitchen atorvastatin (LIPITOR) 10 MG tablet TAKE 1 TABLET BY MOUTH EVERY DAY 90 tablet 0  . azelastine (ASTELIN) 0.1 % nasal spray PLACE 1 SPRAY INTO BOTH NOSTRILS 2 (TWO) TIMES DAILY. USE IN EACH NOSTRIL AS DIRECTED 30 mL 1  . BIOTIN 5000 PO Take 1 tablet by mouth daily.    . Calcium Carbonate-Vitamin D3 (CALCIUM 600+D3) 600-400 MG-UNIT TABS Take 1 tablet by mouth daily.    . Denosumab (XGEVA [place]) Inject into the skin.    . fexofenadine (ALLEGRA) 180 MG tablet Take 180 mg by mouth daily.    . fluticasone (FLONASE) 50 MCG/ACT nasal spray SPRAY 2 SPRAYS INTO EACH NOSTRIL EVERY DAY 48 mL 2  . hydrOXYzine (ATARAX/VISTARIL) 10 MG tablet 1 TABLET BY MOUTH EVERY 8 HOURS AS NEEDED    . lidocaine-prilocaine (EMLA) cream Apply 1 application topically as needed. 30 g 0  . LORazepam (ATIVAN) 1 MG tablet Take 1 tablet (1 mg total) by mouth every 8 (eight) hours as needed for anxiety. 30 tablet 0  . Multiple Vitamin (MULTIVITAMIN) tablet Take 1 tablet by mouth daily.    Marland Kitchen ZYTIGA 250 MG tablet TAKE 4 TABLETS BY MOUTH ONCE  DAILY AS DIRECTED.  TAKE 1 HOUR BEFORE OR 2 HOURS AFTER A MEAL 120 tablet 0  . olmesartan (BENICAR) 20 MG tablet TAKE 1 TABLET BY MOUTH EVERY DAY 90 tablet 0   No facility-administered medications prior to visit.    ROS Review of Systems  Constitutional: Negative for diaphoresis, fatigue and unexpected weight change.  HENT: Negative.   Eyes: Negative for visual disturbance.  Respiratory: Negative for cough, chest tightness, shortness of breath and wheezing.   Cardiovascular: Negative for chest pain, palpitations and leg swelling.  Gastrointestinal: Negative for abdominal pain, blood in stool, constipation, diarrhea, nausea and vomiting.  Endocrine: Negative.   Genitourinary: Negative.  Negative for difficulty urinating, flank pain and hematuria.  Musculoskeletal: Negative for arthralgias and myalgias.  Skin: Negative.  Negative for color change and pallor.  Neurological: Negative.  Negative for dizziness, weakness, light-headedness, numbness and headaches.  Hematological: Negative for adenopathy. Does not bruise/bleed easily.  Psychiatric/Behavioral: Negative.     Objective:  BP (!) 148/88 (BP Location: Right Arm, Patient Position: Sitting, Cuff Size: Normal)   Pulse 64   Temp 97.6 F (36.4 C) (Oral)   Resp 16   Ht 6\' 4"  (1.93 m)   Wt 195 lb (88.5 kg)   SpO2 98%   BMI 23.74 kg/m   BP Readings from Last 3 Encounters:  05/09/19 Marland Kitchen)  148/88  04/06/19 (!) 160/93  02/10/19 (!) 175/87    Wt Readings from Last 3 Encounters:  05/09/19 195 lb (88.5 kg)  04/06/19 199 lb 14.4 oz (90.7 kg)  02/10/19 197 lb 3.2 oz (89.4 kg)    Physical Exam Vitals reviewed.  Constitutional:      Appearance: Normal appearance.  HENT:     Nose: Nose normal.     Mouth/Throat:     Mouth: Mucous membranes are moist.  Eyes:     General: No scleral icterus.    Conjunctiva/sclera: Conjunctivae normal.  Cardiovascular:     Rate and Rhythm: Normal rate and regular rhythm.     Heart sounds: No  murmur.  Pulmonary:     Effort: Pulmonary effort is normal.     Breath sounds: No stridor. No wheezing, rhonchi or rales.  Abdominal:     General: Abdomen is flat. Bowel sounds are normal. There is no distension.     Palpations: Abdomen is soft. There is no hepatomegaly, splenomegaly or mass.     Tenderness: There is no abdominal tenderness.  Musculoskeletal:        General: Normal range of motion.     Cervical back: Neck supple.     Right lower leg: No edema.     Left lower leg: No edema.  Lymphadenopathy:     Cervical: No cervical adenopathy.  Skin:    General: Skin is warm and dry.     Coloration: Skin is not pale.  Neurological:     General: No focal deficit present.     Mental Status: He is alert.  Psychiatric:        Mood and Affect: Mood normal.        Behavior: Behavior normal.     Lab Results  Component Value Date   WBC 5.4 05/09/2019   HGB 13.8 05/09/2019   HCT 40.8 05/09/2019   PLT 222.0 05/09/2019   GLUCOSE 96 04/05/2019   CHOL 211 (H) 05/09/2019   TRIG 313.0 (H) 05/09/2019   HDL 69.30 05/09/2019   LDLDIRECT 97.0 05/09/2019   ALT 16 04/05/2019   AST 28 04/05/2019   NA 141 04/05/2019   K 3.9 04/05/2019   CL 107 04/05/2019   CREATININE 1.01 04/05/2019   BUN 19 04/05/2019   CO2 25 04/05/2019   TSH 1.40 05/09/2019   PSA 0.17 03/29/2018   INR 1.06 02/09/2015    NM Bone Scan Whole Body  Result Date: 02/04/2019 CLINICAL DATA:  Follow-up metastatic prostate cancer. EXAM: NUCLEAR MEDICINE WHOLE BODY BONE SCAN TECHNIQUE: Whole body anterior and posterior images were obtained approximately 3 hours after intravenous injection of radiopharmaceutical. RADIOPHARMACEUTICALS:  20.1 mCi Technetium-86m MDP IV COMPARISON:  CT scan 02/03/2019 and prior whole-body bone scans from 06/19/2017 and 03/04/2016 FINDINGS: Unfortunately, significant progression of osseous metastatic disease involving the axial and appendicular skeleton. There are numerous lesions demonstrating  marked increased uptake which represents a definite change from the most recent bone scan. This appears more similar to the bone scan from 2018. There are numerous skull lesions, rib lesions, spine lesions and pelvic lesions. There are large lesions involving the hips bilaterally and a lesion in the left mid femur. Comparing the CT scans from 02/03/2019 and 02/19/2018 on bone windows I think overall there has been a definite decrease in the amount of sclerosis involving the bone lesions. For instance, the large left hip lesion previously measured 1,109 Hounsfield units and now measures 583 Hounsfield units. The right femoral neck lesion  previously measured 1,117 Hounsfield units and now measures 630 Hounsfield units. IMPRESSION: Diffuse bone lesions involving the axial and appendicular skeleton with significant abnormal uptake representing a definite change from the most recent bone scan from 02/19/2018. Findings consistent with recurrent disease. Electronically Signed   By: Marijo Sanes M.D.   On: 02/04/2019 05:46   CT Abdomen Pelvis W Contrast  Result Date: 02/03/2019 CLINICAL DATA:  Prostate cancer. EXAM: CT ABDOMEN AND PELVIS WITH CONTRAST TECHNIQUE: Multidetector CT imaging of the abdomen and pelvis was performed using the standard protocol following bolus administration of intravenous contrast. CONTRAST:  12mL OMNIPAQUE IOHEXOL 300 MG/ML  SOLN COMPARISON:  02/19/2018 FINDINGS: Lower chest: Unremarkable. Hepatobiliary: Tiny hypodensity in the medial stat scattered tiny hypodensities in the liver parenchyma are stable, compatible with benign etiology such as cysts. There is no evidence for gallstones, gallbladder wall thickening, or pericholecystic fluid. No intrahepatic or extrahepatic biliary dilation. Pancreas: No focal mass lesion. No dilatation of the main duct. No intraparenchymal cyst. No peripancreatic edema. Spleen: No splenomegaly. No focal mass lesion. Adrenals/Urinary Tract: No adrenal nodule  or mass. Stable bilateral renal cysts. Kidneys otherwise unremarkable. No evidence for hydroureter. The urinary bladder appears normal for the degree of distention. Stomach/Bowel: Stomach is unremarkable. No gastric wall thickening. No evidence of outlet obstruction. Duodenum is normally positioned as is the ligament of Treitz. No small bowel wall thickening. No small bowel dilatation. The terminal ileum is normal. The appendix is normal. No gross colonic mass. No colonic wall thickening. Vascular/Lymphatic: There is abdominal aortic atherosclerosis without aneurysm. There is no gastrohepatic or hepatoduodenal ligament lymphadenopathy. No intraperitoneal or retroperitoneal lymphadenopathy. No pelvic sidewall lymphadenopathy. Reproductive: The prostate gland and seminal vesicles are unremarkable. Other: No intraperitoneal free fluid. Musculoskeletal: Similar appearance of the widespread bony metastatic disease. IMPRESSION: 1. No substantial interval change in exam. No evidence for abdominopelvic lymphadenopathy. 2. Similar appearance widespread sclerotic osseous lesions, compatible with treated metastases. 3. Hepatic and bilateral renal cysts. 4.  Aortic Atherosclerois (ICD10-170.0) Electronically Signed   By: Misty Stanley M.D.   On: 02/03/2019 12:49    Assessment & Plan:   Naftuli was seen today for hyperlipidemia, hypertension and anemia.  Diagnoses and all orders for this visit:  Essential hypertension- His blood pressure is not adequately well controlled.  Recent electrolytes and renal function were normal.  I recommended that he double the dose of olmesartan. -     olmesartan (BENICAR) 40 MG tablet; Take 1 tablet (40 mg total) by mouth daily.  Hyperlipidemia LDL goal <100- He has achieved his LDL goal and is doing well on the statin. -     Lipid panel -     TSH  Deficiency anemia- His H&H are normal now.  His vitamin levels are normal.  LDH is normal which is reassuring that there is no hemolysis.   His reticulocyte count is normal as well.  Will continue to monitor this. -     CBC with Differential/Platelet -     IBC panel -     Folate -     Ferritin -     Reticulocytes -     Vitamin B1 -     Vitamin B12 -     Lactate dehydrogenase  Other orders -     LDL cholesterol, direct   I have discontinued Philip [name]'s olmesartan. I am also having him start on olmesartan. Additionally, I am having him maintain his fexofenadine, Calcium Carbonate-Vitamin D3, BIOTIN 5000 PO, Denosumab (XGEVA [place]), aspirin  EC, multivitamin, hydrOXYzine, LORazepam, azelastine, fluticasone, lidocaine-prilocaine, atorvastatin, and Zytiga.  Meds ordered this encounter  Medications  . olmesartan (BENICAR) 40 MG tablet    Sig: Take 1 tablet (40 mg total) by mouth daily.    Dispense:  90 tablet    Refill:  1     Follow-up: Return in about 6 months (around 11/09/2019).  Scarlette Calico, MD

## 2019-05-10 ENCOUNTER — Encounter: Payer: Self-pay | Admitting: Internal Medicine

## 2019-05-11 ENCOUNTER — Encounter: Payer: Self-pay | Admitting: Internal Medicine

## 2019-05-13 LAB — VITAMIN B1: Vitamin B1 (Thiamine): 44 nmol/L — ABNORMAL HIGH (ref 8–30)

## 2019-05-13 LAB — LACTATE DEHYDROGENASE: LDH: 158 U/L (ref 120–250)

## 2019-05-13 LAB — RETICULOCYTES
ABS Retic: 54360 cells/uL (ref 25000–9000)
Retic Ct Pct: 1.2 %

## 2019-05-18 ENCOUNTER — Other Ambulatory Visit: Payer: Self-pay | Admitting: Oncology

## 2019-05-31 ENCOUNTER — Inpatient Hospital Stay: Payer: Medicare Other | Attending: Oncology

## 2019-05-31 ENCOUNTER — Other Ambulatory Visit: Payer: Self-pay

## 2019-05-31 ENCOUNTER — Inpatient Hospital Stay: Payer: Medicare Other

## 2019-05-31 DIAGNOSIS — C7951 Secondary malignant neoplasm of bone: Secondary | ICD-10-CM | POA: Insufficient documentation

## 2019-05-31 DIAGNOSIS — C61 Malignant neoplasm of prostate: Secondary | ICD-10-CM | POA: Diagnosis not present

## 2019-05-31 DIAGNOSIS — Z452 Encounter for adjustment and management of vascular access device: Secondary | ICD-10-CM | POA: Insufficient documentation

## 2019-05-31 DIAGNOSIS — Z95828 Presence of other vascular implants and grafts: Secondary | ICD-10-CM

## 2019-05-31 LAB — CMP (CANCER CENTER ONLY)
ALT: 14 U/L (ref 0–44)
AST: 19 U/L (ref 15–41)
Albumin: 3.9 g/dL (ref 3.5–5.0)
Alkaline Phosphatase: 63 U/L (ref 38–126)
Anion gap: 8 (ref 5–15)
BUN: 14 mg/dL (ref 8–23)
CO2: 24 mmol/L (ref 22–32)
Calcium: 9.5 mg/dL (ref 8.9–10.3)
Chloride: 109 mmol/L (ref 98–111)
Creatinine: 0.85 mg/dL (ref 0.61–1.24)
GFR, Est AFR Am: >60 mL/min (ref >60)
GFR, Estimated: >60 mL/min (ref >60)
Glucose, Bld: 89 mg/dL (ref 70–99)
Potassium: 4 mmol/L (ref 3.5–5.1)
Sodium: 141 mmol/L (ref 135–145)
Total Bilirubin: 0.7 mg/dL (ref 0.3–1.2)
Total Protein: 6.7 g/dL (ref 6.5–8.1)

## 2019-05-31 LAB — CBC WITH DIFFERENTIAL (CANCER CENTER ONLY)
Abs Immature Granulocytes: 0.01 10*3/uL (ref 0.00–0.07)
Basophils Absolute: 0 10*3/uL (ref 0.0–0.1)
Basophils Relative: 1 %
Eosinophils Absolute: 0.1 10*3/uL (ref 0.0–0.5)
Eosinophils Relative: 1 %
HCT: 41.3 % (ref 39.0–52.0)
Hemoglobin: 13.4 g/dL (ref 13.0–17.0)
Immature Granulocytes: 0 %
Lymphocytes Relative: 36 %
Lymphs Abs: 1.9 10*3/uL (ref 0.7–4.0)
MCH: 30.2 pg (ref 26.0–34.0)
MCHC: 32.4 g/dL (ref 30.0–36.0)
MCV: 93.2 fL (ref 80.0–100.0)
Monocytes Absolute: 0.5 10*3/uL (ref 0.1–1.0)
Monocytes Relative: 10 %
Neutro Abs: 2.8 10*3/uL (ref 1.7–7.7)
Neutrophils Relative %: 52 %
Platelet Count: 192 10*3/uL (ref 150–400)
RBC: 4.43 MIL/uL (ref 4.22–5.81)
RDW: 13.3 % (ref 11.5–15.5)
WBC Count: 5.4 10*3/uL (ref 4.0–10.5)
nRBC: 0 % (ref 0.0–0.2)

## 2019-05-31 MED ORDER — SODIUM CHLORIDE 0.9% FLUSH
10.0000 mL | Freq: Once | INTRAVENOUS | Status: AC
  Administered 2019-05-31: 12:00:00 10 mL
  Filled 2019-05-31: qty 10

## 2019-05-31 MED ORDER — HEPARIN SOD (PORK) LOCK FLUSH 100 UNIT/ML IV SOLN
500.0000 [IU] | Freq: Once | INTRAVENOUS | Status: AC
  Administered 2019-05-31: 500 [IU]
  Filled 2019-05-31: qty 5

## 2019-05-31 NOTE — Patient Instructions (Signed)

## 2019-06-01 ENCOUNTER — Encounter: Payer: Self-pay | Admitting: Internal Medicine

## 2019-06-01 LAB — PROSTATE-SPECIFIC AG, SERUM (LABCORP): Prostate Specific Ag, Serum: <0.1 ng/mL (ref 0.0–4.0)

## 2019-06-02 ENCOUNTER — Inpatient Hospital Stay (HOSPITAL_BASED_OUTPATIENT_CLINIC_OR_DEPARTMENT_OTHER): Payer: Medicare Other | Admitting: Oncology

## 2019-06-02 ENCOUNTER — Inpatient Hospital Stay: Payer: Medicare Other | Attending: Oncology

## 2019-06-02 ENCOUNTER — Other Ambulatory Visit: Payer: Self-pay

## 2019-06-02 ENCOUNTER — Other Ambulatory Visit: Payer: Self-pay | Admitting: Internal Medicine

## 2019-06-02 VITALS — BP 198/92 | HR 68 | Temp 98.5°F | Resp 20 | Ht 76.0 in | Wt 196.1 lb

## 2019-06-02 DIAGNOSIS — Z7982 Long term (current) use of aspirin: Secondary | ICD-10-CM | POA: Diagnosis not present

## 2019-06-02 DIAGNOSIS — I10 Essential (primary) hypertension: Secondary | ICD-10-CM | POA: Diagnosis not present

## 2019-06-02 DIAGNOSIS — Z9079 Acquired absence of other genital organ(s): Secondary | ICD-10-CM | POA: Insufficient documentation

## 2019-06-02 DIAGNOSIS — C7951 Secondary malignant neoplasm of bone: Secondary | ICD-10-CM | POA: Diagnosis not present

## 2019-06-02 DIAGNOSIS — C61 Malignant neoplasm of prostate: Secondary | ICD-10-CM | POA: Diagnosis not present

## 2019-06-02 DIAGNOSIS — Z79899 Other long term (current) drug therapy: Secondary | ICD-10-CM | POA: Insufficient documentation

## 2019-06-02 DIAGNOSIS — Z95828 Presence of other vascular implants and grafts: Secondary | ICD-10-CM

## 2019-06-02 MED ORDER — OLMESARTAN MEDOXOMIL 20 MG PO TABS: 20.0000 mg | ORAL_TABLET | Freq: Every day | ORAL | 1 refills | Status: DC

## 2019-06-02 MED ORDER — DENOSUMAB 120 MG/1.7ML ~~LOC~~ SOLN
SUBCUTANEOUS | Status: AC
  Filled 2019-06-02: qty 1.7

## 2019-06-02 MED ORDER — DENOSUMAB 120 MG/1.7ML ~~LOC~~ SOLN
120.0000 mg | Freq: Once | SUBCUTANEOUS | Status: AC
  Administered 2019-06-02: 120 mg via SUBCUTANEOUS

## 2019-06-02 NOTE — Progress Notes (Signed)
Hematology and Oncology Follow Up Visit  Philip [name] [id number] [birth year] 74 y.o. 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   Prior Therapy: Status post orchiectomy done on November 2016.  Taxotere chemotherapy at 75 mg/m to start on 02/16/2015. He is completed 6 cycles of therapy in March 2017.  Current therapy: Zytiga 1000 mg daily started in July 2017.  Prednisone was discontinued for better tolerance.  Interim History:  Mr. Haffey is here for a follow-up visit.  Since the last visit, he reports no major changes in his health.  He continues to tolerate Zytiga without any complaints.  He remains on Benicar 20 mg daily.  This was increased temporarily to 40 mg but did not tolerated.  He denies any bone pain, pathological fractures.  He denies any recent hospitalization or illnesses.  He denies any falls or syncope.   Medications: Updated without changes. Current Outpatient Medications  Medication Sig Dispense Refill  . aspirin EC 81 MG tablet Take 81 mg by mouth daily.    Marland Kitchen atorvastatin (LIPITOR) 10 MG tablet TAKE 1 TABLET BY MOUTH EVERY DAY 90 tablet 0  . azelastine (ASTELIN) 0.1 % nasal spray PLACE 1 SPRAY INTO BOTH NOSTRILS 2 (TWO) TIMES DAILY. USE IN EACH NOSTRIL AS DIRECTED 30 mL 1  . BIOTIN 5000 PO Take 1 tablet by mouth daily.    . Calcium Carbonate-Vitamin D3 (CALCIUM 600+D3) 600-400 MG-UNIT TABS Take 1 tablet by mouth daily.    . Denosumab (XGEVA [place]) Inject into the skin.    . fexofenadine (ALLEGRA) 180 MG tablet Take 180 mg by mouth daily.    . fluticasone (FLONASE) 50 MCG/ACT nasal spray SPRAY 2 SPRAYS INTO EACH NOSTRIL EVERY DAY 48 mL 2  . hydrOXYzine (ATARAX/VISTARIL) 10 MG tablet 1 TABLET BY MOUTH EVERY 8 HOURS AS NEEDED    . lidocaine-prilocaine (EMLA) cream Apply 1 application topically  as needed. 30 g 0  . LORazepam (ATIVAN) 1 MG tablet Take 1 tablet (1 mg total) by mouth every 8 (eight) hours as needed for anxiety. 30 tablet 0  . Multiple Vitamin (MULTIVITAMIN) tablet Take 1 tablet by mouth daily.    Marland Kitchen olmesartan (BENICAR) 20 MG tablet Take 1 tablet (20 mg total) by mouth daily. 90 tablet 1  . ZYTIGA 250 MG tablet TAKE 4 TABLETS BY MOUTH ONCE DAILY AS DIRECTED.  TAKE 1 HOUR BEFORE OR 2 HOURS AFTER A MEAL 120 tablet 10   No current facility-administered medications for this visit.   Facility-Administered Medications Ordered in Other Visits  Medication Dose Route Frequency Provider Last Rate Last Admin  . denosumab (XGEVA) injection 120 mg  120 mg Subcutaneous Once [name], MD         Allergies:  Allergies  Allergen Reactions  . Prednisone Anxiety      Physical Exam:   Blood pressure (!) 198/92, pulse 68, temperature 98.5 F (36.9 C), temperature source Temporal, resp. rate 20, height 6\' 4"  (1.93 m), weight 196 lb 1.6 oz (89 kg), SpO2 100 %.          ECOG: 0    General appearance: Alert, awake without any distress. Head: Atraumatic without abnormalities Oropharynx: Without any thrush or ulcers. Eyes: No scleral icterus. Lymph nodes: No lymphadenopathy noted in the cervical, supraclavicular, or axillary nodes Heart:regular rate and rhythm, without  any murmurs or gallops.   Lung: Clear to auscultation without any rhonchi, wheezes or dullness to percussion. Abdomin: Soft, nontender without any shifting dullness or ascites. Musculoskeletal: No clubbing or cyanosis. Neurological: No motor or sensory deficits. Skin: No rashes or lesions.             Lab Results: Lab Results  Component Value Date   WBC 5.4 05/31/2019   HGB 13.4 05/31/2019   HCT 41.3 05/31/2019   MCV 93.2 05/31/2019   PLT 192 05/31/2019     Chemistry      Component Value Date/Time   NA 141 05/31/2019 1140   NA 139 02/10/2017 1323   K 4.0 05/31/2019 1140    K 3.4 (L) 02/10/2017 1323   CL 109 05/31/2019 1140   CO2 24 05/31/2019 1140   CO2 23 02/10/2017 1323   BUN 14 05/31/2019 1140   BUN 13.6 02/10/2017 1323   CREATININE 0.85 05/31/2019 1140   CREATININE 0.9 02/10/2017 1323      Component Value Date/Time   CALCIUM 9.5 05/31/2019 1140   CALCIUM 9.2 02/10/2017 1323   ALKPHOS 63 05/31/2019 1140   ALKPHOS 50 02/10/2017 1323   AST 19 05/31/2019 1140   AST 20 02/10/2017 1323   ALT 14 05/31/2019 1140   ALT 15 02/10/2017 1323   BILITOT 0.7 05/31/2019 1140   BILITOT 0.52 02/10/2017 1323         Results for BENITO, LEVITIN (MRN [id number]) as of 06/02/2019 12:27  Ref. Range 04/05/2019 11:27 05/31/2019 11:40  Prostate Specific Ag, Serum Latest Ref Range: 0.0 - 4.0 ng/mL <0.1 <0.1            Impression and Plan:  74 year old man with:  1.  Castration-resistant prostate cancer with disease to the bone and lymphadenopathy since 2014.    He has tolerated Zytiga without any major complications since the last visit, his PSA continues to be undetectable and no evidence of progressive disease.  The natural course of this disease was reviewed at this time and future treatment options were discussed including salvage systemic chemotherapy.  At this time he is agreeable to continue given his excellent tolerance and excellent PSA control   2. IV access: Port-A-Cath remains in place and will be flushed every 6 to 8 weeks.  3. Hypertension: His blood pressure elevated today but under control between visits.  Continue to follow with Dr. Ronnald Ramp regarding this issue.  4.  Androgen depravation: He is status post orchiectomy with testosterone at castrate level.  No additional androgen deprivation needed.  5.  Bone directed therapy: He is currently on Xgeva which will continue at this time.  Complication bleeding hypocalcemia and osteonecrosis of the jaw were reiterated and he is agreeable to continue.  6.  Prognosis: Therapy remains palliative although  aggressive measures are warranted given his excellent performance status and excellent he has a response  7. Follow-up: In 6 to 8 weeks for repeat evaluation.  30 minutes were spent on this visit.  The time was was dedicated to reviewing the natural course of his disease, reviewing laboratory data, treatment options and future plan of care.  Zola Button, MD 4/1/202112:25 PM

## 2019-06-02 NOTE — Patient Instructions (Signed)
Denosumab injection What is this medicine? DENOSUMAB (den oh sue mab) slows bone breakdown. Prolia is used to treat osteoporosis in women after menopause and in men, and in people who are taking corticosteroids for 6 months or more. Xgeva is used to treat a high calcium level due to cancer and to prevent bone fractures and other bone problems caused by multiple myeloma or cancer bone metastases. Xgeva is also used to treat giant cell tumor of the bone. This medicine may be used for other purposes; ask your health care provider or pharmacist if you have questions. COMMON BRAND NAME(S): Prolia, XGEVA What should I tell my health care provider before I take this medicine? They need to know if you have any of these conditions:  dental disease  having surgery or tooth extraction  infection  kidney disease  low levels of calcium or Vitamin D in the blood  malnutrition  on hemodialysis  skin conditions or sensitivity  thyroid or parathyroid disease  an unusual reaction to denosumab, other medicines, foods, dyes, or preservatives  pregnant or trying to get pregnant  breast-feeding How should I use this medicine? This medicine is for injection under the skin. It is given by a health care professional in a hospital or clinic setting. A special MedGuide will be given to you before each treatment. Be sure to read this information carefully each time. For Prolia, talk to your pediatrician regarding the use of this medicine in children. Special care may be needed. For Xgeva, talk to your pediatrician regarding the use of this medicine in children. While this drug may be prescribed for children as young as 13 years for selected conditions, precautions do apply. Overdosage: If you think you have taken too much of this medicine contact a poison control center or emergency room at once. NOTE: This medicine is only for you. Do not share this medicine with others. What if I miss a dose? It is  important not to miss your dose. Call your doctor or health care professional if you are unable to keep an appointment. What may interact with this medicine? Do not take this medicine with any of the following medications:  other medicines containing denosumab This medicine may also interact with the following medications:  medicines that lower your chance of fighting infection  steroid medicines like prednisone or cortisone This list may not describe all possible interactions. Give your health care provider a list of all the medicines, herbs, non-prescription drugs, or dietary supplements you use. Also tell them if you smoke, drink alcohol, or use illegal drugs. Some items may interact with your medicine. What should I watch for while using this medicine? Visit your doctor or health care professional for regular checks on your progress. Your doctor or health care professional may order blood tests and other tests to see how you are doing. Call your doctor or health care professional for advice if you get a fever, chills or sore throat, or other symptoms of a cold or flu. Do not treat yourself. This drug may decrease your body's ability to fight infection. Try to avoid being around people who are sick. You should make sure you get enough calcium and vitamin D while you are taking this medicine, unless your doctor tells you not to. Discuss the foods you eat and the vitamins you take with your health care professional. See your dentist regularly. Brush and floss your teeth as directed. Before you have any dental work done, tell your dentist you are   receiving this medicine. Do not become pregnant while taking this medicine or for 5 months after stopping it. Talk with your doctor or health care professional about your birth control options while taking this medicine. Women should inform their doctor if they wish to become pregnant or think they might be pregnant. There is a potential for serious side  effects to an unborn child. Talk to your health care professional or pharmacist for more information. What side effects may I notice from receiving this medicine? Side effects that you should report to your doctor or health care professional as soon as possible:  allergic reactions like skin rash, itching or hives, swelling of the face, lips, or tongue  bone pain  breathing problems  dizziness  jaw pain, especially after dental work  redness, blistering, peeling of the skin  signs and symptoms of infection like fever or chills; cough; sore throat; pain or trouble passing urine  signs of low calcium like fast heartbeat, muscle cramps or muscle pain; pain, tingling, numbness in the hands or feet; seizures  unusual bleeding or bruising  unusually weak or tired Side effects that usually do not require medical attention (report to your doctor or health care professional if they continue or are bothersome):  constipation  diarrhea  headache  joint pain  loss of appetite  muscle pain  runny nose  tiredness  upset stomach This list may not describe all possible side effects. Call your doctor for medical advice about side effects. You may report side effects to FDA at 1-800-FDA-1088. Where should I keep my medicine? This medicine is only given in a clinic, doctor's office, or other health care setting and will not be stored at home. NOTE: This sheet is a summary. It may not cover all possible information. If you have questions about this medicine, talk to your doctor, pharmacist, or health care provider.  2020 [employer]vier/Gold Standard (2017-06-26 16:10:44)

## 2019-06-03 ENCOUNTER — Telehealth: Payer: Self-pay | Admitting: Oncology

## 2019-06-03 NOTE — Telephone Encounter (Signed)
Scheduled per los. Called and left msg. Mailed printout  °

## 2019-06-09 ENCOUNTER — Other Ambulatory Visit: Payer: Self-pay | Admitting: Oncology

## 2019-06-29 ENCOUNTER — Other Ambulatory Visit: Payer: Self-pay | Admitting: Internal Medicine

## 2019-06-29 DIAGNOSIS — I700 Atherosclerosis of aorta: Secondary | ICD-10-CM

## 2019-06-29 DIAGNOSIS — E785 Hyperlipidemia, unspecified: Secondary | ICD-10-CM

## 2019-07-20 ENCOUNTER — Inpatient Hospital Stay: Payer: Medicare Other | Attending: Oncology

## 2019-07-20 ENCOUNTER — Other Ambulatory Visit: Payer: Self-pay

## 2019-07-20 ENCOUNTER — Inpatient Hospital Stay: Payer: Medicare Other

## 2019-07-20 DIAGNOSIS — Z79899 Other long term (current) drug therapy: Secondary | ICD-10-CM | POA: Insufficient documentation

## 2019-07-20 DIAGNOSIS — H9319 Tinnitus, unspecified ear: Secondary | ICD-10-CM | POA: Diagnosis not present

## 2019-07-20 DIAGNOSIS — Z452 Encounter for adjustment and management of vascular access device: Secondary | ICD-10-CM | POA: Diagnosis not present

## 2019-07-20 DIAGNOSIS — C7951 Secondary malignant neoplasm of bone: Secondary | ICD-10-CM | POA: Diagnosis not present

## 2019-07-20 DIAGNOSIS — C61 Malignant neoplasm of prostate: Secondary | ICD-10-CM | POA: Diagnosis not present

## 2019-07-20 DIAGNOSIS — Z9079 Acquired absence of other genital organ(s): Secondary | ICD-10-CM | POA: Insufficient documentation

## 2019-07-20 DIAGNOSIS — I10 Essential (primary) hypertension: Secondary | ICD-10-CM | POA: Insufficient documentation

## 2019-07-20 DIAGNOSIS — Z7982 Long term (current) use of aspirin: Secondary | ICD-10-CM | POA: Insufficient documentation

## 2019-07-20 DIAGNOSIS — R42 Dizziness and giddiness: Secondary | ICD-10-CM | POA: Insufficient documentation

## 2019-07-20 DIAGNOSIS — Z95828 Presence of other vascular implants and grafts: Secondary | ICD-10-CM

## 2019-07-20 LAB — CBC WITH DIFFERENTIAL (CANCER CENTER ONLY)
Abs Immature Granulocytes: 0.01 10*3/uL (ref 0.00–0.07)
Basophils Absolute: 0 10*3/uL (ref 0.0–0.1)
Basophils Relative: 1 %
Eosinophils Absolute: 0.1 10*3/uL (ref 0.0–0.5)
Eosinophils Relative: 2 %
HCT: 39.5 % (ref 39.0–52.0)
Hemoglobin: 13 g/dL (ref 13.0–17.0)
Immature Granulocytes: 0 %
Lymphocytes Relative: 28 %
Lymphs Abs: 1.6 10*3/uL (ref 0.7–4.0)
MCH: 30.2 pg (ref 26.0–34.0)
MCHC: 32.9 g/dL (ref 30.0–36.0)
MCV: 91.9 fL (ref 80.0–100.0)
Monocytes Absolute: 0.5 10*3/uL (ref 0.1–1.0)
Monocytes Relative: 9 %
Neutro Abs: 3.4 10*3/uL (ref 1.7–7.7)
Neutrophils Relative %: 60 %
Platelet Count: 198 10*3/uL (ref 150–400)
RBC: 4.3 MIL/uL (ref 4.22–5.81)
RDW: 13.3 % (ref 11.5–15.5)
WBC Count: 5.6 10*3/uL (ref 4.0–10.5)
nRBC: 0 % (ref 0.0–0.2)

## 2019-07-20 LAB — CMP (CANCER CENTER ONLY)
ALT: 15 U/L (ref 0–44)
AST: 19 U/L (ref 15–41)
Albumin: 3.7 g/dL (ref 3.5–5.0)
Alkaline Phosphatase: 53 U/L (ref 38–126)
Anion gap: 9 (ref 5–15)
BUN: 19 mg/dL (ref 8–23)
CO2: 26 mmol/L (ref 22–32)
Calcium: 9.6 mg/dL (ref 8.9–10.3)
Chloride: 107 mmol/L (ref 98–111)
Creatinine: 1 mg/dL (ref 0.61–1.24)
GFR, Est AFR Am: >60 mL/min (ref >60)
GFR, Estimated: >60 mL/min (ref >60)
Glucose, Bld: 99 mg/dL (ref 70–99)
Potassium: 3.8 mmol/L (ref 3.5–5.1)
Sodium: 142 mmol/L (ref 135–145)
Total Bilirubin: 0.7 mg/dL (ref 0.3–1.2)
Total Protein: 6.6 g/dL (ref 6.5–8.1)

## 2019-07-20 MED ORDER — SODIUM CHLORIDE 0.9% FLUSH
10.0000 mL | Freq: Once | INTRAVENOUS | Status: AC
  Administered 2019-07-20: 10 mL
  Filled 2019-07-20: qty 10

## 2019-07-20 MED ORDER — HEPARIN SOD (PORK) LOCK FLUSH 100 UNIT/ML IV SOLN
500.0000 [IU] | Freq: Once | INTRAVENOUS | Status: AC
  Administered 2019-07-20: 500 [IU]
  Filled 2019-07-20: qty 5

## 2019-07-21 LAB — PROSTATE-SPECIFIC AG, SERUM (LABCORP): Prostate Specific Ag, Serum: <0.1 ng/mL (ref 0.0–4.0)

## 2019-07-22 ENCOUNTER — Inpatient Hospital Stay (HOSPITAL_BASED_OUTPATIENT_CLINIC_OR_DEPARTMENT_OTHER): Payer: Medicare Other | Admitting: Oncology

## 2019-07-22 ENCOUNTER — Other Ambulatory Visit: Payer: Self-pay

## 2019-07-22 ENCOUNTER — Inpatient Hospital Stay: Payer: Medicare Other

## 2019-07-22 VITALS — BP 186/86 | HR 69 | Temp 97.7°F | Resp 18 | Ht 76.0 in | Wt 191.4 lb

## 2019-07-22 DIAGNOSIS — C7951 Secondary malignant neoplasm of bone: Secondary | ICD-10-CM | POA: Diagnosis not present

## 2019-07-22 DIAGNOSIS — C61 Malignant neoplasm of prostate: Secondary | ICD-10-CM

## 2019-07-22 DIAGNOSIS — R42 Dizziness and giddiness: Secondary | ICD-10-CM

## 2019-07-22 DIAGNOSIS — Z9079 Acquired absence of other genital organ(s): Secondary | ICD-10-CM | POA: Diagnosis not present

## 2019-07-22 DIAGNOSIS — H9313 Tinnitus, bilateral: Secondary | ICD-10-CM | POA: Diagnosis not present

## 2019-07-22 DIAGNOSIS — H9319 Tinnitus, unspecified ear: Secondary | ICD-10-CM | POA: Diagnosis not present

## 2019-07-22 DIAGNOSIS — Z452 Encounter for adjustment and management of vascular access device: Secondary | ICD-10-CM | POA: Diagnosis not present

## 2019-07-22 DIAGNOSIS — Z95828 Presence of other vascular implants and grafts: Secondary | ICD-10-CM

## 2019-07-22 MED ORDER — DENOSUMAB 120 MG/1.7ML ~~LOC~~ SOLN
120.0000 mg | Freq: Once | SUBCUTANEOUS | Status: AC
  Administered 2019-07-22: 120 mg via SUBCUTANEOUS

## 2019-07-22 MED ORDER — DENOSUMAB 120 MG/1.7ML ~~LOC~~ SOLN
SUBCUTANEOUS | Status: AC
  Filled 2019-07-22: qty 1.7

## 2019-07-22 NOTE — Progress Notes (Signed)
Hematology and Oncology Follow Up Visit  Philip [name] [id number] 1945-04-09 74 y.o. 07/22/2019 12:19 PM [name], MDJones, [name], MD   Principle Diagnosis: 74 year old man with advanced prostate cancer with that disease to the bone and lymphadenopathy since 2016.  He has castration-resistant and presented at the time of diagnosis with PSA of 2900.  Prior Therapy: Status post orchiectomy done on November 2016.  Taxotere chemotherapy at 75 mg/m to start on 02/16/2015. He is completed 6 cycles of therapy in March 2017.  Current therapy: Zytiga 1000 mg daily started in July 2017.  Prednisone was discontinued for better tolerance.  Interim History:  Mr. Kiyabu returns today for a repeat evaluation.  Since the last visit, he reports no new complications related to [facility].  He denies any nausea, vomiting or abdominal pain.  He denies any lower extremity edema.  He did report an episode of vertigo associated with intermittent tinnitus.  He did have one episode where he had vertigo associated with nausea and vomiting and resolved spontaneously.  He feels reasonably well at this time without any other complaints.   Medications: Reviewed without changes. Current Outpatient Medications  Medication Sig Dispense Refill  . aspirin EC 81 MG tablet Take 81 mg by mouth daily.    Marland Kitchen atorvastatin (LIPITOR) 10 MG tablet TAKE 1 TABLET BY MOUTH EVERY DAY 90 tablet 1  . azelastine (ASTELIN) 0.1 % nasal spray PLACE 1 SPRAY INTO BOTH NOSTRILS 2 (TWO) TIMES DAILY. USE IN EACH NOSTRIL AS DIRECTED 30 mL 1  . BIOTIN 5000 PO Take 1 tablet by mouth daily.    . Calcium Carbonate-Vitamin D3 (CALCIUM 600+D3) 600-400 MG-UNIT TABS Take 1 tablet by mouth daily.    . Denosumab (XGEVA [place]) Inject into the skin.    . fexofenadine (ALLEGRA) 180 MG tablet Take 180 mg by mouth daily.    . fluticasone (FLONASE) 50 MCG/ACT nasal spray SPRAY 2 SPRAYS INTO EACH NOSTRIL EVERY DAY 48 mL 2  . hydrOXYzine (ATARAX/VISTARIL) 10 MG  tablet 1 TABLET BY MOUTH EVERY 8 HOURS AS NEEDED    . lidocaine-prilocaine (EMLA) cream Apply 1 application topically as needed. 30 g 0  . LORazepam (ATIVAN) 1 MG tablet TAKE 1 TABLET (1 MG TOTAL) BY MOUTH EVERY 8 (EIGHT) HOURS AS NEEDED FOR ANXIETY. 30 tablet 0  . Multiple Vitamin (MULTIVITAMIN) tablet Take 1 tablet by mouth daily.    Marland Kitchen olmesartan (BENICAR) 20 MG tablet Take 1 tablet (20 mg total) by mouth daily. 90 tablet 1  . ZYTIGA 250 MG tablet TAKE 4 TABLETS BY MOUTH ONCE DAILY AS DIRECTED.  TAKE 1 HOUR BEFORE OR 2 HOURS AFTER A MEAL 120 tablet 10   No current facility-administered medications for this visit.     Allergies:  Allergies  Allergen Reactions  . Prednisone Anxiety      Physical Exam:   Blood pressure (!) 186/86, pulse 69, temperature 97.7 F (36.5 C), temperature source Temporal, resp. rate 18, height 6\' 4"  (1.93 m), weight 191 lb 6.4 oz (86.8 kg), SpO2 100 %.          ECOG: 0     General appearance: Comfortable appearing without any discomfort Head: Normocephalic without any trauma Oropharynx: Mucous membranes are moist and pink without any thrush or ulcers. Eyes: Pupils are equal and round reactive to light. Lymph nodes: No cervical, supraclavicular, inguinal or axillary lymphadenopathy.   Heart:regular rate and rhythm.  S1 and S2 without leg edema. Lung: Clear without any rhonchi or  wheezes.  No dullness to percussion. Abdomin: Soft, nontender, nondistended with good bowel sounds.  No hepatosplenomegaly. Musculoskeletal: No joint deformity or effusion.  Full range of motion noted. Neurological: No deficits noted on motor, sensory and deep tendon reflex exam. Skin: No petechial rash or dryness.  Appeared moist.               Lab Results: Lab Results  Component Value Date   WBC 5.6 07/20/2019   HGB 13.0 07/20/2019   HCT 39.5 07/20/2019   MCV 91.9 07/20/2019   PLT 198 07/20/2019     Chemistry      Component Value Date/Time    NA 142 07/20/2019 1004   NA 139 02/10/2017 1323   K 3.8 07/20/2019 1004   K 3.4 (L) 02/10/2017 1323   CL 107 07/20/2019 1004   CO2 26 07/20/2019 1004   CO2 23 02/10/2017 1323   BUN 19 07/20/2019 1004   BUN 13.6 02/10/2017 1323   CREATININE 1.00 07/20/2019 1004   CREATININE 0.9 02/10/2017 1323      Component Value Date/Time   CALCIUM 9.6 07/20/2019 1004   CALCIUM 9.2 02/10/2017 1323   ALKPHOS 53 07/20/2019 1004   ALKPHOS 50 02/10/2017 1323   AST 19 07/20/2019 1004   AST 20 02/10/2017 1323   ALT 15 07/20/2019 1004   ALT 15 02/10/2017 1323   BILITOT 0.7 07/20/2019 1004   BILITOT 0.52 02/10/2017 1323          Results for BRIC, RARDIN (MRN [id number]) as of 07/22/2019 12:21  Ref. Range 05/31/2019 11:40 07/20/2019 10:04  Prostate Specific Ag, Serum Latest Ref Range: 0.0 - 4.0 ng/mL <0.1 <0.1            Impression and Plan:  74 year old man with:  1.  Advanced prostate cancer with disease to the bone and lymphadenopathy diagnosed in 2016.      He continues to enjoy excellent response to therapy with PSA currently undetectable.  The natural course of this disease was reviewed and alternative treatment options were discussed.  At this time he does not require any additional treatment given his excellent response.  He is willing to continue taking Zytiga for the time being.   2. IV access: Port-A-Cath will be flushed periodically every 6 to 8 weeks..  3. Hypertension: Elevated today but has been close to normal range between visits.  We will continue to monitor on Zytiga.  4.  Androgen depravation: No additional androgen deprivation is required.  He is status post orchiectomy..  5.  Bone directed therapy: I recommended continuing Xgeva at this time.  Complications including osteonecrosis of the jaw and hypocalcemia were reiterated.  6.  Prognosis: His disease is incurable although aggressive measures are warranted given his excellent performance status and excellent  response to therapy at this time.  7.  Tinnitus with occasional vertigo: Unrelated to his malignancy at this time.  I recommended ENT evaluation and will make the appropriate referral.  8. Follow-up: He will return in 2 months for a follow-up.  30 minutes were dedicated to this encounter.  The time was spent on reviewing his disease status, reviewing laboratory data as well as outlining future treatment options.  Zola Button, MD 5/21/202112:19 PM

## 2019-07-22 NOTE — Patient Instructions (Signed)
Denosumab injection What is this medicine? DENOSUMAB (den oh sue mab) slows bone breakdown. Prolia is used to treat osteoporosis in women after menopause and in men, and in people who are taking corticosteroids for 6 months or more. Xgeva is used to treat a high calcium level due to cancer and to prevent bone fractures and other bone problems caused by multiple myeloma or cancer bone metastases. Xgeva is also used to treat giant cell tumor of the bone. This medicine may be used for other purposes; ask your health care provider or pharmacist if you have questions. COMMON BRAND NAME(S): Prolia, XGEVA What should I tell my health care provider before I take this medicine? They need to know if you have any of these conditions:  dental disease  having surgery or tooth extraction  infection  kidney disease  low levels of calcium or Vitamin D in the blood  malnutrition  on hemodialysis  skin conditions or sensitivity  thyroid or parathyroid disease  an unusual reaction to denosumab, other medicines, foods, dyes, or preservatives  pregnant or trying to get pregnant  breast-feeding How should I use this medicine? This medicine is for injection under the skin. It is given by a health care professional in a hospital or clinic setting. A special MedGuide will be given to you before each treatment. Be sure to read this information carefully each time. For Prolia, talk to your pediatrician regarding the use of this medicine in children. Special care may be needed. For Xgeva, talk to your pediatrician regarding the use of this medicine in children. While this drug may be prescribed for children as young as 13 years for selected conditions, precautions do apply. Overdosage: If you think you have taken too much of this medicine contact a poison control center or emergency room at once. NOTE: This medicine is only for you. Do not share this medicine with others. What if I miss a dose? It is  important not to miss your dose. Call your doctor or health care professional if you are unable to keep an appointment. What may interact with this medicine? Do not take this medicine with any of the following medications:  other medicines containing denosumab This medicine may also interact with the following medications:  medicines that lower your chance of fighting infection  steroid medicines like prednisone or cortisone This list may not describe all possible interactions. Give your health care provider a list of all the medicines, herbs, non-prescription drugs, or dietary supplements you use. Also tell them if you smoke, drink alcohol, or use illegal drugs. Some items may interact with your medicine. What should I watch for while using this medicine? Visit your doctor or health care professional for regular checks on your progress. Your doctor or health care professional may order blood tests and other tests to see how you are doing. Call your doctor or health care professional for advice if you get a fever, chills or sore throat, or other symptoms of a cold or flu. Do not treat yourself. This drug may decrease your body's ability to fight infection. Try to avoid being around people who are sick. You should make sure you get enough calcium and vitamin D while you are taking this medicine, unless your doctor tells you not to. Discuss the foods you eat and the vitamins you take with your health care professional. See your dentist regularly. Brush and floss your teeth as directed. Before you have any dental work done, tell your dentist you are   receiving this medicine. Do not become pregnant while taking this medicine or for 5 months after stopping it. Talk with your doctor or health care professional about your birth control options while taking this medicine. Women should inform their doctor if they wish to become pregnant or think they might be pregnant. There is a potential for serious side  effects to an unborn child. Talk to your health care professional or pharmacist for more information. What side effects may I notice from receiving this medicine? Side effects that you should report to your doctor or health care professional as soon as possible:  allergic reactions like skin rash, itching or hives, swelling of the face, lips, or tongue  bone pain  breathing problems  dizziness  jaw pain, especially after dental work  redness, blistering, peeling of the skin  signs and symptoms of infection like fever or chills; cough; sore throat; pain or trouble passing urine  signs of low calcium like fast heartbeat, muscle cramps or muscle pain; pain, tingling, numbness in the hands or feet; seizures  unusual bleeding or bruising  unusually weak or tired Side effects that usually do not require medical attention (report to your doctor or health care professional if they continue or are bothersome):  constipation  diarrhea  headache  joint pain  loss of appetite  muscle pain  runny nose  tiredness  upset stomach This list may not describe all possible side effects. Call your doctor for medical advice about side effects. You may report side effects to FDA at 1-800-FDA-1088. Where should I keep my medicine? This medicine is only given in a clinic, doctor's office, or other health care setting and will not be stored at home. NOTE: This sheet is a summary. It may not cover all possible information. If you have questions about this medicine, talk to your doctor, pharmacist, or health care provider.  2020 [employer]vier/Gold Standard (2017-06-26 16:10:44)

## 2019-07-30 ENCOUNTER — Encounter (HOSPITAL_BASED_OUTPATIENT_CLINIC_OR_DEPARTMENT_OTHER): Payer: Self-pay | Admitting: Emergency Medicine

## 2019-07-30 ENCOUNTER — Other Ambulatory Visit: Payer: Self-pay

## 2019-07-30 ENCOUNTER — Emergency Department (HOSPITAL_BASED_OUTPATIENT_CLINIC_OR_DEPARTMENT_OTHER): Payer: Medicare Other

## 2019-07-30 ENCOUNTER — Emergency Department (HOSPITAL_BASED_OUTPATIENT_CLINIC_OR_DEPARTMENT_OTHER)
Admission: EM | Admit: 2019-07-30 | Discharge: 2019-07-30 | Disposition: A | Discharge status: Home / Self Care | Payer: Medicare Other | Attending: Emergency Medicine | Admitting: Emergency Medicine

## 2019-07-30 DIAGNOSIS — M25561 Pain in right knee: Secondary | ICD-10-CM | POA: Diagnosis not present

## 2019-07-30 DIAGNOSIS — Z79899 Other long term (current) drug therapy: Secondary | ICD-10-CM | POA: Insufficient documentation

## 2019-07-30 DIAGNOSIS — Z888 Allergy status to other drugs, medicaments and biological substances status: Secondary | ICD-10-CM | POA: Diagnosis not present

## 2019-07-30 DIAGNOSIS — Y999 Unspecified external cause status: Secondary | ICD-10-CM | POA: Diagnosis not present

## 2019-07-30 DIAGNOSIS — S80211A Abrasion, right knee, initial encounter: Secondary | ICD-10-CM | POA: Diagnosis not present

## 2019-07-30 DIAGNOSIS — S0121XA Laceration without foreign body of nose, initial encounter: Secondary | ICD-10-CM | POA: Diagnosis not present

## 2019-07-30 DIAGNOSIS — Z8546 Personal history of malignant neoplasm of prostate: Secondary | ICD-10-CM | POA: Diagnosis not present

## 2019-07-30 DIAGNOSIS — W101XXA Fall (on)(from) sidewalk curb, initial encounter: Secondary | ICD-10-CM | POA: Insufficient documentation

## 2019-07-30 DIAGNOSIS — S0990XA Unspecified injury of head, initial encounter: Secondary | ICD-10-CM | POA: Diagnosis not present

## 2019-07-30 DIAGNOSIS — Y9301 Activity, walking, marching and hiking: Secondary | ICD-10-CM | POA: Insufficient documentation

## 2019-07-30 DIAGNOSIS — Z7982 Long term (current) use of aspirin: Secondary | ICD-10-CM | POA: Diagnosis not present

## 2019-07-30 DIAGNOSIS — Y92480 Sidewalk as the place of occurrence of the external cause: Secondary | ICD-10-CM | POA: Diagnosis not present

## 2019-07-30 DIAGNOSIS — S8991XA Unspecified injury of right lower leg, initial encounter: Secondary | ICD-10-CM | POA: Diagnosis not present

## 2019-07-30 DIAGNOSIS — S0181XA Laceration without foreign body of other part of head, initial encounter: Secondary | ICD-10-CM | POA: Diagnosis not present

## 2019-07-30 DIAGNOSIS — E785 Hyperlipidemia, unspecified: Secondary | ICD-10-CM | POA: Insufficient documentation

## 2019-07-30 MED ORDER — LIDOCAINE HCL (PF) 1 % IJ SOLN
10.0000 mL | Freq: Once | INTRAMUSCULAR | Status: AC
  Administered 2019-07-30: 10 mL
  Filled 2019-07-30: qty 10

## 2019-07-30 MED ORDER — BACITRACIN ZINC 500 UNIT/GM EX OINT
TOPICAL_OINTMENT | Freq: Once | CUTANEOUS | Status: AC
  Filled 2019-07-30: qty 28.35

## 2019-07-30 NOTE — Discharge Instructions (Signed)
Please read and follow all provided instructions.  Your diagnoses today include:  1. Facial laceration, initial encounter   2. Laceration of forehead, initial encounter   3. Abrasion of right knee, initial encounter   4. Minor head injury without loss of consciousness, initial encounter     Tests performed today include: X-ray of the affected area that did not show any foreign bodies or broken bones CT of your head that did not show any internal bruising or bleeding.  Vital signs. See below for your results today.   Medications prescribed:  None  Take any prescribed medications only as directed.   Home care instructions:  Follow any educational materials and wound care instructions contained in this packet.   Keep affected area above the level of your heart when possible to minimize swelling. Wash area gently twice a day with warm soapy water. Do not apply alcohol or hydrogen peroxide. Cover the area if it draining or weeping.   Follow-up instructions: Suture Removal: Return to the Emergency Department or see your primary care care doctor in 5 days for a recheck of your wound and removal of your sutures or staples.    Return instructions:  Return to the Emergency Department if you have: Fever Worsening pain Worsening swelling of the wound Pus draining from the wound Redness of the skin that moves away from the wound, especially if it streaks away from the affected area  Development of severe headache, vomiting, or confusion Any other emergent concerns  Your vital signs today were: BP (!) 170/77 (BP Location: Right Arm)   Pulse 68   Temp 98.3 F (36.8 C) (Oral)   Resp 18   SpO2 100%  If your blood pressure (BP) was elevated above 135/85 this visit, please have this repeated by your doctor within one month. --------------

## 2019-07-30 NOTE — ED Triage Notes (Signed)
Pt here after a fall walking outside. No LOC, some dizziness afterward. Wounds on bridge of nose, over left eye and right knee. Bleeding controlled, no blood thinner.

## 2019-07-30 NOTE — ED Provider Notes (Signed)
[facility] EMERGENCY DEPARTMENT Provider Note   CSN: [id number] Arrival date & time: 07/30/19  [id number]     History Chief Complaint  Patient presents with  . Fall  . Facial Lacerations    Philip [name] is a 74 y.o. male.  Patient presents the emergency department after a fall this morning.  Patient was walking and tripped on a sidewalk, landing face first onto a cement driveway.  Patient was wearing sunglasses.  He sustained lacerations to his forehead and to the bridge of his nose.  He denies loss of consciousness.  He was able to get up and walk home.  There, his wife assisted him.  He had some dizziness at home but denies any significant headache, vomiting, or confusion.  He has developed some swelling of the forehead above the bridge of the nose.  He also sustained abrasion to his right knee.  Tetanus is up-to-date.  No use of anticoagulation.        Past Medical History:  Diagnosis Date  . Cancer ([place])    PROSTATE  . Edema leg Sept 28, 2016   left leg from foot to thigh, increasinly worse over last 4 weeks  . Hypoglycemia   . Swelling LAST 30 DAYS DR Bennett   LEFT LEG AND FOOT    Patient Active Problem List   Diagnosis Date Noted  . Essential hypertension 03/30/2018  . Routine general medical examination at a health care facility 03/30/2018  . Port-A-Cath in place 08/25/2017  . Hyperlipidemia LDL goal <100 03/16/2017  . Atherosclerosis of aorta ([place]) 03/11/2017  . Deficiency anemia 03/11/2017  . Chronic idiopathic constipation 03/11/2017  . Seasonal allergic rhinitis due to pollen 03/11/2017  . Gastroesophageal reflux disease without esophagitis 03/11/2017  . Malignant neoplasm of bone ([place]) 10/16/2015  . Prostate cancer ([place]) 02/02/2015    Past Surgical History:  Procedure Laterality Date  . GUM SURGERY     TEETH IMPLANTS ALSO  . ORCHIECTOMY Bilateral 01/03/2015   Procedure: ORCHIECTOMY;  Surgeon: Alexis Frock, MD;  Location: [facility] ORS;  Service:  Urology;  Laterality: Bilateral;       Family History  Problem Relation Age of Onset  . CVA Mother   . CAD Father   . Osteoarthritis Brother     Social History   Tobacco Use  . Smoking status: Never Smoker  . Smokeless tobacco: Never Used  Substance Use Topics  . Alcohol use: Yes    Alcohol/week: 1.0 standard drinks    Types: 1 Glasses of wine per week    Comment: occasional wine with dinner  . Drug use: No    Home Medications Prior to Admission medications   Medication Sig Start Date End Date Taking? Authorizing Provider  aspirin EC 81 MG tablet Take 81 mg by mouth daily.    [provider]  atorvastatin (LIPITOR) 10 MG tablet TAKE 1 TABLET BY MOUTH EVERY DAY 06/29/19   [name], MD  azelastine (ASTELIN) 0.1 % nasal spray PLACE 1 SPRAY INTO BOTH NOSTRILS 2 (TWO) TIMES DAILY. USE IN EACH NOSTRIL AS DIRECTED 09/26/18   [name], MD  BIOTIN 5000 PO Take 1 tablet by mouth daily. 02/02/15   [provider]  Calcium Carbonate-Vitamin D3 (CALCIUM 600+D3) 600-400 MG-UNIT TABS Take 1 tablet by mouth daily. 02/02/15   [provider]  Denosumab (XGEVA [place]) Inject into the skin.    [provider]  fexofenadine (ALLEGRA) 180 MG tablet Take 180 mg by mouth daily.  [provider]  fluticasone (FLONASE) 50 MCG/ACT nasal spray SPRAY 2 SPRAYS INTO EACH NOSTRIL EVERY DAY 11/23/18   [name], MD  hydrOXYzine (ATARAX/VISTARIL) 10 MG tablet 1 TABLET BY MOUTH EVERY 8 HOURS AS NEEDED 03/31/18   [provider]  lidocaine-prilocaine (EMLA) cream Apply 1 application topically as needed. 12/27/18   [name], MD  LORazepam (ATIVAN) 1 MG tablet TAKE 1 TABLET (1 MG TOTAL) BY MOUTH EVERY 8 (EIGHT) HOURS AS NEEDED FOR ANXIETY. 06/09/19   [name], MD  Multiple Vitamin (MULTIVITAMIN) tablet Take 1 tablet by mouth daily.    [provider]  olmesartan (BENICAR) 20 MG tablet Take 1 tablet (20 mg total) by mouth daily.  06/02/19   [name], MD  ZYTIGA 250 MG tablet TAKE 4 TABLETS BY MOUTH ONCE DAILY AS DIRECTED.  TAKE 1 HOUR BEFORE OR 2 HOURS AFTER A MEAL 05/19/19   [name], MD  atorvastatin (LIPITOR) 10 MG tablet Take 1 tablet (10 mg total) by mouth daily. 10/07/18   [name], MD    Allergies    Prednisone  Review of Systems   Review of Systems  Constitutional: Negative for fatigue.  HENT: Positive for facial swelling. Negative for dental problem, nosebleeds and tinnitus.   Eyes: Negative for photophobia, pain and visual disturbance.  Respiratory: Negative for shortness of breath.   Cardiovascular: Negative for chest pain.  Gastrointestinal: Negative for nausea and vomiting.  Musculoskeletal: Negative for back pain, gait problem and neck pain.  Skin: Positive for wound.  Neurological: Positive for dizziness. Negative for weakness, light-headedness, numbness and headaches.  Psychiatric/Behavioral: Negative for confusion and decreased concentration.    Physical Exam Updated Vital Signs BP (!) 170/77 (BP Location: Right Arm)   Pulse 68   Temp 98.3 F (36.8 C) (Oral)   Resp 18   SpO2 100%   Physical Exam Vitals and nursing note reviewed.  Constitutional:      Appearance: He is well-developed.  HENT:     Head: Normocephalic. No raccoon eyes or Battle's sign.     Jaw: There is normal jaw occlusion.      Right Ear: Tympanic membrane, ear canal and external ear normal. No hemotympanum.     Left Ear: Tympanic membrane, ear canal and external ear normal. No hemotympanum.     Nose: Nose normal.     Mouth/Throat:     Dentition: Normal dentition.  Eyes:     General: Lids are normal.     Conjunctiva/sclera: Conjunctivae normal.     Pupils: Pupils are equal, round, and reactive to light.     Comments: No visible hyphema  Cardiovascular:     Rate and Rhythm: Normal rate and regular rhythm.  Pulmonary:     Effort: Pulmonary effort is normal.     Breath sounds: Normal breath  sounds.  Abdominal:     Palpations: Abdomen is soft.     Tenderness: There is no abdominal tenderness.  Musculoskeletal:        General: Normal range of motion.     Cervical back: Normal range of motion and neck supple. No tenderness or bony tenderness.     Thoracic back: No tenderness or bony tenderness.     Lumbar back: No tenderness or bony tenderness.     Right knee: No swelling or effusion. Normal range of motion.       Legs:  Skin:    General: Skin is warm and dry.  Neurological:  Mental Status: He is alert and oriented to person, place, and time.     GCS: GCS eye subscore is 4. GCS verbal subscore is 5. GCS motor subscore is 6.     Cranial Nerves: No cranial nerve deficit.     Sensory: No sensory deficit.     Coordination: Coordination normal.     Deep Tendon Reflexes: Reflexes are normal and symmetric.     ED Results / Procedures / Treatments   Labs (all labs ordered are listed, but only abnormal results are displayed) Labs Reviewed - No data to display  EKG None  Radiology CT Head Wo Contrast  Result Date: 07/30/2019 CLINICAL DATA:  Recent fall, initial encounter EXAM: CT HEAD WITHOUT CONTRAST TECHNIQUE: Contiguous axial images were obtained from the base of the skull through the vertex without intravenous contrast. COMPARISON:  None. FINDINGS: Brain: Mild chronic white matter ischemic change and atrophic change is noted. No acute hemorrhage, acute infarction or space-occupying mass lesion is seen. Vascular: No hyperdense vessel or unexpected calcification. Skull: Normal. Negative for fracture or focal lesion. Sinuses/Orbits: No acute finding. Other: None. IMPRESSION: Chronic white-matter ischemic change and atrophic change without acute abnormality. Electronically Signed   By: Inez Catalina M.D.   On: 07/30/2019 09:36   DG Knee Complete 4 Views Right  Result Date: 07/30/2019 CLINICAL DATA:  History of metastatic prostate carcinoma with recent fall and pain, initial  encounter EXAM: RIGHT KNEE - COMPLETE 4+ VIEW COMPARISON:  None. FINDINGS: Mild medial joint space narrowing is noted. Multiple calcified loose bodies are noted within the posterior knee joint. Mild patellofemoral degenerative changes are seen. No acute fracture or dislocation is noted. IMPRESSION: Chronic changes without acute abnormality. Electronically Signed   By: Inez Catalina M.D.   On: 07/30/2019 09:45    Procedures .Marland KitchenLaceration Repair  Date/Time: 07/30/2019 10:32 AM Performed by: Carlisle Cater, PA-C Authorized by: Carlisle Cater, PA-C   Consent:    Consent obtained:  Verbal   Consent given by:  Patient   Risks discussed:  Infection, pain, poor cosmetic result and poor wound healing   Alternatives discussed:  No treatment Anesthesia (see MAR for exact dosages):    Anesthesia method:  Local infiltration   Local anesthetic:  Lidocaine 1% w/o epi Laceration details:    Location:  Face   Face location:  Nose   Length (cm):  3 Repair type:    Repair type:  Simple Pre-procedure details:    Preparation:  Patient was prepped and draped in usual sterile fashion and imaging obtained to evaluate for foreign bodies Exploration:    Hemostasis achieved with:  Direct pressure   Wound exploration: wound explored through full range of motion and entire depth of wound probed and visualized     Contaminated: no   Treatment:    Area cleansed with:  Saline and Shur-Clens   Amount of cleaning:  Standard Skin repair:    Repair method:  Sutures   Suture size:  6-0   Suture material:  Nylon   Suture technique:  Simple interrupted   Number of sutures:  7 Approximation:    Approximation:  Close Post-procedure details:    Dressing:  Open (no dressing)   Patient tolerance of procedure:  Tolerated well, no immediate complications .Marland KitchenLaceration Repair  Date/Time: 07/30/2019 10:33 AM Performed by: Carlisle Cater, PA-C Authorized by: Carlisle Cater, PA-C   Consent:    Consent obtained:   Verbal   Consent given by:  Patient   Risks discussed:  Infection, pain, poor wound healing and poor cosmetic result   Alternatives discussed:  No treatment Anesthesia (see MAR for exact dosages):    Anesthesia method:  Local infiltration   Local anesthetic:  Lidocaine 1% w/o epi Laceration details:    Location:  Face   Face location:  Forehead   Length (cm):  2 Repair type:    Repair type:  Simple Pre-procedure details:    Preparation:  Patient was prepped and draped in usual sterile fashion and imaging obtained to evaluate for foreign bodies Exploration:    Hemostasis achieved with:  Direct pressure   Wound exploration: wound explored through full range of motion and entire depth of wound probed and visualized     Wound extent: no foreign bodies/material noted     Contaminated: no   Treatment:    Area cleansed with:  Saline and Shur-Clens   Amount of cleaning:  Standard Skin repair:    Repair method:  Sutures   Suture size:  6-0   Suture material:  Nylon   Suture technique:  Simple interrupted   Number of sutures:  3 Approximation:    Approximation:  Close Post-procedure details:    Dressing:  Open (no dressing)   Patient tolerance of procedure:  Tolerated well, no immediate complications   (including critical care time)  Medications Ordered in ED Medications  lidocaine (PF) (XYLOCAINE) 1 % injection 10 mL (10 mLs Infiltration Given by Other 07/30/19 0913)  bacitracin ointment ( Topical Given 07/30/19 0935)    ED Course  I have reviewed the triage vital signs and the nursing notes.  Pertinent labs & imaging results that were available during my care of the patient were reviewed by me and considered in my medical decision making (see chart for details).  Patient seen and examined.  Patient states that his tetanus is likely up-to-date.  Agrees with imaging of the head, given swelling, episode of dizziness and injury.  Also will obtain x-ray of the knee given overlying  abrasion.  Discussed wound repair with patient and wife at bedside.  Vital signs reviewed and are as follows: BP (!) 170/77 (BP Location: Right Arm)   Pulse 68   Temp 98.3 F (36.8 C) (Oral)   Resp 18   SpO2 100%   Pt discussed with and seen by Dr. Johnney Killian.   Wounds repaired without complication.  Patient counseled on wound care. Patient counseled on need to return or see PCP/urgent care for suture removal in 5 days. Patient was urged to return to the Emergency Department urgently with worsening pain, swelling, expanding erythema especially if it streaks away from the affected area, fever, or if they have any other concerns. Patient verbalized understanding.   Patient was counseled on head injury precautions and symptoms that should indicate their return to the ED.  These include severe worsening headache, vision changes, confusion, loss of consciousness, trouble walking, nausea & vomiting, or weakness/tingling in extremities.      MDM Rules/Calculators/A&P                      Pt presents after trip and fall injury, sustaining abrasion and laceration to face.  Lacerations were repaired.  CT head and x-ray were negative for acute injury.  Patient is doing well without any decompensation.  Discussed follow-up plan as above.  Final Clinical Impression(s) / ED Diagnoses Final diagnoses:  Facial laceration, initial encounter  Laceration of forehead, initial encounter  Abrasion of right knee, initial encounter  Minor head injury without loss of consciousness, initial encounter    Rx / DC Orders ED Discharge Orders    None       Carlisle Cater, PA-C 07/30/19 [place], MD 08/10/19 1407

## 2019-07-30 NOTE — ED Provider Notes (Signed)
Medical screening examination/treatment/procedure(s) were conducted as a shared visit with non-physician practitioner(s) and myself.  I personally evaluated the patient during the encounter.    Patient had mechanical fall this morning tripping on the sidewalk. He did land on his face and the cement driveway. Also landed on the right knee. No loss of consciousness. Patient does have significant abrasion to forehead and laceration. Reports slight facial pressure but no severe headache.  Patient is alert and appropriate. Mental status clear. Laceration to face is repaired and in good alignment and position. Ocular motions intact. Patient with deep abrasion to the right knee. Movements coordinated purposeful symmetric.  Agree with plan and management.   Charlesetta Shanks, MD 07/30/19 1036

## 2019-08-05 ENCOUNTER — Telehealth: Payer: Self-pay | Admitting: Oncology

## 2019-08-05 NOTE — Telephone Encounter (Signed)
Scheduled per 05/21 los, patient has been called and voicemail was left.

## 2019-08-09 DIAGNOSIS — S80211A Abrasion, right knee, initial encounter: Secondary | ICD-10-CM | POA: Diagnosis not present

## 2019-08-25 ENCOUNTER — Other Ambulatory Visit: Payer: Self-pay | Admitting: Internal Medicine

## 2019-08-25 DIAGNOSIS — J301 Allergic rhinitis due to pollen: Secondary | ICD-10-CM

## 2019-09-07 DIAGNOSIS — H9313 Tinnitus, bilateral: Secondary | ICD-10-CM | POA: Insufficient documentation

## 2019-09-07 DIAGNOSIS — K219 Gastro-esophageal reflux disease without esophagitis: Secondary | ICD-10-CM | POA: Insufficient documentation

## 2019-09-20 ENCOUNTER — Other Ambulatory Visit: Payer: Self-pay

## 2019-09-20 ENCOUNTER — Inpatient Hospital Stay: Payer: Medicare Other

## 2019-09-20 ENCOUNTER — Inpatient Hospital Stay: Payer: Medicare Other | Attending: Oncology

## 2019-09-20 DIAGNOSIS — R634 Abnormal weight loss: Secondary | ICD-10-CM | POA: Diagnosis not present

## 2019-09-20 DIAGNOSIS — Z7982 Long term (current) use of aspirin: Secondary | ICD-10-CM | POA: Insufficient documentation

## 2019-09-20 DIAGNOSIS — Z9079 Acquired absence of other genital organ(s): Secondary | ICD-10-CM | POA: Diagnosis not present

## 2019-09-20 DIAGNOSIS — I10 Essential (primary) hypertension: Secondary | ICD-10-CM | POA: Diagnosis not present

## 2019-09-20 DIAGNOSIS — Z79899 Other long term (current) drug therapy: Secondary | ICD-10-CM | POA: Insufficient documentation

## 2019-09-20 DIAGNOSIS — C7951 Secondary malignant neoplasm of bone: Secondary | ICD-10-CM | POA: Diagnosis not present

## 2019-09-20 DIAGNOSIS — Z95828 Presence of other vascular implants and grafts: Secondary | ICD-10-CM

## 2019-09-20 DIAGNOSIS — F329 Major depressive disorder, single episode, unspecified: Secondary | ICD-10-CM | POA: Insufficient documentation

## 2019-09-20 DIAGNOSIS — Z452 Encounter for adjustment and management of vascular access device: Secondary | ICD-10-CM | POA: Insufficient documentation

## 2019-09-20 DIAGNOSIS — C61 Malignant neoplasm of prostate: Secondary | ICD-10-CM | POA: Diagnosis not present

## 2019-09-20 LAB — CMP (CANCER CENTER ONLY)
ALT: 14 U/L (ref 0–44)
AST: 20 U/L (ref 15–41)
Albumin: 3.8 g/dL (ref 3.5–5.0)
Alkaline Phosphatase: 51 U/L (ref 38–126)
Anion gap: 8 (ref 5–15)
BUN: 15 mg/dL (ref 8–23)
CO2: 25 mmol/L (ref 22–32)
Calcium: 9.4 mg/dL (ref 8.9–10.3)
Chloride: 108 mmol/L (ref 98–111)
Creatinine: 0.91 mg/dL (ref 0.61–1.24)
GFR, Est AFR Am: >60 mL/min (ref >60)
GFR, Estimated: >60 mL/min (ref >60)
Glucose, Bld: 87 mg/dL (ref 70–99)
Potassium: 3.5 mmol/L (ref 3.5–5.1)
Sodium: 141 mmol/L (ref 135–145)
Total Bilirubin: 0.9 mg/dL (ref 0.3–1.2)
Total Protein: 6.6 g/dL (ref 6.5–8.1)

## 2019-09-20 LAB — CBC WITH DIFFERENTIAL (CANCER CENTER ONLY)
Abs Immature Granulocytes: 0.02 10*3/uL (ref 0.00–0.07)
Basophils Absolute: 0 10*3/uL (ref 0.0–0.1)
Basophils Relative: 0 %
Eosinophils Absolute: 0.1 10*3/uL (ref 0.0–0.5)
Eosinophils Relative: 2 %
HCT: 38 % — ABNORMAL LOW (ref 39.0–52.0)
Hemoglobin: 12.4 g/dL — ABNORMAL LOW (ref 13.0–17.0)
Immature Granulocytes: 0 %
Lymphocytes Relative: 25 %
Lymphs Abs: 1.3 10*3/uL (ref 0.7–4.0)
MCH: 30.8 pg (ref 26.0–34.0)
MCHC: 32.6 g/dL (ref 30.0–36.0)
MCV: 94.5 fL (ref 80.0–100.0)
Monocytes Absolute: 0.5 10*3/uL (ref 0.1–1.0)
Monocytes Relative: 9 %
Neutro Abs: 3.3 10*3/uL (ref 1.7–7.7)
Neutrophils Relative %: 64 %
Platelet Count: 194 10*3/uL (ref 150–400)
RBC: 4.02 MIL/uL — ABNORMAL LOW (ref 4.22–5.81)
RDW: 13.5 % (ref 11.5–15.5)
WBC Count: 5.2 10*3/uL (ref 4.0–10.5)
nRBC: 0 % (ref 0.0–0.2)

## 2019-09-20 MED ORDER — HEPARIN SOD (PORK) LOCK FLUSH 100 UNIT/ML IV SOLN
500.0000 [IU] | Freq: Once | INTRAVENOUS | Status: AC
  Administered 2019-09-20: 500 [IU]
  Filled 2019-09-20: qty 5

## 2019-09-20 MED ORDER — SODIUM CHLORIDE 0.9% FLUSH
10.0000 mL | Freq: Once | INTRAVENOUS | Status: AC
  Administered 2019-09-20: 10 mL
  Filled 2019-09-20: qty 10

## 2019-09-20 NOTE — Patient Instructions (Signed)

## 2019-09-21 LAB — PROSTATE-SPECIFIC AG, SERUM (LABCORP): Prostate Specific Ag, Serum: <0.1 ng/mL (ref 0.0–4.0)

## 2019-09-22 ENCOUNTER — Inpatient Hospital Stay: Payer: Medicare Other

## 2019-09-22 ENCOUNTER — Inpatient Hospital Stay (HOSPITAL_BASED_OUTPATIENT_CLINIC_OR_DEPARTMENT_OTHER): Payer: Medicare Other | Admitting: Oncology

## 2019-09-22 ENCOUNTER — Other Ambulatory Visit: Payer: Self-pay

## 2019-09-22 VITALS — BP 168/93 | HR 77 | Temp 97.3°F | Resp 16 | Wt 183.9 lb

## 2019-09-22 DIAGNOSIS — C7951 Secondary malignant neoplasm of bone: Secondary | ICD-10-CM | POA: Diagnosis not present

## 2019-09-22 DIAGNOSIS — Z95828 Presence of other vascular implants and grafts: Secondary | ICD-10-CM

## 2019-09-22 DIAGNOSIS — Z9079 Acquired absence of other genital organ(s): Secondary | ICD-10-CM | POA: Diagnosis not present

## 2019-09-22 DIAGNOSIS — R634 Abnormal weight loss: Secondary | ICD-10-CM | POA: Diagnosis not present

## 2019-09-22 DIAGNOSIS — C61 Malignant neoplasm of prostate: Secondary | ICD-10-CM

## 2019-09-22 DIAGNOSIS — Z452 Encounter for adjustment and management of vascular access device: Secondary | ICD-10-CM | POA: Diagnosis not present

## 2019-09-22 DIAGNOSIS — I10 Essential (primary) hypertension: Secondary | ICD-10-CM | POA: Diagnosis not present

## 2019-09-22 MED ORDER — DENOSUMAB 120 MG/1.7ML ~~LOC~~ SOLN
SUBCUTANEOUS | Status: AC
  Filled 2019-09-22: qty 1.7

## 2019-09-22 MED ORDER — DENOSUMAB 120 MG/1.7ML ~~LOC~~ SOLN
120.0000 mg | Freq: Once | SUBCUTANEOUS | Status: AC
  Administered 2019-09-22: 120 mg via SUBCUTANEOUS

## 2019-09-22 NOTE — Progress Notes (Signed)
Hematology and Oncology Follow Up Visit  Philip [name] [id number] 1945/06/09 74 y.o. 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  He was found to have a PSA of 2900 at the time of diagnosis.   Prior Therapy: Status post orchiectomy done on November 2016.  Taxotere chemotherapy at 75 mg/m to start on 02/16/2015. He is completed 6 cycles of therapy in March 2017.  Current therapy: Zytiga 1000 mg daily started in July 2017.  Prednisone was discontinued for better tolerance.  Interim History:  Philip [name] is here for repeat follow-up.  Since the last evaluation, he reports no major changes in his health.  He has reported less dizziness and vertigo since the last visit.  He was evaluated by ENT and scheduled to have a vestibular testing.  He does report some occasional mood swings and periodic phases of depression but has been manageable at this time.  His appetite has been slightly down of lost weight but still able to function normally and attends to activities of daily living.  He has been exercising regularly as well.  Medications: Updated on review. Current Outpatient Medications  Medication Sig Dispense Refill  . aspirin EC 81 MG tablet Take 81 mg by mouth daily.    Marland Kitchen atorvastatin (LIPITOR) 10 MG tablet TAKE 1 TABLET BY MOUTH EVERY DAY 90 tablet 1  . BIOTIN 5000 PO Take 1 tablet by mouth daily.    . Calcium Carbonate-Vitamin D3 (CALCIUM 600+D3) 600-400 MG-UNIT TABS Take 1 tablet by mouth daily.    . Denosumab (XGEVA [place]) Inject into the skin.    . fexofenadine (ALLEGRA) 180 MG tablet Take 180 mg by mouth daily.    . fluticasone (FLONASE) 50 MCG/ACT nasal spray SPRAY 2 SPRAYS INTO EACH NOSTRIL EVERY DAY 48 mL 2  . hydrOXYzine (ATARAX/VISTARIL) 10 MG tablet 1 TABLET BY MOUTH EVERY 8 HOURS AS NEEDED    . lidocaine-prilocaine (EMLA) cream Apply 1  application topically as needed. 30 g 0  . LORazepam (ATIVAN) 1 MG tablet TAKE 1 TABLET (1 MG TOTAL) BY MOUTH EVERY 8 (EIGHT) HOURS AS NEEDED FOR ANXIETY. 30 tablet 0  . Multiple Vitamin (MULTIVITAMIN) tablet Take 1 tablet by mouth daily.    Marland Kitchen olmesartan (BENICAR) 20 MG tablet Take 1 tablet (20 mg total) by mouth daily. 90 tablet 1  . ZYTIGA 250 MG tablet TAKE 4 TABLETS BY MOUTH ONCE DAILY AS DIRECTED.  TAKE 1 HOUR BEFORE OR 2 HOURS AFTER A MEAL 120 tablet 10   No current facility-administered medications for this visit.     Allergies:  Allergies  Allergen Reactions  . Prednisone Anxiety      Physical Exam:       Blood pressure (!) 168/93, pulse 77, temperature (!) 97.3 F (36.3 C), temperature source Temporal, resp. rate 16, weight 183 lb 14.4 oz (83.4 kg), SpO2 100 %.       ECOG: 0     General appearance: Alert, awake without any distress. Head: Atraumatic without abnormalities Oropharynx: Without any thrush or ulcers. Eyes: No scleral icterus. Lymph nodes: No lymphadenopathy noted in the cervical, supraclavicular, or axillary nodes Heart:regular rate and rhythm, without any murmurs or gallops.   Lung: Clear to auscultation without any rhonchi, wheezes or dullness to percussion. Abdomin: Soft, nontender without any shifting dullness or ascites. Musculoskeletal: No clubbing or cyanosis. Neurological: No motor  or sensory deficits. Skin: No rashes or lesions.               Lab Results: Lab Results  Component Value Date   WBC 5.2 09/20/2019   HGB 12.4 (L) 09/20/2019   HCT 38.0 (L) 09/20/2019   MCV 94.5 09/20/2019   PLT 194 09/20/2019     Chemistry      Component Value Date/Time   NA 141 09/20/2019 1028   NA 139 02/10/2017 1323   K 3.5 09/20/2019 1028   K 3.4 (L) 02/10/2017 1323   CL 108 09/20/2019 1028   CO2 25 09/20/2019 1028   CO2 23 02/10/2017 1323   BUN 15 09/20/2019 1028   BUN 13.6 02/10/2017 1323   CREATININE 0.91 09/20/2019 1028    CREATININE 0.9 02/10/2017 1323      Component Value Date/Time   CALCIUM 9.4 09/20/2019 1028   CALCIUM 9.2 02/10/2017 1323   ALKPHOS 51 09/20/2019 1028   ALKPHOS 50 02/10/2017 1323   AST 20 09/20/2019 1028   AST 20 02/10/2017 1323   ALT 14 09/20/2019 1028   ALT 15 02/10/2017 1323   BILITOT 0.9 09/20/2019 1028   BILITOT 0.52 02/10/2017 1323        Results for Philip [name], Philip [name] (MRN [id number]) as of 09/22/2019 09:37  Ref. Range 07/20/2019 10:04 09/20/2019 10:28  Prostate Specific Ag, Serum Latest Ref Range: 0.0 - 4.0 ng/mL <0.1 <0.1             Impression and Plan:  74 year old man with:  1.  Castration-resistant prostate cancer with disease to the bone and lymphadenopathy diagnosed in 2016.      He has tolerated Zytiga without prednisone well without any recent complaints.  His PSA remains undetectable with imaging studies obtained in May 2021 showed excellent radiographic response to therapy.  The natural course of this disease was reviewed and treatment options were reiterated.  Long-term complications with Zytiga were discussed today including hypertension, edema and adrenal insufficiency among others.  He is agreeable to continue given his excellent response.  He is agreeable to continue at this time.   2. IV access: Port-A-Cath remains in place and will be flushed for each visit.  3. Hypertension: Blood pressure is mildly elevated at this time we will continue to monitor.  He is to follow-up with his primary care physician regarding this issue.  4.  Androgen depravation: He status post orchiectomy without any additional androgen deprivation is needed.  5.  Bone directed therapy: Delton See resumed without complications.  Long-term issues including osteonecrosis of the jaw and hypocalcemia were reiterated.  Is agreeable to continue current treatment.  6.  Prognosis: Therapy remains palliative although aggressive measures are warranted given his excellent performance status  and tolerance to treatment.  7.  Tinnitus with occasional vertigo: He has vestibular testing in the near future.  Symptoms seems to be improving.  8.  Depression and mood changes: We have discussed different contributing factors given his cancer diagnosis, lack of testosterone as well as undiagnosed mood disorder possible personality issues.  We have discussed about the role of counseling at this point and he will consider it in the future.  9. Follow-up: In 8 weeks for repeat evaluation and repeat Xgeva.  30 minutes were spent on this visit.  The time was dedicated to updating his disease status, discussing treatment options and outlining future plan of care review.  Zola Button, MD 7/22/20219:47 AM

## 2019-09-22 NOTE — Patient Instructions (Signed)
Denosumab injection °What is this medicine? °DENOSUMAB (den oh sue mab) slows bone breakdown. Prolia is used to treat osteoporosis in women after menopause and in men, and in people who are taking corticosteroids for 6 months or more. Xgeva is used to treat a high calcium level due to cancer and to prevent bone fractures and other bone problems caused by multiple myeloma or cancer bone metastases. Xgeva is also used to treat giant cell tumor of the bone. °This medicine may be used for other purposes; ask your health care provider or pharmacist if you have questions. °COMMON BRAND NAME(S): Prolia, XGEVA °What should I tell my health care provider before I take this medicine? °They need to know if you have any of these conditions: °· dental disease °· having surgery or tooth extraction °· infection °· kidney disease °· low levels of calcium or Vitamin D in the blood °· malnutrition °· on hemodialysis °· skin conditions or sensitivity °· thyroid or parathyroid disease °· an unusual reaction to denosumab, other medicines, foods, dyes, or preservatives °· pregnant or trying to get pregnant °· breast-feeding °How should I use this medicine? °This medicine is for injection under the skin. It is given by a health care professional in a hospital or clinic setting. °A special MedGuide will be given to you before each treatment. Be sure to read this information carefully each time. °For Prolia, talk to your pediatrician regarding the use of this medicine in children. Special care may be needed. For Xgeva, talk to your pediatrician regarding the use of this medicine in children. While this drug may be prescribed for children as young as 13 years for selected conditions, precautions do apply. °Overdosage: If you think you have taken too much of this medicine contact a poison control center or emergency room at once. °NOTE: This medicine is only for you. Do not share this medicine with others. °What if I miss a dose? °It is  important not to miss your dose. Call your doctor or health care professional if you are unable to keep an appointment. °What may interact with this medicine? °Do not take this medicine with any of the following medications: °· other medicines containing denosumab °This medicine may also interact with the following medications: °· medicines that lower your chance of fighting infection °· steroid medicines like prednisone or cortisone °This list may not describe all possible interactions. Give your health care provider a list of all the medicines, herbs, non-prescription drugs, or dietary supplements you use. Also tell them if you smoke, drink alcohol, or use illegal drugs. Some items may interact with your medicine. °What should I watch for while using this medicine? °Visit your doctor or health care professional for regular checks on your progress. Your doctor or health care professional may order blood tests and other tests to see how you are doing. °Call your doctor or health care professional for advice if you get a fever, chills or sore throat, or other symptoms of a cold or flu. Do not treat yourself. This drug may decrease your body's ability to fight infection. Try to avoid being around people who are sick. °You should make sure you get enough calcium and vitamin D while you are taking this medicine, unless your doctor tells you not to. Discuss the foods you eat and the vitamins you take with your health care professional. °See your dentist regularly. Brush and floss your teeth as directed. Before you have any dental work done, tell your dentist you are   receiving this medicine. Do not become pregnant while taking this medicine or for 5 months after stopping it. Talk with your doctor or health care professional about your birth control options while taking this medicine. Women should inform their doctor if they wish to become pregnant or think they might be pregnant. There is a potential for serious side  effects to an unborn child. Talk to your health care professional or pharmacist for more information. What side effects may I notice from receiving this medicine? Side effects that you should report to your doctor or health care professional as soon as possible:  allergic reactions like skin rash, itching or hives, swelling of the face, lips, or tongue  bone pain  breathing problems  dizziness  jaw pain, especially after dental work  redness, blistering, peeling of the skin  signs and symptoms of infection like fever or chills; cough; sore throat; pain or trouble passing urine  signs of low calcium like fast heartbeat, muscle cramps or muscle pain; pain, tingling, numbness in the hands or feet; seizures  unusual bleeding or bruising  unusually weak or tired Side effects that usually do not require medical attention (report to your doctor or health care professional if they continue or are bothersome):  constipation  diarrhea  headache  joint pain  loss of appetite  muscle pain  runny nose  tiredness  upset stomach This list may not describe all possible side effects. Call your doctor for medical advice about side effects. You may report side effects to FDA at 1-800-FDA-1088. Where should I keep my medicine? This medicine is only given in a clinic, doctor's office, or other health care setting and will not be stored at home. NOTE: This sheet is a summary. It may not cover all possible information. If you have questions about this medicine, talk to your doctor, pharmacist, or health care provider.  2020 Elsevier/Gold Standard (2017-06-26 16:10:44)

## 2019-09-23 DIAGNOSIS — R42 Dizziness and giddiness: Secondary | ICD-10-CM | POA: Diagnosis not present

## 2019-09-23 DIAGNOSIS — H903 Sensorineural hearing loss, bilateral: Secondary | ICD-10-CM | POA: Diagnosis not present

## 2019-10-06 ENCOUNTER — Telehealth: Payer: Self-pay

## 2019-10-06 DIAGNOSIS — H8113 Benign paroxysmal vertigo, bilateral: Secondary | ICD-10-CM | POA: Diagnosis not present

## 2019-10-06 NOTE — Telephone Encounter (Signed)
I faxed a re-enrollment application to [employer] for assistance for Zytiga.  [employer] stated they needed [name] household size, income, and 1040 documents.  He will stop by on August 16@9 :30 to bring me this information that I can fax to [employer].  [place] [place] [facility] [place] Phone [phone] Fax [phone] 10/06/2019 2:35 PM

## 2019-10-13 ENCOUNTER — Other Ambulatory Visit: Payer: Self-pay | Admitting: Oncology

## 2019-10-16 ENCOUNTER — Encounter: Payer: Self-pay | Admitting: Oncology

## 2019-10-17 NOTE — Telephone Encounter (Signed)
Patient is approved for Zytiga at no cost through [employer] 10/17/19-10/16/20  [facility] [place] Phone [phone] Fax [phone] 10/17/2019 3:27 PM

## 2019-11-09 ENCOUNTER — Ambulatory Visit: Payer: Medicare Other | Admitting: Internal Medicine

## 2019-11-14 ENCOUNTER — Other Ambulatory Visit: Payer: Self-pay

## 2019-11-14 ENCOUNTER — Encounter: Payer: Self-pay | Admitting: Internal Medicine

## 2019-11-14 ENCOUNTER — Ambulatory Visit (INDEPENDENT_AMBULATORY_CARE_PROVIDER_SITE_OTHER): Payer: Medicare Other | Admitting: Internal Medicine

## 2019-11-14 VITALS — BP 178/102 | HR 70 | Temp 98.1°F | Resp 16 | Ht 76.0 in | Wt 186.0 lb

## 2019-11-14 DIAGNOSIS — Z23 Encounter for immunization: Secondary | ICD-10-CM | POA: Diagnosis not present

## 2019-11-14 DIAGNOSIS — E785 Hyperlipidemia, unspecified: Secondary | ICD-10-CM | POA: Diagnosis not present

## 2019-11-14 DIAGNOSIS — I10 Essential (primary) hypertension: Secondary | ICD-10-CM

## 2019-11-14 DIAGNOSIS — I700 Atherosclerosis of aorta: Secondary | ICD-10-CM | POA: Diagnosis not present

## 2019-11-14 MED ORDER — ATORVASTATIN CALCIUM 10 MG PO TABS: 10.0000 mg | ORAL_TABLET | Freq: Every day | ORAL | 1 refills | Status: DC

## 2019-11-14 MED ORDER — INDAPAMIDE 1.25 MG PO TABS: 1.2500 mg | ORAL_TABLET | Freq: Every day | ORAL | 0 refills | Status: DC

## 2019-11-14 MED ORDER — OLMESARTAN MEDOXOMIL 20 MG PO TABS: 20.0000 mg | ORAL_TABLET | Freq: Every day | ORAL | 1 refills | Status: DC

## 2019-11-14 NOTE — Progress Notes (Signed)
Subjective:  Patient ID: Philip [name], male    DOB: August 14, 1945  Age: 74 y.o. MRN: [id number]  CC: Hypertension and Hyperlipidemia  This visit occurred during the SARS-CoV-2 public health emergency.  Safety protocols were in place, including screening questions prior to the visit, additional usage of staff PPE, and extensive cleaning of exam room while observing appropriate contact time as indicated for disinfecting solutions.    HPI Philip [name] presents for f/up - He is concerned that his blood pressure is not adequately well controlled. He is active and denies any recent episodes of chest pain, shortness of breath, palpitation, edema, fatigue, or headache.   Outpatient Medications Prior to Visit  Medication Sig Dispense Refill  . aspirin EC 81 MG tablet Take 81 mg by mouth daily.    Marland Kitchen BIOTIN 5000 PO Take 1 tablet by mouth daily.    . Denosumab (XGEVA [place]) Inject into the skin.    . fexofenadine (ALLEGRA) 180 MG tablet Take 180 mg by mouth daily.    . hydrOXYzine (ATARAX/VISTARIL) 10 MG tablet 1 TABLET BY MOUTH EVERY 8 HOURS AS NEEDED    . lidocaine-prilocaine (EMLA) cream Apply 1 application topically as needed. 30 g 0  . LORazepam (ATIVAN) 1 MG tablet TAKE 1 TABLET (1 MG TOTAL) BY MOUTH EVERY 8 (EIGHT) HOURS AS NEEDED FOR ANXIETY. 30 tablet 0  . Multiple Vitamin (MULTIVITAMIN) tablet Take 1 tablet by mouth daily.    Marland Kitchen ZYTIGA 250 MG tablet TAKE 4 TABLETS BY MOUTH ONCE DAILY AS DIRECTED.  TAKE 1 HOUR BEFORE OR 2 HOURS AFTER A MEAL 120 tablet 10  . atorvastatin (LIPITOR) 10 MG tablet TAKE 1 TABLET BY MOUTH EVERY DAY 90 tablet 1  . Calcium Carbonate-Vitamin D3 (CALCIUM 600+D3) 600-400 MG-UNIT TABS Take 1 tablet by mouth daily.    Marland Kitchen olmesartan (BENICAR) 20 MG tablet Take 1 tablet (20 mg total) by mouth daily. 90 tablet 1  . fluticasone (FLONASE) 50 MCG/ACT nasal spray SPRAY 2 SPRAYS INTO EACH NOSTRIL EVERY DAY (Patient not taking: Reported on 11/14/2019) 48 mL 2   No facility-administered  medications prior to visit.    ROS Review of Systems  Constitutional: Negative for appetite change, diaphoresis, fatigue and unexpected weight change.  HENT: Negative.   Eyes: Negative for visual disturbance.  Respiratory: Negative for cough, chest tightness, shortness of breath and wheezing.   Cardiovascular: Negative for chest pain, palpitations and leg swelling.  Gastrointestinal: Negative for abdominal pain, constipation, diarrhea, nausea and vomiting.  Endocrine: Negative.   Genitourinary: Negative.  Negative for difficulty urinating.  Musculoskeletal: Negative for arthralgias.  Skin: Negative.  Negative for color change and pallor.  Neurological: Negative.  Negative for dizziness, weakness and light-headedness.  Hematological: Negative for adenopathy. Does not bruise/bleed easily.  Psychiatric/Behavioral: Negative.     Objective:  BP (!) 178/102   Pulse 70   Temp 98.1 F (36.7 C) (Oral)   Resp 16   Ht 6\' 4"  (1.93 m)   Wt 186 lb (84.4 kg)   SpO2 99%   BMI 22.64 kg/m   BP Readings from Last 3 Encounters:  11/14/19 (!) 178/102  09/22/19 (!) 168/93  07/30/19 (!) 168/70    Wt Readings from Last 3 Encounters:  11/14/19 186 lb (84.4 kg)  09/22/19 183 lb 14.4 oz (83.4 kg)  07/22/19 191 lb 6.4 oz (86.8 kg)    Physical Exam Vitals reviewed.  Constitutional:      Appearance: Normal appearance.  HENT:     Nose: Nose  normal.     Mouth/Throat:     Mouth: Mucous membranes are moist.  Eyes:     General: No scleral icterus.    Conjunctiva/sclera: Conjunctivae normal.  Cardiovascular:     Rate and Rhythm: Normal rate and regular rhythm.     Heart sounds: No murmur heard.   Pulmonary:     Effort: Pulmonary effort is normal.     Breath sounds: No stridor. No wheezing, rhonchi or rales.  Abdominal:     General: Abdomen is flat. Bowel sounds are normal. There is no distension.     Palpations: Abdomen is soft. There is no hepatomegaly, splenomegaly or mass.      Tenderness: There is no abdominal tenderness.  Musculoskeletal:        General: Normal range of motion.     Cervical back: Neck supple.     Right lower leg: No edema.     Left lower leg: No edema.  Lymphadenopathy:     Cervical: No cervical adenopathy.  Skin:    General: Skin is warm and dry.     Coloration: Skin is not pale.  Neurological:     General: No focal deficit present.     Mental Status: He is alert.     Lab Results  Component Value Date   WBC 5.2 09/20/2019   HGB 12.4 (L) 09/20/2019   HCT 38.0 (L) 09/20/2019   PLT 194 09/20/2019   GLUCOSE 87 09/20/2019   CHOL 211 (H) 05/09/2019   TRIG 313.0 (H) 05/09/2019   HDL 69.30 05/09/2019   LDLDIRECT 97.0 05/09/2019   ALT 14 09/20/2019   AST 20 09/20/2019   NA 141 09/20/2019   K 3.5 09/20/2019   CL 108 09/20/2019   CREATININE 0.91 09/20/2019   BUN 15 09/20/2019   CO2 25 09/20/2019   TSH 1.40 05/09/2019   PSA 0.17 03/29/2018   INR 1.06 02/09/2015    CT Head Wo Contrast  Result Date: 07/30/2019 CLINICAL DATA:  Recent fall, initial encounter EXAM: CT HEAD WITHOUT CONTRAST TECHNIQUE: Contiguous axial images were obtained from the base of the skull through the vertex without intravenous contrast. COMPARISON:  None. FINDINGS: Brain: Mild chronic white matter ischemic change and atrophic change is noted. No acute hemorrhage, acute infarction or space-occupying mass lesion is seen. Vascular: No hyperdense vessel or unexpected calcification. Skull: Normal. Negative for fracture or focal lesion. Sinuses/Orbits: No acute finding. Other: None. IMPRESSION: Chronic white-matter ischemic change and atrophic change without acute abnormality. Electronically Signed   By: Inez Catalina M.D.   On: 07/30/2019 09:36   DG Knee Complete 4 Views Right  Result Date: 07/30/2019 CLINICAL DATA:  History of metastatic prostate carcinoma with recent fall and pain, initial encounter EXAM: RIGHT KNEE - COMPLETE 4+ VIEW COMPARISON:  None. FINDINGS: Mild  medial joint space narrowing is noted. Multiple calcified loose bodies are noted within the posterior knee joint. Mild patellofemoral degenerative changes are seen. No acute fracture or dislocation is noted. IMPRESSION: Chronic changes without acute abnormality. Electronically Signed   By: Inez Catalina M.D.   On: 07/30/2019 09:45    Assessment & Plan:   Viviano was seen today for hypertension and hyperlipidemia.  Diagnoses and all orders for this visit:  Atherosclerosis of aorta ([place])- Will continue to address risk factor modifications. -     atorvastatin (LIPITOR) 10 MG tablet; Take 1 tablet (10 mg total) by mouth daily.  Essential hypertension- His blood pressure is not adequately well controlled. I recommended that  he add indapamide to the ARB. -     olmesartan (BENICAR) 20 MG tablet; Take 1 tablet (20 mg total) by mouth daily. -     indapamide (LOZOL) 1.25 MG tablet; Take 1 tablet (1.25 mg total) by mouth daily.  Flu vaccine need -     Flu Vaccine QUAD High Dose(Fluad)  Hyperlipidemia LDL goal <100 -     atorvastatin (LIPITOR) 10 MG tablet; Take 1 tablet (10 mg total) by mouth daily.  Other orders -     Pneumococcal polysaccharide vaccine 23-valent greater than or equal to 2yo subcutaneous/IM   I have discontinued Pilar Plate Odor's Calcium Carbonate-Vitamin D3 and fluticasone. I have also changed his atorvastatin. Additionally, I am having him start on indapamide. Lastly, I am having him maintain his fexofenadine, BIOTIN 5000 PO, Denosumab (XGEVA [place]), aspirin EC, multivitamin, hydrOXYzine, lidocaine-prilocaine, Zytiga, LORazepam, and olmesartan.  Meds ordered this encounter  Medications  . atorvastatin (LIPITOR) 10 MG tablet    Sig: Take 1 tablet (10 mg total) by mouth daily.    Dispense:  90 tablet    Refill:  1  . olmesartan (BENICAR) 20 MG tablet    Sig: Take 1 tablet (20 mg total) by mouth daily.    Dispense:  90 tablet    Refill:  1  . indapamide (LOZOL) 1.25 MG tablet     Sig: Take 1 tablet (1.25 mg total) by mouth daily.    Dispense:  90 tablet    Refill:  0     Follow-up: Return in about 6 weeks (around 12/26/2019).  Scarlette Calico, MD

## 2019-11-14 NOTE — Patient Instructions (Signed)

## 2019-11-29 ENCOUNTER — Inpatient Hospital Stay: Payer: Medicare Other | Attending: Oncology

## 2019-11-29 ENCOUNTER — Other Ambulatory Visit: Payer: Self-pay

## 2019-11-29 ENCOUNTER — Inpatient Hospital Stay: Payer: Medicare Other

## 2019-11-29 DIAGNOSIS — Z95828 Presence of other vascular implants and grafts: Secondary | ICD-10-CM

## 2019-11-29 DIAGNOSIS — C61 Malignant neoplasm of prostate: Secondary | ICD-10-CM | POA: Diagnosis not present

## 2019-11-29 LAB — CBC WITH DIFFERENTIAL (CANCER CENTER ONLY)
Abs Immature Granulocytes: 0.01 10*3/uL (ref 0.00–0.07)
Basophils Absolute: 0 10*3/uL (ref 0.0–0.1)
Basophils Relative: 0 %
Eosinophils Absolute: 0.1 10*3/uL (ref 0.0–0.5)
Eosinophils Relative: 2 %
HCT: 38.2 % — ABNORMAL LOW (ref 39.0–52.0)
Hemoglobin: 12.6 g/dL — ABNORMAL LOW (ref 13.0–17.0)
Immature Granulocytes: 0 %
Lymphocytes Relative: 30 %
Lymphs Abs: 2 10*3/uL (ref 0.7–4.0)
MCH: 30.5 pg (ref 26.0–34.0)
MCHC: 33 g/dL (ref 30.0–36.0)
MCV: 92.5 fL (ref 80.0–100.0)
Monocytes Absolute: 0.6 10*3/uL (ref 0.1–1.0)
Monocytes Relative: 9 %
Neutro Abs: 4.1 10*3/uL (ref 1.7–7.7)
Neutrophils Relative %: 59 %
Platelet Count: 215 10*3/uL (ref 150–400)
RBC: 4.13 MIL/uL — ABNORMAL LOW (ref 4.22–5.81)
RDW: 13.2 % (ref 11.5–15.5)
WBC Count: 6.9 10*3/uL (ref 4.0–10.5)
nRBC: 0 % (ref 0.0–0.2)

## 2019-11-29 LAB — CMP (CANCER CENTER ONLY)
ALT: 16 U/L (ref 0–44)
AST: 22 U/L (ref 15–41)
Albumin: 3.7 g/dL (ref 3.5–5.0)
Alkaline Phosphatase: 50 U/L (ref 38–126)
Anion gap: 7 (ref 5–15)
BUN: 16 mg/dL (ref 8–23)
CO2: 28 mmol/L (ref 22–32)
Calcium: 9.2 mg/dL (ref 8.9–10.3)
Chloride: 104 mmol/L (ref 98–111)
Creatinine: 0.83 mg/dL (ref 0.61–1.24)
GFR, Est AFR Am: >60 mL/min (ref >60)
GFR, Estimated: >60 mL/min (ref >60)
Glucose, Bld: 91 mg/dL (ref 70–99)
Potassium: 3.2 mmol/L — ABNORMAL LOW (ref 3.5–5.1)
Sodium: 139 mmol/L (ref 135–145)
Total Bilirubin: 0.7 mg/dL (ref 0.3–1.2)
Total Protein: 6.6 g/dL (ref 6.5–8.1)

## 2019-11-29 MED ORDER — SODIUM CHLORIDE 0.9% FLUSH
10.0000 mL | Freq: Once | INTRAVENOUS | Status: AC
  Administered 2019-11-29: 10 mL
  Filled 2019-11-29: qty 10

## 2019-11-29 MED ORDER — HEPARIN SOD (PORK) LOCK FLUSH 100 UNIT/ML IV SOLN
500.0000 [IU] | Freq: Once | INTRAVENOUS | Status: AC
  Administered 2019-11-29: 500 [IU]
  Filled 2019-11-29: qty 5

## 2019-11-30 DIAGNOSIS — Z23 Encounter for immunization: Secondary | ICD-10-CM | POA: Diagnosis not present

## 2019-11-30 LAB — PROSTATE-SPECIFIC AG, SERUM (LABCORP): Prostate Specific Ag, Serum: <0.1 ng/mL (ref 0.0–4.0)

## 2019-12-02 ENCOUNTER — Inpatient Hospital Stay: Payer: Medicare Other | Attending: Oncology | Admitting: Oncology

## 2019-12-02 ENCOUNTER — Other Ambulatory Visit: Payer: Self-pay

## 2019-12-02 ENCOUNTER — Inpatient Hospital Stay: Payer: Medicare Other

## 2019-12-02 VITALS — BP 167/90 | HR 75 | Temp 97.0°F | Resp 17 | Ht 76.0 in | Wt 187.8 lb

## 2019-12-02 DIAGNOSIS — Z79899 Other long term (current) drug therapy: Secondary | ICD-10-CM | POA: Diagnosis not present

## 2019-12-02 DIAGNOSIS — M79642 Pain in left hand: Secondary | ICD-10-CM | POA: Insufficient documentation

## 2019-12-02 DIAGNOSIS — F32A Depression, unspecified: Secondary | ICD-10-CM | POA: Diagnosis not present

## 2019-12-02 DIAGNOSIS — M25472 Effusion, left ankle: Secondary | ICD-10-CM | POA: Insufficient documentation

## 2019-12-02 DIAGNOSIS — Z8249 Family history of ischemic heart disease and other diseases of the circulatory system: Secondary | ICD-10-CM | POA: Diagnosis not present

## 2019-12-02 DIAGNOSIS — Z8269 Family history of other diseases of the musculoskeletal system and connective tissue: Secondary | ICD-10-CM | POA: Diagnosis not present

## 2019-12-02 DIAGNOSIS — M79641 Pain in right hand: Secondary | ICD-10-CM | POA: Insufficient documentation

## 2019-12-02 DIAGNOSIS — M879 Osteonecrosis, unspecified: Secondary | ICD-10-CM | POA: Diagnosis not present

## 2019-12-02 DIAGNOSIS — Z9079 Acquired absence of other genital organ(s): Secondary | ICD-10-CM | POA: Diagnosis not present

## 2019-12-02 DIAGNOSIS — I10 Essential (primary) hypertension: Secondary | ICD-10-CM | POA: Diagnosis not present

## 2019-12-02 DIAGNOSIS — C7951 Secondary malignant neoplasm of bone: Secondary | ICD-10-CM | POA: Insufficient documentation

## 2019-12-02 DIAGNOSIS — Z888 Allergy status to other drugs, medicaments and biological substances status: Secondary | ICD-10-CM | POA: Diagnosis not present

## 2019-12-02 DIAGNOSIS — H9319 Tinnitus, unspecified ear: Secondary | ICD-10-CM | POA: Diagnosis not present

## 2019-12-02 DIAGNOSIS — M25561 Pain in right knee: Secondary | ICD-10-CM | POA: Insufficient documentation

## 2019-12-02 DIAGNOSIS — C61 Malignant neoplasm of prostate: Secondary | ICD-10-CM | POA: Diagnosis not present

## 2019-12-02 DIAGNOSIS — Z95828 Presence of other vascular implants and grafts: Secondary | ICD-10-CM

## 2019-12-02 MED ORDER — DENOSUMAB 120 MG/1.7ML ~~LOC~~ SOLN
SUBCUTANEOUS | Status: AC
  Filled 2019-12-02: qty 1.7

## 2019-12-02 MED ORDER — DENOSUMAB 120 MG/1.7ML ~~LOC~~ SOLN
120.0000 mg | Freq: Once | SUBCUTANEOUS | Status: AC
  Administered 2019-12-02: 120 mg via SUBCUTANEOUS

## 2019-12-02 NOTE — Patient Instructions (Signed)
Denosumab injection What is this medicine? DENOSUMAB (den oh sue mab) slows bone breakdown. Prolia is used to treat osteoporosis in women after menopause and in men, and in people who are taking corticosteroids for 6 months or more. Xgeva is used to treat a high calcium level due to cancer and to prevent bone fractures and other bone problems caused by multiple myeloma or cancer bone metastases. Xgeva is also used to treat giant cell tumor of the bone. This medicine may be used for other purposes; ask your health care provider or pharmacist if you have questions. COMMON BRAND NAME(S): Prolia, XGEVA What should I tell my health care provider before I take this medicine? They need to know if you have any of these conditions:  dental disease  having surgery or tooth extraction  infection  kidney disease  low levels of calcium or Vitamin D in the blood  malnutrition  on hemodialysis  skin conditions or sensitivity  thyroid or parathyroid disease  an unusual reaction to denosumab, other medicines, foods, dyes, or preservatives  pregnant or trying to get pregnant  breast-feeding How should I use this medicine? This medicine is for injection under the skin. It is given by a health care professional in a hospital or clinic setting. A special MedGuide will be given to you before each treatment. Be sure to read this information carefully each time. For Prolia, talk to your pediatrician regarding the use of this medicine in children. Special care may be needed. For Xgeva, talk to your pediatrician regarding the use of this medicine in children. While this drug may be prescribed for children as young as 13 years for selected conditions, precautions do apply. Overdosage: If you think you have taken too much of this medicine contact a poison control center or emergency room at once. NOTE: This medicine is only for you. Do not share this medicine with others. What if I miss a dose? It is  important not to miss your dose. Call your doctor or health care professional if you are unable to keep an appointment. What may interact with this medicine? Do not take this medicine with any of the following medications:  other medicines containing denosumab This medicine may also interact with the following medications:  medicines that lower your chance of fighting infection  steroid medicines like prednisone or cortisone This list may not describe all possible interactions. Give your health care provider a list of all the medicines, herbs, non-prescription drugs, or dietary supplements you use. Also tell them if you smoke, drink alcohol, or use illegal drugs. Some items may interact with your medicine. What should I watch for while using this medicine? Visit your doctor or health care professional for regular checks on your progress. Your doctor or health care professional may order blood tests and other tests to see how you are doing. Call your doctor or health care professional for advice if you get a fever, chills or sore throat, or other symptoms of a cold or flu. Do not treat yourself. This drug may decrease your body's ability to fight infection. Try to avoid being around people who are sick. You should make sure you get enough calcium and vitamin D while you are taking this medicine, unless your doctor tells you not to. Discuss the foods you eat and the vitamins you take with your health care professional. See your dentist regularly. Brush and floss your teeth as directed. Before you have any dental work done, tell your dentist you are   receiving this medicine. Do not become pregnant while taking this medicine or for 5 months after stopping it. Talk with your doctor or health care professional about your birth control options while taking this medicine. Women should inform their doctor if they wish to become pregnant or think they might be pregnant. There is a potential for serious side  effects to an unborn child. Talk to your health care professional or pharmacist for more information. What side effects may I notice from receiving this medicine? Side effects that you should report to your doctor or health care professional as soon as possible:  allergic reactions like skin rash, itching or hives, swelling of the face, lips, or tongue  bone pain  breathing problems  dizziness  jaw pain, especially after dental work  redness, blistering, peeling of the skin  signs and symptoms of infection like fever or chills; cough; sore throat; pain or trouble passing urine  signs of low calcium like fast heartbeat, muscle cramps or muscle pain; pain, tingling, numbness in the hands or feet; seizures  unusual bleeding or bruising  unusually weak or tired Side effects that usually do not require medical attention (report to your doctor or health care professional if they continue or are bothersome):  constipation  diarrhea  headache  joint pain  loss of appetite  muscle pain  runny nose  tiredness  upset stomach This list may not describe all possible side effects. Call your doctor for medical advice about side effects. You may report side effects to FDA at 1-800-FDA-1088. Where should I keep my medicine? This medicine is only given in a clinic, doctor's office, or other health care setting and will not be stored at home. NOTE: This sheet is a summary. It may not cover all possible information. If you have questions about this medicine, talk to your doctor, pharmacist, or health care provider.  2020 [employer]vier/Gold Standard (2017-06-26 16:10:44)

## 2019-12-02 NOTE — Progress Notes (Signed)
Hematology and Oncology Follow Up Visit  Philip [name] [id number] Jan 26, 1946 74 y.o. 12/02/2019 9:27 AM [name], MDJones, [name], MD   Principle Diagnosis: 74 year old man with advanced prostate cancer with disease to the bone and lymphadenopathy noted in 2016. He has castration-resistant after presenting with PSA of 2900.  Prior Therapy: Status post orchiectomy done on November 2016.  Taxotere chemotherapy at 75 mg/m to start on 02/16/2015. He is completed 6 cycles of therapy in March 2017.  Current therapy: Zytiga 1000 mg daily started in July 2017.  Prednisone was discontinued for better tolerance.  Interim History:  Mr. Bornemann returns today for repeat evaluation. Since the last visit, he reports feeling well without any major complaints. He denies any bone pain or pathological fractures. He denies any excessive fatigue or tiredness. He has tolerated Zytiga without any new concerns. He denies any nausea or lower extremity edema. He does report occasional anxiety but has been manageable overall. He denies any recent episodes of vertigo.  Medications: Unchanged on review. Current Outpatient Medications  Medication Sig Dispense Refill  . aspirin EC 81 MG tablet Take 81 mg by mouth daily.    Marland Kitchen atorvastatin (LIPITOR) 10 MG tablet Take 1 tablet (10 mg total) by mouth daily. 90 tablet 1  . BIOTIN 5000 PO Take 1 tablet by mouth daily.    . Denosumab (XGEVA [place]) Inject into the skin.    . fexofenadine (ALLEGRA) 180 MG tablet Take 180 mg by mouth daily.    . hydrOXYzine (ATARAX/VISTARIL) 10 MG tablet 1 TABLET BY MOUTH EVERY 8 HOURS AS NEEDED    . indapamide (LOZOL) 1.25 MG tablet Take 1 tablet (1.25 mg total) by mouth daily. 90 tablet 0  . lidocaine-prilocaine (EMLA) cream Apply 1 application topically as needed. 30 g 0  . LORazepam (ATIVAN) 1 MG tablet TAKE 1 TABLET (1 MG TOTAL) BY MOUTH EVERY 8 (EIGHT) HOURS AS NEEDED FOR ANXIETY. 30 tablet 0  . Multiple Vitamin (MULTIVITAMIN) tablet Take  1 tablet by mouth daily.    Marland Kitchen olmesartan (BENICAR) 20 MG tablet Take 1 tablet (20 mg total) by mouth daily. 90 tablet 1  . ZYTIGA 250 MG tablet TAKE 4 TABLETS BY MOUTH ONCE DAILY AS DIRECTED.  TAKE 1 HOUR BEFORE OR 2 HOURS AFTER A MEAL 120 tablet 10   No current facility-administered medications for this visit.     Allergies:  Allergies  Allergen Reactions  . Prednisone Anxiety      Physical Exam:      Blood pressure (!) 167/90, pulse 75, temperature (!) 97 F (36.1 C), temperature source Tympanic, resp. rate 17, height 6\' 4"  (1.93 m), weight 187 lb 12.8 oz (85.2 kg), SpO2 100 %.         ECOG: 0    General appearance: Comfortable appearing without any discomfort Head: Normocephalic without any trauma Oropharynx: Mucous membranes are moist and pink without any thrush or ulcers. Eyes: Pupils are equal and round reactive to light. Lymph nodes: No cervical, supraclavicular, inguinal or axillary lymphadenopathy.   Heart:regular rate and rhythm.  S1 and S2 without leg edema. Lung: Clear without any rhonchi or wheezes.  No dullness to percussion. Abdomin: Soft, nontender, nondistended with good bowel sounds.  No hepatosplenomegaly. Musculoskeletal: No joint deformity or effusion.  Full range of motion noted. Neurological: No deficits noted on motor, sensory and deep tendon reflex exam. Skin: No petechial rash or dryness.  Appeared moist.  Lab Results: Lab Results  Component Value Date   WBC 6.9 11/29/2019   HGB 12.6 (L) 11/29/2019   HCT 38.2 (L) 11/29/2019   MCV 92.5 11/29/2019   PLT 215 11/29/2019     Chemistry      Component Value Date/Time   NA 139 11/29/2019 1157   NA 139 02/10/2017 1323   K 3.2 (L) 11/29/2019 1157   K 3.4 (L) 02/10/2017 1323   CL 104 11/29/2019 1157   CO2 28 11/29/2019 1157   CO2 23 02/10/2017 1323   BUN 16 11/29/2019 1157   BUN 13.6 02/10/2017 1323   CREATININE 0.83 11/29/2019 1157   CREATININE 0.9  02/10/2017 1323      Component Value Date/Time   CALCIUM 9.2 11/29/2019 1157   CALCIUM 9.2 02/10/2017 1323   ALKPHOS 50 11/29/2019 1157   ALKPHOS 50 02/10/2017 1323   AST 22 11/29/2019 1157   AST 20 02/10/2017 1323   ALT 16 11/29/2019 1157   ALT 15 02/10/2017 1323   BILITOT 0.7 11/29/2019 1157   BILITOT 0.52 02/10/2017 1323              Results for Philip [name] (MRN [id number]) as of 12/02/2019 09:28  Ref. Range 11/29/2019 11:57  Prostate Specific Ag, Serum Latest Ref Range: 0.0 - 4.0 ng/mL <0.1        Impression and Plan:  74 year old man with:  1. Advanced prostate cancer with disease to the bone and lymphadenopathy noted in 2016. He has castration-resistant at this time.      He is currently on Zytiga with a PSA currently undetectable. Risks and benefits of continuing this medication long-term were reviewed. Alternative options to treat his cancer were discussed including retreatment with chemotherapy versus Xofigo among others. These will be deferred unless he progresses in the future. The plan is to update his staging work-up prior to the next visit.   2. IV access: Port-A-Cath will be flushed periodically.  3. Hypertension: Blood pressure continues to be elevated periodically. I urged him to address this with his primary care physician. He has follow-up in the near future.  4.  Androgen depravation: He is status post orchiectomy without any additional androgen deprivation needed.  5.  Bone directed therapy: He has tolerated Xgeva without any new complaints. Long-term issues including hypocalcemia osteonecrosis of jaw were reiterated. He is agreeable to continue.  6.  Prognosis: His disease is incurable but aggressive measures are warranted given his excellent performance status.  7.  Tinnitus with occasional vertigo: He continues to follow with ENT regarding this issue.   8.  Depression and mood changes: Symptoms appear manageable at this time. We have  recommended counseling for him which he is considering.  9. Follow-up: He will return in 2 months for repeat follow-up.  30 minutes were dedicated to this encounter. Time was spent on reviewing his disease status, discussing treatment options and outlining future plan of care.  Zola Button, MD 10/1/20219:27 AM

## 2019-12-07 DIAGNOSIS — L821 Other seborrheic keratosis: Secondary | ICD-10-CM | POA: Diagnosis not present

## 2019-12-07 DIAGNOSIS — L570 Actinic keratosis: Secondary | ICD-10-CM | POA: Diagnosis not present

## 2019-12-07 DIAGNOSIS — L814 Other melanin hyperpigmentation: Secondary | ICD-10-CM | POA: Diagnosis not present

## 2019-12-07 DIAGNOSIS — D1801 Hemangioma of skin and subcutaneous tissue: Secondary | ICD-10-CM | POA: Diagnosis not present

## 2019-12-13 ENCOUNTER — Telehealth: Payer: Self-pay

## 2019-12-13 ENCOUNTER — Telehealth: Payer: Self-pay | Admitting: Nurse Practitioner
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
## 2019-12-13 NOTE — Telephone Encounter (Signed)
Scheduling message sent. Patient scheduled to be seen in symptom management.

## 2019-12-13 NOTE — Telephone Encounter (Signed)
Scheduled appt per sch msg. Called and spoke with patient. Confirmed appt  

## 2019-12-14 ENCOUNTER — Other Ambulatory Visit: Payer: Self-pay

## 2019-12-14 ENCOUNTER — Ambulatory Visit (HOSPITAL_COMMUNITY)
Admission: RE | Admit: 2019-12-14 | Discharge: 2019-12-14 | Disposition: A | Discharge status: Home / Self Care | Payer: Medicare Other | Source: Ambulatory Visit | Attending: Nurse Practitioner | Admitting: Nurse Practitioner

## 2019-12-14 ENCOUNTER — Inpatient Hospital Stay (HOSPITAL_BASED_OUTPATIENT_CLINIC_OR_DEPARTMENT_OTHER): Payer: Medicare Other | Admitting: Nurse Practitioner

## 2019-12-14 VITALS — BP 172/80 | HR 74 | Temp 98.0°F | Resp 18

## 2019-12-14 DIAGNOSIS — M25472 Effusion, left ankle: Secondary | ICD-10-CM | POA: Insufficient documentation

## 2019-12-14 DIAGNOSIS — M79642 Pain in left hand: Secondary | ICD-10-CM | POA: Insufficient documentation

## 2019-12-14 DIAGNOSIS — M25561 Pain in right knee: Secondary | ICD-10-CM | POA: Insufficient documentation

## 2019-12-14 DIAGNOSIS — M19041 Primary osteoarthritis, right hand: Secondary | ICD-10-CM | POA: Diagnosis not present

## 2019-12-14 DIAGNOSIS — C61 Malignant neoplasm of prostate: Secondary | ICD-10-CM

## 2019-12-14 DIAGNOSIS — M25562 Pain in left knee: Secondary | ICD-10-CM | POA: Diagnosis not present

## 2019-12-14 DIAGNOSIS — M79641 Pain in right hand: Secondary | ICD-10-CM | POA: Diagnosis not present

## 2019-12-14 DIAGNOSIS — I10 Essential (primary) hypertension: Secondary | ICD-10-CM | POA: Diagnosis not present

## 2019-12-14 DIAGNOSIS — M25462 Effusion, left knee: Secondary | ICD-10-CM | POA: Diagnosis not present

## 2019-12-14 DIAGNOSIS — C7951 Secondary malignant neoplasm of bone: Secondary | ICD-10-CM | POA: Diagnosis not present

## 2019-12-14 DIAGNOSIS — M879 Osteonecrosis, unspecified: Secondary | ICD-10-CM | POA: Diagnosis not present

## 2019-12-14 DIAGNOSIS — M25461 Effusion, right knee: Secondary | ICD-10-CM | POA: Diagnosis not present

## 2019-12-14 DIAGNOSIS — M7989 Other specified soft tissue disorders: Secondary | ICD-10-CM | POA: Diagnosis not present

## 2019-12-14 DIAGNOSIS — M1711 Unilateral primary osteoarthritis, right knee: Secondary | ICD-10-CM | POA: Diagnosis not present

## 2019-12-14 DIAGNOSIS — Z95828 Presence of other vascular implants and grafts: Secondary | ICD-10-CM | POA: Diagnosis not present

## 2019-12-14 DIAGNOSIS — F32A Depression, unspecified: Secondary | ICD-10-CM | POA: Diagnosis not present

## 2019-12-14 DIAGNOSIS — M1712 Unilateral primary osteoarthritis, left knee: Secondary | ICD-10-CM | POA: Diagnosis not present

## 2019-12-14 DIAGNOSIS — M2548 Effusion, other site: Secondary | ICD-10-CM | POA: Diagnosis not present

## 2019-12-14 DIAGNOSIS — H9319 Tinnitus, unspecified ear: Secondary | ICD-10-CM | POA: Diagnosis not present

## 2019-12-14 DIAGNOSIS — M19072 Primary osteoarthritis, left ankle and foot: Secondary | ICD-10-CM | POA: Diagnosis not present

## 2019-12-14 DIAGNOSIS — M1812 Unilateral primary osteoarthritis of first carpometacarpal joint, left hand: Secondary | ICD-10-CM | POA: Diagnosis not present

## 2019-12-14 DIAGNOSIS — M1811 Unilateral primary osteoarthritis of first carpometacarpal joint, right hand: Secondary | ICD-10-CM | POA: Diagnosis not present

## 2019-12-14 MED ORDER — SODIUM CHLORIDE 0.9% FLUSH
10.0000 mL | Freq: Once | INTRAVENOUS | Status: AC
  Administered 2019-12-14: 10 mL
  Filled 2019-12-14: qty 10

## 2019-12-14 MED ORDER — HEPARIN SOD (PORK) LOCK FLUSH 100 UNIT/ML IV SOLN
500.0000 [IU] | Freq: Once | INTRAVENOUS | Status: AC
  Administered 2019-12-14: 500 [IU]
  Filled 2019-12-14: qty 5
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
## 2019-12-14 NOTE — Patient Instructions (Signed)

## 2019-12-16 ENCOUNTER — Telehealth: Payer: Self-pay | Admitting: Emergency Medicine

## 2019-12-16 NOTE — Telephone Encounter (Signed)
Reported negative results of Xrays from 12/14/19 visit in [facility] per NP Burns Spain, pt verbalized understanding of results.  Pt advised to follow up with his PCP to discuss further results that show some possible arthritis.  Pt states he will be seeing his PCP on 10/26 and will talk to him about it then.

## 2019-12-27 ENCOUNTER — Ambulatory Visit (INDEPENDENT_AMBULATORY_CARE_PROVIDER_SITE_OTHER): Payer: Medicare Other

## 2019-12-27 ENCOUNTER — Encounter: Payer: Self-pay | Admitting: Internal Medicine

## 2019-12-27 ENCOUNTER — Other Ambulatory Visit: Payer: Self-pay

## 2019-12-27 ENCOUNTER — Ambulatory Visit (INDEPENDENT_AMBULATORY_CARE_PROVIDER_SITE_OTHER): Payer: Medicare Other | Admitting: Internal Medicine

## 2019-12-27 VITALS — BP 148/88 | HR 72 | Temp 98.3°F | Resp 16 | Ht 76.0 in | Wt 191.0 lb

## 2019-12-27 DIAGNOSIS — R2242 Localized swelling, mass and lump, left lower limb: Secondary | ICD-10-CM

## 2019-12-27 DIAGNOSIS — I10 Essential (primary) hypertension: Secondary | ICD-10-CM | POA: Diagnosis not present

## 2019-12-27 DIAGNOSIS — S299XXA Unspecified injury of thorax, initial encounter: Secondary | ICD-10-CM

## 2019-12-27 DIAGNOSIS — E876 Hypokalemia: Secondary | ICD-10-CM | POA: Diagnosis not present

## 2019-12-27 DIAGNOSIS — T502X5A Adverse effect of carbonic-anhydrase inhibitors, benzothiadiazides and other diuretics, initial encounter: Secondary | ICD-10-CM

## 2019-12-27 DIAGNOSIS — Z041 Encounter for examination and observation following transport accident: Secondary | ICD-10-CM | POA: Diagnosis not present

## 2019-12-27 LAB — BASIC METABOLIC PANEL
BUN: 17 mg/dL (ref 6–23)
CO2: 30 mEq/L (ref 19–32)
Calcium: 9.5 mg/dL (ref 8.4–10.5)
Chloride: 101 mEq/L (ref 96–112)
Creatinine, Ser: 0.85 mg/dL (ref 0.40–1.50)
GFR: 85.17 mL/min (ref >60.00)
Glucose, Bld: 82 mg/dL (ref 70–99)
Potassium: 3.4 mEq/L — ABNORMAL LOW (ref 3.5–5.1)
Sodium: 139 mEq/L (ref 135–145)

## 2019-12-27 LAB — MAGNESIUM: Magnesium: 2 mg/dL (ref 1.5–2.5)

## 2019-12-27 LAB — D-DIMER, QUANTITATIVE: D-Dimer, Quant: 0.57 mcg/mL FEU — ABNORMAL HIGH (ref <0.50)

## 2019-12-27 MED ORDER — POTASSIUM CHLORIDE CRYS ER 20 MEQ PO TBCR: 20.0000 meq | EXTENDED_RELEASE_TABLET | Freq: Two times a day (BID) | ORAL | 0 refills | Status: DC
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
## 2019-12-27 NOTE — Patient Instructions (Signed)

## 2019-12-28 ENCOUNTER — Ambulatory Visit (HOSPITAL_COMMUNITY)
Admission: RE | Admit: 2019-12-28 | Discharge: 2019-12-28 | Disposition: A | Discharge status: Home / Self Care | Payer: Medicare Other | Source: Ambulatory Visit | Attending: Internal Medicine | Admitting: Internal Medicine

## 2019-12-28 ENCOUNTER — Telehealth: Payer: Self-pay

## 2019-12-28 DIAGNOSIS — R2242 Localized swelling, mass and lump, left lower limb: Secondary | ICD-10-CM | POA: Diagnosis not present

## 2019-12-30 DIAGNOSIS — H2513 Age-related nuclear cataract, bilateral: Secondary | ICD-10-CM | POA: Diagnosis not present

## 2019-12-30 DIAGNOSIS — H25013 Cortical age-related cataract, bilateral: Secondary | ICD-10-CM | POA: Diagnosis not present

## 2019-12-30 DIAGNOSIS — H02834 Dermatochalasis of left upper eyelid: Secondary | ICD-10-CM | POA: Diagnosis not present

## 2019-12-30 DIAGNOSIS — H02831 Dermatochalasis of right upper eyelid: Secondary | ICD-10-CM | POA: Diagnosis not present

## 2020-01-06 NOTE — Telephone Encounter (Signed)
Message sent

## 2020-01-13 ENCOUNTER — Telehealth: Payer: Self-pay | Admitting: Oncology

## 2020-01-13 NOTE — Telephone Encounter (Signed)
Scheduled per los, patient has been called and notified. 

## 2020-01-25 ENCOUNTER — Encounter: Payer: Self-pay | Admitting: Internal Medicine

## 2020-01-30 ENCOUNTER — Other Ambulatory Visit: Payer: Self-pay | Admitting: Internal Medicine

## 2020-01-30 DIAGNOSIS — M153 Secondary multiple arthritis: Secondary | ICD-10-CM | POA: Insufficient documentation

## 2020-01-30 MED ORDER — MELOXICAM 7.5 MG PO TABS: 7.5000 mg | ORAL_TABLET | Freq: Every day | ORAL | 0 refills | Status: DC

## 2020-01-31 ENCOUNTER — Ambulatory Visit (HOSPITAL_COMMUNITY): Payer: Medicare Other

## 2020-01-31 ENCOUNTER — Inpatient Hospital Stay: Payer: Medicare Other

## 2020-01-31 ENCOUNTER — Encounter (HOSPITAL_COMMUNITY)
Admission: RE | Admit: 2020-01-31 | Discharge: 2020-01-31 | Disposition: A | Discharge status: Home / Self Care | Payer: Medicare Other | Source: Ambulatory Visit | Attending: Oncology | Admitting: Oncology

## 2020-01-31 ENCOUNTER — Other Ambulatory Visit (HOSPITAL_COMMUNITY): Payer: Medicare Other

## 2020-01-31 ENCOUNTER — Other Ambulatory Visit: Payer: Self-pay

## 2020-01-31 ENCOUNTER — Inpatient Hospital Stay: Payer: Medicare Other | Attending: Oncology

## 2020-01-31 DIAGNOSIS — C61 Malignant neoplasm of prostate: Secondary | ICD-10-CM | POA: Insufficient documentation

## 2020-01-31 DIAGNOSIS — Z95828 Presence of other vascular implants and grafts: Secondary | ICD-10-CM

## 2020-01-31 LAB — CMP (CANCER CENTER ONLY)
ALT: 16 U/L (ref 0–44)
AST: 23 U/L (ref 15–41)
Albumin: 3.8 g/dL (ref 3.5–5.0)
Alkaline Phosphatase: 46 U/L (ref 38–126)
Anion gap: 12 (ref 5–15)
BUN: 20 mg/dL (ref 8–23)
CO2: 22 mmol/L (ref 22–32)
Calcium: 9.4 mg/dL (ref 8.9–10.3)
Chloride: 104 mmol/L (ref 98–111)
Creatinine: 0.86 mg/dL (ref 0.61–1.24)
GFR, Estimated: >60 mL/min (ref >60)
Glucose, Bld: 94 mg/dL (ref 70–99)
Potassium: 3.8 mmol/L (ref 3.5–5.1)
Sodium: 138 mmol/L (ref 135–145)
Total Bilirubin: 0.9 mg/dL (ref 0.3–1.2)
Total Protein: 6.8 g/dL (ref 6.5–8.1)

## 2020-01-31 LAB — CBC WITH DIFFERENTIAL (CANCER CENTER ONLY)
Abs Immature Granulocytes: 0.01 10*3/uL (ref 0.00–0.07)
Basophils Absolute: 0 10*3/uL (ref 0.0–0.1)
Basophils Relative: 1 %
Eosinophils Absolute: 0.1 10*3/uL (ref 0.0–0.5)
Eosinophils Relative: 1 %
HCT: 37.7 % — ABNORMAL LOW (ref 39.0–52.0)
Hemoglobin: 12.7 g/dL — ABNORMAL LOW (ref 13.0–17.0)
Immature Granulocytes: 0 %
Lymphocytes Relative: 27 %
Lymphs Abs: 1.7 10*3/uL (ref 0.7–4.0)
MCH: 31.5 pg (ref 26.0–34.0)
MCHC: 33.7 g/dL (ref 30.0–36.0)
MCV: 93.5 fL (ref 80.0–100.0)
Monocytes Absolute: 0.6 10*3/uL (ref 0.1–1.0)
Monocytes Relative: 10 %
Neutro Abs: 3.8 10*3/uL (ref 1.7–7.7)
Neutrophils Relative %: 61 %
Platelet Count: 206 10*3/uL (ref 150–400)
RBC: 4.03 MIL/uL — ABNORMAL LOW (ref 4.22–5.81)
RDW: 13.1 % (ref 11.5–15.5)
WBC Count: 6.3 10*3/uL (ref 4.0–10.5)
nRBC: 0 % (ref 0.0–0.2)

## 2020-01-31 MED ORDER — TECHNETIUM TC 99M MEDRONATE IV KIT
20.0000 | PACK | Freq: Once | INTRAVENOUS | Status: AC | PRN
  Administered 2020-01-31: 19.8 via INTRAVENOUS

## 2020-01-31 MED ORDER — HEPARIN SOD (PORK) LOCK FLUSH 100 UNIT/ML IV SOLN
500.0000 [IU] | Freq: Once | INTRAVENOUS | Status: DC
  Filled 2020-01-31: qty 5

## 2020-01-31 MED ORDER — SODIUM CHLORIDE 0.9% FLUSH
10.0000 mL | Freq: Once | INTRAVENOUS | Status: AC
  Administered 2020-01-31: 10 mL
  Filled 2020-01-31: qty 10

## 2020-01-31 NOTE — Patient Instructions (Signed)

## 2020-02-01 ENCOUNTER — Encounter (HOSPITAL_COMMUNITY): Payer: Self-pay

## 2020-02-01 ENCOUNTER — Ambulatory Visit (HOSPITAL_COMMUNITY)
Admission: RE | Admit: 2020-02-01 | Discharge: 2020-02-01 | Disposition: A | Discharge status: Home / Self Care | Payer: Medicare Other | Source: Ambulatory Visit | Attending: Oncology | Admitting: Oncology

## 2020-02-01 DIAGNOSIS — C61 Malignant neoplasm of prostate: Secondary | ICD-10-CM | POA: Diagnosis not present

## 2020-02-01 DIAGNOSIS — Z041 Encounter for examination and observation following transport accident: Secondary | ICD-10-CM | POA: Diagnosis not present

## 2020-02-01 HISTORY — DX: Essential (primary) hypertension: I10

## 2020-02-01 LAB — PROSTATE-SPECIFIC AG, SERUM (LABCORP): Prostate Specific Ag, Serum: <0.1 ng/mL (ref 0.0–4.0)

## 2020-02-01 MED ORDER — IOHEXOL 300 MG/ML  SOLN
100.0000 mL | Freq: Once | INTRAMUSCULAR | Status: AC | PRN
  Administered 2020-02-01: 100 mL via INTRAVENOUS

## 2020-02-01 MED ORDER — HEPARIN SOD (PORK) LOCK FLUSH 100 UNIT/ML IV SOLN
500.0000 [IU] | Freq: Once | INTRAVENOUS | Status: AC
  Administered 2020-02-01: 500 [IU] via INTRAVENOUS

## 2020-02-01 MED ORDER — HEPARIN SOD (PORK) LOCK FLUSH 100 UNIT/ML IV SOLN
INTRAVENOUS | Status: AC
  Filled 2020-02-01: qty 5

## 2020-02-03 ENCOUNTER — Inpatient Hospital Stay: Payer: Medicare Other

## 2020-02-03 ENCOUNTER — Inpatient Hospital Stay: Payer: Medicare Other | Attending: Oncology | Admitting: Oncology

## 2020-02-03 ENCOUNTER — Other Ambulatory Visit: Payer: Self-pay

## 2020-02-03 VITALS — BP 150/96 | HR 73 | Resp 18 | Ht 76.0 in | Wt 188.6 lb

## 2020-02-03 DIAGNOSIS — I10 Essential (primary) hypertension: Secondary | ICD-10-CM | POA: Insufficient documentation

## 2020-02-03 DIAGNOSIS — Z9079 Acquired absence of other genital organ(s): Secondary | ICD-10-CM | POA: Insufficient documentation

## 2020-02-03 DIAGNOSIS — I700 Atherosclerosis of aorta: Secondary | ICD-10-CM | POA: Insufficient documentation

## 2020-02-03 DIAGNOSIS — F418 Other specified anxiety disorders: Secondary | ICD-10-CM | POA: Diagnosis not present

## 2020-02-03 DIAGNOSIS — Z87828 Personal history of other (healed) physical injury and trauma: Secondary | ICD-10-CM | POA: Insufficient documentation

## 2020-02-03 DIAGNOSIS — C7951 Secondary malignant neoplasm of bone: Secondary | ICD-10-CM | POA: Diagnosis not present

## 2020-02-03 DIAGNOSIS — Z7982 Long term (current) use of aspirin: Secondary | ICD-10-CM | POA: Insufficient documentation

## 2020-02-03 DIAGNOSIS — C61 Malignant neoplasm of prostate: Secondary | ICD-10-CM

## 2020-02-03 DIAGNOSIS — Z791 Long term (current) use of non-steroidal anti-inflammatories (NSAID): Secondary | ICD-10-CM | POA: Insufficient documentation

## 2020-02-03 DIAGNOSIS — Z79899 Other long term (current) drug therapy: Secondary | ICD-10-CM | POA: Diagnosis not present

## 2020-02-03 DIAGNOSIS — Z9221 Personal history of antineoplastic chemotherapy: Secondary | ICD-10-CM | POA: Insufficient documentation

## 2020-02-03 DIAGNOSIS — Z95828 Presence of other vascular implants and grafts: Secondary | ICD-10-CM

## 2020-02-03 DIAGNOSIS — R590 Localized enlarged lymph nodes: Secondary | ICD-10-CM | POA: Insufficient documentation

## 2020-02-03 MED ORDER — DENOSUMAB 120 MG/1.7ML ~~LOC~~ SOLN
SUBCUTANEOUS | Status: AC
  Filled 2020-02-03: qty 1.7

## 2020-02-03 MED ORDER — DENOSUMAB 120 MG/1.7ML ~~LOC~~ SOLN
120.0000 mg | Freq: Once | SUBCUTANEOUS | Status: AC
  Administered 2020-02-03: 120 mg via SUBCUTANEOUS

## 2020-02-03 NOTE — Patient Instructions (Signed)
Denosumab injection What is this medicine? DENOSUMAB (den oh sue mab) slows bone breakdown. Prolia is used to treat osteoporosis in women after menopause and in men, and in people who are taking corticosteroids for 6 months or more. Xgeva is used to treat a high calcium level due to cancer and to prevent bone fractures and other bone problems caused by multiple myeloma or cancer bone metastases. Xgeva is also used to treat giant cell tumor of the bone. This medicine may be used for other purposes; ask your health care provider or pharmacist if you have questions. COMMON BRAND NAME(S): Prolia, XGEVA What should I tell my health care provider before I take this medicine? They need to know if you have any of these conditions:  dental disease  having surgery or tooth extraction  infection  kidney disease  low levels of calcium or Vitamin D in the blood  malnutrition  on hemodialysis  skin conditions or sensitivity  thyroid or parathyroid disease  an unusual reaction to denosumab, other medicines, foods, dyes, or preservatives  pregnant or trying to get pregnant  breast-feeding How should I use this medicine? This medicine is for injection under the skin. It is given by a health care professional in a hospital or clinic setting. A special MedGuide will be given to you before each treatment. Be sure to read this information carefully each time. For Prolia, talk to your pediatrician regarding the use of this medicine in children. Special care may be needed. For Xgeva, talk to your pediatrician regarding the use of this medicine in children. While this drug may be prescribed for children as young as 13 years for selected conditions, precautions do apply. Overdosage: If you think you have taken too much of this medicine contact a poison control center or emergency room at once. NOTE: This medicine is only for you. Do not share this medicine with others. What if I miss a dose? It is  important not to miss your dose. Call your doctor or health care professional if you are unable to keep an appointment. What may interact with this medicine? Do not take this medicine with any of the following medications:  other medicines containing denosumab This medicine may also interact with the following medications:  medicines that lower your chance of fighting infection  steroid medicines like prednisone or cortisone This list may not describe all possible interactions. Give your health care provider a list of all the medicines, herbs, non-prescription drugs, or dietary supplements you use. Also tell them if you smoke, drink alcohol, or use illegal drugs. Some items may interact with your medicine. What should I watch for while using this medicine? Visit your doctor or health care professional for regular checks on your progress. Your doctor or health care professional may order blood tests and other tests to see how you are doing. Call your doctor or health care professional for advice if you get a fever, chills or sore throat, or other symptoms of a cold or flu. Do not treat yourself. This drug may decrease your body's ability to fight infection. Try to avoid being around people who are sick. You should make sure you get enough calcium and vitamin D while you are taking this medicine, unless your doctor tells you not to. Discuss the foods you eat and the vitamins you take with your health care professional. See your dentist regularly. Brush and floss your teeth as directed. Before you have any dental work done, tell your dentist you are   receiving this medicine. Do not become pregnant while taking this medicine or for 5 months after stopping it. Talk with your doctor or health care professional about your birth control options while taking this medicine. Women should inform their doctor if they wish to become pregnant or think they might be pregnant. There is a potential for serious side  effects to an unborn child. Talk to your health care professional or pharmacist for more information. What side effects may I notice from receiving this medicine? Side effects that you should report to your doctor or health care professional as soon as possible:  allergic reactions like skin rash, itching or hives, swelling of the face, lips, or tongue  bone pain  breathing problems  dizziness  jaw pain, especially after dental work  redness, blistering, peeling of the skin  signs and symptoms of infection like fever or chills; cough; sore throat; pain or trouble passing urine  signs of low calcium like fast heartbeat, muscle cramps or muscle pain; pain, tingling, numbness in the hands or feet; seizures  unusual bleeding or bruising  unusually weak or tired Side effects that usually do not require medical attention (report to your doctor or health care professional if they continue or are bothersome):  constipation  diarrhea  headache  joint pain  loss of appetite  muscle pain  runny nose  tiredness  upset stomach This list may not describe all possible side effects. Call your doctor for medical advice about side effects. You may report side effects to FDA at 1-800-FDA-1088. Where should I keep my medicine? This medicine is only given in a clinic, doctor's office, or other health care setting and will not be stored at home. NOTE: This sheet is a summary. It may not cover all possible information. If you have questions about this medicine, talk to your doctor, pharmacist, or health care provider.  2020 [employer]vier/Gold Standard (2017-06-26 16:10:44)
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
## 2020-02-05 ENCOUNTER — Other Ambulatory Visit: Payer: Self-pay | Admitting: Internal Medicine

## 2020-02-05 DIAGNOSIS — I10 Essential (primary) hypertension: Secondary | ICD-10-CM

## 2020-02-08 ENCOUNTER — Other Ambulatory Visit: Payer: Self-pay

## 2020-02-08 ENCOUNTER — Ambulatory Visit (INDEPENDENT_AMBULATORY_CARE_PROVIDER_SITE_OTHER): Payer: Medicare Other | Admitting: Internal Medicine

## 2020-02-08 ENCOUNTER — Encounter: Payer: Self-pay | Admitting: Internal Medicine

## 2020-02-08 VITALS — BP 142/82 | HR 72 | Temp 98.1°F | Resp 16 | Ht 76.0 in | Wt 190.0 lb

## 2020-02-08 DIAGNOSIS — I10 Essential (primary) hypertension: Secondary | ICD-10-CM | POA: Diagnosis not present

## 2020-02-08 DIAGNOSIS — M172 Bilateral post-traumatic osteoarthritis of knee: Secondary | ICD-10-CM | POA: Insufficient documentation

## 2020-02-08 MED ORDER — TRAMADOL HCL 50 MG PO TABS: 50.0000 mg | ORAL_TABLET | Freq: Two times a day (BID) | ORAL | 1 refills | Status: DC | PRN

## 2020-02-08 NOTE — Patient Instructions (Signed)
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
## 2020-02-12 ENCOUNTER — Other Ambulatory Visit: Payer: Self-pay | Admitting: Internal Medicine

## 2020-02-12 DIAGNOSIS — E876 Hypokalemia: Secondary | ICD-10-CM

## 2020-02-12 DIAGNOSIS — I10 Essential (primary) hypertension: Secondary | ICD-10-CM

## 2020-02-14 ENCOUNTER — Encounter: Payer: Self-pay | Admitting: Oncology

## 2020-03-06 ENCOUNTER — Other Ambulatory Visit: Payer: Self-pay | Admitting: Pharmacist

## 2020-03-06 DIAGNOSIS — C61 Malignant neoplasm of prostate: Secondary | ICD-10-CM

## 2020-03-06 MED ORDER — ABIRATERONE ACETATE 250 MG PO TABS: ORAL_TABLET | ORAL | 10 refills | Status: DC

## 2020-03-06 NOTE — Progress Notes (Signed)
Oral Oncology Pharmacist Encounter  Prescription refill request for received from [facility] for Zytiga. OK per Dr. Clelia Croft to refill. Refill sent to [facility].    Lenord Carbo, PharmD, BCPS Hematology/Oncology Clinical Pharmacist [facility] [phone] 03/06/2020 8:59 AM

## 2020-04-04 ENCOUNTER — Inpatient Hospital Stay: Payer: Medicare Other | Attending: Oncology

## 2020-04-04 ENCOUNTER — Inpatient Hospital Stay: Payer: Medicare Other

## 2020-04-04 ENCOUNTER — Other Ambulatory Visit: Payer: Self-pay

## 2020-04-04 DIAGNOSIS — Z7982 Long term (current) use of aspirin: Secondary | ICD-10-CM | POA: Diagnosis not present

## 2020-04-04 DIAGNOSIS — Z9079 Acquired absence of other genital organ(s): Secondary | ICD-10-CM | POA: Diagnosis not present

## 2020-04-04 DIAGNOSIS — I10 Essential (primary) hypertension: Secondary | ICD-10-CM | POA: Insufficient documentation

## 2020-04-04 DIAGNOSIS — C7951 Secondary malignant neoplasm of bone: Secondary | ICD-10-CM | POA: Insufficient documentation

## 2020-04-04 DIAGNOSIS — C61 Malignant neoplasm of prostate: Secondary | ICD-10-CM | POA: Diagnosis not present

## 2020-04-04 DIAGNOSIS — Z9221 Personal history of antineoplastic chemotherapy: Secondary | ICD-10-CM | POA: Diagnosis not present

## 2020-04-04 DIAGNOSIS — Z79899 Other long term (current) drug therapy: Secondary | ICD-10-CM | POA: Diagnosis not present

## 2020-04-04 DIAGNOSIS — Z95828 Presence of other vascular implants and grafts: Secondary | ICD-10-CM

## 2020-04-04 LAB — CBC WITH DIFFERENTIAL (CANCER CENTER ONLY)
Abs Immature Granulocytes: 0.01 10*3/uL (ref 0.00–0.07)
Basophils Absolute: 0 10*3/uL (ref 0.0–0.1)
Basophils Relative: 1 %
Eosinophils Absolute: 0.1 10*3/uL (ref 0.0–0.5)
Eosinophils Relative: 2 %
HCT: 37 % — ABNORMAL LOW (ref 39.0–52.0)
Hemoglobin: 12.4 g/dL — ABNORMAL LOW (ref 13.0–17.0)
Immature Granulocytes: 0 %
Lymphocytes Relative: 34 %
Lymphs Abs: 1.7 10*3/uL (ref 0.7–4.0)
MCH: 31.6 pg (ref 26.0–34.0)
MCHC: 33.5 g/dL (ref 30.0–36.0)
MCV: 94.4 fL (ref 80.0–100.0)
Monocytes Absolute: 0.6 10*3/uL (ref 0.1–1.0)
Monocytes Relative: 11 %
Neutro Abs: 2.6 10*3/uL (ref 1.7–7.7)
Neutrophils Relative %: 52 %
Platelet Count: 186 10*3/uL (ref 150–400)
RBC: 3.92 MIL/uL — ABNORMAL LOW (ref 4.22–5.81)
RDW: 12.8 % (ref 11.5–15.5)
WBC Count: 5 10*3/uL (ref 4.0–10.5)
nRBC: 0 % (ref 0.0–0.2)

## 2020-04-04 LAB — CMP (CANCER CENTER ONLY)
ALT: 16 U/L (ref 0–44)
AST: 19 U/L (ref 15–41)
Albumin: 3.7 g/dL (ref 3.5–5.0)
Alkaline Phosphatase: 45 U/L (ref 38–126)
Anion gap: 7 (ref 5–15)
BUN: 20 mg/dL (ref 8–23)
CO2: 27 mmol/L (ref 22–32)
Calcium: 9 mg/dL (ref 8.9–10.3)
Chloride: 105 mmol/L (ref 98–111)
Creatinine: 1.02 mg/dL (ref 0.61–1.24)
GFR, Estimated: >60 mL/min (ref >60)
Glucose, Bld: 97 mg/dL (ref 70–99)
Potassium: 4 mmol/L (ref 3.5–5.1)
Sodium: 139 mmol/L (ref 135–145)
Total Bilirubin: 0.7 mg/dL (ref 0.3–1.2)
Total Protein: 6.4 g/dL — ABNORMAL LOW (ref 6.5–8.1)

## 2020-04-04 MED ORDER — HEPARIN SOD (PORK) LOCK FLUSH 100 UNIT/ML IV SOLN
500.0000 [IU] | Freq: Once | INTRAVENOUS | Status: AC
  Administered 2020-04-04: 500 [IU]
  Filled 2020-04-04: qty 5

## 2020-04-04 MED ORDER — SODIUM CHLORIDE 0.9% FLUSH
10.0000 mL | Freq: Once | INTRAVENOUS | Status: AC
  Administered 2020-04-04: 10 mL
  Filled 2020-04-04: qty 10

## 2020-04-05 LAB — PROSTATE-SPECIFIC AG, SERUM (LABCORP): Prostate Specific Ag, Serum: <0.1 ng/mL (ref 0.0–4.0)

## 2020-04-06 ENCOUNTER — Ambulatory Visit: Payer: Medicare Other | Admitting: Oncology

## 2020-04-06 ENCOUNTER — Other Ambulatory Visit: Payer: Self-pay

## 2020-04-06 ENCOUNTER — Inpatient Hospital Stay (HOSPITAL_BASED_OUTPATIENT_CLINIC_OR_DEPARTMENT_OTHER): Payer: Medicare Other | Admitting: Oncology

## 2020-04-06 ENCOUNTER — Inpatient Hospital Stay: Payer: Medicare Other

## 2020-04-06 VITALS — BP 156/82 | HR 72 | Temp 97.4°F | Resp 17 | Ht 76.0 in | Wt 192.7 lb

## 2020-04-06 DIAGNOSIS — C61 Malignant neoplasm of prostate: Secondary | ICD-10-CM

## 2020-04-06 DIAGNOSIS — C7951 Secondary malignant neoplasm of bone: Secondary | ICD-10-CM | POA: Diagnosis not present

## 2020-04-06 DIAGNOSIS — Z9221 Personal history of antineoplastic chemotherapy: Secondary | ICD-10-CM | POA: Diagnosis not present

## 2020-04-06 DIAGNOSIS — I10 Essential (primary) hypertension: Secondary | ICD-10-CM | POA: Diagnosis not present

## 2020-04-06 DIAGNOSIS — Z9079 Acquired absence of other genital organ(s): Secondary | ICD-10-CM | POA: Diagnosis not present

## 2020-04-06 DIAGNOSIS — Z95828 Presence of other vascular implants and grafts: Secondary | ICD-10-CM

## 2020-04-06 MED ORDER — DENOSUMAB 120 MG/1.7ML ~~LOC~~ SOLN
SUBCUTANEOUS | Status: AC
  Filled 2020-04-06: qty 1.7

## 2020-04-06 MED ORDER — DENOSUMAB 120 MG/1.7ML ~~LOC~~ SOLN
120.0000 mg | Freq: Once | SUBCUTANEOUS | Status: AC
  Administered 2020-04-06: 120 mg via SUBCUTANEOUS
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
## 2020-04-06 NOTE — Patient Instructions (Signed)
Denosumab injection What is this medicine? DENOSUMAB (den oh sue mab) slows bone breakdown. Prolia is used to treat osteoporosis in women after menopause and in men, and in people who are taking corticosteroids for 6 months or more. Xgeva is used to treat a high calcium level due to cancer and to prevent bone fractures and other bone problems caused by multiple myeloma or cancer bone metastases. Xgeva is also used to treat giant cell tumor of the bone. This medicine may be used for other purposes; ask your health care provider or pharmacist if you have questions. COMMON BRAND NAME(S): Prolia, XGEVA What should I tell my health care provider before I take this medicine? They need to know if you have any of these conditions:  dental disease  having surgery or tooth extraction  infection  kidney disease  low levels of calcium or Vitamin D in the blood  malnutrition  on hemodialysis  skin conditions or sensitivity  thyroid or parathyroid disease  an unusual reaction to denosumab, other medicines, foods, dyes, or preservatives  pregnant or trying to get pregnant  breast-feeding How should I use this medicine? This medicine is for injection under the skin. It is given by a health care professional in a hospital or clinic setting. A special MedGuide will be given to you before each treatment. Be sure to read this information carefully each time. For Prolia, talk to your pediatrician regarding the use of this medicine in children. Special care may be needed. For Xgeva, talk to your pediatrician regarding the use of this medicine in children. While this drug may be prescribed for children as young as 13 years for selected conditions, precautions do apply. Overdosage: If you think you have taken too much of this medicine contact a poison control center or emergency room at once. NOTE: This medicine is only for you. Do not share this medicine with others. What if I miss a dose? It is  important not to miss your dose. Call your doctor or health care professional if you are unable to keep an appointment. What may interact with this medicine? Do not take this medicine with any of the following medications:  other medicines containing denosumab This medicine may also interact with the following medications:  medicines that lower your chance of fighting infection  steroid medicines like prednisone or cortisone This list may not describe all possible interactions. Give your health care provider a list of all the medicines, herbs, non-prescription drugs, or dietary supplements you use. Also tell them if you smoke, drink alcohol, or use illegal drugs. Some items may interact with your medicine. What should I watch for while using this medicine? Visit your doctor or health care professional for regular checks on your progress. Your doctor or health care professional may order blood tests and other tests to see how you are doing. Call your doctor or health care professional for advice if you get a fever, chills or sore throat, or other symptoms of a cold or flu. Do not treat yourself. This drug may decrease your body's ability to fight infection. Try to avoid being around people who are sick. You should make sure you get enough calcium and vitamin D while you are taking this medicine, unless your doctor tells you not to. Discuss the foods you eat and the vitamins you take with your health care professional. See your dentist regularly. Brush and floss your teeth as directed. Before you have any dental work done, tell your dentist you are   receiving this medicine. Do not become pregnant while taking this medicine or for 5 months after stopping it. Talk with your doctor or health care professional about your birth control options while taking this medicine. Women should inform their doctor if they wish to become pregnant or think they might be pregnant. There is a potential for serious side  effects to an unborn child. Talk to your health care professional or pharmacist for more information. What side effects may I notice from receiving this medicine? Side effects that you should report to your doctor or health care professional as soon as possible:  allergic reactions like skin rash, itching or hives, swelling of the face, lips, or tongue  bone pain  breathing problems  dizziness  jaw pain, especially after dental work  redness, blistering, peeling of the skin  signs and symptoms of infection like fever or chills; cough; sore throat; pain or trouble passing urine  signs of low calcium like fast heartbeat, muscle cramps or muscle pain; pain, tingling, numbness in the hands or feet; seizures  unusual bleeding or bruising  unusually weak or tired Side effects that usually do not require medical attention (report to your doctor or health care professional if they continue or are bothersome):  constipation  diarrhea  headache  joint pain  loss of appetite  muscle pain  runny nose  tiredness  upset stomach This list may not describe all possible side effects. Call your doctor for medical advice about side effects. You may report side effects to FDA at 1-800-FDA-1088. Where should I keep my medicine? This medicine is only given in a clinic, doctor's office, or other health care setting and will not be stored at home. NOTE: This sheet is a summary. It may not cover all possible information. If you have questions about this medicine, talk to your doctor, pharmacist, or health care provider.  2021 Elsevier/Gold Standard (2017-06-26 16:10:44)

## 2020-04-09 ENCOUNTER — Encounter: Payer: Self-pay | Admitting: Oncology

## 2020-04-22 ENCOUNTER — Other Ambulatory Visit: Payer: Self-pay | Admitting: Internal Medicine

## 2020-04-22 DIAGNOSIS — M153 Secondary multiple arthritis: Secondary | ICD-10-CM

## 2020-05-01 ENCOUNTER — Other Ambulatory Visit: Payer: Self-pay | Admitting: Internal Medicine

## 2020-05-01 DIAGNOSIS — I10 Essential (primary) hypertension: Secondary | ICD-10-CM

## 2020-05-09 ENCOUNTER — Other Ambulatory Visit: Payer: Self-pay

## 2020-05-09 ENCOUNTER — Ambulatory Visit: Payer: Medicare Other

## 2020-05-09 ENCOUNTER — Encounter: Payer: Self-pay | Admitting: Internal Medicine

## 2020-05-09 ENCOUNTER — Ambulatory Visit (INDEPENDENT_AMBULATORY_CARE_PROVIDER_SITE_OTHER): Payer: Medicare Other | Admitting: Internal Medicine

## 2020-05-09 VITALS — BP 144/88 | HR 79 | Temp 98.5°F | Resp 16 | Ht 76.0 in | Wt 190.0 lb

## 2020-05-09 DIAGNOSIS — E785 Hyperlipidemia, unspecified: Secondary | ICD-10-CM

## 2020-05-09 DIAGNOSIS — I700 Atherosclerosis of aorta: Secondary | ICD-10-CM | POA: Diagnosis not present

## 2020-05-09 DIAGNOSIS — I10 Essential (primary) hypertension: Secondary | ICD-10-CM

## 2020-05-09 DIAGNOSIS — C61 Malignant neoplasm of prostate: Secondary | ICD-10-CM

## 2020-05-09 LAB — LIPID PANEL
Cholesterol: 181 mg/dL (ref 0–200)
HDL: 72.7 mg/dL (ref >39.00)
NonHDL: 108.01
Total CHOL/HDL Ratio: 2
Triglycerides: 220 mg/dL — ABNORMAL HIGH (ref 0.0–149.0)
VLDL: 44 mg/dL — ABNORMAL HIGH (ref 0.0–40.0)

## 2020-05-09 LAB — TSH: TSH: 1.44 u[IU]/mL (ref 0.35–4.50)

## 2020-05-09 LAB — PSA: PSA: 0.02 ng/mL — ABNORMAL LOW (ref 0.10–4.00)

## 2020-05-09 LAB — LDL CHOLESTEROL, DIRECT: Direct LDL: 75 mg/dL

## 2020-05-09 NOTE — Patient Instructions (Signed)
High Cholesterol  High cholesterol is a condition in which the blood has high levels of a white, waxy substance similar to fat (cholesterol). The liver makes all the cholesterol that the body needs. The human body needs small amounts of cholesterol to help build cells. A person gets extra or excess cholesterol from the food that he or she eats. The blood carries cholesterol from the liver to the rest of the body. If you have high cholesterol, deposits (plaques) may build up on the walls of your arteries. Arteries are the blood vessels that carry blood away from your heart. These plaques make the arteries narrow and stiff. Cholesterol plaques increase your risk for heart attack and stroke. Work with your health care provider to keep your cholesterol levels in a healthy range. What increases the risk? The following factors may make you more likely to develop this condition:  Eating foods that are high in animal fat (saturated fat) or cholesterol.  Being overweight.  Not getting enough exercise.  A family history of high cholesterol (familial hypercholesterolemia).  Use of tobacco products.  Having diabetes. What are the signs or symptoms? There are no symptoms of this condition. How is this diagnosed? This condition may be diagnosed based on the results of a blood test.  If you are older than 76 years of age, your health care provider may check your cholesterol levels every 4-6 years.  You may be checked more often if you have high cholesterol or other risk factors for heart disease. The blood test for cholesterol measures:  "Bad" cholesterol, or LDL cholesterol. This is the main type of cholesterol that causes heart disease. The desired level is less than 100 mg/dL.  "Good" cholesterol, or HDL cholesterol. HDL helps protect against heart disease by cleaning the arteries and carrying the LDL to the liver for processing. The desired level for HDL is 60 mg/dL or higher.  Triglycerides.  These are fats that your body can store or burn for energy. The desired level is less than 150 mg/dL.  Total cholesterol. This measures the total amount of cholesterol in your blood and includes LDL, HDL, and triglycerides. The desired level is less than 200 mg/dL. How is this treated? This condition may be treated with:  Diet changes. You may be asked to eat foods that have more fiber and less saturated fats or added sugar.  Lifestyle changes. These may include regular exercise, maintaining a healthy weight, and quitting use of tobacco products.  Medicines. These are given when diet and lifestyle changes have not worked. You may be prescribed a statin medicine to help lower your cholesterol levels. Follow these instructions at home: Eating and drinking  Eat a healthy, balanced diet. This diet includes: ? Daily servings of a variety of fresh, frozen, or canned fruits and vegetables. ? Daily servings of whole grain foods that are rich in fiber. ? Foods that are low in saturated fats and trans fats. These include poultry and fish without skin, lean cuts of meat, and low-fat dairy products. ? A variety of fish, especially oily fish that contain omega-3 fatty acids. Aim to eat fish at least 2 times a week.  Avoid foods and drinks that have added sugar.  Use healthy cooking methods, such as roasting, grilling, broiling, baking, poaching, steaming, and stir-frying. Do not fry your food except for stir-frying.   Lifestyle  Get regular exercise. Aim to exercise for a total of 150 minutes a week. Increase your activity level by doing activities   such as gardening, walking, and taking the stairs.  Do not use any products that contain nicotine or tobacco, such as cigarettes, e-cigarettes, and chewing tobacco. If you need help quitting, ask your health care provider.   General instructions  Take over-the-counter and prescription medicines only as told by your health care provider.  Keep all  follow-up visits as told by your health care provider. This is important. Where to find more information  American Heart Association: www.heart.org  National Heart, Lung, and Blood Institute: www.nhlbi.nih.gov Contact a health care provider if:  You have trouble achieving or maintaining a healthy diet or weight.  You are starting an exercise program.  You are unable to stop smoking. Get help right away if:  You have chest pain.  You have trouble breathing.  You have any symptoms of a stroke. "BE FAST" is an easy way to remember the main warning signs of a stroke: ? B - Balance. Signs are dizziness, sudden trouble walking, or loss of balance. ? E - Eyes. Signs are trouble seeing or a sudden change in vision. ? F - Face. Signs are sudden weakness or numbness of the face, or the face or eyelid drooping on one side. ? A - Arms. Signs are weakness or numbness in an arm. This happens suddenly and usually on one side of the body. ? S - Speech. Signs are sudden trouble speaking, slurred speech, or trouble understanding what people say. ? T - Time. Time to call emergency services. Write down what time symptoms started.  You have other signs of a stroke, such as: ? A sudden, severe headache with no known cause. ? Nausea or vomiting. ? Seizure. These symptoms may represent a serious problem that is an emergency. Do not wait to see if the symptoms will go away. Get medical help right away. Call your local emergency services (911 in the [place] Do not drive yourself to the hospital. Summary  Cholesterol plaques increase your risk for heart attack and stroke. Work with your health care provider to keep your cholesterol levels in a healthy range.  Eat a healthy, balanced diet, get regular exercise, and maintain a healthy weight.  Do not use any products that contain nicotine or tobacco, such as cigarettes, e-cigarettes, and chewing tobacco.  Get help right away if you have any symptoms of a  stroke. This information is not intended to replace advice given to you by your health care provider. Make sure you discuss any questions you have with your health care provider. Document Revised: 01/17/2019 Document Reviewed: 01/17/2019 Elsevier Patient Education  2021 Elsevier Inc.  
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
## 2020-05-18 ENCOUNTER — Encounter: Payer: Self-pay | Admitting: Oncology

## 2020-05-29 ENCOUNTER — Inpatient Hospital Stay: Payer: Medicare Other

## 2020-05-29 ENCOUNTER — Inpatient Hospital Stay: Payer: Medicare Other | Attending: Oncology

## 2020-05-29 ENCOUNTER — Other Ambulatory Visit: Payer: Self-pay

## 2020-05-29 DIAGNOSIS — Z95828 Presence of other vascular implants and grafts: Secondary | ICD-10-CM

## 2020-05-29 DIAGNOSIS — C61 Malignant neoplasm of prostate: Secondary | ICD-10-CM

## 2020-05-29 DIAGNOSIS — C7951 Secondary malignant neoplasm of bone: Secondary | ICD-10-CM | POA: Diagnosis not present

## 2020-05-29 LAB — CBC WITH DIFFERENTIAL (CANCER CENTER ONLY)
Abs Immature Granulocytes: 0.01 10*3/uL (ref 0.00–0.07)
Basophils Absolute: 0 10*3/uL (ref 0.0–0.1)
Basophils Relative: 1 %
Eosinophils Absolute: 0.2 10*3/uL (ref 0.0–0.5)
Eosinophils Relative: 2 %
HCT: 38 % — ABNORMAL LOW (ref 39.0–52.0)
Hemoglobin: 12.9 g/dL — ABNORMAL LOW (ref 13.0–17.0)
Immature Granulocytes: 0 %
Lymphocytes Relative: 31 %
Lymphs Abs: 2 10*3/uL (ref 0.7–4.0)
MCH: 31.1 pg (ref 26.0–34.0)
MCHC: 33.9 g/dL (ref 30.0–36.0)
MCV: 91.6 fL (ref 80.0–100.0)
Monocytes Absolute: 0.7 10*3/uL (ref 0.1–1.0)
Monocytes Relative: 10 %
Neutro Abs: 3.6 10*3/uL (ref 1.7–7.7)
Neutrophils Relative %: 56 %
Platelet Count: 198 10*3/uL (ref 150–400)
RBC: 4.15 MIL/uL — ABNORMAL LOW (ref 4.22–5.81)
RDW: 12.5 % (ref 11.5–15.5)
WBC Count: 6.4 10*3/uL (ref 4.0–10.5)
nRBC: 0 % (ref 0.0–0.2)

## 2020-05-29 LAB — CMP (CANCER CENTER ONLY)
ALT: 17 U/L (ref 0–44)
AST: 20 U/L (ref 15–41)
Albumin: 3.9 g/dL (ref 3.5–5.0)
Alkaline Phosphatase: 46 U/L (ref 38–126)
Anion gap: 11 (ref 5–15)
BUN: 23 mg/dL (ref 8–23)
CO2: 22 mmol/L (ref 22–32)
Calcium: 9 mg/dL (ref 8.9–10.3)
Chloride: 104 mmol/L (ref 98–111)
Creatinine: 0.89 mg/dL (ref 0.61–1.24)
GFR, Estimated: >60 mL/min (ref >60)
Glucose, Bld: 95 mg/dL (ref 70–99)
Potassium: 3.9 mmol/L (ref 3.5–5.1)
Sodium: 137 mmol/L (ref 135–145)
Total Bilirubin: 0.7 mg/dL (ref 0.3–1.2)
Total Protein: 6.7 g/dL (ref 6.5–8.1)

## 2020-05-29 MED ORDER — HEPARIN SOD (PORK) LOCK FLUSH 100 UNIT/ML IV SOLN
500.0000 [IU] | Freq: Once | INTRAVENOUS | Status: AC
  Administered 2020-05-29: 500 [IU]
  Filled 2020-05-29: qty 5

## 2020-05-29 MED ORDER — SODIUM CHLORIDE 0.9% FLUSH
10.0000 mL | Freq: Once | INTRAVENOUS | Status: AC
  Administered 2020-05-29: 10 mL
  Filled 2020-05-29: qty 10

## 2020-05-30 ENCOUNTER — Inpatient Hospital Stay: Payer: Medicare Other

## 2020-05-30 LAB — PROSTATE-SPECIFIC AG, SERUM (LABCORP): Prostate Specific Ag, Serum: <0.1 ng/mL (ref 0.0–4.0)

## 2020-06-01 ENCOUNTER — Telehealth: Payer: Self-pay | Admitting: Oncology

## 2020-06-01 ENCOUNTER — Other Ambulatory Visit: Payer: Medicare Other

## 2020-06-01 ENCOUNTER — Inpatient Hospital Stay: Payer: Medicare Other

## 2020-06-01 ENCOUNTER — Ambulatory Visit: Payer: Medicare Other | Admitting: Oncology

## 2020-06-01 ENCOUNTER — Other Ambulatory Visit: Payer: Self-pay

## 2020-06-01 ENCOUNTER — Inpatient Hospital Stay: Payer: Medicare Other | Attending: Oncology | Admitting: Oncology

## 2020-06-01 VITALS — BP 159/92 | HR 72 | Temp 97.9°F | Resp 18

## 2020-06-01 DIAGNOSIS — C7951 Secondary malignant neoplasm of bone: Secondary | ICD-10-CM | POA: Diagnosis not present

## 2020-06-01 DIAGNOSIS — Z95828 Presence of other vascular implants and grafts: Secondary | ICD-10-CM

## 2020-06-01 DIAGNOSIS — C61 Malignant neoplasm of prostate: Secondary | ICD-10-CM

## 2020-06-01 DIAGNOSIS — F419 Anxiety disorder, unspecified: Secondary | ICD-10-CM | POA: Diagnosis not present

## 2020-06-01 MED ORDER — DENOSUMAB 120 MG/1.7ML ~~LOC~~ SOLN
120.0000 mg | Freq: Once | SUBCUTANEOUS | Status: AC
  Administered 2020-06-01: 120 mg via SUBCUTANEOUS

## 2020-06-01 MED ORDER — DENOSUMAB 120 MG/1.7ML ~~LOC~~ SOLN
SUBCUTANEOUS | Status: AC
  Filled 2020-06-01: qty 1.7
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
## 2020-06-01 NOTE — Telephone Encounter (Signed)
Scheduled per los. Gave avs and calendar  

## 2020-06-06 DIAGNOSIS — Z23 Encounter for immunization: Secondary | ICD-10-CM | POA: Diagnosis not present

## 2020-06-11 ENCOUNTER — Other Ambulatory Visit: Payer: Self-pay | Admitting: Oncology

## 2020-06-12 ENCOUNTER — Other Ambulatory Visit: Payer: Self-pay | Admitting: Internal Medicine

## 2020-06-12 ENCOUNTER — Encounter: Payer: Self-pay | Admitting: Internal Medicine

## 2020-06-12 DIAGNOSIS — I10 Essential (primary) hypertension: Secondary | ICD-10-CM

## 2020-06-12 MED ORDER — OLMESARTAN MEDOXOMIL 20 MG PO TABS: 20.0000 mg | ORAL_TABLET | Freq: Every day | ORAL | 1 refills | Status: DC

## 2020-06-13 ENCOUNTER — Other Ambulatory Visit: Payer: Self-pay | Admitting: Internal Medicine

## 2020-07-02 ENCOUNTER — Encounter: Payer: Self-pay | Admitting: Oncology

## 2020-07-03 ENCOUNTER — Other Ambulatory Visit: Payer: Self-pay | Admitting: Oncology

## 2020-07-03 MED ORDER — LORAZEPAM 1 MG PO TABS: 1.0000 mg | ORAL_TABLET | Freq: Three times a day (TID) | ORAL | 0 refills | Status: DC | PRN

## 2020-07-23 ENCOUNTER — Other Ambulatory Visit: Payer: Self-pay | Admitting: Internal Medicine

## 2020-07-23 DIAGNOSIS — M153 Secondary multiple arthritis: Secondary | ICD-10-CM

## 2020-07-31 ENCOUNTER — Inpatient Hospital Stay: Payer: Medicare Other | Attending: Oncology

## 2020-07-31 ENCOUNTER — Inpatient Hospital Stay: Payer: Medicare Other

## 2020-07-31 ENCOUNTER — Other Ambulatory Visit: Payer: Self-pay

## 2020-07-31 ENCOUNTER — Other Ambulatory Visit: Payer: Self-pay | Admitting: Internal Medicine

## 2020-07-31 VITALS — BP 137/89 | HR 66 | Temp 97.8°F | Resp 18

## 2020-07-31 DIAGNOSIS — I10 Essential (primary) hypertension: Secondary | ICD-10-CM

## 2020-07-31 DIAGNOSIS — Z95828 Presence of other vascular implants and grafts: Secondary | ICD-10-CM

## 2020-07-31 DIAGNOSIS — C61 Malignant neoplasm of prostate: Secondary | ICD-10-CM | POA: Insufficient documentation

## 2020-07-31 DIAGNOSIS — C7951 Secondary malignant neoplasm of bone: Secondary | ICD-10-CM | POA: Insufficient documentation

## 2020-07-31 LAB — CBC WITH DIFFERENTIAL (CANCER CENTER ONLY)
Abs Immature Granulocytes: 0.02 10*3/uL (ref 0.00–0.07)
Basophils Absolute: 0 10*3/uL (ref 0.0–0.1)
Basophils Relative: 1 %
Eosinophils Absolute: 0.1 10*3/uL (ref 0.0–0.5)
Eosinophils Relative: 2 %
HCT: 36.8 % — ABNORMAL LOW (ref 39.0–52.0)
Hemoglobin: 12.3 g/dL — ABNORMAL LOW (ref 13.0–17.0)
Immature Granulocytes: 0 %
Lymphocytes Relative: 36 %
Lymphs Abs: 2.2 10*3/uL (ref 0.7–4.0)
MCH: 30.9 pg (ref 26.0–34.0)
MCHC: 33.4 g/dL (ref 30.0–36.0)
MCV: 92.5 fL (ref 80.0–100.0)
Monocytes Absolute: 0.6 10*3/uL (ref 0.1–1.0)
Monocytes Relative: 10 %
Neutro Abs: 3.2 10*3/uL (ref 1.7–7.7)
Neutrophils Relative %: 51 %
Platelet Count: 211 10*3/uL (ref 150–400)
RBC: 3.98 MIL/uL — ABNORMAL LOW (ref 4.22–5.81)
RDW: 12.9 % (ref 11.5–15.5)
WBC Count: 6.1 10*3/uL (ref 4.0–10.5)
nRBC: 0 % (ref 0.0–0.2)

## 2020-07-31 LAB — CMP (CANCER CENTER ONLY)
ALT: 14 U/L (ref 0–44)
AST: 20 U/L (ref 15–41)
Albumin: 3.8 g/dL (ref 3.5–5.0)
Alkaline Phosphatase: 47 U/L (ref 38–126)
Anion gap: 9 (ref 5–15)
BUN: 17 mg/dL (ref 8–23)
CO2: 26 mmol/L (ref 22–32)
Calcium: 9.6 mg/dL (ref 8.9–10.3)
Chloride: 102 mmol/L (ref 98–111)
Creatinine: 0.88 mg/dL (ref 0.61–1.24)
GFR, Estimated: >60 mL/min (ref >60)
Glucose, Bld: 93 mg/dL (ref 70–99)
Potassium: 3.9 mmol/L (ref 3.5–5.1)
Sodium: 137 mmol/L (ref 135–145)
Total Bilirubin: 0.6 mg/dL (ref 0.3–1.2)
Total Protein: 6.7 g/dL (ref 6.5–8.1)

## 2020-07-31 MED ORDER — DENOSUMAB 120 MG/1.7ML ~~LOC~~ SOLN
SUBCUTANEOUS | Status: AC
  Filled 2020-07-31: qty 1.7

## 2020-07-31 MED ORDER — SODIUM CHLORIDE 0.9% FLUSH
10.0000 mL | Freq: Once | INTRAVENOUS | Status: AC
Start: 2020-07-31 — End: 2020-07-31
  Administered 2020-07-31: 10 mL
  Filled 2020-07-31: qty 10

## 2020-07-31 MED ORDER — DENOSUMAB 120 MG/1.7ML ~~LOC~~ SOLN
120.0000 mg | Freq: Once | SUBCUTANEOUS | Status: AC
  Administered 2020-07-31: 120 mg via SUBCUTANEOUS

## 2020-07-31 MED ORDER — HEPARIN SOD (PORK) LOCK FLUSH 100 UNIT/ML IV SOLN
500.0000 [IU] | Freq: Once | INTRAVENOUS | Status: AC
  Administered 2020-07-31: 500 [IU]
  Filled 2020-07-31: qty 5

## 2020-07-31 NOTE — Patient Instructions (Signed)
Denosumab injection What is this medicine? DENOSUMAB (den oh sue mab) slows bone breakdown. Prolia is used to treat osteoporosis in women after menopause and in men, and in people who are taking corticosteroids for 6 months or more. Xgeva is used to treat a high calcium level due to cancer and to prevent bone fractures and other bone problems caused by multiple myeloma or cancer bone metastases. Xgeva is also used to treat giant cell tumor of the bone. This medicine may be used for other purposes; ask your health care provider or pharmacist if you have questions. COMMON BRAND NAME(S): Prolia, XGEVA What should I tell my health care provider before I take this medicine? They need to know if you have any of these conditions:  dental disease  having surgery or tooth extraction  infection  kidney disease  low levels of calcium or Vitamin D in the blood  malnutrition  on hemodialysis  skin conditions or sensitivity  thyroid or parathyroid disease  an unusual reaction to denosumab, other medicines, foods, dyes, or preservatives  pregnant or trying to get pregnant  breast-feeding How should I use this medicine? This medicine is for injection under the skin. It is given by a health care professional in a hospital or clinic setting. A special MedGuide will be given to you before each treatment. Be sure to read this information carefully each time. For Prolia, talk to your pediatrician regarding the use of this medicine in children. Special care may be needed. For Xgeva, talk to your pediatrician regarding the use of this medicine in children. While this drug may be prescribed for children as young as 13 years for selected conditions, precautions do apply. Overdosage: If you think you have taken too much of this medicine contact a poison control center or emergency room at once. NOTE: This medicine is only for you. Do not share this medicine with others. What if I miss a dose? It is  important not to miss your dose. Call your doctor or health care professional if you are unable to keep an appointment. What may interact with this medicine? Do not take this medicine with any of the following medications:  other medicines containing denosumab This medicine may also interact with the following medications:  medicines that lower your chance of fighting infection  steroid medicines like prednisone or cortisone This list may not describe all possible interactions. Give your health care provider a list of all the medicines, herbs, non-prescription drugs, or dietary supplements you use. Also tell them if you smoke, drink alcohol, or use illegal drugs. Some items may interact with your medicine. What should I watch for while using this medicine? Visit your doctor or health care professional for regular checks on your progress. Your doctor or health care professional may order blood tests and other tests to see how you are doing. Call your doctor or health care professional for advice if you get a fever, chills or sore throat, or other symptoms of a cold or flu. Do not treat yourself. This drug may decrease your body's ability to fight infection. Try to avoid being around people who are sick. You should make sure you get enough calcium and vitamin D while you are taking this medicine, unless your doctor tells you not to. Discuss the foods you eat and the vitamins you take with your health care professional. See your dentist regularly. Brush and floss your teeth as directed. Before you have any dental work done, tell your dentist you are   receiving this medicine. Do not become pregnant while taking this medicine or for 5 months after stopping it. Talk with your doctor or health care professional about your birth control options while taking this medicine. Women should inform their doctor if they wish to become pregnant or think they might be pregnant. There is a potential for serious side  effects to an unborn child. Talk to your health care professional or pharmacist for more information. What side effects may I notice from receiving this medicine? Side effects that you should report to your doctor or health care professional as soon as possible:  allergic reactions like skin rash, itching or hives, swelling of the face, lips, or tongue  bone pain  breathing problems  dizziness  jaw pain, especially after dental work  redness, blistering, peeling of the skin  signs and symptoms of infection like fever or chills; cough; sore throat; pain or trouble passing urine  signs of low calcium like fast heartbeat, muscle cramps or muscle pain; pain, tingling, numbness in the hands or feet; seizures  unusual bleeding or bruising  unusually weak or tired Side effects that usually do not require medical attention (report to your doctor or health care professional if they continue or are bothersome):  constipation  diarrhea  headache  joint pain  loss of appetite  muscle pain  runny nose  tiredness  upset stomach This list may not describe all possible side effects. Call your doctor for medical advice about side effects. You may report side effects to FDA at 1-800-FDA-1088. Where should I keep my medicine? This medicine is only given in a clinic, doctor's office, or other health care setting and will not be stored at home. NOTE: This sheet is a summary. It may not cover all possible information. If you have questions about this medicine, talk to your doctor, pharmacist, or health care provider.  2021 Elsevier/Gold Standard (2017-06-26 16:10:44)

## 2020-08-01 ENCOUNTER — Other Ambulatory Visit: Payer: Medicare Other

## 2020-08-01 ENCOUNTER — Telehealth: Payer: Self-pay | Admitting: Oncology

## 2020-08-01 ENCOUNTER — Inpatient Hospital Stay: Payer: Medicare Other | Attending: Oncology | Admitting: Oncology

## 2020-08-01 VITALS — BP 149/81 | HR 83 | Temp 97.5°F | Resp 19 | Ht 76.0 in | Wt 195.0 lb

## 2020-08-01 DIAGNOSIS — I10 Essential (primary) hypertension: Secondary | ICD-10-CM | POA: Insufficient documentation

## 2020-08-01 DIAGNOSIS — C61 Malignant neoplasm of prostate: Secondary | ICD-10-CM | POA: Diagnosis not present

## 2020-08-01 DIAGNOSIS — C7951 Secondary malignant neoplasm of bone: Secondary | ICD-10-CM | POA: Diagnosis not present

## 2020-08-01 DIAGNOSIS — Z7982 Long term (current) use of aspirin: Secondary | ICD-10-CM | POA: Diagnosis not present

## 2020-08-01 DIAGNOSIS — Z79899 Other long term (current) drug therapy: Secondary | ICD-10-CM | POA: Diagnosis not present

## 2020-08-01 DIAGNOSIS — E876 Hypokalemia: Secondary | ICD-10-CM | POA: Diagnosis not present

## 2020-08-01 LAB — PROSTATE-SPECIFIC AG, SERUM (LABCORP): Prostate Specific Ag, Serum: <0.1 ng/mL (ref 0.0–4.0)

## 2020-08-01 NOTE — Progress Notes (Signed)
Hematology and Oncology Follow Up Visit  Philip [name] [id number] 05/25/1945 75 y.o. 08/01/2020 3:31 PM Philip [name], MDJones, [name], MD   Principle Diagnosis: 75 year old man with advanced prostate cancer with disease to the bone and lymphadenopathy diagnosed in 2016.  He has castration-resistant after presenting with PSA of 2900.   Prior Therapy: Status post orchiectomy done on November 2016.  Taxotere chemotherapy at 75 mg/m to start on 02/16/2015. He is completed 6 cycles of therapy in March 2017.  Current therapy: Zytiga 1000 mg daily started in July 2017.    Interim History:  Philip [name] presents today for repeat evaluation.  Since last visit, he reports no major changes in his health.  He denies any recent hospitalization or illnesses.  He reports his mood has significantly improved although he is using Ativan twice a day.  He denies any nausea, vomiting or abdominal pain.  He denies any bone pain or pathological fractures.  He denies any new complications related to Zytiga.  Medications: Unchanged on review. Current Outpatient Medications  Medication Sig Dispense Refill  . abiraterone acetate (ZYTIGA) 250 MG tablet TAKE 4 TABLETS BY MOUTH ONCE DAILY AS DIRECTED.  TAKE 1 HOUR BEFORE OR 2 HOURS AFTER A MEAL 120 tablet 10  . aspirin EC 81 MG tablet Take 81 mg by mouth daily.    Marland Kitchen atorvastatin (LIPITOR) 10 MG tablet Take 1 tablet (10 mg total) by mouth daily. 90 tablet 1  . BIOTIN 5000 PO Take 1 tablet by mouth daily.    . Denosumab (XGEVA [place]) Inject into the skin.    . fexofenadine (ALLEGRA) 180 MG tablet Take 180 mg by mouth daily.    . hydrOXYzine (ATARAX/VISTARIL) 10 MG tablet 1 TABLET BY MOUTH EVERY 8 HOURS AS NEEDED    . indapamide (LOZOL) 1.25 MG tablet TAKE 1 TABLET BY MOUTH DAILY. 90 tablet 0  . KLOR-CON M20 20 MEQ tablet TAKE 1 TABLET BY MOUTH TWICE A DAY 180 tablet 1  . lidocaine-prilocaine (EMLA) cream Apply 1 application topically as needed. 30 g 0  . LORazepam  (ATIVAN) 1 MG tablet Take 1 tablet (1 mg total) by mouth every 8 (eight) hours as needed. for anxiety 90 tablet 0  . meloxicam (MOBIC) 7.5 MG tablet TAKE 1 TABLET BY MOUTH EVERY DAY 90 tablet 0  . Multiple Vitamin (MULTIVITAMIN) tablet Take 1 tablet by mouth daily.    Marland Kitchen olmesartan (BENICAR) 20 MG tablet Take 1 tablet (20 mg total) by mouth daily. 90 tablet 1  . traMADol (ULTRAM) 50 MG tablet Take 1 tablet (50 mg total) by mouth every 12 (twelve) hours as needed for moderate pain. 180 tablet 1   No current facility-administered medications for this visit.     Allergies:  Allergies  Allergen Reactions  . Prednisone Anxiety      Physical Exam:   Blood pressure (!) 149/81, pulse 83, temperature (!) 97.5 F (36.4 C), temperature source Tympanic, resp. rate 19, height 6\' 4"  (1.93 m), weight 195 lb (88.5 kg), SpO2 100 %.    ECOG: 0    General appearance: Comfortable appearing without any discomfort Head: Normocephalic without any trauma Oropharynx: Mucous membranes are moist and pink without any thrush or ulcers. Eyes: Pupils are equal and round reactive to light. Lymph nodes: No cervical, supraclavicular, inguinal or axillary lymphadenopathy.   Heart:regular rate and rhythm.  S1 and S2 without leg edema. Lung: Clear without any rhonchi or wheezes.  No dullness to percussion. Abdomin: Soft, nontender,  nondistended with good bowel sounds.  No hepatosplenomegaly. Musculoskeletal: No joint deformity or effusion.  Full range of motion noted. Neurological: No deficits noted on motor, sensory and deep tendon reflex exam. Skin: No petechial rash or dryness.  Appeared moist.  Psychiatric: Mood and affect appeared appropriate.                    Lab Results: Lab Results  Component Value Date   WBC 6.1 07/31/2020   HGB 12.3 (L) 07/31/2020   HCT 36.8 (L) 07/31/2020   MCV 92.5 07/31/2020   PLT 211 07/31/2020     Chemistry      Component Value Date/Time   NA 137  07/31/2020 1236   NA 139 02/10/2017 1323   K 3.9 07/31/2020 1236   K 3.4 (L) 02/10/2017 1323   CL 102 07/31/2020 1236   CO2 26 07/31/2020 1236   CO2 23 02/10/2017 1323   BUN 17 07/31/2020 1236   BUN 13.6 02/10/2017 1323   CREATININE 0.88 07/31/2020 1236   CREATININE 0.9 02/10/2017 1323      Component Value Date/Time   CALCIUM 9.6 07/31/2020 1236   CALCIUM 9.2 02/10/2017 1323   ALKPHOS 47 07/31/2020 1236   ALKPHOS 50 02/10/2017 1323   AST 20 07/31/2020 1236   AST 20 02/10/2017 1323   ALT 14 07/31/2020 1236   ALT 15 02/10/2017 1323   BILITOT 0.6 07/31/2020 1236   BILITOT 0.52 02/10/2017 1323         Results for Philip [name] (MRN [id number]) as of 08/01/2020 15:54  Ref. Range 07/31/2020 12:36  Prostate Specific Ag, Serum Latest Ref Range: 0.0 - 4.0 ng/mL <0.1         Impression and Plan:  75 year old man with:  1.  Advanced prostate cancer with disease to the bone and lymphadenopathy diagnosed in 2016.  He has castration-resistant disease at this time.    He continues to tolerate Zytiga without any major complaints at this time.  His PSA continues to be undetectable with overall clinical benefit.  Risks and benefits of continuing Zytiga were discussed at this time.  Complications that include hypertension, edema and hypokalemia were reiterated.  He is agreeable to proceed at this time.  Alternative treatment options including systemic chemotherapy if he developed relapsed disease.  CT scans will be repeated in 6 months.  2. IV access: Port-A-Cath will continue to be in place and flushed periodically.  3. Hypertension: Still mildly elevated but overall controlled between visits.  Asymptomatic, continue to monitor on Zytiga.  4.  Androgen depravation: He is status post orchiectomy without any additional need for androgen deprivation.  5.  Bone directed therapy: He is currently on Xgeva which will continue to be given every 6 to 8 weeks.  6.  Prognosis: Therapy remains  palliative although aggressive measures are warranted given his excellent performance status.  7.  Anxiety: Improved at this time with low-dose Ativan periodically.  8. Follow-up: He will return in the next 6 to 7 weeks for repeat evaluation.   30 minutes were dedicated to this visit.  The time was spent on reviewing disease status, operatory data review, addressing complications related to his cancer and cancer therapy.  Zola Button, MD 6/1/20223:31 PM

## 2020-08-01 NOTE — Telephone Encounter (Signed)
Scheduled per los. Gave avs and calendar  

## 2020-08-03 ENCOUNTER — Ambulatory Visit: Payer: Medicare Other | Admitting: Oncology

## 2020-08-03 ENCOUNTER — Encounter: Payer: Self-pay | Admitting: Internal Medicine

## 2020-08-03 ENCOUNTER — Ambulatory Visit: Payer: Medicare Other

## 2020-08-06 DIAGNOSIS — C775 Secondary and unspecified malignant neoplasm of intrapelvic lymph nodes: Secondary | ICD-10-CM | POA: Diagnosis not present

## 2020-08-06 DIAGNOSIS — C61 Malignant neoplasm of prostate: Secondary | ICD-10-CM | POA: Diagnosis not present

## 2020-08-06 DIAGNOSIS — C7951 Secondary malignant neoplasm of bone: Secondary | ICD-10-CM | POA: Diagnosis not present

## 2020-08-06 DIAGNOSIS — N1330 Unspecified hydronephrosis: Secondary | ICD-10-CM | POA: Diagnosis not present

## 2020-08-14 ENCOUNTER — Other Ambulatory Visit: Payer: Self-pay | Admitting: Oncology

## 2020-08-14 ENCOUNTER — Encounter: Payer: Self-pay | Admitting: Oncology

## 2020-08-14 ENCOUNTER — Other Ambulatory Visit: Payer: Self-pay | Admitting: Internal Medicine

## 2020-08-14 DIAGNOSIS — I700 Atherosclerosis of aorta: Secondary | ICD-10-CM

## 2020-08-14 DIAGNOSIS — E785 Hyperlipidemia, unspecified: Secondary | ICD-10-CM

## 2020-08-14 MED ORDER — LORAZEPAM 1 MG PO TABS: 1.0000 mg | ORAL_TABLET | Freq: Three times a day (TID) | ORAL | 0 refills | Status: DC | PRN

## 2020-08-15 ENCOUNTER — Telehealth: Payer: Self-pay | Admitting: Internal Medicine

## 2020-08-15 ENCOUNTER — Other Ambulatory Visit: Payer: Self-pay | Admitting: Internal Medicine

## 2020-08-15 DIAGNOSIS — M172 Bilateral post-traumatic osteoarthritis of knee: Secondary | ICD-10-CM

## 2020-08-15 MED ORDER — TRAMADOL HCL 50 MG PO TABS: 50.0000 mg | ORAL_TABLET | Freq: Two times a day (BID) | ORAL | 1 refills | Status: DC | PRN

## 2020-08-15 NOTE — Telephone Encounter (Signed)
  Patient calling to request for traMADol (ULTRAM) 50 MG tablet  Pharmacy [facility], [place] [address]

## 2020-08-30 ENCOUNTER — Telehealth: Payer: Self-pay | Admitting: Internal Medicine

## 2020-08-30 NOTE — Chronic Care Management (AMB) (Signed)
  Chronic Care Management   Outreach Note  08/30/2020 Name: Philip [name] MRN: [id number] DOB: Jul 12, 1945  Referred by: [name], MD Reason for referral : No chief complaint on file.   A second unsuccessful telephone outreach was attempted today. The patient was referred to pharmacist for assistance with care management and care coordination.  Follow Up Plan:   Lauretta Grill Upstream Scheduler

## 2020-09-18 ENCOUNTER — Telehealth: Payer: Self-pay | Admitting: Internal Medicine

## 2020-09-18 NOTE — Progress Notes (Signed)
  Care Management   Follow Up Note   09/18/2020 Name: Philip [name] MRN: [id number] DOB: [birth year]   Referred by: [name], MD Reason for referral : No chief complaint on file.   A second unsuccessful telephone outreach was attempted today. The patient was referred to the case management team for assistance with care management and care coordination.   Follow Up Plan: The care management team will reach out to the patient again over the next 7 days.   Noelle Penner Upstream Scheduler

## 2020-09-19 ENCOUNTER — Inpatient Hospital Stay: Payer: Medicare Other | Attending: Oncology

## 2020-09-19 ENCOUNTER — Other Ambulatory Visit: Payer: Self-pay

## 2020-09-19 DIAGNOSIS — Z9079 Acquired absence of other genital organ(s): Secondary | ICD-10-CM | POA: Diagnosis not present

## 2020-09-19 DIAGNOSIS — Z7982 Long term (current) use of aspirin: Secondary | ICD-10-CM | POA: Insufficient documentation

## 2020-09-19 DIAGNOSIS — C7951 Secondary malignant neoplasm of bone: Secondary | ICD-10-CM | POA: Diagnosis not present

## 2020-09-19 DIAGNOSIS — I10 Essential (primary) hypertension: Secondary | ICD-10-CM | POA: Insufficient documentation

## 2020-09-19 DIAGNOSIS — Z95828 Presence of other vascular implants and grafts: Secondary | ICD-10-CM

## 2020-09-19 DIAGNOSIS — C61 Malignant neoplasm of prostate: Secondary | ICD-10-CM | POA: Diagnosis not present

## 2020-09-19 DIAGNOSIS — Z79899 Other long term (current) drug therapy: Secondary | ICD-10-CM | POA: Diagnosis not present

## 2020-09-19 LAB — CMP (CANCER CENTER ONLY)
ALT: 18 U/L (ref 0–44)
AST: 22 U/L (ref 15–41)
Albumin: 4 g/dL (ref 3.5–5.0)
Alkaline Phosphatase: 43 U/L (ref 38–126)
Anion gap: 6 (ref 5–15)
BUN: 21 mg/dL (ref 8–23)
CO2: 27 mmol/L (ref 22–32)
Calcium: 9.3 mg/dL (ref 8.9–10.3)
Chloride: 103 mmol/L (ref 98–111)
Creatinine: 0.94 mg/dL (ref 0.61–1.24)
GFR, Estimated: >60 mL/min (ref >60)
Glucose, Bld: 109 mg/dL — ABNORMAL HIGH (ref 70–99)
Potassium: 4.1 mmol/L (ref 3.5–5.1)
Sodium: 136 mmol/L (ref 135–145)
Total Bilirubin: 0.6 mg/dL (ref 0.3–1.2)
Total Protein: 6.6 g/dL (ref 6.5–8.1)

## 2020-09-19 LAB — CBC WITH DIFFERENTIAL (CANCER CENTER ONLY)
Abs Immature Granulocytes: 0.01 10*3/uL (ref 0.00–0.07)
Basophils Absolute: 0 10*3/uL (ref 0.0–0.1)
Basophils Relative: 1 %
Eosinophils Absolute: 0.1 10*3/uL (ref 0.0–0.5)
Eosinophils Relative: 1 %
HCT: 36 % — ABNORMAL LOW (ref 39.0–52.0)
Hemoglobin: 12.4 g/dL — ABNORMAL LOW (ref 13.0–17.0)
Immature Granulocytes: 0 %
Lymphocytes Relative: 35 %
Lymphs Abs: 2 10*3/uL (ref 0.7–4.0)
MCH: 31.1 pg (ref 26.0–34.0)
MCHC: 34.4 g/dL (ref 30.0–36.0)
MCV: 90.2 fL (ref 80.0–100.0)
Monocytes Absolute: 0.6 10*3/uL (ref 0.1–1.0)
Monocytes Relative: 11 %
Neutro Abs: 3 10*3/uL (ref 1.7–7.7)
Neutrophils Relative %: 52 %
Platelet Count: 226 10*3/uL (ref 150–400)
RBC: 3.99 MIL/uL — ABNORMAL LOW (ref 4.22–5.81)
RDW: 12.7 % (ref 11.5–15.5)
WBC Count: 5.7 10*3/uL (ref 4.0–10.5)
nRBC: 0 % (ref 0.0–0.2)

## 2020-09-19 MED ORDER — HEPARIN SOD (PORK) LOCK FLUSH 100 UNIT/ML IV SOLN
500.0000 [IU] | Freq: Once | INTRAVENOUS | Status: AC
Start: 2020-09-19 — End: 2020-09-19
  Administered 2020-09-19: 500 [IU]
  Filled 2020-09-19: qty 5

## 2020-09-19 MED ORDER — SODIUM CHLORIDE 0.9% FLUSH
10.0000 mL | Freq: Once | INTRAVENOUS | Status: AC
  Administered 2020-09-19: 10 mL
  Filled 2020-09-19: qty 10

## 2020-09-20 ENCOUNTER — Telehealth: Payer: Self-pay

## 2020-09-20 LAB — PROSTATE-SPECIFIC AG, SERUM (LABCORP): Prostate Specific Ag, Serum: <0.1 ng/mL (ref 0.0–4.0)

## 2020-09-20 NOTE — Telephone Encounter (Signed)
Patient is approved for Zytiga at no cost from [place] 09/19/20-09/19/21  JJPA[facility] uses [place] [facility] Phone [phone] Fax [phone] 09/20/2020 10:26 AM

## 2020-09-20 NOTE — Telephone Encounter (Signed)
Oral Oncology Patient Advocate Encounter  Met patient in lobby to complete application for Wynetta Emery and [place] in an effort to reduce patient's out of pocket expense for Zytiga to $0.    Application completed and faxed to [phone].   JJPAF patient assistance phone number for follow up is [phone].   This encounter will be updated until final determination.  [facility] [place] Phone [phone] Fax [phone] 09/20/2020 10:21 AM

## 2020-09-21 ENCOUNTER — Inpatient Hospital Stay: Payer: Medicare Other

## 2020-09-21 ENCOUNTER — Inpatient Hospital Stay (HOSPITAL_BASED_OUTPATIENT_CLINIC_OR_DEPARTMENT_OTHER): Payer: Medicare Other | Admitting: Oncology

## 2020-09-21 ENCOUNTER — Other Ambulatory Visit: Payer: Self-pay

## 2020-09-21 VITALS — BP 154/98 | HR 76 | Temp 97.7°F | Resp 12 | Ht 76.0 in | Wt 194.2 lb

## 2020-09-21 DIAGNOSIS — Z95828 Presence of other vascular implants and grafts: Secondary | ICD-10-CM

## 2020-09-21 DIAGNOSIS — C61 Malignant neoplasm of prostate: Secondary | ICD-10-CM | POA: Diagnosis not present

## 2020-09-21 DIAGNOSIS — Z9079 Acquired absence of other genital organ(s): Secondary | ICD-10-CM | POA: Diagnosis not present

## 2020-09-21 DIAGNOSIS — I10 Essential (primary) hypertension: Secondary | ICD-10-CM | POA: Diagnosis not present

## 2020-09-21 DIAGNOSIS — Z79899 Other long term (current) drug therapy: Secondary | ICD-10-CM | POA: Diagnosis not present

## 2020-09-21 DIAGNOSIS — C7951 Secondary malignant neoplasm of bone: Secondary | ICD-10-CM | POA: Diagnosis not present

## 2020-09-21 DIAGNOSIS — Z7982 Long term (current) use of aspirin: Secondary | ICD-10-CM | POA: Diagnosis not present

## 2020-09-21 MED ORDER — DENOSUMAB 120 MG/1.7ML ~~LOC~~ SOLN
SUBCUTANEOUS | Status: AC
  Filled 2020-09-21: qty 1.7

## 2020-09-21 MED ORDER — DENOSUMAB 120 MG/1.7ML ~~LOC~~ SOLN
120.0000 mg | Freq: Once | SUBCUTANEOUS | Status: AC
  Administered 2020-09-21: 120 mg via SUBCUTANEOUS

## 2020-09-21 NOTE — Progress Notes (Addendum)
Hematology and Oncology Follow Up Visit  Zymere Perelman [id number] [birth year] 75 y.o. 09/21/2020 2:51 PM [name], MDJones, [name], MD   Principle Diagnosis: 75 year old man with castration-resistant prostate cancer with metastatic disease to the bone and lymphadenopathy after presenting with PSA of 2900 and 2016.   Prior Therapy: Status post orchiectomy done on November 2016.  Taxotere chemotherapy at 75 mg/m to start on 02/16/2015. He is completed 6 cycles of therapy in March 2017.  Current therapy:   Zytiga 1000 mg daily started in July 2017.    Xgeva every 6 weeks.  Next injection will be given today and repeated with the next visit.  Interim History:  Mr. Billips returns today for a follow-up.  Since the last visit, he reports no major changes in his health.  He denies any recent hospitalization or illnesses.  He denies any bone pain or pathological fractures.  He continues to tolerate Zytiga very well without any new complaints.  He denies any worsening edema, fatigue or hypertension.  Medications: Reviewed without changes. Current Outpatient Medications  Medication Sig Dispense Refill   abiraterone acetate (ZYTIGA) 250 MG tablet TAKE 4 TABLETS BY MOUTH ONCE DAILY AS DIRECTED.  TAKE 1 HOUR BEFORE OR 2 HOURS AFTER A MEAL 120 tablet 10   aspirin EC 81 MG tablet Take 81 mg by mouth daily.     atorvastatin (LIPITOR) 10 MG tablet TAKE 1 TABLET BY MOUTH EVERY DAY 90 tablet 1   BIOTIN 5000 PO Take 1 tablet by mouth daily.     Denosumab (XGEVA [place]) Inject into the skin.     fexofenadine (ALLEGRA) 180 MG tablet Take 180 mg by mouth daily.     hydrOXYzine (ATARAX/VISTARIL) 10 MG tablet 1 TABLET BY MOUTH EVERY 8 HOURS AS NEEDED     indapamide (LOZOL) 1.25 MG tablet TAKE 1 TABLET BY MOUTH DAILY. 90 tablet 0   KLOR-CON M20 20 MEQ tablet TAKE 1 TABLET BY MOUTH TWICE A DAY 180 tablet 1   lidocaine-prilocaine (EMLA) cream Apply 1 application topically as needed. 30 g 0   LORazepam (ATIVAN)  1 MG tablet Take 1 tablet (1 mg total) by mouth every 8 (eight) hours as needed. for anxiety 90 tablet 0   meloxicam (MOBIC) 7.5 MG tablet TAKE 1 TABLET BY MOUTH EVERY DAY 90 tablet 0   Multiple Vitamin (MULTIVITAMIN) tablet Take 1 tablet by mouth daily.     olmesartan (BENICAR) 20 MG tablet Take 1 tablet (20 mg total) by mouth daily. 90 tablet 1   traMADol (ULTRAM) 50 MG tablet Take 1 tablet (50 mg total) by mouth every 12 (twelve) hours as needed for moderate pain. 180 tablet 1   No current facility-administered medications for this visit.     Allergies:  Allergies  Allergen Reactions   Prednisone Anxiety      Physical Exam:    Blood pressure (!) 154/98, pulse 76, temperature 97.7 F (36.5 C), temperature source Oral, resp. rate 12, height '6\' 4"'$  (1.93 m), weight 194 lb 3.2 oz (88.1 kg), SpO2 99 %.    ECOG: 0     General appearance: Alert, awake without any distress. Head: Atraumatic without abnormalities Oropharynx: Without any thrush or ulcers. Eyes: No scleral icterus. Lymph nodes: No lymphadenopathy noted in the cervical, supraclavicular, or axillary nodes Heart:regular rate and rhythm, without any murmurs or gallops.   Lung: Clear to auscultation without any rhonchi, wheezes or dullness to percussion. Abdomin: Soft, nontender without any shifting dullness or ascites.  Musculoskeletal: No clubbing or cyanosis. Neurological: No motor or sensory deficits. Skin: No rashes or lesions.                    Lab Results: Lab Results  Component Value Date   WBC 5.7 09/19/2020   HGB 12.4 (L) 09/19/2020   HCT 36.0 (L) 09/19/2020   MCV 90.2 09/19/2020   PLT 226 09/19/2020     Chemistry      Component Value Date/Time   NA 136 09/19/2020 1424   NA 139 02/10/2017 1323   K 4.1 09/19/2020 1424   K 3.4 (L) 02/10/2017 1323   CL 103 09/19/2020 1424   CO2 27 09/19/2020 1424   CO2 23 02/10/2017 1323   BUN 21 09/19/2020 1424   BUN 13.6 02/10/2017 1323    CREATININE 0.94 09/19/2020 1424   CREATININE 0.9 02/10/2017 1323      Component Value Date/Time   CALCIUM 9.3 09/19/2020 1424   CALCIUM 9.2 02/10/2017 1323   ALKPHOS 43 09/19/2020 1424   ALKPHOS 50 02/10/2017 1323   AST 22 09/19/2020 1424   AST 20 02/10/2017 1323   ALT 18 09/19/2020 1424   ALT 15 02/10/2017 1323   BILITOT 0.6 09/19/2020 1424   BILITOT 0.52 02/10/2017 1323         Results for Philip [name], Philip [name] (MRN [id number]) as of 09/21/2020 14:53  Ref. Range 07/31/2020 12:36 09/19/2020 14:24  Prostate Specific Ag, Serum Latest Ref Range: 0.0 - 4.0 ng/mL <0.1 <0.1          Impression and Plan:  75 year old man with:  1.  Castration-resistant advanced prostate cancer with disease to the bone and lymphadenopathy diagnosed in 2016.    His disease status was updated at this time with treatment choices were discussed.  Zytiga continues to be very effective in keeping his PSA undetectable.  Risks and benefits of continuing this treatment were discussed at this time.  Complications that include hypertension, weight gain, and others were reviewed.  Alternative treatment options including chemotherapy, lutetium 117 among others were reviewed.  He is agreeable to continue at this time.  2. IV access: Port-A-Cath remains in place and will be flushed periodically.  3. Hypertension: Mildly elevated but has been under control otherwise.  We will continue to monitor on Zytiga.  4.  Androgen depravation: No additional androgen deprivation is required at this time.  He is status post orchiectomy.   5.  Bone directed therapy: He will receive Xgeva today and repeated in 8 weeks.  Complication occluding osteonecrosis of the jaw and hypocalcemia were discussed.  6.  Prognosis: His disease is incurable although aggressive measures are warranted given his excellent performance status.  7.  Anxiety/depression: Currently on Ativan with stable mood.  8. Follow-up: In 8 weeks for a follow-up  visit.  30 minutes were spent on this visit.  The time was dedicated to reviewing laboratory data, disease status update, treatment options and overall prognosis discussion.  Zola Button, MD 7/22/20222:51 PM

## 2020-09-25 ENCOUNTER — Telehealth: Payer: Self-pay | Admitting: Internal Medicine

## 2020-09-25 NOTE — Chronic Care Management (AMB) (Signed)
  Chronic Care Management   Outreach Note  09/25/2020 Name: Philip [name] MRN: [id number] DOB: 15-Jul-1945  Referred by: [name], MD Reason for referral : No chief complaint on file.   Third unsuccessful telephone outreach was attempted today. The patient was referred to the pharmacist for assistance with care management and care coordination.   Follow Up Plan:   Lauretta Grill Upstream Scheduler

## 2020-10-01 ENCOUNTER — Other Ambulatory Visit (HOSPITAL_COMMUNITY): Payer: Self-pay

## 2020-10-01 ENCOUNTER — Encounter: Payer: Self-pay | Admitting: Oncology

## 2020-10-02 ENCOUNTER — Other Ambulatory Visit: Payer: Self-pay | Admitting: Oncology

## 2020-10-02 ENCOUNTER — Other Ambulatory Visit: Payer: Self-pay | Admitting: Internal Medicine

## 2020-10-02 DIAGNOSIS — E876 Hypokalemia: Secondary | ICD-10-CM

## 2020-10-02 DIAGNOSIS — T502X5A Adverse effect of carbonic-anhydrase inhibitors, benzothiadiazides and other diuretics, initial encounter: Secondary | ICD-10-CM

## 2020-10-02 DIAGNOSIS — I10 Essential (primary) hypertension: Secondary | ICD-10-CM

## 2020-10-02 DIAGNOSIS — C61 Malignant neoplasm of prostate: Secondary | ICD-10-CM

## 2020-10-03 ENCOUNTER — Encounter: Payer: Self-pay | Admitting: Oncology

## 2020-10-03 ENCOUNTER — Other Ambulatory Visit: Payer: Self-pay | Admitting: Oncology

## 2020-10-03 DIAGNOSIS — C61 Malignant neoplasm of prostate: Secondary | ICD-10-CM

## 2020-10-04 ENCOUNTER — Other Ambulatory Visit: Payer: Self-pay | Admitting: Internal Medicine

## 2020-10-04 DIAGNOSIS — C61 Malignant neoplasm of prostate: Secondary | ICD-10-CM

## 2020-10-04 MED ORDER — LORAZEPAM 1 MG PO TABS: 1.0000 mg | ORAL_TABLET | Freq: Three times a day (TID) | ORAL | 0 refills | Status: DC | PRN

## 2020-10-08 ENCOUNTER — Encounter: Payer: Self-pay | Admitting: Oncology

## 2020-10-11 ENCOUNTER — Other Ambulatory Visit: Payer: Self-pay | Admitting: Oncology

## 2020-10-11 DIAGNOSIS — C61 Malignant neoplasm of prostate: Secondary | ICD-10-CM

## 2020-10-11 MED ORDER — LORAZEPAM 1 MG PO TABS: 1.0000 mg | ORAL_TABLET | Freq: Three times a day (TID) | ORAL | 0 refills | Status: DC | PRN

## 2020-10-16 ENCOUNTER — Telehealth: Payer: Self-pay

## 2020-10-18 ENCOUNTER — Other Ambulatory Visit: Payer: Self-pay | Admitting: Internal Medicine

## 2020-10-18 ENCOUNTER — Ambulatory Visit: Payer: Medicare Other

## 2020-10-18 DIAGNOSIS — M153 Secondary multiple arthritis: Secondary | ICD-10-CM

## 2020-10-24 ENCOUNTER — Other Ambulatory Visit: Payer: Self-pay | Admitting: Internal Medicine

## 2020-10-24 DIAGNOSIS — I10 Essential (primary) hypertension: Secondary | ICD-10-CM

## 2020-10-31 ENCOUNTER — Telehealth: Payer: Self-pay | Admitting: Internal Medicine

## 2020-10-31 NOTE — Telephone Encounter (Signed)
LVM for pt to rtn my call to schedule AWV with [facility] on the same day he see his pcp. Please schedule this appt if pt calls the office.

## 2020-11-12 ENCOUNTER — Encounter: Payer: Self-pay | Admitting: Internal Medicine

## 2020-11-12 ENCOUNTER — Ambulatory Visit (INDEPENDENT_AMBULATORY_CARE_PROVIDER_SITE_OTHER): Payer: Medicare Other | Admitting: Internal Medicine

## 2020-11-12 ENCOUNTER — Other Ambulatory Visit: Payer: Self-pay

## 2020-11-12 VITALS — BP 142/76 | HR 72 | Temp 98.1°F | Resp 16 | Ht 76.0 in | Wt 193.0 lb

## 2020-11-12 DIAGNOSIS — Z23 Encounter for immunization: Secondary | ICD-10-CM

## 2020-11-12 DIAGNOSIS — I10 Essential (primary) hypertension: Secondary | ICD-10-CM | POA: Diagnosis not present

## 2020-11-12 DIAGNOSIS — I700 Atherosclerosis of aorta: Secondary | ICD-10-CM

## 2020-11-12 DIAGNOSIS — M172 Bilateral post-traumatic osteoarthritis of knee: Secondary | ICD-10-CM | POA: Diagnosis not present

## 2020-11-12 MED ORDER — TRAMADOL HCL 50 MG PO TABS: 50.0000 mg | ORAL_TABLET | Freq: Two times a day (BID) | ORAL | 1 refills | Status: DC | PRN

## 2020-11-12 NOTE — Patient Instructions (Signed)
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
## 2020-11-19 ENCOUNTER — Ambulatory Visit (INDEPENDENT_AMBULATORY_CARE_PROVIDER_SITE_OTHER): Payer: Medicare Other

## 2020-11-19 ENCOUNTER — Other Ambulatory Visit: Payer: Self-pay

## 2020-11-19 VITALS — BP 140/80 | HR 73 | Temp 98.2°F | Ht 76.0 in | Wt 193.2 lb

## 2020-11-19 DIAGNOSIS — Z0000 Encounter for general adult medical examination without abnormal findings: Secondary | ICD-10-CM | POA: Diagnosis not present

## 2020-11-19 NOTE — Progress Notes (Signed)
Subjective:   Philip [name] is a 75 y.o. male who presents for Medicare Annual/Subsequent preventive examination.  Review of Systems     Cardiac Risk Factors include: advanced age (>50mn, >>60women);dyslipidemia;family history of premature cardiovascular disease;hypertension;male gender     Objective:    Today's Vitals   11/19/20 1027  BP: 140/80  Pulse: 73  Temp: 98.2 F (36.8 C)  SpO2: 98%  Weight: 193 lb 3.2 oz (87.6 kg)  Height: '6\' 4"'$  (1.93 m)  PainSc: 0-No pain   Body mass index is 23.52 kg/m.  Advanced Directives 11/19/2020 12/14/2019 09/22/2019 07/30/2019 07/22/2019 06/02/2019 06/06/2016  Does Patient Have a Medical Advance Directive? Yes Yes No No Yes Yes Yes  Type of Advance Directive Living will Living will;Healthcare Power of [place] Does patient want to make changes to medical advance directive? No - Patient declined No - Patient declined No - Patient declined - No - Patient declined No - Patient declined -  Copy of [place]? - No - copy requested - - - - No - copy requested  Would patient like information on creating a medical advance directive? - No - Patient declined No - Patient declined - - - -    Current Medications (verified) Outpatient Encounter Medications as of 11/19/2020  Medication Sig   abiraterone acetate (ZYTIGA) 250 MG tablet TAKE 4 TABLETS BY MOUTH ONCE DAILY AS DIRECTED.  TAKE 1 HOUR BEFORE OR 2 HOURS AFTER A MEAL   aspirin EC 81 MG tablet Take 81 mg by mouth daily.   atorvastatin (LIPITOR) 10 MG tablet TAKE 1 TABLET BY MOUTH EVERY DAY   BIOTIN 5000 PO Take 1 tablet by mouth daily.   Denosumab (XGEVA [place]) Inject into the skin.   fexofenadine (ALLEGRA) 180 MG tablet Take 180 mg by mouth daily.   hydrOXYzine (ATARAX/VISTARIL) 10 MG tablet 1 TABLET BY MOUTH EVERY 8 HOURS AS NEEDED   indapamide (LOZOL) 1.25 MG tablet TAKE 1 TABLET BY MOUTH EVERY DAY   KLOR-CON  M20 20 MEQ tablet TAKE 1 TABLET BY MOUTH TWICE A DAY   lidocaine-prilocaine (EMLA) cream Apply 1 application topically as needed.   LORazepam (ATIVAN) 1 MG tablet Take 1 tablet (1 mg total) by mouth every 8 (eight) hours as needed. for anxiety   LORazepam (ATIVAN) 1 MG tablet Take 1 tablet (1 mg total) by mouth every 8 (eight) hours as needed. for anxiety   meloxicam (MOBIC) 7.5 MG tablet TAKE 1 TABLET BY MOUTH EVERY DAY   Multiple Vitamin (MULTIVITAMIN) tablet Take 1 tablet by mouth daily.   olmesartan (BENICAR) 20 MG tablet Take 1 tablet (20 mg total) by mouth daily.   traMADol (ULTRAM) 50 MG tablet Take 1 tablet (50 mg total) by mouth every 12 (twelve) hours as needed for moderate pain.   [DISCONTINUED] azelastine (ASTELIN) 0.1 % nasal spray PLACE 1 SPRAY INTO BOTH NOSTRILS 2 (TWO) TIMES DAILY. USE IN EACH NOSTRIL AS DIRECTED   No facility-administered encounter medications on file as of 11/19/2020.    Allergies (verified) Prednisone   History: Past Medical History:  Diagnosis Date   [place] ([place]    PROSTATE   Edema leg Sept 28, 2016   left leg from foot to thigh, increasinly worse over last 4 weeks   Hypertension    Hypoglycemia    [facility]   [facility]G AND FOOT  Past Surgical History:  Procedure Laterality Date   GUM SURGERY     TEETH IMPLANTS ALSO   ORCHIECTOMY Bilateral 01/03/2015   Procedure: ORCHIECTOMY;  Surgeon: Alexis Frock, MD;  Location: [facility];  Service: Urology;  Laterality: Bilateral;   Family History  Problem Relation Age of Onset   CVA Mother    CAD Father    Osteoarthritis Brother    Social History   Socioeconomic History   Marital status: Married    Spouse name: Not on file   Number of children: Not on file   Years of education: Not on file   Highest education level: Not on file  Occupational History   Not on file  Tobacco Use   Smoking status: Never   Smokeless tobacco: Never  Substance and Sexual Activity    Alcohol use: Yes    Alcohol/week: 1.0 standard drink    Types: 1 Glasses of wine per week    Comment: occasional wine with dinner   Drug use: No   Sexual activity: Not on file  Other Topics Concern   Not on file  Social History Narrative   Not on file   Social Determinants of Health   Financial Resource Strain: Low Risk    Difficulty of Paying Living Expenses: Not hard at all  Food Insecurity: No Food Insecurity   Worried About Charity fundraiser in the Last Year: Never true   [place] in the Last Year: Never true  Transportation Needs: No Transportation Needs   Lack of Transportation (Medical): No   Lack of Transportation (Non-Medical): No  Physical Activity: Sufficiently Active   Days of Exercise per Week: 5 days   Minutes of Exercise per Session: 30 min  Stress: No Stress Concern Present   Feeling of Stress : Not at all  Social Connections: Moderately Isolated   Frequency of Communication with Friends and Family: More than three times a week   Frequency of Social Gatherings with Friends and Family: More than three times a week   Attends Religious Services: Never   Marine scientist or Organizations: Patient refused   Attends Archivist Meetings: Never   Marital Status: Married    Tobacco Counseling Counseling given: Not Answered   Clinical Intake:  Pre-visit preparation completed: Yes  Pain : No/denies pain Pain Score: 0-No pain     BMI - recorded: 23.52 Nutritional Status: BMI of 19-24  Normal Nutritional Risks: None Diabetes: No  How often do you need to have someone help you when you read instructions, pamphlets, or other written materials from your doctor or pharmacy?: 1 - Never What is the last grade level you completed in school?: Bachelor's Degree  Diabetic? no  Interpreter Needed?: No  Information entered by :: Lisette Abu, LPN   Activities of Daily Living In your present state of health, do you have any difficulty  performing the following activities: 11/19/2020 02/08/2020  Hearing? N N  Vision? N N  Difficulty concentrating or making decisions? N N  Walking or climbing stairs? N N  Dressing or bathing? N N  Doing errands, shopping? N N  Preparing Food and eating ? N -  Using the Toilet? N -  In the past six months, have you accidently leaked urine? N -  Do you have problems with loss of bowel control? N -  Managing your Medications? N -  Managing your Finances? N -  Housekeeping or managing your Housekeeping? N -  Some recent data might be hidden    Patient Care Team: [name], MD as PCP - General (Internal Medicine) Amedeo Kinsman, OD as Consulting Physician (Optometry)  Indicate any recent Medical Services you may have received from other than [facility] providers in the past year (date may be approximate).     Assessment:   This is a routine wellness examination for [place].  Hearing/Vision screen Hearing Screening - Comments:: Patient denied any hearing difficulty. Vision Screening - Comments:: Patient wears contacts.  Eye exam done once a year by [facility].  Dietary issues and exercise activities discussed: Current Exercise Habits: Home exercise routine, Type of exercise: walking;strength training/weights;stretching;Other - see comments (stationary bike), Time (Minutes): 30, Frequency (Times/Week): 5, Weekly Exercise (Minutes/Week): 150, Intensity: Moderate, Exercise limited by: None identified   Goals Addressed               This Visit's Progress     Patient Stated (pt-stated)        My goal is to stay healthy by keeping my blood pressure and cholesterol down.      Depression Screen PHQ 2/9 Scores 11/19/2020 12/27/2019 03/30/2018 03/29/2018  PHQ - 2 Score 0 0 0 0    Fall Risk Fall Risk  11/19/2020 05/09/2019 03/30/2018 03/29/2018 01/20/2018  Falls in the past year? 0 0 0 0 0  Comment - - - - Emmi Telephone Survey: data to providers prior to load  Number falls in  past yr: 0 0 - 0 -  Injury with Fall? 0 0 - 0 -  Risk for fall due to : No Fall Risks - - Other (Comment) -  Follow up Falls evaluation completed - - Falls evaluation completed -    FALL RISK PREVENTION PERTAINING TO THE HOME:  Any stairs in or around the home? Yes  If so, are there any without handrails? No  Home free of loose throw rugs in walkways, pet beds, electrical cords, etc? Yes  Adequate lighting in your home to reduce risk of falls? Yes   ASSISTIVE DEVICES UTILIZED TO PREVENT FALLS:  Life alert? No  Use of a cane, walker or w/c? No  Grab bars in the bathroom? Yes  Shower chair or bench in shower? Yes  Elevated toilet seat or a handicapped toilet? Yes   TIMED UP AND GO:  Was the test performed? Yes .  Length of time to ambulate 10 feet: 5 sec.   Gait steady and fast without use of assistive device  Cognitive Function: Normal cognitive status assessed by direct observation by this Nurse Health Advisor. No abnormalities found.          Immunizations Immunization History  Administered Date(s) Administered   Fluad Quad(high Dose 65+) 11/24/2018, 11/14/2019, 11/12/2020   Influenza, High Dose Seasonal PF 12/03/2015, 12/01/2016, 11/22/2017   Influenza-Unspecified 01/19/2015, 12/03/2015, 11/22/2017   PFIZER(Purple Top)SARS-COV-2 Vaccination 04/10/2019, 05/04/2019, 11/30/2019, 06/06/2020, 11/12/2020   Pneumococcal Conjugate-13 03/11/2017   Pneumococcal Polysaccharide-23 11/14/2019   Pneumococcal-Unspecified 07/20/2012   Tdap 07/20/2012    TDAP status: Up to date  Flu Vaccine status: Up to date  Pneumococcal vaccine status: Up to date  Covid-19 vaccine status: Completed vaccines  Qualifies for Shingles Vaccine? Yes   Zostavax completed No   Shingrix Completed?: No.    Education has been provided regarding the importance of this vaccine. Patient has been advised to call insurance company to determine out of pocket expense if they have not yet received this  vaccine. Advised may  also receive vaccine at local pharmacy or Health Dept. Verbalized acceptance and understanding.  Screening Tests Health Maintenance  Topic Date Due   Zoster Vaccines- Shingrix (1 of 2) Never done   TETANUS/TDAP  07/21/2022   INFLUENZA VACCINE  Completed   COVID-19 Vaccine  Completed   Hepatitis C Screening  Completed   HPV VACCINES  Aged Out    Health Maintenance  Health Maintenance Due  Topic Date Due   Zoster Vaccines- Shingrix (1 of 2) Never done    Colorectal cancer screening: Type of screening: Cologuard. Completed 03/17/2017. Repeat every 3 years  Lung Cancer Screening: (Low Dose CT Chest recommended if Age 1-80 years, 30 pack-year currently smoking OR have quit w/in 15years.) does not qualify.   Lung Cancer Screening Referral: no  Additional Screening:  Hepatitis C Screening: does qualify; Completed yes  Vision Screening: Recommended annual ophthalmology exams for early detection of glaucoma and other disorders of the eye. Is the patient up to date with their annual eye exam?  Yes  Who is the provider or what is the name of the office in which the patient attends annual eye exams? Amedeo Kinsman, Jr. O.D. If pt is not established with a provider, would they like to be referred to a provider to establish care? No .   Dental Screening: Recommended annual dental exams for proper oral hygiene  Community Resource Referral / Chronic Care Management: CRR required this visit?  No   CCM required this visit?  No      Plan:     I have personally reviewed and noted the following in the patient's chart:   Medical and social history Use of alcohol, tobacco or illicit drugs  Current medications and supplements including opioid prescriptions. Patient is not currently taking opioid prescriptions. Functional ability and status Nutritional status Physical activity Advanced directives List of other physicians Hospitalizations, surgeries, and ER visits  in previous 12 months Vitals Screenings to include cognitive, depression, and falls Referrals and appointments  In addition, I have reviewed and discussed with patient certain preventive protocols, quality metrics, and best practice recommendations. A written personalized care plan for preventive services as well as general preventive health recommendations were provided to patient.     Sheral Flow, LPN   [id number]   Nurse Notes:  Hearing Screening - Comments:: Patient denied any hearing difficulty. Vision Screening - Comments:: Patient wears contacts.  Eye exam done once a year by Amedeo Kinsman, Jr. O.D. with [facility].

## 2020-11-19 NOTE — Patient Instructions (Signed)
[name] , Thank you for taking time to come for your Medicare Wellness Visit. I appreciate your ongoing commitment to your health goals. Please review the following plan we discussed and let me know if I can assist you in the future.   Screening recommendations/referrals: Colonoscopy: 03/17/2017; Cologuard due every 3 years Recommended yearly ophthalmology/optometry visit for glaucoma screening and checkup Recommended yearly dental visit for hygiene and checkup  Vaccinations: Influenza vaccine: 11/12/2020 Pneumococcal vaccine: 03/11/2017, 11/14/2019 Tdap vaccine: 07/20/2012; due every 10 years Shingles vaccine: never done   Covid-19:04/10/2019, 05/04/2019, 11/30/2019, 06/06/2020, 11/12/2020  Advanced directives: Please bring a copy of your health care power of attorney and living will to the office at your convenience.  Conditions/risks identified: Yes; Client understands the importance of follow-up with providers by attending scheduled visits and discussed goals to eat healthier, increase physical activity, exercise the brain, socialize more, get enough sleep and make time for laughter.  Next appointment: Please schedule your next Medicare Wellness Visit with your Nurse Health Advisor in 1 year by calling [phone].  Preventive Care 76 Years and Older, Male Preventive care refers to lifestyle choices and visits with your health care provider that can promote health and wellness. What does preventive care include? A yearly physical exam. This is also called an annual well check. Dental exams once or twice a year. Routine eye exams. Ask your health care provider how often you should have your eyes checked. Personal lifestyle choices, including: Daily care of your teeth and gums. Regular physical activity. Eating a healthy diet. Avoiding tobacco and drug use. Limiting alcohol use. Practicing safe sex. Taking low doses of aspirin every day. Taking vitamin and mineral supplements as recommended  by your health care provider. What happens during an annual well check? The services and screenings done by your health care provider during your annual well check will depend on your age, overall health, lifestyle risk factors, and family history of disease. Counseling  Your health care provider may ask you questions about your: Alcohol use. Tobacco use. Drug use. Emotional well-being. Home and relationship well-being. Sexual activity. Eating habits. History of falls. Memory and ability to understand (cognition). Work and work Statistician. Screening  You may have the following tests or measurements: Height, weight, and BMI. Blood pressure. Lipid and cholesterol levels. These may be checked every 5 years, or more frequently if you are over 76 years old. Skin check. Lung cancer screening. You may have this screening every year starting at age 76 if you have a 30-pack-year history of smoking and currently smoke or have quit within the past 15 years. Fecal occult blood test (FOBT) of the stool. You may have this test every year starting at age 76. Flexible sigmoidoscopy or colonoscopy. You may have a sigmoidoscopy every 5 years or a colonoscopy every 10 years starting at age 76. Prostate cancer screening. Recommendations will vary depending on your family history and other risks. Hepatitis C blood test. Hepatitis B blood test. Sexually transmitted disease (STD) testing. Diabetes screening. This is done by checking your blood sugar (glucose) after you have not eaten for a while (fasting). You may have this done every 1-3 years. Abdominal aortic aneurysm (AAA) screening. You may need this if you are a current or former smoker. Osteoporosis. You may be screened starting at age 76 if you are at high risk. Talk with your health care provider about your test results, treatment options, and if necessary, the need for more tests. Vaccines  Your health care provider  may recommend certain  vaccines, such as: Influenza vaccine. This is recommended every year. Tetanus, diphtheria, and acellular pertussis (Tdap, Td) vaccine. You may need a Td booster every 10 years. Zoster vaccine. You may need this after age 76. Pneumococcal 13-valent conjugate (PCV13) vaccine. One dose is recommended after age 76. Pneumococcal polysaccharide (PPSV23) vaccine. One dose is recommended after age 76. Talk to your health care provider about which screenings and vaccines you need and how often you need them. This information is not intended to replace advice given to you by your health care provider. Make sure you discuss any questions you have with your health care provider. Document Released: 03/16/2015 Document Revised: 11/07/2015 Document Reviewed: 12/19/2014 Elsevier Interactive Patient Education  2017 [place] Prevention in the Home Falls can cause injuries. They can happen to people of all ages. There are many things you can do to make your home safe and to help prevent falls. What can I do on the outside of my home? Regularly fix the edges of walkways and driveways and fix any cracks. Remove anything that might make you trip as you walk through a door, such as a raised step or threshold. Trim any bushes or trees on the path to your home. Use bright outdoor lighting. Clear any walking paths of anything that might make someone trip, such as rocks or tools. Regularly check to see if handrails are loose or broken. Make sure that both sides of any steps have handrails. Any raised decks and porches should have guardrails on the edges. Have any leaves, snow, or ice cleared regularly. Use sand or salt on walking paths during winter. Clean up any spills in your garage right away. This includes oil or grease spills. What can I do in the bathroom? Use night lights. Install grab bars by the toilet and in the tub and shower. Do not use towel bars as grab bars. Use non-skid mats or decals in  the tub or shower. If you need to sit down in the shower, use a plastic, non-slip stool. Keep the floor dry. Clean up any water that spills on the floor as soon as it happens. Remove soap buildup in the tub or shower regularly. Attach bath mats securely with double-sided non-slip rug tape. Do not have throw rugs and other things on the floor that can make you trip. What can I do in the bedroom? Use night lights. Make sure that you have a light by your bed that is easy to reach. Do not use any sheets or blankets that are too big for your bed. They should not hang down onto the floor. Have a firm chair that has side arms. You can use this for support while you get dressed. Do not have throw rugs and other things on the floor that can make you trip. What can I do in the kitchen? Clean up any spills right away. Avoid walking on wet floors. Keep items that you use a lot in easy-to-reach places. If you need to reach something above you, use a strong step stool that has a grab bar. Keep electrical cords out of the way. Do not use floor polish or wax that makes floors slippery. If you must use wax, use non-skid floor wax. Do not have throw rugs and other things on the floor that can make you trip. What can I do with my stairs? Do not leave any items on the stairs. Make sure that there are handrails on both sides  of the stairs and use them. Fix handrails that are broken or loose. Make sure that handrails are as long as the stairways. Check any carpeting to make sure that it is firmly attached to the stairs. Fix any carpet that is loose or worn. Avoid having throw rugs at the top or bottom of the stairs. If you do have throw rugs, attach them to the floor with carpet tape. Make sure that you have a light switch at the top of the stairs and the bottom of the stairs. If you do not have them, ask someone to add them for you. What else can I do to help prevent falls? Wear shoes that: Do not have high  heels. Have rubber bottoms. Are comfortable and fit you well. Are closed at the toe. Do not wear sandals. If you use a stepladder: Make sure that it is fully opened. Do not climb a closed stepladder. Make sure that both sides of the stepladder are locked into place. Ask someone to hold it for you, if possible. Clearly mark and make sure that you can see: Any grab bars or handrails. First and last steps. Where the edge of each step is. Use tools that help you move around (mobility aids) if they are needed. These include: Canes. Walkers. Scooters. Crutches. Turn on the lights when you go into a dark area. Replace any light bulbs as soon as they burn out. Set up your furniture so you have a clear path. Avoid moving your furniture around. If any of your floors are uneven, fix them. If there are any pets around you, be aware of where they are. Review your medicines with your doctor. Some medicines can make you feel dizzy. This can increase your chance of falling. Ask your doctor what other things that you can do to help prevent falls. This information is not intended to replace advice given to you by your health care provider. Make sure you discuss any questions you have with your health care provider. Document Released: 12/14/2008 Document Revised: 07/26/2015 Document Reviewed: 03/24/2014 Elsevier Interactive Patient Education  2017 [employer].

## 2020-11-21 ENCOUNTER — Inpatient Hospital Stay: Payer: Medicare Other | Attending: Oncology

## 2020-11-21 ENCOUNTER — Other Ambulatory Visit: Payer: Self-pay

## 2020-11-21 DIAGNOSIS — C7951 Secondary malignant neoplasm of bone: Secondary | ICD-10-CM | POA: Diagnosis not present

## 2020-11-21 DIAGNOSIS — Z95828 Presence of other vascular implants and grafts: Secondary | ICD-10-CM

## 2020-11-21 DIAGNOSIS — C61 Malignant neoplasm of prostate: Secondary | ICD-10-CM | POA: Insufficient documentation

## 2020-11-21 LAB — CMP (CANCER CENTER ONLY)
ALT: 16 U/L (ref 0–44)
AST: 21 U/L (ref 15–41)
Albumin: 3.7 g/dL (ref 3.5–5.0)
Alkaline Phosphatase: 48 U/L (ref 38–126)
Anion gap: 10 (ref 5–15)
BUN: 15 mg/dL (ref 8–23)
CO2: 25 mmol/L (ref 22–32)
Calcium: 9.3 mg/dL (ref 8.9–10.3)
Chloride: 102 mmol/L (ref 98–111)
Creatinine: 1.03 mg/dL (ref 0.61–1.24)
GFR, Estimated: >60 mL/min (ref >60)
Glucose, Bld: 101 mg/dL — ABNORMAL HIGH (ref 70–99)
Potassium: 3.6 mmol/L (ref 3.5–5.1)
Sodium: 137 mmol/L (ref 135–145)
Total Bilirubin: 0.5 mg/dL (ref 0.3–1.2)
Total Protein: 6.4 g/dL — ABNORMAL LOW (ref 6.5–8.1)

## 2020-11-21 LAB — CBC WITH DIFFERENTIAL (CANCER CENTER ONLY)
Abs Immature Granulocytes: 0.01 10*3/uL (ref 0.00–0.07)
Basophils Absolute: 0 10*3/uL (ref 0.0–0.1)
Basophils Relative: 1 %
Eosinophils Absolute: 0.1 10*3/uL (ref 0.0–0.5)
Eosinophils Relative: 2 %
HCT: 36.4 % — ABNORMAL LOW (ref 39.0–52.0)
Hemoglobin: 12.3 g/dL — ABNORMAL LOW (ref 13.0–17.0)
Immature Granulocytes: 0 %
Lymphocytes Relative: 35 %
Lymphs Abs: 2.1 10*3/uL (ref 0.7–4.0)
MCH: 30.8 pg (ref 26.0–34.0)
MCHC: 33.8 g/dL (ref 30.0–36.0)
MCV: 91.2 fL (ref 80.0–100.0)
Monocytes Absolute: 0.6 10*3/uL (ref 0.1–1.0)
Monocytes Relative: 10 %
Neutro Abs: 3.3 10*3/uL (ref 1.7–7.7)
Neutrophils Relative %: 52 %
Platelet Count: 213 10*3/uL (ref 150–400)
RBC: 3.99 MIL/uL — ABNORMAL LOW (ref 4.22–5.81)
RDW: 12.9 % (ref 11.5–15.5)
WBC Count: 6.1 10*3/uL (ref 4.0–10.5)
nRBC: 0 % (ref 0.0–0.2)

## 2020-11-21 MED ORDER — HEPARIN SOD (PORK) LOCK FLUSH 100 UNIT/ML IV SOLN
500.0000 [IU] | Freq: Once | INTRAVENOUS | Status: AC
  Administered 2020-11-21: 500 [IU]

## 2020-11-21 MED ORDER — SODIUM CHLORIDE 0.9% FLUSH
10.0000 mL | Freq: Once | INTRAVENOUS | Status: AC
  Administered 2020-11-21: 10 mL

## 2020-11-22 LAB — PROSTATE-SPECIFIC AG, SERUM (LABCORP): Prostate Specific Ag, Serum: <0.1 ng/mL (ref 0.0–4.0)

## 2020-11-23 ENCOUNTER — Other Ambulatory Visit: Payer: Self-pay

## 2020-11-23 ENCOUNTER — Inpatient Hospital Stay (HOSPITAL_BASED_OUTPATIENT_CLINIC_OR_DEPARTMENT_OTHER): Payer: Medicare Other | Admitting: Oncology

## 2020-11-23 ENCOUNTER — Other Ambulatory Visit: Payer: Self-pay | Admitting: Oncology

## 2020-11-23 ENCOUNTER — Inpatient Hospital Stay: Payer: Medicare Other

## 2020-11-23 VITALS — BP 162/93 | HR 80 | Temp 97.2°F | Resp 17 | Ht 76.0 in | Wt 195.3 lb

## 2020-11-23 DIAGNOSIS — C61 Malignant neoplasm of prostate: Secondary | ICD-10-CM

## 2020-11-23 DIAGNOSIS — Z95828 Presence of other vascular implants and grafts: Secondary | ICD-10-CM

## 2020-11-23 DIAGNOSIS — C7951 Secondary malignant neoplasm of bone: Secondary | ICD-10-CM | POA: Diagnosis not present

## 2020-11-23 MED ORDER — DENOSUMAB 120 MG/1.7ML ~~LOC~~ SOLN
120.0000 mg | Freq: Once | SUBCUTANEOUS | Status: AC
  Administered 2020-11-23: 120 mg via SUBCUTANEOUS
  Filled 2020-11-23: qty 1.7

## 2020-11-23 NOTE — Progress Notes (Signed)
Hematology and Oncology Follow Up Visit  Philip [name] [id number] [birth year] 75 y.o. 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  Prior Therapy: Status post orchiectomy done on November 2016.  Taxotere chemotherapy at 75 mg/m to start on 02/16/2015. He is completed 6 cycles of therapy in March 2017.  Current therapy: Zytiga 1000 mg daily started in July 2017.    Interim History:  [name] today for repeat evaluation.  Since the last visit, he reports feeling well without any major complaints at this time.  He denies any nausea, vomiting or abdominal pain.  He denies any bone pain or pathological fractures.  He denies any increased anxiety or depression.  He denies any dizziness or lightheadedness.  Medications: Reviewed without changes. Current Outpatient Medications  Medication Sig Dispense Refill   abiraterone acetate (ZYTIGA) 250 MG tablet TAKE 4 TABLETS BY MOUTH ONCE DAILY AS DIRECTED.  TAKE 1 HOUR BEFORE OR 2 HOURS AFTER A MEAL 120 tablet 10   aspirin EC 81 MG tablet Take 81 mg by mouth daily.     atorvastatin (LIPITOR) 10 MG tablet TAKE 1 TABLET BY MOUTH EVERY DAY 90 tablet 1   BIOTIN 5000 PO Take 1 tablet by mouth daily.     Denosumab (XGEVA [place]) Inject into the skin.     fexofenadine (ALLEGRA) 180 MG tablet Take 180 mg by mouth daily.     hydrOXYzine (ATARAX/VISTARIL) 10 MG tablet 1 TABLET BY MOUTH EVERY 8 HOURS AS NEEDED     indapamide (LOZOL) 1.25 MG tablet TAKE 1 TABLET BY MOUTH EVERY DAY 90 tablet 0   KLOR-CON M20 20 MEQ tablet TAKE 1 TABLET BY MOUTH TWICE A DAY 180 tablet 0   lidocaine-prilocaine (EMLA) cream Apply 1 application topically as needed. 30 g 0   LORazepam (ATIVAN) 1 MG tablet Take 1 tablet (1 mg total) by mouth every 8 (eight) hours as needed. for anxiety 30 tablet 0    LORazepam (ATIVAN) 1 MG tablet TAKE 1 TABLET BY MOUTH EVERY 8 HOURS AS NEEDED FOR ANXIETY 90 tablet 0   meloxicam (MOBIC) 7.5 MG tablet TAKE 1 TABLET BY MOUTH EVERY DAY 90 tablet 0   Multiple Vitamin (MULTIVITAMIN) tablet Take 1 tablet by mouth daily.     olmesartan (BENICAR) 20 MG tablet Take 1 tablet (20 mg total) by mouth daily. 90 tablet 1   traMADol (ULTRAM) 50 MG tablet Take 1 tablet (50 mg total) by mouth every 12 (twelve) hours as needed for moderate pain. 180 tablet 1   No current facility-administered medications for this visit.     Allergies:  Allergies  Allergen Reactions   Prednisone Anxiety      Physical Exam:   Blood pressure (!) 162/93, pulse 80, temperature (!) 97.2 F (36.2 C), temperature source Tympanic, resp. rate 17, height 6\' 4"  (1.93 m), weight 195 lb 4.8 oz (88.6 kg), SpO2 100 %.     ECOG: 0    General appearance: Comfortable appearing without any discomfort Head: Normocephalic without any trauma Oropharynx: Mucous membranes are moist and pink without any thrush or ulcers. Eyes: Pupils are equal and round reactive to light. Lymph nodes: No cervical, supraclavicular, inguinal or axillary lymphadenopathy.   Heart:regular rate and rhythm.  S1 and S2 without leg edema. Lung: Clear without any rhonchi or wheezes.  No dullness  to percussion. Abdomin: Soft, nontender, nondistended with good bowel sounds.  No hepatosplenomegaly. Musculoskeletal: No joint deformity or effusion.  Full range of motion noted. Neurological: No deficits noted on motor, sensory and deep tendon reflex exam. Skin: No petechial rash or dryness.  Appeared moist.                      Lab Results: Lab Results  Component Value Date   WBC 6.1 11/21/2020   HGB 12.3 (L) 11/21/2020   HCT 36.4 (L) 11/21/2020   MCV 91.2 11/21/2020   PLT 213 11/21/2020     Chemistry      Component Value Date/Time   NA 137 11/21/2020 1401   NA 139 02/10/2017 1323   K 3.6  11/21/2020 1401   K 3.4 (L) 02/10/2017 1323   CL 102 11/21/2020 1401   CO2 25 11/21/2020 1401   CO2 23 02/10/2017 1323   BUN 15 11/21/2020 1401   BUN 13.6 02/10/2017 1323   CREATININE 1.03 11/21/2020 1401   CREATININE 0.9 02/10/2017 1323      Component Value Date/Time   CALCIUM 9.3 11/21/2020 1401   CALCIUM 9.2 02/10/2017 1323   ALKPHOS 48 11/21/2020 1401   ALKPHOS 50 02/10/2017 1323   AST 21 11/21/2020 1401   AST 20 02/10/2017 1323   ALT 16 11/21/2020 1401   ALT 15 02/10/2017 1323   BILITOT 0.5 11/21/2020 1401   BILITOT 0.52 02/10/2017 1323         Results for Philip, [name] (MRN [id number]) as of 11/23/2020 14:44  Ref. Range 09/19/2020 14:24 11/21/2020 14:01  Prostate Specific Ag, Serum Latest Ref Range: 0.0 - 4.0 ng/mL <0.1 <0.1          Impression and Plan:  75 year old man with:  1.  Castration-resistant advanced prostate cancer with disease to the bone and lymphadenopathy diagnosed in 2016.     He is currently on Zytiga with excellent PSA response at this time and no major complications.  Risks and benefits of continuing this treatment were reviewed.  Complications including hypertension, fatigue and edema were reiterated.  Different salvage therapy options including lutetium and chemotherapy were also reiterated.  He is agreeable to continue.  The plan is to update his staging scans in December 2022.  2. IV access: Port-A-Cath remains in place without any issues and will be flushed periodically.  3. Hypertension: His blood pressure is elevated today but close to normal range between visits.  4.  Androgen depravation: No additional androgen deprivation is needed at this time.  He is status post orchiectomy.  5.  Bone directed therapy: He will receive Xgeva today and repeated in 6 weeks.  Complications including osteonecrosis of the jaw and hypocalcemia were reviewed.  6.  Prognosis: His disease is incurable although aggressive measures are warranted given his  reasonable performance status.  7.  Anxiety: Continues to be on low-dose Ativan and manageable.  8. Follow-up: In the next 6 to 8 weeks for repeat follow-up.  30 minutes were spent on this encounter.  Time was dedicated to reviewing laboratory data, disease status update, treatment choices and future plan of care review.  Zola Button, MD 9/23/20222:43 PM

## 2020-11-26 ENCOUNTER — Encounter: Payer: Self-pay | Admitting: Oncology

## 2020-12-06 DIAGNOSIS — L814 Other melanin hyperpigmentation: Secondary | ICD-10-CM | POA: Diagnosis not present

## 2020-12-06 DIAGNOSIS — L298 Other pruritus: Secondary | ICD-10-CM | POA: Diagnosis not present

## 2020-12-06 DIAGNOSIS — D1801 Hemangioma of skin and subcutaneous tissue: Secondary | ICD-10-CM | POA: Diagnosis not present

## 2020-12-06 DIAGNOSIS — L821 Other seborrheic keratosis: Secondary | ICD-10-CM | POA: Diagnosis not present

## 2020-12-19 ENCOUNTER — Encounter: Payer: Self-pay | Admitting: Oncology

## 2020-12-19 ENCOUNTER — Telehealth: Payer: Self-pay

## 2020-12-19 ENCOUNTER — Other Ambulatory Visit (HOSPITAL_COMMUNITY): Payer: Self-pay

## 2020-12-19 NOTE — Telephone Encounter (Signed)
Oral Oncology Patient Advocate Encounter  Was successful in securing patient a $8000 grant from [employer] to provide copayment coverage for Zytiga.  This will keep the out of pocket expense at $0.     [employer] ID: [id number]  I have spoken with the patient.   The billing information is as follows and has been shared with [place]: [id number] PCN: [id number] Member ID: [id number] Group ID: [id number] Dates of Eligibility: 11/19/20 through 11/18/21  Fund:  [place] [facility] [place] Phone [phone] Fax [phone] 12/19/2020 2:22 PM

## 2020-12-31 ENCOUNTER — Other Ambulatory Visit: Payer: Self-pay | Admitting: Internal Medicine

## 2020-12-31 DIAGNOSIS — H02834 Dermatochalasis of left upper eyelid: Secondary | ICD-10-CM | POA: Diagnosis not present

## 2020-12-31 DIAGNOSIS — E876 Hypokalemia: Secondary | ICD-10-CM

## 2020-12-31 DIAGNOSIS — H25013 Cortical age-related cataract, bilateral: Secondary | ICD-10-CM | POA: Diagnosis not present

## 2020-12-31 DIAGNOSIS — I10 Essential (primary) hypertension: Secondary | ICD-10-CM

## 2020-12-31 DIAGNOSIS — H02831 Dermatochalasis of right upper eyelid: Secondary | ICD-10-CM | POA: Diagnosis not present

## 2020-12-31 DIAGNOSIS — H2513 Age-related nuclear cataract, bilateral: Secondary | ICD-10-CM | POA: Diagnosis not present

## 2020-12-31 DIAGNOSIS — T502X5A Adverse effect of carbonic-anhydrase inhibitors, benzothiadiazides and other diuretics, initial encounter: Secondary | ICD-10-CM

## 2021-01-02 ENCOUNTER — Other Ambulatory Visit (HOSPITAL_COMMUNITY): Payer: Self-pay

## 2021-01-04 ENCOUNTER — Telehealth: Payer: Self-pay

## 2021-01-04 ENCOUNTER — Other Ambulatory Visit (HOSPITAL_COMMUNITY): Payer: Self-pay

## 2021-01-04 ENCOUNTER — Encounter: Payer: Self-pay | Admitting: Oncology

## 2021-01-04 NOTE — Telephone Encounter (Signed)
Oral Oncology Patient Advocate Encounter   Was successful in securing patient an [phone] grant from [facility] [place] [facility]) to provide copayment coverage for Zytiga.  This will keep the out of pocket expense at $0.     I have spoken with the patient.    The billing information is as follows and has been shared with [place].   Member ID: [id number] Group ID: [id number] RxBin: [id number] Dates of Eligibility: 10/06/20 through 01/03/22  Fund:  [place] [facility] [place] Phone [phone] Fax [phone] 01/04/2021 2:36 PM

## 2021-01-08 ENCOUNTER — Other Ambulatory Visit: Payer: Self-pay | Admitting: Oncology

## 2021-01-08 DIAGNOSIS — C61 Malignant neoplasm of prostate: Secondary | ICD-10-CM

## 2021-01-13 ENCOUNTER — Other Ambulatory Visit: Payer: Self-pay | Admitting: Internal Medicine

## 2021-01-13 DIAGNOSIS — M153 Secondary multiple arthritis: Secondary | ICD-10-CM

## 2021-01-17 ENCOUNTER — Telehealth: Payer: Self-pay | Admitting: *Deleted

## 2021-01-17 ENCOUNTER — Other Ambulatory Visit: Payer: Self-pay | Admitting: *Deleted

## 2021-01-17 DIAGNOSIS — R600 Localized edema: Secondary | ICD-10-CM

## 2021-01-17 NOTE — Telephone Encounter (Signed)
PC to patient - he called earlier C/O severe pain in his L leg & L knee, his knee is swollen & he has shooting pain with walking, hurts up to his groin, denies any warmth in this area. Dr. Alen Blew & K. Walislewicz PA informed, doppler study ordered, patient will be seen in [facility] tomorrow.  Patient informed of his doppler study at [facility] at 9:00 tomorrow 01/18/21, and then appointment at [facility] at 10:00.  Patient verbalizes understanding.

## 2021-01-18 ENCOUNTER — Ambulatory Visit (HOSPITAL_COMMUNITY)
Admission: RE | Admit: 2021-01-18 | Discharge: 2021-01-18 | Disposition: A | Discharge status: Home / Self Care | Payer: Medicare Other | Source: Ambulatory Visit | Attending: Oncology | Admitting: Oncology

## 2021-01-18 ENCOUNTER — Inpatient Hospital Stay: Payer: Medicare Other | Attending: Oncology | Admitting: Physician Assistant

## 2021-01-18 ENCOUNTER — Ambulatory Visit (HOSPITAL_COMMUNITY)
Admission: RE | Admit: 2021-01-18 | Discharge: 2021-01-18 | Disposition: A | Discharge status: Home / Self Care | Payer: Medicare Other | Source: Ambulatory Visit | Attending: Physician Assistant | Admitting: Physician Assistant

## 2021-01-18 ENCOUNTER — Other Ambulatory Visit: Payer: Self-pay

## 2021-01-18 VITALS — BP 157/89 | HR 73 | Temp 97.6°F | Resp 18

## 2021-01-18 DIAGNOSIS — C61 Malignant neoplasm of prostate: Secondary | ICD-10-CM | POA: Diagnosis not present

## 2021-01-18 DIAGNOSIS — R600 Localized edema: Secondary | ICD-10-CM | POA: Insufficient documentation

## 2021-01-18 DIAGNOSIS — C7951 Secondary malignant neoplasm of bone: Secondary | ICD-10-CM | POA: Insufficient documentation

## 2021-01-18 DIAGNOSIS — M25562 Pain in left knee: Secondary | ICD-10-CM | POA: Insufficient documentation

## 2021-01-18 DIAGNOSIS — M7989 Other specified soft tissue disorders: Secondary | ICD-10-CM | POA: Diagnosis not present

## 2021-01-18 DIAGNOSIS — G8929 Other chronic pain: Secondary | ICD-10-CM | POA: Diagnosis not present

## 2021-01-18 DIAGNOSIS — I10 Essential (primary) hypertension: Secondary | ICD-10-CM | POA: Diagnosis not present

## 2021-01-18 NOTE — Patient Instructions (Signed)
I will call you with the xray results later today or Monday morning.  You can try over the counter tylenol arthritis for pain or applying a lidocaine patch.  Elevate your leg as much as possible. You can buy a compression sleeve or brace at the store if that helps with pain.  Call Weston Anna for follow up [phone]

## 2021-01-18 NOTE — Progress Notes (Signed)
Left lower extremity venous duplex has been completed. Preliminary results can be found in CV Proc through chart review.  Results were given to [place] at Dr. Hazeline Junker office.  01/18/21 8:44 AM Philip [name] RVT
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
## 2021-01-30 ENCOUNTER — Other Ambulatory Visit: Payer: Self-pay | Admitting: Internal Medicine

## 2021-01-30 DIAGNOSIS — I10 Essential (primary) hypertension: Secondary | ICD-10-CM

## 2021-01-31 ENCOUNTER — Other Ambulatory Visit: Payer: Self-pay

## 2021-01-31 ENCOUNTER — Ambulatory Visit (HOSPITAL_COMMUNITY)
Admission: RE | Admit: 2021-01-31 | Discharge: 2021-01-31 | Disposition: A | Discharge status: Home / Self Care | Payer: Medicare Other | Source: Ambulatory Visit | Attending: Oncology | Admitting: Oncology

## 2021-01-31 ENCOUNTER — Ambulatory Visit (HOSPITAL_COMMUNITY): Payer: Medicare Other

## 2021-01-31 DIAGNOSIS — C7951 Secondary malignant neoplasm of bone: Secondary | ICD-10-CM | POA: Diagnosis not present

## 2021-01-31 DIAGNOSIS — C61 Malignant neoplasm of prostate: Secondary | ICD-10-CM | POA: Insufficient documentation

## 2021-01-31 DIAGNOSIS — I700 Atherosclerosis of aorta: Secondary | ICD-10-CM | POA: Diagnosis not present

## 2021-02-05 ENCOUNTER — Other Ambulatory Visit: Payer: Self-pay

## 2021-02-05 ENCOUNTER — Inpatient Hospital Stay: Payer: Medicare Other | Attending: Oncology

## 2021-02-05 DIAGNOSIS — C61 Malignant neoplasm of prostate: Secondary | ICD-10-CM | POA: Diagnosis not present

## 2021-02-05 DIAGNOSIS — C7951 Secondary malignant neoplasm of bone: Secondary | ICD-10-CM | POA: Insufficient documentation

## 2021-02-05 DIAGNOSIS — Z95828 Presence of other vascular implants and grafts: Secondary | ICD-10-CM

## 2021-02-05 LAB — CBC WITH DIFFERENTIAL (CANCER CENTER ONLY)
Abs Immature Granulocytes: 0.01 10*3/uL (ref 0.00–0.07)
Basophils Absolute: 0 10*3/uL (ref 0.0–0.1)
Basophils Relative: 0 %
Eosinophils Absolute: 0.1 10*3/uL (ref 0.0–0.5)
Eosinophils Relative: 2 %
HCT: 37.5 % — ABNORMAL LOW (ref 39.0–52.0)
Hemoglobin: 12.4 g/dL — ABNORMAL LOW (ref 13.0–17.0)
Immature Granulocytes: 0 %
Lymphocytes Relative: 34 %
Lymphs Abs: 2 10*3/uL (ref 0.7–4.0)
MCH: 30.3 pg (ref 26.0–34.0)
MCHC: 33.1 g/dL (ref 30.0–36.0)
MCV: 91.7 fL (ref 80.0–100.0)
Monocytes Absolute: 0.6 10*3/uL (ref 0.1–1.0)
Monocytes Relative: 10 %
Neutro Abs: 3.1 10*3/uL (ref 1.7–7.7)
Neutrophils Relative %: 54 %
Platelet Count: 224 10*3/uL (ref 150–400)
RBC: 4.09 MIL/uL — ABNORMAL LOW (ref 4.22–5.81)
RDW: 13.1 % (ref 11.5–15.5)
WBC Count: 5.8 10*3/uL (ref 4.0–10.5)
nRBC: 0 % (ref 0.0–0.2)

## 2021-02-05 LAB — CMP (CANCER CENTER ONLY)
ALT: 16 U/L (ref 0–44)
AST: 19 U/L (ref 15–41)
Albumin: 3.7 g/dL (ref 3.5–5.0)
Alkaline Phosphatase: 56 U/L (ref 38–126)
Anion gap: 9 (ref 5–15)
BUN: 20 mg/dL (ref 8–23)
CO2: 23 mmol/L (ref 22–32)
Calcium: 8.9 mg/dL (ref 8.9–10.3)
Chloride: 103 mmol/L (ref 98–111)
Creatinine: 1.15 mg/dL (ref 0.61–1.24)
GFR, Estimated: >60 mL/min (ref >60)
Glucose, Bld: 99 mg/dL (ref 70–99)
Potassium: 3.9 mmol/L (ref 3.5–5.1)
Sodium: 135 mmol/L (ref 135–145)
Total Bilirubin: 0.5 mg/dL (ref 0.3–1.2)
Total Protein: 6.5 g/dL (ref 6.5–8.1)

## 2021-02-05 MED ORDER — SODIUM CHLORIDE 0.9% FLUSH
10.0000 mL | Freq: Once | INTRAVENOUS | Status: AC
Start: 2021-02-05 — End: 2021-02-05
  Administered 2021-02-05: 10 mL

## 2021-02-05 MED ORDER — HEPARIN SOD (PORK) LOCK FLUSH 100 UNIT/ML IV SOLN
500.0000 [IU] | Freq: Once | INTRAVENOUS | Status: AC
  Administered 2021-02-05: 500 [IU]

## 2021-02-06 LAB — PROSTATE-SPECIFIC AG, SERUM (LABCORP): Prostate Specific Ag, Serum: <0.1 ng/mL (ref 0.0–4.0)

## 2021-02-07 ENCOUNTER — Inpatient Hospital Stay (HOSPITAL_BASED_OUTPATIENT_CLINIC_OR_DEPARTMENT_OTHER): Payer: Medicare Other | Admitting: Oncology

## 2021-02-07 ENCOUNTER — Other Ambulatory Visit: Payer: Self-pay

## 2021-02-07 ENCOUNTER — Inpatient Hospital Stay: Payer: Medicare Other

## 2021-02-07 ENCOUNTER — Other Ambulatory Visit: Payer: Self-pay | Admitting: Internal Medicine

## 2021-02-07 VITALS — BP 149/83 | HR 90 | Temp 98.2°F | Resp 17 | Ht 76.0 in | Wt 199.1 lb

## 2021-02-07 DIAGNOSIS — I700 Atherosclerosis of aorta: Secondary | ICD-10-CM

## 2021-02-07 DIAGNOSIS — Z95828 Presence of other vascular implants and grafts: Secondary | ICD-10-CM

## 2021-02-07 DIAGNOSIS — C61 Malignant neoplasm of prostate: Secondary | ICD-10-CM

## 2021-02-07 DIAGNOSIS — C7951 Secondary malignant neoplasm of bone: Secondary | ICD-10-CM | POA: Diagnosis not present

## 2021-02-07 DIAGNOSIS — E785 Hyperlipidemia, unspecified: Secondary | ICD-10-CM

## 2021-02-07 MED ORDER — DENOSUMAB 120 MG/1.7ML ~~LOC~~ SOLN
120.0000 mg | Freq: Once | SUBCUTANEOUS | Status: AC
  Administered 2021-02-07: 120 mg via SUBCUTANEOUS
  Filled 2021-02-07: qty 1.7

## 2021-02-07 NOTE — Progress Notes (Signed)
Hematology and Oncology Follow Up Visit  Philip [name] [id number] Sep 16, 1945 75 y.o. 02/07/2021 12:43 PM Philip [name], MDJones, [name], MD   Principle Diagnosis: 75 year old man with advanced prostate cancer with disease to the bone and lymphadenopathy presented with a PSA of 2900 in 2016.  He has castration-resistant disease.   Prior Therapy: Status post orchiectomy done on November 2016.  Taxotere chemotherapy at 75 mg/m to start on 02/16/2015. He is completed 6 cycles of therapy in March 2017.  Current therapy: Zytiga 1000 mg daily started in July 2017.    Interim History:  [name] presents today for a follow-up visit.  Since the last visit, he reports no major changes in his health.  He has reported some knee pain and swelling in his leg which has improved at this time.  His evaluation did not show any evidence of deep vein thrombosis or trauma.  His performance status quality of life remained excellent.  He continues to tolerate Zytiga without any issues or concerns.  His performance status quality of life remains excellent.  Medications: Updated on review. Current Outpatient Medications  Medication Sig Dispense Refill   abiraterone acetate (ZYTIGA) 250 MG tablet TAKE 4 TABLETS BY MOUTH ONCE DAILY AS DIRECTED.  TAKE 1 HOUR BEFORE OR 2 HOURS AFTER A MEAL 120 tablet 10   aspirin EC 81 MG tablet Take 81 mg by mouth daily.     atorvastatin (LIPITOR) 10 MG tablet TAKE 1 TABLET BY MOUTH EVERY DAY 90 tablet 1   BIOTIN 5000 PO Take 1 tablet by mouth daily.     Denosumab (XGEVA [place]) Inject into the skin.     fexofenadine (ALLEGRA) 180 MG tablet Take 180 mg by mouth daily.     hydrOXYzine (ATARAX/VISTARIL) 10 MG tablet 1 TABLET BY MOUTH EVERY 8 HOURS AS NEEDED     indapamide (LOZOL) 1.25 MG tablet TAKE 1 TABLET BY MOUTH EVERY DAY 90 tablet 1   KLOR-CON M20 20 MEQ tablet TAKE 1 TABLET BY MOUTH TWICE A DAY 180 tablet 0   lidocaine-prilocaine (EMLA) cream Apply 1 application topically as  needed. 30 g 0   LORazepam (ATIVAN) 1 MG tablet Take 1 tablet (1 mg total) by mouth every 8 (eight) hours as needed. for anxiety 30 tablet 0   LORazepam (ATIVAN) 1 MG tablet TAKE 1 TABLET BY MOUTH EVERY 8 HOURS AS NEEDED FOR ANXIETY 90 tablet 0   meloxicam (MOBIC) 7.5 MG tablet TAKE 1 TABLET BY MOUTH EVERY DAY 90 tablet 0   Multiple Vitamin (MULTIVITAMIN) tablet Take 1 tablet by mouth daily.     olmesartan (BENICAR) 20 MG tablet Take 1 tablet (20 mg total) by mouth daily. 90 tablet 1   traMADol (ULTRAM) 50 MG tablet Take 1 tablet (50 mg total) by mouth every 12 (twelve) hours as needed for moderate pain. 180 tablet 1   No current facility-administered medications for this visit.     Allergies:  Allergies  Allergen Reactions   Prednisone Anxiety      Physical Exam:   Blood pressure (!) 149/83, pulse 90, temperature 98.2 F (36.8 C), temperature source Temporal, resp. rate 17, height 6\' 4"  (1.93 m), weight 199 lb 1.6 oz (90.3 kg), SpO2 100 %.      ECOG: 0     General appearance: Alert, awake without any distress. Head: Atraumatic without abnormalities Oropharynx: Without any thrush or ulcers. Eyes: No scleral icterus. Lymph nodes: No lymphadenopathy noted in the cervical, supraclavicular, or axillary nodes  Heart:regular rate and rhythm, without any murmurs or gallops.   Lung: Clear to auscultation without any rhonchi, wheezes or dullness to percussion. Abdomin: Soft, nontender without any shifting dullness or ascites. Musculoskeletal: No clubbing or cyanosis. Neurological: No motor or sensory deficits. Skin: No rashes or lesions.                       Lab Results: Lab Results  Component Value Date   WBC 5.8 02/05/2021   HGB 12.4 (L) 02/05/2021   HCT 37.5 (L) 02/05/2021   MCV 91.7 02/05/2021   PLT 224 02/05/2021     Chemistry      Component Value Date/Time   NA 135 02/05/2021 1403   NA 139 02/10/2017 1323   K 3.9 02/05/2021 1403   K 3.4  (L) 02/10/2017 1323   CL 103 02/05/2021 1403   CO2 23 02/05/2021 1403   CO2 23 02/10/2017 1323   BUN 20 02/05/2021 1403   BUN 13.6 02/10/2017 1323   CREATININE 1.15 02/05/2021 1403   CREATININE 0.9 02/10/2017 1323      Component Value Date/Time   CALCIUM 8.9 02/05/2021 1403   CALCIUM 9.2 02/10/2017 1323   ALKPHOS 56 02/05/2021 1403   ALKPHOS 50 02/10/2017 1323   AST 19 02/05/2021 1403   AST 20 02/10/2017 1323   ALT 16 02/05/2021 1403   ALT 15 02/10/2017 1323   BILITOT 0.5 02/05/2021 1403   BILITOT 0.52 02/10/2017 1323           Latest Reference Range & Units 09/19/20 14:24 11/21/20 14:01 02/05/21 14:03  Prostate Specific Ag, Serum 0.0 - 4.0 ng/mL <0.1 <0.1 <0.1   IMPRESSION: 1. Several (3) small lymph nodes along the external iliac chains have significant radiotracer activity and are concerning but indeterminate for local prostate cancer nodal metastasis. 2. No evidence of metastatic adenopathy outside the pelvis. 3. No evidence of visceral metastasis. 4. Multiple sclerotic lesions throughout the skeleton consistent prostate skeletal metastasis. The lesions do NOT have radiotracer activity therefore favored treated/dormant metastasis.        Impression and Plan:  75 year old man with:  1.  Advanced prostate cancer with disease to the bone and lymphadenopathy diagnosed in 2016.  He has castration-resistant at this time.   His disease status was updated at this time and treatment choices were reviewed.  He continues to have PSA that is undetectable with excellent response documented on his PSMA PET scan.  These were personally reviewed today and showed no evidence of relapsed disease.  I recommended continuing Zytiga at this time.  Complications long-term including hypertension and fluid retention among others were reviewed.  He is agreeable to continue.  We will continue to monitor his PSA especially with his PET positive lymph nodes and consideration for radiation  as a salvage would be reviewed.   2. IV access: Port-A-Cath continues to be flushed periodically.  3. Hypertension: His blood pressure close to normal range at this time.  We will continue to monitor on Zytiga.  4.  Androgen depravation: He is status post orchiectomy without any additional need for androgen depravation.  5.  Bone directed therapy: He is currently on Xgeva which I recommended continuing.  Complications that include osteonecrosis of the jaw and hypocalcemia were reviewed.  6.  Prognosis: Therapy remains palliative although aggressive measures are warranted given his excellent performance status and response.  7.  Anxiety: Manageable at this time.  8. Follow-up: In 8 weeks for repeat follow-up.  30 minutes were spent on this encounter.  The time was dedicated to reviewing imaging studies, laboratory data, treatment choices and future plan of care discussion.  Zola Button, MD 12/8/202212:43 PM

## 2021-02-07 NOTE — Patient Instructions (Signed)
Denosumab injection What is this medication? DENOSUMAB (den oh sue mab) slows bone breakdown. Prolia is used to treat osteoporosis in women after menopause and in men, and in people who are taking corticosteroids for 6 months or more. Xgeva is used to treat a high calcium level due to cancer and to prevent bone fractures and other bone problems caused by multiple myeloma or cancer bone metastases. Xgeva is also used to treat giant cell tumor of the bone. This medicine may be used for other purposes; ask your health care provider or pharmacist if you have questions. COMMON BRAND NAME(S): Prolia, XGEVA What should I tell my care team before I take this medication? They need to know if you have any of these conditions: dental disease having surgery or tooth extraction infection kidney disease low levels of calcium or Vitamin D in the blood malnutrition on hemodialysis skin conditions or sensitivity thyroid or parathyroid disease an unusual reaction to denosumab, other medicines, foods, dyes, or preservatives pregnant or trying to get pregnant breast-feeding How should I use this medication? This medicine is for injection under the skin. It is given by a health care professional in a hospital or clinic setting. A special MedGuide will be given to you before each treatment. Be sure to read this information carefully each time. For Prolia, talk to your pediatrician regarding the use of this medicine in children. Special care may be needed. For Xgeva, talk to your pediatrician regarding the use of this medicine in children. While this drug may be prescribed for children as young as 13 years for selected conditions, precautions do apply. Overdosage: If you think you have taken too much of this medicine contact a poison control center or emergency room at once. NOTE: This medicine is only for you. Do not share this medicine with others. What if I miss a dose? It is important not to miss your dose.  Call your doctor or health care professional if you are unable to keep an appointment. What may interact with this medication? Do not take this medicine with any of the following medications: other medicines containing denosumab This medicine may also interact with the following medications: medicines that lower your chance of fighting infection steroid medicines like prednisone or cortisone This list may not describe all possible interactions. Give your health care provider a list of all the medicines, herbs, non-prescription drugs, or dietary supplements you use. Also tell them if you smoke, drink alcohol, or use illegal drugs. Some items may interact with your medicine. What should I watch for while using this medication? Visit your doctor or health care professional for regular checks on your progress. Your doctor or health care professional may order blood tests and other tests to see how you are doing. Call your doctor or health care professional for advice if you get a fever, chills or sore throat, or other symptoms of a cold or flu. Do not treat yourself. This drug may decrease your body's ability to fight infection. Try to avoid being around people who are sick. You should make sure you get enough calcium and vitamin D while you are taking this medicine, unless your doctor tells you not to. Discuss the foods you eat and the vitamins you take with your health care professional. See your dentist regularly. Brush and floss your teeth as directed. Before you have any dental work done, tell your dentist you are receiving this medicine. Do not become pregnant while taking this medicine or for 5 months after   stopping it. Talk with your doctor or health care professional about your birth control options while taking this medicine. Women should inform their doctor if they wish to become pregnant or think they might be pregnant. There is a potential for serious side effects to an unborn child. Talk to  your health care professional or pharmacist for more information. What side effects may I notice from receiving this medication? Side effects that you should report to your doctor or health care professional as soon as possible: allergic reactions like skin rash, itching or hives, swelling of the face, lips, or tongue bone pain breathing problems dizziness jaw pain, especially after dental work redness, blistering, peeling of the skin signs and symptoms of infection like fever or chills; cough; sore throat; pain or trouble passing urine signs of low calcium like fast heartbeat, muscle cramps or muscle pain; pain, tingling, numbness in the hands or feet; seizures unusual bleeding or bruising unusually weak or tired Side effects that usually do not require medical attention (report to your doctor or health care professional if they continue or are bothersome): constipation diarrhea headache joint pain loss of appetite muscle pain runny nose tiredness upset stomach This list may not describe all possible side effects. Call your doctor for medical advice about side effects. You may report side effects to FDA at 1-800-FDA-1088. Where should I keep my medication? This medicine is only given in a clinic, doctor's office, or other health care setting and will not be stored at home. NOTE: This sheet is a summary. It may not cover all possible information. If you have questions about this medicine, talk to your doctor, pharmacist, or health care provider.  2022 Elsevier/Gold Standard (2017-06-26 00:00:00)

## 2021-02-19 ENCOUNTER — Other Ambulatory Visit: Payer: Self-pay | Admitting: *Deleted

## 2021-02-19 ENCOUNTER — Other Ambulatory Visit (HOSPITAL_COMMUNITY): Payer: Self-pay

## 2021-02-19 DIAGNOSIS — C61 Malignant neoplasm of prostate: Secondary | ICD-10-CM

## 2021-02-19 MED ORDER — ABIRATERONE ACETATE 250 MG PO TABS
ORAL_TABLET | ORAL | 10 refills | Status: DC
  Filled 2021-02-19: qty 120, fill #0

## 2021-02-20 ENCOUNTER — Other Ambulatory Visit (HOSPITAL_COMMUNITY): Payer: Self-pay

## 2021-02-20 ENCOUNTER — Encounter: Payer: Self-pay | Admitting: Oncology

## 2021-02-21 ENCOUNTER — Other Ambulatory Visit (HOSPITAL_COMMUNITY): Payer: Self-pay

## 2021-02-21 NOTE — Telephone Encounter (Signed)
Encounter opened in error

## 2021-02-27 ENCOUNTER — Other Ambulatory Visit: Payer: Self-pay | Admitting: Oncology

## 2021-02-27 DIAGNOSIS — C61 Malignant neoplasm of prostate: Secondary | ICD-10-CM

## 2021-03-06 ENCOUNTER — Encounter: Payer: Self-pay | Admitting: Internal Medicine

## 2021-03-07 ENCOUNTER — Inpatient Hospital Stay (HOSPITAL_COMMUNITY): Payer: Medicare Other | Admitting: Anesthesiology

## 2021-03-07 ENCOUNTER — Ambulatory Visit (INDEPENDENT_AMBULATORY_CARE_PROVIDER_SITE_OTHER): Payer: Medicare Other

## 2021-03-07 ENCOUNTER — Encounter (HOSPITAL_COMMUNITY)
Admission: EM | Disposition: A | Discharge status: Home / Self Care | Payer: Self-pay | Source: Home / Self Care | Attending: Internal Medicine

## 2021-03-07 ENCOUNTER — Inpatient Hospital Stay (HOSPITAL_COMMUNITY): Payer: Medicare Other

## 2021-03-07 ENCOUNTER — Ambulatory Visit
Admission: RE | Admit: 2021-03-07 | Discharge: 2021-03-07 | Disposition: A | Discharge status: Home / Self Care | Payer: Medicare Other | Source: Ambulatory Visit | Attending: Internal Medicine | Admitting: Internal Medicine

## 2021-03-07 ENCOUNTER — Inpatient Hospital Stay (HOSPITAL_COMMUNITY)
Admission: EM | Admit: 2021-03-07 | Discharge: 2021-03-09 | DRG: 345 | Disposition: A | Discharge status: Home / Self Care | Payer: Medicare Other | Attending: Internal Medicine | Admitting: Internal Medicine

## 2021-03-07 ENCOUNTER — Ambulatory Visit (INDEPENDENT_AMBULATORY_CARE_PROVIDER_SITE_OTHER): Payer: Medicare Other | Admitting: Internal Medicine

## 2021-03-07 ENCOUNTER — Other Ambulatory Visit: Payer: Self-pay

## 2021-03-07 ENCOUNTER — Encounter: Payer: Self-pay | Admitting: Internal Medicine

## 2021-03-07 ENCOUNTER — Encounter (HOSPITAL_COMMUNITY): Payer: Self-pay

## 2021-03-07 ENCOUNTER — Encounter: Payer: Self-pay | Admitting: Oncology

## 2021-03-07 VITALS — BP 146/88 | HR 105 | Temp 98.1°F | Ht 76.0 in | Wt 199.0 lb

## 2021-03-07 DIAGNOSIS — I10 Essential (primary) hypertension: Secondary | ICD-10-CM | POA: Diagnosis present

## 2021-03-07 DIAGNOSIS — R10817 Generalized abdominal tenderness: Secondary | ICD-10-CM

## 2021-03-07 DIAGNOSIS — D509 Iron deficiency anemia, unspecified: Secondary | ICD-10-CM | POA: Insufficient documentation

## 2021-03-07 DIAGNOSIS — R933 Abnormal findings on diagnostic imaging of other parts of digestive tract: Secondary | ICD-10-CM | POA: Diagnosis present

## 2021-03-07 DIAGNOSIS — Z20822 Contact with and (suspected) exposure to covid-19: Secondary | ICD-10-CM | POA: Diagnosis present

## 2021-03-07 DIAGNOSIS — D539 Nutritional anemia, unspecified: Secondary | ICD-10-CM | POA: Diagnosis not present

## 2021-03-07 DIAGNOSIS — T502X5A Adverse effect of carbonic-anhydrase inhibitors, benzothiadiazides and other diuretics, initial encounter: Secondary | ICD-10-CM | POA: Diagnosis not present

## 2021-03-07 DIAGNOSIS — F419 Anxiety disorder, unspecified: Secondary | ICD-10-CM | POA: Diagnosis present

## 2021-03-07 DIAGNOSIS — K219 Gastro-esophageal reflux disease without esophagitis: Secondary | ICD-10-CM

## 2021-03-07 DIAGNOSIS — K769 Liver disease, unspecified: Secondary | ICD-10-CM | POA: Diagnosis not present

## 2021-03-07 DIAGNOSIS — K5900 Constipation, unspecified: Secondary | ICD-10-CM | POA: Diagnosis not present

## 2021-03-07 DIAGNOSIS — K5904 Chronic idiopathic constipation: Secondary | ICD-10-CM | POA: Diagnosis not present

## 2021-03-07 DIAGNOSIS — D6489 Other specified anemias: Secondary | ICD-10-CM | POA: Diagnosis not present

## 2021-03-07 DIAGNOSIS — D508 Other iron deficiency anemias: Secondary | ICD-10-CM

## 2021-03-07 DIAGNOSIS — E876 Hypokalemia: Secondary | ICD-10-CM

## 2021-03-07 DIAGNOSIS — K5939 Other megacolon: Secondary | ICD-10-CM | POA: Diagnosis present

## 2021-03-07 DIAGNOSIS — Z8249 Family history of ischemic heart disease and other diseases of the circulatory system: Secondary | ICD-10-CM

## 2021-03-07 DIAGNOSIS — K562 Volvulus: Principal | ICD-10-CM | POA: Diagnosis present

## 2021-03-07 DIAGNOSIS — C61 Malignant neoplasm of prostate: Secondary | ICD-10-CM | POA: Diagnosis present

## 2021-03-07 DIAGNOSIS — Z79899 Other long term (current) drug therapy: Secondary | ICD-10-CM | POA: Diagnosis not present

## 2021-03-07 DIAGNOSIS — K6389 Other specified diseases of intestine: Secondary | ICD-10-CM | POA: Diagnosis not present

## 2021-03-07 DIAGNOSIS — E785 Hyperlipidemia, unspecified: Secondary | ICD-10-CM | POA: Diagnosis present

## 2021-03-07 DIAGNOSIS — C7951 Secondary malignant neoplasm of bone: Secondary | ICD-10-CM | POA: Diagnosis present

## 2021-03-07 DIAGNOSIS — K449 Diaphragmatic hernia without obstruction or gangrene: Secondary | ICD-10-CM | POA: Diagnosis present

## 2021-03-07 DIAGNOSIS — K7689 Other specified diseases of liver: Secondary | ICD-10-CM | POA: Diagnosis not present

## 2021-03-07 DIAGNOSIS — Z888 Allergy status to other drugs, medicaments and biological substances status: Secondary | ICD-10-CM

## 2021-03-07 DIAGNOSIS — K559 Vascular disorder of intestine, unspecified: Secondary | ICD-10-CM | POA: Diagnosis not present

## 2021-03-07 HISTORY — PX: FLEXIBLE SIGMOIDOSCOPY: SHX5431

## 2021-03-07 HISTORY — PX: BOWEL DECOMPRESSION: SHX5532

## 2021-03-07 LAB — COMPREHENSIVE METABOLIC PANEL
ALT: 19 U/L (ref 0–44)
AST: 21 U/L (ref 15–41)
Albumin: 4 g/dL (ref 3.5–5.0)
Alkaline Phosphatase: 54 U/L (ref 38–126)
Anion gap: 7 (ref 5–15)
BUN: 18 mg/dL (ref 8–23)
CO2: 25 mmol/L (ref 22–32)
Calcium: 9.2 mg/dL (ref 8.9–10.3)
Chloride: 104 mmol/L (ref 98–111)
Creatinine, Ser: 0.71 mg/dL (ref 0.61–1.24)
GFR, Estimated: >60 mL/min (ref >60)
Glucose, Bld: 117 mg/dL — ABNORMAL HIGH (ref 70–99)
Potassium: 2.3 mmol/L — CL (ref 3.5–5.1)
Sodium: 136 mmol/L (ref 135–145)
Total Bilirubin: 1.1 mg/dL (ref 0.3–1.2)
Total Protein: 7 g/dL (ref 6.5–8.1)

## 2021-03-07 LAB — MAGNESIUM
Magnesium: 1.9 mg/dL (ref 1.5–2.5)
Magnesium: 2 mg/dL (ref 1.7–2.4)

## 2021-03-07 LAB — RESP PANEL BY RT-PCR (FLU A&B, COVID) ARPGX2
Influenza A by PCR: NEGATIVE
Influenza B by PCR: NEGATIVE
SARS Coronavirus 2 by RT PCR: NEGATIVE

## 2021-03-07 LAB — CBC WITH DIFFERENTIAL/PLATELET
Basophils Absolute: 0 10*3/uL (ref 0.0–0.1)
Basophils Relative: 0.3 % (ref 0.0–3.0)
Eosinophils Absolute: 0.1 10*3/uL (ref 0.0–0.7)
Eosinophils Relative: 1 % (ref 0.0–5.0)
HCT: 38.1 % — ABNORMAL LOW (ref 39.0–52.0)
Hemoglobin: 12.8 g/dL — ABNORMAL LOW (ref 13.0–17.0)
Lymphocytes Relative: 17.5 % (ref 12.0–46.0)
Lymphs Abs: 1.4 10*3/uL (ref 0.7–4.0)
MCHC: 33.6 g/dL (ref 30.0–36.0)
MCV: 91.8 fl (ref 78.0–100.0)
Monocytes Absolute: 0.9 10*3/uL (ref 0.1–1.0)
Monocytes Relative: 11.4 % (ref 3.0–12.0)
Neutro Abs: 5.7 10*3/uL (ref 1.4–7.7)
Neutrophils Relative %: 69.8 % (ref 43.0–77.0)
Platelets: 242 10*3/uL (ref 150.0–400.0)
RBC: 4.15 Mil/uL — ABNORMAL LOW (ref 4.22–5.81)
RDW: 12.8 % (ref 11.5–15.5)
WBC: 8.1 10*3/uL (ref 4.0–10.5)

## 2021-03-07 LAB — URINALYSIS, ROUTINE W REFLEX MICROSCOPIC
Bilirubin Urine: NEGATIVE
Glucose, UA: NEGATIVE mg/dL
Hgb urine dipstick: NEGATIVE
Ketones, ur: 20 mg/dL — AB
Leukocytes,Ua: NEGATIVE
Nitrite: NEGATIVE
Protein, ur: NEGATIVE mg/dL
Specific Gravity, Urine: 1.034 — ABNORMAL HIGH (ref 1.005–1.030)
pH: 7 (ref 5.0–8.0)

## 2021-03-07 LAB — IBC + FERRITIN
Ferritin: 165.1 ng/mL (ref 22.0–322.0)
Iron: 30 ug/dL — ABNORMAL LOW (ref 42–165)
Saturation Ratios: 8 % — ABNORMAL LOW (ref 20.0–50.0)
TIBC: 375.2 ug/dL (ref 250.0–450.0)
Transferrin: 268 mg/dL (ref 212.0–360.0)

## 2021-03-07 LAB — TSH: TSH: 1.87 u[IU]/mL (ref 0.35–5.50)

## 2021-03-07 LAB — LIPASE: Lipase: 25 U/L (ref 11.0–59.0)

## 2021-03-07 LAB — VITAMIN B12: Vitamin B-12: 528 pg/mL (ref 211–911)

## 2021-03-07 LAB — AMYLASE: Amylase: 42 U/L (ref 27–131)

## 2021-03-07 LAB — FOLATE: Folate: >24.2 ng/mL (ref >5.9)

## 2021-03-07 SURGERY — SIGMOIDOSCOPY, FLEXIBLE
Anesthesia: Monitor Anesthesia Care

## 2021-03-07 MED ORDER — PROPOFOL 10 MG/ML IV BOLUS
INTRAVENOUS | Status: DC | PRN
  Administered 2021-03-07 (×2): 20 mg via INTRAVENOUS
  Administered 2021-03-07: 40 mg via INTRAVENOUS

## 2021-03-07 MED ORDER — CHLORHEXIDINE GLUCONATE CLOTH 2 % EX PADS
6.0000 | MEDICATED_PAD | Freq: Every day | CUTANEOUS | Status: DC
  Administered 2021-03-08 – 2021-03-09 (×2): 6 via TOPICAL

## 2021-03-07 MED ORDER — PROPOFOL 10 MG/ML IV BOLUS
INTRAVENOUS | Status: AC
  Filled 2021-03-07: qty 20

## 2021-03-07 MED ORDER — HYDRALAZINE HCL 20 MG/ML IJ SOLN: 10.0000 mg | Freq: Three times a day (TID) | INTRAMUSCULAR | Status: DC | PRN

## 2021-03-07 MED ORDER — ONDANSETRON HCL 4 MG/2ML IJ SOLN: 4.0000 mg | Freq: Four times a day (QID) | INTRAMUSCULAR | Status: DC | PRN

## 2021-03-07 MED ORDER — ONDANSETRON HCL 4 MG PO TABS: 4.0000 mg | ORAL_TABLET | Freq: Four times a day (QID) | ORAL | Status: DC | PRN

## 2021-03-07 MED ORDER — IOPAMIDOL (ISOVUE-300) INJECTION 61%
100.0000 mL | Freq: Once | INTRAVENOUS | Status: AC | PRN
  Administered 2021-03-07: 100 mL via INTRAVENOUS

## 2021-03-07 MED ORDER — LACTATED RINGERS IV SOLN: INTRAVENOUS | Status: AC

## 2021-03-07 MED ORDER — PROPOFOL 500 MG/50ML IV EMUL
INTRAVENOUS | Status: AC
  Filled 2021-03-07: qty 50

## 2021-03-07 MED ORDER — MORPHINE SULFATE (PF) 2 MG/ML IV SOLN: 2.0000 mg | INTRAVENOUS | Status: DC | PRN

## 2021-03-07 MED ORDER — SODIUM CHLORIDE 0.9 % IV SOLN: INTRAVENOUS | Status: DC

## 2021-03-07 MED ORDER — POTASSIUM CHLORIDE 10 MEQ/100ML IV SOLN
10.0000 meq | INTRAVENOUS | Status: AC
  Administered 2021-03-07 (×6): 10 meq via INTRAVENOUS
  Filled 2021-03-07 (×7): qty 100

## 2021-03-07 MED ORDER — SODIUM CHLORIDE 0.9% FLUSH
10.0000 mL | INTRAVENOUS | Status: DC | PRN
  Administered 2021-03-09: 10 mL

## 2021-03-07 MED ORDER — PROPOFOL 500 MG/50ML IV EMUL
INTRAVENOUS | Status: DC | PRN
  Administered 2021-03-07: 120 ug/kg/min via INTRAVENOUS

## 2021-03-07 NOTE — Progress Notes (Signed)
Attempted to give report to ED x2, unsuccessful. Report given to Myriam Jacobson, RN who is receiving pt on 4E upon transfer from ED.

## 2021-03-07 NOTE — ED Provider Notes (Signed)
[facility] DEPT Provider Note   CSN: [id number] Arrival date & time: 03/07/21  1257     History  No chief complaint on file.   Philip [name] is a 76 y.o. male.  HPI     76 year old male with history of metastatic prostate cancer comes in with chief complaint of abdominal distention and abnormal CT scan.  Patient reports that he has been constipated over the last 4 days.  His last BM was prior to that.  He does not think he is passing flatus.  Patient has had some nausea without vomiting and is having a lot of acid reflux.  He saw his PCP who had ordered CT scan for him, and later on advised him to come to the ER.    Home Medications Prior to Admission medications   Medication Sig Start Date End Date Taking? Authorizing Provider  abiraterone acetate (ZYTIGA) 250 MG tablet TAKE 4 TABLETS BY MOUTH ONCE DAILY AS DIRECTED.  TAKE 1 HOUR BEFORE OR 2 HOURS AFTER A MEAL 02/19/21  Yes [name], MD  atorvastatin (LIPITOR) 10 MG tablet TAKE 1 TABLET BY MOUTH EVERY DAY 02/07/21  Yes [name], MD  BIOTIN 5000 PO Take 1 tablet by mouth daily. 02/02/15  Yes [provider]  CALCIUM-VITAMIN D PO Take 1 tablet by mouth daily.   Yes [provider]  fexofenadine (ALLEGRA) 180 MG tablet Take 180 mg by mouth daily.   Yes [provider]  hydrOXYzine (ATARAX/VISTARIL) 10 MG tablet Take 10 mg by mouth in the morning and at bedtime. 03/31/18  Yes [provider]  indapamide (LOZOL) 1.25 MG tablet TAKE 1 TABLET BY MOUTH EVERY DAY 01/30/21  Yes [name], MD  KLOR-CON M20 20 MEQ tablet TAKE 1 TABLET BY MOUTH TWICE A DAY 12/31/20  Yes [name], MD  lidocaine-prilocaine (EMLA) cream Apply 1 application topically as needed. Patient taking differently: Apply 1 application topically as needed (access port). 12/27/18  Yes Shadad, Mathis Dad, MD  LORazepam (ATIVAN) 1 MG tablet TAKE 1 TABLET BY MOUTH EVERY 8 HOURS AS NEEDED FOR  ANXIETY Patient taking differently: Take 1 mg by mouth 2 (two) times daily. 02/27/21  Yes [name], MD  Multiple Vitamin (MULTIVITAMIN) tablet Take 1 tablet by mouth daily.   Yes [provider]  olmesartan (BENICAR) 20 MG tablet Take 1 tablet (20 mg total) by mouth daily. 06/12/20  Yes [name], MD  traMADol (ULTRAM) 50 MG tablet Take 1 tablet (50 mg total) by mouth every 12 (twelve) hours as needed for moderate pain. 11/12/20  Yes [name], MD  Denosumab (XGEVA [place]) Inject into the skin.    [provider]  azelastine (ASTELIN) 0.1 % nasal spray PLACE 1 SPRAY INTO BOTH NOSTRILS 2 (TWO) TIMES DAILY. USE IN [facility] NOSTRIL AS DIRECTED 09/26/18 07/30/19  [name], MD      Allergies    Prednisone    Review of Systems   Review of Systems  Constitutional:  Positive for activity change.  Gastrointestinal:  Positive for abdominal pain and nausea.   Physical Exam Updated Vital Signs BP (!) 140/91    Pulse 90    Temp 97.9 F (36.6 C) (Oral)    Resp 19    Ht 6\' 4"  (1.93 m)    Wt 90.3 kg    SpO2 100%    BMI 24.22 kg/m  Physical Exam Vitals and nursing note reviewed.  Constitutional:  Appearance: He is well-developed.  HENT:     Head: Atraumatic.  Cardiovascular:     Rate and Rhythm: Normal rate.  Pulmonary:     Effort: Pulmonary effort is normal.  Abdominal:     General: There is distension.     Tenderness: There is abdominal tenderness.     Comments: Lower quadrant tenderness, no peritoneal findings  Musculoskeletal:     Cervical back: Neck supple.  Skin:    General: Skin is warm.  Neurological:     Mental Status: He is alert and oriented to person, place, and time.    ED Results / Procedures / Treatments   Labs (all labs ordered are listed, but only abnormal results are displayed) Labs Reviewed  COMPREHENSIVE METABOLIC PANEL - Abnormal; Notable for the following components:      Result Value   Potassium 2.3 (*)    Glucose, Bld 117  (*)    All other components within normal limits  RESP PANEL BY RT-PCR (FLU A&B, COVID) ARPGX2  URINALYSIS, ROUTINE W REFLEX MICROSCOPIC  MAGNESIUM    EKG None  Radiology CT Abdomen Pelvis W Contrast  Result Date: 03/07/2021 CLINICAL DATA:  Abdominal pain EXAM: CT ABDOMEN AND PELVIS WITH CONTRAST TECHNIQUE: Multidetector CT imaging of the abdomen and pelvis was performed using the standard protocol following bolus administration of intravenous contrast. CONTRAST:  148mL ISOVUE-300 IOPAMIDOL (ISOVUE-300) INJECTION 61% COMPARISON:  02/01/2020 FINDINGS: Lower chest: Subtle increased markings are seen in the left lower lung fields. There is elevation of left hemidiaphragm. Hepatobiliary: Liver measures 19.7 cm in length. There are few low-density lesions in the liver each measuring less than 1.3 cm with no significant change, possibly suggesting cysts or hemangiomas. In image 20 of series 2, there is an ill-defined 2.1 cm area of low attenuation in the right lobe of liver. Gallbladder is unremarkable. Pancreas: No focal abnormality is seen. Spleen: Spleen measures 13.4 cm in maximum diameter. Adrenals/Urinary Tract: There is mild nodularity in the adrenals. There is no hydronephrosis. There are no renal or ureteral stones. There is 2 cm smooth marginated low-density lesion in the upper pole of left kidney suggesting renal cysts. Urinary bladder is unremarkable. Stomach/Bowel: Small hiatal hernia is seen. Stomach is not distended. Small bowel loops are not dilated. Appendix is not dilated. There is no significant wall thickening in the colon. There is swirling of mesenteric vessels in the lower abdomen. There is abnormal dilation of portion of sigmoid colon. There is incomplete distention of rectosigmoid. Vascular/Lymphatic: There are scattered atherosclerotic plaques and calcifications. No significant lymphadenopathy seen. Reproductive: Unremarkable. Other: There is no pneumoperitoneum. There is small amount  of free fluid in the pelvic cavity, possibly related to acute sigmoid volvulus. Small umbilical hernia containing fat is seen. Musculoskeletal: Degenerative changes are noted in the lumbar spine, particularly severe at L4-L5 level with spinal stenosis and encroachment of neural foramina. There are patchy areas of sclerosis in the vertebral bodies, possibly due to disc degeneration. IMPRESSION: There is sigmoid volvulus. Surgical consultation should be considered. There is no significant small bowel obstruction. There is no ascites or pneumoperitoneum. There is no hydronephrosis. Appendix is not dilated. Small hiatal hernia. There are few low-density foci in the liver, possibly cysts or hemangiomas. There is an ill-defined 2.1 cm low-attenuation in the right lobe of liver which may be beam hardening artifact or space-occupying lesion. Short-term follow-up multiphasic CT may be considered. Enlarged liver and spleen. Imaging finding of sigmoid volvulus was relayed to Dr. Ronnald Ramp by telephone  call. Electronically Signed   By: Elmer Picker M.D.   On: 03/07/2021 12:06   DG ABD ACUTE 2+V W 1V CHEST  Addendum Date: 03/07/2021   ADDENDUM REPORT: 03/07/2021 09:46 ADDENDUM: These results were called by telephone at the time of interpretation on 03/07/2021 at 9:46 am to provider Scarlette Calico , who verbally acknowledged these results. Electronically Signed   By: Markus Daft M.D.   On: 03/07/2021 09:46   Result Date: 03/07/2021 CLINICAL DATA:  Generalized abdominal tenderness without rebound tenderness. Severe constipation. EXAM: DG ABDOMEN ACUTE WITH 1 VIEW CHEST COMPARISON:  PET-CT 01/31/2021 and chest radiograph 12/27/2019 and CT abdomen 02/01/2020 FINDINGS: Stable position of the right jugular Port-A-Cath with the tip near the superior cavoatrial junction. Heart size is normal. Lungs are clear. Large dilated bowel loop in the mid upper abdomen. The configuration raises concern for a potential sigmoid volvulus. There is  stool throughout the transverse and right colon. No evidence for free air. Patchy areas of sclerosis throughout the pelvic bones compatible with osseous metastatic disease. IMPRESSION: 1. Large dilated loop of bowel in the upper midline of the abdomen. Review of prior CT imaging demonstrates sigmoid colon in this region. Configuration of the dilated sigmoid colon raises concern for a possible volvulus. Recommend further characterization with a CT of the abdomen and pelvis. 2. Moderate to large amount of stool in the abdomen. 3. No acute chest abnormality. 4. Osseous metastatic disease. Electronically Signed: By: Markus Daft M.D. On: 03/07/2021 09:36    Procedures .Critical Care Performed by: Varney Biles, MD Authorized by: Varney Biles, MD   Critical care provider statement:    Critical care time (minutes):  32   Critical care was time spent personally by me on the following activities:  Development of treatment plan with patient or surrogate, discussions with consultants, evaluation of patient's response to treatment, examination of patient, ordering and review of laboratory studies, ordering and review of radiographic studies, ordering and performing treatments and interventions, pulse oximetry, re-evaluation of patient's condition, review of old charts and obtaining history from patient or surrogate    Medications Ordered in ED Medications  potassium chloride 10 mEq in 100 mL IVPB (has no administration in time range)  lactated ringers infusion (has no administration in time range)    ED Course/ Medical Decision Making/ A&P                           Medical Decision Making  This patient presents to the ED with chief complaint(s) of abdominal distention, which involves an extensive differential diagnosis and treatment options.  The complaint also carries with it a high risk of complications and morbidity.  Patient has history of metastatic prostate cancer, Abdominal distentionNo  abdominal surgical history and denies any history of colonoscopy.  I reviewed the outpatient CT scan independently and agree with the interpretation by the radiologist  Patient essentially has a sigmoid volvulus.  He has not been able to eat or drink as well over the last few days, therefore there is concerns for related electrolyte abnormality, AKI.    Additional history obtained: Records reviewed previous admission documents recent CT scan and xrays  The initial plan is to order basic labs and get Surgery consult.    Lab Tests: I Ordered, and personally interpreted labs.  The pertinent results include:  cbc, cmp   Cardiac Monitoring: The patient was maintained on a cardiac monitor.  I personally viewed and interpreted  the cardiac monitor which showed an underlying rhythm of:  sinus rhythm  Medicines ordered and prescription drug management: I ordered medications, including iv potassium  for severe hypokalemia  Reevaluation of the patient after these medicines showed that the patient    stayed the same   Consultations Obtained: I requested consultation with the consultant - Surgery and  [facility] , and discussed  findings as well as pertinent plan - they recommend: admit to hospitalist.    Complexity of problems addressed: Patients presentation is most consistent with  acute presentation with potential threat to life or bodily function  Disposition: After consideration of the diagnostic results and the patients response to treatment,  I feel that the patent would benefit from admission to medicine .          Final Clinical Impression(s) / ED Diagnoses Final diagnoses:  Sigmoid volvulus ([place])  Acute hypokalemia    Rx / DC Orders ED Discharge Orders     None         Varney Biles, MD 03/07/21 1550

## 2021-03-07 NOTE — Op Note (Signed)
[facility] Patient Name: Philip [name] Procedure Date: 03/07/2021 MRN: [id number] Attending MD: Carlota Raspberry. Havery Moros , MD Date of Birth: [birth year] CSN: [id number] Age: 76 Admit Type: Inpatient Procedure:                Flexible Sigmoidoscopy Indications:              For therapy of sigmoid volvulus Providers:                Remo Lipps P. Havery Moros, MD, Brooke Person, Luan Moore, Technician, Hedy Camara, CRNA, Referring MD:              Medicines:                Monitored Anesthesia Care Complications:            No immediate complications. Estimated blood loss:                            None. Estimated Blood Loss:     Estimated blood loss: none. Procedure:                Pre-Anesthesia Assessment:                           - Prior to the procedure, a History and Physical                            was performed, and patient medications and                            allergies were reviewed. The patient's tolerance of                            previous anesthesia was also reviewed. The risks                            and benefits of the procedure and the sedation                            options and risks were discussed with the patient.                            All questions were answered, and informed consent                            was obtained. Prior Anticoagulants: The patient has                            taken no previous anticoagulant or antiplatelet                            agents. ASA Grade Assessment: III - A patient with  severe systemic disease. After reviewing the risks                            and benefits, the patient was deemed in                            satisfactory condition to undergo the procedure.                           After obtaining informed consent, the scope was                            passed under direct vision. The PCF-HQ190L                            ([id number])  Olympus colonoscope was introduced                            through the anus and advanced to the the left                            transverse colon. The flexible sigmoidoscopy was                            technically difficult and complex due to inadequate                            bowel prep. The quality of the bowel preparation                            was poor. Scope In: Scope Out: Findings:      The perianal and digital rectal examinations were normal.      A large amount of solid stool was found in the entire colon, making       visualization difficult.      A suspected volvulus with viable appearing mucosa was found in the       sigmoid colon. Some of the mucosa was slightly pale in the area but no       gangrenous / ulcerated, or overtly ischemic tissue noted. Decompression       of the volvulus was performed, the colonoscope was able to traverse the       area and get to the proximal descending colon however this was navigated       slowly due to stool burden in the area. The colon seemed redundant.       Dilated colon was noted proximal to the volvulus which was decompressed       / suctioned. Following the maneuver, a rectal tube was placed and       secrued proximal to the volvulus to maintain the decompression. The       patient's abdomen felt much better post decompression.      The exam was otherwise without abnormality of what was visualized but       prep was poor, exam grossly inadequate for screening. Impression:               - Preparation of  the colon was poor, stool in the                            entire examined colon.                           - Volvulus with some mildly pale mucosa but no                            other overt ischemic change. Decompression                            performed with placement of rectal tube which was                            secured.                           - The examination was otherwise normal of what                             visualized but due to prep, inadequate for                            assessing for polyps, etc. Moderate Sedation:      No moderate sedation, case performed with MAC Recommendation:           - Return patient to hospital ward for ongoing care.                           - NPO for now, monitor for recurrence of volvulus.                           - Continue present medications.                           - Abdominal x ray now                           - Surgical team to follow                           - Patient will need full colonoscopy and EGD for                            iron deficiency anemia once recovered from this                            hospitalization                           - Call if questions, will follow Procedure Code(s):        --- Professional ---                           [phone], Sigmoidoscopy, flexible; with decompression                            (  for pathologic distention) (eg, volvulus,                            megacolon), including placement of decompression                            tube, when performed Diagnosis Code(s):        --- Professional ---                           K56.2, Volvulus CPT copyright 2019 [employer]. All rights reserved. The codes documented in this report are preliminary and upon coder review may  be revised to meet current compliance requirements. Remo Lipps P. Havery Moros, MD 03/07/2021 5:26:44 PM This report has been signed electronically. Number of Addenda: 0

## 2021-03-07 NOTE — Transfer of Care (Signed)
Immediate Anesthesia Transfer of Care Note  Patient: Philip [name]  Procedure(s) Performed: FLEXIBLE SIGMOIDOSCOPY  Patient Location: PACU  Anesthesia Type:MAC  Level of Consciousness: awake, alert , oriented and patient cooperative  Airway & Oxygen Therapy: Patient Spontanous Breathing and Patient connected to face mask oxygen  Post-op Assessment: Report given to RN and Post -op Vital signs reviewed and stable  Post vital signs: Reviewed and stable  Last Vitals:  Vitals Value Taken Time  BP 126/80 1718  Temp    Pulse 82   Resp 13   SpO2 100%     Last Pain:  Vitals:   03/07/21 1618  TempSrc: Temporal  PainSc: 10-Worst pain ever         Complications: No notable events documented.

## 2021-03-07 NOTE — Patient Instructions (Signed)

## 2021-03-07 NOTE — ED Provider Triage Note (Signed)
Emergency Medicine Provider Triage Evaluation Note  Philip [name] , a 76 y.o. male  was evaluated in triage.  Pt complains of abdominal pain and constipation for 4 days.  Patient was seen at his primary doctor earlier today, they sent him for CT scan which showed a sigmoid volvulus.  He states he has not had a bowel movement in 4 days.  Review of Systems  Positive: Abdominal pain, constipation Negative: Fever, chills, nausea, vomiting  Physical Exam  BP (!) 150/112 (BP Location: Left Arm)    Pulse (!) 105    Temp 97.9 F (36.6 C) (Oral)    Resp 16    Ht 6\' 4"  (1.93 m)    Wt 90.3 kg    SpO2 98%    BMI 24.22 kg/m  Gen:   Awake, no distress   Resp:  Normal effort  MSK:   Moves extremities without difficulty  Other:    Medical Decision Making  Medically screening exam initiated at 1:29 PM.  Appropriate orders placed.  Philip [name] was informed that the remainder of the evaluation will be completed by another provider, this initial triage assessment does not replace that evaluation, and the importance of remaining in the ED until their evaluation is complete.  Upon chart review, patient just had a CBC done this morning so we will defer that now.  Will obtain CMP Patient will need surgery consult   Philip [name] T, PA-C 03/07/21 1330

## 2021-03-07 NOTE — Anesthesia Preprocedure Evaluation (Addendum)
Anesthesia Evaluation  Patient identified by MRN, date of birth, ID band Patient awake    Reviewed: Allergy & Precautions, NPO status , Patient's Chart, lab work & pertinent test results  Airway Mallampati: II  TM Distance: >3 FB Neck ROM: Full    Dental no notable dental hx. (+) Teeth Intact, Dental Advisory Given   Pulmonary neg pulmonary ROS,    Pulmonary exam normal breath sounds clear to auscultation       Cardiovascular hypertension, Pt. on medications Normal cardiovascular exam Rhythm:Regular Rate:Normal     Neuro/Psych negative neurological ROS  negative psych ROS   GI/Hepatic Neg liver ROS, GERD  Controlled,  Endo/Other  negative endocrine ROSK 2.3, currently being repleted, EKG wnl, no symptoms  Renal/GU negative Renal ROS   Prostate CA    Musculoskeletal negative musculoskeletal ROS (+)   Abdominal   Peds  Hematology negative hematology ROS (+)   Anesthesia Other Findings Sigmoid volvulus  Reproductive/Obstetrics                            Anesthesia Physical Anesthesia Plan  ASA: 3  Anesthesia Plan: MAC   Post-op Pain Management:    Induction: Intravenous  PONV Risk Score and Plan: 1 and Propofol infusion and Treatment may vary due to age or medical condition  Airway Management Planned: Natural Airway  Additional Equipment:   Intra-op Plan:   Post-operative Plan:   Informed Consent: I have reviewed the patients History and Physical, chart, labs and discussed the procedure including the risks, benefits and alternatives for the proposed anesthesia with the patient or authorized representative who has indicated his/her understanding and acceptance.     Dental advisory given  Plan Discussed with: CRNA  Anesthesia Plan Comments:         Anesthesia Quick Evaluation

## 2021-03-07 NOTE — Consult Note (Signed)
Consult Note  Philip [name] [birth year]  [id number].    Requesting MD: Dr. Kathrynn Humble  Chief Complaint/Reason for Consult: sigmoid volvulus  HPI:  76 year old male with medical history significant for prostate cancer s/p orchiectomy, HTN who presented to the [facility] ED at the recommendation of his PCP due to abnormal CT scan findings. He has had worsening bloating and constipation for the last 4 days as well as some mild nausea. No emesis. Last BM 4 days ago and has daily Bms at baseline. He has had some reflux symptoms with occasional cough. He has still been eating and drinking with last meal this morning for breakfast. He denies fever, chills. ROS otherwise as below.  CT scan shows sigmoid volvulus. Other significant findings include normal WBC, Hgb/Hct 12.8/38.1. General surgery was asked to see in regard to his volvulus  Substance use: occasional alcohol use, never smoker Blood thinners: none Past Surgeries: no prior abdominal surgeries   ROS: Review of Systems  Constitutional:  Negative for chills and fever.  Respiratory:  Positive for cough. Negative for wheezing.   Cardiovascular:  Negative for chest pain, palpitations and leg swelling.  Gastrointestinal:  Positive for abdominal pain, constipation and nausea. Negative for diarrhea and vomiting.  Genitourinary: Negative.    Family History  Problem Relation Age of Onset   CVA Mother    CAD Father    Osteoarthritis Brother     Past Medical History:  Diagnosis Date   Cancer [facility])    PROSTATE   Edema leg Sept 28, 2016   left leg from foot to thigh, increasinly worse over last 4 weeks   Hypertension    Hypoglycemia    Swelling LAST 30 DAYS DR [facility] AWARE   LEFT LEG AND FOOT    Past Surgical History:  Procedure Laterality Date   GUM SURGERY     TEETH IMPLANTS ALSO   ORCHIECTOMY Bilateral 01/03/2015   Procedure: ORCHIECTOMY;  Surgeon: Alexis Frock, MD;  Location: [facility] ORS;  Service: Urology;  Laterality: Bilateral;     Social History:  reports that he has never smoked. He has never used smokeless tobacco. He reports current alcohol use of about 1.0 standard drink per week. He reports that he does not use drugs.  Allergies:  Allergies  Allergen Reactions   Prednisone Anxiety    (Not in a hospital admission)   Blood pressure (!) 150/112, pulse (!) 105, temperature 97.9 F (36.6 C), temperature source Oral, resp. rate 16, height 6\' 4"  (1.93 m), weight 90.3 kg, SpO2 98 %. Physical Exam: General: pleasant, WD, male who is sitting in bed in NAD HEENT: head is normocephalic, atraumatic.  Sclera are noninjected.  Pupils equal and round. EOMs intact.  Ears and nose without any masses or lesions.  Mouth is pink and moist Heart: regular, rate, and rhythm.  Normal s1,s2. No obvious murmurs, gallops, or rubs noted.  Palpable radial and pedal pulses bilaterally Lungs: CTAB, no wheezes, rhonchi, or rales noted.  Respiratory effort nonlabored Abd: hypertonic BS. Abdomen very distended but soft. Mild to moderate TTP across lower abdomen without rebound or guarding MSK: all 4 extremities are symmetrical with no cyanosis, clubbing, or edema. No calf TTP bilaterally Skin: warm and dry with no masses, lesions, or rashes Neuro: Cranial nerves 2-12 grossly intact, sensation is normal throughout Psych: A&Ox3 with an appropriate affect.    Results for orders placed or performed in visit on 03/07/21 (from the past 48 hour(s))  CBC with Differential/Platelet  Status: Abnormal   Collection Time: 03/07/21  9:15 AM  Result Value Ref Range   WBC 8.1 4.0 - 10.5 K/uL   RBC 4.15 (L) 4.22 - 5.81 Mil/uL   Hemoglobin 12.8 (L) 13.0 - 17.0 g/dL   HCT 38.1 (L) 39.0 - 52.0 %   MCV 91.8 78.0 - 100.0 fl   MCHC 33.6 30.0 - 36.0 g/dL   RDW 12.8 11.5 - 15.5 %   Platelets 242.0 150.0 - 400.0 K/uL   Neutrophils Relative % 69.8 43.0 - 77.0 %   Lymphocytes Relative 17.5 12.0 - 46.0 %   Monocytes Relative 11.4 3.0 - 12.0 %    Eosinophils Relative 1.0 0.0 - 5.0 %   Basophils Relative 0.3 0.0 - 3.0 %   Neutro Abs 5.7 1.4 - 7.7 K/uL   Lymphs Abs 1.4 0.7 - 4.0 K/uL   Monocytes Absolute 0.9 0.1 - 1.0 K/uL   Eosinophils Absolute 0.1 0.0 - 0.7 K/uL   Basophils Absolute 0.0 0.0 - 0.1 K/uL  Lipase     Status: None   Collection Time: 03/07/21  9:15 AM  Result Value Ref Range   Lipase 25.0 11.0 - 59.0 U/L  Amylase     Status: None   Collection Time: 03/07/21  9:15 AM  Result Value Ref Range   Amylase 42 27 - 131 U/L  Magnesium     Status: None   Collection Time: 03/07/21  9:15 AM  Result Value Ref Range   Magnesium 1.9 1.5 - 2.5 mg/dL  TSH     Status: None   Collection Time: 03/07/21  9:15 AM  Result Value Ref Range   TSH 1.87 0.35 - 5.50 uIU/mL  Folate     Status: None   Collection Time: 03/07/21  9:15 AM  Result Value Ref Range   Folate >24.2 >5.9 ng/mL  IBC + Ferritin     Status: Abnormal   Collection Time: 03/07/21  9:15 AM  Result Value Ref Range   Iron 30 (L) 42 - 165 ug/dL   Transferrin 268.0 212.0 - 360.0 mg/dL   Saturation Ratios 8.0 (L) 20.0 - 50.0 %   Ferritin 165.1 22.0 - 322.0 ng/mL   TIBC 375.2 250.0 - 450.0 mcg/dL  Vitamin B12     Status: None   Collection Time: 03/07/21  9:15 AM  Result Value Ref Range   Vitamin B-12 528 211 - 911 pg/mL   CT Abdomen Pelvis W Contrast  Result Date: 03/07/2021 CLINICAL DATA:  Abdominal pain EXAM: CT ABDOMEN AND PELVIS WITH CONTRAST TECHNIQUE: Multidetector CT imaging of the abdomen and pelvis was performed using the standard protocol following bolus administration of intravenous contrast. CONTRAST:  171mL ISOVUE-300 IOPAMIDOL (ISOVUE-300) INJECTION 61% COMPARISON:  02/01/2020 FINDINGS: Lower chest: Subtle increased markings are seen in the left lower lung fields. There is elevation of left hemidiaphragm. Hepatobiliary: Liver measures 19.7 cm in length. There are few low-density lesions in the liver each measuring less than 1.3 cm with no significant change,  possibly suggesting cysts or hemangiomas. In image 20 of series 2, there is an ill-defined 2.1 cm area of low attenuation in the right lobe of liver. Gallbladder is unremarkable. Pancreas: No focal abnormality is seen. Spleen: Spleen measures 13.4 cm in maximum diameter. Adrenals/Urinary Tract: There is mild nodularity in the adrenals. There is no hydronephrosis. There are no renal or ureteral stones. There is 2 cm smooth marginated low-density lesion in the upper pole of left kidney suggesting renal cysts. Urinary bladder is  unremarkable. Stomach/Bowel: Small hiatal hernia is seen. Stomach is not distended. Small bowel loops are not dilated. Appendix is not dilated. There is no significant wall thickening in the colon. There is swirling of mesenteric vessels in the lower abdomen. There is abnormal dilation of portion of sigmoid colon. There is incomplete distention of rectosigmoid. Vascular/Lymphatic: There are scattered atherosclerotic plaques and calcifications. No significant lymphadenopathy seen. Reproductive: Unremarkable. Other: There is no pneumoperitoneum. There is small amount of free fluid in the pelvic cavity, possibly related to acute sigmoid volvulus. Small umbilical hernia containing fat is seen. Musculoskeletal: Degenerative changes are noted in the lumbar spine, particularly severe at L4-L5 level with spinal stenosis and encroachment of neural foramina. There are patchy areas of sclerosis in the vertebral bodies, possibly due to disc degeneration. IMPRESSION: There is sigmoid volvulus. Surgical consultation should be considered. There is no significant small bowel obstruction. There is no ascites or pneumoperitoneum. There is no hydronephrosis. Appendix is not dilated. Small hiatal hernia. There are few low-density foci in the liver, possibly cysts or hemangiomas. There is an ill-defined 2.1 cm low-attenuation in the right lobe of liver which may be beam hardening artifact or space-occupying lesion.  Short-term follow-up multiphasic CT may be considered. Enlarged liver and spleen. Imaging finding of sigmoid volvulus was relayed to Dr. Ronnald Ramp by telephone call. Electronically Signed   By: Elmer Picker M.D.   On: 03/07/2021 12:06   DG ABD ACUTE 2+V W 1V CHEST  Addendum Date: 03/07/2021   ADDENDUM REPORT: 03/07/2021 09:46 ADDENDUM: These results were called by telephone at the time of interpretation on 03/07/2021 at 9:46 am to provider Scarlette Calico , who verbally acknowledged these results. Electronically Signed   By: Markus Daft M.D.   On: 03/07/2021 09:46   Result Date: 03/07/2021 CLINICAL DATA:  Generalized abdominal tenderness without rebound tenderness. Severe constipation. EXAM: DG ABDOMEN ACUTE WITH 1 VIEW CHEST COMPARISON:  PET-CT 01/31/2021 and chest radiograph 12/27/2019 and CT abdomen 02/01/2020 FINDINGS: Stable position of the right jugular Port-A-Cath with the tip near the superior cavoatrial junction. Heart size is normal. Lungs are clear. Large dilated bowel loop in the mid upper abdomen. The configuration raises concern for a potential sigmoid volvulus. There is stool throughout the transverse and right colon. No evidence for free air. Patchy areas of sclerosis throughout the pelvic bones compatible with osseous metastatic disease. IMPRESSION: 1. Large dilated loop of bowel in the upper midline of the abdomen. Review of prior CT imaging demonstrates sigmoid colon in this region. Configuration of the dilated sigmoid colon raises concern for a possible volvulus. Recommend further characterization with a CT of the abdomen and pelvis. 2. Moderate to large amount of stool in the abdomen. 3. No acute chest abnormality. 4. Osseous metastatic disease. Electronically Signed: By: Markus Daft M.D. On: 03/07/2021 09:36      Assessment/Plan Sigmoid volvulus  - CT 1/5 with sigmoid volvulus without SBO, ascites, or free air - afebrile, WBC normal, hypertensive - was tolerating a diet prior to  presentation without emesis and do not think NGT is necessary at this time No peritonitis or concerning signs/symptoms of bowel compromise, recommend GI consultation for possible endoscopic decompression - we will continue to follow  FEN: NPO ID: none VTE: chemical prophylaxis okay from general surgery perspective  High Medical Decision Making - relevant imaging, tests and outside notes personally reviewed and case discussed with EDP.  Winferd Humphrey, [facility] Surgery 03/07/2021, 2:53 PM Please see Amion for pager number  during day hours 7:00am-4:30pm

## 2021-03-07 NOTE — H&P (Signed)
History and Physical    Philip [name] WUJ:[id number] DOB: Jan 18, 1946 DOA: 03/07/2021  PCP: [name], MD  Patient coming from: Home  Chief Complaint: constipation  HPI: Philip [name] is a 76 y.o. male with medical history significant of HTN, HLD, prostate CA. Presenting with constipation. He reports that he's had constipation for the last 4 days. He has noticed swelling and epigastric abdominal pain over the last 2 days. The pain is a crampy spasm that is followed by reflux symptoms. He has had decreased appetite over last night and this morning. He's had nausea but no vomiting. He did not try any medications. He decided to see his PCP today. A CT of the abdomen was obtained. It showed volvulus. He was then referred to the ED for evaluation. He denies any other aggravating or alleviating factors.    ED Course: Imaging showed volvulus. General surgery was consulted. They recommended GI consulted. [facility] was consulted. [facility] was called for admission.   Review of Systems:  Denies CP, palpitations, lightheadedness, dizziness, fevers, diarrhea. Review of systems is otherwise negative for all not mentioned in HPI.   PMHx Past Medical History:  Diagnosis Date   Cancer [facility])    PROSTATE   Edema leg Sept 28, 2016   left leg from foot to thigh, increasinly worse over last 4 weeks   Hypertension    Hypoglycemia    Swelling LAST 30 DAYS DR [facility] AWARE   LEFT LEG AND FOOT    PSHx Past Surgical History:  Procedure Laterality Date   GUM SURGERY     TEETH IMPLANTS ALSO   ORCHIECTOMY Bilateral 01/03/2015   Procedure: ORCHIECTOMY;  Surgeon: Alexis Frock, MD;  Location: [facility];  Service: Urology;  Laterality: Bilateral;    SocHx  reports that he has never smoked. He has never used smokeless tobacco. He reports current alcohol use of about 1.0 standard drink per week. He reports that he does not use drugs.  Allergies  Allergen Reactions   Prednisone Anxiety    FamHx Family History  Problem  Relation Age of Onset   CVA Mother    CAD Father    Osteoarthritis Brother     Prior to Admission medications   Medication Sig Start Date End Date Taking? Authorizing Provider  abiraterone acetate (ZYTIGA) 250 MG tablet TAKE 4 TABLETS BY MOUTH ONCE DAILY AS DIRECTED.  TAKE 1 HOUR BEFORE OR 2 HOURS AFTER A MEAL 02/19/21  Yes [name], MD  atorvastatin (LIPITOR) 10 MG tablet TAKE 1 TABLET BY MOUTH EVERY DAY 02/07/21  Yes [name], MD  BIOTIN 5000 PO Take 1 tablet by mouth daily. 02/02/15  Yes [provider]  CALCIUM-VITAMIN D PO Take 1 tablet by mouth daily.   Yes [provider]  fexofenadine (ALLEGRA) 180 MG tablet Take 180 mg by mouth daily.   Yes [provider]  hydrOXYzine (ATARAX/VISTARIL) 10 MG tablet Take 10 mg by mouth in the morning and at bedtime. 03/31/18  Yes [provider]  indapamide (LOZOL) 1.25 MG tablet TAKE 1 TABLET BY MOUTH EVERY DAY 01/30/21  Yes [name], MD  KLOR-CON M20 20 MEQ tablet TAKE 1 TABLET BY MOUTH TWICE A DAY 12/31/20  Yes [name], MD  lidocaine-prilocaine (EMLA) cream Apply 1 application topically as needed. Patient taking differently: Apply 1 application topically as needed (access port). 12/27/18  Yes Shadad, Mathis Dad, MD  LORazepam (ATIVAN) 1 MG tablet TAKE 1 TABLET BY MOUTH EVERY 8 HOURS  AS NEEDED FOR ANXIETY Patient taking differently: Take 1 mg by mouth 2 (two) times daily. 02/27/21  Yes [name], MD  Multiple Vitamin (MULTIVITAMIN) tablet Take 1 tablet by mouth daily.   Yes [provider]  olmesartan (BENICAR) 20 MG tablet Take 1 tablet (20 mg total) by mouth daily. 06/12/20  Yes [name], MD  traMADol (ULTRAM) 50 MG tablet Take 1 tablet (50 mg total) by mouth every 12 (twelve) hours as needed for moderate pain. 11/12/20  Yes [name], MD  Denosumab (XGEVA [place]) Inject into the skin.    [provider]  azelastine (ASTELIN) 0.1 % nasal spray PLACE 1 SPRAY  INTO BOTH NOSTRILS 2 (TWO) TIMES DAILY. USE IN [facility] NOSTRIL AS DIRECTED 09/26/18 07/30/19  [name], MD    Physical Exam: Vitals:   03/07/21 1310 03/07/21 1314 03/07/21 1500  BP: (!) 150/112  (!) 140/91  Pulse: (!) 105  90  Resp: 16  19  Temp: 97.9 F (36.6 C)    TempSrc: Oral    SpO2: 98%  100%  Weight:  90.3 kg   Height:  6\' 4"  (1.93 m)     General: 76 y.o. male resting in bed in NAD Eyes: PERRL, normal sclera ENMT: Nares patent w/o discharge, orophaynx clear, dentition normal, ears w/o discharge/lesions/ulcers Neck: Supple, trachea midline Cardiovascular: RRR, +S1, S2, no m/g/r, equal pulses throughout Respiratory: CTABL, no w/r/r, normal WOB GI: BS absent, mild distention but soft, TTP epigastric and suprapubic MSK: No e/c/c Neuro: A&O x 3, no focal deficits Psyc: Appropriate interaction and affect, calm/cooperative  Labs on Admission: I have personally reviewed following labs and imaging studies  CBC: Recent Labs  Lab 03/07/21 0915  WBC 8.1  NEUTROABS 5.7  HGB 12.8*  HCT 38.1*  MCV 91.8  PLT 585.2   Basic Metabolic Panel: Recent Labs  Lab 03/07/21 0915 03/07/21 1444  NA  --  136  K  --  2.3*  CL  --  104  CO2  --  25  GLUCOSE  --  117*  BUN  --  18  CREATININE  --  0.71  CALCIUM  --  9.2  MG 1.9  --    GFR: Estimated Creatinine Clearance: 96.4 mL/min (by C-G formula based on SCr of 0.71 mg/dL). Liver Function Tests: Recent Labs  Lab 03/07/21 1444  AST 21  ALT 19  ALKPHOS 54  BILITOT 1.1  PROT 7.0  ALBUMIN 4.0   Recent Labs  Lab 03/07/21 0915  LIPASE 25.0  AMYLASE 42   No results for input(s): AMMONIA in the last 168 hours. Coagulation Profile: No results for input(s): INR, PROTIME in the last 168 hours. Cardiac Enzymes: No results for input(s): CKTOTAL, CKMB, CKMBINDEX, TROPONINI in the last 168 hours. BNP (last 3 results) No results for input(s): PROBNP in the last 8760 hours. HbA1C: No results for input(s): HGBA1C in the  last 72 hours. CBG: No results for input(s): GLUCAP in the last 168 hours. Lipid Profile: No results for input(s): CHOL, HDL, LDLCALC, TRIG, CHOLHDL, LDLDIRECT in the last 72 hours. Thyroid Function Tests: Recent Labs    03/07/21 0915  TSH 1.87   Anemia Panel: Recent Labs    03/07/21 0915  VITAMINB12 528  FOLATE >24.2  FERRITIN 165.1  TIBC 375.2  IRON 30*   Urine analysis: No results found for: COLORURINE, APPEARANCEUR, LABSPEC, PHURINE, GLUCOSEU, HGBUR, BILIRUBINUR, KETONESUR, PROTEINUR, UROBILINOGEN, NITRITE, LEUKOCYTESUR  Radiological Exams on Admission: CT Abdomen Pelvis  W Contrast  Result Date: 03/07/2021 CLINICAL DATA:  Abdominal pain EXAM: CT ABDOMEN AND PELVIS WITH CONTRAST TECHNIQUE: Multidetector CT imaging of the abdomen and pelvis was performed using the standard protocol following bolus administration of intravenous contrast. CONTRAST:  170mL ISOVUE-300 IOPAMIDOL (ISOVUE-300) INJECTION 61% COMPARISON:  02/01/2020 FINDINGS: Lower chest: Subtle increased markings are seen in the left lower lung fields. There is elevation of left hemidiaphragm. Hepatobiliary: Liver measures 19.7 cm in length. There are few low-density lesions in the liver each measuring less than 1.3 cm with no significant change, possibly suggesting cysts or hemangiomas. In image 20 of series 2, there is an ill-defined 2.1 cm area of low attenuation in the right lobe of liver. Gallbladder is unremarkable. Pancreas: No focal abnormality is seen. Spleen: Spleen measures 13.4 cm in maximum diameter. Adrenals/Urinary Tract: There is mild nodularity in the adrenals. There is no hydronephrosis. There are no renal or ureteral stones. There is 2 cm smooth marginated low-density lesion in the upper pole of left kidney suggesting renal cysts. Urinary bladder is unremarkable. Stomach/Bowel: Small hiatal hernia is seen. Stomach is not distended. Small bowel loops are not dilated. Appendix is not dilated. There is no  significant wall thickening in the colon. There is swirling of mesenteric vessels in the lower abdomen. There is abnormal dilation of portion of sigmoid colon. There is incomplete distention of rectosigmoid. Vascular/Lymphatic: There are scattered atherosclerotic plaques and calcifications. No significant lymphadenopathy seen. Reproductive: Unremarkable. Other: There is no pneumoperitoneum. There is small amount of free fluid in the pelvic cavity, possibly related to acute sigmoid volvulus. Small umbilical hernia containing fat is seen. Musculoskeletal: Degenerative changes are noted in the lumbar spine, particularly severe at L4-L5 level with spinal stenosis and encroachment of neural foramina. There are patchy areas of sclerosis in the vertebral bodies, possibly due to disc degeneration. IMPRESSION: There is sigmoid volvulus. Surgical consultation should be considered. There is no significant small bowel obstruction. There is no ascites or pneumoperitoneum. There is no hydronephrosis. Appendix is not dilated. Small hiatal hernia. There are few low-density foci in the liver, possibly cysts or hemangiomas. There is an ill-defined 2.1 cm low-attenuation in the right lobe of liver which may be beam hardening artifact or space-occupying lesion. Short-term follow-up multiphasic CT may be considered. Enlarged liver and spleen. Imaging finding of sigmoid volvulus was relayed to Dr. Ronnald Ramp by telephone call. Electronically Signed   By: Elmer Picker M.D.   On: 03/07/2021 12:06   DG ABD ACUTE 2+V W 1V CHEST  Addendum Date: 03/07/2021   ADDENDUM REPORT: 03/07/2021 09:46 ADDENDUM: These results were called by telephone at the time of interpretation on 03/07/2021 at 9:46 am to provider Scarlette Calico , who verbally acknowledged these results. Electronically Signed   By: Markus Daft M.D.   On: 03/07/2021 09:46   Result Date: 03/07/2021 CLINICAL DATA:  Generalized abdominal tenderness without rebound tenderness. Severe  constipation. EXAM: DG ABDOMEN ACUTE WITH 1 VIEW CHEST COMPARISON:  PET-CT 01/31/2021 and chest radiograph 12/27/2019 and CT abdomen 02/01/2020 FINDINGS: Stable position of the right jugular Port-A-Cath with the tip near the superior cavoatrial junction. Heart size is normal. Lungs are clear. Large dilated bowel loop in the mid upper abdomen. The configuration raises concern for a potential sigmoid volvulus. There is stool throughout the transverse and right colon. No evidence for free air. Patchy areas of sclerosis throughout the pelvic bones compatible with osseous metastatic disease. IMPRESSION: 1. Large dilated loop of bowel in the upper midline of the  abdomen. Review of prior CT imaging demonstrates sigmoid colon in this region. Configuration of the dilated sigmoid colon raises concern for a possible volvulus. Recommend further characterization with a CT of the abdomen and pelvis. 2. Moderate to large amount of stool in the abdomen. 3. No acute chest abnormality. 4. Osseous metastatic disease. Electronically Signed: By: Markus Daft M.D. On: 03/07/2021 09:36    EKG: None obtained in ED.   Assessment/Plan Volvulus     - admit to inpt, progressive     - [facility] to take to GI lab for flex sig     - NPO     - IVF  Hypokalemia     - replace K+; check Mg2+     - check EKG     - rpt renal function panel at 2000 hrs  HTN     - NPO for now; resume home regimen when off NPO status     - PRN hydralazine  Prostate CA     - continue follow up with onco  Normocytic anemia     - no evidence of bleed, follow  HLD     - resume home regimen when off NPO status  Anxiety     - resume home regimen when off NPO status  DVT prophylaxis: SCDs  Code Status: FULL  Family Communication: w/ wife at bedside  Consults called: EDP spoke with [facility] and [facility]   Status is: Inpatient  Remains inpatient appropriate because: severity of illness  Jonnie Finner DO [facility]  If 7PM-7AM, please contact  night-coverage [email or web address]  03/07/2021, 3:47 PM

## 2021-03-07 NOTE — Telephone Encounter (Signed)
Patient informing provider he received his recommendation message advising him to go to the ed  Patient informing provider he will go to [place]  Patient states if provider wants him to go to [facility] hospital, please let him know

## 2021-03-07 NOTE — Progress Notes (Signed)
Subjective:  Patient ID: Philip [name], male    DOB: [birth year]  Age: 76 y.o. MRN: [id number]  CC: Anemia and Hypertension  This visit occurred during the SARS-CoV-2 public health emergency.  Safety protocols were in place, including screening questions prior to the visit, additional usage of staff PPE, and extensive cleaning of exam room while observing appropriate contact time as indicated for disinfecting solutions.    HPI Daevon Holdren presents for f/up -   He complains of a 4-day history of diffuse abdominal pain, nausea, loss of appetite, and no bowel movements.  He has also had some heartburn but he denies vomiting, odynophagia, or dysphagia.  Outpatient Medications Prior to Visit  Medication Sig Dispense Refill   abiraterone acetate (ZYTIGA) 250 MG tablet TAKE 4 TABLETS BY MOUTH ONCE DAILY AS DIRECTED.  TAKE 1 HOUR BEFORE OR 2 HOURS AFTER A MEAL 120 tablet 10   atorvastatin (LIPITOR) 10 MG tablet TAKE 1 TABLET BY MOUTH EVERY DAY 90 tablet 1   BIOTIN 5000 PO Take 1 tablet by mouth daily.     Denosumab (XGEVA ) Inject into the skin.     fexofenadine (ALLEGRA) 180 MG tablet Take 180 mg by mouth daily.     hydrOXYzine (ATARAX/VISTARIL) 10 MG tablet 1 TABLET BY MOUTH EVERY 8 HOURS AS NEEDED     indapamide (LOZOL) 1.25 MG tablet TAKE 1 TABLET BY MOUTH EVERY DAY 90 tablet 1   KLOR-CON M20 20 MEQ tablet TAKE 1 TABLET BY MOUTH TWICE A DAY 180 tablet 0   lidocaine-prilocaine (EMLA) cream Apply 1 application topically as needed. 30 g 0   LORazepam (ATIVAN) 1 MG tablet TAKE 1 TABLET BY MOUTH EVERY 8 HOURS AS NEEDED FOR ANXIETY 90 tablet 0   Multiple Vitamin (MULTIVITAMIN) tablet Take 1 tablet by mouth daily.     olmesartan (BENICAR) 20 MG tablet Take 1 tablet (20 mg total) by mouth daily. 90 tablet 1   traMADol (ULTRAM) 50 MG tablet Take 1 tablet (50 mg total) by mouth every 12 (twelve) hours as needed for moderate pain. 180 tablet 1   aspirin EC 81 MG tablet Take 81 mg by mouth daily.      meloxicam (MOBIC) 7.5 MG tablet TAKE 1 TABLET BY MOUTH EVERY DAY 90 tablet 0   LORazepam (ATIVAN) 1 MG tablet Take 1 tablet (1 mg total) by mouth every 8 (eight) hours as needed. for anxiety 30 tablet 0   No facility-administered medications prior to visit.    ROS Review of Systems  Constitutional:  Positive for appetite change. Negative for diaphoresis, fatigue and fever.  HENT: Negative.  Negative for trouble swallowing and voice change.   Respiratory:  Negative for cough, chest tightness, shortness of breath and wheezing.   Gastrointestinal:  Positive for abdominal pain, constipation and nausea. Negative for blood in stool, diarrhea, rectal pain and vomiting.  Genitourinary: Negative.  Negative for difficulty urinating.  Musculoskeletal: Negative.   Skin: Negative.   Neurological: Negative.  Negative for dizziness, weakness and light-headedness.  Hematological:  Negative for adenopathy. Does not bruise/bleed easily.  Psychiatric/Behavioral: Negative.     Objective:  BP (!) 146/88 (BP Location: Left Arm, Patient Position: Sitting, Cuff Size: Large)    Pulse (!) 105    Temp 98.1 F (36.7 C) (Oral)    Ht 6\' 4"  (1.93 m)    Wt 199 lb (90.3 kg)    SpO2 97%    BMI 24.22 kg/m   BP Readings from Last 3 Encounters:  03/07/21 (!) 146/88  02/07/21 (!) 149/83  01/18/21 (!) 157/89    Wt Readings from Last 3 Encounters:  03/07/21 199 lb (90.3 kg)  02/07/21 199 lb 1.6 oz (90.3 kg)  11/23/20 195 lb 4.8 oz (88.6 kg)    Physical Exam Vitals reviewed.  Constitutional:      General: He is not in acute distress.    Appearance: Normal appearance. He is not ill-appearing, toxic-appearing or diaphoretic.  HENT:     Nose: Nose normal.     Mouth/Throat:     Mouth: Mucous membranes are moist.  Eyes:     General: No scleral icterus.    Conjunctiva/sclera: Conjunctivae normal.  Cardiovascular:     Rate and Rhythm: Normal rate and regular rhythm.     Heart sounds: No murmur heard. Pulmonary:      Effort: Pulmonary effort is normal.     Breath sounds: No stridor. No wheezing, rhonchi or rales.  Abdominal:     General: Bowel sounds are decreased. There is distension.     Palpations: There is no hepatomegaly, splenomegaly or mass.     Tenderness: There is generalized abdominal tenderness. There is no guarding or rebound.  Genitourinary:    Prostate: Normal. Not enlarged, not tender and no nodules present.     Rectum: Normal. Guaiac result negative. No mass, tenderness, anal fissure, external hemorrhoid or internal hemorrhoid. Normal anal tone.  Musculoskeletal:        General: No tenderness. Normal range of motion.     Cervical back: Neck supple.     Right lower leg: No edema.     Left lower leg: No edema.  Lymphadenopathy:     Cervical: No cervical adenopathy.  Skin:    General: Skin is warm and dry.     Findings: No rash.  Neurological:     General: No focal deficit present.     Mental Status: He is alert. Mental status is at baseline.  Psychiatric:        Mood and Affect: Mood normal.    Lab Results  Component Value Date   WBC 8.1 03/07/2021   HGB 12.8 (L) 03/07/2021   HCT 38.1 (L) 03/07/2021   PLT 242.0 03/07/2021   GLUCOSE 99 02/05/2021   CHOL 181 05/09/2020   TRIG 220.0 (H) 05/09/2020   HDL 72.70 05/09/2020   LDLDIRECT 75.0 05/09/2020   ALT 16 02/05/2021   AST 19 02/05/2021   NA 135 02/05/2021   K 3.9 02/05/2021   CL 103 02/05/2021   CREATININE 1.15 02/05/2021   BUN 20 02/05/2021   CO2 23 02/05/2021   TSH 1.87 03/07/2021   PSA 0.02 (L) 05/09/2020   INR 1.06 02/09/2015    NM PET (PSMA) SKULL TO MID THIGH  Result Date: 02/01/2021 CLINICAL DATA:  Prostate carcinoma with biochemical recurrence. EXAM: NUCLEAR MEDICINE PET SKULL BASE TO THIGH TECHNIQUE: 9.2 mCi F18 Piflufolastat (Pylarify) was injected intravenously. Full-ring PET imaging was performed from the skull base to thigh after the radiotracer. CT data was obtained and used for attenuation  correction and anatomic localization. COMPARISON:  MDP bone scan 01/31/2020, CT 02/01/2020 FINDINGS: NECK No radiotracer activity in neck lymph nodes. Incidental CT finding: None CHEST No radiotracer accumulation within mediastinal or hilar lymph nodes. No suspicious pulmonary nodules on the CT scan. Incidental CT finding: None ABDOMEN/PELVIS Prostate: No focal activity in the prostate gland. Lymph nodes: Radiotracer activity associated with a small LEFT operator node measuring 8 mm (image 208/series 4) with significant  radiotracer activity for size with SUV max 5.5. RIGHT external iliac node measuring 6 mm (image 205) with SUV max equal 3.9. Very small RIGHT operator node measuring 4 mm (image 208/4 with with SUV max equal 2.7. No radiotracer avid lymph nodes outside the pelvis. Liver: No evidence of liver metastasis Incidental CT finding: Atherosclerotic calcification of the aorta. SKELETON Multiple sclerotic lesions present within the pelvis, sacrum and spine. Sclerotic rib lesions and clavicle lesions additionally. These multifocal sclerotic lesions do not have associated radiotracer activity. IMPRESSION: 1. Several (3) small lymph nodes along the external iliac chains have significant radiotracer activity and are concerning but indeterminate for local prostate cancer nodal metastasis. 2. No evidence of metastatic adenopathy outside the pelvis. 3. No evidence of visceral metastasis. 4. Multiple sclerotic lesions throughout the skeleton consistent prostate skeletal metastasis. The lesions do NOT have radiotracer activity therefore favored treated/dormant metastasis. Electronically Signed   By: Suzy Bouchard M.D.   On: 02/01/2021 15:24   CT Abdomen Pelvis W Contrast  Result Date: 03/07/2021 CLINICAL DATA:  Abdominal pain EXAM: CT ABDOMEN AND PELVIS WITH CONTRAST TECHNIQUE: Multidetector CT imaging of the abdomen and pelvis was performed using the standard protocol following bolus administration of  intravenous contrast. CONTRAST:  150mL ISOVUE-300 IOPAMIDOL (ISOVUE-300) INJECTION 61% COMPARISON:  02/01/2020 FINDINGS: Lower chest: Subtle increased markings are seen in the left lower lung fields. There is elevation of left hemidiaphragm. Hepatobiliary: Liver measures 19.7 cm in length. There are few low-density lesions in the liver each measuring less than 1.3 cm with no significant change, possibly suggesting cysts or hemangiomas. In image 20 of series 2, there is an ill-defined 2.1 cm area of low attenuation in the right lobe of liver. Gallbladder is unremarkable. Pancreas: No focal abnormality is seen. Spleen: Spleen measures 13.4 cm in maximum diameter. Adrenals/Urinary Tract: There is mild nodularity in the adrenals. There is no hydronephrosis. There are no renal or ureteral stones. There is 2 cm smooth marginated low-density lesion in the upper pole of left kidney suggesting renal cysts. Urinary bladder is unremarkable. Stomach/Bowel: Small hiatal hernia is seen. Stomach is not distended. Small bowel loops are not dilated. Appendix is not dilated. There is no significant wall thickening in the colon. There is swirling of mesenteric vessels in the lower abdomen. There is abnormal dilation of portion of sigmoid colon. There is incomplete distention of rectosigmoid. Vascular/Lymphatic: There are scattered atherosclerotic plaques and calcifications. No significant lymphadenopathy seen. Reproductive: Unremarkable. Other: There is no pneumoperitoneum. There is small amount of free fluid in the pelvic cavity, possibly related to acute sigmoid volvulus. Small umbilical hernia containing fat is seen. Musculoskeletal: Degenerative changes are noted in the lumbar spine, particularly severe at L4-L5 level with spinal stenosis and encroachment of neural foramina. There are patchy areas of sclerosis in the vertebral bodies, possibly due to disc degeneration. IMPRESSION: There is sigmoid volvulus. Surgical consultation  should be considered. There is no significant small bowel obstruction. There is no ascites or pneumoperitoneum. There is no hydronephrosis. Appendix is not dilated. Small hiatal hernia. There are few low-density foci in the liver, possibly cysts or hemangiomas. There is an ill-defined 2.1 cm low-attenuation in the right lobe of liver which may be beam hardening artifact or space-occupying lesion. Short-term follow-up multiphasic CT may be considered. Enlarged liver and spleen. Imaging finding of sigmoid volvulus was relayed to Dr. Ronnald Ramp by telephone call. Electronically Signed   By: Elmer Picker M.D.   On: 03/07/2021 12:06   DG ABD ACUTE 2+V  W 1V CHEST  Addendum Date: 03/07/2021   ADDENDUM REPORT: 03/07/2021 09:46 ADDENDUM: These results were called by telephone at the time of interpretation on 03/07/2021 at 9:46 am to provider Scarlette Calico , who verbally acknowledged these results. Electronically Signed   By: Markus Daft M.D.   On: 03/07/2021 09:46   Result Date: 03/07/2021 CLINICAL DATA:  Generalized abdominal tenderness without rebound tenderness. Severe constipation. EXAM: DG ABDOMEN ACUTE WITH 1 VIEW CHEST COMPARISON:  PET-CT 01/31/2021 and chest radiograph 12/27/2019 and CT abdomen 02/01/2020 FINDINGS: Stable position of the right jugular Port-A-Cath with the tip near the superior cavoatrial junction. Heart size is normal. Lungs are clear. Large dilated bowel loop in the mid upper abdomen. The configuration raises concern for a potential sigmoid volvulus. There is stool throughout the transverse and right colon. No evidence for free air. Patchy areas of sclerosis throughout the pelvic bones compatible with osseous metastatic disease. IMPRESSION: 1. Large dilated loop of bowel in the upper midline of the abdomen. Review of prior CT imaging demonstrates sigmoid colon in this region. Configuration of the dilated sigmoid colon raises concern for a possible volvulus. Recommend further characterization with  a CT of the abdomen and pelvis. 2. Moderate to large amount of stool in the abdomen. 3. No acute chest abnormality. 4. Osseous metastatic disease. Electronically Signed: By: Markus Daft M.D. On: 03/07/2021 09:36     CT Abdomen Pelvis W Contrast  Result Date: 03/07/2021 CLINICAL DATA:  Abdominal pain EXAM: CT ABDOMEN AND PELVIS WITH CONTRAST TECHNIQUE: Multidetector CT imaging of the abdomen and pelvis was performed using the standard protocol following bolus administration of intravenous contrast. CONTRAST:  148mL ISOVUE-300 IOPAMIDOL (ISOVUE-300) INJECTION 61% COMPARISON:  02/01/2020 FINDINGS: Lower chest: Subtle increased markings are seen in the left lower lung fields. There is elevation of left hemidiaphragm. Hepatobiliary: Liver measures 19.7 cm in length. There are few low-density lesions in the liver each measuring less than 1.3 cm with no significant change, possibly suggesting cysts or hemangiomas. In image 20 of series 2, there is an ill-defined 2.1 cm area of low attenuation in the right lobe of liver. Gallbladder is unremarkable. Pancreas: No focal abnormality is seen. Spleen: Spleen measures 13.4 cm in maximum diameter. Adrenals/Urinary Tract: There is mild nodularity in the adrenals. There is no hydronephrosis. There are no renal or ureteral stones. There is 2 cm smooth marginated low-density lesion in the upper pole of left kidney suggesting renal cysts. Urinary bladder is unremarkable. Stomach/Bowel: Small hiatal hernia is seen. Stomach is not distended. Small bowel loops are not dilated. Appendix is not dilated. There is no significant wall thickening in the colon. There is swirling of mesenteric vessels in the lower abdomen. There is abnormal dilation of portion of sigmoid colon. There is incomplete distention of rectosigmoid. Vascular/Lymphatic: There are scattered atherosclerotic plaques and calcifications. No significant lymphadenopathy seen. Reproductive: Unremarkable. Other: There is no  pneumoperitoneum. There is small amount of free fluid in the pelvic cavity, possibly related to acute sigmoid volvulus. Small umbilical hernia containing fat is seen. Musculoskeletal: Degenerative changes are noted in the lumbar spine, particularly severe at L4-L5 level with spinal stenosis and encroachment of neural foramina. There are patchy areas of sclerosis in the vertebral bodies, possibly due to disc degeneration. IMPRESSION: There is sigmoid volvulus. Surgical consultation should be considered. There is no significant small bowel obstruction. There is no ascites or pneumoperitoneum. There is no hydronephrosis. Appendix is not dilated. Small hiatal hernia. There are few low-density foci in the  liver, possibly cysts or hemangiomas. There is an ill-defined 2.1 cm low-attenuation in the right lobe of liver which may be beam hardening artifact or space-occupying lesion. Short-term follow-up multiphasic CT may be considered. Enlarged liver and spleen. Imaging finding of sigmoid volvulus was relayed to Dr. Ronnald Ramp by telephone call. Electronically Signed   By: Elmer Picker M.D.   On: 03/07/2021 12:06   DG ABD ACUTE 2+V W 1V CHEST  Addendum Date: 03/07/2021   ADDENDUM REPORT: 03/07/2021 09:46 ADDENDUM: These results were called by telephone at the time of interpretation on 03/07/2021 at 9:46 am to provider Scarlette Calico , who verbally acknowledged these results. Electronically Signed   By: Markus Daft M.D.   On: 03/07/2021 09:46   Result Date: 03/07/2021 CLINICAL DATA:  Generalized abdominal tenderness without rebound tenderness. Severe constipation. EXAM: DG ABDOMEN ACUTE WITH 1 VIEW CHEST COMPARISON:  PET-CT 01/31/2021 and chest radiograph 12/27/2019 and CT abdomen 02/01/2020 FINDINGS: Stable position of the right jugular Port-A-Cath with the tip near the superior cavoatrial junction. Heart size is normal. Lungs are clear. Large dilated bowel loop in the mid upper abdomen. The configuration raises concern for  a potential sigmoid volvulus. There is stool throughout the transverse and right colon. No evidence for free air. Patchy areas of sclerosis throughout the pelvic bones compatible with osseous metastatic disease. IMPRESSION: 1. Large dilated loop of bowel in the upper midline of the abdomen. Review of prior CT imaging demonstrates sigmoid colon in this region. Configuration of the dilated sigmoid colon raises concern for a possible volvulus. Recommend further characterization with a CT of the abdomen and pelvis. 2. Moderate to large amount of stool in the abdomen. 3. No acute chest abnormality. 4. Osseous metastatic disease. Electronically Signed: By: Markus Daft M.D. On: 03/07/2021 09:36     Assessment & Plan:   Nels was seen today for anemia and hypertension.  Diagnoses and all orders for this visit:  Essential hypertension- His blood pressure is adequately well controlled. -     TSH; Future -     Magnesium; Future -     Magnesium -     TSH  Deficiency anemia- Will screen for vitamin deficiencies. -     Vitamin B12; Future -     IBC + Ferritin; Future -     Zinc; Future -     Vitamin B1; Future -     Folate; Future -     Reticulocytes; Future -     Methylmalonic acid, serum; Future -     CBC with Differential/Platelet; Future -     CBC with Differential/Platelet -     Methylmalonic acid, serum -     Reticulocytes -     Folate -     Vitamin B1 -     Zinc -     IBC + Ferritin -     Vitamin B12  Chronic idiopathic constipation -     TSH; Future -     Magnesium; Future -     Magnesium -     TSH  Gastroesophageal reflux disease without esophagitis -     CBC with Differential/Platelet; Future -     CBC with Differential/Platelet  Diuretic-induced hypokalemia -     Magnesium; Future -     Magnesium  Generalized abdominal tenderness without rebound tenderness -     Amylase; Future -     Lipase; Future -     CBC with Differential/Platelet; Future -  DG ABD ACUTE 2+V W 1V  CHEST; Future -     CBC with Differential/Platelet -     Lipase -     Amylase -     CT Abdomen Pelvis W Contrast; Future  Volvulus of descending colon ([place])- I recommended that he immediately go to the ED to consider treatment options. -     CT Abdomen Pelvis W Contrast; Future  Iron deficiency anemia secondary to inadequate dietary iron intake- Will start iron replacement therapy after the volvulus has been treated.   I have discontinued Pilar Plate Bruning's aspirin EC and meloxicam. I am also having him maintain his fexofenadine, BIOTIN 5000 PO, Denosumab (XGEVA [place]), multivitamin, hydrOXYzine, lidocaine-prilocaine, olmesartan, traMADol, Klor-Con M20, indapamide, atorvastatin, abiraterone acetate, and LORazepam.  No orders of the defined types were placed in this encounter.    Follow-up: Return in about 3 weeks (around 03/28/2021).  Scarlette Calico, MD

## 2021-03-07 NOTE — Consult Note (Signed)
Referring Provider: Dr. Varney Biles  Primary Care Physician:  [name], MD [facility]  Primary Gastroenterologist:  Althia Forts   Reason for Consultation: Sigmoid volvulus  HPI: Philip [name] is a 76 y.o. male with a past medical history of hypertension and prostate cancer s/p orchiectomy in 2016 with subsequent chemotherapy 2017. He developed central generalized abdominal 4 days ago. No BM x 5 days.  He was seen by his PCP who ordered a CTAP with contrast which was completed earlier today and showed evidence of a sigmoid volvulus.  He was sent directly to the ED for further evaluation.  Labs today showed significant hypokalemia with potassium level of 2.3.  KCl 10 mill equivalents IV x2 ordered, not yet administered.  Magnesium 1.9.  BUN 18.  Creatinine 0.71.  Normal LFTs.  WBC 8.1.  Hemoglobin 12.8.  Hematocrit 38.1.  Iron 30.  TIBC 375.2.  Ferritin 268.  SARS coronavirus 2 ordered, not yet collected.  He was seen by general surgery, no indication for urgent surgery at this time.  GI evaluation with flexible sigmoidoscopy for decompression recommended.  He typically passes a normal solid stool once daily. No rectal or melena.  No nausea or vomiting.  He is not on any blood thinners.  No narcotics.  He denies ever having a screening colonoscopy.  He underwent a Cologuard a few years ago which she reported was negative.  No known family history of colorectal cancer.   He last ate half sausage biscuit at 7:45 AM today.   No problems with sedation/anesthesia.  CTAP with contrast 03/07/2021: There is sigmoid volvulus. Surgical consultation should be considered.   There is no significant small bowel obstruction. There is no ascites or pneumoperitoneum. There is no hydronephrosis. Appendix is not dilated.   Small hiatal hernia. There are few low-density foci in the liver, possibly cysts or hemangiomas. There is an ill-defined 2.1 cm low-attenuation in the right lobe of liver  which may be beam hardening artifact or space-occupying lesion. Short-term follow-up multiphasic CT may be considered. Enlarged liver and spleen.   Past Medical History:  Diagnosis Date   Cancer [facility])    PROSTATE   Edema leg Sept 28, 2016   left leg from foot to thigh, increasinly worse over last 4 weeks   Hypertension    Hypoglycemia    Swelling LAST 30 DAYS DR [facility] AWARE   LEFT LEG AND FOOT    Past Surgical History:  Procedure Laterality Date   GUM SURGERY     TEETH IMPLANTS ALSO   ORCHIECTOMY Bilateral 01/03/2015   Procedure: ORCHIECTOMY;  Surgeon: Alexis Frock, MD;  Location: [facility];  Service: Urology;  Laterality: Bilateral;    Prior to Admission medications   Medication Sig Start Date End Date Taking? Authorizing Provider  abiraterone acetate (ZYTIGA) 250 MG tablet TAKE 4 TABLETS BY MOUTH ONCE DAILY AS DIRECTED.  TAKE 1 HOUR BEFORE OR 2 HOURS AFTER A MEAL 02/19/21  Yes [name], MD  atorvastatin (LIPITOR) 10 MG tablet TAKE 1 TABLET BY MOUTH EVERY DAY 02/07/21  Yes [name], MD  BIOTIN 5000 PO Take 1 tablet by mouth daily. 02/02/15  Yes [provider]  CALCIUM-VITAMIN D PO Take 1 tablet by mouth daily.   Yes [provider]  fexofenadine (ALLEGRA) 180 MG tablet Take 180 mg by mouth daily.   Yes [provider]  hydrOXYzine (ATARAX/VISTARIL) 10 MG tablet Take 10 mg by mouth in the morning and at bedtime.  03/31/18  Yes [provider]  indapamide (LOZOL) 1.25 MG tablet TAKE 1 TABLET BY MOUTH EVERY DAY 01/30/21  Yes [name], MD  KLOR-CON M20 20 MEQ tablet TAKE 1 TABLET BY MOUTH TWICE A DAY 12/31/20  Yes [name], MD  lidocaine-prilocaine (EMLA) cream Apply 1 application topically as needed. Patient taking differently: Apply 1 application topically as needed (access port). 12/27/18  Yes Shadad, Mathis Dad, MD  LORazepam (ATIVAN) 1 MG tablet TAKE 1 TABLET BY MOUTH EVERY 8 HOURS AS NEEDED FOR ANXIETY Patient taking  differently: Take 1 mg by mouth 2 (two) times daily. 02/27/21  Yes [name], MD  Multiple Vitamin (MULTIVITAMIN) tablet Take 1 tablet by mouth daily.   Yes [provider]  olmesartan (BENICAR) 20 MG tablet Take 1 tablet (20 mg total) by mouth daily. 06/12/20  Yes [name], MD  traMADol (ULTRAM) 50 MG tablet Take 1 tablet (50 mg total) by mouth every 12 (twelve) hours as needed for moderate pain. 11/12/20  Yes [name], MD  Denosumab (XGEVA [place]) Inject into the skin.    [provider]  azelastine (ASTELIN) 0.1 % nasal spray PLACE 1 SPRAY INTO BOTH NOSTRILS 2 (TWO) TIMES DAILY. USE IN [facility] NOSTRIL AS DIRECTED 09/26/18 07/30/19  [name], MD    Current Facility-Administered Medications  Medication Dose Route Frequency Provider Last Rate Last Admin   lactated ringers infusion   Intravenous Continuous Nanavati, Ankit, MD       potassium chloride 10 mEq in 100 mL IVPB  10 mEq Intravenous Q1 Hr x 6 Nanavati, Ankit, MD       Current Outpatient Medications  Medication Sig Dispense Refill   abiraterone acetate (ZYTIGA) 250 MG tablet TAKE 4 TABLETS BY MOUTH ONCE DAILY AS DIRECTED.  TAKE 1 HOUR BEFORE OR 2 HOURS AFTER A MEAL 120 tablet 10   atorvastatin (LIPITOR) 10 MG tablet TAKE 1 TABLET BY MOUTH EVERY DAY 90 tablet 1   BIOTIN 5000 PO Take 1 tablet by mouth daily.     CALCIUM-VITAMIN D PO Take 1 tablet by mouth daily.     fexofenadine (ALLEGRA) 180 MG tablet Take 180 mg by mouth daily.     hydrOXYzine (ATARAX/VISTARIL) 10 MG tablet Take 10 mg by mouth in the morning and at bedtime.     indapamide (LOZOL) 1.25 MG tablet TAKE 1 TABLET BY MOUTH EVERY DAY 90 tablet 1   KLOR-CON M20 20 MEQ tablet TAKE 1 TABLET BY MOUTH TWICE A DAY 180 tablet 0   lidocaine-prilocaine (EMLA) cream Apply 1 application topically as needed. (Patient taking differently: Apply 1 application topically as needed (access port).) 30 g 0   LORazepam (ATIVAN) 1 MG tablet TAKE 1 TABLET BY MOUTH  EVERY 8 HOURS AS NEEDED FOR ANXIETY (Patient taking differently: Take 1 mg by mouth 2 (two) times daily.) 90 tablet 0   Multiple Vitamin (MULTIVITAMIN) tablet Take 1 tablet by mouth daily.     olmesartan (BENICAR) 20 MG tablet Take 1 tablet (20 mg total) by mouth daily. 90 tablet 1   traMADol (ULTRAM) 50 MG tablet Take 1 tablet (50 mg total) by mouth every 12 (twelve) hours as needed for moderate pain. 180 tablet 1   Denosumab (XGEVA [place]) Inject into the skin.      Allergies as of 03/07/2021 - Review Complete 03/07/2021  Allergen Reaction Noted   Prednisone Anxiety 09/07/2017    Family History  Problem Relation Age of Onset  CVA Mother    CAD Father    Osteoarthritis Brother     Social History   Socioeconomic History   Marital status: Married    Spouse name: Not on file   Number of children: Not on file   Years of education: Not on file   Highest education level: Not on file  Occupational History   Not on file  Tobacco Use   Smoking status: Never   Smokeless tobacco: Never  Vaping Use   Vaping Use: Never used  Substance and Sexual Activity   Alcohol use: Yes    Alcohol/week: 1.0 standard drink    Types: 1 Glasses of wine per week    Comment: occasional wine with dinner   Drug use: No   Sexual activity: Not on file  Other Topics Concern   Not on file  Social History Narrative   Not on file   Social Determinants of Health   Financial Resource Strain: Low Risk    Difficulty of Paying Living Expenses: Not hard at all  Food Insecurity: No Food Insecurity   Worried About Charity fundraiser in the Last Year: Never true   [place] in the Last Year: Never true  Transportation Needs: No Transportation Needs   Lack of Transportation (Medical): No   Lack of Transportation (Non-Medical): No  Physical Activity: Sufficiently Active   Days of Exercise per Week: 5 days   Minutes of Exercise per Session: 30 min  Stress: No Stress Concern Present   Feeling of Stress :  Not at all  Social Connections: Moderately Isolated   Frequency of Communication with Friends and Family: More than three times a week   Frequency of Social Gatherings with Friends and Family: More than three times a week   Attends Religious Services: Never   Marine scientist or Organizations: Patient refused   Attends Archivist Meetings: Never   Marital Status: Married  Human resources officer Violence: Not At Risk   Fear of Current or Ex-Partner: No   Emotionally Abused: No   Physically Abused: No   Sexually Abused: No    Review of Systems: Gen: Denies fever, sweats or chills. No weight loss.  CV: Denies chest pain, palpitations or edema. Resp: Denies cough, shortness of breath of hemoptysis.  GI: See HPI. GU : Denies urinary burning, blood in urine, increased urinary frequency or incontinence. MS: Denies joint pain, muscles aches or weakness. Derm: Denies rash, itchiness, skin lesions or unhealing ulcers. Psych: Denies depression, anxiety or memory loss Heme: Denies easy bruising, bleeding. Neuro:  Denies headaches, dizziness or paresthesias. Endo:  Denies any problems with DM, thyroid or adrenal function.  Physical Exam: Vital signs in last 24 hours: Temp:  [97.9 F (36.6 C)-98.1 F (36.7 C)] 97.9 F (36.6 C) (01/05 1310) Pulse Rate:  [90-105] 90 (01/05 1500) Resp:  [16-19] 19 (01/05 1500) BP: (140-150)/(88-112) 140/91 (01/05 1500) SpO2:  [97 %-100 %] 100 % (01/05 1500) Weight:  [90.3 kg] 90.3 kg (01/05 1314)   General:  Alert 76 year old man no acute distress. Head:  Normocephalic and atraumatic. Eyes:  No scleral icterus. Conjunctiva pink. Ears:  Normal auditory acuity. Nose:  No deformity, discharge or lesions. Mouth:  Dentition intact. No ulcers or lesions.  Neck:  Supple. No lymphadenopathy or thyromegaly.  Lungs: Breath sounds clear throughout Chest: Right subclavian Mediport intact. Heart: Regular rate and rhythm, no murmurs. Abdomen: Abdomen  distended but not tense.  Mild to periumbilical tenderness without  rebound or guarding.  Hypoactive bowel sounds all 4 quadrants.  No palpable mass. Rectal: Deferred. Musculoskeletal:  Symmetrical without gross deformities.  Pulses:  Normal pulses noted. Extremities:  Without clubbing or edema. Neurologic:  Alert and  oriented x4. No focal deficits.  Skin:  Intact without significant lesions or rashes. Psych:  Alert and cooperative. Normal mood and affect.  Intake/Output from previous day: No intake/output data recorded. Intake/Output this shift: No intake/output data recorded.  Lab Results: Recent Labs    03/07/21 0915  WBC 8.1  HGB 12.8*  HCT 38.1*  PLT 242.0   BMET Recent Labs    03/07/21 1444  NA 136  K 2.3*  CL 104  CO2 25  GLUCOSE 117*  BUN 18  CREATININE 0.71  CALCIUM 9.2   LFT Recent Labs    03/07/21 1444  PROT 7.0  ALBUMIN 4.0  AST 21  ALT 19  ALKPHOS 54  BILITOT 1.1   PT/INR No results for input(s): LABPROT, INR in the last 72 hours. Hepatitis Panel No results for input(s): HEPBSAG, HCVAB, HEPAIGM, HEPBIGM in the last 72 hours.    Studies/Results: CT Abdomen Pelvis W Contrast  Result Date: 03/07/2021 CLINICAL DATA:  Abdominal pain EXAM: CT ABDOMEN AND PELVIS WITH CONTRAST TECHNIQUE: Multidetector CT imaging of the abdomen and pelvis was performed using the standard protocol following bolus administration of intravenous contrast. CONTRAST:  154mL ISOVUE-300 IOPAMIDOL (ISOVUE-300) INJECTION 61% COMPARISON:  02/01/2020 FINDINGS: Lower chest: Subtle increased markings are seen in the left lower lung fields. There is elevation of left hemidiaphragm. Hepatobiliary: Liver measures 19.7 cm in length. There are few low-density lesions in the liver each measuring less than 1.3 cm with no significant change, possibly suggesting cysts or hemangiomas. In image 20 of series 2, there is an ill-defined 2.1 cm area of low attenuation in the right lobe of liver.  Gallbladder is unremarkable. Pancreas: No focal abnormality is seen. Spleen: Spleen measures 13.4 cm in maximum diameter. Adrenals/Urinary Tract: There is mild nodularity in the adrenals. There is no hydronephrosis. There are no renal or ureteral stones. There is 2 cm smooth marginated low-density lesion in the upper pole of left kidney suggesting renal cysts. Urinary bladder is unremarkable. Stomach/Bowel: Small hiatal hernia is seen. Stomach is not distended. Small bowel loops are not dilated. Appendix is not dilated. There is no significant wall thickening in the colon. There is swirling of mesenteric vessels in the lower abdomen. There is abnormal dilation of portion of sigmoid colon. There is incomplete distention of rectosigmoid. Vascular/Lymphatic: There are scattered atherosclerotic plaques and calcifications. No significant lymphadenopathy seen. Reproductive: Unremarkable. Other: There is no pneumoperitoneum. There is small amount of free fluid in the pelvic cavity, possibly related to acute sigmoid volvulus. Small umbilical hernia containing fat is seen. Musculoskeletal: Degenerative changes are noted in the lumbar spine, particularly severe at L4-L5 level with spinal stenosis and encroachment of neural foramina. There are patchy areas of sclerosis in the vertebral bodies, possibly due to disc degeneration. IMPRESSION: There is sigmoid volvulus. Surgical consultation should be considered. There is no significant small bowel obstruction. There is no ascites or pneumoperitoneum. There is no hydronephrosis. Appendix is not dilated. Small hiatal hernia. There are few low-density foci in the liver, possibly cysts or hemangiomas. There is an ill-defined 2.1 cm low-attenuation in the right lobe of liver which may be beam hardening artifact or space-occupying lesion. Short-term follow-up multiphasic CT may be considered. Enlarged liver and spleen. Imaging finding of sigmoid volvulus was relayed  to Dr. Ronnald Ramp by  telephone call. Electronically Signed   By: Elmer Picker M.D.   On: 03/07/2021 12:06   DG ABD ACUTE 2+V W 1V CHEST  Addendum Date: 03/07/2021   ADDENDUM REPORT: 03/07/2021 09:46 ADDENDUM: These results were called by telephone at the time of interpretation on 03/07/2021 at 9:46 am to provider Scarlette Calico , who verbally acknowledged these results. Electronically Signed   By: Markus Daft M.D.   On: 03/07/2021 09:46   Result Date: 03/07/2021 CLINICAL DATA:  Generalized abdominal tenderness without rebound tenderness. Severe constipation. EXAM: DG ABDOMEN ACUTE WITH 1 VIEW CHEST COMPARISON:  PET-CT 01/31/2021 and chest radiograph 12/27/2019 and CT abdomen 02/01/2020 FINDINGS: Stable position of the right jugular Port-A-Cath with the tip near the superior cavoatrial junction. Heart size is normal. Lungs are clear. Large dilated bowel loop in the mid upper abdomen. The configuration raises concern for a potential sigmoid volvulus. There is stool throughout the transverse and right colon. No evidence for free air. Patchy areas of sclerosis throughout the pelvic bones compatible with osseous metastatic disease. IMPRESSION: 1. Large dilated loop of bowel in the upper midline of the abdomen. Review of prior CT imaging demonstrates sigmoid colon in this region. Configuration of the dilated sigmoid colon raises concern for a possible volvulus. Recommend further characterization with a CT of the abdomen and pelvis. 2. Moderate to large amount of stool in the abdomen. 3. No acute chest abnormality. 4. Osseous metastatic disease. Electronically Signed: By: Markus Daft M.D. On: 03/07/2021 09:36    IMPRESSION/PLAN:  46) 76 year old male with abdominal pain x4 days, no bowel movement x5 days.  CTAP at outpatient radiology today showed evidence of a sigmoid volvulus.  He was sent directly to the ED by his PCP for further evaluation.  He was seen by general surgery, no surgical indication at this time.  Flexible  sigmoidoscopy with decompression was recommended.  Hypokalemia likely contributing to volvulus.  KCl 10 mEq x 2 ordered, not yet administered. -NPO -Flexible sigmoidoscopy for decompression asap, benefits and risks discussed including risk with sedation, risk of bleeding, perforation and infection  -Administer KCl IV as previously ordered by the ED physician as soon as possible -Maintain potassium > 4  and < 5 and magnesium > 2 -Other GI recommendations to be determined after flexible sigmoidoscopy completed  2) IDA -Eventual EGD and colonoscopy as an outpatient  3) CTAP identified several low-density liver lesions and an ill-defined 2.1 cm right liver lobe lesion -Recommend abdominal MRI with and without contrast as an outpatient  4) History of prostate cancer, last chemotherapy was in 2017.  Right subclavian Mediport remains intact.    Philip [name]  03/07/2021, 3:42 PM

## 2021-03-07 NOTE — ED Triage Notes (Signed)
Patient c/o lower abdominal pain and distention of the upper abdomen x 4 days. Patient had a CT abdomen completed today and was told to come to the ED for a surgical consult.

## 2021-03-08 ENCOUNTER — Encounter (HOSPITAL_COMMUNITY): Payer: Self-pay | Admitting: Gastroenterology

## 2021-03-08 DIAGNOSIS — D509 Iron deficiency anemia, unspecified: Secondary | ICD-10-CM | POA: Diagnosis not present

## 2021-03-08 DIAGNOSIS — K562 Volvulus: Secondary | ICD-10-CM | POA: Diagnosis not present

## 2021-03-08 LAB — CBC
HCT: 31.5 % — ABNORMAL LOW (ref 39.0–52.0)
Hemoglobin: 10.8 g/dL — ABNORMAL LOW (ref 13.0–17.0)
MCH: 31.5 pg (ref 26.0–34.0)
MCHC: 34.3 g/dL (ref 30.0–36.0)
MCV: 91.8 fL (ref 80.0–100.0)
Platelets: 198 10*3/uL (ref 150–400)
RBC: 3.43 MIL/uL — ABNORMAL LOW (ref 4.22–5.81)
RDW: 12.9 % (ref 11.5–15.5)
WBC: 7.3 10*3/uL (ref 4.0–10.5)
nRBC: 0 % (ref 0.0–0.2)

## 2021-03-08 LAB — COMPREHENSIVE METABOLIC PANEL
ALT: 16 U/L (ref 0–44)
AST: 18 U/L (ref 15–41)
Albumin: 3 g/dL — ABNORMAL LOW (ref 3.5–5.0)
Alkaline Phosphatase: 45 U/L (ref 38–126)
Anion gap: 8 (ref 5–15)
BUN: 14 mg/dL (ref 8–23)
CO2: 27 mmol/L (ref 22–32)
Calcium: 8.2 mg/dL — ABNORMAL LOW (ref 8.9–10.3)
Chloride: 102 mmol/L (ref 98–111)
Creatinine, Ser: 0.71 mg/dL (ref 0.61–1.24)
GFR, Estimated: >60 mL/min (ref >60)
Glucose, Bld: 99 mg/dL (ref 70–99)
Potassium: <2 mmol/L — CL (ref 3.5–5.1)
Sodium: 137 mmol/L (ref 135–145)
Total Bilirubin: 1.4 mg/dL — ABNORMAL HIGH (ref 0.3–1.2)
Total Protein: 5.4 g/dL — ABNORMAL LOW (ref 6.5–8.1)

## 2021-03-08 LAB — RENAL FUNCTION PANEL
Albumin: 3 g/dL — ABNORMAL LOW (ref 3.5–5.0)
Anion gap: 9 (ref 5–15)
BUN: 14 mg/dL (ref 8–23)
CO2: 27 mmol/L (ref 22–32)
Calcium: 8.1 mg/dL — ABNORMAL LOW (ref 8.9–10.3)
Chloride: 101 mmol/L (ref 98–111)
Creatinine, Ser: 0.68 mg/dL (ref 0.61–1.24)
GFR, Estimated: >60 mL/min (ref >60)
Glucose, Bld: 99 mg/dL (ref 70–99)
Phosphorus: 2.6 mg/dL (ref 2.5–4.6)
Potassium: <2 mmol/L — CL (ref 3.5–5.1)
Sodium: 137 mmol/L (ref 135–145)

## 2021-03-08 LAB — POTASSIUM
Potassium: 2.8 mmol/L — ABNORMAL LOW (ref 3.5–5.1)
Potassium: 2.8 mmol/L — ABNORMAL LOW (ref 3.5–5.1)

## 2021-03-08 MED ORDER — FERROUS SULFATE 325 (65 FE) MG PO TABS
325.0000 mg | ORAL_TABLET | Freq: Every day | ORAL | Status: DC
  Administered 2021-03-09: 325 mg via ORAL
  Filled 2021-03-08: qty 1

## 2021-03-08 MED ORDER — PANTOPRAZOLE SODIUM 40 MG IV SOLR
40.0000 mg | INTRAVENOUS | Status: DC
  Administered 2021-03-08 – 2021-03-09 (×2): 40 mg via INTRAVENOUS
  Filled 2021-03-08 (×2): qty 40

## 2021-03-08 MED ORDER — POLYETHYLENE GLYCOL 3350 17 G PO PACK
17.0000 g | PACK | Freq: Every day | ORAL | Status: DC
  Administered 2021-03-09: 17 g via ORAL
  Filled 2021-03-08: qty 1

## 2021-03-08 MED ORDER — POTASSIUM CHLORIDE CRYS ER 20 MEQ PO TBCR
60.0000 meq | EXTENDED_RELEASE_TABLET | ORAL | Status: AC
  Administered 2021-03-08 – 2021-03-09 (×2): 60 meq via ORAL
  Filled 2021-03-08 (×2): qty 3

## 2021-03-08 MED ORDER — POTASSIUM CHLORIDE CRYS ER 20 MEQ PO TBCR
40.0000 meq | EXTENDED_RELEASE_TABLET | ORAL | Status: AC
  Administered 2021-03-08 (×2): 40 meq via ORAL
  Filled 2021-03-08 (×2): qty 2

## 2021-03-08 MED ORDER — LIP MEDEX EX OINT
TOPICAL_OINTMENT | CUTANEOUS | Status: DC | PRN
  Filled 2021-03-08: qty 7

## 2021-03-08 MED ORDER — POTASSIUM CHLORIDE 10 MEQ/100ML IV SOLN
INTRAVENOUS | Status: AC
  Administered 2021-03-08: 10 meq via INTRAVENOUS
  Filled 2021-03-08: qty 100

## 2021-03-08 MED ORDER — POTASSIUM CHLORIDE 10 MEQ/100ML IV SOLN
10.0000 meq | Freq: Once | INTRAVENOUS | Status: AC
  Filled 2021-03-08: qty 100

## 2021-03-08 MED ORDER — POTASSIUM CHLORIDE 10 MEQ/100ML IV SOLN
10.0000 meq | INTRAVENOUS | Status: AC
  Administered 2021-03-08 (×6): 10 meq via INTRAVENOUS
  Filled 2021-03-08 (×5): qty 100

## 2021-03-08 NOTE — TOC Initial Note (Signed)
Transition of Care [facility]) - Initial/Assessment Note    Patient Details  Name: Philip [name] MRN: [id number] Date of Birth: 1946-01-17  [facility]) CM/SW Contact:    Dessa Phi, RN Phone Number: 03/08/2021, 1:20 PM  Clinical Narrative:  From home. D/c plan home.                 Expected Discharge Plan: Home/Self Care Barriers to Discharge: Continued Medical Work up   Patient Goals and CMS Choice Patient states their goals for this hospitalization and ongoing recovery are:: go home CMS Medicare.gov Compare Post Acute Care list provided to:: Patient Choice offered to / list presented to : Patient  Expected Discharge Plan and Services Expected Discharge Plan: Home/Self Care   Discharge Planning Services: CM Consult   Living arrangements for the past 2 months: Single Family Home                                      Prior Living Arrangements/Services Living arrangements for the past 2 months: Single Family Home Lives with:: Spouse                   Activities of Daily Living      Permission Sought/Granted                  Emotional Assessment              Admission diagnosis:  Volvulus ([place]) [K56.2] Acute hypokalemia [E87.6] Sigmoid volvulus ([place]) [K56.2] Patient Active Problem List   Diagnosis Date Noted   Generalized abdominal tenderness without rebound tenderness 03/07/2021   Iron deficiency anemia 03/07/2021   Sigmoid volvulus ([place]) 03/07/2021   Volvulus ([place]) 03/07/2021   Abnormal finding on GI tract imaging    Diuretic-induced hypokalemia 12/27/2019   Essential hypertension 03/30/2018   Routine general medical examination at a health care facility 03/30/2018   Port-A-Cath in place 08/25/2017   Hyperlipidemia LDL goal <100 03/16/2017   Atherosclerosis of aorta ([place]) 03/11/2017   Deficiency anemia 03/11/2017   Chronic idiopathic constipation 03/11/2017   Seasonal allergic rhinitis due to pollen 03/11/2017    Gastroesophageal reflux disease without esophagitis 03/11/2017   Malignant neoplasm of bone ([place]) 10/16/2015   Prostate cancer ([place]) 02/02/2015   PCP:  [name], MD Pharmacy:   [facility] [place], [place] - [address] [place] [address] Phone: [phone] Fax: [place] [address] Phone: [phone] Fax: [phone]     Social Determinants of Health (SDOH) Interventions    Readmission Risk Interventions No flowsheet data found.

## 2021-03-08 NOTE — Progress Notes (Addendum)
PROGRESS NOTE    Philip [name]  DUK:[id number] DOB: Oct 05, 1945 DOA: 03/07/2021 PCP: [name], MD    Brief Narrative:  Philip [name] is a 76 year old male with past medical history significant for essential hypertension, hyperlipidemia, prostate cancer who presented to [facility] ED at the advisement of his PCP following abnormal imaging studies with complaints of constipation and abdominal pain.  Onset 4 days ago with worsening abdominal pain/distention over the last 2 days.  Also associated with decreased appetite with nausea and no vomiting.  Outpatient CT abdomen/pelvis notable for volvulus and was referred to the ED for further evaluation.  In the ED, temperature 97.9 F, HR 105, RR 16, BP 150/112, SPO2 98% on room air.  Sodium 136, potassium 2.3, chloride 104, CO2 25, glucose 117, BUN 18, creatinine 0.71.  Alkaline phosphatase 54, AST 21, ALT 19, total bilirubin 1.1.  Lipase 25.0.  WBC 8.1, hemoglobin 12.8, platelets 242.  TSH 1.87.  Influenza A/B PCR negative.  COVID-19 PCR negative.  Urinalysis unrevealing.  Abdominal x-ray with large dilated loop of bowel upper midline of the abdomen concerning for possible volvulus, moderate-large amount of stool in the abdomen, osseous metastatic disease.  CT abdomen/pelvis with sigmoid volvulus, no significant small bowel obstruction, no ascites or pneumoperitoneum, ill-defined 2.1 cm low-attenuation right lobe of liver.   Assessment & Plan:   Principal Problem:   Volvulus ([place]) Active Problems:   Iron deficiency anemia   Sigmoid volvulus ([facility])   Abnormal finding on GI tract imaging   Sigmoid volvulus Patient presenting to the ED with 4-day history of progressive abdominal pain associated with nausea and constipation.  Imaging outpatient notable for sigmoid volvulus.  Underwent flexible sigmoidoscopy by gastroenterology, Dr. Havery Moros on 1/5 with decompression and placement of rectal tube.  Also seen by general surgery for recommendation of possible  sigmoidectomy to prevent volvulus recurrence; although patient wishes to defer surgery for 1-2 weeks given current work schedule to complete a Therapist, music. --[facility] and general surgery following, appreciate assistance --Clear liquid diet --GI to consider EGD/colonoscopy inpatient versus outpatient --Strict I's and O's  Iron deficiency anemia No overt GI bleeding, patient states this is well known and likely secondary to his previous chemotherapy.  Anemia panel with iron 30, TIBC 375.2, ferritin 165.1, folate greater than 24.2, B12 528. Hgb 12.8>10.8; MCV 91.8.  Hypokalemia Potassium less than 2.0 today.  Replating.  Repeat potassium level this afternoon 2.8. --K-Dur 40 mEq x 2 this afternoon --Repeat potassium level tonight at 8 PM  Liver lesion Incidental finding on CT abdomen/pelvis of low-density liver lesion and ill-defined 2.1 cm right liver lobe lesion.  Will need abdominal MRI with and without contrast as outpatient.  Essential hypertension BP 121/63 this morning, well controlled. --Continue to hold home olmesartan for now --Continue monitor BP closely  Hyperlipidemia: Holding statin for now  Hx prostate cancer with bony metastasis Follows with medical oncology, Dr. Alen Blew.  Last chemotherapy 2017.  GERD: Protonix 40 mg IV daily   DVT prophylaxis: SCDs Start: 03/07/21 1940   Code Status: Full Code Family Communication: No family present at bedside this morning.  Disposition Plan:  Level of care: Progressive Status is: Inpatient  Remains inpatient appropriate because: Advancing diet to clears today, general surgery to consider sigmoidectomy and GI to consider inpatient versus outpatient colonoscopy/EGD.   Consultants:  General surgery [facility]  Procedures:  Flexible sigmoidoscopy with decompression of volvulus 1/5, Dr. Havery Moros  Antimicrobials:  None   Subjective: Patient seen examined bedside, resting  comfortably.  Walking around the room.   States abdominal pain/distention much improved.  Does not report flatus.  No bowel movement as of yet.  Concerned about having further procedures/surgery as he is in the middle of a Architect project and wishes to delay any further treatment until after he completes this in a week or 2.  He states will discuss with his wife.  Discussed plan with GI this morning.  Patient with no other complaints or concerns at this time and is extremely appreciative all the efforts during his hospitalization.  Denies headache, no chest pain, no palpitations, no fever/chills/night sweats, no nausea/vomiting/diarrhea, no current abdominal pain, no weakness, no fatigue, no paresthesias.  No acute events overnight per nursing staff.  Objective: Vitals:   03/07/21 2026 03/08/21 0000 03/08/21 0543 03/08/21 1256  BP: (!) 155/91 (!) 143/85 121/63 (!) 154/93  Pulse: 81 83 73 81  Resp: 18 16 18 16   Temp: 98.4 F (36.9 C) 97.8 F (36.6 C) 98.2 F (36.8 C) (!) 97.3 F (36.3 C)  TempSrc: Oral     SpO2: 100% 100% 98% 100%  Weight: 89.8 kg     Height: 6\' 4"  (1.93 m)       Intake/Output Summary (Last 24 hours) at 03/08/2021 1309 Last data filed at 03/08/2021 0602 Gross per 24 hour  Intake 1411.21 ml  Output --  Net 1411.21 ml   Filed Weights   03/07/21 1314 03/07/21 1618 03/07/21 2026  Weight: 90.3 kg 89.8 kg 89.8 kg    Examination:  General exam: Appears calm and comfortable  Respiratory system: Clear to auscultation. Respiratory effort normal. Cardiovascular system: S1 & S2 heard, RRR. No JVD, murmurs, rubs, gallops or clicks. No pedal edema. Gastrointestinal system: Abdomen is nondistended, soft and nontender. No organomegaly or masses felt.  Faint bowel sounds. Central nervous system: Alert and oriented. No focal neurological deficits. Extremities: Symmetric 5 x 5 power. Skin: No rashes, lesions or ulcers Psychiatry: Judgement and insight appear normal. Mood & affect appropriate.     Data Reviewed: I  have personally reviewed following labs and imaging studies  CBC: Recent Labs  Lab 03/07/21 0915 03/08/21 0320  WBC 8.1 7.3  NEUTROABS 5.7  --   HGB 12.8* 10.8*  HCT 38.1* 31.5*  MCV 91.8 91.8  PLT 242.0 629   Basic Metabolic Panel: Recent Labs  Lab 03/07/21 0915 03/07/21 1444 03/08/21 0320 03/08/21 1210  NA  --  136 137   137  --   K  --  2.3* <2.0*   <2.0* 2.8*  CL  --  104 102   101  --   CO2  --  25 27   27   --   GLUCOSE  --  117* 99   99  --   BUN  --  18 14   14   --   CREATININE  --  0.71 0.71   0.68  --   CALCIUM  --  9.2 8.2*   8.1*  --   MG 1.9 2.0  --   --   PHOS  --   --  2.6  --    GFR: Estimated Creatinine Clearance: 96.4 mL/min (by C-G formula based on SCr of 0.68 mg/dL). Liver Function Tests: Recent Labs  Lab 03/07/21 1444 03/08/21 0320  AST 21 18  ALT 19 16  ALKPHOS 54 45  BILITOT 1.1 1.4*  PROT 7.0 5.4*  ALBUMIN 4.0 3.0*   3.0*   Recent Labs  Lab 03/07/21 0915  LIPASE 25.0  AMYLASE 42   No results for input(s): AMMONIA in the last 168 hours. Coagulation Profile: No results for input(s): INR, PROTIME in the last 168 hours. Cardiac Enzymes: No results for input(s): CKTOTAL, CKMB, CKMBINDEX, TROPONINI in the last 168 hours. BNP (last 3 results) No results for input(s): PROBNP in the last 8760 hours. HbA1C: No results for input(s): HGBA1C in the last 72 hours. CBG: No results for input(s): GLUCAP in the last 168 hours. Lipid Profile: No results for input(s): CHOL, HDL, LDLCALC, TRIG, CHOLHDL, LDLDIRECT in the last 72 hours. Thyroid Function Tests: Recent Labs    03/07/21 0915  TSH 1.87   Anemia Panel: Recent Labs    03/07/21 0915  VITAMINB12 528  FOLATE >24.2  FERRITIN 165.1  TIBC 375.2  IRON 30*   Sepsis Labs: No results for input(s): PROCALCITON, LATICACIDVEN in the last 168 hours.  Recent Results (from the past 240 hour(s))  Resp Panel by RT-PCR (Flu A&B, Covid) Nasopharyngeal Swab     Status: None   Collection  Time: 03/07/21  6:30 PM   Specimen: Nasopharyngeal Swab; Nasopharyngeal(NP) swabs in vial transport medium  Result Value Ref Range Status   SARS Coronavirus 2 by RT PCR NEGATIVE NEGATIVE Final    Comment: (NOTE) SARS-CoV-2 target nucleic acids are NOT DETECTED.  The SARS-CoV-2 RNA is generally detectable in upper respiratory specimens during the acute phase of infection. The lowest concentration of SARS-CoV-2 viral copies this assay can detect is 138 copies/mL. A negative result does not preclude SARS-Cov-2 infection and should not be used as the sole basis for treatment or other patient management decisions. A negative result may occur with  improper specimen collection/handling, submission of specimen other than nasopharyngeal swab, presence of viral mutation(s) within the areas targeted by this assay, and inadequate number of viral copies(<138 copies/mL). A negative result must be combined with clinical observations, patient history, and epidemiological information. The expected result is Negative.  Fact Sheet for Patients:  EntrepreneurPulse.com.au  Fact Sheet for Healthcare Providers:  IncredibleEmployment.be  This test is no t yet approved or cleared by the [place] FDA and  has been authorized for detection and/or diagnosis of SARS-CoV-2 by FDA under an Emergency Use Authorization (EUA). This EUA will remain  in effect (meaning this test can be used) for the duration of the COVID-19 declaration under Section 564(b)(1) of the Act, 21 U.S.C.section 360bbb-3(b)(1), unless the authorization is terminated  or revoked sooner.       Influenza A by PCR NEGATIVE NEGATIVE Final   Influenza B by PCR NEGATIVE NEGATIVE Final    Comment: (NOTE) The Xpert Xpress SARS-CoV-2/FLU/RSV plus assay is intended as an aid in the diagnosis of influenza from Nasopharyngeal swab specimens and should not be used as a sole basis for treatment. Nasal washings  and aspirates are unacceptable for Xpert Xpress SARS-CoV-2/FLU/RSV testing.  Fact Sheet for Patients: EntrepreneurPulse.com.au  Fact Sheet for Healthcare Providers: IncredibleEmployment.be  This test is not yet approved or cleared by the [place] FDA and has been authorized for detection and/or diagnosis of SARS-CoV-2 by FDA under an Emergency Use Authorization (EUA). This EUA will remain in effect (meaning this test can be used) for the duration of the COVID-19 declaration under Section 564(b)(1) of the Act, 21 U.S.C. section 360bbb-3(b)(1), unless the authorization is terminated or revoked.  Performed at [facility], [place] [address], [place], [place] [address]          Radiology Studies: CT Abdomen Pelvis  W Contrast  Result Date: 03/07/2021 CLINICAL DATA:  Abdominal pain EXAM: CT ABDOMEN AND PELVIS WITH CONTRAST TECHNIQUE: Multidetector CT imaging of the abdomen and pelvis was performed using the standard protocol following bolus administration of intravenous contrast. CONTRAST:  15mL ISOVUE-300 IOPAMIDOL (ISOVUE-300) INJECTION 61% COMPARISON:  02/01/2020 FINDINGS: Lower chest: Subtle increased markings are seen in the left lower lung fields. There is elevation of left hemidiaphragm. Hepatobiliary: Liver measures 19.7 cm in length. There are few low-density lesions in the liver each measuring less than 1.3 cm with no significant change, possibly suggesting cysts or hemangiomas. In image 20 of series 2, there is an ill-defined 2.1 cm area of low attenuation in the right lobe of liver. Gallbladder is unremarkable. Pancreas: No focal abnormality is seen. Spleen: Spleen measures 13.4 cm in maximum diameter. Adrenals/Urinary Tract: There is mild nodularity in the adrenals. There is no hydronephrosis. There are no renal or ureteral stones. There is 2 cm smooth marginated low-density lesion in the upper pole of left kidney suggesting  renal cysts. Urinary bladder is unremarkable. Stomach/Bowel: Small hiatal hernia is seen. Stomach is not distended. Small bowel loops are not dilated. Appendix is not dilated. There is no significant wall thickening in the colon. There is swirling of mesenteric vessels in the lower abdomen. There is abnormal dilation of portion of sigmoid colon. There is incomplete distention of rectosigmoid. Vascular/Lymphatic: There are scattered atherosclerotic plaques and calcifications. No significant lymphadenopathy seen. Reproductive: Unremarkable. Other: There is no pneumoperitoneum. There is small amount of free fluid in the pelvic cavity, possibly related to acute sigmoid volvulus. Small umbilical hernia containing fat is seen. Musculoskeletal: Degenerative changes are noted in the lumbar spine, particularly severe at L4-L5 level with spinal stenosis and encroachment of neural foramina. There are patchy areas of sclerosis in the vertebral bodies, possibly due to disc degeneration. IMPRESSION: There is sigmoid volvulus. Surgical consultation should be considered. There is no significant small bowel obstruction. There is no ascites or pneumoperitoneum. There is no hydronephrosis. Appendix is not dilated. Small hiatal hernia. There are few low-density foci in the liver, possibly cysts or hemangiomas. There is an ill-defined 2.1 cm low-attenuation in the right lobe of liver which may be beam hardening artifact or space-occupying lesion. Short-term follow-up multiphasic CT may be considered. Enlarged liver and spleen. Imaging finding of sigmoid volvulus was relayed to Dr. Ronnald Ramp by telephone call. Electronically Signed   By: Elmer Picker M.D.   On: 03/07/2021 12:06   DG ABD ACUTE 2+V W 1V CHEST  Addendum Date: 03/07/2021   ADDENDUM REPORT: 03/07/2021 09:46 ADDENDUM: These results were called by telephone at the time of interpretation on 03/07/2021 at 9:46 am to provider Scarlette Calico , who verbally acknowledged these  results. Electronically Signed   By: Markus Daft M.D.   On: 03/07/2021 09:46   Result Date: 03/07/2021 CLINICAL DATA:  Generalized abdominal tenderness without rebound tenderness. Severe constipation. EXAM: DG ABDOMEN ACUTE WITH 1 VIEW CHEST COMPARISON:  PET-CT 01/31/2021 and chest radiograph 12/27/2019 and CT abdomen 02/01/2020 FINDINGS: Stable position of the right jugular Port-A-Cath with the tip near the superior cavoatrial junction. Heart size is normal. Lungs are clear. Large dilated bowel loop in the mid upper abdomen. The configuration raises concern for a potential sigmoid volvulus. There is stool throughout the transverse and right colon. No evidence for free air. Patchy areas of sclerosis throughout the pelvic bones compatible with osseous metastatic disease. IMPRESSION: 1. Large dilated loop of bowel in the upper midline of the  abdomen. Review of prior CT imaging demonstrates sigmoid colon in this region. Configuration of the dilated sigmoid colon raises concern for a possible volvulus. Recommend further characterization with a CT of the abdomen and pelvis. 2. Moderate to large amount of stool in the abdomen. 3. No acute chest abnormality. 4. Osseous metastatic disease. Electronically Signed: By: Markus Daft M.D. On: 03/07/2021 09:36   DG Abd Portable 1V  Result Date: 03/07/2021 CLINICAL DATA:  Endoscopic decompression of sigmoid volvulus EXAM: PORTABLE ABDOMEN - 1 VIEW COMPARISON:  03/07/2021 CT and x-ray FINDINGS: Supine frontal view of the abdomen and pelvis was performed, excluding the hemidiaphragms by collimation. Rectal tube overlies the distal rectosigmoid colon. There has been marked decompression of the gaseous distension of the sigmoid colon since prior study. No evidence of bowel obstruction or ileus. Moderate residual stool within the colon unchanged. No masses or abnormal calcifications. Sclerotic metastases throughout the spine, bony pelvis, and hips from known prostate cancer.  IMPRESSION: 1. Interval decompression of the previous sigmoid volvulus, with indwelling rectal tube overlying the distal rectosigmoid colon. 2. Moderate fecal retention. 3. Stable bony metastases from prostate cancer. These results were discussed by telephone at the time of interpretation on 03/07/2021 at 6:02 pm to provider [facility] , who verbally acknowledged these results. Electronically Signed   By: Randa Ngo M.D.   On: 03/07/2021 18:14        Scheduled Meds:  Chlorhexidine Gluconate Cloth  6 each Topical Daily   Continuous Infusions:  potassium chloride 10 mEq (03/08/21 1227)     LOS: 1 day    Time spent: 46 minutes spent on chart review, discussion with nursing staff, consultants, updating family and interview/physical exam; more than 50% of that time was spent in counseling and/or coordination of care.    Avin Upperman J [place] DO [facility] Available via Epic secure chat 7am-7pm After these hours, please refer to coverage provider listed on [email or web address] 03/08/2021, 1:09 PM

## 2021-03-08 NOTE — Progress Notes (Signed)
Progress Note  1 Day Post-Op  Subjective: Feeling well this am. OOB sitting on bench. Bloating resolved. No pain. No nausea. No flatus or BM.   Objective: Vital signs in last 24 hours: Temp:  [96.8 F (36 C)-98.4 F (36.9 C)] 98.2 F (36.8 C) (01/06 0543) Pulse Rate:  [73-105] 73 (01/06 0543) Resp:  [14-25] 18 (01/06 0543) BP: (121-179)/(63-112) 121/63 (01/06 0543) SpO2:  [97 %-100 %] 98 % (01/06 0543) Weight:  [89.8 kg-90.3 kg] 89.8 kg (01/05 2026) Last BM Date: 03/03/21 (here for bowel decompression- sigmoidoscopy completed PTA)  Intake/Output from previous day: 01/05 0701 - 01/06 0700 In: 1411.2 [I.V.:711.2; IV Piggyback:700] Out: -  Intake/Output this shift: No intake/output data recorded.  PE: General: pleasant, WD, male in NAD HEENT: head is normocephalic, atraumatic.  Mouth is pink and moist Heart: regular, rate, and rhythm.  Palpable radial and pedal pulses bilaterally Lungs: CTAB, no wheezes, rhonchi, or rales noted.  Respiratory effort nonlabored Abd: soft, NT, ND, +BS MSK: all 4 extremities are symmetrical with no cyanosis, clubbing, or edema. Skin: warm and dry Psych: A&Ox3 with an appropriate affect.    Lab Results:  Recent Labs    03/07/21 0915 03/08/21 0320  WBC 8.1 7.3  HGB 12.8* 10.8*  HCT 38.1* 31.5*  PLT 242.0 198   BMET Recent Labs    03/07/21 1444 03/08/21 0320  NA 136 137   137  K 2.3* <2.0*   <2.0*  CL 104 102   101  CO2 25 27   27   GLUCOSE 117* 99   99  BUN 18 14   14   CREATININE 0.71 0.71   0.68  CALCIUM 9.2 8.2*   8.1*   PT/INR No results for input(s): LABPROT, INR in the last 72 hours. CMP     Component Value Date/Time   NA 137 03/08/2021 0320   NA 137 03/08/2021 0320   NA 139 02/10/2017 1323   K <2.0 (LL) 03/08/2021 0320   K <2.0 (LL) 03/08/2021 0320   K 3.4 (L) 02/10/2017 1323   CL 101 03/08/2021 0320   CL 102 03/08/2021 0320   CO2 27 03/08/2021 0320   CO2 27 03/08/2021 0320   CO2 23 02/10/2017 1323    GLUCOSE 99 03/08/2021 0320   GLUCOSE 99 03/08/2021 0320   GLUCOSE 117 02/10/2017 1323   BUN 14 03/08/2021 0320   BUN 14 03/08/2021 0320   BUN 13.6 02/10/2017 1323   CREATININE 0.68 03/08/2021 0320   CREATININE 0.71 03/08/2021 0320   CREATININE 1.15 02/05/2021 1403   CREATININE 0.9 02/10/2017 1323   CALCIUM 8.1 (L) 03/08/2021 0320   CALCIUM 8.2 (L) 03/08/2021 0320   CALCIUM 9.2 02/10/2017 1323   PROT 5.4 (L) 03/08/2021 0320   PROT 6.7 02/10/2017 1323   ALBUMIN 3.0 (L) 03/08/2021 0320   ALBUMIN 3.0 (L) 03/08/2021 0320   ALBUMIN 3.8 02/10/2017 1323   AST 18 03/08/2021 0320   AST 19 02/05/2021 1403   AST 20 02/10/2017 1323   ALT 16 03/08/2021 0320   ALT 16 02/05/2021 1403   ALT 15 02/10/2017 1323   ALKPHOS 45 03/08/2021 0320   ALKPHOS 50 02/10/2017 1323   BILITOT 1.4 (H) 03/08/2021 0320   BILITOT 0.5 02/05/2021 1403   BILITOT 0.52 02/10/2017 1323   GFRNONAA >60 03/08/2021 0320   GFRNONAA >60 03/08/2021 0320   GFRNONAA >60 02/05/2021 1403   GFRAA >60 11/29/2019 1157   Lipase     Component Value Date/Time  LIPASE 25.0 03/07/2021 0915       Studies/Results: CT Abdomen Pelvis W Contrast  Result Date: 03/07/2021 CLINICAL DATA:  Abdominal pain EXAM: CT ABDOMEN AND PELVIS WITH CONTRAST TECHNIQUE: Multidetector CT imaging of the abdomen and pelvis was performed using the standard protocol following bolus administration of intravenous contrast. CONTRAST:  155mL ISOVUE-300 IOPAMIDOL (ISOVUE-300) INJECTION 61% COMPARISON:  02/01/2020 FINDINGS: Lower chest: Subtle increased markings are seen in the left lower lung fields. There is elevation of left hemidiaphragm. Hepatobiliary: Liver measures 19.7 cm in length. There are few low-density lesions in the liver each measuring less than 1.3 cm with no significant change, possibly suggesting cysts or hemangiomas. In image 20 of series 2, there is an ill-defined 2.1 cm area of low attenuation in the right lobe of liver. Gallbladder is  unremarkable. Pancreas: No focal abnormality is seen. Spleen: Spleen measures 13.4 cm in maximum diameter. Adrenals/Urinary Tract: There is mild nodularity in the adrenals. There is no hydronephrosis. There are no renal or ureteral stones. There is 2 cm smooth marginated low-density lesion in the upper pole of left kidney suggesting renal cysts. Urinary bladder is unremarkable. Stomach/Bowel: Small hiatal hernia is seen. Stomach is not distended. Small bowel loops are not dilated. Appendix is not dilated. There is no significant wall thickening in the colon. There is swirling of mesenteric vessels in the lower abdomen. There is abnormal dilation of portion of sigmoid colon. There is incomplete distention of rectosigmoid. Vascular/Lymphatic: There are scattered atherosclerotic plaques and calcifications. No significant lymphadenopathy seen. Reproductive: Unremarkable. Other: There is no pneumoperitoneum. There is small amount of free fluid in the pelvic cavity, possibly related to acute sigmoid volvulus. Small umbilical hernia containing fat is seen. Musculoskeletal: Degenerative changes are noted in the lumbar spine, particularly severe at L4-L5 level with spinal stenosis and encroachment of neural foramina. There are patchy areas of sclerosis in the vertebral bodies, possibly due to disc degeneration. IMPRESSION: There is sigmoid volvulus. Surgical consultation should be considered. There is no significant small bowel obstruction. There is no ascites or pneumoperitoneum. There is no hydronephrosis. Appendix is not dilated. Small hiatal hernia. There are few low-density foci in the liver, possibly cysts or hemangiomas. There is an ill-defined 2.1 cm low-attenuation in the right lobe of liver which may be beam hardening artifact or space-occupying lesion. Short-term follow-up multiphasic CT may be considered. Enlarged liver and spleen. Imaging finding of sigmoid volvulus was relayed to Dr. Ronnald Ramp by telephone call.  Electronically Signed   By: Elmer Picker M.D.   On: 03/07/2021 12:06   DG ABD ACUTE 2+V W 1V CHEST  Addendum Date: 03/07/2021   ADDENDUM REPORT: 03/07/2021 09:46 ADDENDUM: These results were called by telephone at the time of interpretation on 03/07/2021 at 9:46 am to provider Scarlette Calico , who verbally acknowledged these results. Electronically Signed   By: Markus Daft M.D.   On: 03/07/2021 09:46   Result Date: 03/07/2021 CLINICAL DATA:  Generalized abdominal tenderness without rebound tenderness. Severe constipation. EXAM: DG ABDOMEN ACUTE WITH 1 VIEW CHEST COMPARISON:  PET-CT 01/31/2021 and chest radiograph 12/27/2019 and CT abdomen 02/01/2020 FINDINGS: Stable position of the right jugular Port-A-Cath with the tip near the superior cavoatrial junction. Heart size is normal. Lungs are clear. Large dilated bowel loop in the mid upper abdomen. The configuration raises concern for a potential sigmoid volvulus. There is stool throughout the transverse and right colon. No evidence for free air. Patchy areas of sclerosis throughout the pelvic bones compatible with osseous metastatic  disease. IMPRESSION: 1. Large dilated loop of bowel in the upper midline of the abdomen. Review of prior CT imaging demonstrates sigmoid colon in this region. Configuration of the dilated sigmoid colon raises concern for a possible volvulus. Recommend further characterization with a CT of the abdomen and pelvis. 2. Moderate to large amount of stool in the abdomen. 3. No acute chest abnormality. 4. Osseous metastatic disease. Electronically Signed: By: Markus Daft M.D. On: 03/07/2021 09:36   DG Abd Portable 1V  Result Date: 03/07/2021 CLINICAL DATA:  Endoscopic decompression of sigmoid volvulus EXAM: PORTABLE ABDOMEN - 1 VIEW COMPARISON:  03/07/2021 CT and x-ray FINDINGS: Supine frontal view of the abdomen and pelvis was performed, excluding the hemidiaphragms by collimation. Rectal tube overlies the distal rectosigmoid colon.  There has been marked decompression of the gaseous distension of the sigmoid colon since prior study. No evidence of bowel obstruction or ileus. Moderate residual stool within the colon unchanged. No masses or abnormal calcifications. Sclerotic metastases throughout the spine, bony pelvis, and hips from known prostate cancer. IMPRESSION: 1. Interval decompression of the previous sigmoid volvulus, with indwelling rectal tube overlying the distal rectosigmoid colon. 2. Moderate fecal retention. 3. Stable bony metastases from prostate cancer. These results were discussed by telephone at the time of interpretation on 03/07/2021 at 6:02 pm to provider [facility] , who verbally acknowledged these results. Electronically Signed   By: Randa Ngo M.D.   On: 03/07/2021 18:14    Anti-infectives: Anti-infectives (From admission, onward)    None        Assessment/Plan  Sigmoid volvulus  - CT 1/5 with sigmoid volvulus without SBO, ascites, or free air - afebrile, WBC normal - anemia - Hgb/Hct 10.8/31.5 - underwent successful flex sig for decompression with GI 1/5 - due to stool burden unable complete adequate screening. Given anemia GI reccs EGD and colonoscopy at some point  - given risk for recurrence recommend sigmoidectomy during admission. Monitor anemia - patient could benefit from colonoscopy prior to sigmoidectomy - GI following.   Discussed with patient we recommend to continue to monitor for recurrence and tentatively plan for OR early next week after bowel prep- however patient is very concerned about timing due to work commitments and states it would be prohibitive to his work to be in the hospital next week and would prefer discharge and surgery later. He will discuss with wife. Will discuss with attending   FEN: NPO ID: none VTE: chemical prophylaxis okay from general surgery perspective   Moderate Medical Decision Making   LOS: 1 day   Winferd Humphrey, [facility]  Surgery 03/08/2021, 8:17 AM Please see Amion for pager number during day hours 7:00am-4:30pm

## 2021-03-08 NOTE — Progress Notes (Signed)
[facility] Progress Note  CC:  Sigmoid volvulus   Subjective: He feels well this morning. Ambulating up in the room. No abdominal pain, nausea or vomiting. Not passing any gas per the rectum, no BM. Remains NPO.  Objective:   Flex sig 03/07/2021: - Preparation of the colon was poor, stool in the entire examined colon. - Volvulus with some mildly pale mucosa but no other overt ischemic change. Decompression performed with placement of rectal tube which was secured. - The examination was otherwise normal of what visualized but due to prep, inadequate for assessing for polyps, etc.  Vital signs in last 24 hours: Temp:  [96.8 F (36 C)-98.4 F (36.9 C)] 98.2 F (36.8 C) (01/06 0543) Pulse Rate:  [73-105] 73 (01/06 0543) Resp:  [14-25] 18 (01/06 0543) BP: (121-179)/(63-112) 121/63 (01/06 0543) SpO2:  [98 %-100 %] 98 % (01/06 0543) Weight:  [89.8 kg-90.3 kg] 89.8 kg (01/05 2026) Last BM Date: 03/03/21 (here for bowel decompression- sigmoidoscopy completed PTA) General:   Alert 76 year old male in NAD. Heart: RRR, no murmur.  Pulm: Breath sounds clear throughout. Chest : Right subclavian medi port intact.  Abdomen: Abdomen much less distended today, soft, hypoactive BS x 4 quads. Nontender.  Extremities:  Without edema. Neurologic:  Alert and  oriented x4. Grossly normal neurologically. Psych:  Alert and cooperative. Normal mood and affect.  Intake/Output from previous day: 01/05 0701 - 01/06 0700 In: 1411.2 [I.V.:711.2; IV Piggyback:700] Out: -  Intake/Output this shift: No intake/output data recorded.  Lab Results: Recent Labs    03/07/21 0915 03/08/21 0320  WBC 8.1 7.3  HGB 12.8* 10.8*  HCT 38.1* 31.5*  PLT 242.0 198   BMET Recent Labs    03/07/21 1444 03/08/21 0320  NA 136 137   137  K 2.3* <2.0*   <2.0*  CL 104 102   101  CO2 _0 GLUCOSE 117* 99   99  BUN _1 CREATININE 0.71 0.71   0.68  CALCIUM 9.2 8.2*   8.1*    LFT Recent Labs    03/08/21 0320  PROT 5.4*  ALBUMIN 3.0*   3.0*  AST 18  ALT 16  ALKPHOS 45  BILITOT 1.4*   PT/INR No results for input(s): LABPROT, INR in the last 72 hours. Hepatitis Panel No results for input(s): HEPBSAG, HCVAB, HEPAIGM, HEPBIGM in the last 72 hours.  CT Abdomen Pelvis W Contrast  Result Date: 03/07/2021 CLINICAL DATA:  Abdominal pain EXAM: CT ABDOMEN AND PELVIS WITH CONTRAST TECHNIQUE: Multidetector CT imaging of the abdomen and pelvis was performed using the standard protocol following bolus administration of intravenous contrast. CONTRAST:  135m ISOVUE-300 IOPAMIDOL (ISOVUE-300) INJECTION 61% COMPARISON:  02/01/2020 FINDINGS: Lower chest: Subtle increased markings are seen in the left lower lung fields. There is elevation of left hemidiaphragm. Hepatobiliary: Liver measures 19.7 cm in length. There are few low-density lesions in the liver each measuring less than 1.3 cm with no significant change, possibly suggesting cysts or hemangiomas. In image 20 of series 2, there is an ill-defined 2.1 cm area of low attenuation in the right lobe of liver. Gallbladder is unremarkable. Pancreas: No focal abnormality is seen. Spleen: Spleen measures 13.4 cm in maximum diameter. Adrenals/Urinary Tract: There is mild nodularity in the adrenals. There is no hydronephrosis. There are no renal or ureteral stones. There is 2 cm smooth marginated low-density lesion in the upper pole of left kidney suggesting renal  cysts. Urinary bladder is unremarkable. Stomach/Bowel: Small hiatal hernia is seen. Stomach is not distended. Small bowel loops are not dilated. Appendix is not dilated. There is no significant wall thickening in the colon. There is swirling of mesenteric vessels in the lower abdomen. There is abnormal dilation of portion of sigmoid colon. There is incomplete distention of rectosigmoid. Vascular/Lymphatic: There are scattered atherosclerotic plaques and calcifications. No  significant lymphadenopathy seen. Reproductive: Unremarkable. Other: There is no pneumoperitoneum. There is small amount of free fluid in the pelvic cavity, possibly related to acute sigmoid volvulus. Small umbilical hernia containing fat is seen. Musculoskeletal: Degenerative changes are noted in the lumbar spine, particularly severe at L4-L5 level with spinal stenosis and encroachment of neural foramina. There are patchy areas of sclerosis in the vertebral bodies, possibly due to disc degeneration. IMPRESSION: There is sigmoid volvulus. Surgical consultation should be considered. There is no significant small bowel obstruction. There is no ascites or pneumoperitoneum. There is no hydronephrosis. Appendix is not dilated. Small hiatal hernia. There are few low-density foci in the liver, possibly cysts or hemangiomas. There is an ill-defined 2.1 cm low-attenuation in the right lobe of liver which may be beam hardening artifact or space-occupying lesion. Short-term follow-up multiphasic CT may be considered. Enlarged liver and spleen. Imaging finding of sigmoid volvulus was relayed to Dr. Ronnald Ramp by telephone call. Electronically Signed   By: Elmer Picker M.D.   On: 03/07/2021 12:06   DG ABD ACUTE 2+V W 1V CHEST  Addendum Date: 03/07/2021   ADDENDUM REPORT: 03/07/2021 09:46 ADDENDUM: These results were called by telephone at the time of interpretation on 03/07/2021 at 9:46 am to provider Scarlette Calico , who verbally acknowledged these results. Electronically Signed   By: Markus Daft M.D.   On: 03/07/2021 09:46   Result Date: 03/07/2021 CLINICAL DATA:  Generalized abdominal tenderness without rebound tenderness. Severe constipation. EXAM: DG ABDOMEN ACUTE WITH 1 VIEW CHEST COMPARISON:  PET-CT 01/31/2021 and chest radiograph 12/27/2019 and CT abdomen 02/01/2020 FINDINGS: Stable position of the right jugular Port-A-Cath with the tip near the superior cavoatrial junction. Heart size is normal. Lungs are clear. Large  dilated bowel loop in the mid upper abdomen. The configuration raises concern for a potential sigmoid volvulus. There is stool throughout the transverse and right colon. No evidence for free air. Patchy areas of sclerosis throughout the pelvic bones compatible with osseous metastatic disease. IMPRESSION: 1. Large dilated loop of bowel in the upper midline of the abdomen. Review of prior CT imaging demonstrates sigmoid colon in this region. Configuration of the dilated sigmoid colon raises concern for a possible volvulus. Recommend further characterization with a CT of the abdomen and pelvis. 2. Moderate to large amount of stool in the abdomen. 3. No acute chest abnormality. 4. Osseous metastatic disease. Electronically Signed: By: Markus Daft M.D. On: 03/07/2021 09:36   DG Abd Portable 1V  Result Date: 03/07/2021 CLINICAL DATA:  Endoscopic decompression of sigmoid volvulus EXAM: PORTABLE ABDOMEN - 1 VIEW COMPARISON:  03/07/2021 CT and x-ray FINDINGS: Supine frontal view of the abdomen and pelvis was performed, excluding the hemidiaphragms by collimation. Rectal tube overlies the distal rectosigmoid colon. There has been marked decompression of the gaseous distension of the sigmoid colon since prior study. No evidence of bowel obstruction or ileus. Moderate residual stool within the colon unchanged. No masses or abnormal calcifications. Sclerotic metastases throughout the spine, bony pelvis, and hips from known prostate cancer. IMPRESSION: 1. Interval decompression of the previous sigmoid volvulus, with indwelling  rectal tube overlying the distal rectosigmoid colon. 2. Moderate fecal retention. 3. Stable bony metastases from prostate cancer. These results were discussed by telephone at the time of interpretation on 03/07/2021 at 6:02 pm to provider [facility] , who verbally acknowledged these results. Electronically Signed   By: Randa Ngo M.D.   On: 03/07/2021 18:14    Assessment / Plan:  81)  76 year old male with abdominal pain x 4 days, no bowel movement x 5 days.  CTAP at outpatient radiology today showed evidence of a sigmoid volvulus.  He was sent directly to the ED by his PCP for further evaluation. S/P flexible sigmoidoscopy 1/5 identified stool in the entire colon, sigmoid volvulus with some mildly pale mucosa without overt ischemic change and decompression performed with placement of rectal tube which was secured. Significantly less abdominal distension today, not passing any gas per the rectum. Seen by general surgery, recommended possible sigmoidectomy to prevent volvulus recurrence. To consider a colonoscopy during this admission prior to surgery. 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  -Appreciate general surgery recommendations  -CTAP without contrast if abdominal distension recurs -Remove rectal tube 6pm today if he is doing better -Goal K+ level > 4 < 5. Magnesium > 2, KCL replacement ordered per the medical service -Clear liquid diet -Ambulate as tolerated  -Colonoscopy after volvulus resolves, inpatient vs outpatient, await further recommendations per Dr. Havery Moros   2) IDA. Hg 12.8 -> 10.8. MCV 91.8. No overt  GI bleeding. Iron 30.  -Eventual EGD and colonoscopy, inpatient vs outpatient -CBC in am -Consider IV iron during hospital admission   3) Hypokalemia. K+ 2.3 -> 2.0. KCL 53mq IV X 6 ordered by the medical service  4) CTAP identified several low-density liver lesions and an ill-defined 2.1 cm right liver lobe lesion.  Elevated T. Bili 1.4. Normal Alk phos, AST/ALT levels.  -Recommend abdominal MRI with and without contrast as an outpatient -Hepatic panel in am   5) History of prostate cancer with bone metastasis followed by Dr. SAlen Blew Last chemotherapy was in 2017.  Right subclavian Mediport remains intact.  6) GERD symptoms.  Patient reported having heartburn for a few days after the onset of his recent abdominal distention which abated 2 days ago. No prior issues with GERD.  -Pantoprazole 4106mIV QD      Principal Problem:   Volvulus (HCC) Active Problems:   Iron deficiency anemia   Sigmoid volvulus (HCC)   Abnormal finding on GI tract imaging     LOS: 1 day   CoNoralyn Pick1/08/2021, 8:49 AM

## 2021-03-08 NOTE — Discharge Instructions (Signed)
You will need to follow up with a surgeon at [facility]. The office phone number is [phone]. If you have not heard from them within a few days of discharge to schedule an appointment, please call. The address is [address] [place], [place], [place] [address].

## 2021-03-09 DIAGNOSIS — K562 Volvulus: Secondary | ICD-10-CM | POA: Diagnosis not present

## 2021-03-09 LAB — HEPATIC FUNCTION PANEL
ALT: 15 U/L (ref 0–44)
AST: 20 U/L (ref 15–41)
Albumin: 3 g/dL — ABNORMAL LOW (ref 3.5–5.0)
Alkaline Phosphatase: 43 U/L (ref 38–126)
Bilirubin, Direct: 0.2 mg/dL (ref 0.0–0.2)
Indirect Bilirubin: 0.7 mg/dL (ref 0.3–0.9)
Total Bilirubin: 0.9 mg/dL (ref 0.3–1.2)
Total Protein: 5.6 g/dL — ABNORMAL LOW (ref 6.5–8.1)

## 2021-03-09 LAB — BASIC METABOLIC PANEL
Anion gap: 7 (ref 5–15)
BUN: 12 mg/dL (ref 8–23)
CO2: 24 mmol/L (ref 22–32)
Calcium: 7.8 mg/dL — ABNORMAL LOW (ref 8.9–10.3)
Chloride: 103 mmol/L (ref 98–111)
Creatinine, Ser: 0.65 mg/dL (ref 0.61–1.24)
GFR, Estimated: >60 mL/min (ref >60)
Glucose, Bld: 106 mg/dL — ABNORMAL HIGH (ref 70–99)
Potassium: 3 mmol/L — ABNORMAL LOW (ref 3.5–5.1)
Sodium: 134 mmol/L — ABNORMAL LOW (ref 135–145)

## 2021-03-09 LAB — POTASSIUM: Potassium: 3.9 mmol/L (ref 3.5–5.1)

## 2021-03-09 LAB — CBC
HCT: 30.7 % — ABNORMAL LOW (ref 39.0–52.0)
Hemoglobin: 10.6 g/dL — ABNORMAL LOW (ref 13.0–17.0)
MCH: 31.5 pg (ref 26.0–34.0)
MCHC: 34.5 g/dL (ref 30.0–36.0)
MCV: 91.4 fL (ref 80.0–100.0)
Platelets: 212 10*3/uL (ref 150–400)
RBC: 3.36 MIL/uL — ABNORMAL LOW (ref 4.22–5.81)
RDW: 12.6 % (ref 11.5–15.5)
WBC: 7 10*3/uL (ref 4.0–10.5)
nRBC: 0 % (ref 0.0–0.2)

## 2021-03-09 LAB — MAGNESIUM: Magnesium: 2 mg/dL (ref 1.7–2.4)

## 2021-03-09 MED ORDER — FERROUS SULFATE 325 (65 FE) MG PO TABS: 325.0000 mg | ORAL_TABLET | Freq: Every day | ORAL | 2 refills | Status: DC

## 2021-03-09 MED ORDER — POLYETHYLENE GLYCOL 3350 17 G PO PACK: 17.0000 g | PACK | Freq: Every day | ORAL | 2 refills | Status: DC

## 2021-03-09 MED ORDER — POTASSIUM CHLORIDE CRYS ER 20 MEQ PO TBCR: 40.0000 meq | EXTENDED_RELEASE_TABLET | ORAL | Status: DC

## 2021-03-09 MED ORDER — HEPARIN SOD (PORK) LOCK FLUSH 100 UNIT/ML IV SOLN
500.0000 [IU] | INTRAVENOUS | Status: AC | PRN
  Administered 2021-03-09: 500 [IU]
  Filled 2021-03-09: qty 5

## 2021-03-09 MED ORDER — POTASSIUM CHLORIDE CRYS ER 20 MEQ PO TBCR
40.0000 meq | EXTENDED_RELEASE_TABLET | ORAL | Status: AC
  Administered 2021-03-09 (×3): 40 meq via ORAL
  Filled 2021-03-09 (×2): qty 2

## 2021-03-09 NOTE — Progress Notes (Signed)
A consult was placed to the IV Therapist to de-access the portacath;  pt concerned that he has labwork to be drawn later this afternoon, and if the port is deaccessed, he will need to be "stuck" by the lab;  will leave port accessed until labs are drawn later and the pt has a discharge order written.

## 2021-03-09 NOTE — Plan of Care (Signed)

## 2021-03-09 NOTE — Discharge Summary (Signed)
Physician Discharge Summary  Yigit Norkus WUJ:[id number] DOB: 02/15/1946 DOA: 03/07/2021  PCP: [name], MD  Admit date: 03/07/2021 Discharge date: 03/09/2021  Admitted From: Home Disposition: Home  Recommendations for Outpatient Follow-up:  Follow up with PCP in 1-2 weeks Follow-up with general surgery and [facility] in 1-2 weeks Discharged with MiraLAX daily and ferrous sulfate 325 mg p.o. daily Please obtain BMP in 1 week to reassess potassium level Will need MR abdomen with and without contrast outpatient for further work-up of liver lesions  Home Health: No Equipment/Devices: None  Discharge Condition: Stable CODE STATUS: Full code Diet recommendation: Soft diet  History of present illness:  Philip [name] is a 76 year old male with past medical history significant for essential hypertension, hyperlipidemia, prostate cancer who presented to [facility] ED at the advisement of his PCP following abnormal imaging studies with complaints of constipation and abdominal pain.  Onset 4 days ago with worsening abdominal pain/distention over the last 2 days.  Also associated with decreased appetite with nausea and no vomiting.  Outpatient CT abdomen/pelvis notable for volvulus and was referred to the ED for further evaluation.   In the ED, temperature 97.9 F, HR 105, RR 16, BP 150/112, SPO2 98% on room air.  Sodium 136, potassium 2.3, chloride 104, CO2 25, glucose 117, BUN 18, creatinine 0.71.  Alkaline phosphatase 54, AST 21, ALT 19, total bilirubin 1.1.  Lipase 25.0.  WBC 8.1, hemoglobin 12.8, platelets 242.  TSH 1.87.  Influenza A/B PCR negative.  COVID-19 PCR negative.  Urinalysis unrevealing.  Abdominal x-ray with large dilated loop of bowel upper midline of the abdomen concerning for possible volvulus, moderate amount of stool in the abdomen, osseous metastatic disease.  CT abdomen/pelvis with sigmoid volvulus, no significant small bowel obstruction, no ascites or pneumoperitoneum, ill-defined 2.1  cm low-attenuation right lobe of liver.  Gastroenterology was consulted.  Hospital service consulted for further evaluation and management.  Hospital course:  Sigmoid volvulus Patient presenting to the ED with 4-day history of progressive abdominal pain associated with nausea and constipation.  Imaging outpatient notable for sigmoid volvulus.  Underwent flexible sigmoidoscopy by gastroenterology, Dr. Havery Moros on 1/5 with decompression and placement of rectal tube.  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  Patient was instructed on the risks of holding off surgery with potential recurrence rates in the 40-60%.  Patient was instructed regarding warning signs and need to return emergently to the ER for evaluation if recurrence of symptoms.  Will need close follow-up with gastroenterology for colonoscopy/EGD and general surgery outpatient to schedule sigmoidectomy.  MiraLAX once daily.   Iron deficiency anemia No overt GI bleeding, patient states this is well known and likely secondary to his previous chemotherapy.  Anemia panel with iron 30, TIBC 375.2, ferritin 165.1, folate greater than 24.2, B12 528.  Ferrous sulfate 325 mg p.o. daily.   Hypokalemia Potassium less than 2.0 on admission.  Repleted aggressively during hospitalization with IV and oral potassium.  Potassium improved to 3.9 at time of discharge.  May resume home potassium supplementation.  Recommend repeat BMP 1 week.   Liver lesion Incidental finding on CT abdomen/pelvis of low-density liver lesion and ill-defined 2.1 cm right liver lobe lesion.  Will need abdominal MRI with and without contrast as outpatient.   Essential hypertension Continue home olmesartan.   Hyperlipidemia: Continue statin   Hx  prostate cancer with bony metastasis Follows with  medical oncology, Dr. Alen Blew.  Last chemotherapy 2017.   GERD: Continue PPI.  Discharge Diagnoses:  Active Problems:   Iron deficiency anemia   Abnormal finding on GI tract imaging    Discharge Instructions  Discharge Instructions     Call MD for:  difficulty breathing, headache or visual disturbances   Complete by: As directed    Call MD for:  extreme fatigue   Complete by: As directed    Call MD for:  persistant dizziness or light-headedness   Complete by: As directed    Call MD for:  persistant nausea and vomiting   Complete by: As directed    Call MD for:  severe uncontrolled pain   Complete by: As directed    Call MD for:  temperature >100.4   Complete by: As directed    Diet - low sodium heart healthy   Complete by: As directed    Increase activity slowly   Complete by: As directed       Allergies as of 03/09/2021       Reactions   Prednisone Anxiety        Medication List     TAKE these medications    abiraterone acetate 250 MG tablet Commonly known as: Zytiga TAKE 4 TABLETS BY MOUTH ONCE DAILY AS DIRECTED.  TAKE 1 HOUR BEFORE OR 2 HOURS AFTER A MEAL   atorvastatin 10 MG tablet Commonly known as: LIPITOR TAKE 1 TABLET BY MOUTH EVERY DAY   BIOTIN 5000 PO Take 1 tablet by mouth daily.   CALCIUM-VITAMIN D PO Take 1 tablet by mouth daily.   ferrous sulfate 325 (65 FE) MG tablet Take 1 tablet (325 mg total) by mouth daily with breakfast. Start taking on: March 10, 2021   fexofenadine 180 MG tablet Commonly known as: ALLEGRA Take 180 mg by mouth daily.   hydrOXYzine 10 MG tablet Commonly known as: ATARAX Take 10 mg by mouth in the morning and at bedtime.   indapamide 1.25 MG tablet Commonly known as: LOZOL TAKE 1 TABLET BY MOUTH EVERY DAY   Klor-Con M20 20 MEQ tablet Generic drug: potassium chloride SA TAKE 1 TABLET BY MOUTH TWICE A DAY   lidocaine-prilocaine cream Commonly known as: EMLA Apply 1 application topically as needed. What  changed: reasons to take this   LORazepam 1 MG tablet Commonly known as: ATIVAN TAKE 1 TABLET BY MOUTH EVERY 8 HOURS AS NEEDED FOR ANXIETY What changed:  when to take this additional instructions   multivitamin tablet Take 1 tablet by mouth daily.   olmesartan 20 MG tablet Commonly known as: BENICAR Take 1 tablet (20 mg total) by mouth daily.   polyethylene glycol 17 g packet Commonly known as: MIRALAX / GLYCOLAX Take 17 g by mouth daily.   traMADol 50 MG tablet Commonly known as: ULTRAM Take 1 tablet (50 mg total) by mouth every 12 (twelve) hours as needed for moderate pain.   XGEVA Bantry Inject into the skin.        Follow-up Information     [facility] Follow up.   Specialty: General Surgery Why: contact our office if you haven't heard from them about surgery scheduling by mid next week Contact information: [place] [address] [phone]         Yetta Flock, MD. Schedule an appointment as soon as possible for a visit in 2 week(s).   Specialty: Gastroenterology Contact  information: [address] [place] [address] [phone]         [name], MD. Schedule an appointment as soon as possible for a visit in 1 week(s).   Specialty: Internal Medicine Contact information: [place] [address] [phone]                Allergies  Allergen Reactions   Prednisone Anxiety    Consultations: [facility] General surgery   Procedures/Studies: CT Abdomen Pelvis W Contrast  Result Date: 03/07/2021 CLINICAL DATA:  Abdominal pain EXAM: CT ABDOMEN AND PELVIS WITH CONTRAST TECHNIQUE: Multidetector CT imaging of the abdomen and pelvis was performed using the standard protocol following bolus administration of intravenous contrast. CONTRAST:  146mL ISOVUE-300 IOPAMIDOL (ISOVUE-300) INJECTION 61% COMPARISON:  02/01/2020 FINDINGS: Lower chest: Subtle increased markings  are seen in the left lower lung fields. There is elevation of left hemidiaphragm. Hepatobiliary: Liver measures 19.7 cm in length. There are few low-density lesions in the liver each measuring less than 1.3 cm with no significant change, possibly suggesting cysts or hemangiomas. In image 20 of series 2, there is an ill-defined 2.1 cm area of low attenuation in the right lobe of liver. Gallbladder is unremarkable. Pancreas: No focal abnormality is seen. Spleen: Spleen measures 13.4 cm in maximum diameter. Adrenals/Urinary Tract: There is mild nodularity in the adrenals. There is no hydronephrosis. There are no renal or ureteral stones. There is 2 cm smooth marginated low-density lesion in the upper pole of left kidney suggesting renal cysts. Urinary bladder is unremarkable. Stomach/Bowel: Small hiatal hernia is seen. Stomach is not distended. Small bowel loops are not dilated. Appendix is not dilated. There is no significant wall thickening in the colon. There is swirling of mesenteric vessels in the lower abdomen. There is abnormal dilation of portion of sigmoid colon. There is incomplete distention of rectosigmoid. Vascular/Lymphatic: There are scattered atherosclerotic plaques and calcifications. No significant lymphadenopathy seen. Reproductive: Unremarkable. Other: There is no pneumoperitoneum. There is small amount of free fluid in the pelvic cavity, possibly related to acute sigmoid volvulus. Small umbilical hernia containing fat is seen. Musculoskeletal: Degenerative changes are noted in the lumbar spine, particularly severe at L4-L5 level with spinal stenosis and encroachment of neural foramina. There are patchy areas of sclerosis in the vertebral bodies, possibly due to disc degeneration. IMPRESSION: There is sigmoid volvulus. Surgical consultation should be considered. There is no significant small bowel obstruction. There is no ascites or pneumoperitoneum. There is no hydronephrosis. Appendix is not  dilated. Small hiatal hernia. There are few low-density foci in the liver, possibly cysts or hemangiomas. There is an ill-defined 2.1 cm low-attenuation in the right lobe of liver which may be beam hardening artifact or space-occupying lesion. Short-term follow-up multiphasic CT may be considered. Enlarged liver and spleen. Imaging finding of sigmoid volvulus was relayed to Dr. Ronnald Ramp by telephone call. Electronically Signed   By: Elmer Picker M.D.   On: 03/07/2021 12:06   DG ABD ACUTE 2+V W 1V CHEST  Addendum Date: 03/07/2021   ADDENDUM REPORT: 03/07/2021 09:46 ADDENDUM: These results were called by telephone at the time of interpretation on 03/07/2021 at 9:46 am to provider Scarlette Calico , who verbally acknowledged these results. Electronically Signed   By: Markus Daft M.D.   On: 03/07/2021 09:46   Result Date: 03/07/2021 CLINICAL DATA:  Generalized abdominal tenderness without rebound tenderness. Severe constipation. EXAM: DG ABDOMEN ACUTE WITH 1 VIEW CHEST COMPARISON:  PET-CT 01/31/2021 and  chest radiograph 12/27/2019 and CT abdomen 02/01/2020 FINDINGS: Stable position of the right jugular Port-A-Cath with the tip near the superior cavoatrial junction. Heart size is normal. Lungs are clear. Large dilated bowel loop in the mid upper abdomen. The configuration raises concern for a potential sigmoid volvulus. There is stool throughout the transverse and right colon. No evidence for free air. Patchy areas of sclerosis throughout the pelvic bones compatible with osseous metastatic disease. IMPRESSION: 1. Large dilated loop of bowel in the upper midline of the abdomen. Review of prior CT imaging demonstrates sigmoid colon in this region. Configuration of the dilated sigmoid colon raises concern for a possible volvulus. Recommend further characterization with a CT of the abdomen and pelvis. 2. Moderate to large amount of stool in the abdomen. 3. No acute chest abnormality. 4. Osseous metastatic disease.  Electronically Signed: By: Markus Daft M.D. On: 03/07/2021 09:36   DG Abd Portable 1V  Result Date: 03/07/2021 CLINICAL DATA:  Endoscopic decompression of sigmoid volvulus EXAM: PORTABLE ABDOMEN - 1 VIEW COMPARISON:  03/07/2021 CT and x-ray FINDINGS: Supine frontal view of the abdomen and pelvis was performed, excluding the hemidiaphragms by collimation. Rectal tube overlies the distal rectosigmoid colon. There has been marked decompression of the gaseous distension of the sigmoid colon since prior study. No evidence of bowel obstruction or ileus. Moderate residual stool within the colon unchanged. No masses or abnormal calcifications. Sclerotic metastases throughout the spine, bony pelvis, and hips from known prostate cancer. IMPRESSION: 1. Interval decompression of the previous sigmoid volvulus, with indwelling rectal tube overlying the distal rectosigmoid colon. 2. Moderate fecal retention. 3. Stable bony metastases from prostate cancer. These results were discussed by telephone at the time of interpretation on 03/07/2021 at 6:02 pm to provider [facility] , who verbally acknowledged these results. Electronically Signed   By: Randa Ngo M.D.   On: 03/07/2021 18:14     Subjective: Patient seen examined at bedside, resting comfortably.  Sitting at edge of bed.  Tolerating breakfast.  Continues to decline any further work-up to include EGD/colonoscopy and sigmoidectomy during the hospitalization and wishes outpatient follow-up.  Seen by GI and general surgery and has been given warning signs for return precautions and need close outpatient follow-up after discharge.  Patient states his symptoms have resolved and is having bowel movements.  No other questions or concerns at this time.  Denies headache, no fever/chills/night sweats, no nausea/vomiting/diarrhea, no chest pain, no palpitations, no shortness of breath, no abdominal pain, no cough/congestion, no weakness, no fatigue, no paresthesias.  No  acute events overnight per nursing staff.  Discharge Exam: Vitals:   03/09/21 0512 03/09/21 1316  BP: (!) 143/74 (!) 153/90  Pulse: 78 75  Resp: 16 18  Temp: 97.9 F (36.6 C) 97.8 F (36.6 C)  SpO2: 99% 98%   Vitals:   03/08/21 1256 03/08/21 2020 03/09/21 0512 03/09/21 1316  BP: (!) 154/93 (!) 189/91 (!) 143/74 (!) 153/90  Pulse: 81 85 78 75  Resp: 16 18 16 18   Temp: (!) 97.3 F (36.3 C) 98 F (36.7 C) 97.9 F (36.6 C) 97.8 F (36.6 C)  TempSrc:      SpO2: 100% 100% 99% 98%  Weight:      Height:        General: Pt is alert, awake, not in acute distress Cardiovascular: RRR, S1/S2 +, no rubs, no gallops, right chest port noted Respiratory: CTA bilaterally, no wheezing, no rhonchi, on room air Abdominal: Soft, NT, ND, bowel sounds +  Extremities: no edema, no cyanosis    The results of significant diagnostics from this hospitalization (including imaging, microbiology, ancillary and laboratory) are listed below for reference.     Microbiology: Recent Results (from the past 240 hour(s))  Resp Panel by RT-PCR (Flu A&B, Covid) Nasopharyngeal Swab     Status: None   Collection Time: 03/07/21  6:30 PM   Specimen: Nasopharyngeal Swab; Nasopharyngeal(NP) swabs in vial transport medium  Result Value Ref Range Status   SARS Coronavirus 2 by RT PCR NEGATIVE NEGATIVE Final    Comment: (NOTE) SARS-CoV-2 target nucleic acids are NOT DETECTED.  The SARS-CoV-2 RNA is generally detectable in upper respiratory specimens during the acute phase of infection. The lowest concentration of SARS-CoV-2 viral copies this assay can detect is 138 copies/mL. A negative result does not preclude SARS-Cov-2 infection and should not be used as the sole basis for treatment or other patient management decisions. A negative result may occur with  improper specimen collection/handling, submission of specimen other than nasopharyngeal swab, presence of viral mutation(s) within the areas targeted by  this assay, and inadequate number of viral copies(<138 copies/mL). A negative result must be combined with clinical observations, patient history, and epidemiological information. The expected result is Negative.  Fact Sheet for Patients:  EntrepreneurPulse.com.au  Fact Sheet for Healthcare Providers:  IncredibleEmployment.be  This test is no t yet approved or cleared by the [place] FDA and  has been authorized for detection and/or diagnosis of SARS-CoV-2 by FDA under an Emergency Use Authorization (EUA). This EUA will remain  in effect (meaning this test can be used) for the duration of the COVID-19 declaration under Section 564(b)(1) of the Act, 21 U.S.C.section 360bbb-3(b)(1), unless the authorization is terminated  or revoked sooner.       Influenza A by PCR NEGATIVE NEGATIVE Final   Influenza B by PCR NEGATIVE NEGATIVE Final    Comment: (NOTE) The Xpert Xpress SARS-CoV-2/FLU/RSV plus assay is intended as an aid in the diagnosis of influenza from Nasopharyngeal swab specimens and should not be used as a sole basis for treatment. Nasal washings and aspirates are unacceptable for Xpert Xpress SARS-CoV-2/FLU/RSV testing.  Fact Sheet for Patients: EntrepreneurPulse.com.au  Fact Sheet for Healthcare Providers: IncredibleEmployment.be  This test is not yet approved or cleared by the [place] FDA and has been authorized for detection and/or diagnosis of SARS-CoV-2 by FDA under an Emergency Use Authorization (EUA). This EUA will remain in effect (meaning this test can be used) for the duration of the COVID-19 declaration under Section 564(b)(1) of the Act, 21 U.S.C. section 360bbb-3(b)(1), unless the authorization is terminated or revoked.  Performed at [facility], [place] [address] [place], [place] [address]      Labs: BNP (last 3 results) No results for input(s): BNP in  the last 8760 hours. Basic Metabolic Panel: Recent Labs  Lab 03/07/21 0915 03/07/21 1444 03/07/21 1444 03/08/21 0320 03/08/21 1210 03/08/21 2201 03/09/21 0200 03/09/21 1700  NA  --  136  --  137   137  --   --  134*  --   K  --  2.3*   < > <2.0*   <2.0* 2.8* 2.8* 3.0* 3.9  CL  --  104  --  102   101  --   --  103  --   CO2  --  25  --  27   27  --   --  24  --   GLUCOSE  --  117*  --  99   99  --   --  106*  --   BUN  --  18  --  14   14  --   --  12  --   CREATININE  --  0.71  --  0.71   0.68  --   --  0.65  --   CALCIUM  --  9.2  --  8.2*   8.1*  --   --  7.8*  --   MG 1.9 2.0  --   --   --   --  2.0  --   PHOS  --   --   --  2.6  --   --   --   --    < > = values in this interval not displayed.   Liver Function Tests: Recent Labs  Lab 03/07/21 1444 03/08/21 0320 03/09/21 0200  AST 21 18 20   ALT 19 16 15   ALKPHOS 54 45 43  BILITOT 1.1 1.4* 0.9  PROT 7.0 5.4* 5.6*  ALBUMIN 4.0 3.0*   3.0* 3.0*   Recent Labs  Lab 03/07/21 0915  LIPASE 25.0  AMYLASE 42   No results for input(s): AMMONIA in the last 168 hours. CBC: Recent Labs  Lab 03/07/21 0915 03/08/21 0320 03/09/21 0200  WBC 8.1 7.3 7.0  NEUTROABS 5.7  --   --   HGB 12.8* 10.8* 10.6*  HCT 38.1* 31.5* 30.7*  MCV 91.8 91.8 91.4  PLT 242.0 198 212   Cardiac Enzymes: No results for input(s): CKTOTAL, CKMB, CKMBINDEX, TROPONINI in the last 168 hours. BNP: Invalid input(s): POCBNP CBG: No results for input(s): GLUCAP in the last 168 hours. D-Dimer No results for input(s): DDIMER in the last 72 hours. Hgb A1c No results for input(s): HGBA1C in the last 72 hours. Lipid Profile No results for input(s): CHOL, HDL, LDLCALC, TRIG, CHOLHDL, LDLDIRECT in the last 72 hours. Thyroid function studies Recent Labs    03/07/21 0915  TSH 1.87   Anemia work up Recent Labs    03/07/21 0915  VITAMINB12 528  FOLATE >24.2  FERRITIN 165.1  TIBC 375.2  IRON 30*   Urinalysis    Component Value Date/Time    COLORURINE YELLOW 03/07/2021 1825   APPEARANCEUR CLOUDY (A) 03/07/2021 1825   LABSPEC 1.034 (H) 03/07/2021 1825   PHURINE 7.0 03/07/2021 1825   GLUCOSEU NEGATIVE 03/07/2021 1825   HGBUR NEGATIVE 03/07/2021 1825   BILIRUBINUR NEGATIVE 03/07/2021 1825   KETONESUR 20 (A) 03/07/2021 1825   PROTEINUR NEGATIVE 03/07/2021 1825   NITRITE NEGATIVE 03/07/2021 1825   LEUKOCYTESUR NEGATIVE 03/07/2021 1825   Sepsis Labs Invalid input(s): PROCALCITONIN,  WBC,  LACTICIDVEN Microbiology Recent Results (from the past 240 hour(s))  Resp Panel by RT-PCR (Flu A&B, Covid) Nasopharyngeal Swab     Status: None   Collection Time: 03/07/21  6:30 PM   Specimen: Nasopharyngeal Swab; Nasopharyngeal(NP) swabs in vial transport medium  Result Value Ref Range Status   SARS Coronavirus 2 by RT PCR NEGATIVE NEGATIVE Final    Comment: (NOTE) SARS-CoV-2 target nucleic acids are NOT DETECTED.  The SARS-CoV-2 RNA is generally detectable in upper respiratory specimens during the acute phase of infection. The lowest concentration of SARS-CoV-2 viral copies this assay can detect is 138 copies/mL. A negative result does not preclude SARS-Cov-2 infection and should not be used as the sole basis for treatment or other patient management decisions. A negative result may occur with  improper specimen collection/handling,  submission of specimen other than nasopharyngeal swab, presence of viral mutation(s) within the areas targeted by this assay, and inadequate number of viral copies(<138 copies/mL). A negative result must be combined with clinical observations, patient history, and epidemiological information. The expected result is Negative.  Fact Sheet for Patients:  EntrepreneurPulse.com.au  Fact Sheet for Healthcare Providers:  IncredibleEmployment.be  This test is no t yet approved or cleared by the [place] FDA and  has been authorized for detection and/or diagnosis of  SARS-CoV-2 by FDA under an Emergency Use Authorization (EUA). This EUA will remain  in effect (meaning this test can be used) for the duration of the COVID-19 declaration under Section 564(b)(1) of the Act, 21 U.S.C.section 360bbb-3(b)(1), unless the authorization is terminated  or revoked sooner.       Influenza A by PCR NEGATIVE NEGATIVE Final   Influenza B by PCR NEGATIVE NEGATIVE Final    Comment: (NOTE) The Xpert Xpress SARS-CoV-2/FLU/RSV plus assay is intended as an aid in the diagnosis of influenza from Nasopharyngeal swab specimens and should not be used as a sole basis for treatment. Nasal washings and aspirates are unacceptable for Xpert Xpress SARS-CoV-2/FLU/RSV testing.  Fact Sheet for Patients: EntrepreneurPulse.com.au  Fact Sheet for Healthcare Providers: IncredibleEmployment.be  This test is not yet approved or cleared by the [place] FDA and has been authorized for detection and/or diagnosis of SARS-CoV-2 by FDA under an Emergency Use Authorization (EUA). This EUA will remain in effect (meaning this test can be used) for the duration of the COVID-19 declaration under Section 564(b)(1) of the Act, 21 U.S.C. section 360bbb-3(b)(1), unless the authorization is terminated or revoked.  Performed at [facility], [place] [address], [place], [place] [address]      Time coordinating discharge: Over 30 minutes  SIGNED:   Cristopher Ciccarelli J [place] DO  [facility] 03/09/2021, 5:39 PM

## 2021-03-09 NOTE — Progress Notes (Addendum)
Progress Note  2 Days Post-Op  Subjective: No complaints.  6 BMs.  No nausea.  Tolerating CLD.  Still refusing surgery during this admission despite knowing risks and wanting to go home.   Objective: Vital signs in last 24 hours: Temp:  [97.3 F (36.3 C)-98 F (36.7 C)] 97.9 F (36.6 C) (01/07 0512) Pulse Rate:  [78-85] 78 (01/07 0512) Resp:  [16-18] 16 (01/07 0512) BP: (143-189)/(74-93) 143/74 (01/07 0512) SpO2:  [99 %-100 %] 99 % (01/07 0512) Last BM Date: 03/08/21  Intake/Output from previous day: 01/06 0701 - 01/07 0700 In: 800 [P.O.:200; IV Piggyback:600] Out: -  Intake/Output this shift: No intake/output data recorded.  PE: General: pleasant, WD, male in NAD Abd: soft, NT, ND, +BS Psych: A&Ox3 with an appropriate affect.    Lab Results:  Recent Labs    03/08/21 0320 03/09/21 0200  WBC 7.3 7.0  HGB 10.8* 10.6*  HCT 31.5* 30.7*  PLT 198 212   BMET Recent Labs    03/08/21 0320 03/08/21 1210 03/08/21 2201 03/09/21 0200  NA 137   137  --   --  134*  K <2.0*   <2.0*   < > 2.8* 3.0*  CL 102   101  --   --  103  CO2 27   27  --   --  24  GLUCOSE 99   99  --   --  106*  BUN 14   14  --   --  12  CREATININE 0.71   0.68  --   --  0.65  CALCIUM 8.2*   8.1*  --   --  7.8*   < > = values in this interval not displayed.   PT/INR No results for input(s): LABPROT, INR in the last 72 hours. CMP     Component Value Date/Time   NA 134 (L) 03/09/2021 0200   NA 139 02/10/2017 1323   K 3.0 (L) 03/09/2021 0200   K 3.4 (L) 02/10/2017 1323   CL 103 03/09/2021 0200   CO2 24 03/09/2021 0200   CO2 23 02/10/2017 1323   GLUCOSE 106 (H) 03/09/2021 0200   GLUCOSE 117 02/10/2017 1323   BUN 12 03/09/2021 0200   BUN 13.6 02/10/2017 1323   CREATININE 0.65 03/09/2021 0200   CREATININE 1.15 02/05/2021 1403   CREATININE 0.9 02/10/2017 1323   CALCIUM 7.8 (L) 03/09/2021 0200   CALCIUM 9.2 02/10/2017 1323   PROT 5.6 (L) 03/09/2021 0200   PROT 6.7 02/10/2017 1323    ALBUMIN 3.0 (L) 03/09/2021 0200   ALBUMIN 3.8 02/10/2017 1323   AST 20 03/09/2021 0200   AST 19 02/05/2021 1403   AST 20 02/10/2017 1323   ALT 15 03/09/2021 0200   ALT 16 02/05/2021 1403   ALT 15 02/10/2017 1323   ALKPHOS 43 03/09/2021 0200   ALKPHOS 50 02/10/2017 1323   BILITOT 0.9 03/09/2021 0200   BILITOT 0.5 02/05/2021 1403   BILITOT 0.52 02/10/2017 1323   GFRNONAA >60 03/09/2021 0200   GFRNONAA >60 02/05/2021 1403   GFRAA >60 11/29/2019 1157   Lipase     Component Value Date/Time   LIPASE 25.0 03/07/2021 0915       Studies/Results: CT Abdomen Pelvis W Contrast  Result Date: 03/07/2021 CLINICAL DATA:  Abdominal pain EXAM: CT ABDOMEN AND PELVIS WITH CONTRAST TECHNIQUE: Multidetector CT imaging of the abdomen and pelvis was performed using the standard protocol following bolus administration of intravenous contrast. CONTRAST:  136mL ISOVUE-300 IOPAMIDOL (ISOVUE-300)  INJECTION 61% COMPARISON:  02/01/2020 FINDINGS: Lower chest: Subtle increased markings are seen in the left lower lung fields. There is elevation of left hemidiaphragm. Hepatobiliary: Liver measures 19.7 cm in length. There are few low-density lesions in the liver each measuring less than 1.3 cm with no significant change, possibly suggesting cysts or hemangiomas. In image 20 of series 2, there is an ill-defined 2.1 cm area of low attenuation in the right lobe of liver. Gallbladder is unremarkable. Pancreas: No focal abnormality is seen. Spleen: Spleen measures 13.4 cm in maximum diameter. Adrenals/Urinary Tract: There is mild nodularity in the adrenals. There is no hydronephrosis. There are no renal or ureteral stones. There is 2 cm smooth marginated low-density lesion in the upper pole of left kidney suggesting renal cysts. Urinary bladder is unremarkable. Stomach/Bowel: Small hiatal hernia is seen. Stomach is not distended. Small bowel loops are not dilated. Appendix is not dilated. There is no significant wall thickening  in the colon. There is swirling of mesenteric vessels in the lower abdomen. There is abnormal dilation of portion of sigmoid colon. There is incomplete distention of rectosigmoid. Vascular/Lymphatic: There are scattered atherosclerotic plaques and calcifications. No significant lymphadenopathy seen. Reproductive: Unremarkable. Other: There is no pneumoperitoneum. There is small amount of free fluid in the pelvic cavity, possibly related to acute sigmoid volvulus. Small umbilical hernia containing fat is seen. Musculoskeletal: Degenerative changes are noted in the lumbar spine, particularly severe at L4-L5 level with spinal stenosis and encroachment of neural foramina. There are patchy areas of sclerosis in the vertebral bodies, possibly due to disc degeneration. IMPRESSION: There is sigmoid volvulus. Surgical consultation should be considered. There is no significant small bowel obstruction. There is no ascites or pneumoperitoneum. There is no hydronephrosis. Appendix is not dilated. Small hiatal hernia. There are few low-density foci in the liver, possibly cysts or hemangiomas. There is an ill-defined 2.1 cm low-attenuation in the right lobe of liver which may be beam hardening artifact or space-occupying lesion. Short-term follow-up multiphasic CT may be considered. Enlarged liver and spleen. Imaging finding of sigmoid volvulus was relayed to Dr. Ronnald Ramp by telephone call. Electronically Signed   By: Elmer Picker M.D.   On: 03/07/2021 12:06   DG ABD ACUTE 2+V W 1V CHEST  Addendum Date: 03/07/2021   ADDENDUM REPORT: 03/07/2021 09:46 ADDENDUM: These results were called by telephone at the time of interpretation on 03/07/2021 at 9:46 am to provider Scarlette Calico , who verbally acknowledged these results. Electronically Signed   By: Markus Daft M.D.   On: 03/07/2021 09:46   Result Date: 03/07/2021 CLINICAL DATA:  Generalized abdominal tenderness without rebound tenderness. Severe constipation. EXAM: DG ABDOMEN  ACUTE WITH 1 VIEW CHEST COMPARISON:  PET-CT 01/31/2021 and chest radiograph 12/27/2019 and CT abdomen 02/01/2020 FINDINGS: Stable position of the right jugular Port-A-Cath with the tip near the superior cavoatrial junction. Heart size is normal. Lungs are clear. Large dilated bowel loop in the mid upper abdomen. The configuration raises concern for a potential sigmoid volvulus. There is stool throughout the transverse and right colon. No evidence for free air. Patchy areas of sclerosis throughout the pelvic bones compatible with osseous metastatic disease. IMPRESSION: 1. Large dilated loop of bowel in the upper midline of the abdomen. Review of prior CT imaging demonstrates sigmoid colon in this region. Configuration of the dilated sigmoid colon raises concern for a possible volvulus. Recommend further characterization with a CT of the abdomen and pelvis. 2. Moderate to large amount of stool in the abdomen.  3. No acute chest abnormality. 4. Osseous metastatic disease. Electronically Signed: By: Markus Daft M.D. On: 03/07/2021 09:36   DG Abd Portable 1V  Result Date: 03/07/2021 CLINICAL DATA:  Endoscopic decompression of sigmoid volvulus EXAM: PORTABLE ABDOMEN - 1 VIEW COMPARISON:  03/07/2021 CT and x-ray FINDINGS: Supine frontal view of the abdomen and pelvis was performed, excluding the hemidiaphragms by collimation. Rectal tube overlies the distal rectosigmoid colon. There has been marked decompression of the gaseous distension of the sigmoid colon since prior study. No evidence of bowel obstruction or ileus. Moderate residual stool within the colon unchanged. No masses or abnormal calcifications. Sclerotic metastases throughout the spine, bony pelvis, and hips from known prostate cancer. IMPRESSION: 1. Interval decompression of the previous sigmoid volvulus, with indwelling rectal tube overlying the distal rectosigmoid colon. 2. Moderate fecal retention. 3. Stable bony metastases from prostate cancer. These  results were discussed by telephone at the time of interpretation on 03/07/2021 at 6:02 pm to provider [facility] , who verbally acknowledged these results. Electronically Signed   By: Randa Ngo M.D.   On: 03/07/2021 18:14    Anti-infectives: Anti-infectives (From admission, onward)    None        Assessment/Plan  Sigmoid volvulus  - CT 1/5 with sigmoid volvulus without SBO, ascites, or free air - afebrile, WBC normal - anemia - Hgb/Hct 10.6/31.5 - underwent successful flex sig for decompression with GI 1/5 - due to stool burden unable complete adequate screening. Given anemia GI reccs EGD and colonoscopy at some point - given risk for recurrence recommend sigmoidectomy during admission; however, patient refuses due to work obligations and wants to follow up as an outpatient for elective resection if he doesn't have a recurrence in the interim. -can have a diet from our standpoint as he is moving his bowels with no nausea or abdominal distention.  No evidence of recurrence at this time. -our office is working on scheduling for the patiet -stable for DC home when medically stable.    FEN: CLD ID: none VTE: chemical prophylaxis okay from general surgery perspective   Straightforward Medical Decision Making   LOS: 2 days   Henreitta Cea, [facility] Surgery 03/09/2021, 8:26 AM Please see Amion for pager number during day hours 7:00am-4:30pm

## 2021-03-10 LAB — VITAMIN B1: Vitamin B1 (Thiamine): 50 nmol/L — ABNORMAL HIGH (ref 8–30)

## 2021-03-10 LAB — RETICULOCYTES
ABS Retic: 53430 cells/uL (ref 25000–90000)
Retic Ct Pct: 1.3 %

## 2021-03-10 LAB — ZINC: Zinc: 71 ug/dL (ref 60–130)

## 2021-03-10 NOTE — Anesthesia Postprocedure Evaluation (Signed)
Anesthesia Post Note  Patient: Philip [name]  Procedure(s) Performed: FLEXIBLE SIGMOIDOSCOPY BOWEL DECOMPRESSION     Patient location during evaluation: PACU Anesthesia Type: MAC Level of consciousness: awake and alert Pain management: pain level controlled Vital Signs Assessment: post-procedure vital signs reviewed and stable Respiratory status: spontaneous breathing, nonlabored ventilation, respiratory function stable and patient connected to nasal cannula oxygen Cardiovascular status: stable and blood pressure returned to baseline Postop Assessment: no apparent nausea or vomiting Anesthetic complications: no   No notable events documented.  Last Vitals:  Vitals:   03/09/21 0512 03/09/21 1316  BP: (!) 143/74 (!) 153/90  Pulse: 78 75  Resp: 16 18  Temp: 36.6 C 36.6 C  SpO2: 99% 98%    Last Pain:  Vitals:   03/09/21 0858  TempSrc:   PainSc: 0-No pain                 Rockie Vawter L Anyae Griffith

## 2021-03-11 ENCOUNTER — Telehealth: Payer: Self-pay

## 2021-03-11 LAB — METHYLMALONIC ACID, SERUM: Methylmalonic Acid, Quant: 124 nmol/L (ref 87–318)

## 2021-03-11 NOTE — Telephone Encounter (Signed)
Transition Care Management Unsuccessful Follow-up Telephone Call  Date of discharge and from where:  [place] 03-09-21 Dx: sigmoid volvulus/ iron def anemia  Attempts:  1st Attempt  Reason for unsuccessful TCM follow-up call:  Left voice message

## 2021-03-11 NOTE — Telephone Encounter (Signed)
-----   Message from Yetta Flock, MD sent at 03/08/2021  4:05 PM EST ----- Regarding: outpatient EGD / colon [facility], This patient needs an EGD and colonoscopy with me in the [facility] in the next 2-3 weeks or so. He is in the hospital now and likely going home this weekend. Maybe you can call him on Monday to coordinate something? Can you remind me to talk to you about him next week, want to make sure he doesn't get lost to follow up. Thanks

## 2021-03-11 NOTE — Telephone Encounter (Signed)
Lm on vm for patient to return call to discuss appt.  Patient has been scheduled for a telephone visit on Friday, 03/22/21 at 8 am. Pt is scheduled for an endo/colon in the [facility] on Tuesday, 04/09/21 at 3:30 pm. Pt will need to arrive at 2:30 pm.

## 2021-03-12 NOTE — Telephone Encounter (Signed)
Transition Care Management Follow-up Telephone Call Date of discharge and from where: [place] 03-09-21 Dx: sigmoid volvulus/ iron defieciency anemia How have you been since you were released from the hospital? Feeling better  Any questions or concerns? No  Items Reviewed: Did the pt receive and understand the discharge instructions provided? Yes  Medications obtained and verified? Yes  Other? No  Any new allergies since your discharge? No  Dietary orders reviewed? Yes Do you have support at home? Yes   Home Care and Equipment/Supplies: Were home health services ordered? no If so, what is the name of the agency? na  Has the agency set up a time to come to the patient's home? not applicable Were any new equipment or medical supplies ordered?  No What is the name of the medical supply agency? na Were you able to get the supplies/equipment? not applicable Do you have any questions related to the use of the equipment or supplies? No  Functional Questionnaire: (I = Independent and D = Dependent) ADLs: I  Bathing/Dressing- I  Meal Prep- I  Eating- I  Maintaining continence- I  Transferring/Ambulation- I  Managing Meds- I  Follow up appointments reviewed:  PCP Hospital f/u appt confirmed? Yes  Scheduled to see Dr Ronnald Ramp  on 03-26-21 @ [facility] f/u appt confirmed? Yes  Scheduled to see [facility] on 03-22-21 @ 8am. Are transportation arrangements needed? No  If their condition worsens, is the pt aware to call PCP or go to the Emergency Dept.? Yes Was the patient provided with contact information for the PCP's office or ED? Yes Was to pt encouraged to call back with questions or concerns? Yes

## 2021-03-12 NOTE — Telephone Encounter (Signed)
I called and spoke with patient. He wanted to reschedule his appts. He is now scheduled for a telephone pre-visit on Wednesday, 04/03/21 at 10:30 am. He knows to expect a call from a nurse to review his instructions, medical hx, and get prep sent to his pharmacy. Pt is scheduled for EGD/colonoscopy in the [place] on Wednesday, 04/17/21 at 3 pm. Pt is aware that he will need to arrive on the 4th floor by 2 pm with a care partner. I have provided the pt with our office address. He  verbalized understanding and had no concerns at the end of the call.

## 2021-03-15 ENCOUNTER — Other Ambulatory Visit: Payer: Self-pay | Admitting: Internal Medicine

## 2021-03-15 DIAGNOSIS — I10 Essential (primary) hypertension: Secondary | ICD-10-CM

## 2021-03-26 ENCOUNTER — Encounter: Payer: Self-pay | Admitting: Internal Medicine

## 2021-03-26 ENCOUNTER — Ambulatory Visit (INDEPENDENT_AMBULATORY_CARE_PROVIDER_SITE_OTHER): Payer: Medicare Other | Admitting: Internal Medicine

## 2021-03-26 ENCOUNTER — Other Ambulatory Visit: Payer: Self-pay

## 2021-03-26 VITALS — BP 148/86 | HR 74 | Temp 98.1°F | Resp 16 | Ht 76.0 in | Wt 195.0 lb

## 2021-03-26 DIAGNOSIS — I10 Essential (primary) hypertension: Secondary | ICD-10-CM | POA: Diagnosis not present

## 2021-03-26 DIAGNOSIS — C61 Malignant neoplasm of prostate: Secondary | ICD-10-CM | POA: Diagnosis not present

## 2021-03-26 DIAGNOSIS — C419 Malignant neoplasm of bone and articular cartilage, unspecified: Secondary | ICD-10-CM

## 2021-03-26 DIAGNOSIS — D509 Iron deficiency anemia, unspecified: Secondary | ICD-10-CM | POA: Diagnosis not present

## 2021-03-26 NOTE — Progress Notes (Signed)
Subjective:  Patient ID: Philip [name], male    DOB: 10/01/1945  Age: 76 y.o. MRN: [id number]  CC: No chief complaint on file.  This visit occurred during the SARS-CoV-2 public health emergency.  Safety protocols were in place, including screening questions prior to the visit, additional usage of staff PPE, and extensive cleaning of exam room while observing appropriate contact time as indicated for disinfecting solutions.    HPI Philip [name] presents for f/up -  He was recently admitted for volvulus which was reduced endoscopically.  He tells me his bowel movements are normal.  He denies abdominal pain, nausea, vomiting, loss of appetite, bright red blood per rectum, or melena.  He was found to have iron deficiency anemia.  Discharge date: 03/09/2021   Admitted From: Home Disposition: Home   Recommendations for Outpatient Follow-up:  Follow up with PCP in 1-2 weeks Follow-up with general surgery and [facility] in 1-2 weeks Discharged with MiraLAX daily and ferrous sulfate 325 mg p.o. daily Please obtain BMP in 1 week to reassess potassium level Will need MR abdomen with and without contrast outpatient for further work-up of liver lesions   Outpatient Medications Prior to Visit  Medication Sig Dispense Refill   abiraterone acetate (ZYTIGA) 250 MG tablet TAKE 4 TABLETS BY MOUTH ONCE DAILY AS DIRECTED.  TAKE 1 HOUR BEFORE OR 2 HOURS AFTER A MEAL 120 tablet 10   atorvastatin (LIPITOR) 10 MG tablet TAKE 1 TABLET BY MOUTH EVERY DAY 90 tablet 1   BIOTIN 5000 PO Take 1 tablet by mouth daily.     CALCIUM-VITAMIN D PO Take 1 tablet by mouth daily.     Denosumab (XGEVA Meta) Inject into the skin.     fexofenadine (ALLEGRA) 180 MG tablet Take 180 mg by mouth daily.     hydrOXYzine (ATARAX/VISTARIL) 10 MG tablet Take 10 mg by mouth in the morning and at bedtime.     KLOR-CON M20 20 MEQ tablet TAKE 1 TABLET BY MOUTH TWICE A DAY 180 tablet 0   lidocaine-prilocaine (EMLA) cream Apply 1 application  topically as needed. (Patient taking differently: Apply 1 application topically as needed (access port).) 30 g 0   LORazepam (ATIVAN) 1 MG tablet TAKE 1 TABLET BY MOUTH EVERY 8 HOURS AS NEEDED FOR ANXIETY (Patient taking differently: Take 1 mg by mouth 2 (two) times daily.) 90 tablet 0   Multiple Vitamin (MULTIVITAMIN) tablet Take 1 tablet by mouth daily.     polyethylene glycol (MIRALAX / GLYCOLAX) 17 g packet Take 17 g by mouth daily. 30 packet 2   traMADol (ULTRAM) 50 MG tablet Take 1 tablet (50 mg total) by mouth every 12 (twelve) hours as needed for moderate pain. 180 tablet 1   ferrous sulfate 325 (65 FE) MG tablet Take 1 tablet (325 mg total) by mouth daily with breakfast. 30 tablet 2   indapamide (LOZOL) 1.25 MG tablet TAKE 1 TABLET BY MOUTH EVERY DAY 90 tablet 1   olmesartan (BENICAR) 20 MG tablet TAKE 1 TABLET BY MOUTH EVERY DAY 90 tablet 0   No facility-administered medications prior to visit.    ROS Review of Systems  Constitutional:  Negative for diaphoresis, fatigue and unexpected weight change.  HENT: Negative.  Negative for trouble swallowing.   Eyes: Negative.   Respiratory:  Negative for cough, chest tightness, shortness of breath and wheezing.   Cardiovascular:  Negative for chest pain, palpitations and leg swelling.  Gastrointestinal:  Negative for anal bleeding, blood in stool, constipation, diarrhea,  nausea and vomiting.  Endocrine: Negative.   Genitourinary: Negative.  Negative for difficulty urinating.  Musculoskeletal:  Negative for arthralgias and myalgias.  Skin: Negative.   Neurological:  Negative for dizziness, weakness, light-headedness and numbness.  Hematological:  Negative for adenopathy. Does not bruise/bleed easily.  Psychiatric/Behavioral: Negative.     Objective:  BP (!) 148/86 (BP Location: Right Arm, Patient Position: Sitting, Cuff Size: Large)    Pulse 74    Temp 98.1 F (36.7 C) (Oral)    Resp 16    Ht 6\' 4"  (1.93 m)    Wt 195 lb (88.5 kg)     SpO2 95%    BMI 23.74 kg/m   BP Readings from Last 3 Encounters:  03/26/21 (!) 148/86  03/09/21 (!) 153/90  03/07/21 (!) 146/88    Wt Readings from Last 3 Encounters:  03/26/21 195 lb (88.5 kg)  03/07/21 197 lb 15.6 oz (89.8 kg)  03/07/21 199 lb (90.3 kg)    Physical Exam Vitals reviewed.  HENT:     Nose: Nose normal.     Mouth/Throat:     Mouth: Mucous membranes are moist.  Eyes:     General: No scleral icterus.    Conjunctiva/sclera: Conjunctivae normal.  Cardiovascular:     Rate and Rhythm: Normal rate and regular rhythm.     Heart sounds: No murmur heard. Pulmonary:     Breath sounds: No stridor. No wheezing, rhonchi or rales.  Abdominal:     General: Abdomen is flat. Bowel sounds are normal. There is no distension.     Palpations: There is no hepatomegaly, splenomegaly or mass.     Tenderness: There is no abdominal tenderness. There is no guarding.  Musculoskeletal:        General: Normal range of motion.     Cervical back: Neck supple.     Right lower leg: No edema.  Lymphadenopathy:     Cervical: No cervical adenopathy.  Skin:    General: Skin is warm.     Coloration: Skin is pale.  Neurological:     General: No focal deficit present.     Mental Status: He is alert.  Psychiatric:        Mood and Affect: Mood normal.    Lab Results  Component Value Date   WBC 7.9 03/26/2021   HGB 12.3 (L) 03/26/2021   HCT 36.7 (L) 03/26/2021   PLT 275.0 03/26/2021   GLUCOSE 98 03/26/2021   CHOL 181 05/09/2020   TRIG 220.0 (H) 05/09/2020   HDL 72.70 05/09/2020   LDLDIRECT 75.0 05/09/2020   ALT 15 03/09/2021   AST 20 03/09/2021   NA 130 (L) 03/26/2021   K 3.8 03/26/2021   CL 93 (L) 03/26/2021   CREATININE 0.77 03/26/2021   BUN 13 03/26/2021   CO2 28 03/26/2021   TSH 1.87 03/07/2021   PSA 0.02 (L) 03/26/2021   INR 1.06 02/09/2015    CT Abdomen Pelvis W Contrast  Result Date: 03/07/2021 CLINICAL DATA:  Abdominal pain EXAM: CT ABDOMEN AND PELVIS WITH CONTRAST  TECHNIQUE: Multidetector CT imaging of the abdomen and pelvis was performed using the standard protocol following bolus administration of intravenous contrast. CONTRAST:  154mL ISOVUE-300 IOPAMIDOL (ISOVUE-300) INJECTION 61% COMPARISON:  02/01/2020 FINDINGS: Lower chest: Subtle increased markings are seen in the left lower lung fields. There is elevation of left hemidiaphragm. Hepatobiliary: Liver measures 19.7 cm in length. There are few low-density lesions in the liver each measuring less than 1.3 cm with no significant  change, possibly suggesting cysts or hemangiomas. In image 20 of series 2, there is an ill-defined 2.1 cm area of low attenuation in the right lobe of liver. Gallbladder is unremarkable. Pancreas: No focal abnormality is seen. Spleen: Spleen measures 13.4 cm in maximum diameter. Adrenals/Urinary Tract: There is mild nodularity in the adrenals. There is no hydronephrosis. There are no renal or ureteral stones. There is 2 cm smooth marginated low-density lesion in the upper pole of left kidney suggesting renal cysts. Urinary bladder is unremarkable. Stomach/Bowel: Small hiatal hernia is seen. Stomach is not distended. Small bowel loops are not dilated. Appendix is not dilated. There is no significant wall thickening in the colon. There is swirling of mesenteric vessels in the lower abdomen. There is abnormal dilation of portion of sigmoid colon. There is incomplete distention of rectosigmoid. Vascular/Lymphatic: There are scattered atherosclerotic plaques and calcifications. No significant lymphadenopathy seen. Reproductive: Unremarkable. Other: There is no pneumoperitoneum. There is small amount of free fluid in the pelvic cavity, possibly related to acute sigmoid volvulus. Small umbilical hernia containing fat is seen. Musculoskeletal: Degenerative changes are noted in the lumbar spine, particularly severe at L4-L5 level with spinal stenosis and encroachment of neural foramina. There are patchy  areas of sclerosis in the vertebral bodies, possibly due to disc degeneration. IMPRESSION: There is sigmoid volvulus. Surgical consultation should be considered. There is no significant small bowel obstruction. There is no ascites or pneumoperitoneum. There is no hydronephrosis. Appendix is not dilated. Small hiatal hernia. There are few low-density foci in the liver, possibly cysts or hemangiomas. There is an ill-defined 2.1 cm low-attenuation in the right lobe of liver which may be beam hardening artifact or space-occupying lesion. Short-term follow-up multiphasic CT may be considered. Enlarged liver and spleen. Imaging finding of sigmoid volvulus was relayed to Dr. Ronnald Ramp by telephone call. Electronically Signed   By: Elmer Picker M.D.   On: 03/07/2021 12:06   DG ABD ACUTE 2+V W 1V CHEST  Addendum Date: 03/07/2021   ADDENDUM REPORT: 03/07/2021 09:46 ADDENDUM: These results were called by telephone at the time of interpretation on 03/07/2021 at 9:46 am to provider Scarlette Calico , who verbally acknowledged these results. Electronically Signed   By: Markus Daft M.D.   On: 03/07/2021 09:46   Result Date: 03/07/2021 CLINICAL DATA:  Generalized abdominal tenderness without rebound tenderness. Severe constipation. EXAM: DG ABDOMEN ACUTE WITH 1 VIEW CHEST COMPARISON:  PET-CT 01/31/2021 and chest radiograph 12/27/2019 and CT abdomen 02/01/2020 FINDINGS: Stable position of the right jugular Port-A-Cath with the tip near the superior cavoatrial junction. Heart size is normal. Lungs are clear. Large dilated bowel loop in the mid upper abdomen. The configuration raises concern for a potential sigmoid volvulus. There is stool throughout the transverse and right colon. No evidence for free air. Patchy areas of sclerosis throughout the pelvic bones compatible with osseous metastatic disease. IMPRESSION: 1. Large dilated loop of bowel in the upper midline of the abdomen. Review of prior CT imaging demonstrates sigmoid colon  in this region. Configuration of the dilated sigmoid colon raises concern for a possible volvulus. Recommend further characterization with a CT of the abdomen and pelvis. 2. Moderate to large amount of stool in the abdomen. 3. No acute chest abnormality. 4. Osseous metastatic disease. Electronically Signed: By: Markus Daft M.D. On: 03/07/2021 09:36   DG Abd Portable 1V  Result Date: 03/07/2021 CLINICAL DATA:  Endoscopic decompression of sigmoid volvulus EXAM: PORTABLE ABDOMEN - 1 VIEW COMPARISON:  03/07/2021 CT and x-ray  FINDINGS: Supine frontal view of the abdomen and pelvis was performed, excluding the hemidiaphragms by collimation. Rectal tube overlies the distal rectosigmoid colon. There has been marked decompression of the gaseous distension of the sigmoid colon since prior study. No evidence of bowel obstruction or ileus. Moderate residual stool within the colon unchanged. No masses or abnormal calcifications. Sclerotic metastases throughout the spine, bony pelvis, and hips from known prostate cancer. IMPRESSION: 1. Interval decompression of the previous sigmoid volvulus, with indwelling rectal tube overlying the distal rectosigmoid colon. 2. Moderate fecal retention. 3. Stable bony metastases from prostate cancer. These results were discussed by telephone at the time of interpretation on 03/07/2021 at 6:02 pm to provider [facility] , who verbally acknowledged these results. Electronically Signed   By: Randa Ngo M.D.   On: 03/07/2021 18:14    Assessment & Plan:   Diagnoses and all orders for this visit:  Essential hypertension- His blood pressure is adequately well controlled but his sodium and chloride are low.  Will discontinue the thiazide diuretic and will increase the dose of the ARB. -     Basic metabolic panel; Future -     Basic metabolic panel -     olmesartan (BENICAR) 40 MG tablet; Take 1 tablet (40 mg total) by mouth daily.  Malignant neoplasm of bone, unspecified location  [facility])  Prostate cancer ([place])- His PSA is reassuringly low. -     PSA; Future -     PSA  Iron deficiency anemia, unspecified iron deficiency anemia type- His H&H have improved some but his iron level remains low.  I recommended that he upgrade to a more effective iron supplement. -     Basic metabolic panel; Future -     IBC + Ferritin; Future -     CBC with Differential/Platelet; Future -     CBC with Differential/Platelet -     IBC + Ferritin -     Basic metabolic panel -     Ferric Maltol (ACCRUFER) 30 MG CAPS; Take 1 capsule by mouth in the morning and at bedtime.   I have discontinued Leary Mcclain's indapamide, ferrous sulfate, and olmesartan. I am also having him start on olmesartan and ACCRUFeR. Additionally, I am having him maintain his fexofenadine, BIOTIN 5000 PO, Denosumab (XGEVA [place]), multivitamin, hydrOXYzine, lidocaine-prilocaine, traMADol, Klor-Con M20, atorvastatin, abiraterone acetate, LORazepam, CALCIUM-VITAMIN D PO, and polyethylene glycol.  Meds ordered this encounter  Medications   olmesartan (BENICAR) 40 MG tablet    Sig: Take 1 tablet (40 mg total) by mouth daily.    Dispense:  90 tablet    Refill:  0   Ferric Maltol (ACCRUFER) 30 MG CAPS    Sig: Take 1 capsule by mouth in the morning and at bedtime.    Dispense:  180 capsule    Refill:  1     Follow-up: Return in about 3 months (around 06/24/2021).  Scarlette Calico, MD

## 2021-03-26 NOTE — Patient Instructions (Signed)
Iron Deficiency Anemia, Adult Iron deficiency anemia is a condition in which the concentration of red blood cells or hemoglobin in the blood is below normal because of too little iron. Hemoglobin is a substance in red blood cells that carries oxygen to the body's tissues. When the concentration of red blood cells or hemoglobin is too low, not enough oxygen reaches these tissues. Iron deficiency anemia is usually long-lasting, and it develops over time. It may or may not cause symptoms. It is a common type of anemia. What are the causes? This condition may be caused by: Not enough iron in the diet. Abnormal absorption in the gut. Increased need for iron because of pregnancy or heavy menstrual periods, for females. Cancers of the gastrointestinal system, such as colon cancer. Blood loss caused by bleeding in the intestine. This may be from a gastrointestinal condition like Crohn's disease. Frequent blood draws, such as from blood donation. What increases the risk? The following factors may make you more likely to develop this condition: Being pregnant. Being a teenage girl going through a growth spurt. What are the signs or symptoms? Symptoms of this condition may include: Pale skin, lips, and nail beds. Weakness, dizziness, and getting tired easily. Headache. Shortness of breath when moving or exercising. Cold hands and feet. Fast or irregular heartbeat. Irritability or rapid breathing. These are more common in severe anemia. Mild anemia may not cause any symptoms. How is this diagnosed? This condition is diagnosed based on: Your medical history. A physical exam. Blood tests. You may have additional tests to find the underlying cause of your anemia, such as: Testing for blood in the stool (fecal occult blood test). A procedure to see inside your colon and rectum (colonoscopy). A procedure to see inside your esophagus and stomach (endoscopy). A test in which cells are removed from  bone marrow (bone marrow aspiration) or fluid is removed from the bone marrow to be examined. This is rarely needed. How is this treated? This condition is treated by correcting the cause of your iron deficiency. Treatment may involve: Adding iron-rich foods to your diet. Taking iron supplements. If you are pregnant or breastfeeding, you may need to take extra iron because your normal diet usually does not provide the amount of iron that you need. Increasing vitamin C intake. Vitamin C helps your body absorb iron. Your health care provider may recommend that you take iron supplements along with a glass of orange juice or a vitamin C supplement. Medicines to make heavy menstrual flow lighter. Surgery. You may need repeat blood tests to determine whether treatment is working. If the treatment does not seem to be working, you may need more tests. Follow these instructions at home: Medicines Take over-the-counter and prescription medicines only as told by your health care provider. This includes iron supplements and vitamins. For the best iron absorption, you should take iron supplements when your stomach is empty. If you cannot tolerate them on an empty stomach, you may need to take them with food. Do not drink milk or take antacids at the same time as your iron supplements. Milk and antacids may interfere with iron absorption. Iron supplements may turn stool (feces) a darker color and it may appear black. If you cannot tolerate taking iron supplements by mouth, talk with your health care provider about taking them through an IV or through an injection into a muscle. Eating and drinking  Talk with your health care provider before changing your diet. He or she may recommend   that you eat foods that contain a lot of iron, such as: Liver. Low-fat (lean) beef. Breads and cereals that have iron added to them (are fortified). Eggs. Dried fruit. Dark green, leafy vegetables. To help your body use the  iron from iron-rich foods, eat those foods at the same time as fresh fruits and vegetables that are high in vitamin C. Foods that are high in vitamin C include: Oranges. Peppers. Tomatoes. Mangoes. Drink enough fluid to keep your urine pale yellow. Managing constipation If you are taking an iron supplement, it may cause constipation. To prevent or treat constipation, you may need to: Take over-the-counter or prescription medicines. Eat foods that are high in fiber, such as beans, whole grains, and fresh fruits and vegetables. Limit foods that are high in fat and processed sugars, such as fried or sweet foods. General instructions Return to your normal activities as told by your health care provider. Ask your health care provider what activities are safe for you. Practice good hygiene. Anemia can make you more prone to illness and infection. Keep all follow-up visits as told by your health care provider. This is important. Contact a health care provider if you: Feel nauseous or you vomit. Feel weak. Have unexplained sweating. Develop symptoms of constipation, such as: Having fewer than three bowel movements a week. Straining to have a bowel movement. Having stools that are hard, dry, or larger than normal. Feeling full or bloated. Pain in the lower abdomen. Not feeling relief after having a bowel movement. Get help right away if you: Faint. If this happens, do not drive yourself to the hospital. Have chest pain. Have shortness of breath that: Is severe. Gets worse with physical activity. Have an irregular or rapid heartbeat. Become light-headed when getting up from a sitting or lying down position. These symptoms may represent a serious problem that is an emergency. Do not wait to see if the symptoms will go away. Get medical help right away. Call your local emergency services (911 in the [place] Do not drive yourself to the hospital. Summary Iron deficiency anemia is a condition in  which the concentration of red blood cells or hemoglobin in the blood is below normal because of too little iron. This condition is treated by correcting the cause of your iron deficiency. Take over-the-counter and prescription medicines only as told by your health care provider. This includes iron supplements and vitamins. To help your body use the iron from iron-rich foods, eat those foods at the same time as fresh fruits and vegetables that are high in vitamin C. Get help right away if you have shortness of breath that gets worse with physical activity. This information is not intended to replace advice given to you by your health care provider. Make sure you discuss any questions you have with your health care provider. Document Revised: 10/26/2018 Document Reviewed: 10/26/2018 Elsevier Patient Education  2022 [employer]  

## 2021-03-27 LAB — IBC + FERRITIN
Ferritin: 171 ng/mL (ref 22.0–322.0)
Iron: 32 ug/dL — ABNORMAL LOW (ref 42–165)
Saturation Ratios: 8.8 % — ABNORMAL LOW (ref 20.0–50.0)
TIBC: 364 ug/dL (ref 250.0–450.0)
Transferrin: 260 mg/dL (ref 212.0–360.0)

## 2021-03-27 LAB — BASIC METABOLIC PANEL
BUN: 13 mg/dL (ref 6–23)
CO2: 28 mEq/L (ref 19–32)
Calcium: 9.2 mg/dL (ref 8.4–10.5)
Chloride: 93 mEq/L — ABNORMAL LOW (ref 96–112)
Creatinine, Ser: 0.77 mg/dL (ref 0.40–1.50)
GFR: 86.99 mL/min (ref >60.00)
Glucose, Bld: 98 mg/dL (ref 70–99)
Potassium: 3.8 mEq/L (ref 3.5–5.1)
Sodium: 130 mEq/L — ABNORMAL LOW (ref 135–145)

## 2021-03-27 LAB — CBC WITH DIFFERENTIAL/PLATELET
Basophils Absolute: 0 10*3/uL (ref 0.0–0.1)
Basophils Relative: 0.5 % (ref 0.0–3.0)
Eosinophils Absolute: 0.1 10*3/uL (ref 0.0–0.7)
Eosinophils Relative: 1.3 % (ref 0.0–5.0)
HCT: 36.7 % — ABNORMAL LOW (ref 39.0–52.0)
Hemoglobin: 12.3 g/dL — ABNORMAL LOW (ref 13.0–17.0)
Lymphocytes Relative: 25.1 % (ref 12.0–46.0)
Lymphs Abs: 2 10*3/uL (ref 0.7–4.0)
MCHC: 33.5 g/dL (ref 30.0–36.0)
MCV: 90.2 fl (ref 78.0–100.0)
Monocytes Absolute: 1 10*3/uL (ref 0.1–1.0)
Monocytes Relative: 12.1 % — ABNORMAL HIGH (ref 3.0–12.0)
Neutro Abs: 4.8 10*3/uL (ref 1.4–7.7)
Neutrophils Relative %: 61 % (ref 43.0–77.0)
Platelets: 275 10*3/uL (ref 150.0–400.0)
RBC: 4.07 Mil/uL — ABNORMAL LOW (ref 4.22–5.81)
RDW: 12.7 % (ref 11.5–15.5)
WBC: 7.9 10*3/uL (ref 4.0–10.5)

## 2021-03-27 LAB — PSA: PSA: 0.02 ng/mL — ABNORMAL LOW (ref 0.10–4.00)

## 2021-03-27 MED ORDER — OLMESARTAN MEDOXOMIL 40 MG PO TABS: 40.0000 mg | ORAL_TABLET | Freq: Every day | ORAL | 0 refills | Status: DC

## 2021-03-27 MED ORDER — ACCRUFER 30 MG PO CAPS: 1.0000 | ORAL_CAPSULE | Freq: Two times a day (BID) | ORAL | 1 refills | Status: DC

## 2021-03-28 ENCOUNTER — Encounter: Payer: Self-pay | Admitting: Internal Medicine

## 2021-03-29 ENCOUNTER — Encounter: Payer: Self-pay | Admitting: Internal Medicine

## 2021-04-02 ENCOUNTER — Other Ambulatory Visit (HOSPITAL_COMMUNITY): Payer: Self-pay

## 2021-04-03 ENCOUNTER — Other Ambulatory Visit: Payer: Self-pay

## 2021-04-03 ENCOUNTER — Ambulatory Visit (AMBULATORY_SURGERY_CENTER): Payer: Medicare Other | Admitting: *Deleted

## 2021-04-03 VITALS — Ht 76.0 in | Wt 195.0 lb

## 2021-04-03 DIAGNOSIS — D509 Iron deficiency anemia, unspecified: Secondary | ICD-10-CM

## 2021-04-03 MED ORDER — SUTAB 1479-225-188 MG PO TABS: 1.0000 | ORAL_TABLET | ORAL | 0 refills | Status: DC

## 2021-04-03 NOTE — Progress Notes (Signed)

## 2021-04-09 ENCOUNTER — Inpatient Hospital Stay: Payer: Medicare Other | Attending: Oncology

## 2021-04-09 ENCOUNTER — Encounter: Payer: Medicare Other | Admitting: Gastroenterology

## 2021-04-09 ENCOUNTER — Other Ambulatory Visit: Payer: Self-pay

## 2021-04-09 DIAGNOSIS — R160 Hepatomegaly, not elsewhere classified: Secondary | ICD-10-CM | POA: Insufficient documentation

## 2021-04-09 DIAGNOSIS — K449 Diaphragmatic hernia without obstruction or gangrene: Secondary | ICD-10-CM | POA: Diagnosis not present

## 2021-04-09 DIAGNOSIS — D509 Iron deficiency anemia, unspecified: Secondary | ICD-10-CM | POA: Insufficient documentation

## 2021-04-09 DIAGNOSIS — C7951 Secondary malignant neoplasm of bone: Secondary | ICD-10-CM | POA: Insufficient documentation

## 2021-04-09 DIAGNOSIS — Z95828 Presence of other vascular implants and grafts: Secondary | ICD-10-CM

## 2021-04-09 DIAGNOSIS — C61 Malignant neoplasm of prostate: Secondary | ICD-10-CM | POA: Diagnosis not present

## 2021-04-09 DIAGNOSIS — I10 Essential (primary) hypertension: Secondary | ICD-10-CM | POA: Insufficient documentation

## 2021-04-09 DIAGNOSIS — R591 Generalized enlarged lymph nodes: Secondary | ICD-10-CM | POA: Insufficient documentation

## 2021-04-09 LAB — CMP (CANCER CENTER ONLY)
ALT: 12 U/L (ref 0–44)
AST: 18 U/L (ref 15–41)
Albumin: 4 g/dL (ref 3.5–5.0)
Alkaline Phosphatase: 54 U/L (ref 38–126)
Anion gap: 7 (ref 5–15)
BUN: 19 mg/dL (ref 8–23)
CO2: 26 mmol/L (ref 22–32)
Calcium: 9.2 mg/dL (ref 8.9–10.3)
Chloride: 102 mmol/L (ref 98–111)
Creatinine: 0.83 mg/dL (ref 0.61–1.24)
GFR, Estimated: >60 mL/min (ref >60)
Glucose, Bld: 101 mg/dL — ABNORMAL HIGH (ref 70–99)
Potassium: 3.9 mmol/L (ref 3.5–5.1)
Sodium: 135 mmol/L (ref 135–145)
Total Bilirubin: 0.5 mg/dL (ref 0.3–1.2)
Total Protein: 6.4 g/dL — ABNORMAL LOW (ref 6.5–8.1)

## 2021-04-09 LAB — CBC WITH DIFFERENTIAL (CANCER CENTER ONLY)
Abs Immature Granulocytes: 0.01 10*3/uL (ref 0.00–0.07)
Basophils Absolute: 0 10*3/uL (ref 0.0–0.1)
Basophils Relative: 1 %
Eosinophils Absolute: 0.1 10*3/uL (ref 0.0–0.5)
Eosinophils Relative: 2 %
HCT: 35.4 % — ABNORMAL LOW (ref 39.0–52.0)
Hemoglobin: 12.1 g/dL — ABNORMAL LOW (ref 13.0–17.0)
Immature Granulocytes: 0 %
Lymphocytes Relative: 30 %
Lymphs Abs: 1.8 10*3/uL (ref 0.7–4.0)
MCH: 31.1 pg (ref 26.0–34.0)
MCHC: 34.2 g/dL (ref 30.0–36.0)
MCV: 91 fL (ref 80.0–100.0)
Monocytes Absolute: 0.6 10*3/uL (ref 0.1–1.0)
Monocytes Relative: 10 %
Neutro Abs: 3.5 10*3/uL (ref 1.7–7.7)
Neutrophils Relative %: 57 %
Platelet Count: 244 10*3/uL (ref 150–400)
RBC: 3.89 MIL/uL — ABNORMAL LOW (ref 4.22–5.81)
RDW: 13.3 % (ref 11.5–15.5)
WBC Count: 6 10*3/uL (ref 4.0–10.5)
nRBC: 0 % (ref 0.0–0.2)

## 2021-04-09 MED ORDER — HEPARIN SOD (PORK) LOCK FLUSH 100 UNIT/ML IV SOLN
500.0000 [IU] | Freq: Once | INTRAVENOUS | Status: AC
  Administered 2021-04-09: 500 [IU]

## 2021-04-09 MED ORDER — SODIUM CHLORIDE 0.9% FLUSH
10.0000 mL | Freq: Once | INTRAVENOUS | Status: AC
  Administered 2021-04-09: 10 mL

## 2021-04-10 LAB — PROSTATE-SPECIFIC AG, SERUM (LABCORP): Prostate Specific Ag, Serum: <0.1 ng/mL (ref 0.0–4.0)

## 2021-04-11 ENCOUNTER — Telehealth: Payer: Self-pay

## 2021-04-11 ENCOUNTER — Other Ambulatory Visit: Payer: Self-pay

## 2021-04-11 ENCOUNTER — Other Ambulatory Visit: Payer: Self-pay | Admitting: Internal Medicine

## 2021-04-11 ENCOUNTER — Other Ambulatory Visit (HOSPITAL_COMMUNITY): Payer: Self-pay

## 2021-04-11 ENCOUNTER — Inpatient Hospital Stay: Payer: Medicare Other

## 2021-04-11 ENCOUNTER — Inpatient Hospital Stay (HOSPITAL_BASED_OUTPATIENT_CLINIC_OR_DEPARTMENT_OTHER): Payer: Medicare Other | Admitting: Oncology

## 2021-04-11 VITALS — BP 160/90 | HR 88 | Temp 98.2°F | Resp 18 | Ht 76.0 in | Wt 194.7 lb

## 2021-04-11 VITALS — HR 77 | Temp 97.9°F | Resp 18

## 2021-04-11 DIAGNOSIS — C61 Malignant neoplasm of prostate: Secondary | ICD-10-CM

## 2021-04-11 DIAGNOSIS — Z95828 Presence of other vascular implants and grafts: Secondary | ICD-10-CM

## 2021-04-11 DIAGNOSIS — D509 Iron deficiency anemia, unspecified: Secondary | ICD-10-CM | POA: Diagnosis not present

## 2021-04-11 DIAGNOSIS — E876 Hypokalemia: Secondary | ICD-10-CM

## 2021-04-11 DIAGNOSIS — R591 Generalized enlarged lymph nodes: Secondary | ICD-10-CM | POA: Diagnosis not present

## 2021-04-11 DIAGNOSIS — C7951 Secondary malignant neoplasm of bone: Secondary | ICD-10-CM | POA: Diagnosis not present

## 2021-04-11 DIAGNOSIS — R160 Hepatomegaly, not elsewhere classified: Secondary | ICD-10-CM | POA: Diagnosis not present

## 2021-04-11 DIAGNOSIS — I10 Essential (primary) hypertension: Secondary | ICD-10-CM

## 2021-04-11 DIAGNOSIS — K449 Diaphragmatic hernia without obstruction or gangrene: Secondary | ICD-10-CM | POA: Diagnosis not present

## 2021-04-11 MED ORDER — DENOSUMAB 120 MG/1.7ML ~~LOC~~ SOLN
120.0000 mg | Freq: Once | SUBCUTANEOUS | Status: AC
  Administered 2021-04-11: 120 mg via SUBCUTANEOUS
  Filled 2021-04-11: qty 1.7

## 2021-04-11 MED ORDER — XTANDI 40 MG PO CAPS
160.0000 mg | ORAL_CAPSULE | Freq: Every day | ORAL | 0 refills | Status: DC
  Filled 2021-04-11: qty 120, 30d supply, fill #0

## 2021-04-11 NOTE — Addendum Note (Signed)
Addended by: [name] on: 04/11/2021 03:26 PM   Modules accepted: Orders

## 2021-04-11 NOTE — Progress Notes (Signed)
Hematology and Oncology Follow Up Visit  Philip [name] [id number] Oct 24, 1945 76 y.o. 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  He presented with advanced disease and PSA of 2900.   Prior Therapy: Status post orchiectomy done on November 2016.  Taxotere chemotherapy at 75 mg/m to start on 02/16/2015. He is completed 6 cycles of therapy in March 2017.  Current therapy: Zytiga 1000 mg daily started in July 2017.    Interim History:  [name] returns today for a follow-up visit.  Since the last visit, he was hospitalized on March 07, 2021 after presenting with sigmoid volvulus and symptoms of nausea and vomiting associated with it.  He underwent sigmoidoscopy and decompression and was discharged with improved condition.  Since his discharge, he reports feeling well without any major complaints.  He denied any nausea, vomiting with abdominal pain.  He denies any constipation or diarrhea.  He has resumed most activities of daily living.  Medications: Reviewed without changes. Current Outpatient Medications  Medication Sig Dispense Refill   abiraterone acetate (ZYTIGA) 250 MG tablet TAKE 4 TABLETS BY MOUTH ONCE DAILY AS DIRECTED.  TAKE 1 HOUR BEFORE OR 2 HOURS AFTER A MEAL 120 tablet 10   aspirin EC 81 MG tablet Take 81 mg by mouth daily. Swallow whole.     atorvastatin (LIPITOR) 10 MG tablet TAKE 1 TABLET BY MOUTH EVERY DAY 90 tablet 1   BIOTIN 5000 PO Take 1 tablet by mouth daily.     CALCIUM-VITAMIN D PO Take 1 tablet by mouth daily.     Denosumab (XGEVA [place]) Inject into the skin.     ferrous sulfate 325 (65 FE) MG EC tablet Take 325 mg by mouth daily with breakfast.     fexofenadine (ALLEGRA) 180 MG tablet Take 180 mg by mouth daily.     hydrOXYzine (ATARAX/VISTARIL) 10 MG tablet Take 10 mg by mouth in the morning and at bedtime.      KLOR-CON M20 20 MEQ tablet TAKE 1 TABLET BY MOUTH TWICE A DAY 180 tablet 0   lidocaine-prilocaine (EMLA) cream Apply 1 application topically as needed. (Patient taking differently: Apply 1 application topically as needed (access port).) 30 g 0   LORazepam (ATIVAN) 1 MG tablet TAKE 1 TABLET BY MOUTH EVERY 8 HOURS AS NEEDED FOR ANXIETY (Patient taking differently: Take 1 mg by mouth 2 (two) times daily.) 90 tablet 0   Multiple Vitamin (MULTIVITAMIN) tablet Take 1 tablet by mouth daily.     olmesartan (BENICAR) 40 MG tablet Take 1 tablet (40 mg total) by mouth daily. 90 tablet 0   polyethylene glycol (MIRALAX / GLYCOLAX) 17 g packet Take 17 g by mouth daily. 30 packet 2   Sodium Sulfate-Mag Sulfate-KCl (SUTAB) [phone] MG TABS Take 1 kit by mouth as directed. KZL:[id number] PCN:CN XBL:[id number] RA:[id number] MEDICARE COUPON 24 tablet 0   No current facility-administered medications for this visit.     Allergies:  Allergies  Allergen Reactions   Prednisone Anxiety      Physical Exam:     Blood pressure (!) 160/90, pulse 88, temperature 98.2 F (36.8 C), temperature source Temporal, resp. rate 18, height _0  (1.93 m), weight 194 lb 11.2 oz (88.3 kg), SpO2 100 %.     ECOG: 0      General appearance: Comfortable appearing without any discomfort Head: Normocephalic without any trauma Oropharynx: Mucous membranes  are moist and pink without any thrush or ulcers. Eyes: Pupils are equal and round reactive to light. Lymph nodes: No cervical, supraclavicular, inguinal or axillary lymphadenopathy.   Heart:regular rate and rhythm.  S1 and S2 without leg edema. Lung: Clear without any rhonchi or wheezes.  No dullness to percussion. Abdomin: Soft, nontender, nondistended with good bowel sounds.  No hepatosplenomegaly. Musculoskeletal: No joint deformity or effusion.  Full range of motion noted. Neurological: No deficits noted on motor, sensory and deep tendon reflex exam. Skin: No  petechial rash or dryness.  Appeared moist.                         Lab Results: Lab Results  Component Value Date   WBC 6.0 04/09/2021   HGB 12.1 (L) 04/09/2021   HCT 35.4 (L) 04/09/2021   MCV 91.0 04/09/2021   PLT 244 04/09/2021     Chemistry      Component Value Date/Time   NA 135 04/09/2021 1353   NA 139 02/10/2017 1323   K 3.9 04/09/2021 1353   K 3.4 (L) 02/10/2017 1323   CL 102 04/09/2021 1353   CO2 26 04/09/2021 1353   CO2 23 02/10/2017 1323   BUN 19 04/09/2021 1353   BUN 13.6 02/10/2017 1323   CREATININE 0.83 04/09/2021 1353   CREATININE 0.9 02/10/2017 1323      Component Value Date/Time   CALCIUM 9.2 04/09/2021 1353   CALCIUM 9.2 02/10/2017 1323   ALKPHOS 54 04/09/2021 1353   ALKPHOS 50 02/10/2017 1323   AST 18 04/09/2021 1353   AST 20 02/10/2017 1323   ALT 12 04/09/2021 1353   ALT 15 02/10/2017 1323   BILITOT 0.5 04/09/2021 1353   BILITOT 0.52 02/10/2017 1323       IMPRESSION: There is sigmoid volvulus. Surgical consultation should be considered.   There is no significant small bowel obstruction. There is no ascites or pneumoperitoneum. There is no hydronephrosis. Appendix is not dilated.   Small hiatal hernia. There are few low-density foci in the liver, possibly cysts or hemangiomas. There is an ill-defined 2.1 cm low-attenuation in the [name] lobe of liver which may be beam hardening artifact or space-occupying lesion. Short-term follow-up multiphasic CT may be considered. Enlarged liver and spleen.   Imaging finding of sigmoid volvulus was relayed to Dr. Ronnald [name] by telephone call.         Impression and Plan:  76 year old man with:  1.  Castration-resistant advanced prostate cancer with disease to the bone and lymphadenopathy diagnosed in 2016.   The natural course of his disease was reviewed at this time.  CT scan on March 07, 2021 was also reviewed and showed no evidence of metastatic disease.  His PSA  continues to be undetectable and I have recommended continuing Zytiga.  Complications including weight gain hot flashes and others were reiterated.  Alternative treatment options including Pluvicto and chemotherapy were reviewed.   2. IV access: Port-A-Cath continues to be in place and flushed periodically.  3. Hypertension: His blood pressure is mildly elevated and monitored by Dr. Ronnald [name].  4.  Androgen depravation: No additional androgen deprivation is noted at this time.  He is status post orchiectomy.  5.  Bone directed therapy: Risks and benefits of continuing Xgeva were reviewed at this time.  Complications including osteonecrosis of the jaw and hypocalcemia were reviewed.  He is agreeable to proceed.  6.  Anemia: He has some mild iron deficiency and currently on  oral iron replacement.  Iron studies in January 2023 showed mild improvement we will continue to monitor in future.  I have no objections to continuing oral iron.  7.  Sigmoid volvulus: Unclear etiology and it is unlikely related to his malignancy.  He is scheduled to have a colonoscopy in the near future and surgical evaluation for possible sigmoidectomy.  8. Follow-up: In 2 months for follow-up visit.  30 minutes were dedicated to this visit.  The time was spent on reviewing laboratory data, disease status update and outlining future plan of care.  Zola Button, MD 2/9/202312:50 PM

## 2021-04-15 ENCOUNTER — Telehealth: Payer: Self-pay

## 2021-04-15 NOTE — Telephone Encounter (Signed)
Oral Oncology Patient Advocate Encounter  Met patient in lobby room to complete application for [employer] in an effort to reduce patient's out of pocket expense for Xtandi to $0.    Application completed and faxed to [phone].   Xtandi patient assistance phone number for follow up is [phone].   This encounter will be updated until final determination.    [place] [facility] [place] Phone [phone] Fax [phone] 04/15/2021 3:18 PM

## 2021-04-16 ENCOUNTER — Encounter: Payer: Self-pay | Admitting: Oncology

## 2021-04-16 NOTE — Telephone Encounter (Signed)
Oral Oncology Pharmacist Encounter  Received new prescription for enzalutamide Gillermina Phy) for the treatment of castration-resistant prostate cancer, planned duration until disease progression or unacceptable toxicity. Patient is switching therapy due to [facility] patient assistance program ending. PSA continues to be undetectable.   Labs from 04/09/21 (CBC, CMP) assessed, no interventions needed.  Current medication list in Epic reviewed, DDIs with xtandi identified:  - atorvastatin: concentration may be decreased, will monitor. Patient on low dose of atorvastatin.  Evaluated chart and no patient barriers to medication adherence noted.   Patient agreement for treatment documented in MD note on 04/11/21.  Prescription has been e-scribed to the [facility] for benefits analysis and approval.  Oral Oncology Clinic will continue to follow for insurance authorization, copayment issues, initial counseling and start date.  Drema Halon, PharmD Hematology/Oncology Clinical Pharmacist [facility] [phone] 04/16/2021 11:35 AM

## 2021-04-17 ENCOUNTER — Encounter: Payer: Self-pay | Admitting: Gastroenterology

## 2021-04-17 ENCOUNTER — Ambulatory Visit (AMBULATORY_SURGERY_CENTER): Payer: Medicare Other | Admitting: Gastroenterology

## 2021-04-17 VITALS — BP 190/96 | HR 76 | Temp 97.5°F | Resp 15 | Ht 76.0 in | Wt 195.0 lb

## 2021-04-17 DIAGNOSIS — D127 Benign neoplasm of rectosigmoid junction: Secondary | ICD-10-CM | POA: Diagnosis not present

## 2021-04-17 DIAGNOSIS — D509 Iron deficiency anemia, unspecified: Secondary | ICD-10-CM

## 2021-04-17 DIAGNOSIS — D125 Benign neoplasm of sigmoid colon: Secondary | ICD-10-CM

## 2021-04-17 DIAGNOSIS — K562 Volvulus: Secondary | ICD-10-CM

## 2021-04-17 DIAGNOSIS — K648 Other hemorrhoids: Secondary | ICD-10-CM

## 2021-04-17 DIAGNOSIS — K2100 Gastro-esophageal reflux disease with esophagitis, without bleeding: Secondary | ICD-10-CM | POA: Diagnosis not present

## 2021-04-17 DIAGNOSIS — K449 Diaphragmatic hernia without obstruction or gangrene: Secondary | ICD-10-CM

## 2021-04-17 DIAGNOSIS — D122 Benign neoplasm of ascending colon: Secondary | ICD-10-CM | POA: Diagnosis not present

## 2021-04-17 DIAGNOSIS — I10 Essential (primary) hypertension: Secondary | ICD-10-CM | POA: Diagnosis not present

## 2021-04-17 MED ORDER — OMEPRAZOLE 20 MG PO CPDR: 20.0000 mg | DELAYED_RELEASE_CAPSULE | Freq: Every day | ORAL | 3 refills | Status: DC

## 2021-04-17 MED ORDER — SODIUM CHLORIDE 0.9 % IV SOLN: 500.0000 mL | Freq: Once | INTRAVENOUS | Status: DC

## 2021-04-17 NOTE — Progress Notes (Signed)
Called to room to assist during endoscopic procedure.  Patient ID and intended procedure confirmed with present staff. Received instructions for my participation in the procedure from the performing physician.  

## 2021-04-17 NOTE — Op Note (Signed)
[facility] Patient Name: Philip [name] Procedure Date: 04/17/2021 2:59 PM MRN: [id number] Endoscopist: Remo Lipps P. Havery Moros , MD Age: 76 Referring MD:  Date of Birth: [birth year] Gender: Male Account #: [id number] Procedure:                Colonoscopy Indications:              Iron deficiency anemia, history of sigmoid volvulus Medicines:                Monitored Anesthesia Care Procedure:                Pre-Anesthesia Assessment:                           - Prior to the procedure, a History and Physical                            was performed, and patient medications and                            allergies were reviewed. The patient's tolerance of                            previous anesthesia was also reviewed. The risks                            and benefits of the procedure and the sedation                            options and risks were discussed with the patient.                            All questions were answered, and informed consent                            was obtained. Prior Anticoagulants: The patient has                            taken no previous anticoagulant or antiplatelet                            agents. ASA Grade Assessment: III - A patient with                            severe systemic disease. After reviewing the risks                            and benefits, the patient was deemed in                            satisfactory condition to undergo the procedure.                           After obtaining informed consent, the colonoscope  was passed under direct vision. Throughout the                            procedure, the patient's blood pressure, pulse, and                            oxygen saturations were monitored continuously. The                            Colonoscope was introduced through the anus and                            advanced to the the terminal ileum, with                            identification  of the appendiceal orifice and IC                            valve. The colonoscopy was technically difficult                            and complex due to a redundant colon. The patient                            tolerated the procedure well. The quality of the                            bowel preparation was adequate. The terminal ileum,                            ileocecal valve, appendiceal orifice, and rectum                            were photographed. Scope In: 3:18:32 PM Scope Out: 4:03:53 PM Scope Withdrawal Time: 0 hours 30 minutes 32 seconds  Total Procedure Duration: 0 hours 45 minutes 21 seconds  Findings:                 The perianal and digital rectal examinations were                            normal.                           The terminal ileum appeared normal.                           A 3 mm polyp was found in the ascending colon. The                            polyp was sessile. The polyp was removed with a                            cold snare. Resection and retrieval were complete.  A 5 mm polyp was found in the recto-sigmoid colon.                            The polyp was sessile. The polyp was removed with a                            cold snare. Resection and retrieval were complete.                            Surprisingly there was persistent oozing at the                            site after monitoring for a few minutes, three                            hemostatic clips ultimately were successfully                            placed to achieve hemostasis.                           The colon was extremely long and redundant with                            significant looping. Abdominal pressure applied to                            achieve cecal intubation which was quite                            challenging. There was also residual stool                            throughout the colon which took some time to lavage.                            Internal hemorrhoids were found during retroflexion.                           The exam was otherwise without abnormality. Complications:            No immediate complications. Estimated blood loss:                            Minimal. Estimated Blood Loss:     Estimated blood loss was minimal. Impression:               - The examined portion of the ileum was normal.                           - One 3 mm polyp in the ascending colon, removed                            with a cold snare. Resected and retrieved.                           -  One 5 mm polyp at the recto-sigmoid colon,                            removed with a cold snare. Resected and retrieved.                            Clips were placed.                           - Redundant / long colon with significant looping,                            at risk for recurrent volvulus.                           - Internal hemorrhoids.                           - The examination was otherwise normal.                           No cause for iron deficiency anemia on colonoscopy. Recommendation:           - Patient has a contact number available for                            emergencies. The signs and symptoms of potential                            delayed complications were discussed with the                            patient. Return to normal activities tomorrow.                            Written discharge instructions were provided to the                            patient.                           - Resume previous diet.                           - Continue present medications.                           - Await pathology results. No further surveillance                            exams recommended due to age.                           - At risk for recurrent volvulus, patient has  declined surgery in the past for this, can see                            surgery office for follow up if he changes his  mind Carlota Raspberry. Sanjeev Main, MD 04/17/2021 4:11:59 PM This report has been signed electronically.

## 2021-04-17 NOTE — Progress Notes (Signed)
A and O x3. Report to RN. Tolerated MAC anesthesia well. Teeth unchanged after procedure.

## 2021-04-17 NOTE — Op Note (Signed)
**Note Philip-Identified via Obfuscation** Philip [name] Patient Name: Philip [name] Procedure Date: 04/17/2021 3:00 PM MRN: [id number] Endoscopist: Remo Lipps P. Havery Moros , MD Age: 76 Referring MD:  Date of Birth: [birth year] Gender: Male Account #: [id number] Procedure:                Upper GI endoscopy Indications:              Iron deficiency anemia Medicines:                Monitored Anesthesia Care Procedure:                Pre-Anesthesia Assessment:                           - Prior to the procedure, a History and Physical                            was performed, and patient medications and                            allergies were reviewed. The patient's tolerance of                            previous anesthesia was also reviewed. The risks                            and benefits of the procedure and the sedation                            options and risks were discussed with the patient.                            All questions were answered, and informed consent                            was obtained. Prior Anticoagulants: The patient has                            taken no previous anticoagulant or antiplatelet                            agents. ASA Grade Assessment: III - A patient with                            severe systemic disease. After reviewing the risks                            and benefits, the patient was deemed in                            satisfactory condition to undergo the procedure.                           After obtaining informed consent, the endoscope was  passed under direct vision. Throughout the                            procedure, the patient's blood pressure, pulse, and                            oxygen saturations were monitored continuously. The                            Endoscope was introduced through the mouth, and                            advanced to the second part of duodenum. The upper                            GI endoscopy was accomplished  without difficulty.                            The patient tolerated the procedure well. Scope In: Scope Out: Findings:                 Esophagogastric landmarks were identified: the                            Z-line was found at 42 cm, the gastroesophageal                            junction was found at 42 cm and the upper extent of                            the gastric folds was found at 44 cm from the                            incisors. Z line slightly irregular but did not                            meet criteria for Barrett's.                           A 2 cm hiatal hernia was present.                           LA Grade A esophagitis with no bleeding was found.                           The exam of the esophagus was otherwise normal.                           The entire examined stomach was normal. Biopsies                            were taken with a cold forceps for Helicobacter  pylori testing.                           The duodenal bulb and second portion of the                            duodenum were normal. Complications:            No immediate complications. Estimated blood loss:                            Minimal. Estimated Blood Loss:     Estimated blood loss was minimal. Impression:               - Esophagogastric landmarks identified.                           - 2 cm hiatal hernia.                           - LA Grade A reflux esophagitis with no bleeding.                           - Normal stomach. Biopsied.                           - Normal duodenal bulb and second portion of the                            duodenum. Recommendation:           - Patient has a contact number available for                            emergencies. The signs and symptoms of potential                            delayed complications were discussed with the                            patient. Return to normal activities tomorrow.                             Written discharge instructions were provided to the                            patient.                           - Resume previous diet.                           - Continue present medications.                           - Start omeprazole 20mg  / day for 30 days to treat  esophagitis, then use as needed for reflux                           - Await pathology results. Remo Lipps P. Vaudine Dutan, MD 04/17/2021 4:16:40 PM This report has been signed electronically.

## 2021-04-17 NOTE — Progress Notes (Signed)
Pt's states no medical or surgical changes since previsit or office visit. 

## 2021-04-17 NOTE — Progress Notes (Signed)
[facility] History and Physical   Primary Care Physician:  [name], MD   Reason for Procedure:   IDA, history of sigmoid volvulus  Plan:    EGD and colonoscopy     HPI: Philip [name] is a 76 y.o. male  here for EGD and colonoscopy to evaluate IDA. Previously seen as inpatient in January with sigmoid volvulus which was decompressed via flex sig. He declined surgery at that time. NO prior colonoscopy. On iron. Patient denies any bowel symptoms at this time. No family history of colon cancer known. Otherwise feels well without any cardiopulmonary symptoms.    Past Medical History:  Diagnosis Date   Cancer [facility])    PROSTATE   Edema leg Sept 28, 2016   left leg from foot to thigh, increasinly worse over last 4 weeks   Hypertension    Hypoglycemia    Swelling LAST 30 DAYS DR [facility] AWARE   LEFT LEG AND FOOT    Past Surgical History:  Procedure Laterality Date   BOWEL DECOMPRESSION N/A 03/07/2021   Procedure: BOWEL DECOMPRESSION;  Surgeon: Yetta Flock, MD;  Location: [facility];  Service: Gastroenterology;  Laterality: N/A;   FLEXIBLE SIGMOIDOSCOPY N/A 03/07/2021   Procedure: FLEXIBLE SIGMOIDOSCOPY;  Surgeon: Yetta Flock, MD;  Location: [facility];  Service: Gastroenterology;  Laterality: N/A;   GUM SURGERY     TEETH IMPLANTS ALSO   ORCHIECTOMY Bilateral 01/03/2015   Procedure: ORCHIECTOMY;  Surgeon: Alexis Frock, MD;  Location: [facility];  Service: Urology;  Laterality: Bilateral;    Prior to Admission medications   Medication Sig Start Date End Date Taking? Authorizing Provider  abiraterone acetate (ZYTIGA) 250 MG tablet TAKE 4 TABLETS BY MOUTH ONCE DAILY AS DIRECTED.  TAKE 1 HOUR BEFORE OR 2 HOURS AFTER A MEAL 02/19/21  Yes [name], MD  aspirin EC 81 MG tablet Take 81 mg by mouth daily. Swallow whole.   Yes [provider]  atorvastatin (LIPITOR) 10 MG tablet TAKE 1 TABLET BY MOUTH EVERY DAY 02/07/21  Yes [name], MD   BIOTIN 5000 PO Take 1 tablet by mouth daily. 02/02/15  Yes [provider]  CALCIUM-VITAMIN D PO Take 1 tablet by mouth daily.   Yes [provider]  Denosumab (XGEVA [place]) Inject into the skin.   Yes [provider]  enzalutamide Gillermina Phy) 40 MG capsule Take 4 capsules (160 mg total) by mouth daily. 04/11/21  Yes [name], MD  ferrous sulfate 325 (65 FE) MG EC tablet Take 325 mg by mouth daily with breakfast.   Yes [provider]  fexofenadine (ALLEGRA) 180 MG tablet Take 180 mg by mouth daily.   Yes [provider]  hydrOXYzine (ATARAX/VISTARIL) 10 MG tablet Take 10 mg by mouth in the morning and at bedtime. 03/31/18  Yes [provider]  KLOR-CON M20 20 MEQ tablet TAKE 1 TABLET BY MOUTH TWICE A DAY 04/11/21  Yes [name], MD  Multiple Vitamin (MULTIVITAMIN) tablet Take 1 tablet by mouth daily.   Yes [provider]  olmesartan (BENICAR) 40 MG tablet Take 1 tablet (40 mg total) by mouth daily. 03/27/21  Yes [name], MD  polyethylene glycol (MIRALAX / GLYCOLAX) 17 g packet Take 17 g by mouth daily. 03/09/21 06/07/21 Yes [place] Eric J, DO  lidocaine-prilocaine (EMLA) cream Apply 1 application topically as needed. Patient taking differently: Apply 1 application topically as needed (access port). 12/27/18   [name], MD  LORazepam (ATIVAN) 1 MG tablet TAKE 1 TABLET BY MOUTH EVERY 8 HOURS AS NEEDED FOR ANXIETY Patient not taking: Reported on 04/17/2021 02/27/21   [name], MD  azelastine (ASTELIN) 0.1 % nasal spray PLACE 1 SPRAY INTO BOTH NOSTRILS 2 (TWO) TIMES DAILY. USE IN [facility] NOSTRIL AS DIRECTED 09/26/18 07/30/19  [name], MD    Current Outpatient Medications  Medication Sig Dispense Refill   abiraterone acetate (ZYTIGA) 250 MG tablet TAKE 4 TABLETS BY MOUTH ONCE DAILY AS DIRECTED.  TAKE 1 HOUR BEFORE OR 2 HOURS AFTER A MEAL 120 tablet 10   aspirin EC 81 MG tablet Take 81 mg by mouth daily. Swallow  whole.     atorvastatin (LIPITOR) 10 MG tablet TAKE 1 TABLET BY MOUTH EVERY DAY 90 tablet 1   BIOTIN 5000 PO Take 1 tablet by mouth daily.     CALCIUM-VITAMIN D PO Take 1 tablet by mouth daily.     Denosumab (XGEVA [place]) Inject into the skin.     enzalutamide (XTANDI) 40 MG capsule Take 4 capsules (160 mg total) by mouth daily. 120 capsule 0   ferrous sulfate 325 (65 FE) MG EC tablet Take 325 mg by mouth daily with breakfast.     fexofenadine (ALLEGRA) 180 MG tablet Take 180 mg by mouth daily.     hydrOXYzine (ATARAX/VISTARIL) 10 MG tablet Take 10 mg by mouth in the morning and at bedtime.     KLOR-CON M20 20 MEQ tablet TAKE 1 TABLET BY MOUTH TWICE A DAY 180 tablet 0   Multiple Vitamin (MULTIVITAMIN) tablet Take 1 tablet by mouth daily.     olmesartan (BENICAR) 40 MG tablet Take 1 tablet (40 mg total) by mouth daily. 90 tablet 0   polyethylene glycol (MIRALAX / GLYCOLAX) 17 g packet Take 17 g by mouth daily. 30 packet 2   lidocaine-prilocaine (EMLA) cream Apply 1 application topically as needed. (Patient taking differently: Apply 1 application topically as needed (access port).) 30 g 0   LORazepam (ATIVAN) 1 MG tablet TAKE 1 TABLET BY MOUTH EVERY 8 HOURS AS NEEDED FOR ANXIETY (Patient not taking: Reported on 04/17/2021) 90 tablet 0   Current Facility-Administered Medications  Medication Dose Route Frequency Provider Last Rate Last Admin   0.9 %  sodium chloride infusion  500 mL Intravenous Once Jaiyla Granados, Carlota Raspberry, MD        Allergies as of 04/17/2021 - Review Complete 04/17/2021  Allergen Reaction Noted   Prednisone Anxiety 09/07/2017    Family History  Problem Relation Age of Onset   CVA Mother    CAD Father    Osteoarthritis Brother    Colon cancer Neg Hx     Social History   Socioeconomic History   Marital status: Married    Spouse name: Not on file   Number of children: Not on file   Years of education: Not on file   Highest education level: Not on file  Occupational  History   Not on file  Tobacco Use   Smoking status: Never   Smokeless tobacco: Never  Vaping Use   Vaping Use: Never used  Substance and Sexual Activity   Alcohol use: Yes    Alcohol/week: 1.0 standard drink    Types: 1 Glasses of wine per week    Comment: occasional wine with dinner   Drug use: No   Sexual activity: Not on file  Other Topics Concern   Not on file  Social History Narrative   Not  on file   Social Determinants of Health   Financial Resource Strain: Low Risk    Difficulty of Paying Living Expenses: Not hard at all  Food Insecurity: No Food Insecurity   Worried About Charity fundraiser in the Last Year: Never true   Arboriculturist in the Last Year: Never true  Transportation Needs: No Transportation Needs   Lack of Transportation (Medical): No   Lack of Transportation (Non-Medical): No  Physical Activity: Sufficiently Active   Days of Exercise per Week: 5 days   Minutes of Exercise per Session: 30 min  Stress: No Stress Concern Present   Feeling of Stress : Not at all  Social Connections: Moderately Isolated   Frequency of Communication with Friends and Family: More than three times a week   Frequency of Social Gatherings with Friends and Family: More than three times a week   Attends Religious Services: Never   Marine scientist or Organizations: Patient refused   Attends Archivist Meetings: Never   Marital Status: Married  Human resources officer Violence: Not At Risk   Fear of Current or Ex-Partner: No   Emotionally Abused: No   Physically Abused: No   Sexually Abused: No    Review of Systems: All other review of systems negative except as mentioned in the HPI.  Physical Exam: Vital signs BP (!) 185/98    Pulse 72    Temp (!) 97.5 F (36.4 C) (Temporal)    Resp 19    Ht 6\' 4"  (1.93 m)    Wt 195 lb (88.5 kg)    SpO2 100%    BMI 23.74 kg/m   General:   Alert,  Well-developed, pleasant and cooperative in NAD Lungs:  Clear throughout  to auscultation.   Heart:  Regular rate and rhythm Abdomen:  Soft, nontender and nondistended.   Neuro/Psych:  Alert and cooperative. Normal mood and affect. A and O x 3  Jolly Mango, MD [facility]

## 2021-04-17 NOTE — Patient Instructions (Signed)
Please read handouts provided. Continue present medications. Await pathology results. Start omeprazole 20 mg everyday for 30 days, then use as needed for reflux. Clip Card.   YOU HAD AN ENDOSCOPIC PROCEDURE TODAY AT [facility]:   Refer to the procedure report that was given to you for any specific questions about what was found during the examination.  If the procedure report does not answer your questions, please call your gastroenterologist to clarify.  If you requested that your care partner not be given the details of your procedure findings, then the procedure report has been included in a sealed envelope for you to review at your convenience later.  YOU SHOULD EXPECT: Some feelings of bloating in the abdomen. Passage of more gas than usual.  Walking can help get rid of the air that was put into your GI tract during the procedure and reduce the bloating. If you had a lower endoscopy (such as a colonoscopy or flexible sigmoidoscopy) you may notice spotting of blood in your stool or on the toilet paper. If you underwent a bowel prep for your procedure, you may not have a normal bowel movement for a few days.  Please Note:  You might notice some irritation and congestion in your nose or some drainage.  This is from the oxygen used during your procedure.  There is no need for concern and it should clear up in a day or so.  SYMPTOMS TO REPORT IMMEDIATELY:  Following lower endoscopy (colonoscopy or flexible sigmoidoscopy):  Excessive amounts of blood in the stool  Significant tenderness or worsening of abdominal pains  Swelling of the abdomen that is new, acute  Fever of 100F or higher  Following upper endoscopy (EGD)  Vomiting of blood or coffee ground material  New chest pain or pain under the shoulder blades  Painful or persistently difficult swallowing  New shortness of breath  Fever of 100F or higher  Black, tarry-looking stools  For urgent or emergent issues, a  gastroenterologist can be reached at any hour by calling [phone]. Do not use MyChart messaging for urgent concerns.    DIET:  We do recommend a small meal at first, but then you may proceed to your regular diet.  Drink plenty of fluids but you should avoid alcoholic beverages for 24 hours.  ACTIVITY:  You should plan to take it easy for the rest of today and you should NOT DRIVE or use heavy machinery until tomorrow (because of the sedation medicines used during the test).    FOLLOW UP: Our staff will call the number listed on your records 48-72 hours following your procedure to check on you and address any questions or concerns that you may have regarding the information given to you following your procedure. If we do not reach you, we will leave a message.  We will attempt to reach you two times.  During this call, we will ask if you have developed any symptoms of COVID 19. If you develop any symptoms (ie: fever, flu-like symptoms, shortness of breath, cough etc.) before then, please call [phone].  If you test positive for Covid 19 in the 2 weeks post procedure, please call and report this information to [place].    If any biopsies were taken you will be contacted by phone or by letter within the next 1-3 weeks.  Please call us at [phone] if you have not heard about the biopsies in 3 weeks.    SIGNATURES/CONFIDENTIALITY: You and/or your care  partner have signed paperwork which will be entered into your electronic medical record.  These signatures attest to the fact that that the information above on your After Visit Summary has been reviewed and is understood.  Full responsibility of the confidentiality of this discharge information lies with you and/or your care-partner.

## 2021-04-19 ENCOUNTER — Other Ambulatory Visit: Payer: Self-pay | Admitting: Gastroenterology

## 2021-04-19 ENCOUNTER — Telehealth: Payer: Self-pay

## 2021-04-19 NOTE — Telephone Encounter (Signed)
°  Follow up Call-  Call back number 04/17/2021  Post procedure Call Back phone  # [phone]  Permission to leave phone message Yes  Some recent data might be hidden     Patient questions:  Do you have a fever, pain , or abdominal swelling? No. Pain Score  0 *  Have you tolerated food without any problems? Yes.    Have you been able to return to your normal activities? Yes.    Do you have any questions about your discharge instructions: Diet   No. Medications  No. Follow up visit  No.  Do you have questions or concerns about your Care? No.  Actions: * If pain score is 4 or above: No action needed, pain <4.

## 2021-04-23 ENCOUNTER — Other Ambulatory Visit (HOSPITAL_COMMUNITY): Payer: Self-pay

## 2021-04-24 ENCOUNTER — Other Ambulatory Visit: Payer: Self-pay

## 2021-04-24 ENCOUNTER — Encounter: Payer: Self-pay | Admitting: Gastroenterology

## 2021-04-24 DIAGNOSIS — D509 Iron deficiency anemia, unspecified: Secondary | ICD-10-CM

## 2021-04-26 NOTE — Telephone Encounter (Signed)
Philip is approved for Xtandi at no cost from [employer] 04/25/21-03/02/22  Philip [name] uses Philip [name] Philip [name] Phone [phone] Fax [phone] 04/26/2021 10:20 AM

## 2021-05-02 ENCOUNTER — Encounter: Payer: Self-pay | Admitting: Oncology

## 2021-05-02 NOTE — Telephone Encounter (Signed)
Oral Chemotherapy Pharmacist Encounter ? ?I spoke with patient for overview of: Xtandi for the treatment of castration-resistant prostate cancer, planned duration until disease progression or unacceptable toxicity.  ? ?Counseled patient on administration, dosing, side effects, monitoring, drug-food interactions, safe handling, storage, and disposal. ? ?Patient will take Xtandi 40mg  capsules, 4 capsules (160mg ) by mouth once daily without regard to food. ? ?Xtandi start date: 05/14/21 (patient has 12 days of medication left) ?Patient asked me how to take since he has some of the zytiga left. I notified him to finish the zytiga he has and then the following day to take the xtandi.  ? ?Adverse effects include but are not limited to: peripheral edema, GI upset, hypertension, hot flashes, fatigue, falls/fractures, and arthralgias.   ?Patient instructed about small risk of seizures with Xtandi treatment. ? ?Reviewed with patient importance of keeping a medication schedule and plan for any missed doses. No barriers to medication adherence identified. ? ?Medication reconciliation performed and medication/allergy list updated. ? ?Ship broker for Gillermina Phy has been obtained. ?Patient receives medication through patient assistance program.  ? ?Patient informed to reach out to the pharmacy 5-7 days prior to needing next fill of Xtandi to coordinate continued medication acquisition to prevent break in therapy. ? ?All questions answered. ? ?Mr. Po voiced understanding and appreciation.  ? ?Medication education handout placed in mail for patient. Patient knows to call the office with questions or concerns. Oral Chemotherapy Clinic phone number provided to patient.  ? ?Drema Halon, PharmD ?Hematology/Oncology Clinical Pharmacist ?[facility] ?[phone] ?05/02/2021   10:12 AM ? ?

## 2021-05-13 ENCOUNTER — Ambulatory Visit: Payer: Medicare Other | Admitting: Internal Medicine

## 2021-05-27 ENCOUNTER — Encounter: Payer: Self-pay | Admitting: Internal Medicine

## 2021-05-27 ENCOUNTER — Other Ambulatory Visit: Payer: Self-pay | Admitting: Internal Medicine

## 2021-05-27 DIAGNOSIS — D509 Iron deficiency anemia, unspecified: Secondary | ICD-10-CM

## 2021-05-27 MED ORDER — FERROUS SULFATE 325 (65 FE) MG PO TBEC: 325.0000 mg | DELAYED_RELEASE_TABLET | Freq: Every day | ORAL | 1 refills | Status: DC

## 2021-05-28 ENCOUNTER — Other Ambulatory Visit: Payer: Self-pay

## 2021-05-28 ENCOUNTER — Inpatient Hospital Stay: Payer: Medicare Other

## 2021-05-28 DIAGNOSIS — Z95828 Presence of other vascular implants and grafts: Secondary | ICD-10-CM

## 2021-05-28 DIAGNOSIS — C61 Malignant neoplasm of prostate: Secondary | ICD-10-CM | POA: Insufficient documentation

## 2021-05-28 DIAGNOSIS — D509 Iron deficiency anemia, unspecified: Secondary | ICD-10-CM

## 2021-05-28 DIAGNOSIS — C7951 Secondary malignant neoplasm of bone: Secondary | ICD-10-CM | POA: Insufficient documentation

## 2021-05-28 LAB — CBC WITH DIFFERENTIAL (CANCER CENTER ONLY)
Abs Immature Granulocytes: 0.03 10*3/uL (ref 0.00–0.07)
Basophils Absolute: 0 10*3/uL (ref 0.0–0.1)
Basophils Relative: 1 %
Eosinophils Absolute: 0.1 10*3/uL (ref 0.0–0.5)
Eosinophils Relative: 1 %
HCT: 40.1 % (ref 39.0–52.0)
Hemoglobin: 13.4 g/dL (ref 13.0–17.0)
Immature Granulocytes: 1 %
Lymphocytes Relative: 27 %
Lymphs Abs: 1.8 10*3/uL (ref 0.7–4.0)
MCH: 30.3 pg (ref 26.0–34.0)
MCHC: 33.4 g/dL (ref 30.0–36.0)
MCV: 90.7 fL (ref 80.0–100.0)
Monocytes Absolute: 0.7 10*3/uL (ref 0.1–1.0)
Monocytes Relative: 10 %
Neutro Abs: 3.9 10*3/uL (ref 1.7–7.7)
Neutrophils Relative %: 60 %
Platelet Count: 262 10*3/uL (ref 150–400)
RBC: 4.42 MIL/uL (ref 4.22–5.81)
RDW: 13.5 % (ref 11.5–15.5)
WBC Count: 6.5 10*3/uL (ref 4.0–10.5)
nRBC: 0 % (ref 0.0–0.2)

## 2021-05-28 LAB — CMP (CANCER CENTER ONLY)
ALT: 11 U/L (ref 0–44)
AST: 16 U/L (ref 15–41)
Albumin: 4.1 g/dL (ref 3.5–5.0)
Alkaline Phosphatase: 53 U/L (ref 38–126)
Anion gap: 6 (ref 5–15)
BUN: 19 mg/dL (ref 8–23)
CO2: 27 mmol/L (ref 22–32)
Calcium: 9.5 mg/dL (ref 8.9–10.3)
Chloride: 101 mmol/L (ref 98–111)
Creatinine: 0.83 mg/dL (ref 0.61–1.24)
GFR, Estimated: >60 mL/min (ref >60)
Glucose, Bld: 106 mg/dL — ABNORMAL HIGH (ref 70–99)
Potassium: 4.2 mmol/L (ref 3.5–5.1)
Sodium: 134 mmol/L — ABNORMAL LOW (ref 135–145)
Total Bilirubin: 0.5 mg/dL (ref 0.3–1.2)
Total Protein: 6.7 g/dL (ref 6.5–8.1)

## 2021-05-28 LAB — FERRITIN: Ferritin: 176 ng/mL (ref 24–336)

## 2021-05-28 LAB — IRON AND IRON BINDING CAPACITY (CC-WL,HP ONLY)
Iron: 74 ug/dL (ref 45–182)
Saturation Ratios: 20 % (ref 17.9–39.5)
TIBC: 375 ug/dL (ref 250–450)
UIBC: 301 ug/dL (ref 117–376)

## 2021-05-28 MED ORDER — SODIUM CHLORIDE 0.9% FLUSH
10.0000 mL | Freq: Once | INTRAVENOUS | Status: AC
  Administered 2021-05-28: 10 mL

## 2021-05-28 MED ORDER — HEPARIN SOD (PORK) LOCK FLUSH 100 UNIT/ML IV SOLN
500.0000 [IU] | Freq: Once | INTRAVENOUS | Status: AC
  Administered 2021-05-28: 500 [IU]

## 2021-05-29 LAB — PROSTATE-SPECIFIC AG, SERUM (LABCORP): Prostate Specific Ag, Serum: <0.1 ng/mL (ref 0.0–4.0)

## 2021-05-30 ENCOUNTER — Inpatient Hospital Stay: Payer: Medicare Other

## 2021-05-30 ENCOUNTER — Inpatient Hospital Stay: Payer: Medicare Other | Attending: Oncology | Admitting: Oncology

## 2021-05-30 ENCOUNTER — Other Ambulatory Visit: Payer: Self-pay

## 2021-05-30 VITALS — BP 154/90 | HR 76 | Temp 97.7°F | Resp 16 | Ht 76.0 in | Wt 191.9 lb

## 2021-05-30 DIAGNOSIS — C61 Malignant neoplasm of prostate: Secondary | ICD-10-CM | POA: Diagnosis not present

## 2021-05-30 DIAGNOSIS — Z95828 Presence of other vascular implants and grafts: Secondary | ICD-10-CM

## 2021-05-30 DIAGNOSIS — C7951 Secondary malignant neoplasm of bone: Secondary | ICD-10-CM | POA: Diagnosis not present

## 2021-05-30 MED ORDER — DENOSUMAB 120 MG/1.7ML ~~LOC~~ SOLN
120.0000 mg | Freq: Once | SUBCUTANEOUS | Status: AC
  Administered 2021-05-30: 120 mg via SUBCUTANEOUS
  Filled 2021-05-30: qty 1.7

## 2021-05-30 NOTE — Progress Notes (Signed)
Hematology and Oncology Follow Up Visit ? ?Philip [name] ?[id number] ?04-Apr-1945 76 y.o. ?05/30/2021 1:22 PM ?[name], MDJones, [name], MD  ? ?Principle Diagnosis: 76 year old man with advanced prostate cancer with disease to the bone and lymphadenopathy diagnosed in 2016.  He has castration-resistant after presenting with PSA of 2900 at the time of diagnosis. ? ? ?Prior Therapy: ?Status post orchiectomy done on November 2016. ? ?Taxotere chemotherapy at 75 mg/m? to start on 02/16/2015. He is completed 6 cycles of therapy in March 2017. ? ?Zytiga 1000 mg daily started in July 2017.  Therapy discontinued in 2023 due to insurance purposes. ? ?Current therapy: Xtandi 160 mg daily started in February 2023. ? ?Interim History:  [name] presents today for a follow-up evaluation.  Since the last visit, he reports feeling well without any major complaints.  He underwent endoscopy and sigmoidoscopy without any acute pathology.  He did have follow-up but otherwise no other abnormalities.  He denies any constipation, diarrhea or excessive fatigue.  He denies any tiredness or weakness.  He tolerated switching to [place] without any complaints.  He is compliant with his medication without any side effects. ? ?Medications: Updated on review ?Current Outpatient Medications  ?Medication Sig Dispense Refill  ? abiraterone acetate (ZYTIGA) 250 MG tablet TAKE 4 TABLETS BY MOUTH ONCE DAILY AS DIRECTED.  TAKE 1 HOUR BEFORE OR 2 HOURS AFTER A MEAL 120 tablet 10  ? aspirin EC 81 MG tablet Take 81 mg by mouth daily. Swallow whole.    ? atorvastatin (LIPITOR) 10 MG tablet TAKE 1 TABLET BY MOUTH EVERY DAY 90 tablet 1  ? BIOTIN 5000 PO Take 1 tablet by mouth daily.    ? CALCIUM-VITAMIN D PO Take 1 tablet by mouth daily.    ? Denosumab (XGEVA [place]) Inject into the skin.    ? enzalutamide (XTANDI) 40 MG capsule Take 4 capsules (160 mg total) by mouth daily. 120 capsule 0  ? ferrous sulfate 325 (65 FE) MG EC tablet Take 1 tablet (325 mg  total) by mouth daily with breakfast. 90 tablet 1  ? fexofenadine (ALLEGRA) 180 MG tablet Take 180 mg by mouth daily.    ? hydrOXYzine (ATARAX/VISTARIL) 10 MG tablet Take 10 mg by mouth in the morning and at bedtime.    ? KLOR-CON M20 20 MEQ tablet TAKE 1 TABLET BY MOUTH TWICE A DAY 180 tablet 0  ? lidocaine-prilocaine (EMLA) cream Apply 1 application topically as needed. (Patient taking differently: Apply 1 application topically as needed (access port).) 30 g 0  ? LORazepam (ATIVAN) 1 MG tablet TAKE 1 TABLET BY MOUTH EVERY 8 HOURS AS NEEDED FOR ANXIETY (Patient not taking: Reported on 04/17/2021) 90 tablet 0  ? Multiple Vitamin (MULTIVITAMIN) tablet Take 1 tablet by mouth daily.    ? olmesartan (BENICAR) 40 MG tablet Take 1 tablet (40 mg total) by mouth daily. 90 tablet 0  ? omeprazole (PRILOSEC) 20 MG capsule Take 1 capsule (20 mg total) by mouth daily. Take omeprazole 20 mg everyday for 30 days, then use as needed for reflux. 30 capsule 3  ? polyethylene glycol (MIRALAX / GLYCOLAX) 17 g packet Take 17 g by mouth daily. 30 packet 2  ? ?No current facility-administered medications for this visit.  ? ? ? ?Allergies:  ?Allergies  ?Allergen Reactions  ? Prednisone Anxiety  ? ? ? ? ?Physical Exam: ? ? ? ?Blood pressure (!) 154/90, pulse 76, temperature 97.7 ?F (36.5 ?C), temperature source Temporal, resp. rate 16,  height '6\' 4"'$  (1.93 m), weight 191 lb 14.4 oz (87 kg), SpO2 100 %. ? ? ? ? ? ?ECOG: 0 ?  ? ? ?General appearance: Alert, awake without any distress. ?Head: Atraumatic without abnormalities ?Oropharynx: Without any thrush or ulcers. ?Eyes: No scleral icterus. ?Lymph nodes: No lymphadenopathy noted in the cervical, supraclavicular, or axillary nodes ?Heart:regular rate and rhythm, without any murmurs or gallops.   ?Lung: Clear to auscultation without any rhonchi, wheezes or dullness to percussion. ?Abdomin: Soft, nontender without any shifting dullness or ascites. ?Musculoskeletal: No clubbing or  cyanosis. ?Neurological: No motor or sensory deficits. ?Skin: No rashes or lesions. ? ? ? ? ? ? ? ? ? ? ? ? ? ? ? ? ? ? ? ? ? ? ? ? ?Lab Results: ?Lab Results  ?Component Value Date  ? WBC 6.5 05/28/2021  ? HGB 13.4 05/28/2021  ? HCT 40.1 05/28/2021  ? MCV 90.7 05/28/2021  ? PLT 262 05/28/2021  ? ?  Chemistry   ?   ?Component Value Date/Time  ? NA 134 (L) 05/28/2021 1320  ? NA 139 02/10/2017 1323  ? K 4.2 05/28/2021 1320  ? K 3.4 (L) 02/10/2017 1323  ? CL 101 05/28/2021 1320  ? CO2 27 05/28/2021 1320  ? CO2 23 02/10/2017 1323  ? BUN 19 05/28/2021 1320  ? BUN 13.6 02/10/2017 1323  ? CREATININE 0.83 05/28/2021 1320  ? CREATININE 0.9 02/10/2017 1323  ?    ?Component Value Date/Time  ? CALCIUM 9.5 05/28/2021 1320  ? CALCIUM 9.2 02/10/2017 1323  ? ALKPHOS 53 05/28/2021 1320  ? ALKPHOS 50 02/10/2017 1323  ? AST 16 05/28/2021 1320  ? AST 20 02/10/2017 1323  ? ALT 11 05/28/2021 1320  ? ALT 15 02/10/2017 1323  ? BILITOT 0.5 05/28/2021 1320  ? BILITOT 0.52 02/10/2017 1323  ?  ? ? ? ? ? ? ? Latest Reference Range & Units 02/05/21 14:03 04/09/21 13:53 05/28/21 13:20  ?Prostate Specific Ag, Serum 0.0 - 4.0 ng/mL <0.1 <0.1 <0.1  ? ? ? ? ? ? ?Impression and Plan: ? ?76 year old man with: ? ?1.  Advanced prostate cancer with disease to the bone diagnosed in 2016.  He has castration-resistant at this time. ? ?The natural course of this disease was reviewed at this time and treatment options were reiterated.  His PSA continues to be undetectable indicating excellent response to therapy.  Risks and benefits of continuing Xtandi were discussed.  Alternative treatment options including chemotherapy were reviewed and these be deferred to subsequent lines upon progression. ? ? ?2. IV access: Port-A-Cath continues to be in use without any issues. ? ?3. Hypertension: Managed by his primary care physician was his blood pressure mildly elevated today. ? ?4.  Androgen depravation: He is status post orchiectomy without any additional need for  androgen deprivation. ? ?5.  Bone directed therapy: He is currently on Xgeva which she will receive today without any complaints.  Complications including osteonecrosis of the jaw and hypocalcemia were reiterated. ? ?6.  Anemia: Resolved at this time with iron studies and has been repleted. ? ?7.  Sigmoid volvulus: His sigmoidoscopy did not show any acute pathology.  He is deferring the option of colectomy and continues to follow with GI regarding this issue.  I see no oncological reason for this finding at this time. ? ?8. Follow-up: He will return in 2 months for a follow-up evaluation. ? ?30 minutes were spent on this encounter.  The time was dedicated to reviewing  laboratory data, disease status update and outlining future plan of care. ? ?Zola Button, MD ?3/30/20231:22 PM ?

## 2021-06-04 ENCOUNTER — Other Ambulatory Visit: Payer: Self-pay | Admitting: *Deleted

## 2021-06-04 DIAGNOSIS — C61 Malignant neoplasm of prostate: Secondary | ICD-10-CM

## 2021-06-04 MED ORDER — XTANDI 40 MG PO CAPS: 160.0000 mg | ORAL_CAPSULE | Freq: Every day | ORAL | 0 refills | Status: DC

## 2021-06-05 ENCOUNTER — Telehealth: Payer: Self-pay

## 2021-06-05 NOTE — Telephone Encounter (Signed)
-----   Message from Yetta Flock, MD sent at 06/05/2021 12:58 PM EDT ----- ?Regarding: RE: Labs ?Great thanks. Labs look good - anemia resolved, iron normal. He should continue iron and repeat CBC again in 4 months or so. Thanks ? ?----- Message ----- ?From: Yevette Edwards, RN ?Sent: 06/05/2021   9:54 AM EDT ?To: Yetta Flock, MD ?Subject: FW: Labs                                      ? ?Dr. Havery Moros,  ?Pt was due for repeat labs this week. He recently had labs drawn on 05/28/21 at the [facility]. Results are in epic. ?Thanks ?----- Message ----- ?From: Yevette Edwards, RN ?Sent: 06/05/2021  12:00 AM EDT ?To: Yevette Edwards, RN ?Subject: Labs                                          ? ?CBC, IBC + Ferritin. Orders are in epic. ? ? ? ?

## 2021-06-05 NOTE — Telephone Encounter (Signed)
Called and spoke with patient regarding lab results and recommendations. Pt is aware that he needs to continue his iron supplement and we will recheck his labs in about 4 months. Pt verbalized understanding and had no concerns a the end of the call.  ? ?Lab reminder in epic. ?

## 2021-06-06 ENCOUNTER — Telehealth: Payer: Self-pay | Admitting: Gastroenterology

## 2021-06-06 NOTE — Telephone Encounter (Signed)
Patient called stating he thinks his colon has flipped again and wanted someone to look at it so he wouldn't have to go through the long holiday weekend in discomfort.  He is in pain with a lot of bloating which is how the "flipping" started last time.  Please call patient and advise.  Thank you. ?

## 2021-06-06 NOTE — Telephone Encounter (Signed)
Patient has a sigmoid volvulus history and declined surgery after his workup with [place] previously. Relatively recent colonoscopy. He is at risk for recurrent volvulus. Usually symptoms for that would be progressive abdominal distension, pain, not passing gas / stool per rectum. Belching is very nonspecific and not sure what is driving that. If he thinks he has recurrent volvulus based on what he experienced in the past and is concerned about it, then he should go to the ED or urgent for further evaluation - he would need an xray and possible CT scan. It is about to be the holiday weekend, our office closes in one hour and is closed tomorrow and all weekend, ED or urgent care are his only options if this is getting worse. If he is not sure can monitor symptoms, but if progressive distension, not passing gas / stool per rectum, vomiting, pain, etc, he needs to go to the ED. Can take Miralax for constipation or try an enema otherwise. Thanks ?

## 2021-06-06 NOTE — Telephone Encounter (Signed)
Gave pt recommendations. Pt verbalized understanding and stated he would continue to monitor symptoms and if anything got worse he would go to the ED.  ?

## 2021-06-06 NOTE — Telephone Encounter (Signed)
Spoke with pt. Pt states he has had a lot of burping today. Pt denies any abd pain except when he needs to burp and then once he burps pain goes away. Pt states he has not had a bowel movement today and he took the miralax. Last bowel movement was yesterday. Pt states that he ate something spicy yesterday and thought that the burping may be from that so he took a prilosec this morning. Asked pt if he thought that this helped and he wasn't sure. Pt also reports he hasn't passed any gas through his rectum. Pt states that his abd is soft but maybe a little expanded from the burping. Please advise.  ?

## 2021-06-07 ENCOUNTER — Inpatient Hospital Stay (HOSPITAL_COMMUNITY)
Admission: EM | Admit: 2021-06-07 | Discharge: 2021-06-12 | DRG: 331 | Disposition: A | Discharge status: Home / Self Care | Payer: Medicare Other | Attending: Family Medicine | Admitting: Family Medicine

## 2021-06-07 ENCOUNTER — Observation Stay (HOSPITAL_COMMUNITY): Payer: Medicare Other | Admitting: Anesthesiology

## 2021-06-07 ENCOUNTER — Observation Stay (HOSPITAL_BASED_OUTPATIENT_CLINIC_OR_DEPARTMENT_OTHER): Payer: Medicare Other | Admitting: Anesthesiology

## 2021-06-07 ENCOUNTER — Encounter (HOSPITAL_COMMUNITY): Payer: Self-pay | Admitting: Emergency Medicine

## 2021-06-07 ENCOUNTER — Other Ambulatory Visit: Payer: Self-pay

## 2021-06-07 ENCOUNTER — Emergency Department (HOSPITAL_COMMUNITY): Payer: Medicare Other

## 2021-06-07 ENCOUNTER — Encounter (HOSPITAL_COMMUNITY)
Admission: EM | Disposition: A | Discharge status: Home / Self Care | Payer: Self-pay | Source: Home / Self Care | Attending: Internal Medicine

## 2021-06-07 DIAGNOSIS — C61 Malignant neoplasm of prostate: Secondary | ICD-10-CM | POA: Diagnosis present

## 2021-06-07 DIAGNOSIS — D125 Benign neoplasm of sigmoid colon: Secondary | ICD-10-CM | POA: Diagnosis not present

## 2021-06-07 DIAGNOSIS — M419 Scoliosis, unspecified: Secondary | ICD-10-CM | POA: Diagnosis not present

## 2021-06-07 DIAGNOSIS — Z8249 Family history of ischemic heart disease and other diseases of the circulatory system: Secondary | ICD-10-CM | POA: Diagnosis not present

## 2021-06-07 DIAGNOSIS — K219 Gastro-esophageal reflux disease without esophagitis: Secondary | ICD-10-CM | POA: Diagnosis not present

## 2021-06-07 DIAGNOSIS — R933 Abnormal findings on diagnostic imaging of other parts of digestive tract: Secondary | ICD-10-CM | POA: Diagnosis not present

## 2021-06-07 DIAGNOSIS — K562 Volvulus: Secondary | ICD-10-CM | POA: Diagnosis not present

## 2021-06-07 DIAGNOSIS — I700 Atherosclerosis of aorta: Secondary | ICD-10-CM | POA: Diagnosis present

## 2021-06-07 DIAGNOSIS — Z8546 Personal history of malignant neoplasm of prostate: Secondary | ICD-10-CM

## 2021-06-07 DIAGNOSIS — I10 Essential (primary) hypertension: Secondary | ICD-10-CM

## 2021-06-07 DIAGNOSIS — J301 Allergic rhinitis due to pollen: Secondary | ICD-10-CM | POA: Diagnosis present

## 2021-06-07 DIAGNOSIS — D509 Iron deficiency anemia, unspecified: Secondary | ICD-10-CM | POA: Diagnosis present

## 2021-06-07 DIAGNOSIS — Z79899 Other long term (current) drug therapy: Secondary | ICD-10-CM

## 2021-06-07 DIAGNOSIS — E785 Hyperlipidemia, unspecified: Secondary | ICD-10-CM | POA: Diagnosis present

## 2021-06-07 DIAGNOSIS — K5904 Chronic idiopathic constipation: Secondary | ICD-10-CM | POA: Diagnosis present

## 2021-06-07 DIAGNOSIS — R1084 Generalized abdominal pain: Secondary | ICD-10-CM | POA: Diagnosis not present

## 2021-06-07 DIAGNOSIS — R110 Nausea: Secondary | ICD-10-CM | POA: Diagnosis not present

## 2021-06-07 DIAGNOSIS — R109 Unspecified abdominal pain: Secondary | ICD-10-CM | POA: Diagnosis not present

## 2021-06-07 DIAGNOSIS — Z7982 Long term (current) use of aspirin: Secondary | ICD-10-CM | POA: Diagnosis not present

## 2021-06-07 DIAGNOSIS — Z888 Allergy status to other drugs, medicaments and biological substances status: Secondary | ICD-10-CM | POA: Diagnosis not present

## 2021-06-07 DIAGNOSIS — K635 Polyp of colon: Secondary | ICD-10-CM | POA: Diagnosis not present

## 2021-06-07 DIAGNOSIS — K6389 Other specified diseases of intestine: Secondary | ICD-10-CM | POA: Diagnosis not present

## 2021-06-07 HISTORY — PX: FLEXIBLE SIGMOIDOSCOPY: SHX5431

## 2021-06-07 HISTORY — PX: BOWEL DECOMPRESSION: SHX5532

## 2021-06-07 LAB — CBC
HCT: 42.2 % (ref 39.0–52.0)
Hemoglobin: 14.4 g/dL (ref 13.0–17.0)
MCH: 31.2 pg (ref 26.0–34.0)
MCHC: 34.1 g/dL (ref 30.0–36.0)
MCV: 91.3 fL (ref 80.0–100.0)
Platelets: 270 10*3/uL (ref 150–400)
RBC: 4.62 MIL/uL (ref 4.22–5.81)
RDW: 13.7 % (ref 11.5–15.5)
WBC: 11.5 10*3/uL — ABNORMAL HIGH (ref 4.0–10.5)
nRBC: 0 % (ref 0.0–0.2)

## 2021-06-07 LAB — URINALYSIS, ROUTINE W REFLEX MICROSCOPIC
Bilirubin Urine: NEGATIVE
Glucose, UA: NEGATIVE mg/dL
Ketones, ur: NEGATIVE mg/dL
Leukocytes,Ua: NEGATIVE
Nitrite: NEGATIVE
Protein, ur: 30 mg/dL — AB
Specific Gravity, Urine: 1.023 (ref 1.005–1.030)
pH: 6 (ref 5.0–8.0)

## 2021-06-07 LAB — COMPREHENSIVE METABOLIC PANEL
ALT: 15 U/L (ref 0–44)
AST: 19 U/L (ref 15–41)
Albumin: 4 g/dL (ref 3.5–5.0)
Alkaline Phosphatase: 40 U/L (ref 38–126)
Anion gap: 9 (ref 5–15)
BUN: 21 mg/dL (ref 8–23)
CO2: 21 mmol/L — ABNORMAL LOW (ref 22–32)
Calcium: 8.7 mg/dL — ABNORMAL LOW (ref 8.9–10.3)
Chloride: 103 mmol/L (ref 98–111)
Creatinine, Ser: 0.75 mg/dL (ref 0.61–1.24)
GFR, Estimated: >60 mL/min (ref >60)
Glucose, Bld: 119 mg/dL — ABNORMAL HIGH (ref 70–99)
Potassium: 4 mmol/L (ref 3.5–5.1)
Sodium: 133 mmol/L — ABNORMAL LOW (ref 135–145)
Total Bilirubin: 0.7 mg/dL (ref 0.3–1.2)
Total Protein: 7.1 g/dL (ref 6.5–8.1)

## 2021-06-07 LAB — LIPASE, BLOOD: Lipase: 34 U/L (ref 11–51)

## 2021-06-07 SURGERY — SIGMOIDOSCOPY, FLEXIBLE
Anesthesia: Monitor Anesthesia Care

## 2021-06-07 MED ORDER — LORATADINE 10 MG PO TABS
10.0000 mg | ORAL_TABLET | Freq: Every day | ORAL | Status: DC
  Administered 2021-06-07 – 2021-06-11 (×4): 10 mg via ORAL
  Filled 2021-06-07 (×5): qty 1

## 2021-06-07 MED ORDER — ATORVASTATIN CALCIUM 10 MG PO TABS
10.0000 mg | ORAL_TABLET | Freq: Every day | ORAL | Status: DC
  Administered 2021-06-07 – 2021-06-11 (×5): 10 mg via ORAL
  Filled 2021-06-07 (×6): qty 1

## 2021-06-07 MED ORDER — FENTANYL CITRATE PF 50 MCG/ML IJ SOSY
50.0000 ug | PREFILLED_SYRINGE | Freq: Once | INTRAMUSCULAR | Status: AC
  Administered 2021-06-07: 50 ug via INTRAVENOUS
  Filled 2021-06-07: qty 1

## 2021-06-07 MED ORDER — LIDOCAINE-PRILOCAINE 2.5-2.5 % EX CREA: 1.0000 "application " | TOPICAL_CREAM | CUTANEOUS | Status: DC | PRN

## 2021-06-07 MED ORDER — SODIUM CHLORIDE 0.9% FLUSH
10.0000 mL | Freq: Two times a day (BID) | INTRAVENOUS | Status: DC
  Administered 2021-06-09 – 2021-06-10 (×2): 10 mL

## 2021-06-07 MED ORDER — IRBESARTAN 150 MG PO TABS
150.0000 mg | ORAL_TABLET | Freq: Every day | ORAL | Status: DC
Start: 2021-06-07 — End: 2021-06-12
  Administered 2021-06-07 – 2021-06-11 (×5): 150 mg via ORAL
  Filled 2021-06-07 (×7): qty 1

## 2021-06-07 MED ORDER — ENZALUTAMIDE 40 MG PO CAPS
160.0000 mg | ORAL_CAPSULE | Freq: Every day | ORAL | Status: DC
  Administered 2021-06-08 – 2021-06-11 (×3): 160 mg via ORAL

## 2021-06-07 MED ORDER — ONDANSETRON HCL 4 MG PO TABS: 4.0000 mg | ORAL_TABLET | Freq: Four times a day (QID) | ORAL | Status: DC | PRN

## 2021-06-07 MED ORDER — LACTATED RINGERS IV SOLN: INTRAVENOUS | Status: DC

## 2021-06-07 MED ORDER — HYDROXYZINE HCL 10 MG PO TABS
10.0000 mg | ORAL_TABLET | Freq: Every day | ORAL | Status: DC
  Administered 2021-06-08 – 2021-06-11 (×3): 10 mg via ORAL
  Filled 2021-06-07 (×6): qty 1

## 2021-06-07 MED ORDER — ENOXAPARIN SODIUM 40 MG/0.4ML IJ SOSY
40.0000 mg | PREFILLED_SYRINGE | INTRAMUSCULAR | Status: DC
  Administered 2021-06-08 – 2021-06-09 (×2): 40 mg via SUBCUTANEOUS
  Filled 2021-06-07 (×2): qty 0.4

## 2021-06-07 MED ORDER — ONDANSETRON HCL 4 MG/2ML IJ SOLN
4.0000 mg | Freq: Once | INTRAMUSCULAR | Status: AC
  Administered 2021-06-07: 4 mg via INTRAVENOUS
  Filled 2021-06-07: qty 2

## 2021-06-07 MED ORDER — PANTOPRAZOLE SODIUM 40 MG PO TBEC
40.0000 mg | DELAYED_RELEASE_TABLET | Freq: Every day | ORAL | Status: DC
Start: 2021-06-07 — End: 2021-06-12
  Administered 2021-06-07 – 2021-06-12 (×6): 40 mg via ORAL
  Filled 2021-06-07 (×6): qty 1

## 2021-06-07 MED ORDER — LACTATED RINGERS IV SOLN: INTRAVENOUS | Status: AC

## 2021-06-07 MED ORDER — ONDANSETRON HCL 4 MG/2ML IJ SOLN
4.0000 mg | Freq: Four times a day (QID) | INTRAMUSCULAR | Status: DC | PRN
  Administered 2021-06-10: 4 mg via INTRAVENOUS
  Filled 2021-06-07: qty 2

## 2021-06-07 MED ORDER — POLYETHYLENE GLYCOL 3350 17 G PO PACK: 17.0000 g | PACK | Freq: Every day | ORAL | Status: DC | PRN

## 2021-06-07 MED ORDER — CHLORHEXIDINE GLUCONATE CLOTH 2 % EX PADS
6.0000 | MEDICATED_PAD | Freq: Every day | CUTANEOUS | Status: DC
  Administered 2021-06-07 – 2021-06-11 (×5): 6 via TOPICAL

## 2021-06-07 MED ORDER — SODIUM CHLORIDE 0.9% FLUSH: 10.0000 mL | INTRAVENOUS | Status: DC | PRN

## 2021-06-07 MED ORDER — PROPOFOL 500 MG/50ML IV EMUL
INTRAVENOUS | Status: DC | PRN
  Administered 2021-06-07: 20 mg via INTRAVENOUS

## 2021-06-07 MED ORDER — PROPOFOL 500 MG/50ML IV EMUL
INTRAVENOUS | Status: DC | PRN
  Administered 2021-06-07: 150 ug/kg/min via INTRAVENOUS

## 2021-06-07 MED ORDER — LORAZEPAM 1 MG PO TABS: 1.0000 mg | ORAL_TABLET | Freq: Three times a day (TID) | ORAL | Status: DC | PRN

## 2021-06-07 MED ORDER — LACTATED RINGERS IV SOLN
INTRAVENOUS | Status: DC
  Administered 2021-06-07: 1000 mL via INTRAVENOUS

## 2021-06-07 MED ORDER — POTASSIUM CHLORIDE CRYS ER 20 MEQ PO TBCR
20.0000 meq | EXTENDED_RELEASE_TABLET | Freq: Two times a day (BID) | ORAL | Status: DC
  Administered 2021-06-07 – 2021-06-12 (×9): 20 meq via ORAL
  Filled 2021-06-07 (×9): qty 1

## 2021-06-07 MED ORDER — ACETAMINOPHEN 325 MG PO TABS: 650.0000 mg | ORAL_TABLET | Freq: Four times a day (QID) | ORAL | Status: DC | PRN

## 2021-06-07 MED ORDER — ACETAMINOPHEN 650 MG RE SUPP: 650.0000 mg | Freq: Four times a day (QID) | RECTAL | Status: DC | PRN

## 2021-06-07 MED ORDER — LACTATED RINGERS IV BOLUS
1000.0000 mL | Freq: Once | INTRAVENOUS | Status: AC
  Administered 2021-06-07: 1000 mL via INTRAVENOUS

## 2021-06-07 NOTE — Plan of Care (Signed)

## 2021-06-07 NOTE — Transfer of Care (Signed)
Immediate Anesthesia Transfer of Care Note ? ?Patient: Philip [name] ? ?Procedure(s) Performed: FLEXIBLE SIGMOIDOSCOPY ?BOWEL DECOMPRESSION ? ?Patient Location: PACU ? ?Anesthesia Type:MAC ? ?Level of Consciousness: awake, alert  and oriented ? ?Airway & Oxygen Therapy: Patient Spontanous Breathing and Patient connected to face mask oxygen ? ?Post-op Assessment: Report given to RN and Post -op Vital signs reviewed and stable ? ?Post vital signs: Reviewed and stable ? ?Last Vitals:  ?Vitals Value Taken Time  ?BP 130/77 06/07/21 1230  ?Temp 36.5 ?C 06/07/21 1230  ?Pulse 75 06/07/21 1232  ?Resp 15 06/07/21 1232  ?SpO2 100 % 06/07/21 1232  ?Vitals shown include unvalidated device data. ? ?Last Pain:  ?Vitals:  ? 06/07/21 1230  ?TempSrc: Temporal  ?PainSc: 0-No pain  ?   ? ?  ? ?Complications: No notable events documented. ?

## 2021-06-07 NOTE — H&P (Signed)
?History and Physical  ? ? ?Patient: Philip [name] XVQ:[id number] DOB: Dec 26, 1945 ?DOA: 06/07/2021 ?DOS: the patient was seen and examined on 06/07/2021 ?PCP: [name], MD  ?Patient coming from: Home ? ?Chief Complaint:  ?Chief Complaint  ?Patient presents with  ? Abdominal Pain  ? ?HPI: Philip [name] is a 76 y.o. male with medical history significant of prostate cancer, hypertension, hyperglycemia, aortic atherosclerosis, chronic idiopathic constipation, GERD, hyperlipidemia, drug-induced hypokalemia, iron deficiency anemia, history of volvulus in January this year requiring flexible sigmoidoscopy decompression by Dr. Havery Moros, declining at that time seem avoid colectomy who is is coming to the emergency department with constipation, abdominal pain and distention since yesterday.  He tried MiraLAX without any significant relief.  He had an episode of emesis yesterday.  This morning, the symptoms continued to worsen and he called Dr. Havery Moros who was unable to see him today and recommended for him to come to the hospital.  He denied diarrhea, melena or hematochezia.  No flank pain, dysuria, frequency hematuria.  No fever, chills, sore throat, rhinorrhea, dyspnea, wheezing or hemoptysis.  No chest pain, palpitations, diaphoresis, PND, orthopnea or pitting edema of the lower extremities.  No polyuria, polydipsia, polyphagia or blurred vision. ? ?ED course: Initial vital signs were temperature 98.3 ?F, pulse 99, respiration 18, BP 188/121 mmHg O2 sat 99% on room air.  The patient received fentanyl 50 mcg IV x2 1000 mL of LR bolus, ondansetron 4 mg IVP.  GI and general surgery are consulting.  He was taken to the endoscopic suite for emergent flexible sigmoidoscopy accomplished and resolution of the volvulus.  Dr. Redmond Pulling from general surgery is planning to perform a sigmoid colectomy during this hospitalization. ? ?Lab work: His urinalysis shows small hemoglobinuria, proteinuria 30 mg/dL and rare bacteria.  CBC 0 white  count 11.5, hemoglobin 14.4 g/dL platelets 270.  CMP showed a sodium 133 and CO2 of 21 mmol/L.  Glucose 119 and calcium 8.7 mg/dL.  The rest of the CMP values and a lipase level were normal. ? ?Imaging: Three-way abdomen showed recurrent sigmoid volvulus. ? ?Review of Systems: As mentioned in the history of present illness. All other systems reviewed and are negative. ?Past Medical History:  ?Diagnosis Date  ? Cancer [facility])   ? PROSTATE  ? Edema leg Sept 28, 2016  ? left leg from foot to thigh, increasinly worse over last 4 weeks  ? Hypertension   ? Hypoglycemia   ? Swelling LAST 30 DAYS DR [facility] AWARE  ? LEFT LEG AND FOOT  ? ?Past Surgical History:  ?Procedure Laterality Date  ? BOWEL DECOMPRESSION N/A 03/07/2021  ? Procedure: BOWEL DECOMPRESSION;  Surgeon: Yetta Flock, MD;  Location: [facility];  Service: Gastroenterology;  Laterality: N/A;  ? FLEXIBLE SIGMOIDOSCOPY N/A 03/07/2021  ? Procedure: FLEXIBLE SIGMOIDOSCOPY;  Surgeon: Yetta Flock, MD;  Location: [facility];  Service: Gastroenterology;  Laterality: N/A;  ? GUM SURGERY    ? TEETH IMPLANTS ALSO  ? ORCHIECTOMY Bilateral 01/03/2015  ? Procedure: ORCHIECTOMY;  Surgeon: Alexis Frock, MD;  Location: [facility];  Service: Urology;  Laterality: Bilateral;  ? ?Social History:  reports that he has never smoked. He has never used smokeless tobacco. He reports current alcohol use of about 1.0 standard drink per week. He reports that he does not use drugs. ? ?Allergies  ?Allergen Reactions  ? Prednisone Anxiety  ? ? ?Family History  ?Problem Relation Age of Onset  ? CVA Mother   ? CAD Father   ? Osteoarthritis  Brother   ? Colon cancer Neg Hx   ? ? ?Prior to Admission medications   ?Medication Sig Start Date End Date Taking? Authorizing Provider  ?abiraterone acetate (ZYTIGA) 250 MG tablet TAKE 4 TABLETS BY MOUTH ONCE DAILY AS DIRECTED.  TAKE 1 HOUR BEFORE OR 2 HOURS AFTER A MEAL ?Patient not taking: Reported on 05/30/2021 02/19/21   [name], MD   ?aspirin EC 81 MG tablet Take 81 mg by mouth daily. Swallow whole.    [provider]  ?atorvastatin (LIPITOR) 10 MG tablet TAKE 1 TABLET BY MOUTH EVERY DAY 02/07/21   [name], MD  ?BIOTIN 5000 PO Take 1 tablet by mouth daily. 02/02/15   [provider]  ?CALCIUM-VITAMIN D PO Take 1 tablet by mouth daily.    [provider]  ?Denosumab (XGEVA [place]) Inject into the skin.    [provider]  ?enzalutamide Gillermina Phy) 40 MG capsule Take 4 capsules (160 mg total) by mouth daily. 06/04/21   [name], MD  ?ferrous sulfate 325 (65 FE) MG EC tablet Take 1 tablet (325 mg total) by mouth daily with breakfast. 05/27/21   [name], MD  ?fexofenadine (ALLEGRA) 180 MG tablet Take 180 mg by mouth daily.    [provider]  ?hydrOXYzine (ATARAX/VISTARIL) 10 MG tablet Take 10 mg by mouth in the morning and at bedtime. 03/31/18   [provider]  ?KLOR-CON M20 20 MEQ tablet TAKE 1 TABLET BY MOUTH TWICE A DAY 04/11/21   [name], MD  ?lidocaine-prilocaine (EMLA) cream Apply 1 application topically as needed. ?Patient taking differently: Apply 1 application topically as needed (access port). 12/27/18   [name], MD  ?LORazepam (ATIVAN) 1 MG tablet TAKE 1 TABLET BY MOUTH EVERY 8 HOURS AS NEEDED FOR ANXIETY ?Patient not taking: Reported on 04/17/2021 02/27/21   [name], MD  ?Multiple Vitamin (MULTIVITAMIN) tablet Take 1 tablet by mouth daily.    [provider]  ?olmesartan (BENICAR) 40 MG tablet Take 1 tablet (40 mg total) by mouth daily. 03/27/21   [name], MD  ?omeprazole (PRILOSEC) 20 MG capsule Take 1 capsule (20 mg total) by mouth daily. Take omeprazole 20 mg everyday for 30 days, then use as needed for reflux. 04/17/21   Armbruster, Carlota Raspberry, MD  ?polyethylene glycol (MIRALAX / GLYCOLAX) 17 g packet Take 17 g by mouth daily. 03/09/21 06/07/21  [place] Donnamarie Poag, DO  ?azelastine (ASTELIN) 0.1 % nasal spray PLACE 1 SPRAY INTO BOTH NOSTRILS  2 (TWO) TIMES DAILY. USE IN [facility] NOSTRIL AS DIRECTED 09/26/18 07/30/19  [name], MD  ? ? ?Physical Exam: ?Vitals:  ? 06/07/21 0900 06/07/21 1014 06/07/21 1142  ?BP: (!) 188/121 (!) 187/108 (!) 180/95  ?Pulse: 99 80 77  ?Resp: '18 18 16  '$ ?Temp: 98.3 ?F (36.8 ?C) 98 ?F (36.7 ?C) 97.7 ?F (36.5 ?C)  ?TempSrc: Oral Oral Temporal  ?SpO2: 99% 100% 100%  ?Weight:  87 kg 86.6 kg  ?Height:  '6\' 4"'$  (1.93 m) '6\' 4"'$  (1.93 m)  ? ?Physical Exam ?Vitals and nursing note reviewed.  ?Constitutional:   ?   Appearance: He is well-developed and normal weight.  ?HENT:  ?   Head: Normocephalic.  ?   Mouth/Throat:  ?   Mouth: Mucous membranes are moist.  ?Eyes:  ?   General: No scleral icterus. ?   Pupils: Pupils are equal, round, and reactive to light.  ?Neck:  ?   Vascular:  No JVD.  ?Cardiovascular:  ?   Rate and Rhythm: Normal rate and regular rhythm.  ?   Heart sounds: S1 normal and S2 normal.  ?Pulmonary:  ?   Effort: Pulmonary effort is normal.  ?   Breath sounds: Normal breath sounds.  ?Abdominal:  ?   General: Bowel sounds are normal. There is no distension.  ?   Palpations: Abdomen is soft. There is no hepatomegaly or splenomegaly.  ?   Tenderness: There is no abdominal tenderness. There is no right CVA tenderness or left CVA tenderness.  ?Musculoskeletal:  ?   Cervical back: Neck supple.  ?   Right lower leg: No edema.  ?   Left lower leg: No edema.  ?Skin: ?   General: Skin is warm and dry.  ?Neurological:  ?   General: No focal deficit present.  ?   Mental Status: He is alert and oriented to person, place, and time.  ?Psychiatric:     ?   Mood and Affect: Mood normal.     ?   Behavior: Behavior normal.  ? ?Data Reviewed: ? ?There are no new results to review at this time. ? ?Assessment and Plan: ?Principal Problem: ?  Sigmoid volvulus ([place]) ?S/P flexible sigmoidoscopy decompression. ?Admit to telemetry/inpatient. ?Clear liquid diet. ?May advance to full liquid diet. ?Do not advance beyond full liquid diet. ?Analgesics as  needed. ?Antiemetics as needed. ?Continue gentle IV fluids. ?Gastroenterology consult/procedure appreciated. ?General surgery consult appreciated. ? ?Active Problems: ?  Prostate cancer ([place]) ?Continue enz

## 2021-06-07 NOTE — Anesthesia Preprocedure Evaluation (Signed)
Anesthesia Evaluation  ?Patient identified by MRN, date of birth, ID band ?Patient awake ? ? ? ?Reviewed: ?Allergy & Precautions, NPO status , Patient's Chart, lab work & pertinent test results ? ?Airway ?Mallampati: II ? ?TM Distance: >3 FB ?Neck ROM: Full ? ? ? Dental ?no notable dental hx. ?(+) Teeth Intact, Dental Advisory Given ?  ?Pulmonary ?neg pulmonary ROS,  ?  ?Pulmonary exam normal ?breath sounds clear to auscultation ? ? ? ? ? ? Cardiovascular ?hypertension, Pt. on medications ?Normal cardiovascular exam ?Rhythm:Regular Rate:Normal ? ? ?  ?Neuro/Psych ?negative neurological ROS ? negative psych ROS  ? GI/Hepatic ?Neg liver ROS, GERD  Medicated and Controlled,  ?Endo/Other  ?negative endocrine ROSK 2.3, currently being repleted, EKG wnl, no symptoms ? Renal/GU ?negative Renal ROS  ? ?Prostate CA ?negative genitourinary ?  ?Musculoskeletal ?negative musculoskeletal ROS ?(+)  ? Abdominal ?Normal abdominal exam  (+)   ?Peds ? Hematology ?negative hematology ROS ?(+)   ?Anesthesia Other Findings ?Sigmoid volvulus ? Reproductive/Obstetrics ? ?  ? ? ? ? ? ? ? ? ? ? ? ? ? ?  ?  ? ? ? ? ? ? ? ? ?Anesthesia Physical ? ?Anesthesia Plan ? ?ASA: 2 ? ?Anesthesia Plan: MAC  ? ?Post-op Pain Management: Minimal or no pain anticipated  ? ?Induction: Intravenous ? ?PONV Risk Score and Plan: 1 and Propofol infusion, Treatment may vary due to age or medical condition and TIVA ? ?Airway Management Planned: Natural Airway and Simple Face Mask ? ?Additional Equipment: None ? ?Intra-op Plan:  ? ?Post-operative Plan:  ? ?Informed Consent: I have reviewed the patients History and Physical, chart, labs and discussed the procedure including the risks, benefits and alternatives for the proposed anesthesia with the patient or authorized representative who has indicated his/her understanding and acceptance.  ? ? ? ? ? ?Plan Discussed with: CRNA ? ?Anesthesia Plan Comments:   ? ? ? ? ? ? ?Anesthesia  Quick Evaluation ? ?

## 2021-06-07 NOTE — Op Note (Signed)
[facility] ?Patient Name: Philip [name] ?Procedure Date: 06/07/2021 ?MRN: [id number] ?Attending MD: Carol Ada , MD ?Date of Birth: [birth year] ?CSN: [id number] ?Age: 76 ?Admit Type: Inpatient ?Procedure:                Flexible Sigmoidoscopy ?Indications:              For therapy of volvulus ?Providers:                Carol Ada, MD, Grace Isaac, RN, Julious Oka  ?                          [place], RN, [employer], Merchant navy officer ?Referring MD:              ?Medicines:                Propofol per Anesthesia ?Complications:            No immediate complications. ?Estimated Blood Loss:     Estimated blood loss: none. ?Procedure:                Pre-Anesthesia Assessment: ?                          - Prior to the procedure, a History and Physical  ?                          was performed, and patient medications and  ?                          allergies were reviewed. The patient's tolerance of  ?                          previous anesthesia was also reviewed. The risks  ?                          and benefits of the procedure and the sedation  ?                          options and risks were discussed with the patient.  ?                          All questions were answered, and informed consent  ?                          was obtained. Prior Anticoagulants: The patient has  ?                          taken no previous anticoagulant or antiplatelet  ?                          agents. ASA Grade Assessment: II - A patient with  ?                          mild systemic disease. After reviewing the risks  ?  and benefits, the patient was deemed in  ?                          satisfactory condition to undergo the procedure. ?                          - Sedation was administered by an anesthesia  ?                          professional. Deep sedation was attained. ?                          After obtaining informed consent, the scope was  ?                          passed under  direct vision. The PCF-HQ190L  ?                          ([id number]) Olympus colonoscope was introduced  ?                          through the anus and advanced to the the sigmoid  ?                          colon. The flexible sigmoidoscopy was accomplished  ?                          without difficulty. The patient tolerated the  ?                          procedure well. The quality of the bowel  ?                          preparation was adequate. ?Scope In: ?Scope Out: ?Findings: ?     A volvulus with viable appearing mucosa was found in the sigmoid colon.  ?     Decompression of the volvulus was attempted and was successful, with  ?     complete decompression achieved. Following the maneuver, a tube was  ?     placed to maintain the decompression. ?     Insertion of the colonoscope to and through the volvulus was performed  ?     with ease. Proximal to the stenosis the lumen was markedly distended.  ?     The mucosa was viable proximal and distal to the stenosed region.  ?     Decompression was confirmed visually and with external palpation of the  ?     abdomen. A rectal tube was inserted along side the colonoscope and  ?     visual confirmation of placement was obtained. Drainage from the tube  ?     was achieved. Successful decompression was performed and the procedure  ?     was terminated. ?Impression:               - Volvulus. Successful complete decompression  ?  achieved. ?                          - No specimens collected. ?Moderate Sedation: ?     Not Applicable - Patient had care per Anesthesia. ?Recommendation:           - Return patient to hospital ward for ongoing care. ?                          - NPO. ?                          - Surgical resection. ?Procedure Code(s):        --- Professional --- ?                          [id number], Sigmoidoscopy, flexible; with decompression  ?                          (for pathologic distention) (eg, volvulus,  ?                           megacolon), including placement of decompression  ?                          tube, when performed ?Diagnosis Code(s):        --- Professional --- ?                          K56.2, Volvulus ?CPT copyright 2019 [employer]. All rights reserved. ?The codes documented in this report are preliminary and upon coder review may  ?be revised to meet current compliance requirements. ?Carol Ada, MD ?Carol Ada, MD ?06/07/2021 12:52:49 PM ?This report has been signed electronically. ?Number of Addenda: 0 ?

## 2021-06-07 NOTE — ED Triage Notes (Signed)
Patient c/o abdominal pain since yesterday. Hx volvulus in January. Prostate cancer history, taking oral chemo. ?

## 2021-06-07 NOTE — Consult Note (Signed)
? ?CC: My colon twisted again ? ?Requesting provider: Dr. Regenia Skeeter ? ?HPI: ?Philip [name] is an 76 y.o. male who is here for recurrent sigmoid volvulus.  Patient has a past medical history of sigmoid volvulus status post flexible sigmoidoscopy decompression on March 07, 2021.  He followed up with Dr. Havery Moros for a full colonoscopy on February 15 and had a few benign polyps removed.  He had declined sigmoid colectomy during his initial hospitalization back in January due to work obligations.  His last good bowel movement was on Wednesday and then he started feeling unwell on Thursday.  He took MiraLAX without relief.  The pain was persistent and severe so he presented to the emergency room where a plain film demonstrated recurrent sigmoid volvulus.  He was taken urgently to endoscopy and underwent decompression by Dr. Benson Norway successively just a short time ago. ? ?The patient has chronic prostate cancer status post orchiectomy as well as hypertension.  He is on oral medication for his chronic metastasized pancreatic cancer to his bones. ? ?No prior abdominal surgery. ? ?Substance use: occasional alcohol use, never smoker ?Blood thinners: none ? ?Past Medical History:  ?Diagnosis Date  ? Cancer [facility])   ? PROSTATE  ? Edema leg Sept 28, 2016  ? left leg from foot to thigh, increasinly worse over last 4 weeks  ? Hypertension   ? Hypoglycemia   ? Swelling LAST 30 DAYS DR [facility] AWARE  ? LEFT LEG AND FOOT  ? ? ?Past Surgical History:  ?Procedure Laterality Date  ? BOWEL DECOMPRESSION N/A 03/07/2021  ? Procedure: BOWEL DECOMPRESSION;  Surgeon: Yetta Flock, MD;  Location: [facility];  Service: Gastroenterology;  Laterality: N/A;  ? FLEXIBLE SIGMOIDOSCOPY N/A 03/07/2021  ? Procedure: FLEXIBLE SIGMOIDOSCOPY;  Surgeon: Yetta Flock, MD;  Location: [facility];  Service: Gastroenterology;  Laterality: N/A;  ? GUM SURGERY    ? TEETH IMPLANTS ALSO  ? ORCHIECTOMY Bilateral 01/03/2015  ? Procedure: ORCHIECTOMY;   Surgeon: Alexis Frock, MD;  Location: [facility];  Service: Urology;  Laterality: Bilateral;  ? ? ?Family History  ?Problem Relation Age of Onset  ? CVA Mother   ? CAD Father   ? Osteoarthritis Brother   ? Colon cancer Neg Hx   ? ? ?Social:  reports that he has never smoked. He has never used smokeless tobacco. He reports current alcohol use of about 1.0 standard drink per week. He reports that he does not use drugs. ? ?Allergies:  ?Allergies  ?Allergen Reactions  ? Prednisone Anxiety  ? ? ?Medications: I have reviewed the patient's current medications. ? ? ?ROS - all of the below systems have been reviewed with the patient and positives are indicated with bold text ?General: chills, fever or night sweats ?Eyes: blurry vision or double vision ?ENT: epistaxis or sore throat ?Allergy/Immunology: itchy/watery eyes or nasal congestion ?Hematologic/Lymphatic: bleeding problems, blood clots or swollen lymph nodes ?Endocrine: temperature intolerance or unexpected weight changes ?Breast: new or changing breast lumps or nipple discharge ?Resp: cough, shortness of breath, or wheezing ?CV: chest pain or dyspnea on exertion ?GI: as per HPI ?GU: dysuria, trouble voiding, or hematuria ?MSK: joint pain or joint stiffness ?Neuro: TIA or stroke symptoms ?Derm: pruritus and skin lesion changes ?Psych: anxiety and depression ? ?PE ?Blood pressure (!) 167/86, pulse 69, temperature 97.7 ?F (36.5 ?C), temperature source Temporal, resp. rate 13, height '6\' 4"'$  (1.93 m), weight 86.6 kg, SpO2 100 %. ?Constitutional: NAD; conversant; no deformities; patient seen in endoscopy suite in  recovery ?Eyes: Moist conjunctiva; no lid lag; anicteric; PERRL ?Neck: Trachea midline; no thyromegaly ?Lungs: Normal respiratory effort; no tactile fremitus ?CV: RRR; no palpable thrills; no pitting edema ?GI: Abd soft, some distention, nontender; no palpable hepatosplenomegaly ?MSK:  no clubbing/cyanosis ?Psychiatric: Appropriate affect; alert and oriented  x3 ?Lymphatic: No palpable cervical or axillary lymphadenopathy ?Skin: No rash, lesions or jaundice ? ?Results for orders placed or performed during the hospital encounter of 06/07/21 (from the past 48 hour(s))  ?CBC     Status: Abnormal  ? Collection Time: 06/07/21  9:34 AM  ?Result Value Ref Range  ? WBC 11.5 (H) 4.0 - 10.5 K/uL  ? RBC 4.62 4.22 - 5.81 MIL/uL  ? Hemoglobin 14.4 13.0 - 17.0 g/dL  ? HCT 42.2 39.0 - 52.0 %  ? MCV 91.3 80.0 - 100.0 fL  ? MCH 31.2 26.0 - 34.0 pg  ? MCHC 34.1 30.0 - 36.0 g/dL  ? RDW 13.7 11.5 - 15.5 %  ? Platelets 270 150 - 400 K/uL  ? nRBC 0.0 0.0 - 0.2 %  ?  Comment: Performed at [facility], [place] [address] [place], [place] [address]  ?Urinalysis, Routine w reflex microscopic Urine, Clean Catch     Status: Abnormal  ? Collection Time: 06/07/21  9:36 AM  ?Result Value Ref Range  ? Color, Urine YELLOW YELLOW  ? APPearance CLEAR CLEAR  ? Specific Gravity, Urine 1.023 1.005 - 1.030  ? pH 6.0 5.0 - 8.0  ? Glucose, UA NEGATIVE NEGATIVE mg/dL  ? Hgb urine dipstick SMALL (A) NEGATIVE  ? Bilirubin Urine NEGATIVE NEGATIVE  ? Ketones, ur NEGATIVE NEGATIVE mg/dL  ? Protein, ur 30 (A) NEGATIVE mg/dL  ? Nitrite NEGATIVE NEGATIVE  ? Leukocytes,Ua NEGATIVE NEGATIVE  ? RBC / HPF 11-20 0 - 5 RBC/hpf  ? WBC, UA 0-5 0 - 5 WBC/hpf  ? Bacteria, UA RARE (A) NONE SEEN  ? Squamous Epithelial / LPF 0-5 0 - 5  ? Mucus PRESENT   ?  Comment: Performed at [facility], [place] [address], [place], [place] [address]  ?Comprehensive metabolic panel     Status: Abnormal  ? Collection Time: 06/07/21 10:08 AM  ?Result Value Ref Range  ? Sodium 133 (L) 135 - 145 mmol/L  ? Potassium 4.0 3.5 - 5.1 mmol/L  ? Chloride 103 98 - 111 mmol/L  ? CO2 21 (L) 22 - 32 mmol/L  ? Glucose, Bld 119 (H) 70 - 99 mg/dL  ?  Comment: Glucose reference range applies only to samples taken after fasting for at least 8 hours.  ? BUN 21 8 - 23 mg/dL  ? Creatinine, Ser 0.75 0.61 - 1.24 mg/dL  ? Calcium 8.7 (L)  8.9 - 10.3 mg/dL  ? Total Protein 7.1 6.5 - 8.1 g/dL  ? Albumin 4.0 3.5 - 5.0 g/dL  ? AST 19 15 - 41 U/L  ? ALT 15 0 - 44 U/L  ? Alkaline Phosphatase 40 38 - 126 U/L  ? Total Bilirubin 0.7 0.3 - 1.2 mg/dL  ? GFR, Estimated >60 >60 mL/min  ?  Comment: (NOTE) ?Calculated using the CKD-EPI Creatinine Equation (2021) ?  ? Anion gap 9 5 - 15  ?  Comment: Performed at [facility], [place] [address] [place], [place] [address]  ?Lipase, blood     Status: None  ? Collection Time: 06/07/21 10:08 AM  ?Result Value Ref Range  ? Lipase 34 11 - 51 U/L  ?  Comment: Performed at [facility], [place] [address] [place], [place] [address]  ? ? ?DG ABD ACUTE 2+V W 1V CHEST ? ?Result Date: 06/07/2021 ?CLINICAL DATA:  Abdominal pain and nausea, history of volvulus EXAM: DG ABDOMEN ACUTE WITH 1 VIEW CHEST COMPARISON:  KUB and CT abdomen/pelvis 03/07/2021 FINDINGS: Chest: A right chest wall port is in place. The cardiomediastinal silhouette is normal. There is no focal consolidation or pulmonary edema. There is no pleural effusion or pneumothorax Abdomen: There is a loop of large bowel in the mid abdomen which is dilated measuring up to 11.8 cm with a layering fluid level. There is no evidence of free intraperitoneal air. There is no abnormal soft tissue calcification. There is no acute osseous abnormality. There is levoscoliosis of the lumbar spine. IMPRESSION: Findings above concerning for recurrent sigmoid volvulus. Recommend surgical consultation. These results were called by telephone at the time of interpretation on 06/07/2021 at 11:06 am to provider Sherwood Gambler , who verbally acknowledged these results. Electronically Signed   By: Valetta Mole M.D.   On: 06/07/2021 11:03   ? ?Imaging: ?Personally reviewed ? ?Data reviewed: ?Reviewed CT scan from January, prior consultation notes, colonoscopy and upper endoscopy report from February, telephone messages from Dr. Havery Moros, ER records from today, Dr.  Ulyses Amor flexible sigmoidoscopy report, labs and today's imaging ? ?A/P: ?Philip [name] is an 76 y.o. male with  ?Recurrent sigmoid volvulus status post decompression 4/7 by Dr. Benson Norway ?Hypertension ?Advanced prostate c

## 2021-06-07 NOTE — ED Notes (Signed)
Patient has a urine culture in the main lab 

## 2021-06-07 NOTE — Anesthesia Postprocedure Evaluation (Signed)
Anesthesia Post Note ? ?Patient: Philip [name] ? ?Procedure(s) Performed: FLEXIBLE SIGMOIDOSCOPY ?BOWEL DECOMPRESSION ? ?  ? ?Patient location during evaluation: Endoscopy ?Anesthesia Type: MAC ?Level of consciousness: awake ?Pain management: pain level controlled ?Vital Signs Assessment: post-procedure vital signs reviewed and stable ?Respiratory status: spontaneous breathing ?Cardiovascular status: stable ?Postop Assessment: no apparent nausea or vomiting ?Anesthetic complications: no ? ? ?No notable events documented. ? ?Last Vitals:  ?Vitals:  ? 06/07/21 1230 06/07/21 1237  ?BP: 130/77   ?Pulse: 76 74  ?Resp: 13 15  ?Temp: 36.5 ?C   ?SpO2: 100% 100%  ?  ?Last Pain:  ?Vitals:  ? 06/07/21 1230  ?TempSrc: Temporal  ?PainSc: 0-No pain  ? ? ?  ?  ?  ?  ?  ?  ? ?Huston Foley ? ? ? ? ?

## 2021-06-07 NOTE — ED Provider Notes (Signed)
?[facility] DEPT ?Provider Note ? ? ?CSN: [id number] ?Arrival date & time: 06/07/21  0854 ? ?  ? ?History ? ?Chief Complaint  ?Patient presents with  ? Abdominal Pain  ? ? ?Philip [name] is a 76 y.o. male. ? ?HPI ?76 year old male presents with abdominal pain.  Started yesterday.  He has a history of a sigmoid volvulus that was decompressed back in January and he states this feels similar but also worse.  He vomited once last night and has been having some belching and has not passed any gas.  Feels like his abdomen is swollen.  He has not noticed a fever.  He rates the pain as a 10.  He called his gastroenterologist yesterday and was told to come to the ER if symptoms worsened. ? ?Home Medications ?Prior to Admission medications   ?Medication Sig Start Date End Date Taking? Authorizing Provider  ?abiraterone acetate (ZYTIGA) 250 MG tablet TAKE 4 TABLETS BY MOUTH ONCE DAILY AS DIRECTED.  TAKE 1 HOUR BEFORE OR 2 HOURS AFTER A MEAL ?Patient not taking: Reported on 05/30/2021 02/19/21   [name], MD  ?aspirin EC 81 MG tablet Take 81 mg by mouth daily. Swallow whole.    [provider]  ?atorvastatin (LIPITOR) 10 MG tablet TAKE 1 TABLET BY MOUTH EVERY DAY 02/07/21   [name], MD  ?BIOTIN 5000 PO Take 1 tablet by mouth daily. 02/02/15   [provider]  ?CALCIUM-VITAMIN D PO Take 1 tablet by mouth daily.    [provider]  ?Denosumab (XGEVA Bishop) Inject into the skin.    [provider]  ?enzalutamide Gillermina Phy) 40 MG capsule Take 4 capsules (160 mg total) by mouth daily. 06/04/21   [name], MD  ?ferrous sulfate 325 (65 FE) MG EC tablet Take 1 tablet (325 mg total) by mouth daily with breakfast. 05/27/21   [name], MD  ?fexofenadine (ALLEGRA) 180 MG tablet Take 180 mg by mouth daily.    [provider]  ?hydrOXYzine (ATARAX/VISTARIL) 10 MG tablet Take 10 mg by mouth in the morning and at bedtime. 03/31/18   [provider]   ?KLOR-CON M20 20 MEQ tablet TAKE 1 TABLET BY MOUTH TWICE A DAY 04/11/21   [name], MD  ?lidocaine-prilocaine (EMLA) cream Apply 1 application topically as needed. ?Patient taking differently: Apply 1 application topically as needed (access port). 12/27/18   [name], MD  ?LORazepam (ATIVAN) 1 MG tablet TAKE 1 TABLET BY MOUTH EVERY 8 HOURS AS NEEDED FOR ANXIETY ?Patient not taking: Reported on 04/17/2021 02/27/21   [name], MD  ?Multiple Vitamin (MULTIVITAMIN) tablet Take 1 tablet by mouth daily.    [provider]  ?olmesartan (BENICAR) 40 MG tablet Take 1 tablet (40 mg total) by mouth daily. 03/27/21   [name], MD  ?omeprazole (PRILOSEC) 20 MG capsule Take 1 capsule (20 mg total) by mouth daily. Take omeprazole 20 mg everyday for 30 days, then use as needed for reflux. 04/17/21   Armbruster, Carlota Raspberry, MD  ?polyethylene glycol (MIRALAX / GLYCOLAX) 17 g packet Take 17 g by mouth daily. 03/09/21 06/07/21  [place] Donnamarie Poag, DO  ?azelastine (ASTELIN) 0.1 % nasal spray PLACE 1 SPRAY INTO BOTH NOSTRILS 2 (TWO) TIMES DAILY. USE IN [facility] NOSTRIL AS DIRECTED 09/26/18 07/30/19  [name], MD  ?   ? ?Allergies    ?Prednisone   ? ?Review of Systems   ?Review of Systems  ?  Constitutional:  Negative for fever.  ?Gastrointestinal:  Positive for abdominal distention, abdominal pain, constipation, nausea and vomiting.  ? ?Physical Exam ?Updated Vital Signs ?BP (!) 167/86   Pulse 69   Temp 97.7 ?F (36.5 ?C) (Temporal)   Resp 13   Ht '6\' 4"'$  (1.93 m)   Wt 86.6 kg   SpO2 100%   BMI 23.25 kg/m?  ?Physical Exam ?Vitals and nursing note reviewed.  ?Constitutional:   ?   Appearance: He is well-developed. He is not ill-appearing or diaphoretic.  ?HENT:  ?   Head: Normocephalic and atraumatic.  ?Cardiovascular:  ?   Rate and Rhythm: Normal rate and regular rhythm.  ?   Heart sounds: Normal heart sounds.  ?Pulmonary:  ?   Effort: Pulmonary effort is normal.  ?   Breath sounds: Normal breath sounds.   ?Abdominal:  ?   Palpations: Abdomen is soft.  ?   Tenderness: There is generalized abdominal tenderness.  ?Skin: ?   General: Skin is warm and dry.  ?Neurological:  ?   Mental Status: He is alert.  ? ? ?ED Results / Procedures / Treatments   ?Labs ?(all labs ordered are listed, but only abnormal results are displayed) ?Labs Reviewed  ?CBC - Abnormal; Notable for the following components:  ?    Result Value  ? WBC 11.5 (*)   ? All other components within normal limits  ?URINALYSIS, ROUTINE W REFLEX MICROSCOPIC - Abnormal; Notable for the following components:  ? Hgb urine dipstick SMALL (*)   ? Protein, ur 30 (*)   ? Bacteria, UA RARE (*)   ? All other components within normal limits  ?COMPREHENSIVE METABOLIC PANEL - Abnormal; Notable for the following components:  ? Sodium 133 (*)   ? CO2 21 (*)   ? Glucose, Bld 119 (*)   ? Calcium 8.7 (*)   ? All other components within normal limits  ?LIPASE, BLOOD  ? ? ?EKG ?None ? ?Radiology ?DG ABD ACUTE 2+V W 1V CHEST ? ?Result Date: 06/07/2021 ?CLINICAL DATA:  Abdominal pain and nausea, history of volvulus EXAM: DG ABDOMEN ACUTE WITH 1 VIEW CHEST COMPARISON:  KUB and CT abdomen/pelvis 03/07/2021 FINDINGS: Chest: A right chest wall port is in place. The cardiomediastinal silhouette is normal. There is no focal consolidation or pulmonary edema. There is no pleural effusion or pneumothorax Abdomen: There is a loop of large bowel in the mid abdomen which is dilated measuring up to 11.8 cm with a layering fluid level. There is no evidence of free intraperitoneal air. There is no abnormal soft tissue calcification. There is no acute osseous abnormality. There is levoscoliosis of the lumbar spine. IMPRESSION: Findings above concerning for recurrent sigmoid volvulus. Recommend surgical consultation. These results were called by telephone at the time of interpretation on 06/07/2021 at 11:06 am to provider Sherwood Gambler , who verbally acknowledged these results. Electronically Signed    By: Valetta Mole M.D.   On: 06/07/2021 11:03   ? ?Procedures ?Marland KitchenCritical Care ?Performed by: Sherwood Gambler, MD ?Authorized by: Sherwood Gambler, MD  ? ?Critical care provider statement:  ?  Critical care time (minutes):  30 ?  Critical care time was exclusive of:  Separately billable procedures and treating other patients ?  Critical care was necessary to treat or prevent imminent or life-threatening deterioration of the following conditions:  Shock and circulatory failure ?  Critical care was time spent personally by me on the following activities:  Development of treatment plan with patient  or surrogate, discussions with consultants, evaluation of patient's response to treatment, examination of patient, ordering and review of laboratory studies, ordering and review of radiographic studies, ordering and performing treatments and interventions, pulse oximetry, re-evaluation of patient's condition and review of old charts  ? ? ?Medications Ordered in ED ?Medications  ?lactated ringers infusion ( Intravenous Restarted 06/07/21 1222)  ?fentaNYL (SUBLIMAZE) injection 50 mcg (50 mcg Intravenous Given 06/07/21 1019)  ?lactated ringers bolus 1,000 mL (0 mLs Intravenous Stopped 06/07/21 1128)  ?ondansetron (ZOFRAN) injection 4 mg (4 mg Intravenous Given 06/07/21 1030)  ?fentaNYL (SUBLIMAZE) injection 50 mcg (50 mcg Intravenous Given 06/07/21 1132)  ? ? ?ED Course/ Medical Decision Making/ A&P ?  ?                        ?Medical Decision Making ?Amount and/or Complexity of Data Reviewed ?Independent Historian: spouse ?External Data Reviewed: notes. ?Labs: ordered. ?Radiology: ordered and independent interpretation performed. ? ?Risk ?Prescription drug management. ?Decision regarding hospitalization. ? ? ?Patient presents with recurrent sigmoid volvulus.  He is not in distress and IV fentanyl and Zofran have improved his symptoms.  Abdominal x-ray was obtained and I personally reviewed/interpreted these images and he has a recurrent  sigmoid volvulus.  Labs show mild leukocytosis that is likely reactive.  Otherwise electrolytes are okay compared to baseline without emergent findings.  Discussed with gastroenterology, Dr. Benson Norway, who has taken to

## 2021-06-07 NOTE — Consult Note (Signed)
Reason for Consult: Sigmoid volvulus ?Referring Physician: [facility] ? ?Pilar Plate Breidenbach ?HPI: This is a 76 year old male with a PMH of sigmoid volvulus s/p decompression on 03/07/2021.  He followed up with Dr. Havery Moros with a full colonoscopy on 04/17/2021.  This past Wednesday he had a good bowel movement, but then on Thursday he felt unwell.  He took Miralax to help with his abdominal discomfort, but it was not beneficial. As the day progressed his symptoms worsened and he called Dr. Havery Moros, but he was not able to be seen.  Today he woke up with pain and it was severe.  As a result he presented to [facility] and he was diagnosed with a recurrent sigmoid volvulus.  He states that he was planning on having surgical resection. ? ?Past Medical History:  ?Diagnosis Date  ? Cancer [facility])   ? PROSTATE  ? Edema leg Sept 28, 2016  ? left leg from foot to thigh, increasinly worse over last 4 weeks  ? Hypertension   ? Hypoglycemia   ? Swelling LAST 30 DAYS DR [facility] AWARE  ? LEFT LEG AND FOOT  ? ? ?Past Surgical History:  ?Procedure Laterality Date  ? BOWEL DECOMPRESSION N/A 03/07/2021  ? Procedure: BOWEL DECOMPRESSION;  Surgeon: Yetta Flock, MD;  Location: [facility];  Service: Gastroenterology;  Laterality: N/A;  ? FLEXIBLE SIGMOIDOSCOPY N/A 03/07/2021  ? Procedure: FLEXIBLE SIGMOIDOSCOPY;  Surgeon: Yetta Flock, MD;  Location: [facility];  Service: Gastroenterology;  Laterality: N/A;  ? GUM SURGERY    ? TEETH IMPLANTS ALSO  ? ORCHIECTOMY Bilateral 01/03/2015  ? Procedure: ORCHIECTOMY;  Surgeon: Alexis Frock, MD;  Location: [facility];  Service: Urology;  Laterality: Bilateral;  ? ? ?Family History  ?Problem Relation Age of Onset  ? CVA Mother   ? CAD Father   ? Osteoarthritis Brother   ? Colon cancer Neg Hx   ? ? ?Social History:  reports that he has never smoked. He has never used smokeless tobacco. He reports current alcohol use of about 1.0 standard drink per week. He reports that he does not use  drugs. ? ?Allergies:  ?Allergies  ?Allergen Reactions  ? Prednisone Anxiety  ? ? ?Medications: Scheduled: ?Continuous: ? lactated ringers 1,000 mL (06/07/21 1152)  ? ? ?Results for orders placed or performed during the hospital encounter of 06/07/21 (from the past 24 hour(s))  ?CBC     Status: Abnormal  ? Collection Time: 06/07/21  9:34 AM  ?Result Value Ref Range  ? WBC 11.5 (H) 4.0 - 10.5 K/uL  ? RBC 4.62 4.22 - 5.81 MIL/uL  ? Hemoglobin 14.4 13.0 - 17.0 g/dL  ? HCT 42.2 39.0 - 52.0 %  ? MCV 91.3 80.0 - 100.0 fL  ? MCH 31.2 26.0 - 34.0 pg  ? MCHC 34.1 30.0 - 36.0 g/dL  ? RDW 13.7 11.5 - 15.5 %  ? Platelets 270 150 - 400 K/uL  ? nRBC 0.0 0.0 - 0.2 %  ?Urinalysis, Routine w reflex microscopic Urine, Clean Catch     Status: Abnormal  ? Collection Time: 06/07/21  9:36 AM  ?Result Value Ref Range  ? Color, Urine YELLOW YELLOW  ? APPearance CLEAR CLEAR  ? Specific Gravity, Urine 1.023 1.005 - 1.030  ? pH 6.0 5.0 - 8.0  ? Glucose, UA NEGATIVE NEGATIVE mg/dL  ? Hgb urine dipstick SMALL (A) NEGATIVE  ? Bilirubin Urine NEGATIVE NEGATIVE  ? Ketones, ur NEGATIVE NEGATIVE mg/dL  ? Protein, ur 30 (A) NEGATIVE  mg/dL  ? Nitrite NEGATIVE NEGATIVE  ? Leukocytes,Ua NEGATIVE NEGATIVE  ? RBC / HPF 11-20 0 - 5 RBC/hpf  ? WBC, UA 0-5 0 - 5 WBC/hpf  ? Bacteria, UA RARE (A) NONE SEEN  ? Squamous Epithelial / LPF 0-5 0 - 5  ? Mucus PRESENT   ?Comprehensive metabolic panel     Status: Abnormal  ? Collection Time: 06/07/21 10:08 AM  ?Result Value Ref Range  ? Sodium 133 (L) 135 - 145 mmol/L  ? Potassium 4.0 3.5 - 5.1 mmol/L  ? Chloride 103 98 - 111 mmol/L  ? CO2 21 (L) 22 - 32 mmol/L  ? Glucose, Bld 119 (H) 70 - 99 mg/dL  ? BUN 21 8 - 23 mg/dL  ? Creatinine, Ser 0.75 0.61 - 1.24 mg/dL  ? Calcium 8.7 (L) 8.9 - 10.3 mg/dL  ? Total Protein 7.1 6.5 - 8.1 g/dL  ? Albumin 4.0 3.5 - 5.0 g/dL  ? AST 19 15 - 41 U/L  ? ALT 15 0 - 44 U/L  ? Alkaline Phosphatase 40 38 - 126 U/L  ? Total Bilirubin 0.7 0.3 - 1.2 mg/dL  ? GFR, Estimated >60 >60 mL/min  ?  Anion gap 9 5 - 15  ?Lipase, blood     Status: None  ? Collection Time: 06/07/21 10:08 AM  ?Result Value Ref Range  ? Lipase 34 11 - 51 U/L  ?  ? ?DG ABD ACUTE 2+V W 1V CHEST ? ?Result Date: 06/07/2021 ?CLINICAL DATA:  Abdominal pain and nausea, history of volvulus EXAM: DG ABDOMEN ACUTE WITH 1 VIEW CHEST COMPARISON:  KUB and CT abdomen/pelvis 03/07/2021 FINDINGS: Chest: A right chest wall port is in place. The cardiomediastinal silhouette is normal. There is no focal consolidation or pulmonary edema. There is no pleural effusion or pneumothorax Abdomen: There is a loop of large bowel in the mid abdomen which is dilated measuring up to 11.8 cm with a layering fluid level. There is no evidence of free intraperitoneal air. There is no abnormal soft tissue calcification. There is no acute osseous abnormality. There is levoscoliosis of the lumbar spine. IMPRESSION: Findings above concerning for recurrent sigmoid volvulus. Recommend surgical consultation. These results were called by telephone at the time of interpretation on 06/07/2021 at 11:06 am to provider Sherwood Gambler , who verbally acknowledged these results. Electronically Signed   By: Valetta Mole M.D.   On: 06/07/2021 11:03   ? ?ROS:  As stated above in the HPI otherwise negative. ? ?Blood pressure (!) 180/95, pulse 77, temperature 97.7 ?F (36.5 ?C), temperature source Temporal, resp. rate 16, height '6\' 4"'$  (1.93 m), weight 86.6 kg, SpO2 100 %.   ? ?PE: ?Gen: NAD, Alert and Oriented ?HEENT:  [place]/AT, EOMI ?Neck: Supple, no LAD ?Lungs: CTA Bilaterally ?CV: RRR without M/G/R ?ABD: Moderately firm, distended, tympanic ?Ext: No C/C/E ? ?Assessment/Plan: ?1) Recurrent sigmoid volvulus - FFS for decompression. ? ?Trejan Buda D ?06/07/2021, 12:09 PM  ? ? ?  ?

## 2021-06-08 DIAGNOSIS — K562 Volvulus: Secondary | ICD-10-CM | POA: Diagnosis not present

## 2021-06-08 LAB — CBC
HCT: 37.7 % — ABNORMAL LOW (ref 39.0–52.0)
Hemoglobin: 12.8 g/dL — ABNORMAL LOW (ref 13.0–17.0)
MCH: 31.1 pg (ref 26.0–34.0)
MCHC: 34 g/dL (ref 30.0–36.0)
MCV: 91.7 fL (ref 80.0–100.0)
Platelets: 238 10*3/uL (ref 150–400)
RBC: 4.11 MIL/uL — ABNORMAL LOW (ref 4.22–5.81)
RDW: 13.5 % (ref 11.5–15.5)
WBC: 6.9 10*3/uL (ref 4.0–10.5)
nRBC: 0 % (ref 0.0–0.2)

## 2021-06-08 LAB — BASIC METABOLIC PANEL
Anion gap: 5 (ref 5–15)
BUN: 12 mg/dL (ref 8–23)
CO2: 24 mmol/L (ref 22–32)
Calcium: 8.3 mg/dL — ABNORMAL LOW (ref 8.9–10.3)
Chloride: 104 mmol/L (ref 98–111)
Creatinine, Ser: 0.71 mg/dL (ref 0.61–1.24)
GFR, Estimated: >60 mL/min (ref >60)
Glucose, Bld: 96 mg/dL (ref 70–99)
Potassium: 3.8 mmol/L (ref 3.5–5.1)
Sodium: 133 mmol/L — ABNORMAL LOW (ref 135–145)

## 2021-06-08 NOTE — Progress Notes (Signed)
Patient ID: Philip [name], male   DOB: 1945/10/20, 76 y.o.   MRN: [id number] ? ? ?Denies abdominal pain this morning ?Tolerating clears ?Abdomen soft, non-tender, non-distended ? ?A/P: ?Recurrent sigmoid volvulus ? ?Will keep on liquids.  Slow bowel prep tomorrow with potential partial colectomy by Dr. Hassell Done Monday who is our acute care surgeon this week. ?

## 2021-06-08 NOTE — Progress Notes (Signed)
?PROGRESS NOTE ? ? ? ?Philip [name]  VVO:[id number] DOB: [birth year] DOA: 06/07/2021 ?PCP: [name], MD  ?Outpatient Specialists:  ? ? ? ?Brief Narrative:  ?Patient is a 76 year old Caucasian male with past medical history significant for prostate cancer, hypertension, hyperglycemia, aortic atherosclerosis, chronic idiopathic constipation, GERD, hyperlipidemia, drug-induced hypokalemia, iron deficiency anemia and history of volvulus in January this year requiring flexible sigmoidoscopy decompression by Dr. Havery Moros.  Patient declined colectomy in January.  Patient presents with constipation and abdominal pain.  Surgery is planned for Monday tentatively.  Input from the GI and surgical team is appreciated.   ? ?Patient seen alongside patient's nurse today.  No new complaints. ?Assessment & Plan: ?  ?Principal Problem: ?  Sigmoid volvulus ([place]) ?Active Problems: ?  Prostate cancer ([place]) ?  Atherosclerosis of aorta ([place]) ?  Seasonal allergic rhinitis due to pollen ?  Gastroesophageal reflux disease without esophagitis ?  Hyperlipidemia LDL goal <100 ?  Essential hypertension ?  Iron deficiency anemia ? ? ?Sigmoid volvulus ([place]) ?S/P flexible sigmoidoscopy decompression. ?Admit to telemetry/inpatient. ?Clear liquid diet. ?May advance to full liquid diet. ?Do not advance beyond full liquid diet. ?Analgesics as needed. ?Antiemetics as needed. ?Continue gentle IV fluids. ?Gastroenterology consult/procedure appreciated. ?General surgery consult appreciated. ?06/08/2021: Colectomy is planned for Monday, 06/10/2021. ?  ? ? Prostate cancer ([place]) ?Continue enzalutamide 160 mg p.o. daily. ?  ?  Atherosclerosis of aorta ([place]) ?  Hyperlipidemia LDL goal <100 ?Continue atorvastatin 10 mg p.o. daily. ?  ?  Seasonal allergic rhinitis due to pollen ?Continue Allegra or formulary equivalent. ?  ?  Gastroesophageal reflux disease without esophagitis ?Continue PPI. ?  ?  Essential hypertension ?Continue Benicar or formulary  equivalent. ?Monitor BP, renal function and electrolytes. ?  ?  Iron deficiency anemia (history of) ?Not anemic at the moment. ?Hold iron supplementation to avoid constipation. ? ? ?DVT prophylaxis: Lovenox ?Code Status: Full code ?Family Communication:  ?Disposition Plan: Home eventually ? ? ?Consultants:  ?Surgery ?GI ? ?Procedures:  ?Colectomy is planned on Monday, 06/10/2021. ? ?Antimicrobials:  ?None ? ? ?Subjective: ?No new complaints. ?No abdominal pain. ?No constipation ? ?Objective: ?Vitals:  ? 06/07/21 2221 06/08/21 0217 06/08/21 0625 06/08/21 1309  ?BP: (!) 154/88 (!) 161/85 (!) 173/90 (!) 176/94  ?Pulse: 69 64 66 78  ?Resp: 20 18 (!) 22 18  ?Temp: 97.9 ?F (36.6 ?C) 98.1 ?F (36.7 ?C) 97.7 ?F (36.5 ?C) 98.3 ?F (36.8 ?C)  ?TempSrc: Oral Oral Oral Oral  ?SpO2: 100% 100% 100% 99%  ?Weight:      ?Height:      ? ? ?Intake/Output Summary (Last 24 hours) at 06/08/2021 1816 ?Last data filed at 06/08/2021 1548 ?Gross per 24 hour  ?Intake 840 ml  ?Output --  ?Net 840 ml  ? ?Filed Weights  ? 06/07/21 1014 06/07/21 1142 06/07/21 1813  ?Weight: 87 kg 86.6 kg 84.4 kg  ? ? ?Examination: ? ?General exam: Appears calm and comfortable ?HEENT: Mild pallor.  No jaundice. ?Neck: Supple.  No raised JVD. ?Respiratory system: Clear to auscultation.  ?Cardiovascular system: S1 & S2 heard ?Gastrointestinal system: Abdomen is soft and nontender.   ?Central nervous system: Alert and oriented.  Patient moves all extremities.   ?Extremities: No leg edema. ? ?Data Reviewed: I have personally reviewed following labs and imaging studies ? ?CBC: ?Recent Labs  ?Lab 06/07/21 ?0934 06/08/21 ?2694  ?WBC 11.5* 6.9  ?HGB 14.4 12.8*  ?HCT 42.2 37.7*  ?MCV 91.3 91.7  ?PLT 270 238  ? ?  Basic Metabolic Panel: ?Recent Labs  ?Lab 06/07/21 ?1008 06/08/21 ?2952  ?NA 133* 133*  ?K 4.0 3.8  ?CL 103 104  ?CO2 21* 24  ?GLUCOSE 119* 96  ?BUN 21 12  ?CREATININE 0.75 0.71  ?CALCIUM 8.7* 8.3*  ? ?GFR: ?Estimated Creatinine Clearance: 93.8 mL/min (by C-G formula based  on SCr of 0.71 mg/dL). ?Liver Function Tests: ?Recent Labs  ?Lab 06/07/21 ?1008  ?AST 19  ?ALT 15  ?ALKPHOS 40  ?BILITOT 0.7  ?PROT 7.1  ?ALBUMIN 4.0  ? ?Recent Labs  ?Lab 06/07/21 ?1008  ?LIPASE 34  ? ?No results for input(s): AMMONIA in the last 168 hours. ?Coagulation Profile: ?No results for input(s): INR, PROTIME in the last 168 hours. ?Cardiac Enzymes: ?No results for input(s): CKTOTAL, CKMB, CKMBINDEX, TROPONINI in the last 168 hours. ?BNP (last 3 results) ?No results for input(s): PROBNP in the last 8760 hours. ?HbA1C: ?No results for input(s): HGBA1C in the last 72 hours. ?CBG: ?No results for input(s): GLUCAP in the last 168 hours. ?Lipid Profile: ?No results for input(s): CHOL, HDL, LDLCALC, TRIG, CHOLHDL, LDLDIRECT in the last 72 hours. ?Thyroid Function Tests: ?No results for input(s): TSH, T4TOTAL, FREET4, T3FREE, THYROIDAB in the last 72 hours. ?Anemia Panel: ?No results for input(s): VITAMINB12, FOLATE, FERRITIN, TIBC, IRON, RETICCTPCT in the last 72 hours. ?Urine analysis: ?   ?Component Value Date/Time  ? COLORURINE YELLOW 06/07/2021 0936  ? APPEARANCEUR CLEAR 06/07/2021 0936  ? LABSPEC 1.023 06/07/2021 0936  ? PHURINE 6.0 06/07/2021 0936  ? GLUCOSEU NEGATIVE 06/07/2021 0936  ? HGBUR SMALL (A) 06/07/2021 0936  ? [place] NEGATIVE 06/07/2021 0936  ? [place] NEGATIVE 06/07/2021 0936  ? PROTEINUR 30 (A) 06/07/2021 0936  ? NITRITE NEGATIVE 06/07/2021 0936  ? LEUKOCYTESUR NEGATIVE 06/07/2021 0936  ? ?Sepsis Labs: ?'@LABRCNTIP'$ (procalcitonin:4,lacticidven:4) ? ?)No results found for this or any previous visit (from the past 240 hour(s)).  ? ? ? ? ? ?Radiology Studies: ?DG ABD ACUTE 2+V W 1V CHEST ? ?Result Date: 06/07/2021 ?CLINICAL DATA:  Abdominal pain and nausea, history of volvulus EXAM: DG ABDOMEN ACUTE WITH 1 VIEW CHEST COMPARISON:  KUB and CT abdomen/pelvis 03/07/2021 FINDINGS: Chest: A right chest wall port is in place. The cardiomediastinal silhouette is normal. There is no focal consolidation  or pulmonary edema. There is no pleural effusion or pneumothorax Abdomen: There is a loop of large bowel in the mid abdomen which is dilated measuring up to 11.8 cm with a layering fluid level. There is no evidence of free intraperitoneal air. There is no abnormal soft tissue calcification. There is no acute osseous abnormality. There is levoscoliosis of the lumbar spine. IMPRESSION: Findings above concerning for recurrent sigmoid volvulus. Recommend surgical consultation. These results were called by telephone at the time of interpretation on 06/07/2021 at 11:06 am to provider Sherwood Gambler , who verbally acknowledged these results. Electronically Signed   By: Valetta Mole M.D.   On: 06/07/2021 11:03   ? ? ? ? ? ?Scheduled Meds: ? atorvastatin  10 mg Oral Daily  ? Chlorhexidine Gluconate Cloth  6 each Topical Daily  ? enoxaparin (LOVENOX) injection  40 mg Subcutaneous Q24H  ? enzalutamide  160 mg Oral Daily  ? hydrOXYzine  10 mg Oral Daily  ? irbesartan  150 mg Oral Daily  ? loratadine  10 mg Oral Daily  ? pantoprazole  40 mg Oral Daily  ? potassium chloride SA  20 mEq Oral BID  ? sodium chloride flush  10-40 mL Intracatheter Q12H  ? ?Continuous  Infusions: ? lactated ringers 88 mL/hr at 06/08/21 0654  ? ? ? LOS: 1 day  ? ? ?Time spent: 35 minutes. ? ? ? ?Dana Allan, MD  ?[facility] ?Pager #: [phone] ?7PM-7AM contact night coverage as above ? ? ?  ?

## 2021-06-08 NOTE — Progress Notes (Addendum)
Pts rectal tube fell out.  ?RN attempted to notify Dr Benson Norway via secure message at 2148, no response.  ?RN paged on call GI MD via paging system twice at [phone], [phone], no response.  ?RN notified [place] NP of rectal tube displacement & unsuccessful attempts at paging GI. ?VSS, bowel sounds active, abdomen soft, nondistended, pt denies pain. Will continue to monitor. ?

## 2021-06-09 DIAGNOSIS — K562 Volvulus: Secondary | ICD-10-CM | POA: Diagnosis not present

## 2021-06-09 LAB — MRSA NEXT GEN BY PCR, NASAL: MRSA by PCR Next Gen: NOT DETECTED

## 2021-06-09 MED ORDER — POLYETHYLENE GLYCOL 3350 17 GM/SCOOP PO POWD
0.5000 | Freq: Once | ORAL | Status: AC
  Administered 2021-06-09: 127.5 g via ORAL
  Filled 2021-06-09: qty 255

## 2021-06-09 MED ORDER — SODIUM CHLORIDE 0.9 % IV SOLN
2.0000 g | INTRAVENOUS | Status: AC
  Administered 2021-06-10: 2 g via INTRAVENOUS
  Filled 2021-06-09: qty 2

## 2021-06-09 MED ORDER — ENSURE PRE-SURGERY PO LIQD
296.0000 mL | Freq: Once | ORAL | Status: AC
Start: 2021-06-10 — End: 2021-06-10
  Administered 2021-06-10: 296 mL via ORAL
  Filled 2021-06-09: qty 296

## 2021-06-09 MED ORDER — LACTATED RINGERS IV SOLN: INTRAVENOUS | Status: DC

## 2021-06-09 NOTE — H&P (View-Only) (Signed)
2 Days Post-Op  ? ?Subjective/Chief Complaint: ?No complaints ? ? ?Objective: ?Vital signs in last 24 hours: ?Temp:  [97.9 ?F (36.6 ?C)-98.3 ?F (36.8 ?C)] 98 ?F (36.7 ?C) (04/09 0342) ?Pulse Rate:  [71-78] 72 (04/09 0342) ?Resp:  [16-18] 16 (04/09 0342) ?BP: (150-176)/(82-95) 154/82 (04/09 0342) ?SpO2:  [99 %-100 %] 100 % (04/09 0342) ?Last BM Date : 06/07/21 ? ?Intake/Output from previous day: ?04/08 0701 - 04/09 0700 ?In: 2376 [P.O.:1320; I.V.:1056] ?Out: -  ?Intake/Output this shift: ?No intake/output data recorded. ? ?Exam: ?Abdomen remains soft, NT,ND ? ?Lab Results:  ?Recent Labs  ?  06/07/21 ?6629 06/08/21 ?4765  ?WBC 11.5* 6.9  ?HGB 14.4 12.8*  ?HCT 42.2 37.7*  ?PLT 270 238  ? ?BMET ?Recent Labs  ?  06/07/21 ?1008 06/08/21 ?4650  ?NA 133* 133*  ?K 4.0 3.8  ?CL 103 104  ?CO2 21* 24  ?GLUCOSE 119* 96  ?BUN 21 12  ?CREATININE 0.75 0.71  ?CALCIUM 8.7* 8.3*  ? ?PT/INR ?No results for input(s): LABPROT, INR in the last 72 hours. ?ABG ?No results for input(s): PHART, HCO3 in the last 72 hours. ? ?Invalid input(s): PCO2, PO2 ? ?Studies/Results: ?DG ABD ACUTE 2+V W 1V CHEST ? ?Result Date: 06/07/2021 ?CLINICAL DATA:  Abdominal pain and nausea, history of volvulus EXAM: DG ABDOMEN ACUTE WITH 1 VIEW CHEST COMPARISON:  KUB and CT abdomen/pelvis 03/07/2021 FINDINGS: Chest: A right chest wall port is in place. The cardiomediastinal silhouette is normal. There is no focal consolidation or pulmonary edema. There is no pleural effusion or pneumothorax Abdomen: There is a loop of large bowel in the mid abdomen which is dilated measuring up to 11.8 cm with a layering fluid level. There is no evidence of free intraperitoneal air. There is no abnormal soft tissue calcification. There is no acute osseous abnormality. There is levoscoliosis of the lumbar spine. IMPRESSION: Findings above concerning for recurrent sigmoid volvulus. Recommend surgical consultation. These results were called by telephone at the time of interpretation on  06/07/2021 at 11:06 am to provider Sherwood Gambler , who verbally acknowledged these results. Electronically Signed   By: Valetta Mole M.D.   On: 06/07/2021 11:03   ? ?Anti-infectives: ?Anti-infectives (From admission, onward)  ? ? None  ? ?  ? ? ?Assessment/Plan: ?Recurrent Sigmoid Volvulus ? ?Gentle bowel prep today ?Likely Sigmoid resection tomorrow by Dr. Hassell Done, our acute care surgeon this week at his discretion. ? ? ?Coralie Keens ?06/09/2021 ? ?

## 2021-06-09 NOTE — Progress Notes (Signed)
?PROGRESS NOTE ? ? ? ?Philip [name]  YBW:[id number] DOB: 1945/12/21 DOA: 06/07/2021 ?PCP: [name], MD  ?Outpatient Specialists:  ? ? ? ?Brief Narrative:  ?Patient is a 76 year old Caucasian male with past medical history significant for prostate cancer, hypertension, hyperglycemia, aortic atherosclerosis, chronic idiopathic constipation, GERD, hyperlipidemia, drug-induced hypokalemia, iron deficiency anemia and history of volvulus in January this year requiring flexible sigmoidoscopy decompression by Dr. Havery Moros.  Patient declined colectomy in January.  Patient presents with constipation and abdominal pain.  Surgery is planned for Monday tentatively.  Input from the GI and surgical team is appreciated.   ? ?Patient seen alongside patient's nurse today.  No new complaints. ? ?06/09/2021: Patient seen alongside patient's wife.  No new complaints.  For surgery tomorrow. ? ?Assessment & Plan: ?  ?Principal Problem: ?  Sigmoid volvulus ([place]) ?Active Problems: ?  Prostate cancer ([place]) ?  Atherosclerosis of aorta ([place]) ?  Seasonal allergic rhinitis due to pollen ?  Gastroesophageal reflux disease without esophagitis ?  Hyperlipidemia LDL goal <100 ?  Essential hypertension ?  Iron deficiency anemia ? ? ?Sigmoid volvulus ([place]) ?S/P flexible sigmoidoscopy decompression. ?Admit to telemetry/inpatient. ?Clear liquid diet. ?May advance to full liquid diet. ?Do not advance beyond full liquid diet. ?Analgesics as needed. ?Antiemetics as needed. ?Continue gentle IV fluids. ?Gastroenterology consult/procedure appreciated. ?General surgery consult appreciated. ?06/09/2021: Colectomy is planned for Monday, 06/10/2021. ?  ? ? Prostate cancer ([place]) ?Continue enzalutamide 160 mg p.o. daily. ?  ?  Atherosclerosis of aorta ([place]) ?  Hyperlipidemia LDL goal <100 ?Continue atorvastatin 10 mg p.o. daily. ?  ?  Seasonal allergic rhinitis due to pollen ?Continue Allegra or formulary equivalent. ?  ?  Gastroesophageal reflux disease without  esophagitis ?Continue PPI. ?  ?  Essential hypertension ?Continue Benicar or formulary equivalent. ?Monitor BP, renal function and electrolytes. ?  ?  Iron deficiency anemia (history of) ?Not anemic at the moment. ?Hold iron supplementation to avoid constipation. ? ? ?DVT prophylaxis: Lovenox ?Code Status: Full code ?Family Communication:  ?Disposition Plan: Home eventually ? ? ?Consultants:  ?Surgery ?GI ? ?Procedures:  ?Colectomy is planned on Monday, 06/10/2021. ? ?Antimicrobials:  ?None ? ? ?Subjective: ?No new complaints. ?No abdominal pain. ?No constipation ? ?Objective: ?Vitals:  ? 06/08/21 0625 06/08/21 1309 06/08/21 2058 06/09/21 0342  ?BP: (!) 173/90 (!) 176/94 (!) 150/95 (!) 154/82  ?Pulse: 66 78 71 72  ?Resp: (!) '22 18 16 16  '$ ?Temp: 97.7 ?F (36.5 ?C) 98.3 ?F (36.8 ?C) 97.9 ?F (36.6 ?C) 98 ?F (36.7 ?C)  ?TempSrc: Oral Oral Oral   ?SpO2: 100% 99% 100% 100%  ?Weight:      ?Height:      ? ? ?Intake/Output Summary (Last 24 hours) at 06/09/2021 1307 ?Last data filed at 06/08/2021 2315 ?Gross per 24 hour  ?Intake 1776 ml  ?Output --  ?Net 1776 ml  ? ? ?Filed Weights  ? 06/07/21 1014 06/07/21 1142 06/07/21 1813  ?Weight: 87 kg 86.6 kg 84.4 kg  ? ? ?Examination: ? ?General exam: Appears calm and comfortable ?HEENT: Mild pallor.  No jaundice. ?Neck: Supple.  No raised JVD. ?Respiratory system: Clear to auscultation.  ?Cardiovascular system: S1 & S2 heard ?Gastrointestinal system: Abdomen is soft and nontender.   ?Central nervous system: Alert and oriented.  Patient moves all extremities.   ?Extremities: No leg edema. ? ?Data Reviewed: I have personally reviewed following labs and imaging studies ? ?CBC: ?Recent Labs  ?Lab 06/07/21 ?0934 06/08/21 ?7681  ?WBC 11.5* 6.9  ?  HGB 14.4 12.8*  ?HCT 42.2 37.7*  ?MCV 91.3 91.7  ?PLT 270 238  ? ? ?Basic Metabolic Panel: ?Recent Labs  ?Lab 06/07/21 ?1008 06/08/21 ?1655  ?NA 133* 133*  ?K 4.0 3.8  ?CL 103 104  ?CO2 21* 24  ?GLUCOSE 119* 96  ?BUN 21 12  ?CREATININE 0.75 0.71   ?CALCIUM 8.7* 8.3*  ? ? ?GFR: ?Estimated Creatinine Clearance: 93.8 mL/min (by C-G formula based on SCr of 0.71 mg/dL). ?Liver Function Tests: ?Recent Labs  ?Lab 06/07/21 ?1008  ?AST 19  ?ALT 15  ?ALKPHOS 40  ?BILITOT 0.7  ?PROT 7.1  ?ALBUMIN 4.0  ? ? ?Recent Labs  ?Lab 06/07/21 ?1008  ?LIPASE 34  ? ? ?No results for input(s): AMMONIA in the last 168 hours. ?Coagulation Profile: ?No results for input(s): INR, PROTIME in the last 168 hours. ?Cardiac Enzymes: ?No results for input(s): CKTOTAL, CKMB, CKMBINDEX, TROPONINI in the last 168 hours. ?BNP (last 3 results) ?No results for input(s): PROBNP in the last 8760 hours. ?HbA1C: ?No results for input(s): HGBA1C in the last 72 hours. ?CBG: ?No results for input(s): GLUCAP in the last 168 hours. ?Lipid Profile: ?No results for input(s): CHOL, HDL, LDLCALC, TRIG, CHOLHDL, LDLDIRECT in the last 72 hours. ?Thyroid Function Tests: ?No results for input(s): TSH, T4TOTAL, FREET4, T3FREE, THYROIDAB in the last 72 hours. ?Anemia Panel: ?No results for input(s): VITAMINB12, FOLATE, FERRITIN, TIBC, IRON, RETICCTPCT in the last 72 hours. ?Urine analysis: ?   ?Component Value Date/Time  ? COLORURINE YELLOW 06/07/2021 0936  ? APPEARANCEUR CLEAR 06/07/2021 0936  ? LABSPEC 1.023 06/07/2021 0936  ? PHURINE 6.0 06/07/2021 0936  ? GLUCOSEU NEGATIVE 06/07/2021 0936  ? HGBUR SMALL (A) 06/07/2021 0936  ? [place] NEGATIVE 06/07/2021 0936  ? [place] NEGATIVE 06/07/2021 0936  ? PROTEINUR 30 (A) 06/07/2021 0936  ? NITRITE NEGATIVE 06/07/2021 0936  ? LEUKOCYTESUR NEGATIVE 06/07/2021 0936  ? ?Sepsis Labs: ?'@LABRCNTIP'$ (procalcitonin:4,lacticidven:4) ? ?)No results found for this or any previous visit (from the past 240 hour(s)).  ? ? ? ? ? ?Radiology Studies: ?No results found. ? ? ? ? ? ?Scheduled Meds: ? atorvastatin  10 mg Oral Daily  ? Chlorhexidine Gluconate Cloth  6 each Topical Daily  ? enoxaparin (LOVENOX) injection  40 mg Subcutaneous Q24H  ? enzalutamide  160 mg Oral Daily  ? [START  ON 06/10/2021] feeding supplement  296 mL Oral Once  ? hydrOXYzine  10 mg Oral Daily  ? irbesartan  150 mg Oral Daily  ? loratadine  10 mg Oral Daily  ? pantoprazole  40 mg Oral Daily  ? potassium chloride SA  20 mEq Oral BID  ? sodium chloride flush  10-40 mL Intracatheter Q12H  ? ?Continuous Infusions: ? [START ON 06/10/2021] cefoTEtan (CEFOTAN) IV    ? ? ? LOS: 2 days  ? ? ?Time spent: 35 minutes. ? ? ? ?Dana Allan, MD  ?[facility] ?Pager #: [phone] ?7PM-7AM contact night coverage as above ? ? ?  ?

## 2021-06-09 NOTE — Progress Notes (Signed)
2 Days Post-Op  ? ?Subjective/Chief Complaint: ?No complaints ? ? ?Objective: ?Vital signs in last 24 hours: ?Temp:  [97.9 ?F (36.6 ?C)-98.3 ?F (36.8 ?C)] 98 ?F (36.7 ?C) (04/09 0342) ?Pulse Rate:  [71-78] 72 (04/09 0342) ?Resp:  [16-18] 16 (04/09 0342) ?BP: (150-176)/(82-95) 154/82 (04/09 0342) ?SpO2:  [99 %-100 %] 100 % (04/09 0342) ?Last BM Date : 06/07/21 ? ?Intake/Output from previous day: ?04/08 0701 - 04/09 0700 ?In: 2376 [P.O.:1320; I.V.:1056] ?Out: -  ?Intake/Output this shift: ?No intake/output data recorded. ? ?Exam: ?Abdomen remains soft, NT,ND ? ?Lab Results:  ?Recent Labs  ?  06/07/21 ?5176 06/08/21 ?1607  ?WBC 11.5* 6.9  ?HGB 14.4 12.8*  ?HCT 42.2 37.7*  ?PLT 270 238  ? ?BMET ?Recent Labs  ?  06/07/21 ?1008 06/08/21 ?3710  ?NA 133* 133*  ?K 4.0 3.8  ?CL 103 104  ?CO2 21* 24  ?GLUCOSE 119* 96  ?BUN 21 12  ?CREATININE 0.75 0.71  ?CALCIUM 8.7* 8.3*  ? ?PT/INR ?No results for input(s): LABPROT, INR in the last 72 hours. ?ABG ?No results for input(s): PHART, HCO3 in the last 72 hours. ? ?Invalid input(s): PCO2, PO2 ? ?Studies/Results: ?DG ABD ACUTE 2+V W 1V CHEST ? ?Result Date: 06/07/2021 ?CLINICAL DATA:  Abdominal pain and nausea, history of volvulus EXAM: DG ABDOMEN ACUTE WITH 1 VIEW CHEST COMPARISON:  KUB and CT abdomen/pelvis 03/07/2021 FINDINGS: Chest: A right chest wall port is in place. The cardiomediastinal silhouette is normal. There is no focal consolidation or pulmonary edema. There is no pleural effusion or pneumothorax Abdomen: There is a loop of large bowel in the mid abdomen which is dilated measuring up to 11.8 cm with a layering fluid level. There is no evidence of free intraperitoneal air. There is no abnormal soft tissue calcification. There is no acute osseous abnormality. There is levoscoliosis of the lumbar spine. IMPRESSION: Findings above concerning for recurrent sigmoid volvulus. Recommend surgical consultation. These results were called by telephone at the time of interpretation on  06/07/2021 at 11:06 am to provider Sherwood Gambler , who verbally acknowledged these results. Electronically Signed   By: Valetta Mole M.D.   On: 06/07/2021 11:03   ? ?Anti-infectives: ?Anti-infectives (From admission, onward)  ? ? None  ? ?  ? ? ?Assessment/Plan: ?Recurrent Sigmoid Volvulus ? ?Gentle bowel prep today ?Likely Sigmoid resection tomorrow by Dr. Hassell Done, our acute care surgeon this week at his discretion. ? ? ?Coralie Keens ?06/09/2021 ? ?

## 2021-06-10 ENCOUNTER — Inpatient Hospital Stay (HOSPITAL_COMMUNITY): Payer: Medicare Other | Admitting: Certified Registered"

## 2021-06-10 ENCOUNTER — Other Ambulatory Visit: Payer: Self-pay

## 2021-06-10 ENCOUNTER — Encounter (HOSPITAL_COMMUNITY): Payer: Self-pay

## 2021-06-10 ENCOUNTER — Encounter (HOSPITAL_COMMUNITY)
Admission: EM | Disposition: A | Discharge status: Home / Self Care | Payer: Self-pay | Source: Home / Self Care | Attending: Internal Medicine

## 2021-06-10 DIAGNOSIS — K562 Volvulus: Secondary | ICD-10-CM | POA: Diagnosis not present

## 2021-06-10 HISTORY — PX: LAPAROSCOPIC SIGMOID COLECTOMY: SHX5928

## 2021-06-10 LAB — BASIC METABOLIC PANEL
Anion gap: 5 (ref 5–15)
BUN: 8 mg/dL (ref 8–23)
CO2: 24 mmol/L (ref 22–32)
Calcium: 8.1 mg/dL — ABNORMAL LOW (ref 8.9–10.3)
Chloride: 107 mmol/L (ref 98–111)
Creatinine, Ser: 0.58 mg/dL — ABNORMAL LOW (ref 0.61–1.24)
GFR, Estimated: >60 mL/min (ref >60)
Glucose, Bld: 97 mg/dL (ref 70–99)
Potassium: 3.5 mmol/L (ref 3.5–5.1)
Sodium: 136 mmol/L (ref 135–145)

## 2021-06-10 SURGERY — COLECTOMY, SIGMOID, LAPAROSCOPIC
Anesthesia: General | Site: Abdomen

## 2021-06-10 MED ORDER — ACETAMINOPHEN 10 MG/ML IV SOLN
INTRAVENOUS | Status: DC | PRN
Start: 2021-06-10 — End: 2021-06-10
  Administered 2021-06-10: 1000 mg via INTRAVENOUS

## 2021-06-10 MED ORDER — MORPHINE SULFATE (PF) 2 MG/ML IV SOLN
1.0000 mg | INTRAVENOUS | Status: DC | PRN
  Administered 2021-06-10: 1 mg via INTRAVENOUS
  Filled 2021-06-10: qty 1

## 2021-06-10 MED ORDER — FENTANYL CITRATE (PF) 100 MCG/2ML IJ SOLN
INTRAMUSCULAR | Status: AC
  Filled 2021-06-10: qty 2

## 2021-06-10 MED ORDER — PHENYLEPHRINE 40 MCG/ML (10ML) SYRINGE FOR IV PUSH (FOR BLOOD PRESSURE SUPPORT)
PREFILLED_SYRINGE | INTRAVENOUS | Status: DC | PRN
Start: 2021-06-10 — End: 2021-06-10
  Administered 2021-06-10: 160 ug via INTRAVENOUS
  Administered 2021-06-10 (×2): 120 ug via INTRAVENOUS

## 2021-06-10 MED ORDER — ROCURONIUM BROMIDE 10 MG/ML (PF) SYRINGE
PREFILLED_SYRINGE | INTRAVENOUS | Status: DC | PRN
  Administered 2021-06-10: 20 mg via INTRAVENOUS
  Administered 2021-06-10: 80 mg via INTRAVENOUS

## 2021-06-10 MED ORDER — OXYCODONE HCL 5 MG PO TABS: 5.0000 mg | ORAL_TABLET | ORAL | Status: DC | PRN

## 2021-06-10 MED ORDER — FENTANYL CITRATE PF 50 MCG/ML IJ SOSY
25.0000 ug | PREFILLED_SYRINGE | INTRAMUSCULAR | Status: DC | PRN
  Administered 2021-06-10: 50 ug via INTRAVENOUS

## 2021-06-10 MED ORDER — LACTATED RINGERS IR SOLN
Status: DC | PRN
  Administered 2021-06-10: 1000 mL

## 2021-06-10 MED ORDER — PROPOFOL 10 MG/ML IV BOLUS
INTRAVENOUS | Status: DC | PRN
  Administered 2021-06-10: 200 mg via INTRAVENOUS

## 2021-06-10 MED ORDER — PHENYLEPHRINE HCL-NACL 20-0.9 MG/250ML-% IV SOLN
INTRAVENOUS | Status: DC | PRN
  Administered 2021-06-10: 25 ug/min via INTRAVENOUS

## 2021-06-10 MED ORDER — FENTANYL CITRATE (PF) 100 MCG/2ML IJ SOLN
INTRAMUSCULAR | Status: DC | PRN
  Administered 2021-06-10: 50 ug via INTRAVENOUS
  Administered 2021-06-10: 100 ug via INTRAVENOUS
  Administered 2021-06-10: 50 ug via INTRAVENOUS

## 2021-06-10 MED ORDER — MIDAZOLAM HCL 2 MG/2ML IJ SOLN
INTRAMUSCULAR | Status: AC
  Filled 2021-06-10: qty 2

## 2021-06-10 MED ORDER — ONDANSETRON HCL 4 MG/2ML IJ SOLN: 4.0000 mg | Freq: Once | INTRAMUSCULAR | Status: DC | PRN

## 2021-06-10 MED ORDER — SUGAMMADEX SODIUM 500 MG/5ML IV SOLN
INTRAVENOUS | Status: DC | PRN
  Administered 2021-06-10: 300 mg via INTRAVENOUS

## 2021-06-10 MED ORDER — LACTATED RINGERS IV SOLN
INTRAVENOUS | Status: DC
Start: 2021-06-10 — End: 2021-06-10

## 2021-06-10 MED ORDER — ACETAMINOPHEN 500 MG PO TABS
1000.0000 mg | ORAL_TABLET | Freq: Four times a day (QID) | ORAL | Status: DC
  Administered 2021-06-10 – 2021-06-12 (×7): 1000 mg via ORAL
  Filled 2021-06-10 (×7): qty 2

## 2021-06-10 MED ORDER — CHLORHEXIDINE GLUCONATE 0.12 % MT SOLN
15.0000 mL | Freq: Once | OROMUCOSAL | Status: AC
  Administered 2021-06-10: 15 mL via OROMUCOSAL

## 2021-06-10 MED ORDER — BUPIVACAINE LIPOSOME 1.3 % IJ SUSP
INTRAMUSCULAR | Status: DC | PRN
  Administered 2021-06-10: 20 mL

## 2021-06-10 MED ORDER — SODIUM CHLORIDE (PF) 0.9 % IJ SOLN
INTRAMUSCULAR | Status: AC
  Filled 2021-06-10: qty 10

## 2021-06-10 MED ORDER — MEPERIDINE HCL 50 MG/ML IJ SOLN: 6.2500 mg | INTRAMUSCULAR | Status: DC | PRN

## 2021-06-10 MED ORDER — OXYCODONE HCL 5 MG/5ML PO SOLN: 5.0000 mg | Freq: Once | ORAL | Status: AC | PRN

## 2021-06-10 MED ORDER — FENTANYL CITRATE PF 50 MCG/ML IJ SOSY
PREFILLED_SYRINGE | INTRAMUSCULAR | Status: AC
  Filled 2021-06-10: qty 1

## 2021-06-10 MED ORDER — OXYCODONE HCL 5 MG PO TABS
ORAL_TABLET | ORAL | Status: AC
  Filled 2021-06-10: qty 1

## 2021-06-10 MED ORDER — 0.9 % SODIUM CHLORIDE (POUR BTL) OPTIME
TOPICAL | Status: DC | PRN
  Administered 2021-06-10: 1000 mL

## 2021-06-10 MED ORDER — ACETAMINOPHEN 325 MG PO TABS: 325.0000 mg | ORAL_TABLET | ORAL | Status: DC | PRN

## 2021-06-10 MED ORDER — ACETAMINOPHEN 10 MG/ML IV SOLN
INTRAVENOUS | Status: AC
  Filled 2021-06-10: qty 100

## 2021-06-10 MED ORDER — ENOXAPARIN SODIUM 40 MG/0.4ML IJ SOSY
40.0000 mg | PREFILLED_SYRINGE | INTRAMUSCULAR | Status: DC
  Filled 2021-06-10: qty 0.4

## 2021-06-10 MED ORDER — OXYCODONE HCL 5 MG PO TABS
5.0000 mg | ORAL_TABLET | Freq: Once | ORAL | Status: AC | PRN
  Administered 2021-06-10: 5 mg via ORAL

## 2021-06-10 MED ORDER — METHOCARBAMOL 500 MG PO TABS: 500.0000 mg | ORAL_TABLET | Freq: Four times a day (QID) | ORAL | Status: DC | PRN

## 2021-06-10 MED ORDER — BUPIVACAINE LIPOSOME 1.3 % IJ SUSP
INTRAMUSCULAR | Status: AC
  Filled 2021-06-10: qty 20

## 2021-06-10 MED ORDER — ACETAMINOPHEN 160 MG/5ML PO SOLN: 325.0000 mg | ORAL | Status: DC | PRN

## 2021-06-10 MED ORDER — PHENYLEPHRINE HCL (PRESSORS) 10 MG/ML IV SOLN
INTRAVENOUS | Status: AC
  Filled 2021-06-10: qty 1

## 2021-06-10 MED ORDER — LIDOCAINE 20MG/ML (2%) 15 ML SYRINGE OPTIME
INTRAMUSCULAR | Status: DC | PRN
  Administered 2021-06-10: 60 mg via INTRAVENOUS

## 2021-06-10 MED ORDER — DEXAMETHASONE SODIUM PHOSPHATE 10 MG/ML IJ SOLN
INTRAMUSCULAR | Status: DC | PRN
  Administered 2021-06-10: 10 mg via INTRAVENOUS

## 2021-06-10 SURGICAL SUPPLY — 83 items
APPLIER CLIP 5 13 M/L LIGAMAX5 (MISCELLANEOUS)
APPLIER CLIP ROT 10 11.4 M/L (STAPLE)
BAG COUNTER SPONGE SURGICOUNT (BAG) IMPLANT
BLADE EXTENDED COATED 6.5IN (ELECTRODE) IMPLANT
CABLE HIGH FREQUENCY MONO STRZ (ELECTRODE) ×2 IMPLANT
CELLS DAT CNTRL 66122 CELL SVR (MISCELLANEOUS) ×1 IMPLANT
CHLORAPREP W/TINT 26 (MISCELLANEOUS) ×2 IMPLANT
CLIP APPLIE 5 13 M/L LIGAMAX5 (MISCELLANEOUS) IMPLANT
CLIP APPLIE ROT 10 11.4 M/L (STAPLE) IMPLANT
COUNTER NEEDLE 20 DBL MAG RED (NEEDLE) ×2 IMPLANT
COVER MAYO STAND STRL (DRAPES) ×5 IMPLANT
COVER SURGICAL LIGHT HANDLE (MISCELLANEOUS) ×2 IMPLANT
DRAPE LAPAROSCOPIC ABDOMINAL (DRAPES) ×2 IMPLANT
DRSG OPSITE POSTOP 4X10 (GAUZE/BANDAGES/DRESSINGS) IMPLANT
DRSG OPSITE POSTOP 4X6 (GAUZE/BANDAGES/DRESSINGS) IMPLANT
DRSG OPSITE POSTOP 4X8 (GAUZE/BANDAGES/DRESSINGS) IMPLANT
ELECT REM PT RETURN 15FT ADLT (MISCELLANEOUS) ×2 IMPLANT
GAUZE SPONGE 4X4 12PLY STRL (GAUZE/BANDAGES/DRESSINGS) IMPLANT
GLOVE BIO SURGEON STRL SZ7 (GLOVE) ×3 IMPLANT
GLOVE BIO SURGEON STRL SZ7.5 (GLOVE) ×3 IMPLANT
GLOVE BIOGEL PI IND STRL 6.5 (GLOVE) IMPLANT
GLOVE BIOGEL PI INDICATOR 6.5 (GLOVE) ×1
GLOVE SURG LX 8.0 MICRO (GLOVE) ×2
GLOVE SURG LX STRL 8.0 MICRO (GLOVE) ×2 IMPLANT
GLOVE SURG UNDER POLY LF SZ6.5 (GLOVE) ×1 IMPLANT
GOWN STRL REUS W/ TWL XL LVL3 (GOWN DISPOSABLE) ×6 IMPLANT
GOWN STRL REUS W/TWL XL LVL3 (GOWN DISPOSABLE) ×6
HANDLE STAPLE EGIA 4 XL (STAPLE) ×1 IMPLANT
IRRIG SUCT STRYKERFLOW 2 WTIP (MISCELLANEOUS) ×2
IRRIGATION SUCT STRKRFLW 2 WTP (MISCELLANEOUS) ×1 IMPLANT
KIT TURNOVER KIT A (KITS) ×1 IMPLANT
LEGGING LITHOTOMY PAIR STRL (DRAPES) ×1 IMPLANT
LIGASURE IMPACT 36 18CM CVD LR (INSTRUMENTS) ×1 IMPLANT
PACK COLON (CUSTOM PROCEDURE TRAY) ×2 IMPLANT
PAD POSITIONING PINK XL (MISCELLANEOUS) ×2 IMPLANT
PENCIL SMOKE EVACUATOR (MISCELLANEOUS) IMPLANT
PROTECTOR NERVE ULNAR (MISCELLANEOUS) ×3 IMPLANT
RELOAD EGIA 45 MED/THCK PURPLE (STAPLE) ×1 IMPLANT
RELOAD EGIA 60 MED/THCK PURPLE (STAPLE) ×6 IMPLANT
RELOAD PROXIMATE 75MM BLUE (ENDOMECHANICALS) IMPLANT
RELOAD STAPLE 60 MED/THCK ART (STAPLE) IMPLANT
RELOAD STAPLE 75 3.8 BLU REG (ENDOMECHANICALS) IMPLANT
RETRACTOR WND ALEXIS 18 MED (MISCELLANEOUS) IMPLANT
RTRCTR WOUND ALEXIS 18CM MED (MISCELLANEOUS) ×2
SCISSORS LAP 5X45 EPIX DISP (ENDOMECHANICALS) ×2 IMPLANT
SET TUBE SMOKE EVAC HIGH FLOW (TUBING) ×2 IMPLANT
SHEARS HARMONIC ACE PLUS 36CM (ENDOMECHANICALS) ×1 IMPLANT
SLEEVE XCEL OPT CAN 5 100 (ENDOMECHANICALS) ×4 IMPLANT
SPIKE FLUID TRANSFER (MISCELLANEOUS) ×1 IMPLANT
STAPLER CIRCULAR MANUAL XL 29 (STAPLE) IMPLANT
STAPLER CVD CUT BL 40 RELOAD (ENDOMECHANICALS) IMPLANT
STAPLER CVD CUT BLU 40 RELOAD (ENDOMECHANICALS) IMPLANT
STAPLER ECHELON POWER CIR 29 (STAPLE) IMPLANT
STAPLER ECHELON POWER CIR 31 (STAPLE) IMPLANT
STAPLER GUN LINEAR PROX 60 (STAPLE) IMPLANT
STAPLER PROXIMATE 75MM BLUE (STAPLE) IMPLANT
STAPLER VISISTAT 35W (STAPLE) ×2 IMPLANT
SUT CHROMIC 3 0 SH 27 (SUTURE) IMPLANT
SUT MNCRL AB 4-0 PS2 18 (SUTURE) ×2 IMPLANT
SUT NOVA 1 T20/GS 25DT (SUTURE) ×2 IMPLANT
SUT PDS AB 1 CTX 36 (SUTURE) IMPLANT
SUT PDS AB 1 TP1 96 (SUTURE) IMPLANT
SUT PDS AB 4-0 SH 27 (SUTURE) ×1 IMPLANT
SUT PROLENE 2 0 KS (SUTURE) IMPLANT
SUT SILK 2 0 (SUTURE)
SUT SILK 2 0 SH CR/8 (SUTURE) ×1 IMPLANT
SUT SILK 2-0 18XBRD TIE 12 (SUTURE) ×1 IMPLANT
SUT SILK 3 0 (SUTURE) ×1
SUT SILK 3 0 SH CR/8 (SUTURE) ×3 IMPLANT
SUT SILK 3-0 18XBRD TIE 12 (SUTURE) ×1 IMPLANT
SUT VIC AB 2-0 SH 27 (SUTURE) ×1
SUT VIC AB 2-0 SH 27XBRD (SUTURE) IMPLANT
SUT VIC AB 4-0 SH 18 (SUTURE) ×1 IMPLANT
SYS LAPSCP GELPORT 120MM (MISCELLANEOUS)
SYS WOUND ALEXIS 18CM MED (MISCELLANEOUS)
SYSTEM LAPSCP GELPORT 120MM (MISCELLANEOUS) IMPLANT
SYSTEM WOUND ALEXIS 18CM MED (MISCELLANEOUS) IMPLANT
TOWEL OR NON WOVEN STRL DISP B (DISPOSABLE) ×2 IMPLANT
TRAY FOLEY MTR SLVR 14FR STAT (SET/KITS/TRAYS/PACK) IMPLANT
TRAY FOLEY MTR SLVR 16FR STAT (SET/KITS/TRAYS/PACK) ×1 IMPLANT
TROCAR BLADELESS OPT 5 100 (ENDOMECHANICALS) ×2 IMPLANT
TROCAR XCEL NON-BLD 11X100MML (ENDOMECHANICALS) IMPLANT
TUBING CONNECTING 10 (TUBING) IMPLANT

## 2021-06-10 NOTE — Progress Notes (Signed)
Spoke to [place]  received report for surgery today ?

## 2021-06-10 NOTE — Interval H&P Note (Signed)
History and Physical Interval Note: ? ?06/10/2021 ?11:00 AM ? ?Philip [name]  has presented today for surgery, with the diagnosis of Recurrent sigmoid volvulus.  The various methods of treatment have been discussed with the patient and family. After consideration of risks, benefits and other options for treatment, the patient has consented to  Procedure(s): ?LAPAROSCOPIC SIGMOID COLECTOMY (N/A) as a surgical intervention.  The patient's history has been reviewed, patient examined, no change in status, stable for surgery.  I have reviewed the patient's chart and labs.  Questions were answered to the patient's satisfaction.   ? ? ?[name] Earls ? ? ?

## 2021-06-10 NOTE — Op Note (Signed)
Philip [name]  [birth year] ? ? ?06/10/2021 ? ? ? ?PCP:  [name], MD ? ? ?Surgeon: Kaylyn Lim, MD, FACS ? ?Asst:  Annye English, MD, FACS; Cameron Sprang, MD ? ?Anes:  general ? ?Preop Dx: Recurrent sigmoid volvulus ?Postop Dx: same ? ?Procedure: Lap assisted sigmoid colectomy ?Location Surgery: [facility] ?Complications:  none ? ?EBL:   minimal cc ? ?Drains: none ? ?Description of Procedure: ? The patient was taken to OR 4 .  After anesthesia was administered and the patient was prepped  with chloroprep  and a timeout was performed.  Access to the abdomen was achieved through the right upper quadrant with a 5 mm Optiview.  Upon entering we surveyed the abdomen could see the large dilated segment of sigmoid colon that had been involved with the volvulus.  He did have some other adhesions and a small bowel which may have been related to his prior advanced prostate cancer.  No grossly abnormal or suspicious nodules were noted suggesting any kind of cancer. ? ?With the sigmoid was fairly redundant so I made I had made a second 5 mm port in the midline to help me manipulate the bowel I use that to enter the abdomen and placed a wound protector.  The large dilated segment of the colon was then easily brought up into the wound.  We identified areas proximally and distally which we could resect.  We divided the proximal bowel with a Covidien Endo GIA 6 cm and then used 2 applications of the same purple load distally to complete the division.  We went through the mesentery with LigaSure.  Bleeding was controlled.  A functional end-to-end anastomosis was created lining up the antimesenteric border of the bowel and an opening creating colotomies proximally distally and then inner and inner these with the arms of the purple load Covidien 6 cm.  This fired and the common defect was then lined up placing a 3-0 silk on both staple lines and then a running inverting 4-0 PDS in a canal fashion was done as an inner layer.  An outer layer of  of 3-0 silk Lembert's were placed.  The there was no significant defect that we were closed.  The anastomosis felt patent and it looked good from the inside and that no bleeding was noted. ? ?Per protocol we removed all the wound protector and all the drapes were redraped and gowns and gloves were changed.  The peritoneal layer was closed with a running 2-0 Vicryl.  Inverted sutures #1 Novafil were placed in the fascia..  The wound was irrigated and closed with 4-0 Monocryl and Dermabond. ? ?The patient tolerated the procedure well and was taken to the PACU in stable condition.   ? ? ?Matt B. Hassell Done, MD, FACS ?[facility], [place] ?[phone]  ?

## 2021-06-10 NOTE — Anesthesia Preprocedure Evaluation (Addendum)
Anesthesia Evaluation  ?Patient identified by MRN, date of birth, ID band ?Patient awake ? ? ? ?Reviewed: ?Allergy & Precautions, NPO status , Patient's Chart, lab work & pertinent test results ? ?Airway ?Mallampati: III ? ?TM Distance: >3 FB ?Neck ROM: Full ? ? ? Dental ?no notable dental hx. ?(+) Teeth Intact, Dental Advisory Given ?  ?Pulmonary ?neg pulmonary ROS,  ?  ?Pulmonary exam normal ?breath sounds clear to auscultation ? ? ? ? ? ? Cardiovascular ?hypertension, Pt. on medications ?Normal cardiovascular exam ?Rhythm:Regular Rate:Normal ? ?HLD ?  ?Neuro/Psych ?negative neurological ROS ? negative psych ROS  ? GI/Hepatic ?Neg liver ROS, GERD  ,  ?Endo/Other  ?negative endocrine ROS ? Renal/GU ?negative Renal ROS  ? ?Prostate CA ? ?  ?Musculoskeletal ?negative musculoskeletal ROS ?(+)  ? Abdominal ?  ?Peds ? Hematology ?  ?Anesthesia Other Findings ?Recurrent sigmoid volvulus ? Reproductive/Obstetrics ? ?  ? ? ? ? ? ? ? ? ? ? ? ? ? ?  ?  ? ? ? ? ? ? ? ?Anesthesia Physical ?Anesthesia Plan ? ?ASA: 2 ? ?Anesthesia Plan: General  ? ?Post-op Pain Management: Toradol IV (intra-op)* and Ofirmev IV (intra-op)*  ? ?Induction: Intravenous ? ?PONV Risk Score and Plan: 2 and Dexamethasone, Ondansetron and Treatment may vary due to age or medical condition ? ?Airway Management Planned: Oral ETT ? ?Additional Equipment:  ? ?Intra-op Plan:  ? ?Post-operative Plan: Extubation in OR ? ?Informed Consent: I have reviewed the patients History and Physical, chart, labs and discussed the procedure including the risks, benefits and alternatives for the proposed anesthesia with the patient or authorized representative who has indicated his/her understanding and acceptance.  ? ? ? ?Dental advisory given ? ?Plan Discussed with: CRNA ? ?Anesthesia Plan Comments:   ? ? ? ? ? ?Anesthesia Quick Evaluation ? ?

## 2021-06-10 NOTE — Anesthesia Procedure Notes (Signed)
Procedure Name: Intubation ?Date/Time: 06/10/2021 11:50 AM ?Performed by: Pilar Grammes, CRNA ?Pre-anesthesia Checklist: Patient identified, Emergency Drugs available, Suction available, Patient being monitored and Timeout performed ?Patient Re-evaluated:Patient Re-evaluated prior to induction ?Oxygen Delivery Method: Circle system utilized ?Preoxygenation: Pre-oxygenation with 100% oxygen ?Induction Type: IV induction ?Ventilation: Mask ventilation without difficulty ?Laryngoscope Size: Sabra Heck and 2 ?Grade View: Grade I ?Tube type: Oral ?Tube size: 7.5 mm ?Number of attempts: 1 ?Airway Equipment and Method: Stylet ?Placement Confirmation: positive ETCO2, ETT inserted through vocal cords under direct vision, CO2 detector and breath sounds checked- equal and bilateral ?Secured at: 23 cm ?Tube secured with: Tape ?Dental Injury: Teeth and Oropharynx as per pre-operative assessment  ? ? ? ? ?

## 2021-06-10 NOTE — Progress Notes (Signed)
?PROGRESS NOTE ? ? ? ?Philip [name]  VQM:[id number] DOB: 1946-02-27 DOA: 06/07/2021 ?PCP: [name], MD  ?Outpatient Specialists:  ? ? ? ?Brief Narrative:  ?Patient is a 76 year old Caucasian male with past medical history significant for prostate cancer, hypertension, hyperglycemia, aortic atherosclerosis, chronic idiopathic constipation, GERD, hyperlipidemia, drug-induced hypokalemia, iron deficiency anemia and history of volvulus in January this year requiring flexible sigmoidoscopy decompression by Dr. Havery Moros.  Patient declined colectomy in January.  Patient presents with constipation and abdominal pain.  Surgery is planned for Monday tentatively.  Input from the GI and surgical team is appreciated.   ? ?Patient seen alongside patient's nurse today.  No new complaints. ? ?06/10/2021:  No new complaints.  For surgery today. ? ?Assessment & Plan: ?  ?Principal Problem: ?  Sigmoid volvulus ([place]) ?Active Problems: ?  Prostate cancer ([place]) ?  Atherosclerosis of aorta ([place]) ?  Seasonal allergic rhinitis due to pollen ?  Gastroesophageal reflux disease without esophagitis ?  Hyperlipidemia LDL goal <100 ?  Essential hypertension ?  Iron deficiency anemia ? ? ?Sigmoid volvulus ([place]) ?S/P flexible sigmoidoscopy decompression. ?Admit to telemetry/inpatient. ?Clear liquid diet. ?May advance to full liquid diet. ?Do not advance beyond full liquid diet. ?Analgesics as needed. ?Antiemetics as needed. ?Continue gentle IV fluids. ?Gastroenterology consult/procedure appreciated. ?General surgery consult appreciated. ?06/10/2021: For surgery today.  ? ? Prostate cancer ([place]) ?Continue enzalutamide 160 mg p.o. daily. ?  ?  Atherosclerosis of aorta ([place]) ?  Hyperlipidemia LDL goal <100 ?Continue atorvastatin 10 mg p.o. daily. ?  ?  Seasonal allergic rhinitis due to pollen ?Continue Allegra or formulary equivalent. ?  ?  Gastroesophageal reflux disease without esophagitis ?Continue PPI. ?  ?  Essential hypertension ?Continue  Benicar or formulary equivalent. ?Monitor BP, renal function and electrolytes. ?  ?  Iron deficiency anemia (history of) ?Not anemic at the moment. ?Hold iron supplementation to avoid constipation. ? ? ?DVT prophylaxis: Lovenox ?Code Status: Full code ?Family Communication:  ?Disposition Plan: Home eventually ? ? ?Consultants:  ?Surgery ?GI ? ?Procedures:  ?Colectomy is planned on Monday, 06/10/2021. ? ?Antimicrobials:  ?None ? ? ?Subjective: ?No new complaints. ?No abdominal pain. ?No constipation ? ?Objective: ?Vitals:  ? 06/09/21 2049 06/10/21 0219 06/10/21 1037 06/10/21 1047  ?BP: (!) 159/99 (!) 145/76 (!) 170/92   ?Pulse: 78 64 72   ?Resp: '16 16 16   '$ ?Temp: (!) 97.3 ?F (36.3 ?C) 97.8 ?F (36.6 ?C) 98.5 ?F (36.9 ?C)   ?TempSrc: Oral Oral Oral   ?SpO2: 100% 100% 99%   ?Weight:    84.4 kg  ?Height:    '6\' 4"'$  (1.93 m)  ? ? ?Intake/Output Summary (Last 24 hours) at 06/10/2021 1430 ?Last data filed at 06/10/2021 1423 ?Gross per 24 hour  ?Intake 3024 ml  ?Output 470 ml  ?Net 2554 ml  ? ? ?Filed Weights  ? 06/07/21 1142 06/07/21 1813 06/10/21 1047  ?Weight: 86.6 kg 84.4 kg 84.4 kg  ? ? ?Examination: ? ?General exam: Appears calm and comfortable ?HEENT: Mild pallor.  No jaundice. ?Neck: Supple.  No raised JVD. ?Respiratory system: Clear to auscultation.  ?Cardiovascular system: S1 & S2 heard ?Gastrointestinal system: Abdomen is soft and nontender.   ?Central nervous system: Alert and oriented.  Patient moves all extremities.   ?Extremities: No leg edema. ? ?Data Reviewed: I have personally reviewed following labs and imaging studies ? ?CBC: ?Recent Labs  ?Lab 06/07/21 ?0934 06/08/21 ?9326  ?WBC 11.5* 6.9  ?HGB 14.4 12.8*  ?HCT 42.2 37.7*  ?  MCV 91.3 91.7  ?PLT 270 238  ? ? ?Basic Metabolic Panel: ?Recent Labs  ?Lab 06/07/21 ?1008 06/08/21 ?3235 06/10/21 ?0336  ?NA 133* 133* 136  ?K 4.0 3.8 3.5  ?CL 103 104 107  ?CO2 21* 24 24  ?GLUCOSE 119* 96 97  ?BUN '21 12 8  '$ ?CREATININE 0.75 0.71 0.58*  ?CALCIUM 8.7* 8.3* 8.1*   ? ? ?GFR: ?Estimated Creatinine Clearance: 93.8 mL/min (A) (by C-G formula based on SCr of 0.58 mg/dL (L)). ?Liver Function Tests: ?Recent Labs  ?Lab 06/07/21 ?1008  ?AST 19  ?ALT 15  ?ALKPHOS 40  ?BILITOT 0.7  ?PROT 7.1  ?ALBUMIN 4.0  ? ? ?Recent Labs  ?Lab 06/07/21 ?1008  ?LIPASE 34  ? ? ?No results for input(s): AMMONIA in the last 168 hours. ?Coagulation Profile: ?No results for input(s): INR, PROTIME in the last 168 hours. ?Cardiac Enzymes: ?No results for input(s): CKTOTAL, CKMB, CKMBINDEX, TROPONINI in the last 168 hours. ?BNP (last 3 results) ?No results for input(s): PROBNP in the last 8760 hours. ?HbA1C: ?No results for input(s): HGBA1C in the last 72 hours. ?CBG: ?No results for input(s): GLUCAP in the last 168 hours. ?Lipid Profile: ?No results for input(s): CHOL, HDL, LDLCALC, TRIG, CHOLHDL, LDLDIRECT in the last 72 hours. ?Thyroid Function Tests: ?No results for input(s): TSH, T4TOTAL, FREET4, T3FREE, THYROIDAB in the last 72 hours. ?Anemia Panel: ?No results for input(s): VITAMINB12, FOLATE, FERRITIN, TIBC, IRON, RETICCTPCT in the last 72 hours. ?Urine analysis: ?   ?Component Value Date/Time  ? COLORURINE YELLOW 06/07/2021 0936  ? APPEARANCEUR CLEAR 06/07/2021 0936  ? LABSPEC 1.023 06/07/2021 0936  ? PHURINE 6.0 06/07/2021 0936  ? GLUCOSEU NEGATIVE 06/07/2021 0936  ? HGBUR SMALL (A) 06/07/2021 0936  ? [place] NEGATIVE 06/07/2021 0936  ? [place] NEGATIVE 06/07/2021 0936  ? PROTEINUR 30 (A) 06/07/2021 0936  ? NITRITE NEGATIVE 06/07/2021 0936  ? LEUKOCYTESUR NEGATIVE 06/07/2021 0936  ? ?Sepsis Labs: ?'@LABRCNTIP'$ (procalcitonin:4,lacticidven:4) ? ?) ?Recent Results (from the past 240 hour(s))  ?MRSA Next Gen by PCR, Nasal     Status: None  ? Collection Time: 06/09/21  9:54 PM  ? Specimen: Nasal Mucosa; Nasal Swab  ?Result Value Ref Range Status  ? MRSA by PCR Next Gen NOT DETECTED NOT DETECTED Final  ?  Comment: (NOTE) ?The GeneXpert MRSA Assay (FDA approved for NASAL specimens only), ?is one  component of a comprehensive MRSA colonization surveillance ?program. It is not intended to diagnose MRSA infection nor to guide ?or monitor treatment for MRSA infections. ?Test performance is not FDA approved in patients less than 2 years ?old. ?Performed at [facility], [address] ?[place], [place] [address] ?  ?  ? ? ? ? ? ?Radiology Studies: ?No results found. ? ? ? ? ? ?Scheduled Meds: ? [MAR Hold] atorvastatin  10 mg Oral Daily  ? [MAR Hold] Chlorhexidine Gluconate Cloth  6 each Topical Daily  ? [MAR Hold] enoxaparin (LOVENOX) injection  40 mg Subcutaneous Q24H  ? [MAR Hold] enzalutamide  160 mg Oral Daily  ? [MAR Hold] hydrOXYzine  10 mg Oral Daily  ? [MAR Hold] irbesartan  150 mg Oral Daily  ? [MAR Hold] loratadine  10 mg Oral Daily  ? [MAR Hold] pantoprazole  40 mg Oral Daily  ? [MAR Hold] potassium chloride SA  20 mEq Oral BID  ? [MAR Hold] sodium chloride flush  10-40 mL Intracatheter Q12H  ? ?Continuous Infusions: ? lactated ringers Stopped (06/10/21 0939)  ? lactated ringers 10 mL/hr  at 06/10/21 1100  ? ? ? LOS: 3 days  ? ? ?Time spent: 25 minutes. ? ? ? ?Dana Allan, MD  ?[facility] ?Pager #: [phone] ?7PM-7AM contact night coverage as above ? ? ?  ?

## 2021-06-10 NOTE — Progress Notes (Signed)
MD order for surgery ? ?Prep performed by HS RN Raquel Sarna ? ?PIV inserted by J.Mikelle Myrick, RN ? ?To Surgery via stretcher; ambulated to stretcher without difficulty. Wife accompanied pt to surgery waiting room. ?

## 2021-06-10 NOTE — Anesthesia Postprocedure Evaluation (Signed)
Anesthesia Post Note ? ?Patient: Philip [name] ? ?Procedure(s) Performed: LAPAROSCOPIC ASSISTED SIGMOID COLECTOMY (Abdomen) ? ?  ? ?Patient location during evaluation: PACU ?Anesthesia Type: General ?Level of consciousness: awake and alert ?Pain management: pain level controlled ?Vital Signs Assessment: post-procedure vital signs reviewed and stable ?Respiratory status: spontaneous breathing, nonlabored ventilation, respiratory function stable and patient connected to nasal cannula oxygen ?Cardiovascular status: blood pressure returned to baseline and stable ?Postop Assessment: no apparent nausea or vomiting ?Anesthetic complications: no ? ? ?No notable events documented. ? ?Last Vitals:  ?Vitals:  ? 06/10/21 1433 06/10/21 1445  ?BP: (!) 165/100 (!) 184/99  ?Pulse: 66 69  ?Resp: 13 17  ?Temp: (!) 36.3 ?C   ?SpO2: 100% 100%  ?  ?Last Pain:  ?Vitals:  ? 06/10/21 1449  ?TempSrc:   ?PainSc: 5   ? ? ?  ?  ?  ?  ?  ?  ? ?Sabryn Preslar ? ? ? ? ?

## 2021-06-10 NOTE — Transfer of Care (Signed)
Immediate Anesthesia Transfer of Care Note ? ?Patient: Philip [name] ? ?Procedure(s) Performed: LAPAROSCOPIC ASSISTED SIGMOID COLECTOMY (Abdomen) ? ?Patient Location: PACU ? ?Anesthesia Type:General ? ?Level of Consciousness: awake, oriented, drowsy and patient cooperative ? ?Airway & Oxygen Therapy: Patient Spontanous Breathing and Patient connected to face mask oxygen ? ?Post-op Assessment: Report given to RN and Post -op Vital signs reviewed and stable ? ?Post vital signs: stable ? ?Last Vitals:  ?Vitals Value Taken Time  ?BP 165/100 06/10/21 1433  ?Temp    ?Pulse 66 06/10/21 1435  ?Resp 14 06/10/21 1435  ?SpO2 100 % 06/10/21 1435  ?Vitals shown include unvalidated device data. ? ?Last Pain:  ?Vitals:  ? 06/10/21 1046  ?TempSrc:   ?PainSc: 0-No pain  ?   ? ?  ? ?Complications: No notable events documented. ?

## 2021-06-11 ENCOUNTER — Encounter (HOSPITAL_COMMUNITY): Payer: Self-pay | Admitting: Surgery

## 2021-06-11 DIAGNOSIS — K562 Volvulus: Secondary | ICD-10-CM | POA: Diagnosis not present

## 2021-06-11 LAB — BASIC METABOLIC PANEL
Anion gap: 6 (ref 5–15)
BUN: 9 mg/dL (ref 8–23)
CO2: 24 mmol/L (ref 22–32)
Calcium: 7.8 mg/dL — ABNORMAL LOW (ref 8.9–10.3)
Chloride: 103 mmol/L (ref 98–111)
Creatinine, Ser: 0.7 mg/dL (ref 0.61–1.24)
GFR, Estimated: >60 mL/min (ref >60)
Glucose, Bld: 112 mg/dL — ABNORMAL HIGH (ref 70–99)
Potassium: 3.3 mmol/L — ABNORMAL LOW (ref 3.5–5.1)
Sodium: 133 mmol/L — ABNORMAL LOW (ref 135–145)

## 2021-06-11 LAB — CBC
HCT: 35.9 % — ABNORMAL LOW (ref 39.0–52.0)
Hemoglobin: 12.2 g/dL — ABNORMAL LOW (ref 13.0–17.0)
MCH: 30.9 pg (ref 26.0–34.0)
MCHC: 34 g/dL (ref 30.0–36.0)
MCV: 90.9 fL (ref 80.0–100.0)
Platelets: 205 10*3/uL (ref 150–400)
RBC: 3.95 MIL/uL — ABNORMAL LOW (ref 4.22–5.81)
RDW: 13.2 % (ref 11.5–15.5)
WBC: 9.7 10*3/uL (ref 4.0–10.5)
nRBC: 0 % (ref 0.0–0.2)

## 2021-06-11 MED ORDER — BOOST / RESOURCE BREEZE PO LIQD CUSTOM
1.0000 | Freq: Three times a day (TID) | ORAL | Status: DC
  Administered 2021-06-11 (×2): 1 via ORAL

## 2021-06-11 MED ORDER — ENSURE ENLIVE PO LIQD
237.0000 mL | Freq: Two times a day (BID) | ORAL | Status: DC
  Administered 2021-06-11: 237 mL via ORAL

## 2021-06-11 MED ORDER — ASPIRIN EC 81 MG PO TBEC
81.0000 mg | DELAYED_RELEASE_TABLET | Freq: Every day | ORAL | Status: DC
  Filled 2021-06-11: qty 1

## 2021-06-11 NOTE — Progress Notes (Addendum)
?PROGRESS NOTE ? ? ? ?Philip [name]  KGM:[id number] DOB: 1945/09/10 DOA: 06/07/2021 ?PCP: [name], MD  ?Outpatient Specialists:  ? ? ? ?Brief Narrative:  ?Patient is a 76 year old Caucasian male with past medical history significant for prostate cancer, hypertension, hyperlipidemia, aortic atherosclerosis, GERD, iron deficiency anemia and history of volvulus in January this year requiring flexible sigmoidoscopy decompression by Dr. Havery Moros.  Patient presents with recurrent sigmoid volvulus. ? ?Assessment & Plan: ? ?Recurrent sigmoid volvulus: Status post lap sigmoidectomy by Dr. Hassell Done on 4/10, POD 1 ?-Currently on a clear liquid diet.  Ambulating in the room.  Started on resource breeze.  Surgery following in consultation.  Continue mobilizing.  Supportive care with antiemetics, IV fluids and analgesics as needed.  Initially status post flex sigmoid decompression by GI. ? ?Prostate cancer  ?Continue enzalutamide ?  ?HLD: ?Continue home atorvastatin ? ?Gastroesophageal reflux disease ?Continue home Prilosec ?  ?Essential hypertension ?Continue home Benicar ?  ?Iron deficiency anemia ?Holding home iron supplementation to avoid constipation. ? ? ?DVT prophylaxis: Lovenox ?Code Status: Full code ?Family Communication: wife present at bedside ?Disposition Plan: Home  ? ?Consultants:  ?Surgery ?GI ? ?Procedures:  ?Sigmoidectomy ? ? ?Subjective: ?Ambulating in the room. Not passing any flatus yet. No BM yet. Pain well controlled. ? ?Objective: ?Vitals:  ? 06/10/21 1623 06/10/21 2116 06/11/21 0520 06/11/21 1427  ?BP: (!) 177/92 (!) 146/79 (!) 153/79 (!) 153/86  ?Pulse: 76 74 70 77  ?Resp: '20 16 17 19  '$ ?Temp:  97.9 ?F (36.6 ?C) 98.4 ?F (36.9 ?C) 97.8 ?F (36.6 ?C)  ?TempSrc:  Oral Oral Oral  ?SpO2: 100% 100% 99% 100%  ?Weight:      ?Height:      ? ? ?Intake/Output Summary (Last 24 hours) at 06/11/2021 1728 ?Last data filed at 06/11/2021 1122 ?Gross per 24 hour  ?Intake 480 ml  ?Output --  ?Net 480 ml  ? ? ?Filed Weights   ? 06/07/21 1142 06/07/21 1813 06/10/21 1047  ?Weight: 86.6 kg 84.4 kg 84.4 kg  ? ? ?Examination: ? ?General exam: Appears calm and comfortable ?Respiratory system: Clear to auscultation.  ?Cardiovascular system: S1 & S2 heard ?Gastrointestinal system: Abdomen is soft and nontender.  No bowel sounds ?Central nervous system: Alert and oriented.  Patient moves all extremities.   ?Extremities: No leg edema. ? ?Data Reviewed: I have personally reviewed following labs and imaging studies ? ?CBC: ?Recent Labs  ?Lab 06/07/21 ?0934 06/08/21 ?6440 06/11/21 ?0406  ?WBC 11.5* 6.9 9.7  ?HGB 14.4 12.8* 12.2*  ?HCT 42.2 37.7* 35.9*  ?MCV 91.3 91.7 90.9  ?PLT 270 238 205  ? ? ?Basic Metabolic Panel: ?Recent Labs  ?Lab 06/07/21 ?1008 06/08/21 ?3474 06/10/21 ?2595 06/11/21 ?0406  ?NA 133* 133* 136 133*  ?K 4.0 3.8 3.5 3.3*  ?CL 103 104 107 103  ?CO2 21* '24 24 24  '$ ?GLUCOSE 119* 96 97 112*  ?BUN '21 12 8 9  '$ ?CREATININE 0.75 0.71 0.58* 0.70  ?CALCIUM 8.7* 8.3* 8.1* 7.8*  ? ? ?GFR: ?Estimated Creatinine Clearance: 93.8 mL/min (by C-G formula based on SCr of 0.7 mg/dL). ?Liver Function Tests: ?Recent Labs  ?Lab 06/07/21 ?1008  ?AST 19  ?ALT 15  ?ALKPHOS 40  ?BILITOT 0.7  ?PROT 7.1  ?ALBUMIN 4.0  ? ? ?Recent Labs  ?Lab 06/07/21 ?1008  ?LIPASE 34  ? ? ?No results for input(s): AMMONIA in the last 168 hours. ?Coagulation Profile: ?No results for input(s): INR, PROTIME in the last 168 hours. ?Cardiac Enzymes: ?No results  for input(s): CKTOTAL, CKMB, CKMBINDEX, TROPONINI in the last 168 hours. ?BNP (last 3 results) ?No results for input(s): PROBNP in the last 8760 hours. ?HbA1C: ?No results for input(s): HGBA1C in the last 72 hours. ?CBG: ?No results for input(s): GLUCAP in the last 168 hours. ?Lipid Profile: ?No results for input(s): CHOL, HDL, LDLCALC, TRIG, CHOLHDL, LDLDIRECT in the last 72 hours. ?Thyroid Function Tests: ?No results for input(s): TSH, T4TOTAL, FREET4, T3FREE, THYROIDAB in the last 72 hours. ?Anemia Panel: ?No results for  input(s): VITAMINB12, FOLATE, FERRITIN, TIBC, IRON, RETICCTPCT in the last 72 hours. ?Urine analysis: ?   ?Component Value Date/Time  ? COLORURINE YELLOW 06/07/2021 0936  ? APPEARANCEUR CLEAR 06/07/2021 0936  ? LABSPEC 1.023 06/07/2021 0936  ? PHURINE 6.0 06/07/2021 0936  ? GLUCOSEU NEGATIVE 06/07/2021 0936  ? HGBUR SMALL (A) 06/07/2021 0936  ? [place] NEGATIVE 06/07/2021 0936  ? [place] NEGATIVE 06/07/2021 0936  ? PROTEINUR 30 (A) 06/07/2021 0936  ? NITRITE NEGATIVE 06/07/2021 0936  ? LEUKOCYTESUR NEGATIVE 06/07/2021 0936  ? ?Sepsis Labs: ?'@LABRCNTIP'$ (procalcitonin:4,lacticidven:4) ? ?) ?Recent Results (from the past 240 hour(s))  ?MRSA Next Gen by PCR, Nasal     Status: None  ? Collection Time: 06/09/21  9:54 PM  ? Specimen: Nasal Mucosa; Nasal Swab  ?Result Value Ref Range Status  ? MRSA by PCR Next Gen NOT DETECTED NOT DETECTED Final  ?  Comment: (NOTE) ?The GeneXpert MRSA Assay (FDA approved for NASAL specimens only), ?is one component of a comprehensive MRSA colonization surveillance ?program. It is not intended to diagnose MRSA infection nor to guide ?or monitor treatment for MRSA infections. ?Test performance is not FDA approved in patients less than 2 years ?old. ?Performed at [facility], [address] ?[place], [place] [address] ?  ? ?  ? ? ? ? ? ?Radiology Studies: ?No results found. ? ? ? ? ? ?Scheduled Meds: ? acetaminophen  1,000 mg Oral Q6H  ? [START ON 06/12/2021] aspirin EC  81 mg Oral Daily  ? atorvastatin  10 mg Oral Daily  ? Chlorhexidine Gluconate Cloth  6 each Topical Daily  ? enoxaparin (LOVENOX) injection  40 mg Subcutaneous Q24H  ? enzalutamide  160 mg Oral Daily  ? feeding supplement  1 Container Oral TID BM  ? feeding supplement  237 mL Oral BID BM  ? hydrOXYzine  10 mg Oral Daily  ? irbesartan  150 mg Oral Daily  ? loratadine  10 mg Oral Daily  ? pantoprazole  40 mg Oral Daily  ? potassium chloride SA  20 mEq Oral BID  ? sodium chloride flush  10-40 mL  Intracatheter Q12H  ? ?Continuous Infusions: ? lactated ringers 88 mL/hr at 06/10/21 1549  ? ? ? LOS: 4 days  ? ? ?Time spent: 25 minutes. ? ? ?Author: ?Leslee Home, DO ? ?  ?

## 2021-06-11 NOTE — Care Management Important Message (Signed)
Important Message ? ?Patient Details IM Letter given to the Patient. ?Name: Philip [name] ?MRN: [id number] ?Date of Birth: 20-Jan-1946 ? ? ?Medicare Important Message Given:  Yes ? ? ? ? ?Kerin Salen ?06/11/2021, 10:25 AM ?

## 2021-06-11 NOTE — Progress Notes (Signed)
?  Transition of Care (TOC) Screening Note ? ? ?Patient Details  ?Name: Philip [name] ?Date of Birth: April 08, 1945 ? ? ?Transition of Care (TOC) CM/SW Contact:    ?Jadarious Dobbins, LCSW ?Phone Number: ?06/11/2021, 10:53 AM ? ? ? ?Transition of Care Department [facility]) has reviewed patient and no TOC needs have been identified at this time. We will continue to monitor patient advancement through interdisciplinary progression rounds. If new patient transition needs arise, please place a TOC consult. ? ? ?

## 2021-06-11 NOTE — Progress Notes (Signed)
BRIEF PHARMACY COMMUNICATION NOTE: ? ?Patient is currently prescribed Xtandi '160mg'$  QD prior to admission which was continued upon admission to the hospital.  Medication has been administered from bedside without pharmacy knowledge on 4/8 and 4/9.  Per hospital policy, pharmacy is to store home medications and dispense individual doses at their scheduled times to RN.  Policy explained to patient and family member.  Patient and family would like to keep home medication in their possession during hospital stay.  Informed them that [facility] is not liable for any missing tablets or lost/damaged medication. Patient and family member verbalized understanding. ? ?4/11: This RPH counted #31 tablets of Xtandi '160mg'$ . ? ?Dimple Nanas, PharmD ?06/11/2021 10:43 AM ? ?

## 2021-06-11 NOTE — Progress Notes (Signed)
? ? ?1 Day Post-Op  ?Subjective: ?Patient feels well today and is passing flatus.  No BM.  Pain is well controlled.  States he is hungry. ? ?ROS: See above, otherwise other systems negative ? ?Objective: ?Vital signs in last 24 hours: ?Temp:  [97.4 ?F (36.3 ?C)-98.5 ?F (36.9 ?C)] 98.4 ?F (36.9 ?C) (04/11 0520) ?Pulse Rate:  [66-82] 70 (04/11 0520) ?Resp:  [12-20] 17 (04/11 0520) ?BP: (146-188)/(79-100) 153/79 (04/11 0520) ?SpO2:  [98 %-100 %] 99 % (04/11 0520) ?Weight:  [84.4 kg] 84.4 kg (04/10 1047) ?Last BM Date : 06/09/21 ? ?Intake/Output from previous day: ?04/10 0701 - 04/11 0700 ?In: 1300 [I.V.:1300] ?Out: 470 [Urine:420; Blood:50] ?Intake/Output this shift: ?No intake/output data recorded. ? ?PE: ?Abd: soft, minimally tender, ND, incisions c/d/I with dermabond present. ? ?Lab Results:  ?Recent Labs  ?  06/11/21 ?0406  ?WBC 9.7  ?HGB 12.2*  ?HCT 35.9*  ?PLT 205  ? ?BMET ?Recent Labs  ?  06/10/21 ?0336 06/11/21 ?0406  ?NA 136 133*  ?K 3.5 3.3*  ?CL 107 103  ?CO2 24 24  ?GLUCOSE 97 112*  ?BUN 8 9  ?CREATININE 0.58* 0.70  ?CALCIUM 8.1* 7.8*  ? ?PT/INR ?No results for input(s): LABPROT, INR in the last 72 hours. ?CMP  ?   ?Component Value Date/Time  ? NA 133 (L) 06/11/2021 0406  ? NA 139 02/10/2017 1323  ? K 3.3 (L) 06/11/2021 0406  ? K 3.4 (L) 02/10/2017 1323  ? CL 103 06/11/2021 0406  ? CO2 24 06/11/2021 0406  ? CO2 23 02/10/2017 1323  ? GLUCOSE 112 (H) 06/11/2021 0406  ? GLUCOSE 117 02/10/2017 1323  ? BUN 9 06/11/2021 0406  ? BUN 13.6 02/10/2017 1323  ? CREATININE 0.70 06/11/2021 0406  ? CREATININE 0.83 05/28/2021 1320  ? CREATININE 0.9 02/10/2017 1323  ? CALCIUM 7.8 (L) 06/11/2021 0406  ? CALCIUM 9.2 02/10/2017 1323  ? PROT 7.1 06/07/2021 1008  ? PROT 6.7 02/10/2017 1323  ? ALBUMIN 4.0 06/07/2021 1008  ? ALBUMIN 3.8 02/10/2017 1323  ? AST 19 06/07/2021 1008  ? AST 16 05/28/2021 1320  ? AST 20 02/10/2017 1323  ? ALT 15 06/07/2021 1008  ? ALT 11 05/28/2021 1320  ? ALT 15 02/10/2017 1323  ? ALKPHOS 40 06/07/2021  1008  ? ALKPHOS 50 02/10/2017 1323  ? BILITOT 0.7 06/07/2021 1008  ? BILITOT 0.5 05/28/2021 1320  ? BILITOT 0.52 02/10/2017 1323  ? GFRNONAA >60 06/11/2021 0406  ? GFRNONAA >60 05/28/2021 1320  ? GFRAA >60 11/29/2019 1157  ? ?Lipase  ?   ?Component Value Date/Time  ? LIPASE 34 06/07/2021 1008  ? ? ? ? ? ?Studies/Results: ?No results found. ? ?Anti-infectives: ?Anti-infectives (From admission, onward)  ? ? Start     Dose/Rate Route Frequency Ordered Stop  ? 06/10/21 0600  cefoTEtan (CEFOTAN) 2 g in sodium chloride 0.9 % 100 mL IVPB       ? 2 g ?200 mL/hr over 30 Minutes Intravenous On call to O.R. 06/09/21 0816 06/10/21 1701  ? ?  ? ? ? ?Assessment/Plan ?POD 1, s/p lap assisted sigmoid colectomy by Dr. Hassell Done 4/10 for sigmoid volvulus ?-doing well today ?-continue on clear liquids today and add resource breeze, may adv to full liquids later today ?-cont to mobilize/pulm toilet  ? ?FEN - CLD, Breeze ?VTE - Lovenox ?ID - none needed currently ? ? ? LOS: 4 days  ? ? ?Henreitta Cea , PA-C ?[facility] ?06/11/2021, 9:07 AM ?Please see  Amion for pager number during day hours 7:00am-4:30pm or 7:00am -11:30am on weekends ? ?

## 2021-06-12 DIAGNOSIS — K562 Volvulus: Secondary | ICD-10-CM | POA: Diagnosis not present

## 2021-06-12 LAB — CBC
HCT: 34.2 % — ABNORMAL LOW (ref 39.0–52.0)
Hemoglobin: 11.7 g/dL — ABNORMAL LOW (ref 13.0–17.0)
MCH: 30.9 pg (ref 26.0–34.0)
MCHC: 34.2 g/dL (ref 30.0–36.0)
MCV: 90.2 fL (ref 80.0–100.0)
Platelets: 209 10*3/uL (ref 150–400)
RBC: 3.79 MIL/uL — ABNORMAL LOW (ref 4.22–5.81)
RDW: 13.3 % (ref 11.5–15.5)
WBC: 8.2 10*3/uL (ref 4.0–10.5)
nRBC: 0 % (ref 0.0–0.2)

## 2021-06-12 LAB — BASIC METABOLIC PANEL
Anion gap: 6 (ref 5–15)
BUN: 10 mg/dL (ref 8–23)
CO2: 25 mmol/L (ref 22–32)
Calcium: 8.2 mg/dL — ABNORMAL LOW (ref 8.9–10.3)
Chloride: 106 mmol/L (ref 98–111)
Creatinine, Ser: 0.56 mg/dL — ABNORMAL LOW (ref 0.61–1.24)
GFR, Estimated: >60 mL/min (ref >60)
Glucose, Bld: 111 mg/dL — ABNORMAL HIGH (ref 70–99)
Potassium: 3.5 mmol/L (ref 3.5–5.1)
Sodium: 137 mmol/L (ref 135–145)

## 2021-06-12 LAB — SURGICAL PATHOLOGY

## 2021-06-12 MED ORDER — HEPARIN SOD (PORK) LOCK FLUSH 100 UNIT/ML IV SOLN
500.0000 [IU] | INTRAVENOUS | Status: AC | PRN
  Administered 2021-06-12: 500 [IU]
  Filled 2021-06-12: qty 5

## 2021-06-12 NOTE — Plan of Care (Signed)
?  Problem: Education: ?Goal: Knowledge of General Education information will improve ?Description: Including pain rating scale, medication(s)/side effects and non-pharmacologic comfort measures ?06/12/2021 1609 by Sharene Butters, RN ?Outcome: Adequate for Discharge ?06/12/2021 1609 by Sharene Butters, RN ?Outcome: Adequate for Discharge ?  ?Problem: Health Behavior/Discharge Planning: ?Goal: Ability to manage health-related needs will improve ?06/12/2021 1609 by Sharene Butters, RN ?Outcome: Adequate for Discharge ?06/12/2021 1609 by Sharene Butters, RN ?Outcome: Adequate for Discharge ?  ?Problem: Clinical Measurements: ?Goal: Ability to maintain clinical measurements within normal limits will improve ?06/12/2021 1609 by Sharene Butters, RN ?Outcome: Adequate for Discharge ?06/12/2021 1609 by Sharene Butters, RN ?Outcome: Adequate for Discharge ?Goal: Will remain free from infection ?06/12/2021 1609 by Sharene Butters, RN ?Outcome: Adequate for Discharge ?06/12/2021 1609 by Sharene Butters, RN ?Outcome: Adequate for Discharge ?Goal: Diagnostic test results will improve ?06/12/2021 1609 by Sharene Butters, RN ?Outcome: Adequate for Discharge ?06/12/2021 1609 by Sharene Butters, RN ?Outcome: Adequate for Discharge ?Goal: Respiratory complications will improve ?06/12/2021 1609 by Sharene Butters, RN ?Outcome: Adequate for Discharge ?06/12/2021 1609 by Sharene Butters, RN ?Outcome: Adequate for Discharge ?Goal: Cardiovascular complication will be avoided ?06/12/2021 1609 by Sharene Butters, RN ?Outcome: Adequate for Discharge ?06/12/2021 1609 by Sharene Butters, RN ?Outcome: Adequate for Discharge ?  ?Problem: Activity: ?Goal: Risk for activity intolerance will decrease ?06/12/2021 1609 by Sharene Butters, RN ?Outcome: Adequate for Discharge ?06/12/2021 1609 by Sharene Butters, RN ?Outcome: Adequate for Discharge ?  ?Problem: Nutrition: ?Goal: Adequate nutrition will be maintained ?06/12/2021 1609 by Sharene Butters, RN ?Outcome: Adequate for Discharge ?06/12/2021 1609 by Sharene Butters,  RN ?Outcome: Adequate for Discharge ?  ?Problem: Elimination: ?Goal: Will not experience complications related to bowel motility ?06/12/2021 1609 by Sharene Butters, RN ?Outcome: Adequate for Discharge ?06/12/2021 1609 by Sharene Butters, RN ?Outcome: Adequate for Discharge ?Goal: Will not experience complications related to urinary retention ?06/12/2021 1609 by Sharene Butters, RN ?Outcome: Adequate for Discharge ?06/12/2021 1609 by Sharene Butters, RN ?Outcome: Adequate for Discharge ?  ?Problem: Pain Managment: ?Goal: General experience of comfort will improve ?06/12/2021 1609 by Sharene Butters, RN ?Outcome: Adequate for Discharge ?06/12/2021 1609 by Sharene Butters, RN ?Outcome: Adequate for Discharge ?  ?Problem: Safety: ?Goal: Ability to remain free from injury will improve ?06/12/2021 1609 by Sharene Butters, RN ?Outcome: Adequate for Discharge ?06/12/2021 1609 by Sharene Butters, RN ?Outcome: Adequate for Discharge ?  ?Problem: Skin Integrity: ?Goal: Risk for impaired skin integrity will decrease ?06/12/2021 1609 by Sharene Butters, RN ?Outcome: Adequate for Discharge ?06/12/2021 1609 by Sharene Butters, RN ?Outcome: Adequate for Discharge ?  ?

## 2021-06-12 NOTE — Consult Note (Signed)
[facility] CM Inpatient Consult ? ? ?06/12/2021 ? ?Philip [name] ?[birth year] ?[id number] ? ?[facility] CM) ?  ?Patient chart reviewed with noted high risk score for unplanned readmission. Patient's primary provider offers embedded chronic care management team and program and is listed for transition of care. Spoke with patient bedside to provide [facility] provider follow up appointment card. Patient states that there is a PCP appointment scheduled already. No [facility] CM follow up needs at this time. ? ?Of note, [facility] Care Management services does not replace or interfere with any services that are arranged by inpatient case management or social work.  ? ?Netta Cedars, MSN, RN ?[facility] Liaison ?Phone [phone] ?Toll free office [phone] ?

## 2021-06-12 NOTE — Progress Notes (Signed)
? ? ?2 Days Post-Op  ?Subjective: ?Patient feels well today. Multiple BMs overnight. Tolerating FLD without nausea or vomiting. Mobilizing well. Would like to go home. ? ?ROS: See above, otherwise other systems negative ? ?Objective: ?Vital signs in last 24 hours: ?Temp:  [97.7 ?F (36.5 ?C)-98.5 ?F (36.9 ?C)] 97.7 ?F (36.5 ?C) (04/12 0440) ?Pulse Rate:  [70-77] 71 (04/12 0440) ?Resp:  [16-19] 18 (04/12 0440) ?BP: (133-179)/(70-91) 133/70 (04/12 0440) ?SpO2:  [99 %-100 %] 99 % (04/12 0440) ?Last BM Date : 06/11/21 ? ?Intake/Output from previous day: ?04/11 0701 - 04/12 0700 ?In: 2450.2 [P.O.:1400; I.V.:1050.2] ?Out: -  ?Intake/Output this shift: ?No intake/output data recorded. ? ?PE: ?Abd: soft, nontender, ND, incisions c/d/I with dermabond present. ? ?Lab Results:  ?Recent Labs  ?  06/11/21 ?0406 06/12/21 ?8850  ?WBC 9.7 8.2  ?HGB 12.2* 11.7*  ?HCT 35.9* 34.2*  ?PLT 205 209  ? ?BMET ?Recent Labs  ?  06/11/21 ?0406 06/12/21 ?0352  ?NA 133* 137  ?K 3.3* 3.5  ?CL 103 106  ?CO2 24 25  ?GLUCOSE 112* 111*  ?BUN 9 10  ?CREATININE 0.70 0.56*  ?CALCIUM 7.8* 8.2*  ? ?PT/INR ?No results for input(s): LABPROT, INR in the last 72 hours. ?CMP  ?   ?Component Value Date/Time  ? NA 137 06/12/2021 0352  ? NA 139 02/10/2017 1323  ? K 3.5 06/12/2021 0352  ? K 3.4 (L) 02/10/2017 1323  ? CL 106 06/12/2021 0352  ? CO2 25 06/12/2021 0352  ? CO2 23 02/10/2017 1323  ? GLUCOSE 111 (H) 06/12/2021 0352  ? GLUCOSE 117 02/10/2017 1323  ? BUN 10 06/12/2021 0352  ? BUN 13.6 02/10/2017 1323  ? CREATININE 0.56 (L) 06/12/2021 0352  ? CREATININE 0.83 05/28/2021 1320  ? CREATININE 0.9 02/10/2017 1323  ? CALCIUM 8.2 (L) 06/12/2021 0352  ? CALCIUM 9.2 02/10/2017 1323  ? PROT 7.1 06/07/2021 1008  ? PROT 6.7 02/10/2017 1323  ? ALBUMIN 4.0 06/07/2021 1008  ? ALBUMIN 3.8 02/10/2017 1323  ? AST 19 06/07/2021 1008  ? AST 16 05/28/2021 1320  ? AST 20 02/10/2017 1323  ? ALT 15 06/07/2021 1008  ? ALT 11 05/28/2021 1320  ? ALT 15 02/10/2017 1323  ? ALKPHOS 40  06/07/2021 1008  ? ALKPHOS 50 02/10/2017 1323  ? BILITOT 0.7 06/07/2021 1008  ? BILITOT 0.5 05/28/2021 1320  ? BILITOT 0.52 02/10/2017 1323  ? GFRNONAA >60 06/12/2021 0352  ? GFRNONAA >60 05/28/2021 1320  ? GFRAA >60 11/29/2019 1157  ? ?Lipase  ?   ?Component Value Date/Time  ? LIPASE 34 06/07/2021 1008  ? ? ? ? ? ?Studies/Results: ?No results found. ? ?Anti-infectives: ?Anti-infectives (From admission, onward)  ? ? Start     Dose/Rate Route Frequency Ordered Stop  ? 06/10/21 0600  cefoTEtan (CEFOTAN) 2 g in sodium chloride 0.9 % 100 mL IVPB       ? 2 g ?200 mL/hr over 30 Minutes Intravenous On call to O.R. 06/09/21 0816 06/10/21 1701  ? ?  ? ? ? ?Assessment/Plan ?POD 2, s/p lap assisted sigmoid colectomy by Dr. Hassell Done 4/10 for sigmoid volvulus ?-doing well today ?-having bowel function, tolerating liquids, advance to SOFT diet ?-cont to mobilize/pulm toilet  ? ?FEN - SOFT ?VTE - Lovenox ?ID - none needed currently ? ?Stable for discharge from [facility] perspective. Not taking narcotics so none were prescribed. Follow up with Dr. Hassell Done in 3-4 weeks. No heavy lifting, pushing, pulling. All post-op instructions discussed with the patient. ? ?  LOS: 5 days  ? ? ?Jill Alexanders , PA-C ?[facility] ?06/12/2021, 10:56 AM ?Please see Amion for pager number during day hours 7:00am-4:30pm or 7:00am -11:30am on weekends ? ?

## 2021-06-12 NOTE — Discharge Instructions (Addendum)
[facility], [place] ?[phone] ? ?OPEN ABDOMINAL SURGERY: POST OP INSTRUCTIONS ? ?Always review your discharge instruction sheet given to you by the facility where your surgery was performed. ? ?IF YOU HAVE DISABILITY OR FAMILY LEAVE FORMS, YOU MUST BRING THEM TO THE OFFICE FOR PROCESSING.  PLEASE DO NOT GIVE THEM TO YOUR DOCTOR. ? ?A prescription for pain medication may be given to you upon discharge.  Take your pain medication as prescribed, if needed.  If narcotic pain medicine is not needed, then you may take acetaminophen (Tylenol) or ibuprofen (Advil) as needed. ?Take your usually prescribed medications unless otherwise directed. ?If you need a refill on your pain medication, please contact your pharmacy. They will contact our office to request authorization.  Prescriptions will not be filled after 5pm or on week-ends. ?You should follow a light diet the first few days after arrival home, such as soup and crackers, pudding, etc.unless your doctor has advised otherwise. A high-fiber, low fat diet can be resumed as tolerated.   Be sure to include lots of fluids daily. Most patients will experience some swelling and bruising on the chest and neck area.  Ice packs will help.  Swelling and bruising can take several days to resolve ?Most patients will experience some swelling and bruising in the area of the incision. Ice pack will help. Swelling and bruising can take several days to resolve.Marland Kitchen  ?It is common to experience some constipation if taking pain medication after surgery.  Increasing fluid intake and taking a stool softener will usually help or prevent this problem from occurring.  A mild laxative (Milk of Magnesia or Miralax) should be taken according to package directions if there are no bowel movements after 48 hours. ? You may have steri-strips (small skin tapes) in place directly over the incision.  These strips should be left on the skin for 7-10 days.  If your surgeon used skin  glue on the incision, you may shower in 24 hours.  The glue will flake off over the next 2-3 weeks.  Any sutures or staples will be removed at the office during your follow-up visit. You may find that a light gauze bandage over your incision may keep your staples from being rubbed or pulled. You may shower and replace the bandage daily. ?ACTIVITIES:  You may resume regular (light) daily activities beginning the next day--such as daily self-care, walking, climbing stairs--gradually increasing activities as tolerated.  You may have sexual intercourse when it is comfortable.  Refrain from any heavy lifting or straining until approved by your doctor. ?You may drive when you no longer are taking prescription pain medication, you can comfortably wear a seatbelt, and you can safely maneuver your car and apply brakes ?Return to Work: ___________________________________ ?You should see your doctor in the office for a follow-up appointment approximately two weeks after your surgery.  Make sure that you call for this appointment within a day or two after you arrive home to insure a convenient appointment time. ?OTHER INSTRUCTIONS:  ?_____________________________________________________________ ?_____________________________________________________________ ? ?WHEN TO CALL YOUR DOCTOR: ?Fever over 101.0 ?Inability to urinate ?Nausea and/or vomiting ?Extreme swelling or bruising ?Continued bleeding from incision. ?Increased pain, redness, or drainage from the incision. ?Difficulty swallowing or breathing ?Muscle cramping or spasms. ?Numbness or tingling in hands or feet or around lips. ? ?The clinic staff is available to answer your questions during regular business hours.  Please don?t hesitate to call and ask to speak to one of  the nurses if you have concerns. ? ?For further questions, please visit [email or web address]  ? ? ? ?1) Activity restrictions and travel restrictions as per discussion with General Surgery ?2)  Follow up with General Surgery in a few weeks ?3) Follow up with PCP in 1 week ?4) Advance diet as tolerated at home ? ?

## 2021-06-12 NOTE — Discharge Summary (Signed)
?Physician Discharge Summary ?  ?Patient: Philip [name] MRN: [id number] DOB: 1945-10-04  ?Admit date:     06/07/2021  ?Discharge date: 06/12/21  ?Discharge Physician: Leslee Home  ? ?PCP: [name], MD  ? ?Recommendations at discharge:  ? ? 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 ?Hospital Course: ?76 year old Caucasian male with a past medical history of prostate cancer, hypertension, hyperlipidemia, aortic atherosclerosis, GERD, iron deficiency anemia and history of volvulus in January requiring flex sig decompression by Dr. Havery Moros.  This patient presented with recurrent sigmoid volvulus.  Gastroenterology was consulted and patient underwent flex sig decompression by Dr. Benson Norway on 4/7.  Surgery was consulted and the patient underwent lap assisted sigmoidectomy by Dr. Hassell Done on 4/10.  At rest and having multiple bowel movements now.  Tolerating full liquid diet without any nausea or vomiting.  Mobilizing well.  Cleared from a general surgery perspective for discharge.  Can advance diet as tolerated at home.  He will follow-up with Dr. Hassell Done in 3 to 4 weeks.  No heavy lifting, pushing, pulling.  All postop instructions were discussed by general surgery with the patient. ? ?Admitting diagnosis: ?Recurrent sigmoid volvulus ?Prostate cancer ?Hyperlipidemia ?GERD ?Essential hypertension ?Iron deficiency anemia ? ? ? ?Consultants: GI, general surgery ?Procedures performed: Flex sig decompression, lap assisted sigmoidectomy ?Disposition: Home ?Diet recommendation:  ? ?Soft diet and advance as tolerated ?DISCHARGE MEDICATION: ?Allergies as of 06/12/2021   ? ?   Reactions  ? Prednisone Anxiety  ? ?  ? ?  ?Medication List  ?  ? ?STOP taking these medications   ? ?polyethylene glycol 17 g packet ?Commonly known as: MIRALAX / GLYCOLAX ?  ? ?  ? ?TAKE these medications    ? ?aspirin EC 81 MG tablet ?Take 81 mg by mouth daily. Swallow whole. ?  ?atorvastatin 10 MG tablet ?Commonly known as: LIPITOR ?TAKE 1 TABLET BY MOUTH EVERY DAY ?  ?BIOTIN 5000 PO ?Take 1 tablet by mouth daily. ?  ?CALCIUM-VITAMIN D PO ?Take 1 tablet by mouth daily. ?  ?ferrous sulfate 325 (65 FE) MG EC tablet ?Take 1 tablet (325 mg total) by mouth daily with breakfast. ?  ?fexofenadine 180 MG tablet ?Commonly known as: ALLEGRA ?Take 180 mg by mouth daily. ?  ?hydrOXYzine 10 MG tablet ?Commonly known as: ATARAX ?Take 10 mg by mouth daily. ?  ?Klor-Con M20 20 MEQ tablet ?Generic drug: potassium chloride SA ?TAKE 1 TABLET BY MOUTH TWICE A DAY ?What changed: how much to take ?  ?lidocaine-prilocaine cream ?Commonly known as: EMLA ?Apply 1 application topically as needed. ?What changed: reasons to take this ?  ?LORazepam 1 MG tablet ?Commonly known as: ATIVAN ?TAKE 1 TABLET BY MOUTH EVERY 8 HOURS AS NEEDED FOR ANXIETY ?What changed:  ?reasons to take this ?additional instructions ?  ?multivitamin tablet ?Take 1 tablet by mouth daily. ?  ?olmesartan 40 MG tablet ?Commonly known as: Benicar ?Take 1 tablet (40 mg total) by mouth daily. ?  ?omeprazole 20 MG capsule ?Commonly known as: PRILOSEC ?Take 1 capsule (20 mg total) by mouth daily. Take omeprazole 20 mg everyday for 30 days, then use as needed for reflux. ?  ?XGEVA [place] ?Inject into the skin. Every 6-8 weeks. ?  ?Xtandi 40 MG capsule ?Generic drug: enzalutamide ?Take 4 capsules (160 mg total) by mouth daily. ?  ? ?  ? ? Follow-up Information   ? ?  Johnathan Hausen, MD Follow up.   ?Specialty: General Surgery ?Why: our office is scheduling you for post-operative follow up. please call to confirm appointment date/time. ?Contact information: ?[address] ?[address] ?[place] [address] ?[phone] ? ? ?  ?  ? ? [name], MD. Call in 1 week(s).   ?Specialty: Internal Medicine ?Contact information: ?[place] [address] ?[phone] ? ? ?   ?  ? ?  ?  ? ?  ? ?Discharge Exam: ?Filed Weights  ? 06/07/21 1142 06/07/21 1813 06/10/21 1047  ?Weight: 86.6 kg 84.4 kg 84.4 kg  ? ?General exam: Appears calm and comfortable ?Respiratory system: Clear to auscultation.  ?Cardiovascular system: S1 & S2 heard ?Gastrointestinal system: Abdomen is soft and nontender.  No bowel sounds.  Incisions clean dry and intact with Dermabond present ?Central nervous system: Alert and oriented.  Patient moves all extremities.   ?Extremities: No leg edema. ? ?Condition at discharge: good ? ?The results of significant diagnostics from this hospitalization (including imaging, microbiology, ancillary and laboratory) are listed below for reference.  ? ?Imaging Studies: ?DG ABD ACUTE 2+V W 1V CHEST ? ?Result Date: 06/07/2021 ?CLINICAL DATA:  Abdominal pain and nausea, history of volvulus EXAM: DG ABDOMEN ACUTE WITH 1 VIEW CHEST COMPARISON:  KUB and CT abdomen/pelvis 03/07/2021 FINDINGS: Chest: A right chest wall port is in place. The cardiomediastinal silhouette is normal. There is no focal consolidation or pulmonary edema. There is no pleural effusion or pneumothorax Abdomen: There is a loop of large bowel in the mid abdomen which is dilated measuring up to 11.8 cm with a layering fluid level. There is no evidence of free intraperitoneal air. There is no abnormal soft tissue calcification. There is no acute osseous abnormality. There is levoscoliosis of the lumbar spine. IMPRESSION: Findings above concerning for recurrent sigmoid volvulus. Recommend surgical consultation. These results were called by telephone at the time of interpretation on 06/07/2021 at 11:06 am to provider Sherwood Gambler , who verbally acknowledged these results. Electronically Signed   By: Valetta Mole M.D.   On: 06/07/2021 11:03   ? ?Microbiology: ?Results for orders placed or performed during the hospital encounter of 06/07/21  ?MRSA Next Gen by PCR, Nasal     Status: None  ? Collection Time: 06/09/21  9:54 PM  ?  Specimen: Nasal Mucosa; Nasal Swab  ?Result Value Ref Range Status  ? MRSA by PCR Next Gen NOT DETECTED NOT DETECTED Final  ?  Comment: (NOTE) ?The GeneXpert MRSA Assay (FDA approved for NASAL specimens only), ?is one component of a comprehensive MRSA colonization surveillance ?program. It is not intended to diagnose MRSA infection nor to guide ?or monitor treatment for MRSA infections. ?Test performance is not FDA approved in patients less than 2 years ?old. ?Performed at [facility], [address] ?[place], [place] [address] ?  ? ? ?Labs: ?CBC: ?Recent Labs  ?Lab 06/07/21 ?0934 06/08/21 ?3009 06/11/21 ?0406 06/12/21 ?0352  ?WBC 11.5* 6.9 9.7 8.2  ?HGB 14.4 12.8* 12.2* 11.7*  ?HCT 42.2 37.7* 35.9* 34.2*  ?MCV 91.3 91.7 90.9 90.2  ?PLT 270 238 205 209  ? ?Basic Metabolic Panel: ?Recent Labs  ?Lab 06/07/21 ?1008 06/08/21 ?2330 06/10/21 ?0762 06/11/21 ?0406 06/12/21 ?2633  ?NA 133* 133* 136 133* 137  ?K 4.0 3.8 3.5 3.3* 3.5  ?CL 103 104 107 103 106  ?CO2 21* '24 24 24 25  '$ ?GLUCOSE 119* 96 97 112* 111*  ?BUN '21 12 8 '$ 9  10  ?CREATININE 0.75 0.71 0.58* 0.70 0.56*  ?CALCIUM 8.7* 8.3* 8.1* 7.8* 8.2*  ? ?Liver Function Tests: ?Recent Labs  ?Lab 06/07/21 ?1008  ?AST 19  ?ALT 15  ?ALKPHOS 40  ?BILITOT 0.7  ?PROT 7.1  ?ALBUMIN 4.0  ? ?CBG: ?No results for input(s): GLUCAP in the last 168 hours. ? ?Discharge time spent: greater than 30 minutes. ? ?Signed: ?Leslee Home, DO ?[facility] ?06/12/2021 ?

## 2021-06-17 ENCOUNTER — Other Ambulatory Visit: Payer: Self-pay | Admitting: Oncology

## 2021-06-17 DIAGNOSIS — C61 Malignant neoplasm of prostate: Secondary | ICD-10-CM

## 2021-06-24 ENCOUNTER — Ambulatory Visit (INDEPENDENT_AMBULATORY_CARE_PROVIDER_SITE_OTHER): Payer: Medicare Other | Admitting: Internal Medicine

## 2021-06-24 ENCOUNTER — Encounter: Payer: Self-pay | Admitting: Internal Medicine

## 2021-06-24 VITALS — BP 144/82 | HR 71 | Temp 98.1°F | Ht 76.0 in | Wt 187.0 lb

## 2021-06-24 DIAGNOSIS — Z23 Encounter for immunization: Secondary | ICD-10-CM

## 2021-06-24 DIAGNOSIS — I10 Essential (primary) hypertension: Secondary | ICD-10-CM | POA: Diagnosis not present

## 2021-06-24 DIAGNOSIS — K562 Volvulus: Secondary | ICD-10-CM

## 2021-06-24 MED ORDER — SHINGRIX 50 MCG/0.5ML IM SUSR: 0.5000 mL | Freq: Once | INTRAMUSCULAR | 1 refills | Status: AC

## 2021-06-24 NOTE — Addendum Note (Signed)
Addended by: [name] on: 06/24/2021 12:56 PM ? ? Modules accepted: Level of Service ? ?

## 2021-06-24 NOTE — Patient Instructions (Signed)
Hypertension, Adult High blood pressure (hypertension) is when the force of blood pumping through the arteries is too strong. The arteries are the blood vessels that carry blood from the heart throughout the body. Hypertension forces the heart to work harder to pump blood and may cause arteries to become narrow or stiff. Untreated or uncontrolled hypertension can lead to a heart attack, heart failure, a stroke, kidney disease, and other problems. A blood pressure reading consists of a higher number over a lower number. Ideally, your blood pressure should be below 120/80. The first ("top") number is called the systolic pressure. It is a measure of the pressure in your arteries as your heart beats. The second ("bottom") number is called the diastolic pressure. It is a measure of the pressure in your arteries as the heart relaxes. What are the causes? The exact cause of this condition is not known. There are some conditions that result in high blood pressure. What increases the risk? Certain factors may make you more likely to develop high blood pressure. Some of these risk factors are under your control, including: Smoking. Not getting enough exercise or physical activity. Being overweight. Having too much fat, sugar, calories, or salt (sodium) in your diet. Drinking too much alcohol. Other risk factors include: Having a personal history of heart disease, diabetes, high cholesterol, or kidney disease. Stress. Having a family history of high blood pressure and high cholesterol. Having obstructive sleep apnea. Age. The risk increases with age. What are the signs or symptoms? High blood pressure may not cause symptoms. Very high blood pressure (hypertensive crisis) may cause: Headache. Fast or irregular heartbeats (palpitations). Shortness of breath. Nosebleed. Nausea and vomiting. Vision changes. Severe chest pain, dizziness, and seizures. How is this diagnosed? This condition is diagnosed by  measuring your blood pressure while you are seated, with your arm resting on a flat surface, your legs uncrossed, and your feet flat on the floor. The cuff of the blood pressure monitor will be placed directly against the skin of your upper arm at the level of your heart. Blood pressure should be measured at least twice using the same arm. Certain conditions can cause a difference in blood pressure between your right and left arms. If you have a high blood pressure reading during one visit or you have normal blood pressure with other risk factors, you may be asked to: Return on a different day to have your blood pressure checked again. Monitor your blood pressure at home for 1 week or longer. If you are diagnosed with hypertension, you may have other blood or imaging tests to help your health care provider understand your overall risk for other conditions. How is this treated? This condition is treated by making healthy lifestyle changes, such as eating healthy foods, exercising more, and reducing your alcohol intake. You may be referred for counseling on a healthy diet and physical activity. Your health care provider may prescribe medicine if lifestyle changes are not enough to get your blood pressure under control and if: Your systolic blood pressure is above 130. Your diastolic blood pressure is above 80. Your personal target blood pressure may vary depending on your medical conditions, your age, and other factors. Follow these instructions at home: Eating and drinking  Eat a diet that is high in fiber and potassium, and low in sodium, added sugar, and fat. An example of this eating plan is called the DASH diet. DASH stands for Dietary Approaches to Stop Hypertension. To eat this way: Eat   plenty of fresh fruits and vegetables. Try to fill one half of your plate at each meal with fruits and vegetables. Eat whole grains, such as whole-wheat pasta, brown rice, or whole-grain bread. Fill about one  fourth of your plate with whole grains. Eat or drink low-fat dairy products, such as skim milk or low-fat yogurt. Avoid fatty cuts of meat, processed or cured meats, and poultry with skin. Fill about one fourth of your plate with lean proteins, such as fish, chicken without skin, beans, eggs, or tofu. Avoid pre-made and processed foods. These tend to be higher in sodium, added sugar, and fat. Reduce your daily sodium intake. Many people with hypertension should eat less than 1,500 mg of sodium a day. Do not drink alcohol if: Your health care provider tells you not to drink. You are pregnant, may be pregnant, or are planning to become pregnant. If you drink alcohol: Limit how much you have to: 0-1 drink a day for women. 0-2 drinks a day for men. Know how much alcohol is in your drink. In the [place] one drink equals one 12 oz bottle of beer (355 mL), one 5 oz glass of wine (148 mL), or one 1 oz glass of hard liquor (44 mL). Lifestyle  Work with your health care provider to maintain a healthy body weight or to lose weight. Ask what an ideal weight is for you. Get at least 30 minutes of exercise that causes your heart to beat faster (aerobic exercise) most days of the week. Activities may include walking, swimming, or biking. Include exercise to strengthen your muscles (resistance exercise), such as Pilates or lifting weights, as part of your weekly exercise routine. Try to do these types of exercises for 30 minutes at least 3 days a week. Do not use any products that contain nicotine or tobacco. These products include cigarettes, chewing tobacco, and vaping devices, such as e-cigarettes. If you need help quitting, ask your health care provider. Monitor your blood pressure at home as told by your health care provider. Keep all follow-up visits. This is important. Medicines Take over-the-counter and prescription medicines only as told by your health care provider. Follow directions carefully. Blood  pressure medicines must be taken as prescribed. Do not skip doses of blood pressure medicine. Doing this puts you at risk for problems and can make the medicine less effective. Ask your health care provider about side effects or reactions to medicines that you should watch for. Contact a health care provider if you: Think you are having a reaction to a medicine you are taking. Have headaches that keep coming back (recurring). Feel dizzy. Have swelling in your ankles. Have trouble with your vision. Get help right away if you: Develop a severe headache or confusion. Have unusual weakness or numbness. Feel faint. Have severe pain in your chest or abdomen. Vomit repeatedly. Have trouble breathing. These symptoms may be an emergency. Get help right away. Call 911. Do not wait to see if the symptoms will go away. Do not drive yourself to the hospital. Summary Hypertension is when the force of blood pumping through your arteries is too strong. If this condition is not controlled, it may put you at risk for serious complications. Your personal target blood pressure may vary depending on your medical conditions, your age, and other factors. For most people, a normal blood pressure is less than 120/80. Hypertension is treated with lifestyle changes, medicines, or a combination of both. Lifestyle changes include losing weight, eating a healthy,   low-sodium diet, exercising more, and limiting alcohol. This information is not intended to replace advice given to you by your health care provider. Make sure you discuss any questions you have with your health care provider. Document Revised: 12/25/2020 Document Reviewed: 12/25/2020 Elsevier Patient Education  2023 Elsevier Inc.  

## 2021-06-24 NOTE — Progress Notes (Addendum)
? ?Subjective:  ?Patient ID: Philip [name], male    DOB: 1946-02-18  Age: 76 y.o. MRN: [id number] ? ?CC: Hypertension ? ? ?HPI ?Philip [name] presents for f/up - ? ?He was recently admitted for recurrence of colonic volvulus.  He underwent a surgical procedure this time and is doing well.  He has lost about 5 pounds but denies nausea, vomiting, abdominal pain, diarrhea, or constipation. ?Patient: Philip [name] MRN: [id number] DOB: 08/31/1945  ?Admit date:     06/07/2021  ?Discharge date: 06/12/21  ?Discharge Physician: Philip [name]  ?  ?PCP: Philip Lima, MD  ?  ?Recommendations at discharge:  ?  ?  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  ? ? ?Outpatient Medications Prior to Visit  ?Medication Sig Dispense Refill  ? aspirin EC 81 MG tablet Take 81 mg by mouth daily. Swallow whole.    ? atorvastatin (LIPITOR) 10 MG tablet TAKE 1 TABLET BY MOUTH EVERY DAY (Patient taking differently: Take 10 mg by mouth daily.) 90 tablet 1  ? BIOTIN 5000 PO Take 1 tablet by mouth daily.    ? CALCIUM-VITAMIN D PO Take 1 tablet by mouth daily.    ? Denosumab (XGEVA [place]) Inject into the skin. Every 6-8 weeks.    ? ferrous sulfate 325 (65 FE) MG EC tablet Take 1 tablet (325 mg total) by mouth daily with breakfast. 90 tablet 1  ? fexofenadine (ALLEGRA) 180 MG tablet Take 180 mg by mouth daily.    ? hydrOXYzine (ATARAX/VISTARIL) 10 MG tablet Take 10 mg by mouth daily.    ? KLOR-CON M20 20 MEQ tablet TAKE 1 TABLET BY MOUTH TWICE A DAY (Patient taking differently: Take 20 mEq by mouth 2 (two) times daily.) 180 tablet 0  ? lidocaine-prilocaine (EMLA) cream Apply 1 application topically as needed. (Patient taking differently: Apply 1 application. topically as needed (access port).) 30 g 0  ? LORazepam (ATIVAN) 1 MG tablet TAKE 1 TABLET BY MOUTH EVERY 8 HOURS AS NEEDED FOR ANXIETY (Patient taking  differently: Take 1 mg by mouth every 8 (eight) hours as needed for anxiety.) 90 tablet 0  ? Multiple Vitamin (MULTIVITAMIN) tablet Take 1 tablet by mouth daily.    ? olmesartan (BENICAR) 40 MG tablet Take 1 tablet (40 mg total) by mouth daily. 90 tablet 0  ? omeprazole (PRILOSEC) 20 MG capsule Take 1 capsule (20 mg total) by mouth daily. Take omeprazole 20 mg everyday for 30 days, then use as needed for reflux. 30 capsule 3  ? XTANDI 40 MG tablet Take 4 tablets ('160mg'$ ) by mouth once daily as directed by physician. 120 tablet 0  ? ?No facility-administered medications prior to visit.  ? ? ?ROS ?Review of Systems  ?Constitutional:  Positive for unexpected weight change (wt loss). Negative for chills, diaphoresis and fatigue.  ?HENT: Negative.    ?Eyes: Negative.   ?Respiratory:  Negative for cough, chest tightness, shortness of breath and wheezing.   ?Cardiovascular:  Negative for chest pain, palpitations and leg swelling.  ?Gastrointestinal:  Negative for abdominal pain, constipation, diarrhea, nausea and vomiting.  ?Endocrine: Negative.   ?Genitourinary: Negative.  Negative for difficulty urinating.  ?Musculoskeletal:  Negative for arthralgias and myalgias.  ?Skin: Negative.  Negative for rash.  ?Neurological: Negative.  Negative for dizziness, weakness, light-headedness and headaches.  ?Hematological:  Negative for adenopathy. Does not bruise/bleed easily.  ?Psychiatric/Behavioral: Negative.    ? ?  Objective:  ?BP (!) 144/82 (BP Location: Left Arm, Patient Position: Sitting, Cuff Size: Large)   Pulse 71   Temp 98.1 ?F (36.7 ?C) (Oral)   Ht '6\' 4"'$  (1.93 m)   Wt 187 lb (84.8 kg)   SpO2 94%   BMI 22.76 kg/m?  ? ?BP Readings from Last 3 Encounters:  ?06/24/21 (!) 144/82  ?06/12/21 133/70  ?05/30/21 (!) 154/90  ? ? ?Wt Readings from Last 3 Encounters:  ?06/24/21 187 lb (84.8 kg)  ?06/10/21 186 lb 1.1 oz (84.4 kg)  ?05/30/21 191 lb 14.4 oz (87 kg)  ? ? ?Physical Exam ?Vitals reviewed.  ?Constitutional:   ?    Appearance: He is not ill-appearing.  ?HENT:  ?   Nose: Nose normal.  ?   Mouth/Throat:  ?   Mouth: Mucous membranes are moist.  ?Eyes:  ?   General: No scleral icterus. ?   Conjunctiva/sclera: Conjunctivae normal.  ?Cardiovascular:  ?   Rate and Rhythm: Normal rate and regular rhythm.  ?   Heart sounds: No murmur heard. ?Pulmonary:  ?   Effort: Pulmonary effort is normal.  ?   Breath sounds: No stridor. No wheezing, rhonchi or rales.  ?Abdominal:  ?   General: Abdomen is flat.  ?   Palpations: There is no mass.  ?   Tenderness: There is no abdominal tenderness. There is no guarding.  ?   Hernia: No hernia is present.  ?Musculoskeletal:     ?   General: Normal range of motion.  ?   Cervical back: Neck supple.  ?   Right lower leg: No edema.  ?   Left lower leg: No edema.  ?Lymphadenopathy:  ?   Cervical: No cervical adenopathy.  ?Skin: ?   General: Skin is warm and dry.  ?Neurological:  ?   General: No focal deficit present.  ?   Mental Status: He is alert.  ?Psychiatric:     ?   Mood and Affect: Mood normal.     ?   Behavior: Behavior normal.  ? ? ?Lab Results  ?Component Value Date  ? WBC 8.2 06/12/2021  ? HGB 11.7 (L) 06/12/2021  ? HCT 34.2 (L) 06/12/2021  ? PLT 209 06/12/2021  ? GLUCOSE 111 (H) 06/12/2021  ? CHOL 181 05/09/2020  ? TRIG 220.0 (H) 05/09/2020  ? HDL 72.70 05/09/2020  ? LDLDIRECT 75.0 05/09/2020  ? ALT 15 06/07/2021  ? AST 19 06/07/2021  ? NA 137 06/12/2021  ? K 3.5 06/12/2021  ? CL 106 06/12/2021  ? CREATININE 0.56 (L) 06/12/2021  ? BUN 10 06/12/2021  ? CO2 25 06/12/2021  ? TSH 1.87 03/07/2021  ? PSA 0.02 (L) 03/26/2021  ? INR 1.06 02/09/2015  ? ? ?DG ABD ACUTE 2+V W 1V CHEST ? ?Result Date: 06/07/2021 ?CLINICAL DATA:  Abdominal pain and nausea, history of volvulus EXAM: DG ABDOMEN ACUTE WITH 1 VIEW CHEST COMPARISON:  KUB and CT abdomen/pelvis 03/07/2021 FINDINGS: Chest: A right chest wall port is in place. The cardiomediastinal silhouette is normal. There is no focal consolidation or pulmonary  edema. There is no pleural effusion or pneumothorax Abdomen: There is a loop of large bowel in the mid abdomen which is dilated measuring up to 11.8 cm with a layering fluid level. There is no evidence of free intraperitoneal air. There is no abnormal soft tissue calcification. There is no acute osseous abnormality. There is levoscoliosis of the lumbar spine. IMPRESSION: Findings above concerning for recurrent sigmoid volvulus. Recommend surgical  consultation. These results were called by telephone at the time of interpretation on 06/07/2021 at 11:06 am to provider Sherwood Gambler , who verbally acknowledged these results. Electronically Signed   By: Valetta Mole M.D.   On: 06/07/2021 11:03  ? ? ?Assessment & Plan:  ? ?Lucious was seen today for hypertension. ? ?Diagnoses and all orders for this visit: ? ?Essential hypertension- His blood pressure is adequately well controlled. ? ?Sigmoid volvulus ([place])- The surgical procedure was successful. ? ? ?I am having [employer] start on Shingrix. I am also having him maintain his fexofenadine, BIOTIN 5000 PO, Denosumab (XGEVA [place]), multivitamin, hydrOXYzine, lidocaine-prilocaine, atorvastatin, LORazepam, CALCIUM-VITAMIN D PO, olmesartan, aspirin EC, Klor-Con M20, omeprazole, ferrous sulfate, and Xtandi. ? ?Meds ordered this encounter  ?Medications  ? Zoster Vaccine Adjuvanted [facility]) injection  ?  Sig: Inject 0.5 mLs into the muscle once for 1 dose.  ?  Dispense:  0.5 mL  ?  Refill:  1  ? ? ? ?Follow-up: Return in about 6 months (around 12/24/2021). ? ?Scarlette Calico, MD ?

## 2021-07-07 ENCOUNTER — Other Ambulatory Visit: Payer: Self-pay | Admitting: Internal Medicine

## 2021-07-07 DIAGNOSIS — E876 Hypokalemia: Secondary | ICD-10-CM

## 2021-07-07 DIAGNOSIS — I10 Essential (primary) hypertension: Secondary | ICD-10-CM

## 2021-07-10 ENCOUNTER — Inpatient Hospital Stay: Payer: Medicare Other | Attending: Oncology

## 2021-07-10 DIAGNOSIS — C61 Malignant neoplasm of prostate: Secondary | ICD-10-CM | POA: Insufficient documentation

## 2021-07-10 DIAGNOSIS — C7951 Secondary malignant neoplasm of bone: Secondary | ICD-10-CM | POA: Insufficient documentation

## 2021-07-10 DIAGNOSIS — Z95828 Presence of other vascular implants and grafts: Secondary | ICD-10-CM

## 2021-07-10 LAB — CBC WITH DIFFERENTIAL (CANCER CENTER ONLY)
Abs Immature Granulocytes: 0.02 10*3/uL (ref 0.00–0.07)
Basophils Absolute: 0 10*3/uL (ref 0.0–0.1)
Basophils Relative: 0 %
Eosinophils Absolute: 0.1 10*3/uL (ref 0.0–0.5)
Eosinophils Relative: 2 %
HCT: 35.8 % — ABNORMAL LOW (ref 39.0–52.0)
Hemoglobin: 12.5 g/dL — ABNORMAL LOW (ref 13.0–17.0)
Immature Granulocytes: 0 %
Lymphocytes Relative: 26 %
Lymphs Abs: 1.6 10*3/uL (ref 0.7–4.0)
MCH: 31.3 pg (ref 26.0–34.0)
MCHC: 34.9 g/dL (ref 30.0–36.0)
MCV: 89.7 fL (ref 80.0–100.0)
Monocytes Absolute: 0.7 10*3/uL (ref 0.1–1.0)
Monocytes Relative: 12 %
Neutro Abs: 3.5 10*3/uL (ref 1.7–7.7)
Neutrophils Relative %: 60 %
Platelet Count: 206 10*3/uL (ref 150–400)
RBC: 3.99 MIL/uL — ABNORMAL LOW (ref 4.22–5.81)
RDW: 14.4 % (ref 11.5–15.5)
WBC Count: 6 10*3/uL (ref 4.0–10.5)
nRBC: 0 % (ref 0.0–0.2)

## 2021-07-10 LAB — CMP (CANCER CENTER ONLY)
ALT: 18 U/L (ref 0–44)
AST: 22 U/L (ref 15–41)
Albumin: 3.7 g/dL (ref 3.5–5.0)
Alkaline Phosphatase: 51 U/L (ref 38–126)
Anion gap: 6 (ref 5–15)
BUN: 22 mg/dL (ref 8–23)
CO2: 25 mmol/L (ref 22–32)
Calcium: 9.1 mg/dL (ref 8.9–10.3)
Chloride: 102 mmol/L (ref 98–111)
Creatinine: 0.92 mg/dL (ref 0.61–1.24)
GFR, Estimated: >60 mL/min (ref >60)
Glucose, Bld: 87 mg/dL (ref 70–99)
Potassium: 4.4 mmol/L (ref 3.5–5.1)
Sodium: 133 mmol/L — ABNORMAL LOW (ref 135–145)
Total Bilirubin: 0.5 mg/dL (ref 0.3–1.2)
Total Protein: 7 g/dL (ref 6.5–8.1)

## 2021-07-10 MED ORDER — HEPARIN SOD (PORK) LOCK FLUSH 100 UNIT/ML IV SOLN
500.0000 [IU] | Freq: Once | INTRAVENOUS | Status: AC
  Administered 2021-07-10: 500 [IU]

## 2021-07-10 MED ORDER — SODIUM CHLORIDE 0.9% FLUSH
10.0000 mL | Freq: Once | INTRAVENOUS | Status: AC
  Administered 2021-07-10: 10 mL

## 2021-07-11 LAB — PROSTATE-SPECIFIC AG, SERUM (LABCORP): Prostate Specific Ag, Serum: <0.1 ng/mL (ref 0.0–4.0)

## 2021-07-12 ENCOUNTER — Inpatient Hospital Stay: Payer: Medicare Other

## 2021-07-12 ENCOUNTER — Other Ambulatory Visit: Payer: Self-pay

## 2021-07-12 ENCOUNTER — Inpatient Hospital Stay (HOSPITAL_BASED_OUTPATIENT_CLINIC_OR_DEPARTMENT_OTHER): Payer: Medicare Other | Admitting: Oncology

## 2021-07-12 VITALS — BP 139/73 | HR 82 | Temp 97.9°F | Resp 16 | Ht 76.0 in | Wt 190.1 lb

## 2021-07-12 DIAGNOSIS — Z95828 Presence of other vascular implants and grafts: Secondary | ICD-10-CM

## 2021-07-12 DIAGNOSIS — C7951 Secondary malignant neoplasm of bone: Secondary | ICD-10-CM | POA: Diagnosis not present

## 2021-07-12 DIAGNOSIS — C61 Malignant neoplasm of prostate: Secondary | ICD-10-CM

## 2021-07-12 MED ORDER — DENOSUMAB 120 MG/1.7ML ~~LOC~~ SOLN
120.0000 mg | Freq: Once | SUBCUTANEOUS | Status: AC
  Administered 2021-07-12: 120 mg via SUBCUTANEOUS
  Filled 2021-07-12: qty 1.7

## 2021-07-12 NOTE — Patient Instructions (Signed)
Denosumab injection ?What is this medication? ?DENOSUMAB (den oh sue mab) slows bone breakdown. Prolia is used to treat osteoporosis in women after menopause and in men, and in people who are taking corticosteroids for 6 months or more. Xgeva is used to treat a high calcium level due to cancer and to prevent bone fractures and other bone problems caused by multiple myeloma or cancer bone metastases. Xgeva is also used to treat giant cell tumor of the bone. ?This medicine may be used for other purposes; ask your health care provider or pharmacist if you have questions. ?COMMON BRAND NAME(S): Prolia, XGEVA ?What should I tell my care team before I take this medication? ?They need to know if you have any of these conditions: ?dental disease ?having surgery or tooth extraction ?infection ?kidney disease ?low levels of calcium or Vitamin D in the blood ?malnutrition ?on hemodialysis ?skin conditions or sensitivity ?thyroid or parathyroid disease ?an unusual reaction to denosumab, other medicines, foods, dyes, or preservatives ?pregnant or trying to get pregnant ?breast-feeding ?How should I use this medication? ?This medicine is for injection under the skin. It is given by a health care professional in a hospital or clinic setting. ?A special MedGuide will be given to you before each treatment. Be sure to read this information carefully each time. ?For Prolia, talk to your pediatrician regarding the use of this medicine in children. Special care may be needed. For Xgeva, talk to your pediatrician regarding the use of this medicine in children. While this drug may be prescribed for children as young as 13 years for selected conditions, precautions do apply. ?Overdosage: If you think you have taken too much of this medicine contact a poison control center or emergency room at once. ?NOTE: This medicine is only for you. Do not share this medicine with others. ?What if I miss a dose? ?It is important not to miss your dose.  Call your doctor or health care professional if you are unable to keep an appointment. ?What may interact with this medication? ?Do not take this medicine with any of the following medications: ?other medicines containing denosumab ?This medicine may also interact with the following medications: ?medicines that lower your chance of fighting infection ?steroid medicines like prednisone or cortisone ?This list may not describe all possible interactions. Give your health care provider a list of all the medicines, herbs, non-prescription drugs, or dietary supplements you use. Also tell them if you smoke, drink alcohol, or use illegal drugs. Some items may interact with your medicine. ?What should I watch for while using this medication? ?Visit your doctor or health care professional for regular checks on your progress. Your doctor or health care professional may order blood tests and other tests to see how you are doing. ?Call your doctor or health care professional for advice if you get a fever, chills or sore throat, or other symptoms of a cold or flu. Do not treat yourself. This drug may decrease your body's ability to fight infection. Try to avoid being around people who are sick. ?You should make sure you get enough calcium and vitamin D while you are taking this medicine, unless your doctor tells you not to. Discuss the foods you eat and the vitamins you take with your health care professional. ?See your dentist regularly. Brush and floss your teeth as directed. Before you have any dental work done, tell your dentist you are receiving this medicine. ?Do not become pregnant while taking this medicine or for 5 months after   stopping it. Talk with your doctor or health care professional about your birth control options while taking this medicine. Women should inform their doctor if they wish to become pregnant or think they might be pregnant. There is a potential for serious side effects to an unborn child. Talk to  your health care professional or pharmacist for more information. ?What side effects may I notice from receiving this medication? ?Side effects that you should report to your doctor or health care professional as soon as possible: ?allergic reactions like skin rash, itching or hives, swelling of the face, lips, or tongue ?bone pain ?breathing problems ?dizziness ?jaw pain, especially after dental work ?redness, blistering, peeling of the skin ?signs and symptoms of infection like fever or chills; cough; sore throat; pain or trouble passing urine ?signs of low calcium like fast heartbeat, muscle cramps or muscle pain; pain, tingling, numbness in the hands or feet; seizures ?unusual bleeding or bruising ?unusually weak or tired ?Side effects that usually do not require medical attention (report to your doctor or health care professional if they continue or are bothersome): ?constipation ?diarrhea ?headache ?joint pain ?loss of appetite ?muscle pain ?runny nose ?tiredness ?upset stomach ?This list may not describe all possible side effects. Call your doctor for medical advice about side effects. You may report side effects to FDA at 1-800-FDA-1088. ?Where should I keep my medication? ?This medicine is only given in a clinic, doctor's office, or other health care setting and will not be stored at home. ?NOTE: This sheet is a summary. It may not cover all possible information. If you have questions about this medicine, talk to your doctor, pharmacist, or health care provider. ?? 2023 Elsevier/Gold Standard (2017-06-26 00:00:00) ? ?

## 2021-07-12 NOTE — Progress Notes (Signed)
Hematology and Oncology Follow Up Visit ? ?Percival Glasheen ?[id number] ?[birth year] 76 y.o. ?07/12/2021 2:29 PM ?[name], MDJones, [name], MD  ? 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  He presented with PSA of 2900 and advanced disease at that time. ? ? ?Prior Therapy: ?Status post orchiectomy done on November 2016. ? ?Taxotere chemotherapy at 75 mg/m? to start on 02/16/2015. He is completed 6 cycles of therapy in March 2017. ? ?Zytiga 1000 mg daily started in July 2017.  Therapy discontinued in 2023 due to insurance purposes. ? ?Current therapy: Xtandi 160 mg daily started in February 2023. ? ?Interim History:  Mr. Kutch returns today for a follow-up visit.  Since the last visit, he underwent laparoscopic assisted sigmoid colectomy was completed on April 10 after he developed recurrent sigmoid volvulus.  The final pathology did not show any evidence of malignancy.  Since his operation ? ? ?Medications: Reviewed without changes. ?Current Outpatient Medications  ?Medication Sig Dispense Refill  ? aspirin EC 81 MG tablet Take 81 mg by mouth daily. Swallow whole.    ? atorvastatin (LIPITOR) 10 MG tablet TAKE 1 TABLET BY MOUTH EVERY DAY (Patient taking differently: Take 10 mg by mouth daily.) 90 tablet 1  ? BIOTIN 5000 PO Take 1 tablet by mouth daily.    ? CALCIUM-VITAMIN D PO Take 1 tablet by mouth daily.    ? Denosumab (XGEVA [place]) Inject into the skin. Every 6-8 weeks.    ? ferrous sulfate 325 (65 FE) MG EC tablet Take 1 tablet (325 mg total) by mouth daily with breakfast. 90 tablet 1  ? fexofenadine (ALLEGRA) 180 MG tablet Take 180 mg by mouth daily.    ? hydrOXYzine (ATARAX/VISTARIL) 10 MG tablet Take 10 mg by mouth daily.    ? KLOR-CON M20 20 MEQ tablet TAKE 1 TABLET BY MOUTH TWICE A DAY 180 tablet 0  ? lidocaine-prilocaine (EMLA) cream Apply 1 application topically as needed. (Patient taking  differently: Apply 1 application. topically as needed (access port).) 30 g 0  ? LORazepam (ATIVAN) 1 MG tablet TAKE 1 TABLET BY MOUTH EVERY 8 HOURS AS NEEDED FOR ANXIETY (Patient taking differently: Take 1 mg by mouth every 8 (eight) hours as needed for anxiety.) 90 tablet 0  ? Multiple Vitamin (MULTIVITAMIN) tablet Take 1 tablet by mouth daily.    ? olmesartan (BENICAR) 40 MG tablet Take 1 tablet (40 mg total) by mouth daily. 90 tablet 0  ? omeprazole (PRILOSEC) 20 MG capsule Take 1 capsule (20 mg total) by mouth daily. Take omeprazole 20 mg everyday for 30 days, then use as needed for reflux. 30 capsule 3  ? XTANDI 40 MG tablet Take 4 tablets ('160mg'$ ) by mouth once daily as directed by physician. 120 tablet 0  ? ?No current facility-administered medications for this visit.  ? ? ? ?Allergies:  ?Allergies  ?Allergen Reactions  ? Prednisone Anxiety  ? ? ? ? ?Physical Exam: ? ? ? ? ? ? ? ? ? ?ECOG: 0 ?  ? ? ?General appearance: Comfortable appearing without any discomfort ?Head: Normocephalic without any trauma ?Oropharynx: Mucous membranes are moist and pink without any thrush or ulcers. ?Eyes: Pupils are equal and round reactive to light. ?Lymph nodes: No cervical, supraclavicular, inguinal or axillary lymphadenopathy.   ?Heart:regular rate and rhythm.  S1 and S2 without leg edema. ?Lung: Clear without any rhonchi or wheezes.  No dullness to percussion. ?Abdomin:  Soft, nontender, nondistended with good bowel sounds.  No hepatosplenomegaly. ?Musculoskeletal: No joint deformity or effusion.  Full range of motion noted. ?Neurological: No deficits noted on motor, sensory and deep tendon reflex exam. ?Skin: No petechial rash or dryness.  Appeared moist.  ? ? ? ? ? ? ? ? ? ? ? ? ? ? ? ? ? ? ? ? ? ? ? ? ?Lab Results: ?Lab Results  ?Component Value Date  ? WBC 6.0 07/10/2021  ? HGB 12.5 (L) 07/10/2021  ? HCT 35.8 (L) 07/10/2021  ? MCV 89.7 07/10/2021  ? PLT 206 07/10/2021  ? ?  Chemistry   ?   ?Component Value Date/Time  ?  NA 133 (L) 07/10/2021 1334  ? NA 139 02/10/2017 1323  ? K 4.4 07/10/2021 1334  ? K 3.4 (L) 02/10/2017 1323  ? CL 102 07/10/2021 1334  ? CO2 25 07/10/2021 1334  ? CO2 23 02/10/2017 1323  ? BUN 22 07/10/2021 1334  ? BUN 13.6 02/10/2017 1323  ? CREATININE 0.92 07/10/2021 1334  ? CREATININE 0.9 02/10/2017 1323  ?    ?Component Value Date/Time  ? CALCIUM 9.1 07/10/2021 1334  ? CALCIUM 9.2 02/10/2017 1323  ? ALKPHOS 51 07/10/2021 1334  ? ALKPHOS 50 02/10/2017 1323  ? AST 22 07/10/2021 1334  ? AST 20 02/10/2017 1323  ? ALT 18 07/10/2021 1334  ? ALT 15 02/10/2017 1323  ? BILITOT 0.5 07/10/2021 1334  ? BILITOT 0.52 02/10/2017 1323  ?  ? ? ? ? ? ? ? ? ? ? Latest Reference Range & Units 04/09/21 13:53 05/28/21 13:20 07/10/21 13:34  ?Prostate Specific Ag, Serum 0.0 - 4.0 ng/mL <0.1 <0.1 <0.1  ? ? ?Impression and Plan: ? ?76 year old man with: ? ?1.  Castration-resistant advanced prostate cancer with disease to the bone diagnosed in 2016.  ? ?He is currently on Xtandi and is tolerated very well without any major complaints.  His PSA continues to be undetectable without any evidence of relapsed disease.  Long-term complication associated with this treatment including hypertension and fatigue were reiterated. ? ? ?2. IV access: Port-A-Cath continues to be flushed periodically. ? ?3. Hypertension:  ? ?4.  Androgen depravation: No need for additional therapy at this time.  He is status post orchiectomy. ? ?5.  Bone directed therapy: He has tolerated Xgeva well.  Complication occluding osteonecrosis of the jaw and hypocalcemia were reviewed.  He is agreeable to proceed. ? ?6.  Anemia: His hemoglobin is adequate at this time after surgery. ? ?7.  Sigmoid volvulus: He is status post sigmoid colectomy which was completed on April 10 without any issues. ? ?8. Follow-up: In 2 months for repeat follow-up. ? ?30 minutes were dedicated to this visit.  The time was spent on reviewing laboratory data, disease status update, pathology reports  and future plan of care discussion. ? ?Zola Button, MD ?5/12/20232:29 PM ?

## 2021-07-18 ENCOUNTER — Other Ambulatory Visit: Payer: Self-pay | Admitting: Gastroenterology

## 2021-07-20 ENCOUNTER — Other Ambulatory Visit: Payer: Self-pay | Admitting: Internal Medicine

## 2021-07-20 DIAGNOSIS — I10 Essential (primary) hypertension: Secondary | ICD-10-CM

## 2021-07-22 ENCOUNTER — Other Ambulatory Visit: Payer: Self-pay | Admitting: Internal Medicine

## 2021-07-22 DIAGNOSIS — I10 Essential (primary) hypertension: Secondary | ICD-10-CM

## 2021-07-31 ENCOUNTER — Encounter: Payer: Self-pay | Admitting: Oncology

## 2021-07-31 ENCOUNTER — Other Ambulatory Visit: Payer: Self-pay | Admitting: *Deleted

## 2021-07-31 DIAGNOSIS — C61 Malignant neoplasm of prostate: Secondary | ICD-10-CM

## 2021-07-31 MED ORDER — ENZALUTAMIDE 40 MG PO TABS: ORAL_TABLET | ORAL | 12 refills | Status: DC

## 2021-08-08 DIAGNOSIS — N1330 Unspecified hydronephrosis: Secondary | ICD-10-CM | POA: Diagnosis not present

## 2021-08-08 DIAGNOSIS — C775 Secondary and unspecified malignant neoplasm of intrapelvic lymph nodes: Secondary | ICD-10-CM | POA: Diagnosis not present

## 2021-08-08 DIAGNOSIS — C7951 Secondary malignant neoplasm of bone: Secondary | ICD-10-CM | POA: Diagnosis not present

## 2021-08-08 DIAGNOSIS — C61 Malignant neoplasm of prostate: Secondary | ICD-10-CM | POA: Diagnosis not present

## 2021-08-10 ENCOUNTER — Other Ambulatory Visit: Payer: Self-pay | Admitting: Internal Medicine

## 2021-08-10 DIAGNOSIS — E785 Hyperlipidemia, unspecified: Secondary | ICD-10-CM

## 2021-08-10 DIAGNOSIS — I700 Atherosclerosis of aorta: Secondary | ICD-10-CM

## 2021-08-12 ENCOUNTER — Encounter: Payer: Self-pay | Admitting: Oncology

## 2021-08-14 ENCOUNTER — Other Ambulatory Visit: Payer: Self-pay | Admitting: Oncology

## 2021-08-14 DIAGNOSIS — C61 Malignant neoplasm of prostate: Secondary | ICD-10-CM

## 2021-09-10 ENCOUNTER — Inpatient Hospital Stay: Payer: Medicare Other | Attending: Oncology

## 2021-09-10 ENCOUNTER — Other Ambulatory Visit: Payer: Self-pay

## 2021-09-10 DIAGNOSIS — K562 Volvulus: Secondary | ICD-10-CM | POA: Diagnosis not present

## 2021-09-10 DIAGNOSIS — R5383 Other fatigue: Secondary | ICD-10-CM | POA: Diagnosis not present

## 2021-09-10 DIAGNOSIS — D649 Anemia, unspecified: Secondary | ICD-10-CM | POA: Diagnosis not present

## 2021-09-10 DIAGNOSIS — C7951 Secondary malignant neoplasm of bone: Secondary | ICD-10-CM | POA: Insufficient documentation

## 2021-09-10 DIAGNOSIS — C61 Malignant neoplasm of prostate: Secondary | ICD-10-CM | POA: Diagnosis not present

## 2021-09-10 DIAGNOSIS — I10 Essential (primary) hypertension: Secondary | ICD-10-CM | POA: Insufficient documentation

## 2021-09-10 DIAGNOSIS — Z95828 Presence of other vascular implants and grafts: Secondary | ICD-10-CM

## 2021-09-10 DIAGNOSIS — F411 Generalized anxiety disorder: Secondary | ICD-10-CM | POA: Insufficient documentation

## 2021-09-10 LAB — CBC WITH DIFFERENTIAL (CANCER CENTER ONLY)
Abs Immature Granulocytes: 0.03 10*3/uL (ref 0.00–0.07)
Basophils Absolute: 0 10*3/uL (ref 0.0–0.1)
Basophils Relative: 1 %
Eosinophils Absolute: 0.1 10*3/uL (ref 0.0–0.5)
Eosinophils Relative: 1 %
HCT: 35.2 % — ABNORMAL LOW (ref 39.0–52.0)
Hemoglobin: 11.8 g/dL — ABNORMAL LOW (ref 13.0–17.0)
Immature Granulocytes: 1 %
Lymphocytes Relative: 28 %
Lymphs Abs: 1.8 10*3/uL (ref 0.7–4.0)
MCH: 31.4 pg (ref 26.0–34.0)
MCHC: 33.5 g/dL (ref 30.0–36.0)
MCV: 93.6 fL (ref 80.0–100.0)
Monocytes Absolute: 0.6 10*3/uL (ref 0.1–1.0)
Monocytes Relative: 10 %
Neutro Abs: 3.8 10*3/uL (ref 1.7–7.7)
Neutrophils Relative %: 59 %
Platelet Count: 251 10*3/uL (ref 150–400)
RBC: 3.76 MIL/uL — ABNORMAL LOW (ref 4.22–5.81)
RDW: 14.1 % (ref 11.5–15.5)
WBC Count: 6.3 10*3/uL (ref 4.0–10.5)
nRBC: 0 % (ref 0.0–0.2)

## 2021-09-10 LAB — CMP (CANCER CENTER ONLY)
ALT: 10 U/L (ref 0–44)
AST: 16 U/L (ref 15–41)
Albumin: 4.1 g/dL (ref 3.5–5.0)
Alkaline Phosphatase: 48 U/L (ref 38–126)
Anion gap: 6 (ref 5–15)
BUN: 21 mg/dL (ref 8–23)
CO2: 27 mmol/L (ref 22–32)
Calcium: 9.5 mg/dL (ref 8.9–10.3)
Chloride: 102 mmol/L (ref 98–111)
Creatinine: 1.05 mg/dL (ref 0.61–1.24)
GFR, Estimated: >60 mL/min (ref >60)
Glucose, Bld: 111 mg/dL — ABNORMAL HIGH (ref 70–99)
Potassium: 4.4 mmol/L (ref 3.5–5.1)
Sodium: 135 mmol/L (ref 135–145)
Total Bilirubin: 0.4 mg/dL (ref 0.3–1.2)
Total Protein: 6.7 g/dL (ref 6.5–8.1)

## 2021-09-10 MED ORDER — SODIUM CHLORIDE 0.9% FLUSH
10.0000 mL | Freq: Once | INTRAVENOUS | Status: AC
  Administered 2021-09-10: 10 mL

## 2021-09-10 MED ORDER — HEPARIN SOD (PORK) LOCK FLUSH 100 UNIT/ML IV SOLN
500.0000 [IU] | Freq: Once | INTRAVENOUS | Status: AC
  Administered 2021-09-10: 500 [IU]

## 2021-09-11 ENCOUNTER — Encounter: Payer: Self-pay | Admitting: Oncology

## 2021-09-11 LAB — PROSTATE-SPECIFIC AG, SERUM (LABCORP): Prostate Specific Ag, Serum: <0.1 ng/mL (ref 0.0–4.0)

## 2021-09-12 ENCOUNTER — Ambulatory Visit: Payer: Medicare Other

## 2021-09-12 ENCOUNTER — Inpatient Hospital Stay (HOSPITAL_BASED_OUTPATIENT_CLINIC_OR_DEPARTMENT_OTHER): Payer: Medicare Other | Admitting: Oncology

## 2021-09-12 ENCOUNTER — Other Ambulatory Visit: Payer: Self-pay

## 2021-09-12 VITALS — BP 132/81 | HR 86 | Temp 97.8°F | Resp 16 | Ht 76.0 in | Wt 190.5 lb

## 2021-09-12 DIAGNOSIS — D649 Anemia, unspecified: Secondary | ICD-10-CM | POA: Diagnosis not present

## 2021-09-12 DIAGNOSIS — K562 Volvulus: Secondary | ICD-10-CM | POA: Diagnosis not present

## 2021-09-12 DIAGNOSIS — F39 Unspecified mood [affective] disorder: Secondary | ICD-10-CM

## 2021-09-12 DIAGNOSIS — F419 Anxiety disorder, unspecified: Secondary | ICD-10-CM | POA: Diagnosis not present

## 2021-09-12 DIAGNOSIS — C7951 Secondary malignant neoplasm of bone: Secondary | ICD-10-CM | POA: Diagnosis not present

## 2021-09-12 DIAGNOSIS — R5383 Other fatigue: Secondary | ICD-10-CM | POA: Diagnosis not present

## 2021-09-12 DIAGNOSIS — C61 Malignant neoplasm of prostate: Secondary | ICD-10-CM | POA: Diagnosis not present

## 2021-09-12 DIAGNOSIS — I10 Essential (primary) hypertension: Secondary | ICD-10-CM | POA: Diagnosis not present
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
## 2021-09-13 ENCOUNTER — Encounter: Payer: Self-pay | Admitting: Oncology

## 2021-09-23 ENCOUNTER — Encounter: Payer: Self-pay | Admitting: Oncology

## 2021-10-01 ENCOUNTER — Other Ambulatory Visit: Payer: Self-pay | Admitting: Oncology

## 2021-10-01 DIAGNOSIS — C61 Malignant neoplasm of prostate: Secondary | ICD-10-CM

## 2021-10-03 ENCOUNTER — Other Ambulatory Visit: Payer: Self-pay | Admitting: Internal Medicine

## 2021-10-03 DIAGNOSIS — E876 Hypokalemia: Secondary | ICD-10-CM

## 2021-10-03 DIAGNOSIS — I10 Essential (primary) hypertension: Secondary | ICD-10-CM

## 2021-10-07 ENCOUNTER — Telehealth: Payer: Self-pay

## 2021-10-07 NOTE — Telephone Encounter (Signed)
Pt had labs on 09/10/21. Would you like to repeat at this time? Any recommendations? Thanks

## 2021-10-07 NOTE — Telephone Encounter (Signed)
-----   Message from Yevette Edwards, RN sent at 06/05/2021  4:12 PM EDT ----- Regarding: Labs CBC/diff - need to enter order

## 2021-10-07 NOTE — Telephone Encounter (Signed)
Thanks [place], Dr. Alen Blew is monitoring for now, he recently saw him and he will recheck his iron levels at their next visit. I think Dr. Alen Blew can follow this for now. He can follow up with me if iron deficiency recurs. Thanks

## 2021-10-08 NOTE — Telephone Encounter (Signed)
Noted, thanks!

## 2021-10-12 ENCOUNTER — Other Ambulatory Visit: Payer: Self-pay | Admitting: Gastroenterology

## 2021-10-14 ENCOUNTER — Ambulatory Visit (INDEPENDENT_AMBULATORY_CARE_PROVIDER_SITE_OTHER): Payer: Medicare Other | Admitting: Psychologist

## 2021-10-14 DIAGNOSIS — F411 Generalized anxiety disorder: Secondary | ICD-10-CM

## 2021-10-14 NOTE — Plan of Care (Signed)
Goals ?Reduce overall frequency, intensity, and duration of anxiety ?Stabilize anxiety level wile increasing ability to function ?Enhance ability to effectively cope with full variety of stressors ?Learn and implement coping skills that result in a reduction of anxiety  ? ?Objectives ?Verbalize an understanding of the cognitive, physiological, and behavioral components of anxiety ?Learning and implement calming skills to reduce overall anxiety ?Verbalize an understanding of the role that cognitive biases play in excessive irrational worry and persistent anxiety symptoms ?Identify, challenge, and replace based fearful talk ?Learn and implement problem solving strategies ?Identify and engage in pleasant activities ?Learning and implement personal and interpersonal skills to reduce anxiety and improve interpersonal relationships ?Learn to accept limitations in life and commit to tolerating, rather than avoiding, unpleasant emotions while accomplishing meaningful goals ?Identify major life conflicts from the past and present that form the basis for present anxiety ?Maintain involvement in work, family, and social activities ?Reestablish a consistent sleep-wake cycle ?Cooperate with a medical evaluation  ?Interventions ?Engage the patient in behavioral activation ?Use instruction, modeling, and role-playing to build the client's general social, communication, and/or conflict resolution skills ?Use Acceptance and Commitment Therapy to help client accept uncomfortable realities in order to accomplish value-consistent goals ?Reinforce the client's insight into the role of his/her past emotional pain and present anxiety  ?Support the client in following through with work, family, and social activities ?Teach and implement sleep hygiene practices  ?Refer the patient to a physician for a psychotropic medication consultation ?Monito the clint's psychotropic medication compliance ?Discuss how anxiety typically involves excessive  worry, various bodily expressions of tension, and avoidance of what is threatening that interact to maintain the problem  ?Teach the patient relaxation skills ?Assign the patient homework ?Discuss examples demonstrating that unrealistic worry overestimates the probability of threats and underestimates patient's ability  ?Assist the patient in analyzing his or her worries ?Help patient understand that avoidance is reinforcing  ? ?
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
## 2021-10-14 NOTE — Progress Notes (Signed)
                Ozil Stettler, PsyD 

## 2021-11-06 DIAGNOSIS — Z23 Encounter for immunization: Secondary | ICD-10-CM | POA: Diagnosis not present

## 2021-11-13 ENCOUNTER — Inpatient Hospital Stay: Payer: Medicare Other | Attending: Oncology

## 2021-11-13 DIAGNOSIS — F419 Anxiety disorder, unspecified: Secondary | ICD-10-CM | POA: Insufficient documentation

## 2021-11-13 DIAGNOSIS — I10 Essential (primary) hypertension: Secondary | ICD-10-CM | POA: Diagnosis not present

## 2021-11-13 DIAGNOSIS — C7951 Secondary malignant neoplasm of bone: Secondary | ICD-10-CM | POA: Insufficient documentation

## 2021-11-13 DIAGNOSIS — Z95828 Presence of other vascular implants and grafts: Secondary | ICD-10-CM

## 2021-11-13 DIAGNOSIS — C61 Malignant neoplasm of prostate: Secondary | ICD-10-CM | POA: Insufficient documentation

## 2021-11-13 DIAGNOSIS — Z9079 Acquired absence of other genital organ(s): Secondary | ICD-10-CM | POA: Insufficient documentation

## 2021-11-13 DIAGNOSIS — D649 Anemia, unspecified: Secondary | ICD-10-CM | POA: Diagnosis not present

## 2021-11-13 LAB — CBC WITH DIFFERENTIAL (CANCER CENTER ONLY)
Abs Immature Granulocytes: 0.02 10*3/uL (ref 0.00–0.07)
Basophils Absolute: 0 10*3/uL (ref 0.0–0.1)
Basophils Relative: 0 %
Eosinophils Absolute: 0.1 10*3/uL (ref 0.0–0.5)
Eosinophils Relative: 1 %
HCT: 36.5 % — ABNORMAL LOW (ref 39.0–52.0)
Hemoglobin: 12.2 g/dL — ABNORMAL LOW (ref 13.0–17.0)
Immature Granulocytes: 0 %
Lymphocytes Relative: 30 %
Lymphs Abs: 1.6 10*3/uL (ref 0.7–4.0)
MCH: 32.1 pg (ref 26.0–34.0)
MCHC: 33.4 g/dL (ref 30.0–36.0)
MCV: 96.1 fL (ref 80.0–100.0)
Monocytes Absolute: 0.6 10*3/uL (ref 0.1–1.0)
Monocytes Relative: 10 %
Neutro Abs: 3.1 10*3/uL (ref 1.7–7.7)
Neutrophils Relative %: 59 %
Platelet Count: 217 10*3/uL (ref 150–400)
RBC: 3.8 MIL/uL — ABNORMAL LOW (ref 4.22–5.81)
RDW: 13.6 % (ref 11.5–15.5)
WBC Count: 5.4 10*3/uL (ref 4.0–10.5)
nRBC: 0 % (ref 0.0–0.2)

## 2021-11-13 LAB — CMP (CANCER CENTER ONLY)
ALT: 11 U/L (ref 0–44)
AST: 17 U/L (ref 15–41)
Albumin: 4.1 g/dL (ref 3.5–5.0)
Alkaline Phosphatase: 45 U/L (ref 38–126)
Anion gap: 4 — ABNORMAL LOW (ref 5–15)
BUN: 21 mg/dL (ref 8–23)
CO2: 26 mmol/L (ref 22–32)
Calcium: 9.5 mg/dL (ref 8.9–10.3)
Chloride: 104 mmol/L (ref 98–111)
Creatinine: 0.95 mg/dL (ref 0.61–1.24)
GFR, Estimated: >60 mL/min (ref >60)
Glucose, Bld: 103 mg/dL — ABNORMAL HIGH (ref 70–99)
Potassium: 4.4 mmol/L (ref 3.5–5.1)
Sodium: 134 mmol/L — ABNORMAL LOW (ref 135–145)
Total Bilirubin: 0.3 mg/dL (ref 0.3–1.2)
Total Protein: 6.5 g/dL (ref 6.5–8.1)

## 2021-11-13 MED ORDER — SODIUM CHLORIDE 0.9% FLUSH
10.0000 mL | Freq: Once | INTRAVENOUS | Status: AC
  Administered 2021-11-13: 10 mL

## 2021-11-13 MED ORDER — HEPARIN SOD (PORK) LOCK FLUSH 100 UNIT/ML IV SOLN
500.0000 [IU] | Freq: Once | INTRAVENOUS | Status: AC
  Administered 2021-11-13: 500 [IU]

## 2021-11-13 NOTE — Patient Instructions (Signed)

## 2021-11-15 ENCOUNTER — Inpatient Hospital Stay: Payer: Medicare Other

## 2021-11-15 ENCOUNTER — Inpatient Hospital Stay (HOSPITAL_BASED_OUTPATIENT_CLINIC_OR_DEPARTMENT_OTHER): Payer: Medicare Other | Admitting: Oncology

## 2021-11-15 ENCOUNTER — Other Ambulatory Visit: Payer: Self-pay

## 2021-11-15 VITALS — BP 138/78 | HR 67 | Temp 97.8°F | Resp 15 | Ht 76.0 in | Wt 184.0 lb

## 2021-11-15 DIAGNOSIS — Z9079 Acquired absence of other genital organ(s): Secondary | ICD-10-CM | POA: Diagnosis not present

## 2021-11-15 DIAGNOSIS — C61 Malignant neoplasm of prostate: Secondary | ICD-10-CM | POA: Diagnosis not present

## 2021-11-15 DIAGNOSIS — D649 Anemia, unspecified: Secondary | ICD-10-CM | POA: Diagnosis not present

## 2021-11-15 DIAGNOSIS — I10 Essential (primary) hypertension: Secondary | ICD-10-CM | POA: Diagnosis not present

## 2021-11-15 DIAGNOSIS — C7951 Secondary malignant neoplasm of bone: Secondary | ICD-10-CM | POA: Diagnosis not present

## 2021-11-15 DIAGNOSIS — Z95828 Presence of other vascular implants and grafts: Secondary | ICD-10-CM

## 2021-11-15 DIAGNOSIS — F419 Anxiety disorder, unspecified: Secondary | ICD-10-CM | POA: Diagnosis not present

## 2021-11-15 LAB — PROSTATE-SPECIFIC AG, SERUM (LABCORP): Prostate Specific Ag, Serum: <0.1 ng/mL (ref 0.0–4.0)

## 2021-11-15 MED ORDER — DENOSUMAB 120 MG/1.7ML ~~LOC~~ SOLN
120.0000 mg | Freq: Once | SUBCUTANEOUS | Status: DC
  Filled 2021-11-15: qty 1.7

## 2021-11-15 NOTE — Progress Notes (Signed)
Per Dr. Alen Blew  on 11/15/2021 progress notes. " I recommended continue to hold Xgeva until he is dental issues has resolved."

## 2021-11-15 NOTE — Progress Notes (Signed)
Hematology and Oncology Follow Up Visit  Philip [name] [id number] 11-26-1945 77 y.o. 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  He presented with PSA of 2900 at the time of diagnosis.   Prior Therapy: Status post orchiectomy done on November 2016.  Taxotere chemotherapy at 75 mg/m to start on 02/16/2015. He is completed 6 cycles of therapy in March 2017.  Zytiga 1000 mg daily started in July 2017.  Therapy discontinued in 2023 due to insurance purposes.  Current therapy: Xtandi 160 mg daily started in February 2023.  Interim History:  [name] returns today for a follow-up visit.  Since last visit, he reports feeling well without any major complaints.  He denies any nausea vomiting or abdominal pain.  He denies any hospitalizations or illnesses.  He met with Dr. Michail Sermon from a [facility] psychology and found encounter to be very helpful.  He has a follow-up next week as well.  He still having dental issues with planning dental procedure in the near future.  He denies any complications related to [facility].   Medications: Updated on review. Current Outpatient Medications  Medication Sig Dispense Refill   aspirin EC 81 MG tablet Take 81 mg by mouth daily. Swallow whole.     atorvastatin (LIPITOR) 10 MG tablet TAKE 1 TABLET BY MOUTH EVERY DAY 90 tablet 1   BIOTIN 5000 PO Take 1 tablet by mouth daily.     CALCIUM-VITAMIN D PO Take 1 tablet by mouth daily.     Denosumab (XGEVA Arnold) Inject into the skin. Every 6-8 weeks.     enzalutamide (XTANDI) 40 MG tablet Take 4 tablets (156m) by mouth once daily as directed by physician. 120 tablet 12   ferrous sulfate 325 (65 FE) MG EC tablet Take 1 tablet (325 mg total) by mouth daily with breakfast. 90 tablet 1   fexofenadine (ALLEGRA) 180 MG tablet Take 180 mg by mouth daily.      hydrOXYzine (ATARAX/VISTARIL) 10 MG tablet Take 10 mg by mouth daily.     KLOR-CON M20 20 MEQ tablet TAKE 1 TABLET BY MOUTH TWICE A DAY 180 tablet 0   lidocaine-prilocaine (EMLA) cream Apply 1 application topically as needed. (Patient taking differently: Apply 1 application. topically as needed (access port).) 30 g 0   LORazepam (ATIVAN) 1 MG tablet TAKE 1 TABLET BY MOUTH EVERY 8 HOURS AS NEEDED FOR ANXIETY 90 tablet 0   Multiple Vitamin (MULTIVITAMIN) tablet Take 1 tablet by mouth daily.     olmesartan (BENICAR) 40 MG tablet TAKE 1 TABLET BY MOUTH EVERY DAY 90 tablet 1   omeprazole (PRILOSEC) 20 MG capsule TAKE 1 CAPSULE BY MOUTH DAILY AS NEEDED. 90 capsule 1   No current facility-administered medications for this visit.     Allergies:  Allergies  Allergen Reactions   Prednisone Anxiety      Physical Exam:      Blood pressure 138/78, pulse 67, temperature 97.8 F (36.6 C), temperature source Temporal, resp. rate 15, height _0  (1.93 m), weight 184 lb (83.5 kg), SpO2 97 %.      ECOG: 0     General appearance: Comfortable appearing without any discomfort Head: Normocephalic without any trauma Oropharynx: Mucous membranes are moist and pink without any thrush or ulcers. Eyes: Pupils are equal and round reactive to light. Lymph nodes: No cervical, supraclavicular, inguinal or axillary  lymphadenopathy.   Heart:regular rate and rhythm.  S1 and S2 without leg edema. Lung: Clear without any rhonchi or wheezes.  No dullness to percussion. Abdomin: Soft, nontender, nondistended with good bowel sounds.  No hepatosplenomegaly. Musculoskeletal: No joint deformity or effusion.  Full range of motion noted. Neurological: No deficits noted on motor, sensory and deep tendon reflex exam. Skin: No petechial rash or dryness.  Appeared moist.                          Lab Results: Lab Results  Component Value Date   WBC 5.4 11/13/2021   HGB 12.2 (L) 11/13/2021    HCT 36.5 (L) 11/13/2021   MCV 96.1 11/13/2021   PLT 217 11/13/2021   PSA 0.02 (L) 03/26/2021     Chemistry      Component Value Date/Time   NA 134 (L) 11/13/2021 1236   NA 139 02/10/2017 1323   K 4.4 11/13/2021 1236   K 3.4 (L) 02/10/2017 1323   CL 104 11/13/2021 1236   CO2 26 11/13/2021 1236   CO2 23 02/10/2017 1323   BUN 21 11/13/2021 1236   BUN 13.6 02/10/2017 1323   CREATININE 0.95 11/13/2021 1236   CREATININE 0.9 02/10/2017 1323      Component Value Date/Time   CALCIUM 9.5 11/13/2021 1236   CALCIUM 9.2 02/10/2017 1323   ALKPHOS 45 11/13/2021 1236   ALKPHOS 50 02/10/2017 1323   AST 17 11/13/2021 1236   AST 20 02/10/2017 1323   ALT 11 11/13/2021 1236   ALT 15 02/10/2017 1323   BILITOT 0.3 11/13/2021 1236   BILITOT 0.52 02/10/2017 1323        Latest Reference Range & Units 09/10/21 14:12 11/13/21 12:36  Prostate Specific Ag, Serum 0.0 - 4.0 ng/mL <0.1 <0.1         Impression and Plan:  76 year old man with:  1.  Castration-resistant advanced prostate cancer with disease to the bone diagnosed in 2016.    The natural course of this disease was reviewed at this time and treatment options were discussed.  His PSA continues to be undetectable with excellent tolerance to Xtandi.  Risks and benefits of continuing this treatment were discussed at this time.  Complications including hypertension, edema and fatigue were reviewed.  Alternative treatment options including chemo therapy or Pluvicto were reiterated.  At this time he is agreeable to continue.  We will defer any additional therapy to the future.  2. IV access: We will continue to be flushed periodically.  3. Hypertension: No issues reported with his blood pressure currently under control.  4.  Androgen depravation: He is status post orchiectomy without any additional need for androgen deprivation.  5.  Bone directed therapy: Risks and benefits of restarting Xgeva were reviewed at this time.  I  recommended continue to hold Xgeva until he is dental issues has resolved.  6.  Anemia: Continues to improve at this time with hemoglobin close to normal range.  7.  Anxiety: He is established with psychologist and has begun  therapy sessions.  He has noticed some improvement.  8. Follow-up: He will return in 2 months for a follow-up.  30 minutes were dedicated to this encounter.  The time was spent on updating his disease status, reviewing treatment choices and outlining future plan of care discussion.  Zola Button, MD 9/15/202311:54 AM

## 2021-11-18 ENCOUNTER — Telehealth: Payer: Self-pay | Admitting: Internal Medicine

## 2021-11-18 NOTE — Telephone Encounter (Signed)
patient to call back to schedule Medicare Annual Wellness Visit   Last AWV  11/19/20  Please schedule at anytime with [facility] if patient calls the office back.     Any questions, please call me at [phone]

## 2021-11-18 NOTE — Telephone Encounter (Signed)
LM with patients wife to have pt rtn my call to schedule awv with [facility]. Call back number given [phone]

## 2021-11-19 ENCOUNTER — Ambulatory Visit (INDEPENDENT_AMBULATORY_CARE_PROVIDER_SITE_OTHER): Payer: Medicare Other | Admitting: Psychologist

## 2021-11-19 DIAGNOSIS — F411 Generalized anxiety disorder: Secondary | ICD-10-CM | POA: Diagnosis not present

## 2021-11-19 NOTE — Progress Notes (Signed)
[facility] Counselor/Therapist Progress Note  Patient ID: Philip [name], MRN: [id number],    Date: 11/19/2021  Time Spent: 09:03 am to 09:45 am; total time: 42 minutes   This session was held via in person. The patient consented to in-person therapy and was in the clinician's office. Limits of confidentiality were discussed with the patient.   Treatment Type: Individual Therapy  Reported Symptoms: Anxiety  Mental Status Exam: Appearance:  Well Groomed     Behavior: Appropriate  Motor: Normal  Speech/Language:  Clear and Coherent  Affect: Appropriate  Mood: normal  Thought process: normal  Thought content:   WNL  Sensory/Perceptual disturbances:   WNL  Orientation: oriented to person, place, and time/date  Attention: Good  Concentration: Good  Memory: WNL  Fund of knowledge:  Good  Insight:   Good  Judgment:  Good  Impulse Control: Good   Risk Assessment: Danger to Self:  No Self-injurious Behavior: No Danger to Others: No Duty to Warn:no Physical Aggression / Violence:No  Access to Firearms a concern: No  Gang Involvement:No   Subjective: Beginning the session, patient described himself as doing well indicated that guided imagery has helped him. After reviewing the treatment plan, patient talked about being distressed by thoughts he experiences. Patient explored ways to resolve some of the concerns related to those thoughts. He also participated in defusion, describing defusion as helpful. He was agreeable to homework and following up. He denied suicidal and homicidal ideation.    Interventions:  Worked on developing a therapeutic relationship with the patient using active listening and reflective statements. Provided emotional support using empathy and validation. Reviewed the treatment plan with the patient. Reviewed events since the last session. Praised the patient for having benefit from guided imagery. Identified goals for the session. Normalized and  validated expressed thoughts and emotions. Identified the theme of control and worries. Explored what thoughts were most distressing to the patient. Validated those concerns. Used socratic questions to assist the patient. Provided psychoeducation about defusion. Practiced and processed defusion. Praised patient for experiencing less distress. Assisted in problem solving. Assigned homework. Assessed for suicidal and homicidal ideation.   Homework: Implement defusion  Next Session: Review homework and emotional support.   Diagnosis: F41.1 generalized anxiety disorder.   Plan:   Goals Reduce overall frequency, intensity, and duration of anxiety Stabilize anxiety level wile increasing ability to function Enhance ability to effectively cope with full variety of stressors Learn and implement coping skills that result in a reduction of anxiety   Objectives target date for all objectives is 10/15/2022 Verbalize an understanding of the cognitive, physiological, and behavioral components of anxiety Learning and implement calming skills to reduce overall anxiety Verbalize an understanding of the role that cognitive biases play in excessive irrational worry and persistent anxiety symptoms Identify, challenge, and replace based fearful talk Learn and implement problem solving strategies Identify and engage in pleasant activities Learning and implement personal and interpersonal skills to reduce anxiety and improve interpersonal relationships Learn to accept limitations in life and commit to tolerating, rather than avoiding, unpleasant emotions while accomplishing meaningful goals Identify major life conflicts from the past and present that form the basis for present anxiety Maintain involvement in work, family, and social activities Reestablish a consistent sleep-wake cycle Cooperate with a medical evaluation  Interventions Engage the patient in behavioral activation Use instruction, modeling, and  role-playing to build the client's general social, communication, and/or conflict resolution skills Use Acceptance and Commitment Therapy to help client accept uncomfortable  realities in order to accomplish value-consistent goals Reinforce the client's insight into the role of his/her past emotional pain and present anxiety  Support the client in following through with work, family, and social activities Teach and implement sleep hygiene practices  Refer the patient to a physician for a psychotropic medication consultation Monito the clint's psychotropic medication compliance Discuss how anxiety typically involves excessive worry, various bodily expressions of tension, and avoidance of what is threatening that interact to maintain the problem  Teach the patient relaxation skills Assign the patient homework Discuss examples demonstrating that unrealistic worry overestimates the probability of threats and underestimates patient's ability  Assist the patient in analyzing his or her worries Help patient understand that avoidance is reinforcing     The patient and clinician reviewed the treatment plan on 11/19/2021. The patient approved of the treatment plan.   Conception Chancy, PsyD

## 2021-11-19 NOTE — Progress Notes (Signed)
                Philip Geisen, PsyD 

## 2021-11-22 ENCOUNTER — Other Ambulatory Visit: Payer: Self-pay | Admitting: Internal Medicine

## 2021-11-22 DIAGNOSIS — D509 Iron deficiency anemia, unspecified: Secondary | ICD-10-CM

## 2021-12-03 DIAGNOSIS — Z23 Encounter for immunization: Secondary | ICD-10-CM | POA: Diagnosis not present

## 2021-12-04 ENCOUNTER — Ambulatory Visit (INDEPENDENT_AMBULATORY_CARE_PROVIDER_SITE_OTHER): Payer: Medicare Other

## 2021-12-04 VITALS — Ht 76.0 in | Wt 184.0 lb

## 2021-12-04 DIAGNOSIS — Z0000 Encounter for general adult medical examination without abnormal findings: Secondary | ICD-10-CM | POA: Diagnosis not present

## 2021-12-04 NOTE — Patient Instructions (Signed)
[name] , Thank you for taking time to come for your Medicare Wellness Visit. I appreciate your ongoing commitment to your health goals. Please review the following plan we discussed and let me know if I can assist you in the future.   These are the goals we discussed:  Goals       My goal is to increase my physical activity by walking and streching more.      Patient Stated (pt-stated)      My goal is to stay healthy by keeping my blood pressure and cholesterol down.        This is a list of the screening recommended for you and due dates:  Health Maintenance  Topic Date Due   Zoster (Shingles) Vaccine (1 of 2) Never done   COVID-19 Vaccine (8 - Pfizer risk series) 01/28/2022   Tetanus Vaccine  07/21/2022   Pneumonia Vaccine  Completed   Flu Shot  Completed   Hepatitis C Screening: USPSTF Recommendation to screen - Ages 18-79 yo.  Completed   HPV Vaccine  Aged Out   Cologuard (Stool DNA test)  Discontinued   RSV Vaccine: 11/02/2021 Flu Vaccine; 11/06/2021 Covid Vaccine: 04/10/2019, 05/04/2019, 11/30/2019, 06/06/2020, 11/01/2020, 07/08/2021, 12/03/2021  Advanced directives: Yes  Conditions/risks identified: Yes  Next appointment: Follow up in one year for your annual wellness visit.   Preventive Care 23 Years and Older, Male  Preventive care refers to lifestyle choices and visits with your health care provider that can promote health and wellness. What does preventive care include? A yearly physical exam. This is also called an annual well check. Dental exams once or twice a year. Routine eye exams. Ask your health care provider how often you should have your eyes checked. Personal lifestyle choices, including: Daily care of your teeth and gums. Regular physical activity. Eating a healthy diet. Avoiding tobacco and drug use. Limiting alcohol use. Practicing safe sex. Taking low doses of aspirin every day. Taking vitamin and mineral supplements as recommended by your health care  provider. What happens during an annual well check? The services and screenings done by your health care provider during your annual well check will depend on your age, overall health, lifestyle risk factors, and family history of disease. Counseling  Your health care provider may ask you questions about your: Alcohol use. Tobacco use. Drug use. Emotional well-being. Home and relationship well-being. Sexual activity. Eating habits. History of falls. Memory and ability to understand (cognition). Work and work Statistician. Screening  You may have the following tests or measurements: Height, weight, and BMI. Blood pressure. Lipid and cholesterol levels. These may be checked every 5 years, or more frequently if you are over 28 years old. Skin check. Lung cancer screening. You may have this screening every year starting at age 71 if you have a 30-pack-year history of smoking and currently smoke or have quit within the past 15 years. Fecal occult blood test (FOBT) of the stool. You may have this test every year starting at age 51. Flexible sigmoidoscopy or colonoscopy. You may have a sigmoidoscopy every 5 years or a colonoscopy every 10 years starting at age 68. Prostate cancer screening. Recommendations will vary depending on your family history and other risks. Hepatitis C blood test. Hepatitis B blood test. Sexually transmitted disease (STD) testing. Diabetes screening. This is done by checking your blood sugar (glucose) after you have not eaten for a while (fasting). You may have this done every 1-3 years. Abdominal aortic aneurysm (AAA)  screening. You may need this if you are a current or former smoker. Osteoporosis. You may be screened starting at age 7 if you are at high risk. Talk with your health care provider about your test results, treatment options, and if necessary, the need for more tests. Vaccines  Your health care provider may recommend certain vaccines, such  as: Influenza vaccine. This is recommended every year. Tetanus, diphtheria, and acellular pertussis (Tdap, Td) vaccine. You may need a Td booster every 10 years. Zoster vaccine. You may need this after age 47. Pneumococcal 13-valent conjugate (PCV13) vaccine. One dose is recommended after age 82. Pneumococcal polysaccharide (PPSV23) vaccine. One dose is recommended after age 50. Talk to your health care provider about which screenings and vaccines you need and how often you need them. This information is not intended to replace advice given to you by your health care provider. Make sure you discuss any questions you have with your health care provider. Document Released: 03/16/2015 Document Revised: 11/07/2015 Document Reviewed: 12/19/2014 Elsevier Interactive Patient Education  2017 [place] Prevention in the Home Falls can cause injuries. They can happen to people of all ages. There are many things you can do to make your home safe and to help prevent falls. What can I do on the outside of my home? Regularly fix the edges of walkways and driveways and fix any cracks. Remove anything that might make you trip as you walk through a door, such as a raised step or threshold. Trim any bushes or trees on the path to your home. Use bright outdoor lighting. Clear any walking paths of anything that might make someone trip, such as rocks or tools. Regularly check to see if handrails are loose or broken. Make sure that both sides of any steps have handrails. Any raised decks and porches should have guardrails on the edges. Have any leaves, snow, or ice cleared regularly. Use sand or salt on walking paths during winter. Clean up any spills in your garage right away. This includes oil or grease spills. What can I do in the bathroom? Use night lights. Install grab bars by the toilet and in the tub and shower. Do not use towel bars as grab bars. Use non-skid mats or decals in the tub or  shower. If you need to sit down in the shower, use a plastic, non-slip stool. Keep the floor dry. Clean up any water that spills on the floor as soon as it happens. Remove soap buildup in the tub or shower regularly. Attach bath mats securely with double-sided non-slip rug tape. Do not have throw rugs and other things on the floor that can make you trip. What can I do in the bedroom? Use night lights. Make sure that you have a light by your bed that is easy to reach. Do not use any sheets or blankets that are too big for your bed. They should not hang down onto the floor. Have a firm chair that has side arms. You can use this for support while you get dressed. Do not have throw rugs and other things on the floor that can make you trip. What can I do in the kitchen? Clean up any spills right away. Avoid walking on wet floors. Keep items that you use a lot in easy-to-reach places. If you need to reach something above you, use a strong step stool that has a grab bar. Keep electrical cords out of the way. Do not use floor polish or  wax that makes floors slippery. If you must use wax, use non-skid floor wax. Do not have throw rugs and other things on the floor that can make you trip. What can I do with my stairs? Do not leave any items on the stairs. Make sure that there are handrails on both sides of the stairs and use them. Fix handrails that are broken or loose. Make sure that handrails are as long as the stairways. Check any carpeting to make sure that it is firmly attached to the stairs. Fix any carpet that is loose or worn. Avoid having throw rugs at the top or bottom of the stairs. If you do have throw rugs, attach them to the floor with carpet tape. Make sure that you have a light switch at the top of the stairs and the bottom of the stairs. If you do not have them, ask someone to add them for you. What else can I do to help prevent falls? Wear shoes that: Do not have high heels. Have  rubber bottoms. Are comfortable and fit you well. Are closed at the toe. Do not wear sandals. If you use a stepladder: Make sure that it is fully opened. Do not climb a closed stepladder. Make sure that both sides of the stepladder are locked into place. Ask someone to hold it for you, if possible. Clearly mark and make sure that you can see: Any grab bars or handrails. First and last steps. Where the edge of each step is. Use tools that help you move around (mobility aids) if they are needed. These include: Canes. Walkers. Scooters. Crutches. Turn on the lights when you go into a dark area. Replace any light bulbs as soon as they burn out. Set up your furniture so you have a clear path. Avoid moving your furniture around. If any of your floors are uneven, fix them. If there are any pets around you, be aware of where they are. Review your medicines with your doctor. Some medicines can make you feel dizzy. This can increase your chance of falling. Ask your doctor what other things that you can do to help prevent falls. This information is not intended to replace advice given to you by your health care provider. Make sure you discuss any questions you have with your health care provider. Document Released: 12/14/2008 Document Revised: 07/26/2015 Document Reviewed: 03/24/2014 Elsevier Interactive Patient Education  2017 [employer].

## 2021-12-04 NOTE — Progress Notes (Signed)
Virtual Visit via Telephone Note  I connected with  Philip [name] on 12/04/21 at  9:45 AM EDT by telephone and verified that I am speaking with the correct person using two identifiers.  Location: Patient: HOME Provider: [facility] Persons participating in the virtual visit: patient/Nurse Health Advisor   I discussed the limitations, risks, security and privacy concerns of performing an evaluation and management service by telephone and the availability of in person appointments. The patient expressed understanding and agreed to proceed.  Interactive audio and video telecommunications were attempted between this nurse and patient, however failed, due to patient having technical difficulties OR patient did not have access to video capability.  We continued and completed visit with audio only.  Some vital signs may be absent or patient reported.   Sheral Flow, LPN  Subjective:   Philip [name] is a 76 y.o. male who presents for Medicare Annual/Subsequent preventive examination.  Review of Systems     Cardiac Risk Factors include: advanced age (>73mn, >>64women);hypertension;dyslipidemia;male gender;family history of premature cardiovascular disease     Objective:    Today's Vitals   12/04/21 1005  Weight: 184 lb (83.5 kg)  Height: '6\' 4"'$  (1.93 m)  PainSc: 0-No pain   Body mass index is 22.4 kg/m.     12/04/2021    9:52 AM 06/07/2021    1:41 PM 03/08/2021    4:00 PM 03/07/2021    1:15 PM 11/19/2020   10:31 AM 12/14/2019   11:03 AM 09/22/2019    9:55 AM  Advanced Directives  Does Patient Have a Medical Advance Directive? Yes No Yes Yes Yes Yes No  Type of AP[place]amed[place]of [place]  [place] Living will Living will;Healthcare Power of Attorney   Does patient want to make changes to medical advance directive?   No - Patient declined  No - Patient declined No - Patient  declined No - Patient declined  Copy of [place] Chart? No - copy requested     No - copy requested   Would patient like information on creating a medical advance directive?  No - Patient declined    No - Patient declined No - Patient declined    Current Medications (verified) Outpatient Encounter Medications as of 12/04/2021  Medication Sig   aspirin EC 81 MG tablet Take 81 mg by mouth daily. Swallow whole.   atorvastatin (LIPITOR) 10 MG tablet TAKE 1 TABLET BY MOUTH EVERY DAY   BIOTIN 5000 PO Take 1 tablet by mouth daily.   CALCIUM-VITAMIN D PO Take 1 tablet by mouth daily.   Denosumab (XGEVA [place]) Inject into the skin. Every 6-8 weeks.   enzalutamide (XTANDI) 40 MG tablet Take 4 tablets ('160mg'$ ) by mouth once daily as directed by physician.   ferrous sulfate 325 (65 FE) MG EC tablet TAKE 1 TABLET BY MOUTH EVERY DAY WITH BREAKFAST   fexofenadine (ALLEGRA) 180 MG tablet Take 180 mg by mouth daily.   hydrOXYzine (ATARAX/VISTARIL) 10 MG tablet Take 10 mg by mouth daily.   KLOR-CON M20 20 MEQ tablet TAKE 1 TABLET BY MOUTH TWICE A DAY   lidocaine-prilocaine (EMLA) cream Apply 1 application topically as needed. (Patient taking differently: Apply 1 application. topically as needed (access port).)   LORazepam (ATIVAN) 1 MG tablet TAKE 1 TABLET BY MOUTH EVERY 8 HOURS AS NEEDED FOR ANXIETY   Multiple Vitamin (MULTIVITAMIN) tablet Take 1 tablet by mouth daily.  olmesartan (BENICAR) 40 MG tablet TAKE 1 TABLET BY MOUTH EVERY DAY   omeprazole (PRILOSEC) 20 MG capsule TAKE 1 CAPSULE BY MOUTH DAILY AS NEEDED.   [DISCONTINUED] azelastine (ASTELIN) 0.1 % nasal spray PLACE 1 SPRAY INTO BOTH NOSTRILS 2 (TWO) TIMES DAILY. USE IN EACH NOSTRIL AS DIRECTED   No facility-administered encounter medications on file as of 12/04/2021.    Allergies (verified) Prednisone   History: Past Medical History:  Diagnosis Date   Atherosclerosis of aorta ([place]) 03/11/2017   The 10-year ASCVD risk score  Mikey Bussing DC Jr., et al., 2013) is: 24.6%   Values used to calculate the score:     Age: 29 years     Sex: Male     Is Non-Hispanic African American: No     Diabetic: No     Tobacco smoker: No     Systolic Blood Pressure: 992 mmHg     Is BP treated: No     HDL Cholesterol: 71.1 mg/dL     Total Cholesterol: 212 mg/dL   Cancer (HCC)    PROSTATE   Chronic idiopathic constipation 03/11/2017   Edema leg Sept 28, 2016   left leg from foot to thigh, increasinly worse over last 4 weeks   Essential hypertension 03/30/2018   Gastroesophageal reflux disease without esophagitis 03/11/2017   Hyperlipidemia LDL goal <100 03/16/2017   The 10-year ASCVD risk score Mikey Bussing DC Jr., et al., 2013) is: 30.6%   Values used to calculate the score:     Age: 89 years     Sex: Male     Is Non-Hispanic African American: No     Diabetic: No     Tobacco smoker: No     Systolic Blood Pressure: 426 mmHg     Is BP treated: Yes     HDL Cholesterol: 69.3 mg/dL     Total Cholesterol: 211 mg/dL   Hypertension    Hypoglycemia    Swelling LAST 30 DAYS DR [facility] AWARE   LEFT LEG AND FOOT   Past Surgical History:  Procedure Laterality Date   BOWEL DECOMPRESSION N/A 03/07/2021   Procedure: BOWEL DECOMPRESSION;  Surgeon: Yetta Flock, MD;  Location: [facility];  Service: Gastroenterology;  Laterality: N/A;   BOWEL DECOMPRESSION N/A 06/07/2021   Procedure: BOWEL DECOMPRESSION;  Surgeon: Carol Ada, MD;  Location: [facility];  Service: Gastroenterology;  Laterality: N/A;   FLEXIBLE SIGMOIDOSCOPY N/A 03/07/2021   Procedure: FLEXIBLE SIGMOIDOSCOPY;  Surgeon: Yetta Flock, MD;  Location: [facility];  Service: Gastroenterology;  Laterality: N/A;   FLEXIBLE SIGMOIDOSCOPY N/A 06/07/2021   Procedure: FLEXIBLE SIGMOIDOSCOPY;  Surgeon: Carol Ada, MD;  Location: [facility];  Service: Gastroenterology;  Laterality: N/A;   GUM SURGERY     TEETH IMPLANTS ALSO   LAPAROSCOPIC SIGMOID COLECTOMY N/A 06/10/2021   Procedure: LAPAROSCOPIC  ASSISTED SIGMOID COLECTOMY;  Surgeon: Johnathan Hausen, MD;  Location: [facility];  Service: General;  Laterality: N/A;   ORCHIECTOMY Bilateral 01/03/2015   Procedure: ORCHIECTOMY;  Surgeon: Alexis Frock, MD;  Location: [facility];  Service: Urology;  Laterality: Bilateral;   Family History  Problem Relation Age of Onset   CVA Mother    Lymphoma Mother    Pneumonia Mother    CAD Father    Osteoarthritis Brother    Colon cancer Neg Hx    Social History   Socioeconomic History   Marital status: Married    Spouse name: Not on file   Number of children: Not on file   Years  of education: Not on file   Highest education level: Not on file  Occupational History   Not on file  Tobacco Use   Smoking status: Never   Smokeless tobacco: Never  Vaping Use   Vaping Use: Never used  Substance and Sexual Activity   Alcohol use: Yes    Alcohol/week: 1.0 standard drink of alcohol    Types: 1 Glasses of wine per week    Comment: occasional wine with dinner   Drug use: No   Sexual activity: Not on file  Other Topics Concern   Not on file  Social History Narrative   Not on file   Social Determinants of Health   Financial Resource Strain: Low Risk  (12/04/2021)   Overall Financial Resource Strain (CARDIA)    Difficulty of Paying Living Expenses: Not hard at all  Food Insecurity: No Food Insecurity (12/04/2021)   Hunger Vital Sign    Worried About Running Out of Food in the Last Year: Never true    Ran Out of Food in the Last Year: Never true  Transportation Needs: No Transportation Needs (12/04/2021)   PRAPARE - Hydrologist (Medical): No    Lack of Transportation (Non-Medical): No  Physical Activity: Sufficiently Active (12/04/2021)   Exercise Vital Sign    Days of Exercise per Week: 5 days    Minutes of Exercise per Session: 30 min  Stress: No Stress Concern Present (12/04/2021)   [place]     Feeling of Stress : Not at all  Social Connections: Moderately Isolated (12/04/2021)   Social Connection and Isolation Panel [NHANES]    Frequency of Communication with Friends and Family: More than three times a week    Frequency of Social Gatherings with Friends and Family: More than three times a week    Attends Religious Services: Never    Marine scientist or Organizations: Patient refused    Attends Archivist Meetings: Never    Marital Status: Married    Tobacco Counseling Counseling given: Not Answered   Clinical Intake:  Pre-visit preparation completed: Yes  Pain : No/denies pain Pain Score: 0-No pain     BMI - recorded: 22.4 Nutritional Status: BMI of 19-24  Normal Nutritional Risks: None Diabetes: No  How often do you need to have someone help you when you read instructions, pamphlets, or other written materials from your doctor or pharmacy?: 1 - Never What is the last grade level you completed in school?: HSG; B[employer]'S [employer]GREE  Diabetic? NO  Interpreter Needed?: No  Information entered by :: Lisette Abu, LPN.   Activities of Daily Living    12/04/2021    9:57 AM 06/07/2021    1:44 PM  In your present state of health, do you have any difficulty performing the following activities:  Hearing? 0   Vision? 0   Difficulty concentrating or making decisions? 0   Walking or climbing stairs? 0   Dressing or bathing? 0   Doing errands, shopping? 0 0  Preparing Food and eating ? N   Using the Toilet? N   In the past six months, have you accidently leaked urine? N   Do you have problems with loss of bowel control? N   Managing your Medications? N   Managing your Finances? N   Housekeeping or managing your Housekeeping? N     Patient Care Team: [name], MD as PCP - General (  Internal Medicine) Amedeo Kinsman, OD as Consulting Physician (Optometry) Alexis Frock, MD as Consulting Physician (Urology) [name], MD as  Consulting Physician (Oncology)  Indicate any recent Medical Services you may have received from other than [facility] providers in the past year (date may be approximate).     Assessment:   This is a routine wellness examination for Philip [name].  Hearing/Vision screen Hearing Screening - Comments:: Denies hearing difficulties   Vision Screening - Comments:: Wears rx contacts - up to date with routine eye exams with    Dietary issues and exercise activities discussed: Current Exercise Habits: Home exercise routine, Type of exercise: walking, Time (Minutes): 30, Frequency (Times/Week): 5, Weekly Exercise (Minutes/Week): 150, Intensity: Moderate, Exercise limited by: None identified   Goals Addressed             This Visit's Progress    My goal is to increase my physical activity by walking and streching more.        Depression Screen    12/04/2021    9:57 AM 11/19/2020   10:29 AM 12/27/2019    9:35 AM 03/30/2018    9:20 AM 03/29/2018    2:07 PM  PHQ 2/9 Scores  PHQ - 2 Score 0 0 0 0 0    Fall Risk    12/04/2021    9:53 AM 11/19/2020   10:34 AM 05/09/2019   10:38 AM 03/30/2018    9:20 AM 03/29/2018    2:07 PM  [place] in the past year? 0 0 0 0 0  Number falls in past yr: 0 0 0  0  Injury with Fall? 0 0 0  0  Risk for fall due to : No Fall Risks No Fall Risks   Other (Comment)  Follow up Falls prevention discussed Falls evaluation completed   Falls evaluation completed    [place]:  Any stairs in or around the home? Yes  If so, are there any without handrails? No  Home free of loose throw rugs in walkways, pet beds, electrical cords, etc? Yes  Adequate lighting in your home to reduce risk of falls? Yes   ASSISTIVE DEVICES UTILIZED TO PREVENT FALLS:  Life alert? No  Use of a cane, walker or w/c? No  Grab bars in the bathroom? Yes  Shower chair or bench in shower? Yes  Elevated toilet seat or a handicapped toilet? Yes   TIMED UP  AND GO:  Was the test performed? No . PHONE VISIT   Cognitive Function:        12/04/2021   10:08 AM  6CIT Screen  What Year? 0 points  What month? 0 points  What time? 0 points  Count back from 20 0 points  Months in reverse 0 points  Repeat phrase 0 points  Total Score 0 points    Immunizations Immunization History  Administered Date(s) Administered   Fluad Quad(high Dose 65+) 11/24/2018, 11/14/2019, 11/12/2020, 11/06/2021   Influenza, High Dose Seasonal PF 12/03/2015, 12/01/2016, 11/22/2017   Influenza-Unspecified 01/19/2015, 12/03/2015, 11/22/2017   PFIZER(Purple Top)SARS-COV-2 Vaccination 04/10/2019, 05/04/2019, 11/30/2019, 06/06/2020, 11/12/2020   Pfizer Covid-19 Vaccine Bivalent Booster 26yr & up 07/08/2021, 12/03/2021   Pneumococcal Conjugate-13 03/11/2017   Pneumococcal Polysaccharide-23 11/14/2019   Pneumococcal-Unspecified 07/20/2012   Respiratory Syncytial Virus Vaccine,Recomb Aduvanted(Arexvy) 11/02/2021   Tdap 07/20/2012    TDAP status: Up to date  Flu Vaccine status: Up to date  Pneumococcal vaccine status: Up to date  Covid-19 vaccine status: Completed vaccines  Qualifies for Shingles Vaccine? Yes   Zostavax completed No   Shingrix Completed?: No.    Education has been provided regarding the importance of this vaccine. Patient has been advised to call insurance company to determine out of pocket expense if they have not yet received this vaccine. Advised may also receive vaccine at local pharmacy or Health Dept. Verbalized acceptance and understanding.  Screening Tests Health Maintenance  Topic Date Due   Zoster Vaccines- Shingrix (1 of 2) Never done   COVID-19 Vaccine (8 - Pfizer risk series) 01/28/2022   TETANUS/TDAP  07/21/2022   Pneumonia Vaccine 50+ Years old  Completed   INFLUENZA VACCINE  Completed   Hepatitis C Screening  Completed   HPV VACCINES  Aged Out   Fecal DNA (Cologuard)  Discontinued    Health Maintenance  Health  Maintenance Due  Topic Date Due   Zoster Vaccines- Shingrix (1 of 2) Never done    Colorectal cancer screening: No longer required.   Lung Cancer Screening: (Low Dose CT Chest recommended if Age 18-80 years, 30 pack-year currently smoking OR have quit w/in 15years.) does not qualify.   Lung Cancer Screening Referral: NO  Additional Screening:  Hepatitis C Screening: does qualify; Completed 03/11/2017  Vision Screening: Recommended annual ophthalmology exams for early detection of glaucoma and other disorders of the eye. Is the patient up to date with their annual eye exam?  Yes  Who is the provider or what is the name of the office in which the patient attends annual eye exams? JOSEPH PEREZ, OD. If pt is not established with a provider, would they like to be referred to a provider to establish care? No .   Dental Screening: Recommended annual dental exams for proper oral hygiene  Community Resource Referral / Chronic Care Management: CRR required this visit?  No   CCM required this visit?  No      Plan:     I have personally reviewed and noted the following in the patient's chart:   Medical and social history Use of alcohol, tobacco or illicit drugs  Current medications and supplements including opioid prescriptions. Patient is not currently taking opioid prescriptions. Functional ability and status Nutritional status Physical activity Advanced directives List of other physicians Hospitalizations, surgeries, and ER visits in previous 12 months Vitals Screenings to include cognitive, depression, and falls Referrals and appointments  In addition, I have reviewed and discussed with patient certain preventive protocols, quality metrics, and best practice recommendations. A written personalized care plan for preventive services as well as general preventive health recommendations were provided to patient.     Sheral Flow, LPN   [id number]   Nurse Notes: N/A

## 2021-12-06 NOTE — Telephone Encounter (Signed)
error 

## 2021-12-09 ENCOUNTER — Ambulatory Visit: Payer: Medicare Other | Admitting: Psychologist

## 2021-12-09 ENCOUNTER — Encounter: Payer: Self-pay | Admitting: Internal Medicine

## 2021-12-11 DIAGNOSIS — L821 Other seborrheic keratosis: Secondary | ICD-10-CM | POA: Diagnosis not present

## 2021-12-11 DIAGNOSIS — L720 Epidermal cyst: Secondary | ICD-10-CM | POA: Diagnosis not present

## 2021-12-11 DIAGNOSIS — L814 Other melanin hyperpigmentation: Secondary | ICD-10-CM | POA: Diagnosis not present

## 2021-12-11 DIAGNOSIS — D1801 Hemangioma of skin and subcutaneous tissue: Secondary | ICD-10-CM | POA: Diagnosis not present

## 2021-12-18 ENCOUNTER — Ambulatory Visit: Payer: Medicare Other | Admitting: Psychologist

## 2021-12-19 ENCOUNTER — Ambulatory Visit (INDEPENDENT_AMBULATORY_CARE_PROVIDER_SITE_OTHER): Payer: Medicare Other | Admitting: Psychologist

## 2021-12-19 DIAGNOSIS — F411 Generalized anxiety disorder: Secondary | ICD-10-CM

## 2021-12-19 NOTE — Progress Notes (Signed)
[place] Counselor/Therapist Progress Note  Patient ID: Philip [name], MRN: [id number],    Date: 12/19/2021  Time Spent: 03:05 pm to 03:45 pm; total time: 40 minutes   This session was held via in person. The patient consented to in-person therapy and was in the clinician's office. Limits of confidentiality were discussed with the patient.   Treatment Type: Individual Therapy  Reported Symptoms: Anxiety  Mental Status Exam: Appearance:  Well Groomed     Behavior: Appropriate  Motor: Normal  Speech/Language:  Clear and Coherent  Affect: Appropriate  Mood: normal  Thought process: normal  Thought content:   WNL  Sensory/Perceptual disturbances:   WNL  Orientation: oriented to person, place, and time/date  Attention: Good  Concentration: Good  Memory: WNL  Fund of knowledge:  Good  Insight:   Good  Judgment:  Good  Impulse Control: Good   Risk Assessment: Danger to Self:  No Self-injurious Behavior: No Danger to Others: No Duty to Warn:no Physical Aggression / Violence:No  Access to Firearms a concern: No  Gang Involvement:No   Subjective: Beginning the session, patient described himself as doing well while taking about the trip he had with his family and family to [facility]. He voiced experiencing some anxiety one of the days at the beach. He also voiced concern about lack of appetite and wondering if that is anxiety related. He then spent time expressing mild distress due to losing strength following his colon surgery and wanting to get some strength back. He also reflected on anxiety's role and how it may serve a purpose. Patient stated that he is becoming less reliant on medication to assist him. He was agreeable to following up. He denied suicidal and homicidal ideation.    Interventions:  Worked on developing a therapeutic relationship with the patient using active listening and reflective statements. Provided emotional support using empathy and  validation. Reviewed the treatment plan with the patient. Praised the patient for doing well and talked about how the trip went with his family. Processed thoughts and emotions. Identified goals for the session. Explored the etiology of anxiety at the beach. Normalized the response and processed how it may have been the body's response to being in a new environment. Provided psychoeducation about how being in nature assist those with anxiety. Used socratic questions to assist the patient related to concern of lack of appetite. Validated the concern. Encouraged patient to follow through with scans to make sure something physiologically is not contributing. Explored concerns regarding lack of strength. Explored different ways to address concern. Provided psychoeducation regarding the role of anxiety in life. Assessed for suicidal and homicidal ideation.   Homework: Continue to use coping strategies to assist him with anxiety  Next Session: Review homework and emotional support. Discuss concerns of appetite, strength and impact of cancer on life  Diagnosis: F41.1 generalized anxiety disorder.   Plan:   Goals Reduce overall frequency, intensity, and duration of anxiety Stabilize anxiety level wile increasing ability to function Enhance ability to effectively cope with full variety of stressors Learn and implement coping skills that result in a reduction of anxiety   Objectives target date for all objectives is 10/15/2022 Verbalize an understanding of the cognitive, physiological, and behavioral components of anxiety Learning and implement calming skills to reduce overall anxiety Verbalize an understanding of the role that cognitive biases play in excessive irrational worry and persistent anxiety symptoms Identify, challenge, and replace based fearful talk Learn and implement problem solving strategies Identify and  engage in pleasant activities Learning and implement personal and interpersonal  skills to reduce anxiety and improve interpersonal relationships Learn to accept limitations in life and commit to tolerating, rather than avoiding, unpleasant emotions while accomplishing meaningful goals Identify major life conflicts from the past and present that form the basis for present anxiety Maintain involvement in work, family, and social activities Reestablish a consistent sleep-wake cycle Cooperate with a medical evaluation  Interventions Engage the patient in behavioral activation Use instruction, modeling, and role-playing to build the client's general social, communication, and/or conflict resolution skills Use Acceptance and Commitment Therapy to help client accept uncomfortable realities in order to accomplish value-consistent goals Reinforce the client's insight into the role of his/her past emotional pain and present anxiety  Support the client in following through with work, family, and social activities Teach and implement sleep hygiene practices  Refer the patient to a physician for a psychotropic medication consultation Monito the clint's psychotropic medication compliance Discuss how anxiety typically involves excessive worry, various bodily expressions of tension, and avoidance of what is threatening that interact to maintain the problem  Teach the patient relaxation skills Assign the patient homework Discuss examples demonstrating that unrealistic worry overestimates the probability of threats and underestimates patient's ability  Assist the patient in analyzing his or her worries Help patient understand that avoidance is reinforcing     The patient and clinician reviewed the treatment plan on 11/19/2021. The patient approved of the treatment plan.   Conception Chancy, PsyD

## 2021-12-20 ENCOUNTER — Other Ambulatory Visit: Payer: Self-pay | Admitting: Oncology

## 2021-12-20 DIAGNOSIS — R2681 Unsteadiness on feet: Secondary | ICD-10-CM

## 2021-12-20 DIAGNOSIS — C61 Malignant neoplasm of prostate: Secondary | ICD-10-CM

## 2021-12-25 ENCOUNTER — Ambulatory Visit: Payer: Medicare Other

## 2021-12-25 NOTE — Progress Notes (Addendum)
Pt came in for BP check and it within normal range

## 2021-12-29 ENCOUNTER — Other Ambulatory Visit: Payer: Self-pay | Admitting: Internal Medicine

## 2021-12-29 DIAGNOSIS — E876 Hypokalemia: Secondary | ICD-10-CM

## 2021-12-29 DIAGNOSIS — I10 Essential (primary) hypertension: Secondary | ICD-10-CM

## 2021-12-30 ENCOUNTER — Encounter: Payer: Self-pay | Admitting: Internal Medicine

## 2021-12-31 DIAGNOSIS — H5203 Hypermetropia, bilateral: Secondary | ICD-10-CM | POA: Diagnosis not present

## 2021-12-31 DIAGNOSIS — H25013 Cortical age-related cataract, bilateral: Secondary | ICD-10-CM | POA: Diagnosis not present

## 2021-12-31 DIAGNOSIS — H2513 Age-related nuclear cataract, bilateral: Secondary | ICD-10-CM | POA: Diagnosis not present

## 2021-12-31 DIAGNOSIS — H52223 Regular astigmatism, bilateral: Secondary | ICD-10-CM | POA: Diagnosis not present
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
## 2022-01-01 ENCOUNTER — Ambulatory Visit: Payer: Medicare Other | Attending: Oncology | Admitting: Physical Therapy

## 2022-01-01 ENCOUNTER — Other Ambulatory Visit: Payer: Self-pay

## 2022-01-01 ENCOUNTER — Encounter: Payer: Self-pay | Admitting: Physical Therapy

## 2022-01-01 DIAGNOSIS — M25562 Pain in left knee: Secondary | ICD-10-CM | POA: Diagnosis not present

## 2022-01-01 DIAGNOSIS — G8929 Other chronic pain: Secondary | ICD-10-CM | POA: Insufficient documentation

## 2022-01-01 DIAGNOSIS — R2681 Unsteadiness on feet: Secondary | ICD-10-CM | POA: Insufficient documentation

## 2022-01-01 DIAGNOSIS — C61 Malignant neoplasm of prostate: Secondary | ICD-10-CM | POA: Diagnosis not present

## 2022-01-01 DIAGNOSIS — M6281 Muscle weakness (generalized): Secondary | ICD-10-CM | POA: Insufficient documentation

## 2022-01-08 ENCOUNTER — Ambulatory Visit: Payer: Medicare Other

## 2022-01-10 ENCOUNTER — Encounter: Payer: Medicare Other | Admitting: Physical Therapy

## 2022-01-13 ENCOUNTER — Ambulatory Visit: Payer: Medicare Other | Admitting: Psychologist

## 2022-01-14 ENCOUNTER — Ambulatory Visit (INDEPENDENT_AMBULATORY_CARE_PROVIDER_SITE_OTHER): Payer: Medicare Other | Admitting: Internal Medicine

## 2022-01-14 ENCOUNTER — Ambulatory Visit (INDEPENDENT_AMBULATORY_CARE_PROVIDER_SITE_OTHER): Payer: Medicare Other

## 2022-01-14 ENCOUNTER — Encounter: Payer: Medicare Other | Admitting: Physical Therapy

## 2022-01-14 ENCOUNTER — Encounter: Payer: Self-pay | Admitting: Internal Medicine

## 2022-01-14 VITALS — BP 138/76 | HR 80 | Temp 98.2°F | Ht 76.0 in | Wt 185.0 lb

## 2022-01-14 DIAGNOSIS — I10 Essential (primary) hypertension: Secondary | ICD-10-CM | POA: Diagnosis not present

## 2022-01-14 DIAGNOSIS — M7989 Other specified soft tissue disorders: Secondary | ICD-10-CM | POA: Diagnosis not present

## 2022-01-14 DIAGNOSIS — M79671 Pain in right foot: Secondary | ICD-10-CM | POA: Diagnosis not present

## 2022-01-14 DIAGNOSIS — S99921A Unspecified injury of right foot, initial encounter: Secondary | ICD-10-CM | POA: Insufficient documentation

## 2022-01-14 NOTE — Progress Notes (Unsigned)
Subjective:  Patient ID: Philip [name], male    DOB: [birth year]  Age: 76 y.o. MRN: [id number]  CC: No chief complaint on file.   HPI Philip [name] presents for ***  Outpatient Medications Prior to Visit  Medication Sig Dispense Refill   aspirin EC 81 MG tablet Take 81 mg by mouth daily. Swallow whole.     atorvastatin (LIPITOR) 10 MG tablet TAKE 1 TABLET BY MOUTH EVERY DAY 90 tablet 1   BIOTIN 5000 PO Take 1 tablet by mouth daily.     CALCIUM-VITAMIN D PO Take 1 tablet by mouth daily.     Denosumab (XGEVA [place]) Inject into the skin. Every 6-8 weeks.     enzalutamide (XTANDI) 40 MG tablet Take 4 tablets ('160mg'$ ) by mouth once daily as directed by physician. 120 tablet 12   ferrous sulfate 325 (65 FE) MG EC tablet TAKE 1 TABLET BY MOUTH EVERY DAY WITH BREAKFAST 90 tablet 1   fexofenadine (ALLEGRA) 180 MG tablet Take 180 mg by mouth daily.     hydrOXYzine (ATARAX/VISTARIL) 10 MG tablet Take 10 mg by mouth daily.     KLOR-CON M20 20 MEQ tablet TAKE 1 TABLET BY MOUTH TWICE A DAY 180 tablet 0   lidocaine-prilocaine (EMLA) cream Apply 1 application topically as needed. (Patient taking differently: Apply 1 application  topically as needed (access port).) 30 g 0   LORazepam (ATIVAN) 1 MG tablet TAKE 1 TABLET BY MOUTH EVERY 8 HOURS AS NEEDED FOR ANXIETY 90 tablet 0   Multiple Vitamin (MULTIVITAMIN) tablet Take 1 tablet by mouth daily.     olmesartan (BENICAR) 40 MG tablet TAKE 1 TABLET BY MOUTH EVERY DAY 90 tablet 1   omeprazole (PRILOSEC) 20 MG capsule TAKE 1 CAPSULE BY MOUTH DAILY AS NEEDED. 90 capsule 1   No facility-administered medications prior to visit.    ROS Review of Systems  Objective:  BP 138/76 (BP Location: Left Arm, Patient Position: Sitting, Cuff Size: Large)   Pulse 80   Temp 98.2 F (36.8 C) (Oral)   Ht '6\' 4"'$  (1.93 m)   Wt 185 lb (83.9 kg)   SpO2 99%   BMI 22.52 kg/m   BP Readings from Last 3 Encounters:  01/14/22 138/76  12/25/21 120/68  11/15/21 138/78    Wt  Readings from Last 3 Encounters:  01/14/22 185 lb (83.9 kg)  12/04/21 184 lb (83.5 kg)  11/15/21 184 lb (83.5 kg)    Physical Exam  Lab Results  Component Value Date   WBC 5.4 11/13/2021   HGB 12.2 (L) 11/13/2021   HCT 36.5 (L) 11/13/2021   PLT 217 11/13/2021   GLUCOSE 103 (H) 11/13/2021   CHOL 181 05/09/2020   TRIG 220.0 (H) 05/09/2020   HDL 72.70 05/09/2020   LDLDIRECT 75.0 05/09/2020   ALT 11 11/13/2021   AST 17 11/13/2021   NA 134 (L) 11/13/2021   K 4.4 11/13/2021   CL 104 11/13/2021   CREATININE 0.95 11/13/2021   BUN 21 11/13/2021   CO2 26 11/13/2021   TSH 1.87 03/07/2021   PSA 0.02 (L) 03/26/2021   INR 1.06 02/09/2015    DG ABD ACUTE 2+V W 1V CHEST  Result Date: 06/07/2021 CLINICAL DATA:  Abdominal pain and nausea, history of volvulus EXAM: DG ABDOMEN ACUTE WITH 1 VIEW CHEST COMPARISON:  KUB and CT abdomen/pelvis 03/07/2021 FINDINGS: Chest: A right chest wall port is in place. The cardiomediastinal silhouette is normal. There is no focal consolidation or pulmonary edema. There is  no pleural effusion or pneumothorax Abdomen: There is a loop of large bowel in the mid abdomen which is dilated measuring up to 11.8 cm with a layering fluid level. There is no evidence of free intraperitoneal air. There is no abnormal soft tissue calcification. There is no acute osseous abnormality. There is levoscoliosis of the lumbar spine. IMPRESSION: Findings above concerning for recurrent sigmoid volvulus. Recommend surgical consultation. These results were called by telephone at the time of interpretation on 06/07/2021 at 11:06 am to provider Sherwood Gambler , who verbally acknowledged these results. Electronically Signed   By: Valetta Mole M.D.   On: 06/07/2021 11:03   DG Foot Complete Right  Result Date: 01/14/2022 CLINICAL DATA:  Acute right foot pain and swelling after injury. EXAM: RIGHT FOOT COMPLETE - 3+ VIEW COMPARISON:  None Available. FINDINGS: There is no evidence of fracture or  dislocation. Moderate hallux valgus deformity of the first metatarsophalangeal joint is noted. Soft tissues are unremarkable. IMPRESSION: No acute abnormality seen. Electronically Signed   By: Marijo Conception M.D.   On: 01/14/2022 13:56      Assessment & Plan:   Diagnoses and all orders for this visit:  Right foot injury, initial encounter -     DG Foot Complete Right; Future   I am having Philip [name] maintain his fexofenadine, BIOTIN 5000 PO, Denosumab (XGEVA [place]), multivitamin, hydrOXYzine, lidocaine-prilocaine, CALCIUM-VITAMIN D PO, aspirin EC, olmesartan, enzalutamide, atorvastatin, LORazepam, omeprazole, ferrous sulfate, and K[place]-Con M20.  No orders of the defined types were placed in this encounter.    Follow-up: No follow-ups on file.  Scarlette Calico, MD

## 2022-01-14 NOTE — Patient Instructions (Signed)
Foot Sprain  A foot sprain is an injury to one of the ligaments in the feet. Ligaments are strong tissues that connect bones to each other. The ligament can be stretched too much. In some cases, it may tear. A tear can be either partial or complete. The severity of the sprain depends on how much of the ligament was damaged or torn. What are the causes? This condition is usually caused by suddenly twisting or pivoting your foot. What increases the risk? You are more likely to develop this condition if: You play a sport, such as basketball or football. You exercise or play a sport without first warming up your muscles. You start a new workout or sport. You suddenly increase how long or hard you exercise or play a sport. You have injured your foot or ankle before. What are the signs or symptoms? Symptoms of this condition start soon after an injury and include: Pain, especially in the arch of your foot. Bruising. Swelling. Being unable to walk or use your foot to support body weight. How is this diagnosed? This condition is diagnosed with a medical history and physical exam. You may also have imaging tests, such as: X-rays to check for broken bones (fractures). An MRI to see if the ligament is torn. How is this treated? Treatment for this condition depends on the severity of the sprain. Mild sprains and major sprains can be treated with: Rest, ice, pressure (compression), and elevation (RICE). Elevation means raising your injured foot. Keeping your foot in a fixed position (immobilization) for a period of time. This is done if your ligament is overstretched or partially torn. Your health care provider will apply a bandage, splint, or walking boot to keep your foot from moving until it heals. Using crutches or a scooter for a few weeks to avoid bearing weight on your foot while it is healing. Physical therapy exercises to improve movement and strength in your foot. Major sprains may also be  treated with: Surgery. This is done if your ligament is fully torn and a procedure is needed to reconnect it to the bone. A cast or splint. This will be needed after surgery. A cast or splint will need to stay on your foot while it heals. Follow these instructions at home: If you have a bandage, splint, or boot: Wear it as told by your health care provider. Remove it only as told by your health care provider. Loosen it if your toes tingle, become numb, or turn cold and blue. Keep it clean and dry. If you have a cast: Do not put pressure on any part of the cast until it is fully hardened. This may take several hours. Do not stick anything inside the cast to scratch your skin. Doing that increases your risk for infection. Check the skin around the cast every day. Tell your health care provider about any concerns. You may put lotion on dry skin around the edges of the cast. Do not put lotion on the skin underneath the cast. Keep it clean and dry. Bathing Do not take baths, swim, or use a hot tub until your health care provider approves. Ask your health care provider if you may take showers. You may only be allowed to take sponge baths. If the bandage, splint, boot, or cast is not waterproof: Do not let it get wet. Cover it with a watertight covering when you take a bath or shower. Managing pain, stiffness, and swelling  If directed, put ice on the  injured area. To do this: If you have a removable bandage, splint, or boot, remove it as told by your health care provider. Put ice in a plastic bag. Place a towel between your skin and the bag, or between your cast and the bag. Leave the ice on for 20 minutes, 2-3 times per day. Remove the ice if your skin turns bright red. This is very important. If you cannot feel pain, heat, or cold, you have a greater risk of damage to the area. Move your toes often to reduce stiffness and swelling. Elevate the injured area above the level of your heart while  you are sitting or lying down. Activity Do not use the injured foot to support your body weight until your health care provider says that you can. Use crutches or a scooter as told by your health care provider. Ask your health care provider what activities are safe for you. Do exercises as told by your health care provider. Gradually increase how much and how far you walk until your health care provider says it is safe to return to full activity. Driving Ask your health care provider if the medicine prescribed to you requires you to avoid driving or using machinery. Ask your health care provider when it is safe to drive if you have a bandage, splint, boot, or cast on your foot. General instructions Take over-the-counter and prescription medicines only as told by your health care provider. When you can walk without pain, wear supportive shoes that have stiff soles. Do not wear flip-flops. Do not walk barefoot. Keep all follow-up visits. This is important. Contact a health care provider if: Medicine does not help your pain. Your bruising or swelling gets worse or does not get better with treatment. Your splint, boot, or cast is damaged. Get help right away if: You develop severe numbness or tingling in your foot. Your foot turns blue, white, or gray, and it feels cold. Summary A foot sprain is an injury to one of the ligaments in the feet. Ligaments are strong tissues that connect bones to each other. You may need a bandage, splint, boot, or cast to support your foot while it heals. Sometimes, surgery may be needed. You may need physical therapy exercises to improve movement and strength in your foot. This information is not intended to replace advice given to you by your health care provider. Make sure you discuss any questions you have with your health care provider. Document Revised: 06/10/2019 Document Reviewed: 06/10/2019 Elsevier Patient Education  [employer].

## 2022-01-15 ENCOUNTER — Inpatient Hospital Stay: Payer: Medicare Other | Attending: Oncology

## 2022-01-15 DIAGNOSIS — I10 Essential (primary) hypertension: Secondary | ICD-10-CM | POA: Diagnosis not present

## 2022-01-15 DIAGNOSIS — D630 Anemia in neoplastic disease: Secondary | ICD-10-CM | POA: Diagnosis not present

## 2022-01-15 DIAGNOSIS — C7951 Secondary malignant neoplasm of bone: Secondary | ICD-10-CM | POA: Insufficient documentation

## 2022-01-15 DIAGNOSIS — F419 Anxiety disorder, unspecified: Secondary | ICD-10-CM | POA: Diagnosis not present

## 2022-01-15 DIAGNOSIS — Z9079 Acquired absence of other genital organ(s): Secondary | ICD-10-CM | POA: Insufficient documentation

## 2022-01-15 DIAGNOSIS — Z95828 Presence of other vascular implants and grafts: Secondary | ICD-10-CM

## 2022-01-15 DIAGNOSIS — C61 Malignant neoplasm of prostate: Secondary | ICD-10-CM | POA: Diagnosis not present

## 2022-01-15 LAB — CMP (CANCER CENTER ONLY)
ALT: 15 U/L (ref 0–44)
AST: 18 U/L (ref 15–41)
Albumin: 3.9 g/dL (ref 3.5–5.0)
Alkaline Phosphatase: 49 U/L (ref 38–126)
Anion gap: 6 (ref 5–15)
BUN: 24 mg/dL — ABNORMAL HIGH (ref 8–23)
CO2: 26 mmol/L (ref 22–32)
Calcium: 9.1 mg/dL (ref 8.9–10.3)
Chloride: 106 mmol/L (ref 98–111)
Creatinine: 1.08 mg/dL (ref 0.61–1.24)
GFR, Estimated: >60 mL/min (ref >60)
Glucose, Bld: 101 mg/dL — ABNORMAL HIGH (ref 70–99)
Potassium: 4.5 mmol/L (ref 3.5–5.1)
Sodium: 138 mmol/L (ref 135–145)
Total Bilirubin: 0.4 mg/dL (ref 0.3–1.2)
Total Protein: 7 g/dL (ref 6.5–8.1)

## 2022-01-15 LAB — CBC WITH DIFFERENTIAL (CANCER CENTER ONLY)
Abs Immature Granulocytes: 0.03 10*3/uL (ref 0.00–0.07)
Basophils Absolute: 0 10*3/uL (ref 0.0–0.1)
Basophils Relative: 0 %
Eosinophils Absolute: 0 10*3/uL (ref 0.0–0.5)
Eosinophils Relative: 0 %
HCT: 33.5 % — ABNORMAL LOW (ref 39.0–52.0)
Hemoglobin: 11.3 g/dL — ABNORMAL LOW (ref 13.0–17.0)
Immature Granulocytes: 0 %
Lymphocytes Relative: 20 %
Lymphs Abs: 1.6 10*3/uL (ref 0.7–4.0)
MCH: 32.5 pg (ref 26.0–34.0)
MCHC: 33.7 g/dL (ref 30.0–36.0)
MCV: 96.3 fL (ref 80.0–100.0)
Monocytes Absolute: 0.9 10*3/uL (ref 0.1–1.0)
Monocytes Relative: 11 %
Neutro Abs: 5.4 10*3/uL (ref 1.7–7.7)
Neutrophils Relative %: 69 %
Platelet Count: 256 10*3/uL (ref 150–400)
RBC: 3.48 MIL/uL — ABNORMAL LOW (ref 4.22–5.81)
RDW: 13.2 % (ref 11.5–15.5)
WBC Count: 8 10*3/uL (ref 4.0–10.5)
nRBC: 0.3 % — ABNORMAL HIGH (ref 0.0–0.2)

## 2022-01-15 MED ORDER — METHYLPREDNISOLONE SODIUM SUCC 125 MG IJ SOLR: 125.0000 mg | Freq: Once | INTRAMUSCULAR | Status: DC | PRN

## 2022-01-15 MED ORDER — SODIUM CHLORIDE 0.9 % IV SOLN: Freq: Once | INTRAVENOUS | Status: DC | PRN

## 2022-01-15 MED ORDER — DIPHENHYDRAMINE HCL 50 MG/ML IJ SOLN: 25.0000 mg | Freq: Once | INTRAMUSCULAR | Status: DC | PRN

## 2022-01-15 MED ORDER — EPINEPHRINE HCL 0.1 MG/ML IJ SOLN: 0.2500 mg | Freq: Once | INTRAMUSCULAR | Status: DC | PRN

## 2022-01-15 MED ORDER — DENOSUMAB 120 MG/1.7ML ~~LOC~~ SOLN: 120.0000 mg | Freq: Once | SUBCUTANEOUS | Status: DC

## 2022-01-15 MED ORDER — HEPARIN SOD (PORK) LOCK FLUSH 100 UNIT/ML IV SOLN
500.0000 [IU] | Freq: Once | INTRAVENOUS | Status: AC
  Administered 2022-01-15: 500 [IU]

## 2022-01-15 MED ORDER — ALBUTEROL SULFATE (2.5 MG/3ML) 0.083% IN NEBU: 2.5000 mg | INHALATION_SOLUTION | Freq: Once | RESPIRATORY_TRACT | Status: DC | PRN

## 2022-01-15 MED ORDER — SODIUM CHLORIDE 0.9% FLUSH
10.0000 mL | Freq: Once | INTRAVENOUS | Status: AC
  Administered 2022-01-15: 10 mL

## 2022-01-15 MED ORDER — DIPHENHYDRAMINE HCL 50 MG/ML IJ SOLN: 50.0000 mg | Freq: Once | INTRAMUSCULAR | Status: DC | PRN

## 2022-01-16 ENCOUNTER — Encounter (HOSPITAL_COMMUNITY)
Admission: RE | Admit: 2022-01-16 | Discharge: 2022-01-16 | Disposition: A | Discharge status: Home / Self Care | Payer: Medicare Other | Source: Ambulatory Visit | Attending: Oncology | Admitting: Oncology

## 2022-01-16 DIAGNOSIS — C61 Malignant neoplasm of prostate: Secondary | ICD-10-CM | POA: Diagnosis not present

## 2022-01-16 DIAGNOSIS — K769 Liver disease, unspecified: Secondary | ICD-10-CM | POA: Diagnosis not present

## 2022-01-16 LAB — PROSTATE-SPECIFIC AG, SERUM (LABCORP): Prostate Specific Ag, Serum: <0.1 ng/mL (ref 0.0–4.0)

## 2022-01-16 MED ORDER — PIFLIFOLASTAT F 18 (PYLARIFY) INJECTION
9.0000 | Freq: Once | INTRAVENOUS | Status: AC
  Administered 2022-01-16: 8.8 via INTRAVENOUS

## 2022-01-17 ENCOUNTER — Encounter: Payer: Medicare Other | Admitting: Physical Therapy

## 2022-01-17 ENCOUNTER — Inpatient Hospital Stay (HOSPITAL_BASED_OUTPATIENT_CLINIC_OR_DEPARTMENT_OTHER): Payer: Medicare Other | Admitting: Oncology

## 2022-01-17 ENCOUNTER — Inpatient Hospital Stay: Payer: Medicare Other

## 2022-01-17 VITALS — BP 147/77 | HR 77 | Temp 97.5°F | Resp 17 | Wt 187.2 lb

## 2022-01-17 DIAGNOSIS — D630 Anemia in neoplastic disease: Secondary | ICD-10-CM | POA: Diagnosis not present

## 2022-01-17 DIAGNOSIS — F419 Anxiety disorder, unspecified: Secondary | ICD-10-CM | POA: Diagnosis not present

## 2022-01-17 DIAGNOSIS — C7951 Secondary malignant neoplasm of bone: Secondary | ICD-10-CM | POA: Diagnosis not present

## 2022-01-17 DIAGNOSIS — Z9079 Acquired absence of other genital organ(s): Secondary | ICD-10-CM | POA: Diagnosis not present

## 2022-01-17 DIAGNOSIS — I10 Essential (primary) hypertension: Secondary | ICD-10-CM | POA: Diagnosis not present

## 2022-01-17 DIAGNOSIS — C61 Malignant neoplasm of prostate: Secondary | ICD-10-CM | POA: Diagnosis not present

## 2022-01-17 NOTE — Progress Notes (Signed)
Hematology and Oncology Follow Up Visit  Philip [name] [id number] September 11, 1945 76 y.o. 01/17/2022 12:40 PM [name], MDJones, [name], MD   Principle Diagnosis: 76 year old man with advanced prostate cancer with disease to the bone and lymphadenopathy diagnosed in 2016.  He has castration-resistant after presenting with PSA of 2900.    Prior Therapy: Status post orchiectomy done on November 2016.  Taxotere chemotherapy at 75 mg/m to start on 02/16/2015. He is completed 6 cycles of therapy in March 2017.  Zytiga 1000 mg daily started in July 2017.  Therapy discontinued in 2023 due to insurance purposes.  Current therapy: Xtandi 160 mg daily started in February 2023.  Interim History:  Mr. Meschke here for repeat evaluation.  Since the last visit, he reports no major changes in his health.  He denies any recent hospitalizations or illnesses.  He continues to receive physical therapy with improvement in his activity level and knee pain.  He also continues to participate in counseling which has helped dealing with his anxiety.  He denies any complications related to Xtandi at this time.  Denies any nausea, fatigue or increased blood pressure.  He continues to have dental surgery and currently healing from another infection.   Medications: Reviewed without changes. Current Outpatient Medications  Medication Sig Dispense Refill   aspirin EC 81 MG tablet Take 81 mg by mouth daily. Swallow whole.     atorvastatin (LIPITOR) 10 MG tablet TAKE 1 TABLET BY MOUTH EVERY DAY 90 tablet 1   BIOTIN 5000 PO Take 1 tablet by mouth daily.     CALCIUM-VITAMIN D PO Take 1 tablet by mouth daily.     Denosumab (XGEVA [place]) Inject into the skin. Every 6-8 weeks.     enzalutamide (XTANDI) 40 MG tablet Take 4 tablets ('160mg'$ ) by mouth once daily as directed by physician. 120 tablet 12   ferrous sulfate 325 (65 FE) MG EC tablet TAKE 1 TABLET BY MOUTH EVERY DAY WITH BREAKFAST 90 tablet 1   fexofenadine (ALLEGRA) 180  MG tablet Take 180 mg by mouth daily.     hydrOXYzine (ATARAX/VISTARIL) 10 MG tablet Take 10 mg by mouth daily.     KLOR-CON M20 20 MEQ tablet TAKE 1 TABLET BY MOUTH TWICE A DAY 180 tablet 0   lidocaine-prilocaine (EMLA) cream Apply 1 application topically as needed. (Patient taking differently: Apply 1 application  topically as needed (access port).) 30 g 0   LORazepam (ATIVAN) 1 MG tablet TAKE 1 TABLET BY MOUTH EVERY 8 HOURS AS NEEDED FOR ANXIETY 90 tablet 0   Multiple Vitamin (MULTIVITAMIN) tablet Take 1 tablet by mouth daily.     olmesartan (BENICAR) 40 MG tablet TAKE 1 TABLET BY MOUTH EVERY DAY 90 tablet 1   omeprazole (PRILOSEC) 20 MG capsule TAKE 1 CAPSULE BY MOUTH DAILY AS NEEDED. 90 capsule 1   No current facility-administered medications for this visit.     Allergies:  Allergies  Allergen Reactions   Prednisone Anxiety      Physical Exam:       Blood pressure (!) 147/77, pulse 77, temperature (!) 97.5 F (36.4 C), temperature source Temporal, resp. rate 17, weight 187 lb 3 oz (84.9 kg), SpO2 99 %.      ECOG: 0    General appearance: Alert, awake without any distress. Head: Atraumatic without abnormalities Oropharynx: Without any thrush or ulcers. Eyes: No scleral icterus. Lymph nodes: No lymphadenopathy noted in the cervical, supraclavicular, or axillary nodes Heart:regular rate and rhythm, without  any murmurs or gallops.   Lung: Clear to auscultation without any rhonchi, wheezes or dullness to percussion. Abdomin: Soft, nontender without any shifting dullness or ascites. Musculoskeletal: No clubbing or cyanosis. Neurological: No motor or sensory deficits. Skin: No rashes or lesions.                         Lab Results: Lab Results  Component Value Date   WBC 8.0 01/15/2022   HGB 11.3 (L) 01/15/2022   HCT 33.5 (L) 01/15/2022   MCV 96.3 01/15/2022   PLT 256 01/15/2022   PSA 0.02 (L) 03/26/2021     Chemistry      Component  Value Date/Time   NA 138 01/15/2022 1354   NA 139 02/10/2017 1323   K 4.5 01/15/2022 1354   K 3.4 (L) 02/10/2017 1323   CL 106 01/15/2022 1354   CO2 26 01/15/2022 1354   CO2 23 02/10/2017 1323   BUN 24 (H) 01/15/2022 1354   BUN 13.6 02/10/2017 1323   CREATININE 1.08 01/15/2022 1354   CREATININE 0.9 02/10/2017 1323      Component Value Date/Time   CALCIUM 9.1 01/15/2022 1354   CALCIUM 9.2 02/10/2017 1323   ALKPHOS 49 01/15/2022 1354   ALKPHOS 50 02/10/2017 1323   AST 18 01/15/2022 1354   AST 20 02/10/2017 1323   ALT 15 01/15/2022 1354   ALT 15 02/10/2017 1323   BILITOT 0.4 01/15/2022 1354   BILITOT 0.52 02/10/2017 1323        Latest Reference Range & Units 11/13/21 12:36 01/15/22 13:54  Prostate Specific Ag, Serum 0.0 - 4.0 ng/mL <0.1 <0.1         Impression and Plan:  76 year old man with:  1.  Advanced prostate cancer with disease to the bone and lymphadenopathy diagnosed in 2016.  He has castration-resistant disease at this time.  He continues to have excellent response to his current therapy with PSA that is undetectable.  His PSMA PET scan was obtained today and currently pending.  I recommended continuing at the same dose and schedule and use different salvage therapy option if he has disease progression.  These options including a PARP inhibitor if he harbors the appropriate mutation, Pluvicto or Jevtana chemotherapy.  He is agreeable to continue at this time.  2. IV access: Port-A-Cath remains in place and will be flushed periodically.  3. Hypertension: Blood pressure is mildly elevated but overall under control on Xtandi.  4.  Androgen depravation: He is status post orchiectomy without any additional need for androgen deprivation.  5.  Bone directed therapy: He has received Xgeva in the past but currently on hold due to dental issues.  This will be resumed in the future once his dental surgery has resolved.  6.  Anemia: Related to malignancy and treatment.   Iron level has been replaced previously.  7.  Anxiety: He continues to receive behavioral therapy with improvement and his coping skills.  8. Follow-up: In 2 months for a follow-up visit.  30 minutes were spent on this visit.  The time was dedicated to reviewing laboratory data, disease status update and outlining future plan of care discussion.  Zola Button, MD 11/17/202312:40 PM

## 2022-01-20 ENCOUNTER — Encounter: Payer: Medicare Other | Admitting: Physical Therapy

## 2022-01-20 ENCOUNTER — Telehealth: Payer: Self-pay | Admitting: *Deleted

## 2022-01-20 NOTE — Telephone Encounter (Signed)
-----   Message from [name], MD sent at 01/20/2022  8:36 AM EST ----- Please let him know his scan is clear. No cancer found

## 2022-01-20 NOTE — Telephone Encounter (Signed)
PC to patient, no answer, left VM - informed patient of his PET scan results, no cancer found.  Instructed patient to call this office with any questions/concerns. [phone].

## 2022-01-22 ENCOUNTER — Encounter: Payer: Medicare Other | Admitting: Physical Therapy

## 2022-01-28 ENCOUNTER — Ambulatory Visit: Payer: Medicare Other | Admitting: Physical Therapy

## 2022-01-28 ENCOUNTER — Encounter: Payer: Medicare Other | Admitting: Physical Therapy

## 2022-01-31 ENCOUNTER — Ambulatory Visit: Payer: Medicare Other | Attending: Oncology | Admitting: Physical Therapy

## 2022-01-31 ENCOUNTER — Encounter: Payer: Self-pay | Admitting: Physical Therapy

## 2022-01-31 DIAGNOSIS — R2681 Unsteadiness on feet: Secondary | ICD-10-CM | POA: Insufficient documentation

## 2022-01-31 DIAGNOSIS — G8929 Other chronic pain: Secondary | ICD-10-CM | POA: Diagnosis not present

## 2022-01-31 DIAGNOSIS — M6281 Muscle weakness (generalized): Secondary | ICD-10-CM | POA: Insufficient documentation

## 2022-01-31 DIAGNOSIS — M25562 Pain in left knee: Secondary | ICD-10-CM | POA: Insufficient documentation

## 2022-01-31 NOTE — Therapy (Signed)
OUTPATIENT PHYSICAL THERAPY TREATMENT   Patient Name: Philip [name] MRN: [id number] DOB:23-Aug-1945, 76 y.o., male Today's Date: 01/31/2022   PT End of Session - 01/31/22 1101     Visit Number 2    Date for PT Re-Evaluation 02/26/22    Authorization Type MCR    Progress Note Due on Visit 10    PT Start Time 1101    PT Stop Time 1145    PT Time Calculation (min) 44 min    Activity Tolerance Patient tolerated treatment well    Behavior During Therapy [facility] for tasks assessed/performed             Past Medical History:  Diagnosis Date   Atherosclerosis of aorta ([place]) 03/11/2017   The 10-year ASCVD risk score Mikey Bussing DC Jr., et al., 2013) is: 24.6%   Values used to calculate the score:     Age: 72 years     Sex: Male     Is Non-Hispanic African American: No     Diabetic: No     Tobacco smoker: No     Systolic Blood Pressure: 854 mmHg     Is BP treated: No     HDL Cholesterol: 71.1 mg/dL     Total Cholesterol: 212 mg/dL   Cancer (HCC)    PROSTATE   Chronic idiopathic constipation 03/11/2017   Edema leg Sept 28, 2016   left leg from foot to thigh, increasinly worse over last 4 weeks   Essential hypertension 03/30/2018   Gastroesophageal reflux disease without esophagitis 03/11/2017   Hyperlipidemia LDL goal <100 03/16/2017   The 10-year ASCVD risk score Mikey Bussing DC Jr., et al., 2013) is: 30.6%   Values used to calculate the score:     Age: 62 years     Sex: Male     Is Non-Hispanic African American: No     Diabetic: No     Tobacco smoker: No     Systolic Blood Pressure: 627 mmHg     Is BP treated: Yes     HDL Cholesterol: 69.3 mg/dL     Total Cholesterol: 211 mg/dL   Hypertension    Hypoglycemia    Swelling LAST 30 DAYS [facility] AWARE   LEFT LEG AND FOOT   Past Surgical History:  Procedure Laterality Date   BOWEL DECOMPRESSION N/A 03/07/2021   Procedure: BOWEL DECOMPRESSION;  Surgeon: Yetta Flock, MD;  Location: [facility];  Service: Gastroenterology;  Laterality: N/A;   BOWEL  DECOMPRESSION N/A 06/07/2021   Procedure: BOWEL DECOMPRESSION;  Surgeon: Carol Ada, MD;  Location: [facility];  Service: Gastroenterology;  Laterality: N/A;   FLEXIBLE SIGMOIDOSCOPY N/A 03/07/2021   Procedure: FLEXIBLE SIGMOIDOSCOPY;  Surgeon: Yetta Flock, MD;  Location: [facility];  Service: Gastroenterology;  Laterality: N/A;   FLEXIBLE SIGMOIDOSCOPY N/A 06/07/2021   Procedure: FLEXIBLE SIGMOIDOSCOPY;  Surgeon: Carol Ada, MD;  Location: [facility];  Service: Gastroenterology;  Laterality: N/A;   GUM SURGERY     TEETH IMPLANTS ALSO   LAPAROSCOPIC SIGMOID COLECTOMY N/A 06/10/2021   Procedure: LAPAROSCOPIC ASSISTED SIGMOID COLECTOMY;  Surgeon: Johnathan Hausen, MD;  Location: [facility];  Service: General;  Laterality: N/A;   ORCHIECTOMY Bilateral 01/03/2015   Procedure: ORCHIECTOMY;  Surgeon: Alexis Frock, MD;  Location: [facility];  Service: Urology;  Laterality: Bilateral;   Patient Active Problem List   Diagnosis Date Noted   Right foot injury, initial encounter 01/14/2022   Sigmoid volvulus ([place]) 06/07/2021   Generalized abdominal tenderness without rebound tenderness 03/07/2021  Iron deficiency anemia 03/07/2021   Diuretic-induced hypokalemia 12/27/2019   Tinnitus of both ears 09/07/2019   Laryngopharyngeal reflux (LPR) 09/07/2019   Essential hypertension 03/30/2018   Port-A-Cath in place 08/25/2017   Hyperlipidemia LDL goal <100 03/16/2017   Atherosclerosis of aorta ([place]) 03/11/2017   Chronic idiopathic constipation 03/11/2017   Seasonal allergic rhinitis due to pollen 03/11/2017   Gastroesophageal reflux disease without esophagitis 03/11/2017   Malignant neoplasm of bone ([place]) 10/16/2015   Prostate cancer ([place]) 02/02/2015    PCP: [name], MD  REFERRING PROVIDER: [name], MD  REFERRING DIAG: R26.81 (ICD-10-CM) - Gait instability C61 (ICD-10-CM) - Prostate cancer ([place])  THERAPY DIAG:  Unsteadiness on feet  Chronic pain of left knee  Muscle  weakness (generalized)  RATIONALE FOR EVALUATION AND TREATMENT: Rehabilitation  ONSET DATE: 2016   SUBJECTIVE:   SUBJECTIVE STATEMENT: Pt reports the arthrosis in his knee causes him to favor his L knee/LE at times.  PERTINENT HISTORY:  Advanced prostate CA with disease to bone dx 2016, anemia, anxiety  PAIN:  Are you having pain? Yes: NPRS scale: 1/10 Pain location: left knee  Pain description: ache Aggravating factors: being active Relieving factors: tylenol  PRECAUTIONS: Other: Active CA  WEIGHT BEARING RESTRICTIONS: No  FALLS:  Has patient fallen in last 6 months? 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     OBJECTIVE:   DIAGNOSTIC FINDINGS:  Left Knee xray 01/18/21 - No fracture or dislocation of the left knee. Mild medial compartment and patellofemoral arthrosis, with preserved lateral compartment. Probable loose bodies in the posterior joint space. Moderate knee joint effusion. Soft unremarkable.   IMPRESSION: No fracture or dislocation of the left knee. Mild medial compartment and patellofemoral arthrosis, with preserved lateral compartment. Probable loose bodies in the posterior joint space. Moderate knee joint effusion.   PATIENT SURVEYS:  ABC scale 99.38%  COGNITION: Overall cognitive status: Within functional limits for tasks assessed     SENSATION: WFL  EDEMA:  Circumferential: R 41.5 cm  L 44 cm  MUSCLE LENGTH: HS: marked bil tightness Quads: ITB: mod tightness L Piriformis: WNL  Hip Flexors: Heelcords: marked bil   POSTURE: decreased lumbar lordosis and weight shift left  PALPATION: Palpation: TTP at Lt glute  min/med, bil lumbar; mild left ITB. Spinal Mobility: Patellar Mobility: WNL L  LUMBAR ROM:  WFL  except bil rot R 75%, L 50% with less weight shift  LOWER EXTREMITY ROM:  WNL except bil ankle DF limited by tight heel cords.   LOWER EXTREMITY MMT: decreased core strength with MMT.  MMT Right eval Left eval  Hip flexion 4+ 4+*  Hip extension 4 4+  Hip abduction 5 4+  Hip adduction    Hip internal rotation    Hip external rotation    Knee flexion 5 5*  Knee extension 5 5  Ankle dorsiflexion 5 5   (Blank rows = not tested)  *pain  UE MMT:  Bil shoulder flex and ABD 5/5, ext 4+/5  FUNCTIONAL TESTS:  5 times sit to stand: 22.73 with mild pain bil knees Timed up and go (TUG): 11.53 sec (01/31/22) 10 meter walk test: 8.44 (01/31/22) - Gait speed = 3.89 ft/sec Merrilee Jansky  Balance Scale: 52/56 (01/31/22) Functional gait assessment: 25/30 (01/31/22); 25-28 = low risk fall   GAIT: Distance walked: 40 Assistive device utilized: None Level of assistance: Complete Independence Comments: antalgic   TODAY'S TREATMENT:                                                                                                                              DATE:    01/31/22 THERAPEUTIC ACTIVITIES: Balance assessment: TUG 10MWT Berg FGA Stairs: Level of Assistance: Complete Independence Stair Negotiation Technique: Alternating Pattern  with No Rails Number of Stairs: 14  Height of Stairs: 7  Comments: slight hesitation on initiating descent with c/o L knee discomfort  THERAPEUTIC EXERCISE: to improve flexibility, strength and mobility.  Verbal and tactile cues throughout for technique. B shoulder RTB horizontal ABD with cervical and scap retraction into wall 10 x 5" B shoulder RTB horizontal ABD diagonals with cervical and scap retraction into wall 10 x 5" B shoulder RTB rows 10 x 5" B shoulder RTB scap retraction & shoulder extension to neutral 10 x 5"   01/01/22 See Pt Ed   PATIENT  EDUCATION:  Education details: HEP update - postural strengthening and interpretation of balance testing results Person educated: Patient Education method: Explanation, Demonstration, Verbal cues, and Handouts Education comprehension: verbalized understanding and returned demonstration   HOME EXERCISE PROGRAM: Access Code: [email or web address] URL: [email or web address] Date: 01/31/2022 Prepared by: Annie Paras  Exercises - Sit to Stand  - 3 x daily - 7 x weekly - 1-2 sets - 5 reps - Gastroc Stretch on Wall  - 2 x daily - 7 x weekly - 1 sets - 3 reps - 30-60 sec hold - Soleus Stretch on Wall  - 2 x daily - 7 x weekly - 1 sets - 3 reps - 30-60 sec hold - Supine Bridge  - 2 x daily - 7 x weekly - 1-3 sets - 10 reps - Standing Shoulder Diagonal Horizontal Abduction 60/120 Degrees with Resistance  - 1 x daily - 4 x weekly - 2 sets - 10 reps - 3 sec hold - Standing Shoulder Horizontal Abduction with Resistance  - 1 x daily - 4 x weekly - 2 sets - 10 reps - 3 sec hold - Standing Bilateral Low Shoulder Row with Anchored Resistance  - 1 x daily - 4 x weekly - 2 sets - 10 reps - 5 sec hold - Scapular Retraction with Resistance Advanced  - 1 x daily - 4 x weekly - 2 sets - 10 reps - 5 sec hold   ASSESSMENT:  CLINICAL IMPRESSION: Angeles is returning for the first time since his initial eval on 01/01/22 due to multiple other medical appointments and conflicts in the month of November. Balance testing with 10MWT, TUG, Berg and FGA completed today, with Berg and FGA revealing only a low risk for falls. He reports that he has not been as compliant with the HEP as prescribed, typically only  completing the exercises 2x/wk, but denies any issues or need for review today. He continues to note postural and upper body weakness and requested to start working on that today, therefore provided instruction in RTB resisted postural strengthening with HEP updated accordingly. Pieter will benefit from continued  skilled PT to address ongoing strength, postural and activity tolerance deficits to improve mobility and activity tolerance with decreased pain interference.   OBJECTIVE IMPAIRMENTS: Abnormal gait, decreased balance, decreased ROM, decreased strength, increased edema, increased muscle spasms, impaired flexibility, and pain.   ACTIVITY LIMITATIONS: locomotion level  PARTICIPATION LIMITATIONS: yard work  PERSONAL FACTORS: Age and 3+ comorbidities: Advanced prostate CA with disease to bone dx 2016, anemia, anxiety  are also affecting patient's functional outcome.   REHAB POTENTIAL: Good  CLINICAL DECISION MAKING: Evolving/moderate complexity  EVALUATION COMPLEXITY: Low   GOALS: Goals reviewed with patient? Yes  SHORT TERM GOALS: Target date: 01/14/2022   Patient will be independent with initial HEP. Baseline:  Goal status: IN PROGRESS  2.  Patient will demonstrate decreased fall risk by scoring < 25 sec on TUG. Baseline:  Goal status: MET  01/31/22  3.  BERG/TUG/DGI assessed  Baseline:  Goal status: MET  01/31/22  LONG TERM GOALS: Target date: 02/26/2022   Patient will be independent with advanced/ongoing HEP to improve outcomes and carryover.  Baseline:  Goal status: IN PROGRESS  2.  Patient will be able to ambulate 600' with LRAD with good safety to access community.  Baseline:  Goal status: IN PROGRESS  3.  Patient will be able to step up/down curb safely with LRAD for safety with community ambulation.  Baseline:  Goal status: IN PROGRESS   4.  Patient will demonstrate improved functional LE strength as demonstrated by improved 5xSTS to <= 12.6 sec. Baseline:  Goal status: IN PROGRESS  5.  Patient will demonstrate at least 28/30 on FGA to improve gait stability and reduce risk for falls. Baseline: 25/30 (01/31/22) Goal status: IN PROGRESS  6.  Patient will score >/= 55/56 on Berg Balance test to demonstrate lower risk of falls. .  Baseline: 52/56 Goal status: IN  PROGRESS  7.  Pt will report decreased knee pain by >= 50% with gait and ADLS to improve QOL. Baseline:  Goal status: IN PROGRESS  8.  Patient will report improved overall endurance with ADLs by 50%. Baseline:  Goal status: IN PROGRESS    PLAN:  PT FREQUENCY: 2x/week  PT DURATION: 8 weeks  PLANNED INTERVENTIONS: Therapeutic exercises, Therapeutic activity, Neuromuscular re-education, Balance training, Gait training, Patient/Family education, Self Care, Joint mobilization, Stair training, Aquatic Therapy, Dry Needling, Electrical stimulation, Cryotherapy, Taping, Ionotophoresis 29m/ml Dexamethasone, and Manual therapy  PLAN FOR NEXT SESSION: Review and progress HEP for postural strength, general functional strengthening, HS flexibilty,  gait, balance   JPercival Spanish PT 01/31/2022, 12:37 PM

## 2022-02-03 ENCOUNTER — Ambulatory Visit (INDEPENDENT_AMBULATORY_CARE_PROVIDER_SITE_OTHER): Payer: Medicare Other | Admitting: Psychologist

## 2022-02-03 DIAGNOSIS — F411 Generalized anxiety disorder: Secondary | ICD-10-CM

## 2022-02-03 NOTE — Progress Notes (Signed)
[place] Counselor/Therapist Progress Note  Patient ID: Philip [name], MRN: [id number],    Date: 02/03/2022  Time Spent: 09:05 am to 09:45 am; total time: 40 minutes   This session was held via in person. The patient consented to in-person therapy and was in the clinician's office. Limits of confidentiality were discussed with the patient.   Treatment Type: Individual Therapy  Reported Symptoms: Less anxiety  Mental Status Exam: Appearance:  Well Groomed     Behavior: Appropriate  Motor: Normal  Speech/Language:  Clear and Coherent  Affect: Appropriate  Mood: normal  Thought process: normal  Thought content:   WNL  Sensory/Perceptual disturbances:   WNL  Orientation: oriented to person, place, and time/date  Attention: Good  Concentration: Good  Memory: WNL  Fund of knowledge:  Good  Insight:   Good  Judgment:  Good  Impulse Control: Good   Risk Assessment: Danger to Self:  No Self-injurious Behavior: No Danger to Others: No Duty to Warn:no Physical Aggression / Violence:No  Access to Firearms a concern: No  Gang Involvement:No   Subjective: Beginning the session, patient described himself as doing well while reflecting on events since the last session. He talked about his oncologist leaving to go to [facility]. From there, he described his anxiety as better controlled. He proceeded to do a life review of his life. He processed thoughts and emotions. He was agreeable to following up. He denied suicidal and homicidal ideation.    Interventions:  Worked on developing a therapeutic relationship with the patient using active listening and reflective statements. Provided emotional support using empathy and validation. Reviewed the treatment plan with the patient. Used summary statements. Praised the patient for doing so well. Normalized and validated expressed emotions related to his oncologist (Dr. Alen Blew) leaving. Processed thoughts and emotions. Assisted the  patient in doing a life review. Processed thoughts and emotions. Praised patient for his growth. Discussed next steps for counseling. Provided empathic statements.  Assessed for suicidal and homicidal ideation.   Homework: Continue to use coping strategies   Next Session: Review homework and emotional support.  Diagnosis: F41.1 generalized anxiety disorder.   Plan:   Goals Reduce overall frequency, intensity, and duration of anxiety Stabilize anxiety level wile increasing ability to function Enhance ability to effectively cope with full variety of stressors Learn and implement coping skills that result in a reduction of anxiety   Objectives target date for all objectives is 10/15/2022 Verbalize an understanding of the cognitive, physiological, and behavioral components of anxiety Learning and implement calming skills to reduce overall anxiety Verbalize an understanding of the role that cognitive biases play in excessive irrational worry and persistent anxiety symptoms Identify, challenge, and replace based fearful talk Learn and implement problem solving strategies Identify and engage in pleasant activities Learning and implement personal and interpersonal skills to reduce anxiety and improve interpersonal relationships Learn to accept limitations in life and commit to tolerating, rather than avoiding, unpleasant emotions while accomplishing meaningful goals Identify major life conflicts from the past and present that form the basis for present anxiety Maintain involvement in work, family, and social activities Reestablish a consistent sleep-wake cycle Cooperate with a medical evaluation  Interventions Engage the patient in behavioral activation Use instruction, modeling, and role-playing to build the client's general social, communication, and/or conflict resolution skills Use Acceptance and Commitment Therapy to help client accept uncomfortable realities in order to accomplish  value-consistent goals Reinforce the client's insight into the role of his/her past emotional pain and  present anxiety  Support the client in following through with work, family, and social activities Teach and implement sleep hygiene practices  Refer the patient to a physician for a psychotropic medication consultation Monito the clint's psychotropic medication compliance Discuss how anxiety typically involves excessive worry, various bodily expressions of tension, and avoidance of what is threatening that interact to maintain the problem  Teach the patient relaxation skills Assign the patient homework Discuss examples demonstrating that unrealistic worry overestimates the probability of threats and underestimates patient's ability  Assist the patient in analyzing his or her worries Help patient understand that avoidance is reinforcing     The patient and clinician reviewed the treatment plan on 11/19/2021. The patient approved of the treatment plan.   Conception Chancy, PsyD

## 2022-02-05 ENCOUNTER — Encounter: Payer: Self-pay | Admitting: Physical Therapy

## 2022-02-05 ENCOUNTER — Other Ambulatory Visit: Payer: Self-pay | Admitting: Internal Medicine

## 2022-02-05 ENCOUNTER — Ambulatory Visit: Payer: Medicare Other | Admitting: Physical Therapy

## 2022-02-05 DIAGNOSIS — M6281 Muscle weakness (generalized): Secondary | ICD-10-CM | POA: Diagnosis not present

## 2022-02-05 DIAGNOSIS — G8929 Other chronic pain: Secondary | ICD-10-CM

## 2022-02-05 DIAGNOSIS — I700 Atherosclerosis of aorta: Secondary | ICD-10-CM

## 2022-02-05 DIAGNOSIS — E785 Hyperlipidemia, unspecified: Secondary | ICD-10-CM

## 2022-02-05 DIAGNOSIS — R2681 Unsteadiness on feet: Secondary | ICD-10-CM

## 2022-02-05 DIAGNOSIS — M25562 Pain in left knee: Secondary | ICD-10-CM | POA: Diagnosis not present

## 2022-02-05 NOTE — Therapy (Signed)
OUTPATIENT PHYSICAL THERAPY TREATMENT   Patient Name: Philip [name] MRN: [id number] DOB:[birth year], 76 y.o., male Today's Date: 02/05/2022   END OF SESSION:  PT End of Session - 02/05/22 1102     Visit Number 3    Date for PT Re-Evaluation 02/26/22    Authorization Type MCR    Progress Note Due on Visit 10    PT Start Time 1102    PT Stop Time 1144    PT Time Calculation (min) 42 min    Activity Tolerance Patient tolerated treatment well    Behavior During Therapy [facility] for tasks assessed/performed              Past Medical History:  Diagnosis Date   Atherosclerosis of aorta ([place]) 03/11/2017   The 10-year ASCVD risk score Mikey Bussing DC Jr., et al., 2013) is: 24.6%   Values used to calculate the score:     Age: 5 years     Sex: Male     Is Non-Hispanic African American: No     Diabetic: No     Tobacco smoker: No     Systolic Blood Pressure: 338 mmHg     Is BP treated: No     HDL Cholesterol: 71.1 mg/dL     Total Cholesterol: 212 mg/dL   Cancer (HCC)    PROSTATE   Chronic idiopathic constipation 03/11/2017   Edema leg Sept 28, 2016   left leg from foot to thigh, increasinly worse over last 4 weeks   Essential hypertension 03/30/2018   Gastroesophageal reflux disease without esophagitis 03/11/2017   Hyperlipidemia LDL goal <100 03/16/2017   The 10-year ASCVD risk score Mikey Bussing DC Jr., et al., 2013) is: 30.6%   Values used to calculate the score:     Age: 59 years     Sex: Male     Is Non-Hispanic African American: No     Diabetic: No     Tobacco smoker: No     Systolic Blood Pressure: 329 mmHg     Is BP treated: Yes     HDL Cholesterol: 69.3 mg/dL     Total Cholesterol: 211 mg/dL   Hypertension    Hypoglycemia    Swelling LAST 30 DAYS DR [facility] AWARE   LEFT LEG AND FOOT   Past Surgical History:  Procedure Laterality Date   BOWEL DECOMPRESSION N/A 03/07/2021   Procedure: BOWEL DECOMPRESSION;  Surgeon: Yetta Flock, MD;  Location: [facility];  Service: Gastroenterology;  Laterality:  N/A;   BOWEL DECOMPRESSION N/A 06/07/2021   Procedure: BOWEL DECOMPRESSION;  Surgeon: Carol Ada, MD;  Location: [facility];  Service: Gastroenterology;  Laterality: N/A;   FLEXIBLE SIGMOIDOSCOPY N/A 03/07/2021   Procedure: FLEXIBLE SIGMOIDOSCOPY;  Surgeon: Yetta Flock, MD;  Location: [facility];  Service: Gastroenterology;  Laterality: N/A;   FLEXIBLE SIGMOIDOSCOPY N/A 06/07/2021   Procedure: FLEXIBLE SIGMOIDOSCOPY;  Surgeon: Carol Ada, MD;  Location: [facility];  Service: Gastroenterology;  Laterality: N/A;   GUM SURGERY     TEETH IMPLANTS ALSO   LAPAROSCOPIC SIGMOID COLECTOMY N/A 06/10/2021   Procedure: LAPAROSCOPIC ASSISTED SIGMOID COLECTOMY;  Surgeon: Johnathan Hausen, MD;  Location: [facility];  Service: General;  Laterality: N/A;   ORCHIECTOMY Bilateral 01/03/2015   Procedure: ORCHIECTOMY;  Surgeon: Alexis Frock, MD;  Location: [facility];  Service: Urology;  Laterality: Bilateral;   Patient Active Problem List   Diagnosis Date Noted   Right foot injury, initial encounter 01/14/2022   Sigmoid volvulus ([place]) 06/07/2021   Generalized abdominal tenderness  without rebound tenderness 03/07/2021   Iron deficiency anemia 03/07/2021   Diuretic-induced hypokalemia 12/27/2019   Tinnitus of both ears 09/07/2019   Laryngopharyngeal reflux (LPR) 09/07/2019   Essential hypertension 03/30/2018   Port-A-Cath in place 08/25/2017   Hyperlipidemia LDL goal <100 03/16/2017   Atherosclerosis of aorta ([place]) 03/11/2017   Chronic idiopathic constipation 03/11/2017   Seasonal allergic rhinitis due to pollen 03/11/2017   Gastroesophageal reflux disease without esophagitis 03/11/2017   Malignant neoplasm of bone ([place]) 10/16/2015   Prostate cancer ([place]) 02/02/2015    PCP: [name], MD  REFERRING PROVIDER: [name], MD  REFERRING DIAG: R26.81 (ICD-10-CM) - Gait instability C61 (ICD-10-CM) - Prostate cancer ([place])  THERAPY DIAG:  Unsteadiness on feet  Chronic pain of left  knee  Muscle weakness (generalized)  RATIONALE FOR EVALUATION AND TREATMENT: Rehabilitation  ONSET DATE: 2016  NEXT MD APPOINTMENT: 03/19/22   SUBJECTIVE:   SUBJECTIVE STATEMENT: Pt reports his L knee is acting up on his today.Marland Kitchen  PERTINENT HISTORY:  Advanced prostate CA with disease to bone dx 2016, anemia, anxiety  PAIN:  Are you having pain? Yes: NPRS scale:  4/10 Pain location: left knee  Pain description: ache Aggravating factors: being active Relieving factors: tylenol  PRECAUTIONS: Other: Active CA  WEIGHT BEARING RESTRICTIONS: No  FALLS:  Has patient fallen in last 6 months? 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     OBJECTIVE:   DIAGNOSTIC FINDINGS:  Left Knee xray 01/18/21 - No fracture or dislocation of the left knee. Mild medial compartment and patellofemoral arthrosis, with preserved lateral compartment. Probable loose bodies in the posterior joint space. Moderate knee joint effusion. Soft unremarkable.   IMPRESSION: No fracture or dislocation of the left knee. Mild medial compartment and patellofemoral arthrosis, with preserved lateral compartment. Probable loose bodies in the posterior joint space. Moderate knee joint effusion.   PATIENT SURVEYS:  ABC scale 99.38%  COGNITION: Overall cognitive status: Within functional limits for tasks assessed     SENSATION: WFL  EDEMA:  Circumferential: R 41.5 cm  L 44 cm  MUSCLE LENGTH: HS: marked bil tightness Quads: ITB: mod tightness L Piriformis: WNL  Hip Flexors: Heelcords: marked bil   POSTURE: decreased lumbar lordosis and weight shift left  PALPATION: Palpation: TTP  at Lt glute min/med, bil lumbar; mild left ITB. Spinal Mobility: Patellar Mobility: WNL L  LUMBAR ROM:  WFL  except bil rot R 75%, L 50% with less weight shift  LOWER EXTREMITY ROM:  WNL except bil ankle DF limited by tight heel cords.   LOWER EXTREMITY MMT: decreased core strength with MMT.  MMT Right eval Left eval  Hip flexion 4+ 4+*  Hip extension 4 4+  Hip abduction 5 4+  Hip adduction    Hip internal rotation    Hip external rotation    Knee flexion 5 5*  Knee extension 5 5  Ankle dorsiflexion 5 5   (Blank rows = not tested)  *pain  UE MMT:  Bil shoulder flex and ABD 5/5, ext 4+/5  FUNCTIONAL TESTS:  5 times sit to stand: 22.73 with mild pain bil knees Timed up and go (TUG): 11.53 sec (01/31/22) 10 meter walk test: 8.44 (01/31/22) -  Gait speed = 3.89 ft/sec Berg Balance Scale: 52/56 (01/31/22) Functional gait assessment: 25/30 (01/31/22); 25-28 = low risk fall   GAIT: Distance walked: 40 Assistive device utilized: None Level of assistance: Complete Independence Comments: antalgic   TODAY'S TREATMENT:                                                                                                                              DATE:    02/05/22 THERAPEUTIC EXERCISE: to improve flexibility, strength and mobility.  Verbal and tactile cues throughout for technique. UBE L2.0 x 6 min (3' each forward and back) Standing R/L 4-way hip SLR with GTB at ankle x 10, intermittent UE support on back of chair Prone I's, T's, Y's & W's + scap retraction and thoracic/cervical extension over red Pball x 10 each - PT providing intermittent stabilization of ball    01/31/22 THERAPEUTIC ACTIVITIES: Balance assessment: TUG 10MWT Berg FGA Stairs: Level of Assistance: Complete Independence Stair Negotiation Technique: Alternating Pattern  with No Rails Number of Stairs: 14  Height of Stairs: 7  Comments: slight hesitation on initiating descent with c/o L knee  discomfort  THERAPEUTIC EXERCISE: to improve flexibility, strength and mobility.  Verbal and tactile cues throughout for technique. B shoulder RTB horizontal ABD with cervical and scap retraction into wall 10 x 5" B shoulder RTB horizontal ABD diagonals with cervical and scap retraction into wall 10 x 5" B shoulder RTB rows 10 x 5" B shoulder RTB scap retraction & shoulder extension to neutral 10 x 5"   01/01/22 See Pt Ed   PATIENT EDUCATION:  Education details: HEP update - postural strengthening and interpretation of balance testing results Person educated: Patient Education method: Explanation, Demonstration, Verbal cues, and Handouts Education comprehension: verbalized understanding and returned demonstration   HOME EXERCISE PROGRAM: Access Code: [email or web address] URL: [email or web address] Date: 02/05/2022 Prepared by: Annie Paras  Exercises - Sit to Stand  - 3 x daily - 7 x weekly - 1-2 sets - 5 reps - Gastroc Stretch on Wall  - 2 x daily - 7 x weekly - 1 sets - 3 reps - 30-60 sec hold - Soleus Stretch on Wall  - 2 x daily - 7 x weekly - 1 sets - 3 reps - 30-60 sec hold - Supine Bridge  - 2 x daily - 7 x weekly - 1-3 sets - 10 reps - Standing Shoulder Diagonal Horizontal Abduction 60/120 Degrees with Resistance  - 1 x daily - 4 x weekly - 2 sets - 10 reps - 3 sec hold - Standing Shoulder Horizontal Abduction with Resistance  - 1 x daily - 4 x weekly - 2 sets - 10 reps - 3 sec hold - Standing Bilateral Low Shoulder Row with Anchored Resistance  - 1 x daily - 4 x weekly - 2 sets - 10 reps - 5 sec hold - Scapular Retraction with Resistance Advanced  - 1 x daily - 4 x weekly -  2 sets - 10 reps - 5 sec hold - Standing Hip Flexion with Anchored Resistance and Chair Support  - 1 x daily - 4 x weekly - 2 sets - 10 reps - 3 sec hold - Standing Hip Adduction with Anchored Resistance  - 1 x daily - 4 x weekly - 2 sets - 10 reps - 3 sec hold - Standing Hip Extension with Anchored  Resistance  - 1 x daily - 4 x weekly - 2 sets - 10 reps - 3 sec hold - Standing Hip Abduction with Anchored Resistance  - 1 x daily - 4 x weekly - 2 sets - 10 reps - 3 sec hold  ASSESSMENT:  CLINICAL IMPRESSION: Ohm reports no concerns with the HEP update from last week but notes uncertainty regarding positioning of his head with the bridges from the initial HEP - clarified that it is acceptable to have some support under head/neck for comfort. STG #1 met.  Progressed lower extremity strengthening and balance training with introduction of SLS + 4-way hip resisted SLR in standing with intermittent upper extremity support on back of chair.  Patient having more difficulty during SLS on left leg due to knee discomfort, as well as difficulty isolating hip extension without HS curl.  Progressed upper thoracic and shoulder strengthening with prone exercises over physioball.  Patient requiring assistance from PT to stabilize ball.  HEP updated to include hip strengthening exercises at patient request, but deferred adding physioball exercises to HEP at this time.  OBJECTIVE IMPAIRMENTS: Abnormal gait, decreased balance, decreased ROM, decreased strength, increased edema, increased muscle spasms, impaired flexibility, and pain.   ACTIVITY LIMITATIONS: locomotion level  PARTICIPATION LIMITATIONS: yard work  PERSONAL FACTORS: Age and 3+ comorbidities: Advanced prostate CA with disease to bone dx 2016, anemia, anxiety  are also affecting patient's functional outcome.   REHAB POTENTIAL: Good  CLINICAL DECISION MAKING: Evolving/moderate complexity  EVALUATION COMPLEXITY: Low   GOALS: Goals reviewed with patient? Yes  SHORT TERM GOALS: Target date: 01/14/2022   Patient will be independent with initial HEP. Baseline:  Goal status: MET  02/05/22  2.  Patient will demonstrate decreased fall risk by scoring < 25 sec on TUG. Baseline:  Goal status: MET  01/31/22  3.  BERG/TUG/DGI assessed  Baseline:   Goal status: MET  01/31/22  LONG TERM GOALS: Target date: 02/26/2022   Patient will be independent with advanced/ongoing HEP to improve outcomes and carryover.  Baseline:  Goal status: IN PROGRESS  2.  Patient will be able to ambulate 600' with LRAD with good safety to access community.  Baseline:  Goal status: IN PROGRESS  3.  Patient will be able to step up/down curb safely with LRAD for safety with community ambulation.  Baseline:  Goal status: IN PROGRESS   4.  Patient will demonstrate improved functional LE strength as demonstrated by improved 5xSTS to <= 12.6 sec. Baseline:  Goal status: IN PROGRESS  5.  Patient will demonstrate at least 28/30 on FGA to improve gait stability and reduce risk for falls. Baseline: 25/30 (01/31/22) Goal status: IN PROGRESS  6.  Patient will score >/= 55/56 on Berg Balance test to demonstrate lower risk of falls. .  Baseline: 52/56 Goal status: IN PROGRESS  7.  Pt will report decreased knee pain by >= 50% with gait and ADLS to improve QOL. Baseline:  Goal status: IN PROGRESS  8.  Patient will report improved overall endurance with ADLs by 50%. Baseline:  Goal status: IN PROGRESS  PLAN:  PT FREQUENCY: 2x/week  PT DURATION: 8 weeks  PLANNED INTERVENTIONS: Therapeutic exercises, Therapeutic activity, Neuromuscular re-education, Balance training, Gait training, Patient/Family education, Self Care, Joint mobilization, Stair training, Aquatic Therapy, Dry Needling, Electrical stimulation, Cryotherapy, Taping, Ionotophoresis 53m/ml Dexamethasone, and Manual therapy  PLAN FOR NEXT SESSION: Review and progress HEP for postural strength, general functional strengthening, HS flexibilty,  gait, balance   JPercival Spanish PT 02/05/2022, 12:24 PM

## 2022-02-07 ENCOUNTER — Encounter: Payer: Self-pay | Admitting: Physical Therapy

## 2022-02-07 ENCOUNTER — Ambulatory Visit: Payer: Medicare Other | Admitting: Physical Therapy

## 2022-02-07 DIAGNOSIS — G8929 Other chronic pain: Secondary | ICD-10-CM

## 2022-02-07 DIAGNOSIS — M25562 Pain in left knee: Secondary | ICD-10-CM | POA: Diagnosis not present

## 2022-02-07 DIAGNOSIS — R2681 Unsteadiness on feet: Secondary | ICD-10-CM | POA: Diagnosis not present

## 2022-02-07 DIAGNOSIS — M6281 Muscle weakness (generalized): Secondary | ICD-10-CM | POA: Diagnosis not present

## 2022-02-07 NOTE — Therapy (Signed)
OUTPATIENT PHYSICAL THERAPY TREATMENT   Patient Name: Philip [name] MRN: [id number] DOB:Oct 16, 1945, 76 y.o., male Today's Date: 02/07/2022   END OF SESSION:  PT End of Session - 02/07/22 1015     Visit Number 4    Date for PT Re-Evaluation 02/26/22    Authorization Type MCR    Progress Note Due on Visit 10    PT Start Time 1015    PT Stop Time 1100    PT Time Calculation (min) 45 min    Activity Tolerance Patient tolerated treatment well    Behavior During Therapy [facility] for tasks assessed/performed              Past Medical History:  Diagnosis Date   Atherosclerosis of aorta ([place]) 03/11/2017   The 10-year ASCVD risk score Mikey Bussing DC Jr., et al., 2013) is: 24.6%   Values used to calculate the score:     Age: 57 years     Sex: Male     Is Non-Hispanic African American: No     Diabetic: No     Tobacco smoker: No     Systolic Blood Pressure: 062 mmHg     Is BP treated: No     HDL Cholesterol: 71.1 mg/dL     Total Cholesterol: 212 mg/dL   Cancer (HCC)    PROSTATE   Chronic idiopathic constipation 03/11/2017   Edema leg Sept 28, 2016   left leg from foot to thigh, increasinly worse over last 4 weeks   Essential hypertension 03/30/2018   Gastroesophageal reflux disease without esophagitis 03/11/2017   Hyperlipidemia LDL goal <100 03/16/2017   The 10-year ASCVD risk score Mikey Bussing DC Jr., et al., 2013) is: 30.6%   Values used to calculate the score:     Age: 31 years     Sex: Male     Is Non-Hispanic African American: No     Diabetic: No     Tobacco smoker: No     Systolic Blood Pressure: 376 mmHg     Is BP treated: Yes     HDL Cholesterol: 69.3 mg/dL     Total Cholesterol: 211 mg/dL   Hypertension    Hypoglycemia    Swelling LAST 30 DAYS DR [facility] AWARE   LEFT LEG AND FOOT   Past Surgical History:  Procedure Laterality Date   BOWEL DECOMPRESSION N/A 03/07/2021   Procedure: BOWEL DECOMPRESSION;  Surgeon: Yetta Flock, MD;  Location: [facility];  Service: Gastroenterology;  Laterality:  N/A;   BOWEL DECOMPRESSION N/A 06/07/2021   Procedure: BOWEL DECOMPRESSION;  Surgeon: Carol Ada, MD;  Location: [facility];  Service: Gastroenterology;  Laterality: N/A;   FLEXIBLE SIGMOIDOSCOPY N/A 03/07/2021   Procedure: FLEXIBLE SIGMOIDOSCOPY;  Surgeon: Yetta Flock, MD;  Location: [facility];  Service: Gastroenterology;  Laterality: N/A;   FLEXIBLE SIGMOIDOSCOPY N/A 06/07/2021   Procedure: FLEXIBLE SIGMOIDOSCOPY;  Surgeon: Carol Ada, MD;  Location: [facility];  Service: Gastroenterology;  Laterality: N/A;   GUM SURGERY     TEETH IMPLANTS ALSO   LAPAROSCOPIC SIGMOID COLECTOMY N/A 06/10/2021   Procedure: LAPAROSCOPIC ASSISTED SIGMOID COLECTOMY;  Surgeon: Johnathan Hausen, MD;  Location: [facility];  Service: General;  Laterality: N/A;   ORCHIECTOMY Bilateral 01/03/2015   Procedure: ORCHIECTOMY;  Surgeon: Alexis Frock, MD;  Location: [facility];  Service: Urology;  Laterality: Bilateral;   Patient Active Problem List   Diagnosis Date Noted   Right foot injury, initial encounter 01/14/2022   Sigmoid volvulus ([place]) 06/07/2021   Generalized abdominal tenderness  without rebound tenderness 03/07/2021   Iron deficiency anemia 03/07/2021   Diuretic-induced hypokalemia 12/27/2019   Tinnitus of both ears 09/07/2019   Laryngopharyngeal reflux (LPR) 09/07/2019   Essential hypertension 03/30/2018   Port-A-Cath in place 08/25/2017   Hyperlipidemia LDL goal <100 03/16/2017   Atherosclerosis of aorta ([place]) 03/11/2017   Chronic idiopathic constipation 03/11/2017   Seasonal allergic rhinitis due to pollen 03/11/2017   Gastroesophageal reflux disease without esophagitis 03/11/2017   Malignant neoplasm of bone ([place]) 10/16/2015   Prostate cancer ([place]) 02/02/2015    PCP: [name], MD  REFERRING PROVIDER: [name], MD  REFERRING DIAG: R26.81 (ICD-10-CM) - Gait instability C61 (ICD-10-CM) - Prostate cancer ([place])  THERAPY DIAG:  Unsteadiness on feet  Chronic pain of left  knee  Muscle weakness (generalized)  RATIONALE FOR EVALUATION AND TREATMENT: Rehabilitation  ONSET DATE: 2016  NEXT MD APPOINTMENT: 03/19/22   SUBJECTIVE:   SUBJECTIVE STATEMENT: Pt reports pain in knee today. HEP   PERTINENT HISTORY:  Advanced prostate CA with disease to bone dx 2016, anemia, anxiety  PAIN:  Are you having pain? Yes: NPRS scale: 2/10 Pain location: left knee  Pain description: ache Aggravating factors: being active Relieving factors: tylenol  PRECAUTIONS: Other: Active CA  WEIGHT BEARING RESTRICTIONS: No  FALLS:  Has patient fallen in last 6 months? 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     OBJECTIVE:   DIAGNOSTIC FINDINGS:  Left Knee xray 01/18/21 - No fracture or dislocation of the left knee. Mild medial compartment and patellofemoral arthrosis, with preserved lateral compartment. Probable loose bodies in the posterior joint space. Moderate knee joint effusion. Soft unremarkable.   IMPRESSION: No fracture or dislocation of the left knee. Mild medial compartment and patellofemoral arthrosis, with preserved lateral compartment. Probable loose bodies in the posterior joint space. Moderate knee joint effusion.   PATIENT SURVEYS:  ABC scale 99.38%  COGNITION: Overall cognitive status: Within functional limits for tasks assessed     SENSATION: WFL  EDEMA:  Circumferential: R 41.5 cm  L 44 cm  MUSCLE LENGTH: HS: marked bil tightness Quads: ITB: mod tightness L Piriformis: WNL  Hip Flexors: Heelcords: marked bil   POSTURE: decreased lumbar lordosis and weight shift left  PALPATION: Palpation: TTP at Lt glute  min/med, bil lumbar; mild left ITB. Spinal Mobility: Patellar Mobility: WNL L  LUMBAR ROM:  WFL  except bil rot R 75%, L 50% with less weight shift  LOWER EXTREMITY ROM:  WNL except bil ankle DF limited by tight heel cords.   LOWER EXTREMITY MMT: decreased core strength with MMT.  MMT Right eval Left eval  Hip flexion 4+ 4+*  Hip extension 4 4+  Hip abduction 5 4+  Hip adduction    Hip internal rotation    Hip external rotation    Knee flexion 5 5*  Knee extension 5 5  Ankle dorsiflexion 5 5   (Blank rows = not tested)  *pain  UE MMT:  Bil shoulder flex and ABD 5/5, ext 4+/5  FUNCTIONAL TESTS:  5 times sit to stand: 22.73 with mild pain bil knees Timed up and go (TUG): 11.53 sec (01/31/22) 10 meter walk test: 8.44 (01/31/22) - Gait speed =  3.89 ft/sec Berg Balance Scale: 52/56 (01/31/22) Functional gait assessment: 25/30 (01/31/22); 25-28 = low risk fall   GAIT: Distance walked: 40 Assistive device utilized: None Level of assistance: Complete Independence Comments: antalgic   TODAY'S TREATMENT:                                                                                                                              DATE:   02/07/22 THERAPEUTIC EXERCISE: to improve flexibility, strength and mobility.  Verbal and tactile cues throughout for technique. NuStep L4x6 min Seated HS Stretch R/L 2x30 sec hold Standing hip abduction walks at counter RTB- 4x10 ft Standing hip extension at counter RTB R/L 2x10 Forward Step up 6" R/L 1x10 each Step Up and Over 6" R/L 1x10 each Standing Shoulder Horizontal Abduction RTB 3x10- at doorframe for tactile cues Standing Shoulder R/L Diagonals RTB 2x10- at doorframe for tactile cues Standing shoulder ER R/L RTB 2x10  NEUROMUSCULAR RE-EDUCATION: To improve balance and reduce fall risk.  Standing airex static 2x30 sec Standing airex with horizontal/vertical HT 2x30 sec each Standing airex with EC, 2x30 sce, 1 LOB forward on 1st  set    02/05/22 THERAPEUTIC EXERCISE: to improve flexibility, strength and mobility.  Verbal and tactile cues throughout for technique. UBE L2.0 x 6 min (3' each forward and back) Standing R/L 4-way hip SLR with GTB at ankle x 10, intermittent UE support on back of chair Prone I's, T's, Y's & W's + scap retraction and thoracic/cervical extension over red Pball x 10 each - PT providing intermittent stabilization of ball    01/31/22 THERAPEUTIC ACTIVITIES: Balance assessment: TUG 10MWT Berg FGA Stairs: Level of Assistance: Complete Independence Stair Negotiation Technique: Alternating Pattern  with No Rails Number of Stairs: 14  Height of Stairs: 7  Comments: slight hesitation on initiating descent with c/o L knee discomfort  THERAPEUTIC EXERCISE: to improve flexibility, strength and mobility.  Verbal and tactile cues throughout for technique. B shoulder RTB horizontal ABD with cervical and scap retraction into wall 10 x 5" B shoulder RTB horizontal ABD diagonals with cervical and scap retraction into wall 10 x 5" B shoulder RTB rows 10 x 5" B shoulder RTB scap retraction & shoulder extension to neutral 10 x 5"   01/01/22 See Pt Ed   PATIENT EDUCATION:  Education details: HEP update - postural strengthening and interpretation of balance testing results Person educated: Patient Education method: Explanation, Demonstration, Verbal cues, and Handouts Education comprehension: verbalized understanding and returned demonstration   HOME EXERCISE PROGRAM: Access Code: [email or web address] URL: [email or web address] Date: 02/07/2022 Prepared by: Zeb Comfort  Exercises - Sit to Stand  - 3 x daily - 7 x weekly - 1-2 sets - 5 reps - Gastroc Stretch on Wall  - 2 x daily - 7 x weekly - 1 sets - 3 reps - 30-60 sec hold - Soleus Stretch on Wall  - 2 x daily - 7 x weekly - 1 sets -  3 reps - 30-60 sec hold - Supine Bridge  - 2 x daily - 7 x weekly - 1-3 sets - 10 reps - Standing  Shoulder Diagonal Horizontal Abduction 60/120 Degrees with Resistance  - 1 x daily - 4 x weekly - 2 sets - 10 reps - 3 sec hold - Standing Shoulder Horizontal Abduction with Resistance  - 1 x daily - 4 x weekly - 2 sets - 10 reps - 3 sec hold - Standing Bilateral Low Shoulder Row with Anchored Resistance  - 1 x daily - 4 x weekly - 2 sets - 10 reps - 5 sec hold - Scapular Retraction with Resistance Advanced  - 1 x daily - 4 x weekly - 2 sets - 10 reps - 5 sec hold - Standing Hip Flexion with Anchored Resistance and Chair Support  - 1 x daily - 4 x weekly - 2 sets - 10 reps - 3 sec hold - Standing Hip Adduction with Anchored Resistance  - 1 x daily - 4 x weekly - 2 sets - 10 reps - 3 sec hold - Standing Hip Extension with Anchored Resistance  - 1 x daily - 4 x weekly - 2 sets - 10 reps - 3 sec hold - Standing Hip Abduction with Anchored Resistance  - 1 x daily - 4 x weekly - 2 sets - 10 reps - 3 sec hold - Wall Push Up  - 1 x daily - 4 x weekly - 3 sets - 10 reps - Side Stepping with Resistance at Ankles  - 1 x daily - 4 x weekly - 3 sets - 10 reps  ASSESSMENT:  CLINICAL IMPRESSION: Birney reports today with slight knee pain but states his HEP is going well and has no other concerns. He was able to progress LE strengthening exercises and static/dynamic balance on foam surfaces but would still continue to benefit from closed chain LE strengthening and dynamic balance exercises due to occasional LOB. This session focused on TE to improve LE strength and scapular retraction activation and NMR to improve balance on uneven surfaces. He will continue to benefit from skilled therapy to address stated impairments.    OBJECTIVE IMPAIRMENTS: Abnormal gait, decreased balance, decreased ROM, decreased strength, increased edema, increased muscle spasms, impaired flexibility, and pain.   ACTIVITY LIMITATIONS: locomotion level  PARTICIPATION LIMITATIONS: yard work  PERSONAL FACTORS: Age and 3+ comorbidities:  Advanced prostate CA with disease to bone dx 2016, anemia, anxiety  are also affecting patient's functional outcome.   REHAB POTENTIAL: Good  CLINICAL DECISION MAKING: Evolving/moderate complexity  EVALUATION COMPLEXITY: Low   GOALS: Goals reviewed with patient? Yes  SHORT TERM GOALS: Target date: 01/14/2022   Patient will be independent with initial HEP. Baseline:  Goal status: MET  02/05/22  2.  Patient will demonstrate decreased fall risk by scoring < 25 sec on TUG. Baseline:  Goal status: MET  01/31/22  3.  BERG/TUG/DGI assessed  Baseline:  Goal status: MET  01/31/22  LONG TERM GOALS: Target date: 02/26/2022   Patient will be independent with advanced/ongoing HEP to improve outcomes and carryover.  Baseline:  Goal status: IN PROGRESS  2.  Patient will be able to ambulate 600' with LRAD with good safety to access community.  Baseline:  Goal status: IN PROGRESS  3.  Patient will be able to step up/down curb safely with LRAD for safety with community ambulation.  Baseline:  Goal status: IN PROGRESS   4.  Patient will demonstrate improved functional  LE strength as demonstrated by improved 5xSTS to <= 12.6 sec. Baseline:  Goal status: IN PROGRESS  5.  Patient will demonstrate at least 28/30 on FGA to improve gait stability and reduce risk for falls. Baseline: 25/30 (01/31/22) Goal status: IN PROGRESS  6.  Patient will score >/= 55/56 on Berg Balance test to demonstrate lower risk of falls. .  Baseline: 52/56 Goal status: IN PROGRESS  7.  Pt will report decreased knee pain by >= 50% with gait and ADLS to improve QOL. Baseline:  Goal status: IN PROGRESS  8.  Patient will report improved overall endurance with ADLs by 50%. Baseline:  Goal status: IN PROGRESS    PLAN:  PT FREQUENCY: 2x/week  PT DURATION: 8 weeks  PLANNED INTERVENTIONS: Therapeutic exercises, Therapeutic activity, Neuromuscular re-education, Balance training, Gait training, Patient/Family  education, Self Care, Joint mobilization, Stair training, Aquatic Therapy, Dry Needling, Electrical stimulation, Cryotherapy, Taping, Ionotophoresis 67m/ml Dexamethasone, and Manual therapy  PLAN FOR NEXT SESSION: Review and progress HEP for postural strength, general functional strengthening, HS flexibilty,  gait, balance   OZeb Comfort Student-PT 02/07/2022, 11:01 AM

## 2022-02-11 ENCOUNTER — Encounter: Payer: Medicare Other | Admitting: Physical Therapy

## 2022-02-12 ENCOUNTER — Encounter: Payer: Self-pay | Admitting: Physical Therapy

## 2022-02-12 ENCOUNTER — Ambulatory Visit: Payer: Medicare Other | Admitting: Physical Therapy

## 2022-02-12 DIAGNOSIS — M25562 Pain in left knee: Secondary | ICD-10-CM | POA: Diagnosis not present

## 2022-02-12 DIAGNOSIS — R2681 Unsteadiness on feet: Secondary | ICD-10-CM | POA: Diagnosis not present

## 2022-02-12 DIAGNOSIS — G8929 Other chronic pain: Secondary | ICD-10-CM | POA: Diagnosis not present

## 2022-02-12 DIAGNOSIS — M6281 Muscle weakness (generalized): Secondary | ICD-10-CM

## 2022-02-12 NOTE — Therapy (Signed)
OUTPATIENT PHYSICAL THERAPY TREATMENT   Patient Name: Philip [name] MRN: [id number] DOB:Oct 23, 1945, 76 y.o., male Today's Date: 02/12/2022  Progress Note  Reporting Period 03/12/22 to 04/09/22  See note below for Objective Data and Assessment of Progress/Goals.      END OF SESSION:  PT End of Session - 02/12/22 0928     Visit Number 5    Date for PT Re-Evaluation 04/09/22    Authorization Type MCR    Progress Note Due on Visit 15   Recert/PN on visit #5 - 02/12/22   PT Start Time 0928    PT Stop Time 1015    PT Time Calculation (min) 47 min    Activity Tolerance Patient tolerated treatment well    Behavior During Therapy [facility] for tasks assessed/performed               Past Medical History:  Diagnosis Date   Atherosclerosis of aorta ([place]) 03/11/2017   The 10-year ASCVD risk score Mikey Bussing DC Jr., et al., 2013) is: 24.6%   Values used to calculate the score:     Age: 47 years     Sex: Male     Is Non-Hispanic African American: No     Diabetic: No     Tobacco smoker: No     Systolic Blood Pressure: 488 mmHg     Is BP treated: No     HDL Cholesterol: 71.1 mg/dL     Total Cholesterol: 212 mg/dL   Cancer (HCC)    PROSTATE   Chronic idiopathic constipation 03/11/2017   Edema leg Sept 28, 2016   left leg from foot to thigh, increasinly worse over last 4 weeks   Essential hypertension 03/30/2018   Gastroesophageal reflux disease without esophagitis 03/11/2017   Hyperlipidemia LDL goal <100 03/16/2017   The 10-year ASCVD risk score Mikey Bussing DC Jr., et al., 2013) is: 30.6%   Values used to calculate the score:     Age: 72 years     Sex: Male     Is Non-Hispanic African American: No     Diabetic: No     Tobacco smoker: No     Systolic Blood Pressure: 891 mmHg     Is BP treated: Yes     HDL Cholesterol: 69.3 mg/dL     Total Cholesterol: 211 mg/dL   Hypertension    Hypoglycemia    Swelling LAST 30 DAYS DR [facility] AWARE   LEFT LEG AND FOOT   Past Surgical History:  Procedure Laterality Date    BOWEL DECOMPRESSION N/A 03/07/2021   Procedure: BOWEL DECOMPRESSION;  Surgeon: Yetta Flock, MD;  Location: [facility];  Service: Gastroenterology;  Laterality: N/A;   BOWEL DECOMPRESSION N/A 06/07/2021   Procedure: BOWEL DECOMPRESSION;  Surgeon: Carol Ada, MD;  Location: [facility];  Service: Gastroenterology;  Laterality: N/A;   FLEXIBLE SIGMOIDOSCOPY N/A 03/07/2021   Procedure: FLEXIBLE SIGMOIDOSCOPY;  Surgeon: Yetta Flock, MD;  Location: [facility];  Service: Gastroenterology;  Laterality: N/A;   FLEXIBLE SIGMOIDOSCOPY N/A 06/07/2021   Procedure: FLEXIBLE SIGMOIDOSCOPY;  Surgeon: Carol Ada, MD;  Location: [facility];  Service: Gastroenterology;  Laterality: N/A;   GUM SURGERY     TEETH IMPLANTS ALSO   LAPAROSCOPIC SIGMOID COLECTOMY N/A 06/10/2021   Procedure: LAPAROSCOPIC ASSISTED SIGMOID COLECTOMY;  Surgeon: Johnathan Hausen, MD;  Location: [facility];  Service: General;  Laterality: N/A;   ORCHIECTOMY Bilateral 01/03/2015   Procedure: ORCHIECTOMY;  Surgeon: Alexis Frock, MD;  Location: [facility];  Service: Urology;  Laterality:  Bilateral;   Patient Active Problem List   Diagnosis Date Noted   Right foot injury, initial encounter 01/14/2022   Sigmoid volvulus ([place]) 06/07/2021   Generalized abdominal tenderness without rebound tenderness 03/07/2021   Iron deficiency anemia 03/07/2021   Diuretic-induced hypokalemia 12/27/2019   Tinnitus of both ears 09/07/2019   Laryngopharyngeal reflux (LPR) 09/07/2019   Essential hypertension 03/30/2018   Port-A-Cath in place 08/25/2017   Hyperlipidemia LDL goal <100 03/16/2017   Atherosclerosis of aorta ([place]) 03/11/2017   Chronic idiopathic constipation 03/11/2017   Seasonal allergic rhinitis due to pollen 03/11/2017   Gastroesophageal reflux disease without esophagitis 03/11/2017   Malignant neoplasm of bone ([place]) 10/16/2015   Prostate cancer ([place]) 02/02/2015    PCP: [name], MD  REFERRING PROVIDER: [name], MD  REFERRING DIAG: R26.81 (ICD-10-CM) - Gait instability C61 (ICD-10-CM) - Prostate cancer ([place])  THERAPY DIAG:  Unsteadiness on feet  Chronic pain of left knee  Muscle weakness (generalized)  RATIONALE FOR EVALUATION AND TREATMENT: Rehabilitation  ONSET DATE: 2016  NEXT MD APPOINTMENT: 03/19/22   SUBJECTIVE:   SUBJECTIVE STATEMENT: Pt reports slight knee pain today but nothing abnormal.  PERTINENT HISTORY:  Advanced prostate CA with disease to bone dx 2016, anemia, anxiety  PAIN:  Are you having pain? Yes: NPRS scale: 1/10 Pain location: left knee  Pain description: ache Aggravating factors: being active Relieving factors: tylenol  PRECAUTIONS: Other: Active CA  WEIGHT BEARING RESTRICTIONS: No  FALLS:  Has patient fallen in last 6 months? 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     OBJECTIVE:   DIAGNOSTIC FINDINGS:  Left Knee xray 01/18/21 - No fracture or dislocation of the left knee. Mild medial compartment and patellofemoral arthrosis, with preserved lateral compartment. Probable loose bodies in the posterior joint space. Moderate knee joint effusion. Soft unremarkable.   IMPRESSION: No fracture or dislocation of the left knee. Mild medial compartment and patellofemoral arthrosis, with preserved lateral compartment. Probable loose bodies in the posterior joint space. Moderate knee joint effusion.   PATIENT SURVEYS:  ABC scale 99.38%  COGNITION: Overall cognitive status: Within functional limits for tasks assessed     SENSATION: WFL  EDEMA:  Circumferential: R 41.5 cm  L 44 cm  MUSCLE LENGTH: HS: marked bil  tightness Quads: ITB: mod tightness L Piriformis: WNL  Hip Flexors: Heelcords: marked bil  POSTURE: decreased lumbar lordosis and weight shift left  PALPATION: Palpation: TTP at Lt glute min/med, bil lumbar; mild left ITB. Spinal Mobility: Patellar Mobility: WNL L  LUMBAR ROM:  WFL  except bil rot R 75%, L 50% with less weight shift  LOWER EXTREMITY ROM:  WNL except bil ankle DF limited by tight heel cords.  LOWER EXTREMITY MMT: decreased core strength with MMT.  MMT Right eval Left eval Right 02/12/22 Left 02/12/22  Hip flexion 4+ 4+* 4+ 4+  Hip extension 4 4+ 4+ 4+  Hip abduction 5 4+ 5 5  Hip adduction      Hip internal rotation      Hip external rotation      Knee flexion 5 5* 5 5*  Knee extension _0 Ankle dorsiflexion _1 5*   (  Blank rows = not tested)  *pain  UE MMT:  Bil shoulder flex and ABD 5/5, ext 4+/5  FUNCTIONAL TESTS:  5 times sit to stand: 22.73 with mild pain bil knees Timed up and go (TUG): 11.53 sec (01/31/22) 10 meter walk test: 8.44 (01/31/22) - Gait speed = 3.89 ft/sec Berg Balance Scale: 52/56 (01/31/22) Functional gait assessment: 25/30 (01/31/22); 25-28 = low risk fall   GAIT: Distance walked: 40 Assistive device utilized: None Level of assistance: Complete Independence Comments: antalgic   TODAY'S TREATMENT:                                                                                                                              DATE:    02/12/22 THERAPEUTIC EXERCISE: to improve flexibility, strength and mobility.  Verbal and tactile cues throughout for technique. NuStep L5 x 6 min S/L Open Book Stretch R/L 2x10- cues to open chest fully Supine shoulder horizontal abduction 1x10 Standing blance with 1/2 clock on R/L- 2x5- LOB posteriorly 2x during stepping back motion  THERAPEUTIC ACTIVITIES: Berg Balance Scale: 53/56 FGA: 26/30 5xSTS: 17.25 sec   02/07/22 THERAPEUTIC EXERCISE: to improve flexibility, strength and  mobility.  Verbal and tactile cues throughout for technique. NuStep L4x6 min Seated HS Stretch R/L 2x30 sec hold Standing hip abduction walks at counter RTB- 4x10 ft Standing hip extension at counter RTB R/L 2x10 Forward Step up 6" R/L 1x10 each Step Up and Over 6" R/L 1x10 each Standing Shoulder Horizontal Abduction RTB 3x10- at doorframe for tactile cues Standing Shoulder R/L Diagonals RTB 2x10- at doorframe for tactile cues Standing shoulder ER R/L RTB 2x10  NEUROMUSCULAR RE-EDUCATION: To improve balance and reduce fall risk.  Standing airex static 2x30 sec Standing airex with horizontal/vertical HT 2x30 sec each Standing airex with EC, 2x30 sce, 1 LOB forward on 1st set   02/05/22 THERAPEUTIC EXERCISE: to improve flexibility, strength and mobility.  Verbal and tactile cues throughout for technique. UBE L2.0 x 6 min (3' each forward and back) Standing R/L 4-way hip SLR with GTB at ankle x 10, intermittent UE support on back of chair Prone I's, T's, Y's & W's + scap retraction and thoracic/cervical extension over red Pball x 10 each - PT providing intermittent stabilization of ball    PATIENT EDUCATION:  Education details: HEP update - postural strengthening and interpretation of balance testing results Person educated: Patient Education method: Explanation, Demonstration, Verbal cues, and Handouts Education comprehension: verbalized understanding and returned demonstration  HOME EXERCISE PROGRAM: Access Code: [email or web address] URL: [email or web address] Date: 02/07/2022 Prepared by: Zeb Comfort  Exercises - Sit to Stand  - 3 x daily - 7 x weekly - 1-2 sets - 5 reps - Gastroc Stretch on Wall  - 2 x daily - 7 x weekly - 1 sets - 3 reps - 30-60 sec hold - Soleus Stretch on Wall  - 2 x daily - 7 x weekly - 1 sets - 3 reps -  30-60 sec hold - Supine Bridge  - 2 x daily - 7 x weekly - 1-3 sets - 10 reps - Standing Shoulder Diagonal Horizontal Abduction 60/120 Degrees with  Resistance  - 1 x daily - 4 x weekly - 2 sets - 10 reps - 3 sec hold - Standing Shoulder Horizontal Abduction with Resistance  - 1 x daily - 4 x weekly - 2 sets - 10 reps - 3 sec hold - Standing Bilateral Low Shoulder Row with Anchored Resistance  - 1 x daily - 4 x weekly - 2 sets - 10 reps - 5 sec hold - Scapular Retraction with Resistance Advanced  - 1 x daily - 4 x weekly - 2 sets - 10 reps - 5 sec hold - Standing Hip Flexion with Anchored Resistance and Chair Support  - 1 x daily - 4 x weekly - 2 sets - 10 reps - 3 sec hold - Standing Hip Adduction with Anchored Resistance  - 1 x daily - 4 x weekly - 2 sets - 10 reps - 3 sec hold - Standing Hip Extension with Anchored Resistance  - 1 x daily - 4 x weekly - 2 sets - 10 reps - 3 sec hold - Standing Hip Abduction with Anchored Resistance  - 1 x daily - 4 x weekly - 2 sets - 10 reps - 3 sec hold - Wall Push Up  - 1 x daily - 4 x weekly - 3 sets - 10 reps - Side Stepping with Resistance at Ankles  - 1 x daily - 4 x weekly - 3 sets - 10 reps   ASSESSMENT:  CLINICAL IMPRESSION: Philip [name] reports today with slight knee pain but nothing abnormal. This session included assessment of LTGs to determine if recertification is needed to extend POC. He has shown progress with both dynamic and static balance. He scored 26/30 on FGA and 53/56 on Berg. He has also shown progress in functional strength with improvement in 5xSTS in 17.25 sec. He has not fully met his LTG and could still benefit from skilled therapy to address dynamic balance deficits during functional activity; he will benefit from additional 4-6 week of therapy to address stated impairments.  OBJECTIVE IMPAIRMENTS: Abnormal gait, decreased balance, decreased ROM, decreased strength, increased edema, increased muscle spasms, impaired flexibility, and pain.   ACTIVITY LIMITATIONS: locomotion level  PARTICIPATION LIMITATIONS: yard work  PERSONAL FACTORS: Age and 3+ comorbidities: Advanced prostate  CA with disease to bone dx 2016, anemia, anxiety  are also affecting patient's functional outcome.   REHAB POTENTIAL: Good  CLINICAL DECISION MAKING: Evolving/moderate complexity  EVALUATION COMPLEXITY: Low   GOALS: Goals reviewed with patient? Yes  SHORT TERM GOALS: Target date: 03/12/22  Patient will be independent with initial HEP. Baseline:  Goal status: MET  02/05/22  2.  Patient will demonstrate decreased fall risk by scoring < 25 sec on TUG. Baseline:  Goal status: MET  01/31/22  3.  BERG/TUG/DGI assessed  Baseline:  Goal status: MET  01/31/22  LONG TERM GOALS: Target date: 04/09/2022  Patient will be independent with advanced/ongoing HEP to improve outcomes and carryover.  Baseline:  Goal status: MET 02/12/22  2.  Patient will be able to ambulate 600' with LRAD with good safety to access community.  Baseline:  Goal status: MET 02/12/22  3.  Patient will be able to step up/down curb safely with LRAD for safety with community ambulation.  Baseline:  Goal status: MET 02/12/22  4.  Patient will demonstrate  improved functional LE strength as demonstrated by improved 5xSTS to <= 12.6 sec. Baseline: 22.73 Goal status: IN PROGRESS 02/12/22 17.25 sec  5.  Patient will demonstrate at least 28/30 on FGA to improve gait stability and reduce risk for falls. Baseline: 25/30 (01/31/22) Goal status: IN PROGRESS 02/12/22 26/30  6.  Patient will score >/= 55/56 on Berg Balance test to demonstrate lower risk of falls. .  Baseline: 52/56 Goal status: IN PROGRESS 02/12/22 53/56  7.  Pt will report decreased knee pain by >= 50% with gait and ADLS to improve QOL. Baseline:  Goal status: IN PROGRESS 02/12/22 Pt reports knee pain has decreased by 5%.  8.  Patient will report improved overall endurance with ADLs by 50%. Baseline:  Goal status: IN PROGRESS 02/12/22 Pt reports 10% improvement in endurance.    PLAN:  PT FREQUENCY: 2x/week  PT DURATION: 8 weeks  PLANNED  INTERVENTIONS: Therapeutic exercises, Therapeutic activity, Neuromuscular re-education, Balance training, Gait training, Patient/Family education, Self Care, Joint mobilization, Stair training, Aquatic Therapy, Dry Needling, Electrical stimulation, Cryotherapy, Taping, Ionotophoresis 35m/ml Dexamethasone, and Manual therapy  PLAN FOR NEXT SESSION: Review and progress HEP for postural strength, general functional strengthening, HS flexibilty,  gait, balance   OZeb Comfort Student-PT 02/12/2022, 11:54 AM

## 2022-03-04 ENCOUNTER — Telehealth: Payer: Self-pay | Admitting: Pharmacy Technician

## 2022-03-04 ENCOUNTER — Other Ambulatory Visit (HOSPITAL_COMMUNITY): Payer: Self-pay

## 2022-03-04 ENCOUNTER — Encounter: Payer: Self-pay | Admitting: Oncology

## 2022-03-04 NOTE — Telephone Encounter (Signed)
Oral Oncology Patient Advocate Encounter   Received notification that patient is due for re-enrollment for assistance for [employer] through [employer].   Re-enrollment process has been initiated and is pending final benefits investigation.  Re-enrollment Key: [place] phone number [phone].   I will continue to follow until final determination.  Lady Deutscher, CPhT-Adv Oncology Pharmacy Patient [place] Direct Number: [phone]  Fax: [phone]

## 2022-03-05 ENCOUNTER — Other Ambulatory Visit: Payer: Self-pay | Admitting: Oncology

## 2022-03-05 DIAGNOSIS — C61 Malignant neoplasm of prostate: Secondary | ICD-10-CM

## 2022-03-07 NOTE — Telephone Encounter (Signed)
Oral Oncology Patient Advocate Encounter  Received update via fax from [employer] regarding patient assistance application for [place].  Status is pending Proof of Income and a statement of household size.  [place] phone [phone]  I will continue to check status until final determination.  Lady Deutscher, CPhT-Adv Oncology Pharmacy Patient [place] Direct Number: [phone]  Fax: [phone]

## 2022-03-11 ENCOUNTER — Encounter: Payer: Self-pay | Admitting: Oncology

## 2022-03-11 ENCOUNTER — Other Ambulatory Visit: Payer: Self-pay | Admitting: *Deleted

## 2022-03-11 DIAGNOSIS — Z95828 Presence of other vascular implants and grafts: Secondary | ICD-10-CM

## 2022-03-11 MED ORDER — LIDOCAINE-PRILOCAINE 2.5-2.5 % EX CREA: 1.0000 | TOPICAL_CREAM | CUTANEOUS | 0 refills | Status: DC | PRN

## 2022-03-11 NOTE — Telephone Encounter (Signed)
Oral Oncology Patient Advocate Encounter   Submitted POI to [facility] [employer] via online portal.   I will continue to follow until final determination.  Philip [name], CPhT-Adv Oncology Pharmacy Patient [place] Direct Number: [phone]  Fax: [phone]

## 2022-03-12 ENCOUNTER — Ambulatory Visit: Payer: Medicare Other | Admitting: Psychologist

## 2022-03-14 ENCOUNTER — Encounter: Payer: Self-pay | Admitting: Physical Therapy

## 2022-03-14 ENCOUNTER — Other Ambulatory Visit: Payer: Self-pay | Admitting: *Deleted

## 2022-03-14 ENCOUNTER — Ambulatory Visit: Payer: Medicare Other | Attending: Oncology | Admitting: Physical Therapy

## 2022-03-14 DIAGNOSIS — M25562 Pain in left knee: Secondary | ICD-10-CM | POA: Diagnosis not present

## 2022-03-14 DIAGNOSIS — G8929 Other chronic pain: Secondary | ICD-10-CM

## 2022-03-14 DIAGNOSIS — M6281 Muscle weakness (generalized): Secondary | ICD-10-CM

## 2022-03-14 DIAGNOSIS — R2681 Unsteadiness on feet: Secondary | ICD-10-CM

## 2022-03-14 DIAGNOSIS — C61 Malignant neoplasm of prostate: Secondary | ICD-10-CM

## 2022-03-14 MED ORDER — ENZALUTAMIDE 40 MG PO TABS: ORAL_TABLET | ORAL | 12 refills | Status: DC

## 2022-03-14 NOTE — Therapy (Signed)
OUTPATIENT PHYSICAL THERAPY TREATMENT   Patient Name: Philip [name] MRN: [id number] DOB:23-Nov-1945, 77 y.o., male Today's Date: 03/14/2022     END OF SESSION:  PT End of Session - 03/14/22 1016     Visit Number 6    Date for PT Re-Evaluation 04/09/22    Authorization Type MCR    Progress Note Due on Visit 15   Recert/PN on visit #5 - 02/12/22   PT Start Time 1016    PT Stop Time 1103    PT Time Calculation (min) 47 min    Activity Tolerance Patient tolerated treatment well    Behavior During Therapy [facility] for tasks assessed/performed               Past Medical History:  Diagnosis Date   Atherosclerosis of aorta ([place]) 03/11/2017   The 10-year ASCVD risk score Mikey Bussing DC Jr., et al., 2013) is: 24.6%   Values used to calculate the score:     Age: 54 years     Sex: Male     Is Non-Hispanic African American: No     Diabetic: No     Tobacco smoker: No     Systolic Blood Pressure: 025 mmHg     Is BP treated: No     HDL Cholesterol: 71.1 mg/dL     Total Cholesterol: 212 mg/dL   Cancer (HCC)    PROSTATE   Chronic idiopathic constipation 03/11/2017   Edema leg Sept 28, 2016   left leg from foot to thigh, increasinly worse over last 4 weeks   Essential hypertension 03/30/2018   Gastroesophageal reflux disease without esophagitis 03/11/2017   Hyperlipidemia LDL goal <100 03/16/2017   The 10-year ASCVD risk score Mikey Bussing DC Jr., et al., 2013) is: 30.6%   Values used to calculate the score:     Age: 52 years     Sex: Male     Is Non-Hispanic African American: No     Diabetic: No     Tobacco smoker: No     Systolic Blood Pressure: 852 mmHg     Is BP treated: Yes     HDL Cholesterol: 69.3 mg/dL     Total Cholesterol: 211 mg/dL   Hypertension    Hypoglycemia    Swelling LAST 30 DAYS DR [facility] AWARE   LEFT LEG AND FOOT   Past Surgical History:  Procedure Laterality Date   BOWEL DECOMPRESSION N/A 03/07/2021   Procedure: BOWEL DECOMPRESSION;  Surgeon: Yetta Flock, MD;  Location: [facility];   Service: Gastroenterology;  Laterality: N/A;   BOWEL DECOMPRESSION N/A 06/07/2021   Procedure: BOWEL DECOMPRESSION;  Surgeon: Carol Ada, MD;  Location: [facility];  Service: Gastroenterology;  Laterality: N/A;   FLEXIBLE SIGMOIDOSCOPY N/A 03/07/2021   Procedure: FLEXIBLE SIGMOIDOSCOPY;  Surgeon: Yetta Flock, MD;  Location: [facility];  Service: Gastroenterology;  Laterality: N/A;   FLEXIBLE SIGMOIDOSCOPY N/A 06/07/2021   Procedure: FLEXIBLE SIGMOIDOSCOPY;  Surgeon: Carol Ada, MD;  Location: [facility];  Service: Gastroenterology;  Laterality: N/A;   GUM SURGERY     TEETH IMPLANTS ALSO   LAPAROSCOPIC SIGMOID COLECTOMY N/A 06/10/2021   Procedure: LAPAROSCOPIC ASSISTED SIGMOID COLECTOMY;  Surgeon: Johnathan Hausen, MD;  Location: [facility];  Service: General;  Laterality: N/A;   ORCHIECTOMY Bilateral 01/03/2015   Procedure: ORCHIECTOMY;  Surgeon: Alexis Frock, MD;  Location: [facility];  Service: Urology;  Laterality: Bilateral;   Patient Active Problem List   Diagnosis Date Noted   Right foot injury, initial encounter 01/14/2022  Sigmoid volvulus ([place]) 06/07/2021   Generalized abdominal tenderness without rebound tenderness 03/07/2021   Iron deficiency anemia 03/07/2021   Diuretic-induced hypokalemia 12/27/2019   Tinnitus of both ears 09/07/2019   Laryngopharyngeal reflux (LPR) 09/07/2019   Essential hypertension 03/30/2018   Port-A-Cath in place 08/25/2017   Hyperlipidemia LDL goal <100 03/16/2017   Atherosclerosis of aorta ([place]) 03/11/2017   Chronic idiopathic constipation 03/11/2017   Seasonal allergic rhinitis due to pollen 03/11/2017   Gastroesophageal reflux disease without esophagitis 03/11/2017   Malignant neoplasm of bone ([place]) 10/16/2015   Prostate cancer ([place]) 02/02/2015    PCP: [name], MD  REFERRING PROVIDER: [name], MD  REFERRING DIAG: R26.81 (ICD-10-CM) - Gait instability C61 (ICD-10-CM) - Prostate cancer ([place])  THERAPY DIAG:   Unsteadiness on feet  Chronic pain of left knee  Muscle weakness (generalized)  RATIONALE FOR EVALUATION AND TREATMENT: Rehabilitation  ONSET DATE: 2016  NEXT MD APPOINTMENT: 03/19/22   SUBJECTIVE:   SUBJECTIVE STATEMENT: Pt reports he was not able to work on his HEP as much while on vacation over the holidays. He feels like he still needs to work on overall strength, balance and activity tolerance.  PERTINENT HISTORY:  Advanced prostate CA with disease to bone dx 2016, anemia, anxiety  PAIN:  Are you having pain? Yes: NPRS scale: 2-3/10 Pain location: left knee  Pain description: ache Aggravating factors: being active Relieving factors: tylenol  PRECAUTIONS: Other: Active CA  WEIGHT BEARING RESTRICTIONS: No  FALLS:  Has patient fallen in last 6 months? 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     OBJECTIVE:   DIAGNOSTIC FINDINGS:  Left Knee xray 01/18/21 - No fracture or dislocation of the left knee. Mild medial compartment and patellofemoral arthrosis, with preserved lateral compartment. Probable loose bodies in the posterior joint space. Moderate knee joint effusion. Soft unremarkable.   IMPRESSION: No fracture or dislocation of the left knee. Mild medial compartment and patellofemoral arthrosis, with preserved lateral compartment. Probable loose bodies in the posterior joint space. Moderate knee joint effusion.   PATIENT SURVEYS:  ABC scale 99.38%  COGNITION: Overall cognitive status: Within functional limits for tasks assessed     SENSATION: WFL  EDEMA:  Circumferential: R 41.5 cm  L 44 cm  MUSCLE LENGTH: HS: marked bil  tightness Quads: ITB: mod tightness L Piriformis: WNL  Hip Flexors: Heelcords: marked bil  POSTURE: decreased lumbar lordosis and weight shift left  PALPATION: Palpation: TTP at Lt glute min/med, bil lumbar; mild left ITB. Spinal Mobility: Patellar Mobility: WNL L  LUMBAR ROM:  WFL  except bil rot R 75%, L 50% with less weight shift  LOWER EXTREMITY ROM:  WNL except bil ankle DF limited by tight heel cords.  LOWER EXTREMITY MMT: decreased core strength with MMT.  MMT Right eval Left eval Right 02/12/22 Left 02/12/22  Hip flexion 4+ 4+* 4+ 4+  Hip extension 4 4+ 4+ 4+  Hip abduction 5 4+ 5 5  Hip adduction      Hip internal rotation      Hip external rotation      Knee flexion 5 5* 5 5*  Knee extension '5 5 5 5  '$ Ankle dorsiflexion 5 5 5  5*   (Blank rows = not tested)  *pain  UE MMT:  Bil shoulder flex and ABD 5/5, ext 4+/5  FUNCTIONAL TESTS:  5 times sit to stand: 22.73 with mild pain bil knees; 17.25 sec (02/12/22) Timed up and go (TUG): 11.53 sec (01/31/22) 10 meter walk test: 8.44 (01/31/22) - Gait speed = 3.89 ft/sec Berg Balance Scale: 52/56 (01/31/22); 53/56 (02/12/22 ) Functional gait assessment: 25/30 (01/31/22); 26/30 (02/12/22); 25-28 = low risk fall   GAIT: Distance walked: 40 Assistive device utilized: None Level of assistance: Complete Independence Comments: antalgic   TODAY'S TREATMENT:                                                                                                                              DATE:    03/14/22 THERAPEUTIC EXERCISE: to improve flexibility, strength and mobility.  Verbal and tactile cues throughout for technique.  UBE in standing - L3.0 x 6 min (3' each fwd & back) Seated GTB scap retraction + B shoulder ER 10 x 3", 2 sets Seated GTB scap retraction + B shoulder horiz ABD 10 x 3", 2 sets Seated GTB rows 10 x 3", 2 sets Standing RTB march with looped band at midfeet 2 x 10 R hip hike/lower over edge of 4" step with  heel tap to floor 2 x 10, attempted on L but unable due to L knee pain  NEUROMUSCULAR RE-EDUCATION: To improve balance, proprioception, and reduce fall risk. Standing on Airex pad: EO with normal BOS x 30" EC with normal BOS x 30" EO with narrow BOS x 30" EC with narrow BOS x ~7" - LOB x 2 attempts requiring PT assistance to prevent fall EO with narrow BOS + horizontal/vertical head turns x 5 each direction EO with narrow BOS + upper trunk rotation with arms folded across chest x 5 each direction   02/12/22 THERAPEUTIC EXERCISE: to improve flexibility, strength and mobility.  Verbal and tactile cues throughout for technique. NuStep L5 x 6 min S/L Open Book Stretch R/L 2x10- cues to open chest fully Supine shoulder horizontal abduction 1x10 Standing blance with 1/2 clock on R/L- 2x5- LOB posteriorly 2x during stepping back motion  THERAPEUTIC ACTIVITIES: Berg Balance Scale: 53/56 FGA: 26/30 5xSTS: 17.25 sec   02/07/22 THERAPEUTIC EXERCISE: to improve flexibility, strength and mobility.  Verbal and tactile cues throughout for technique. NuStep L4x6 min Seated HS Stretch R/L 2x30 sec hold Standing hip abduction walks at counter RTB- 4x10 ft Standing hip extension at counter RTB R/L 2x10 Forward Step up 6" R/L 1x10 each Step Up and Over 6" R/L 1x10 each Standing Shoulder Horizontal Abduction RTB 3x10- at doorframe for tactile cues Standing Shoulder R/L Diagonals RTB 2x10- at doorframe for tactile cues Standing shoulder ER R/L RTB 2x10  NEUROMUSCULAR RE-EDUCATION: To improve balance and reduce fall risk.  Standing airex static 2x30 sec Standing airex with horizontal/vertical HT 2x30 sec each Standing airex with EC, 2x30 sce, 1  LOB forward on 1st set   PATIENT EDUCATION:  Education details: HEP update - RTB march, progress UB to GTB resistance Person educated: Patient Education method: Explanation, Demonstration, Verbal cues, and Handouts Education comprehension: verbalized  understanding and returned demonstration   HOME EXERCISE PROGRAM: Access Code: [email or web address] URL: [email or web address] Date: 03/14/2022 Prepared by: Annie Paras  Exercises - Sit to Stand  - 3 x daily - 7 x weekly - 1-2 sets - 5 reps - Gastroc Stretch on Wall  - 2 x daily - 7 x weekly - 1 sets - 3 reps - 30-60 sec hold - Soleus Stretch on Wall  - 2 x daily - 7 x weekly - 1 sets - 3 reps - 30-60 sec hold - Supine Bridge  - 2 x daily - 7 x weekly - 1-3 sets - 10 reps - Standing Shoulder Diagonal Horizontal Abduction 60/120 Degrees with Resistance  - 1 x daily - 4 x weekly - 2 sets - 10 reps - 3 sec hold - Standing Shoulder Horizontal Abduction with Resistance  - 1 x daily - 4 x weekly - 2 sets - 10 reps - 3 sec hold - Standing Bilateral Low Shoulder Row with Anchored Resistance  - 1 x daily - 4 x weekly - 2 sets - 10 reps - 5 sec hold - Scapular Retraction with Resistance Advanced  - 1 x daily - 4 x weekly - 2 sets - 10 reps - 5 sec hold - Standing Hip Flexion with Anchored Resistance and Chair Support  - 1 x daily - 4 x weekly - 2 sets - 10 reps - 3 sec hold - Standing Hip Adduction with Anchored Resistance  - 1 x daily - 4 x weekly - 2 sets - 10 reps - 3 sec hold - Standing Hip Extension with Anchored Resistance  - 1 x daily - 4 x weekly - 2 sets - 10 reps - 3 sec hold - Standing Hip Abduction with Anchored Resistance  - 1 x daily - 4 x weekly - 2 sets - 10 reps - 3 sec hold - Wall Push Up  - 1 x daily - 4 x weekly - 3 sets - 10 reps - Side Stepping with Resistance at Ankles  - 1 x daily - 4 x weekly - 3 sets - 10 reps - Marching with Resistance  - 1 x daily - 4 x weekly - 2 sets - 10 reps - 3 sec hold   ASSESSMENT:  CLINICAL IMPRESSION: Philip [name] stated he feels like he is progressing well with gaining some strength and mobility but still feels like he still needs to gain some more. He was able to progress to a GTB for the UE exercises and responded well to the increase in  resistance. Philip [name] is motivated to continue working on his balance training and had some difficulty performing some variations but that is something that can be worked on in later visits. Philip [name]'s LE strength is improving but his hip/quadriceps strength is still the focus in order to meet patient goal of STS time. Overall, Philip [name] is improving with the applied interventions and will continue to progress with future exercises.   OBJECTIVE IMPAIRMENTS: Abnormal gait, decreased balance, decreased ROM, decreased strength, increased edema, increased muscle spasms, impaired flexibility, and pain.   ACTIVITY LIMITATIONS: locomotion level  PARTICIPATION LIMITATIONS: yard work  PERSONAL FACTORS: Age and 3+ comorbidities: Advanced prostate CA with disease to bone dx 2016, anemia, anxiety  are also affecting patient's functional outcome.  REHAB POTENTIAL: Good  CLINICAL DECISION MAKING: Evolving/moderate complexity  EVALUATION COMPLEXITY: Low   GOALS: Goals reviewed with patient? Yes  SHORT TERM GOALS: Target date: 03/12/22  Patient will be independent with initial HEP. Baseline:  Goal status: MET  02/05/22  2.  Patient will demonstrate decreased fall risk by scoring < 25 sec on TUG. Baseline:  Goal status: MET  01/31/22  3.  BERG/TUG/DGI assessed  Baseline:  Goal status: MET  01/31/22  LONG TERM GOALS: Target date: 04/09/2022  Patient will be independent with advanced/ongoing HEP to improve outcomes and carryover.  Baseline:  Goal status: MET 02/12/22  2.  Patient will be able to ambulate 600' with LRAD with good safety to access community.  Baseline:  Goal status: MET 02/12/22  3.  Patient will be able to step up/down curb safely with LRAD for safety with community ambulation.  Baseline:  Goal status: MET 02/12/22  4.  Patient will demonstrate improved functional LE strength as demonstrated by improved 5xSTS to <= 12.6 sec. Baseline: 22.73 Goal status: IN PROGRESS 02/12/22 17.25  sec  5.  Patient will demonstrate at least 28/30 on FGA to improve gait stability and reduce risk for falls. Baseline: 25/30 (01/31/22) Goal status: IN PROGRESS 02/12/22 26/30  6.  Patient will score >/= 55/56 on Berg Balance test to demonstrate lower risk of falls. .  Baseline: 52/56 Goal status: IN PROGRESS 02/12/22 53/56  7.  Pt will report decreased knee pain by >/= 50% with gait and ADLS to improve QOL. Baseline:  Goal status: IN PROGRESS 02/12/22 Pt reports knee pain has decreased by 5%.  8.  Patient will report improved overall endurance with ADLs by 50%. Baseline:  Goal status: IN PROGRESS 02/12/22 Pt reports 10% improvement in endurance.    PLAN:  PT FREQUENCY: 2x/week  PT DURATION: 8 weeks  PLANNED INTERVENTIONS: Therapeutic exercises, Therapeutic activity, Neuromuscular re-education, Balance training, Gait training, Patient/Family education, Self Care, Joint mobilization, Stair training, Aquatic Therapy, Dry Needling, Electrical stimulation, Cryotherapy, Taping, Ionotophoresis '4mg'$ /ml Dexamethasone, and Manual therapy  PLAN FOR NEXT SESSION: Reassess strength & FGA; Review and progress HEP for postural strength, general functional strengthening, HS flexibilty, gait, balance   Percival Spanish, PT 03/14/2022, 11:32 AM

## 2022-03-17 ENCOUNTER — Inpatient Hospital Stay: Payer: Medicare Other | Attending: Oncology

## 2022-03-17 DIAGNOSIS — C61 Malignant neoplasm of prostate: Secondary | ICD-10-CM | POA: Insufficient documentation

## 2022-03-17 DIAGNOSIS — D649 Anemia, unspecified: Secondary | ICD-10-CM | POA: Diagnosis not present

## 2022-03-17 DIAGNOSIS — I10 Essential (primary) hypertension: Secondary | ICD-10-CM | POA: Insufficient documentation

## 2022-03-17 DIAGNOSIS — Z9079 Acquired absence of other genital organ(s): Secondary | ICD-10-CM | POA: Diagnosis not present

## 2022-03-17 DIAGNOSIS — F419 Anxiety disorder, unspecified: Secondary | ICD-10-CM | POA: Insufficient documentation

## 2022-03-17 DIAGNOSIS — C7951 Secondary malignant neoplasm of bone: Secondary | ICD-10-CM | POA: Insufficient documentation

## 2022-03-17 DIAGNOSIS — Z95828 Presence of other vascular implants and grafts: Secondary | ICD-10-CM

## 2022-03-17 LAB — CBC WITH DIFFERENTIAL (CANCER CENTER ONLY)
Abs Immature Granulocytes: 0.02 10*3/uL (ref 0.00–0.07)
Basophils Absolute: 0 10*3/uL (ref 0.0–0.1)
Basophils Relative: 1 %
Eosinophils Absolute: 0.1 10*3/uL (ref 0.0–0.5)
Eosinophils Relative: 2 %
HCT: 35 % — ABNORMAL LOW (ref 39.0–52.0)
Hemoglobin: 12.2 g/dL — ABNORMAL LOW (ref 13.0–17.0)
Immature Granulocytes: 0 %
Lymphocytes Relative: 24 %
Lymphs Abs: 1.2 10*3/uL (ref 0.7–4.0)
MCH: 33.1 pg (ref 26.0–34.0)
MCHC: 34.9 g/dL (ref 30.0–36.0)
MCV: 94.9 fL (ref 80.0–100.0)
Monocytes Absolute: 0.5 10*3/uL (ref 0.1–1.0)
Monocytes Relative: 11 %
Neutro Abs: 3.3 10*3/uL (ref 1.7–7.7)
Neutrophils Relative %: 62 %
Platelet Count: 233 10*3/uL (ref 150–400)
RBC: 3.69 MIL/uL — ABNORMAL LOW (ref 4.22–5.81)
RDW: 13.2 % (ref 11.5–15.5)
WBC Count: 5.2 10*3/uL (ref 4.0–10.5)
nRBC: 0 % (ref 0.0–0.2)

## 2022-03-17 LAB — CMP (CANCER CENTER ONLY)
ALT: 12 U/L (ref 0–44)
AST: 16 U/L (ref 15–41)
Albumin: 3.8 g/dL (ref 3.5–5.0)
Alkaline Phosphatase: 48 U/L (ref 38–126)
Anion gap: 6 (ref 5–15)
BUN: 22 mg/dL (ref 8–23)
CO2: 26 mmol/L (ref 22–32)
Calcium: 9.2 mg/dL (ref 8.9–10.3)
Chloride: 105 mmol/L (ref 98–111)
Creatinine: 1.03 mg/dL (ref 0.61–1.24)
GFR, Estimated: >60 mL/min (ref >60)
Glucose, Bld: 92 mg/dL (ref 70–99)
Potassium: 4.4 mmol/L (ref 3.5–5.1)
Sodium: 137 mmol/L (ref 135–145)
Total Bilirubin: 0.5 mg/dL (ref 0.3–1.2)
Total Protein: 6.5 g/dL (ref 6.5–8.1)

## 2022-03-17 MED ORDER — SODIUM CHLORIDE 0.9% FLUSH
10.0000 mL | Freq: Once | INTRAVENOUS | Status: AC
  Administered 2022-03-17: 10 mL

## 2022-03-17 MED ORDER — HEPARIN SOD (PORK) LOCK FLUSH 100 UNIT/ML IV SOLN
500.0000 [IU] | Freq: Once | INTRAVENOUS | Status: AC
  Administered 2022-03-17: 500 [IU]

## 2022-03-18 LAB — PROSTATE-SPECIFIC AG, SERUM (LABCORP): Prostate Specific Ag, Serum: <0.1 ng/mL (ref 0.0–4.0)

## 2022-03-18 NOTE — Telephone Encounter (Signed)
Oral Oncology Patient Advocate Encounter   Received notification re-enrollment for assistance for Xtandi through [employer] has been approved. Patient may continue to receive their medication at $0 from this program.    [employer] phone number [phone].   Effective dates: 03/17/22 through 03/03/23  I have spoken to the patient.  Lady Deutscher, CPhT-Adv Oncology Pharmacy Patient [place] Direct Number: [phone]  Fax: [phone]

## 2022-03-19 ENCOUNTER — Inpatient Hospital Stay: Payer: Medicare Other

## 2022-03-19 ENCOUNTER — Inpatient Hospital Stay (HOSPITAL_BASED_OUTPATIENT_CLINIC_OR_DEPARTMENT_OTHER): Payer: Medicare Other | Admitting: Oncology

## 2022-03-19 VITALS — BP 142/90 | HR 70 | Temp 97.6°F | Resp 18 | Ht 76.0 in | Wt 193.0 lb

## 2022-03-19 DIAGNOSIS — C61 Malignant neoplasm of prostate: Secondary | ICD-10-CM

## 2022-03-19 DIAGNOSIS — D649 Anemia, unspecified: Secondary | ICD-10-CM | POA: Diagnosis not present

## 2022-03-19 DIAGNOSIS — I10 Essential (primary) hypertension: Secondary | ICD-10-CM | POA: Diagnosis not present

## 2022-03-19 DIAGNOSIS — Z9079 Acquired absence of other genital organ(s): Secondary | ICD-10-CM | POA: Diagnosis not present

## 2022-03-19 DIAGNOSIS — F419 Anxiety disorder, unspecified: Secondary | ICD-10-CM | POA: Diagnosis not present

## 2022-03-19 DIAGNOSIS — C7951 Secondary malignant neoplasm of bone: Secondary | ICD-10-CM | POA: Diagnosis not present

## 2022-03-19 NOTE — Progress Notes (Signed)
Hematology and Oncology Follow Up Visit  Philip [name] [id number] 1946-01-16 77 y.o. 03/19/2022 9:57 AM Ronnald Ramp [name], MDJones, [name], MD   Principle Diagnosis: 77 year old man with castration-resistant advanced prostate cancer with disease to the bone and lymphadenopathy diagnosed in 2016.  He presented with PSA of 2900 and advanced disease at that time.   Prior Therapy: Status post orchiectomy done on November 2016.  Taxotere chemotherapy at 75 mg/m to start on 02/16/2015. He is completed 6 cycles of therapy in March 2017.  Zytiga 1000 mg daily started in July 2017.  Therapy discontinued in 2023 due to insurance reasons.  Current therapy: Xtandi 160 mg daily started in February 2023.  Interim History:  [name] returns today for a repeat follow-up.  Since the last visit, he continues to do well without any major complaints.  He has tolerated Xtandi although he has not taken the last few days due to preauthorization issues.  He denies any nausea, vomiting or abdominal pain.  He denies any pathological fractures or excessive fatigue.  His performance status and quality of life remain excellent.  His mood continues improving and has benefited from counseling from Dr. Michail Sermon.   Medications: Updated on review. Current Outpatient Medications  Medication Sig Dispense Refill   aspirin EC 81 MG tablet Take 81 mg by mouth daily. Swallow whole.     atorvastatin (LIPITOR) 10 MG tablet TAKE 1 TABLET BY MOUTH EVERY DAY 90 tablet 1   BIOTIN 5000 PO Take 1 tablet by mouth daily.     CALCIUM-VITAMIN D PO Take 1 tablet by mouth daily.     Denosumab (XGEVA [place]) Inject into the skin. Every 6-8 weeks.     enzalutamide (XTANDI) 40 MG tablet Take 4 tablets ('160mg'$ ) by mouth once daily as directed by physician. 120 tablet 12   ferrous sulfate 325 (65 FE) MG EC tablet TAKE 1 TABLET BY MOUTH EVERY DAY WITH BREAKFAST 90 tablet 1   fexofenadine (ALLEGRA) 180 MG tablet Take 180 mg by mouth daily.      hydrOXYzine (ATARAX/VISTARIL) 10 MG tablet Take 10 mg by mouth daily.     KLOR-CON M20 20 MEQ tablet TAKE 1 TABLET BY MOUTH TWICE A DAY 180 tablet 0   lidocaine-prilocaine (EMLA) cream Apply 1 Application topically as needed (access port). 30 g 0   LORazepam (ATIVAN) 1 MG tablet TAKE 1 TABLET BY MOUTH EVERY 8 HOURS AS NEEDED FOR ANXIETY 90 tablet 0   Multiple Vitamin (MULTIVITAMIN) tablet Take 1 tablet by mouth daily.     olmesartan (BENICAR) 40 MG tablet TAKE 1 TABLET BY MOUTH EVERY DAY 90 tablet 1   omeprazole (PRILOSEC) 20 MG capsule TAKE 1 CAPSULE BY MOUTH DAILY AS NEEDED. 90 capsule 1   No current facility-administered medications for this visit.     Allergies:  Allergies  Allergen Reactions   Prednisone Anxiety      Physical Exam:        Blood pressure (!) 142/90, pulse 70, temperature 97.6 F (36.4 C), temperature source Temporal, resp. rate 18, height '6\' 4"'$  (1.93 m), weight 193 lb (87.5 kg), SpO2 95 %.      ECOG: 0    General appearance: Comfortable appearing without any discomfort Head: Normocephalic without any trauma Oropharynx: Mucous membranes are moist and pink without any thrush or ulcers. Eyes: Pupils are equal and round reactive to light. Lymph nodes: No cervical, supraclavicular, inguinal or axillary lymphadenopathy.   Heart:regular rate and rhythm.  S1 and S2  without leg edema. Lung: Clear without any rhonchi or wheezes.  No dullness to percussion. Abdomin: Soft, nontender, nondistended with good bowel sounds.  No hepatosplenomegaly. Musculoskeletal: No joint deformity or effusion.  Full range of motion noted. Neurological: No deficits noted on motor, sensory and deep tendon reflex exam. Skin: No petechial rash or dryness.  Appeared moist.                          Lab Results: Lab Results  Component Value Date   WBC 5.2 03/17/2022   HGB 12.2 (L) 03/17/2022   HCT 35.0 (L) 03/17/2022   MCV 94.9 03/17/2022   PLT 233  03/17/2022   PSA 0.02 (L) 03/26/2021     Chemistry      Component Value Date/Time   NA 137 03/17/2022 1053   NA 139 02/10/2017 1323   K 4.4 03/17/2022 1053   K 3.4 (L) 02/10/2017 1323   CL 105 03/17/2022 1053   CO2 26 03/17/2022 1053   CO2 23 02/10/2017 1323   BUN 22 03/17/2022 1053   BUN 13.6 02/10/2017 1323   CREATININE 1.03 03/17/2022 1053   CREATININE 0.9 02/10/2017 1323      Component Value Date/Time   CALCIUM 9.2 03/17/2022 1053   CALCIUM 9.2 02/10/2017 1323   ALKPHOS 48 03/17/2022 1053   ALKPHOS 50 02/10/2017 1323   AST 16 03/17/2022 1053   AST 20 02/10/2017 1323   ALT 12 03/17/2022 1053   ALT 15 02/10/2017 1323   BILITOT 0.5 03/17/2022 1053   BILITOT 0.52 02/10/2017 1323           Latest Reference Range & Units 01/15/22 13:54 03/17/22 10:53  Prostate Specific Ag, Serum 0.0 - 4.0 ng/mL <0.1 <0.1    IMPRESSION: 1. Previous described small iliac pelvic lymph nodes have all decreased in radiotracer activity compared to PSMA PET scan 01/31/2021 and are unchanged in size. The now mild radiotracer activity favors reactive adenopathy over metastatic adenopathy. 2. No new progressive adenopathy in the abdomen pelvis. 3. No visceral metastasis or skeletal metastasis.    Impression and Plan:  77 year old man with:  1.  Castration-resistant advanced prostate cancer with disease to the bone and lymphadenopathy diagnosed in 2016.    The natural course of this disease was reviewed at this time including his PSA as well as PET scan that was completed in November 2023.  Continues to have excellent response and near complete resolution of his cancer.  Risks and benefits of continuing Xtandi were discussed.  Alternative treatment options including other androgen receptor pathway inhibitors such as darolutamide, apalutamide could be considered if Gillermina Phy is not available or he progresses.  Jevtana chemotherapy or Pluvicto could also be another options.  At this time I  recommended continuing the same treatment.   2. IV access: Port-A-Cath currently in use without any issues.  He prefers to keep it and flushed every 2 months.  3. Hypertension: Mild elevation noted but overall under reasonable control.  4.  Androgen depravation: No additional androgen deprivation is needed at this time.  He is status post orchiectomy.  5.  Bone directed therapy: I recommended continuing holding Xgeva at this time due to dental issues.  He still has a dental infection and continues to follow with periodontist.  6.  Anemia: Remains mild and asymptomatic.  His hemoglobin is close to normal.  7.  Anxiety: This has improved with the help of counseling continues to follow with Dr.  Schooler.  8. Follow-up: In 2 months for a follow-up visit.  30 minutes were dedicated to this encounter.  The time was spent on updating disease status, treatment choices, reviewing imaging studies and future plan of care discussion.  Zola Button, MD 1/17/20249:57 AM

## 2022-03-20 ENCOUNTER — Encounter: Payer: Self-pay | Admitting: Physical Therapy

## 2022-03-20 ENCOUNTER — Ambulatory Visit: Payer: Medicare Other | Admitting: Physical Therapy

## 2022-03-20 DIAGNOSIS — M25562 Pain in left knee: Secondary | ICD-10-CM | POA: Diagnosis not present

## 2022-03-20 DIAGNOSIS — M6281 Muscle weakness (generalized): Secondary | ICD-10-CM

## 2022-03-20 DIAGNOSIS — R2681 Unsteadiness on feet: Secondary | ICD-10-CM | POA: Diagnosis not present

## 2022-03-20 DIAGNOSIS — G8929 Other chronic pain: Secondary | ICD-10-CM | POA: Diagnosis not present

## 2022-03-20 NOTE — Therapy (Signed)
OUTPATIENT PHYSICAL THERAPY TREATMENT   Patient Name: Philip [name] MRN: [id number] DOB:Sep 14, 1945, 77 y.o., male Today's Date: 03/20/2022     END OF SESSION:  PT End of Session - 03/20/22 0932     Visit Number 7    Date for PT Re-Evaluation 04/09/22    Authorization Type MCR    Progress Note Due on Visit 15    PT Start Time 0932    PT Stop Time 1021    PT Time Calculation (min) 49 min    Activity Tolerance Patient tolerated treatment well    Behavior During Therapy [facility] for tasks assessed/performed                Past Medical History:  Diagnosis Date   Atherosclerosis of aorta ([place]) 03/11/2017   The 10-year ASCVD risk score Mikey Bussing DC Jr., et al., 2013) is: 24.6%   Values used to calculate the score:     Age: 33 years     Sex: Male     Is Non-Hispanic African American: No     Diabetic: No     Tobacco smoker: No     Systolic Blood Pressure: 428 mmHg     Is BP treated: No     HDL Cholesterol: 71.1 mg/dL     Total Cholesterol: 212 mg/dL   Cancer (HCC)    PROSTATE   Chronic idiopathic constipation 03/11/2017   Edema leg Sept 28, 2016   left leg from foot to thigh, increasinly worse over last 4 weeks   Essential hypertension 03/30/2018   Gastroesophageal reflux disease without esophagitis 03/11/2017   Hyperlipidemia LDL goal <100 03/16/2017   The 10-year ASCVD risk score Mikey Bussing DC Jr., et al., 2013) is: 30.6%   Values used to calculate the score:     Age: 33 years     Sex: Male     Is Non-Hispanic African American: No     Diabetic: No     Tobacco smoker: No     Systolic Blood Pressure: 768 mmHg     Is BP treated: Yes     HDL Cholesterol: 69.3 mg/dL     Total Cholesterol: 211 mg/dL   Hypertension    Hypoglycemia    Swelling LAST 30 DAYS DR [facility] AWARE   LEFT LEG AND FOOT   Past Surgical History:  Procedure Laterality Date   BOWEL DECOMPRESSION N/A 03/07/2021   Procedure: BOWEL DECOMPRESSION;  Surgeon: Yetta Flock, MD;  Location: [facility];  Service: Gastroenterology;   Laterality: N/A;   BOWEL DECOMPRESSION N/A 06/07/2021   Procedure: BOWEL DECOMPRESSION;  Surgeon: Carol Ada, MD;  Location: [facility];  Service: Gastroenterology;  Laterality: N/A;   FLEXIBLE SIGMOIDOSCOPY N/A 03/07/2021   Procedure: FLEXIBLE SIGMOIDOSCOPY;  Surgeon: Yetta Flock, MD;  Location: [facility];  Service: Gastroenterology;  Laterality: N/A;   FLEXIBLE SIGMOIDOSCOPY N/A 06/07/2021   Procedure: FLEXIBLE SIGMOIDOSCOPY;  Surgeon: Carol Ada, MD;  Location: [facility];  Service: Gastroenterology;  Laterality: N/A;   GUM SURGERY     TEETH IMPLANTS ALSO   LAPAROSCOPIC SIGMOID COLECTOMY N/A 06/10/2021   Procedure: LAPAROSCOPIC ASSISTED SIGMOID COLECTOMY;  Surgeon: Johnathan Hausen, MD;  Location: [facility];  Service: General;  Laterality: N/A;   ORCHIECTOMY Bilateral 01/03/2015   Procedure: ORCHIECTOMY;  Surgeon: Alexis Frock, MD;  Location: [facility];  Service: Urology;  Laterality: Bilateral;   Patient Active Problem List   Diagnosis Date Noted   Right foot injury, initial encounter 01/14/2022   Sigmoid volvulus ([place]) 06/07/2021  Generalized abdominal tenderness without rebound tenderness 03/07/2021   Iron deficiency anemia 03/07/2021   Diuretic-induced hypokalemia 12/27/2019   Tinnitus of both ears 09/07/2019   Laryngopharyngeal reflux (LPR) 09/07/2019   Essential hypertension 03/30/2018   Port-A-Cath in place 08/25/2017   Hyperlipidemia LDL goal <100 03/16/2017   Atherosclerosis of aorta ([place]) 03/11/2017   Chronic idiopathic constipation 03/11/2017   Seasonal allergic rhinitis due to pollen 03/11/2017   Gastroesophageal reflux disease without esophagitis 03/11/2017   Malignant neoplasm of bone ([place]) 10/16/2015   Prostate cancer ([place]) 02/02/2015    PCP: [name], MD  REFERRING PROVIDER: [name], MD  REFERRING DIAG:  R26.81 (ICD-10-CM) - Gait instability C61 (ICD-10-CM) - Prostate cancer ([place])  THERAPY DIAG:  Unsteadiness on feet  Muscle  weakness (generalized)  Chronic pain of left knee  RATIONALE FOR EVALUATION AND TREATMENT: Rehabilitation  ONSET DATE: 2016  NEXT MD APPOINTMENT:  none scheduled   SUBJECTIVE:   SUBJECTIVE STATEMENT: Pt states his L knee feels sore and has done some light stretching to help. Otherwise he feels good.   PERTINENT HISTORY:  Advanced prostate CA with disease to bone dx 2016, anemia, anxiety  PAIN:  Are you having pain? Yes: NPRS scale: 4/10 Pain location: left knee  Pain description: ache Aggravating factors: being active Relieving factors: tylenol  PRECAUTIONS: Other: Active CA  WEIGHT BEARING RESTRICTIONS: No  FALLS:  Has patient fallen in last 6 months? 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     OBJECTIVE:   DIAGNOSTIC FINDINGS:  Left Knee xray 01/18/21 - No fracture or dislocation of the left knee. Mild medial compartment and patellofemoral arthrosis, with preserved lateral compartment. Probable loose bodies in the posterior joint space. Moderate knee joint effusion. Soft unremarkable.   IMPRESSION: No fracture or dislocation of the left knee. Mild medial compartment and patellofemoral arthrosis, with preserved lateral compartment. Probable loose bodies in the posterior joint space. Moderate knee joint effusion.   PATIENT SURVEYS:  ABC scale 99.38%  COGNITION: Overall cognitive status: Within functional limits for tasks assessed     SENSATION: WFL  EDEMA:  Circumferential: R 41.5 cm  L 44 cm  MUSCLE LENGTH: HS: marked bil tightness Quads: ITB: mod tightness L Piriformis: WNL  Hip Flexors: Heelcords: marked bil  POSTURE:  decreased lumbar lordosis and weight shift left  PALPATION: Palpation: TTP at Lt glute min/med, bil lumbar; mild left ITB. Spinal Mobility: Patellar Mobility: WNL L  LUMBAR ROM:  WFL  except bil rot R 75%, L 50% with less weight shift  LOWER EXTREMITY ROM:  WNL except bil ankle DF limited by tight heel cords.  LOWER EXTREMITY MMT: decreased core strength with MMT.  MMT Right eval Left eval Right 02/12/22 Left 02/12/22 Right 03/20/22 Left 03/20/22  Hip flexion 4+ 4+* 4+ 4+ 4+ 4+  Hip extension 4 4+ 4+ 4+ 4+ 4+  Hip abduction 5 4+ '5 5 5 5  '$ Hip adduction     5 5  Hip internal rotation        Hip external rotation        Knee flexion 5 5* 5 5* 5 5  Knee extension '5 5 5 5 5 5  '$ Ankle dorsiflexion  $'5 5 5 'W$ 5* 5 5   (Blank rows = not tested)  *pain  UE MMT:  Bil shoulder flex and ABD 5/5, ext 4+/5  FUNCTIONAL TESTS:  5 times sit to stand: 22.73 with mild pain bil knees; 17.25 sec (02/12/22) Timed up and go (TUG): 11.53 sec (01/31/22) 10 meter walk test: 8.44 (01/31/22) - Gait speed = 3.89 ft/sec Berg Balance Scale: 52/56 (01/31/22); 53/56 (02/12/22 ) Functional gait assessment: 25/30 (01/31/22); 26/30 (02/12/22); 29/30 (03/20/22)   GAIT: Distance walked: 40 Assistive device utilized: None Level of assistance: Complete Independence Comments: antalgic   TODAY'S TREATMENT:                                                                                                                              DATE:    03/20/22 THERAPEUTIC EXERCISE: to improve flexibility, strength and mobility.  Verbal and tactile cues throughout for technique. UBE in standing - L3.0 x 6 min (3' each fwd & back) Seated Knee Extension/Flexion #20 for flexion, #5 for extension 2x10 each   THERAPEUTIC ACTIVITIES: reassessment of goals  FGA: 29/30  LE MMT   NEUROMUSCULAR RE-EDUCATION: To improve balance, proprioception, and reduce fall risk. Standing on Airex pad: EO with normal BOS 2x 30", arms crossed over  chest EC with normal BOS 2x 30" EO with narrow BOS x 30" EC with narrow BOS x 30"- PT guarding  EO with narrow BOS + horizontal/vertical head turns x 30" Cone Taps 3-way: bilat down and back, (1 w/chair support, 1 w/out chair support)    03/14/22 THERAPEUTIC EXERCISE: to improve flexibility, strength and mobility.  Verbal and tactile cues throughout for technique.  UBE in standing - L3.0 x 6 min (3' each fwd & back) Seated GTB scap retraction + B shoulder ER 10 x 3", 2 sets Seated GTB scap retraction + B shoulder horiz ABD 10 x 3", 2 sets Seated GTB rows 10 x 3", 2 sets Standing RTB march with looped band at midfeet 2 x 10 R hip hike/lower over edge of 4" step with heel tap to floor 2 x 10, attempted on L but unable due to L knee pain  NEUROMUSCULAR RE-EDUCATION: To improve balance, proprioception, and reduce fall risk. Standing on Airex pad: EO with normal BOS x 30" EC with normal BOS x 30" EO with narrow BOS x 30" EC with narrow BOS x ~7" - LOB x 2 attempts requiring PT assistance to prevent fall EO with narrow BOS + horizontal/vertical head turns x 5 each direction EO with narrow BOS + upper trunk rotation with arms folded across chest x 5 each direction   02/12/22 THERAPEUTIC EXERCISE: to improve flexibility, strength and mobility.  Verbal and tactile cues throughout for technique. NuStep L5 x 6 min S/L Open Book Stretch R/L 2x10- cues to open chest fully Supine shoulder horizontal abduction 1x10 Standing blance with 1/2 clock on R/L- 2x5- LOB posteriorly 2x during stepping back motion  THERAPEUTIC  ACTIVITIES: Berg Balance Scale: 53/56 FGA: 26/30 5xSTS: 17.25 sec   PATIENT EDUCATION:  Education details: progress with PT, ongoing PT POC, HEP review, and reassessment findings  Person educated: Patient Education method: Explanation Education comprehension: verbalized understanding   HOME EXERCISE PROGRAM: Access Code: [email or web address] URL:  [email or web address] Date: 03/14/2022 Prepared by: Annie Paras  Exercises - Sit to Stand  - 3 x daily - 7 x weekly - 1-2 sets - 5 reps - Gastroc Stretch on Wall  - 2 x daily - 7 x weekly - 1 sets - 3 reps - 30-60 sec hold - Soleus Stretch on Wall  - 2 x daily - 7 x weekly - 1 sets - 3 reps - 30-60 sec hold - Supine Bridge  - 2 x daily - 7 x weekly - 1-3 sets - 10 reps - Standing Shoulder Diagonal Horizontal Abduction 60/120 Degrees with Resistance  - 1 x daily - 4 x weekly - 2 sets - 10 reps - 3 sec hold - Standing Shoulder Horizontal Abduction with Resistance  - 1 x daily - 4 x weekly - 2 sets - 10 reps - 3 sec hold - Standing Bilateral Low Shoulder Row with Anchored Resistance  - 1 x daily - 4 x weekly - 2 sets - 10 reps - 5 sec hold - Scapular Retraction with Resistance Advanced  - 1 x daily - 4 x weekly - 2 sets - 10 reps - 5 sec hold - Standing Hip Flexion with Anchored Resistance and Chair Support  - 1 x daily - 4 x weekly - 2 sets - 10 reps - 3 sec hold - Standing Hip Adduction with Anchored Resistance  - 1 x daily - 4 x weekly - 2 sets - 10 reps - 3 sec hold - Standing Hip Extension with Anchored Resistance  - 1 x daily - 4 x weekly - 2 sets - 10 reps - 3 sec hold - Standing Hip Abduction with Anchored Resistance  - 1 x daily - 4 x weekly - 2 sets - 10 reps - 3 sec hold - Wall Push Up  - 1 x daily - 4 x weekly - 3 sets - 10 reps - Side Stepping with Resistance at Ankles  - 1 x daily - 4 x weekly - 3 sets - 10 reps - Marching with Resistance  - 1 x daily - 4 x weekly - 2 sets - 10 reps - 3 sec hold   ASSESSMENT:  CLINICAL IMPRESSION:  Vartan came into the clinic today feeling some stiffness in his L knee which he attributed to the cold weather. Overall pain and discomfort was minimal to begin with and continued for the rest of the session. Today's session included reassessment of his balance demonstrated by the FGA which he met and surpassed his LTG with a result of  29/30, showing his progress across the last couple of weeks. [place] continues to improve in his balance exercises as he was able to withstand 30" on the airex EC, better than last week's time of 7". The overall strength of his LE continues to progress with the main focus being on increasing his hip flexion and extension strength. Today the focus was on his quadriceps and hamstrings in order to increase his stability. Overall, Tejon continues to work with his HEP and is motivated to continue to progress in his POC.   OBJECTIVE IMPAIRMENTS: Abnormal gait, decreased balance, decreased ROM, decreased strength, increased edema, increased muscle spasms, impaired flexibility, and  pain.   ACTIVITY LIMITATIONS: locomotion level  PARTICIPATION LIMITATIONS: yard work  PERSONAL FACTORS: Age and 3+ comorbidities: Advanced prostate CA with disease to bone dx 2016, anemia, anxiety  are also affecting patient's functional outcome.   REHAB POTENTIAL: Good  CLINICAL DECISION MAKING: Evolving/moderate complexity  EVALUATION COMPLEXITY: Low   GOALS: Goals reviewed with patient? Yes  SHORT TERM GOALS: Target date: 03/12/22  Patient will be independent with initial HEP. Baseline:  Goal status: MET  02/05/22  2.  Patient will demonstrate decreased fall risk by scoring < 25 sec on TUG. Baseline:  Goal status: MET  01/31/22  3.  BERG/TUG/DGI assessed  Baseline:  Goal status: MET  01/31/22  LONG TERM GOALS: Target date: 04/09/2022  Patient will be independent with advanced/ongoing HEP to improve outcomes and carryover.  Baseline:  Goal status: MET 02/12/22  2.  Patient will be able to ambulate 600' with LRAD with good safety to access community.  Baseline:  Goal status: MET 02/12/22  3.  Patient will be able to step up/down curb safely with LRAD for safety with community ambulation.  Baseline:  Goal status: MET 02/12/22  4.  Patient will demonstrate improved functional LE strength as demonstrated by  improved 5xSTS to </= 12.6 sec. Baseline: 22.73 Goal status: IN PROGRESS 02/12/22 - 17.25 sec  5.  Patient will demonstrate at least 28/30 on FGA to improve gait stability and reduce risk for falls. Baseline: 25/30 (01/31/22),02/12/22 26/30 Goal status: MET 03/20/22 - 29/30   6.  Patient will score >/= 55/56 on Berg Balance test to demonstrate lower risk of falls. .  Baseline: 52/56 Goal status: IN PROGRESS 02/12/22 - 53/56  7.  Pt will report decreased knee pain by >/= 50% with gait and ADLS to improve QOL. Baseline:  Goal status: IN PROGRESS 02/12/22 - Pt reports knee pain has decreased by 5%.  8.  Patient will report improved overall endurance with ADLs by 50%. Baseline:  Goal status: IN PROGRESS 02/12/22 - Pt reports 10% improvement in endurance.    PLAN:  PT FREQUENCY: 2x/week  PT DURATION: 8 weeks  PLANNED INTERVENTIONS: Therapeutic exercises, Therapeutic activity, Neuromuscular re-education, Balance training, Gait training, Patient/Family education, Self Care, Joint mobilization, Stair training, Aquatic Therapy, Dry Needling, Electrical stimulation, Cryotherapy, Taping, Ionotophoresis '4mg'$ /ml Dexamethasone, and Manual therapy  PLAN FOR NEXT SESSION: Reassess STS, Begin to prepare for transition to HEP at end of current POC, Focus on hip flexion and extension exercises, balance and core strengthening, Ask how his visit with MD (03/19/22) went, Inquire if he wants to reschedule 04/10/22 visit for earlier in the week (prior to end of cert on 07/07/19)   Nicola Heinemann Romero-Perozo, Student-PT 03/20/2022, 10:52 AM

## 2022-03-26 ENCOUNTER — Ambulatory Visit (INDEPENDENT_AMBULATORY_CARE_PROVIDER_SITE_OTHER): Payer: Medicare Other | Admitting: Psychologist

## 2022-03-26 DIAGNOSIS — F411 Generalized anxiety disorder: Secondary | ICD-10-CM | POA: Diagnosis not present

## 2022-03-26 LAB — PSA: PSA: 0

## 2022-03-26 NOTE — Progress Notes (Signed)
[place] Counselor/Therapist Progress Note  Patient ID: Philip [name], MRN: [id number],    Date: 03/26/2022  Time Spent: 09:04 am to 09:42 am; total time: 38 minutes   This session was held via in person. The patient consented to in-person therapy and was in the clinician's office. Limits of confidentiality were discussed with the patient.   Treatment Type: Individual Therapy  Reported Symptoms: Less anxiety  Mental Status Exam: Appearance:  Well Groomed     Behavior: Appropriate  Motor: Normal  Speech/Language:  Clear and Coherent  Affect: Appropriate  Mood: normal  Thought process: normal  Thought content:   WNL  Sensory/Perceptual disturbances:   WNL  Orientation: oriented to person, place, and time/date  Attention: Good  Concentration: Good  Memory: WNL  Fund of knowledge:  Good  Insight:   Good  Judgment:  Good  Impulse Control: Good   Risk Assessment: Danger to Self:  No Self-injurious Behavior: No Danger to Others: No Duty to Warn:no Physical Aggression / Violence:No  Access to Firearms a concern: No  Gang Involvement:No   Subjective: Beginning the session, patient described himself as doing well while reflecting on the holidays. Continuing to talk, he acknowledged that it was difficult to say goodbye to Dr. Alen Blew and reflected on how meaning that relationship was to him. From there, he reflected on challenges related to sleep. He was agreeable to trying an intervention to assist with sleep. He processed thoughts and emotions. He was agreeable to following up. He denied suicidal and homicidal ideation.    Interventions:  Worked on developing a therapeutic relationship with the patient using active listening and reflective statements. Provided emotional support using empathy and validation. Reviewed the treatment plan with the patient. Praised the patient for doing well and explored what has assisted the patient. Processed emotions related to the  holidays. Identified goals for the session. Reflected on the strong relationship patient had with Dr. Alen Blew and validated thoughts and emotions related to Dr. Alen Blew leaving. Explored barriers to sleep. Used socratic questions to assist the patient. Assisted in problem solving. Provided empathic statements.  Assessed for suicidal and homicidal ideation.   Homework: Listen to ocean sounds  Next Session: Review homework and emotional support.  Diagnosis: F41.1 generalized anxiety disorder.   Plan:   Goals Reduce overall frequency, intensity, and duration of anxiety Stabilize anxiety level wile increasing ability to function Enhance ability to effectively cope with full variety of stressors Learn and implement coping skills that result in a reduction of anxiety   Objectives target date for all objectives is 10/15/2022 Verbalize an understanding of the cognitive, physiological, and behavioral components of anxiety Learning and implement calming skills to reduce overall anxiety Verbalize an understanding of the role that cognitive biases play in excessive irrational worry and persistent anxiety symptoms Identify, challenge, and replace based fearful talk Learn and implement problem solving strategies Identify and engage in pleasant activities Learning and implement personal and interpersonal skills to reduce anxiety and improve interpersonal relationships Learn to accept limitations in life and commit to tolerating, rather than avoiding, unpleasant emotions while accomplishing meaningful goals Identify major life conflicts from the past and present that form the basis for present anxiety Maintain involvement in work, family, and social activities Reestablish a consistent sleep-wake cycle Cooperate with a medical evaluation  Interventions Engage the patient in behavioral activation Use instruction, modeling, and role-playing to build the client's general social, communication, and/or  conflict resolution skills Use Acceptance and Commitment Therapy to help client  accept uncomfortable realities in order to accomplish value-consistent goals Reinforce the client's insight into the role of his/her past emotional pain and present anxiety  Support the client in following through with work, family, and social activities Teach and implement sleep hygiene practices  Refer the patient to a physician for a psychotropic medication consultation Monito the clint's psychotropic medication compliance Discuss how anxiety typically involves excessive worry, various bodily expressions of tension, and avoidance of what is threatening that interact to maintain the problem  Teach the patient relaxation skills Assign the patient homework Discuss examples demonstrating that unrealistic worry overestimates the probability of threats and underestimates patient's ability  Assist the patient in analyzing his or her worries Help patient understand that avoidance is reinforcing     The patient and clinician reviewed the treatment plan on 11/19/2021. The patient approved of the treatment plan.   Conception Chancy, PsyD

## 2022-03-27 ENCOUNTER — Ambulatory Visit: Payer: Medicare Other | Admitting: Physical Therapy

## 2022-03-27 ENCOUNTER — Encounter: Payer: Self-pay | Admitting: Physical Therapy

## 2022-03-27 DIAGNOSIS — M6281 Muscle weakness (generalized): Secondary | ICD-10-CM | POA: Diagnosis not present

## 2022-03-27 DIAGNOSIS — G8929 Other chronic pain: Secondary | ICD-10-CM

## 2022-03-27 DIAGNOSIS — M25562 Pain in left knee: Secondary | ICD-10-CM | POA: Diagnosis not present

## 2022-03-27 DIAGNOSIS — R2681 Unsteadiness on feet: Secondary | ICD-10-CM | POA: Diagnosis not present

## 2022-03-27 NOTE — Therapy (Signed)
OUTPATIENT PHYSICAL THERAPY TREATMENT   Patient Name: Philip [name] MRN: [id number] DOB:01/15/1946, 77 y.o., male Today's Date: 03/27/2022     END OF SESSION:  PT End of Session - 03/27/22 1013     Visit Number 8    Date for PT Re-Evaluation 04/09/22    Authorization Type MCR    Progress Note Due on Visit 15    PT Start Time 1015    PT Stop Time 1100    PT Time Calculation (min) 45 min    Activity Tolerance Patient tolerated treatment well    Behavior During Therapy [facility] for tasks assessed/performed                 Past Medical History:  Diagnosis Date   Atherosclerosis of aorta ([place]) 03/11/2017   The 10-year ASCVD risk score Mikey Bussing DC Jr., et al., 2013) is: 24.6%   Values used to calculate the score:     Age: 60 years     Sex: Male     Is Non-Hispanic African American: No     Diabetic: No     Tobacco smoker: No     Systolic Blood Pressure: 325 mmHg     Is BP treated: No     HDL Cholesterol: 71.1 mg/dL     Total Cholesterol: 212 mg/dL   Cancer (HCC)    PROSTATE   Chronic idiopathic constipation 03/11/2017   Edema leg Sept 28, 2016   left leg from foot to thigh, increasinly worse over last 4 weeks   Essential hypertension 03/30/2018   Gastroesophageal reflux disease without esophagitis 03/11/2017   Hyperlipidemia LDL goal <100 03/16/2017   The 10-year ASCVD risk score Mikey Bussing DC Jr., et al., 2013) is: 30.6%   Values used to calculate the score:     Age: 45 years     Sex: Male     Is Non-Hispanic African American: No     Diabetic: No     Tobacco smoker: No     Systolic Blood Pressure: 498 mmHg     Is BP treated: Yes     HDL Cholesterol: 69.3 mg/dL     Total Cholesterol: 211 mg/dL   Hypertension    Hypoglycemia    Swelling LAST 30 DAYS DR [facility] AWARE   LEFT LEG AND FOOT   Past Surgical History:  Procedure Laterality Date   BOWEL DECOMPRESSION N/A 03/07/2021   Procedure: BOWEL DECOMPRESSION;  Surgeon: Yetta Flock, MD;  Location: [facility];  Service: Gastroenterology;   Laterality: N/A;   BOWEL DECOMPRESSION N/A 06/07/2021   Procedure: BOWEL DECOMPRESSION;  Surgeon: Carol Ada, MD;  Location: [facility];  Service: Gastroenterology;  Laterality: N/A;   FLEXIBLE SIGMOIDOSCOPY N/A 03/07/2021   Procedure: FLEXIBLE SIGMOIDOSCOPY;  Surgeon: Yetta Flock, MD;  Location: [facility];  Service: Gastroenterology;  Laterality: N/A;   FLEXIBLE SIGMOIDOSCOPY N/A 06/07/2021   Procedure: FLEXIBLE SIGMOIDOSCOPY;  Surgeon: Carol Ada, MD;  Location: [facility];  Service: Gastroenterology;  Laterality: N/A;   GUM SURGERY     TEETH IMPLANTS ALSO   LAPAROSCOPIC SIGMOID COLECTOMY N/A 06/10/2021   Procedure: LAPAROSCOPIC ASSISTED SIGMOID COLECTOMY;  Surgeon: Johnathan Hausen, MD;  Location: [facility];  Service: General;  Laterality: N/A;   ORCHIECTOMY Bilateral 01/03/2015   Procedure: ORCHIECTOMY;  Surgeon: Alexis Frock, MD;  Location: [facility];  Service: Urology;  Laterality: Bilateral;   Patient Active Problem List   Diagnosis Date Noted   Right foot injury, initial encounter 01/14/2022   Sigmoid volvulus ([place]) 06/07/2021  Generalized abdominal tenderness without rebound tenderness 03/07/2021   Iron deficiency anemia 03/07/2021   Diuretic-induced hypokalemia 12/27/2019   Tinnitus of both ears 09/07/2019   Laryngopharyngeal reflux (LPR) 09/07/2019   Essential hypertension 03/30/2018   Port-A-Cath in place 08/25/2017   Hyperlipidemia LDL goal <100 03/16/2017   Atherosclerosis of aorta ([place]) 03/11/2017   Chronic idiopathic constipation 03/11/2017   Seasonal allergic rhinitis due to pollen 03/11/2017   Gastroesophageal reflux disease without esophagitis 03/11/2017   Malignant neoplasm of bone ([place]) 10/16/2015   Prostate cancer ([place]) 02/02/2015    PCP: [name], MD  REFERRING PROVIDER: [name], MD  REFERRING DIAG:  R26.81 (ICD-10-CM) - Gait instability C61 (ICD-10-CM) - Prostate cancer ([place])  THERAPY DIAG:  Unsteadiness on feet  Muscle  weakness (generalized)  Chronic pain of left knee  RATIONALE FOR EVALUATION AND TREATMENT: Rehabilitation  ONSET DATE: 2016  NEXT MD APPOINTMENT:  none scheduled   SUBJECTIVE:   SUBJECTIVE STATEMENT: Says L knee isn't too bad today compared to previous sessions. Says rainy weather hasn't been good for his anxiety recently.   PERTINENT HISTORY:  Advanced prostate CA with disease to bone dx 2016, anemia, anxiety  PAIN:  Are you having pain? Yes: NPRS scale: 2/10 Pain location: left knee  Pain description: ache Aggravating factors: being active Relieving factors: tylenol  PRECAUTIONS: Other: Active CA  WEIGHT BEARING RESTRICTIONS: No  FALLS:  Has patient fallen in last 6 months? 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     OBJECTIVE:   DIAGNOSTIC FINDINGS:  Left Knee xray 01/18/21 - No fracture or dislocation of the left knee. Mild medial compartment and patellofemoral arthrosis, with preserved lateral compartment. Probable loose bodies in the posterior joint space. Moderate knee joint effusion. Soft unremarkable.   IMPRESSION: No fracture or dislocation of the left knee. Mild medial compartment and patellofemoral arthrosis, with preserved lateral compartment. Probable loose bodies in the posterior joint space. Moderate knee joint effusion.   PATIENT SURVEYS:  ABC scale 99.38%  COGNITION: Overall cognitive status: Within functional limits for tasks assessed     SENSATION: WFL  EDEMA:  Circumferential: R 41.5 cm  L 44 cm  MUSCLE LENGTH: HS: marked bil tightness Quads: ITB: mod tightness L Piriformis: WNL  Hip Flexors: Heelcords: marked  bil  POSTURE: decreased lumbar lordosis and weight shift left  PALPATION: Palpation: TTP at Lt glute min/med, bil lumbar; mild left ITB. Spinal Mobility: Patellar Mobility: WNL L  LUMBAR ROM:  WFL  except bil rot R 75%, L 50% with less weight shift  LOWER EXTREMITY ROM:  WNL except bil ankle DF limited by tight heel cords.  LOWER EXTREMITY MMT: decreased core strength with MMT.  MMT Right eval Left eval Right 02/12/22 Left 02/12/22 Right 03/20/22 Left 03/20/22  Hip flexion 4+ 4+* 4+ 4+ 4+ 4+  Hip extension 4 4+ 4+ 4+ 4+ 4+  Hip abduction 5 4+ '5 5 5 5  '$ Hip adduction     5 5  Hip internal rotation        Hip external rotation        Knee flexion 5 5* 5 5* 5 5  Knee extension '5 5 5 5 5 '$ 5  Ankle dorsiflexion '5 5 5 '$ 5* 5 5   (Blank rows = not tested)  *pain  UE MMT:  Bil shoulder flex and ABD 5/5, ext 4+/5  FUNCTIONAL TESTS:  5 times sit to stand: 22.73 with mild pain bil knees; 17.25 sec (02/12/22) Timed up and go (TUG): 11.53 sec (01/31/22) 10 meter walk test: 8.44 (01/31/22) - Gait speed = 3.89 ft/sec Berg Balance Scale: 52/56 (01/31/22); 53/56 (02/12/22 ) Functional gait assessment: 25/30 (01/31/22); 26/30 (02/12/22); 29/30 (03/20/22)   GAIT: Distance walked: 40 Assistive device utilized: None Level of assistance: Complete Independence Comments: antalgic   TODAY'S TREATMENT:                                                                                                                              DATE:    03/27/22 THERAPEUTIC EXERCISE: to improve flexibility, strength and mobility.  Verbal and tactile cues throughout for technique. UBE in standing - L3.0 x 6 min (3' each fwd & back) Seated Knee Extension/Flexion #20 for flexion, #5 for extension 2x10 each  Leg Press Machine #25 x5, did not like the way it made him sit so decided to stop  THERAPEUTIC ACTIVITIES: Berg Balance Scale: 55/56 5xSTS: defer for next time due to L knee hurting   NEUROMUSCULAR RE-EDUCATION:  To improve balance, proprioception, and reduce fall risk. Cone Taps 3-way: bilat down and back(standing on L knee hurt so deferred to just standing on R)  3 Cone Knock down & Pick Up SLS; x2 on R leg, x1 on L leg but made knee hurt too much  Standing on Airex pad: EC with normal BOS 1x 30" EO with narrow BOS 1x 30" EC with narrow BOS 1x 30"- PT guarding  EO with narrow BOS + horizontal/vertical head turns x 30" EO with narrow BOS+ trunk rotation w/ arms crossed over chest x30"   03/20/22 THERAPEUTIC EXERCISE: to improve flexibility, strength and mobility.  Verbal and tactile cues throughout for technique. UBE in standing - L3.0 x 6 min (3' each fwd & back) Seated Knee Extension/Flexion #20 for flexion, #5 for extension 2x10 each   THERAPEUTIC ACTIVITIES: reassessment of goals  FGA: 29/30  LE MMT  NEUROMUSCULAR RE-EDUCATION: To improve balance, proprioception, and reduce fall risk. Standing on Airex pad: EO with normal BOS 2x 30", arms crossed over chest EC with normal BOS 2x 30" EO with narrow BOS x 30" EC with narrow BOS x 30"- PT guarding  EO with narrow BOS + horizontal/vertical head turns x 30" Cone Taps 3-way: bilat down and back, (1 w/chair support, 1 w/out chair support)    03/14/22 THERAPEUTIC EXERCISE: to improve flexibility, strength and mobility.  Verbal and tactile cues throughout for technique.  UBE in standing - L3.0 x 6 min (3' each fwd & back) Seated GTB scap retraction + B shoulder ER 10 x 3", 2 sets Seated GTB scap retraction + B shoulder horiz ABD 10 x 3", 2 sets  Seated GTB rows 10 x 3", 2 sets Standing RTB march with looped band at midfeet 2 x 10 R hip hike/lower over edge of 4" step with heel tap to floor 2 x 10, attempted on L but unable due to L knee pain  NEUROMUSCULAR RE-EDUCATION: To improve balance, proprioception, and reduce fall risk. Standing on Airex pad: EO with normal BOS x 30" EC with normal BOS x 30" EO with narrow BOS x 30" EC with narrow  BOS x ~7" - LOB x 2 attempts requiring PT assistance to prevent fall EO with narrow BOS + horizontal/vertical head turns x 5 each direction EO with narrow BOS + upper trunk rotation with arms folded across chest x 5 each direction   PATIENT EDUCATION:  Education details: progress with PT and ongoing PT POC Person educated: Patient Education method: Explanation Education comprehension: verbalized understanding   HOME EXERCISE PROGRAM: Access Code: [email or web address] URL: [email or web address] Date: 03/14/2022 Prepared by: Annie Paras  Exercises - Sit to Stand  - 3 x daily - 7 x weekly - 1-2 sets - 5 reps - Gastroc Stretch on Wall  - 2 x daily - 7 x weekly - 1 sets - 3 reps - 30-60 sec hold - Soleus Stretch on Wall  - 2 x daily - 7 x weekly - 1 sets - 3 reps - 30-60 sec hold - Supine Bridge  - 2 x daily - 7 x weekly - 1-3 sets - 10 reps - Standing Shoulder Diagonal Horizontal Abduction 60/120 Degrees with Resistance  - 1 x daily - 4 x weekly - 2 sets - 10 reps - 3 sec hold - Standing Shoulder Horizontal Abduction with Resistance  - 1 x daily - 4 x weekly - 2 sets - 10 reps - 3 sec hold - Standing Bilateral Low Shoulder Row with Anchored Resistance  - 1 x daily - 4 x weekly - 2 sets - 10 reps - 5 sec hold - Scapular Retraction with Resistance Advanced  - 1 x daily - 4 x weekly - 2 sets - 10 reps - 5 sec hold - Standing Hip Flexion with Anchored Resistance and Chair Support  - 1 x daily - 4 x weekly - 2 sets - 10 reps - 3 sec hold - Standing Hip Adduction with Anchored Resistance  - 1 x daily - 4 x weekly - 2 sets - 10 reps - 3 sec hold - Standing Hip Extension with Anchored Resistance  - 1 x daily - 4 x weekly - 2 sets - 10 reps - 3 sec hold - Standing Hip Abduction with Anchored Resistance  - 1 x daily - 4 x weekly - 2 sets - 10 reps - 3 sec hold - Wall Push Up  - 1 x daily - 4 x weekly - 3 sets - 10 reps - Side Stepping with Resistance at Ankles  - 1 x daily - 4 x weekly - 3 sets  - 10 reps - Marching with Resistance  - 1 x daily - 4 x weekly - 2 sets - 10 reps - 3 sec hold   ASSESSMENT:  CLINICAL IMPRESSION: Corin came into today's session with a slight ache in his L knee but less when compared to previous visits. Pt was made aware of his end of POC date and was introduced to the idea of possible d/c but was told that decision would be made in the next two visits. Reassessment of the Berg Balance test was conducted and pt  met his LTG now. The 5xSTS was attempted but due to pt's L knee pain it was deferred to next session to ensure an accurate result. LE strengthening followed, trying to continue working on increasing his strength and endurance. The session concluded with balance training on the Airex with both EO and EC and the pt verbalized he felt more confident doing these exercises that just three sessions ago were troublesome for him. Pt is on track to finishing his POC according to the timeline originally given.   OBJECTIVE IMPAIRMENTS: Abnormal gait, decreased balance, decreased ROM, decreased strength, increased edema, increased muscle spasms, impaired flexibility, and pain.   ACTIVITY LIMITATIONS: locomotion level  PARTICIPATION LIMITATIONS: yard work  PERSONAL FACTORS: Age and 3+ comorbidities: Advanced prostate CA with disease to bone dx 2016, anemia, anxiety  are also affecting patient's functional outcome.   REHAB POTENTIAL: Good  CLINICAL DECISION MAKING: Evolving/moderate complexity  EVALUATION COMPLEXITY: Low   GOALS: Goals reviewed with patient? Yes  SHORT TERM GOALS: Target date: 03/12/22  Patient will be independent with initial HEP. Baseline:  Goal status: MET  02/05/22  2.  Patient will demonstrate decreased fall risk by scoring < 25 sec on TUG. Baseline:  Goal status: MET  01/31/22  3.  BERG/TUG/DGI assessed  Baseline:  Goal status: MET  01/31/22  LONG TERM GOALS: Target date: 04/09/2022  Patient will be independent with  advanced/ongoing HEP to improve outcomes and carryover.  Baseline:  Goal status: MET 02/12/22  2.  Patient will be able to ambulate 600' with LRAD with good safety to access community.  Baseline:  Goal status: MET 02/12/22  3.  Patient will be able to step up/down curb safely with LRAD for safety with community ambulation.  Baseline:  Goal status: MET 02/12/22  4.  Patient will demonstrate improved functional LE strength as demonstrated by improved 5xSTS to </= 12.6 sec. Baseline: 22.73 Goal status: IN PROGRESS 02/12/22 - 17.25 sec  5.  Patient will demonstrate at least 28/30 on FGA to improve gait stability and reduce risk for falls. Baseline: 25/30 (01/31/22),02/12/22 26/30 Goal status: MET 03/20/22 - 29/30   6.  Patient will score >/= 55/56 on Berg Balance test to demonstrate lower risk of falls. .  Baseline: 52/56 Goal status: MET 03/27/22 - 55/56   7.  Pt will report decreased knee pain by >/= 50% with gait and ADLS to improve QOL. Baseline:  Goal status: IN PROGRESS 02/12/22 - Pt reports knee pain has decreased by 5%.  8.  Patient will report improved overall endurance with ADLs by 50%. Baseline:  Goal status: IN PROGRESS 02/12/22 - Pt reports 10% improvement in endurance.    PLAN:  PT FREQUENCY: 2x/week  PT DURATION: 8 weeks  PLANNED INTERVENTIONS: Therapeutic exercises, Therapeutic activity, Neuromuscular re-education, Balance training, Gait training, Patient/Family education, Self Care, Joint mobilization, Stair training, Aquatic Therapy, Dry Needling, Electrical stimulation, Cryotherapy, Taping, Ionotophoresis '4mg'$ /ml Dexamethasone, and Manual therapy  PLAN FOR NEXT SESSION: Begin to prepare for transition to HEP at end of current POC, reassess STS test knee permitting, ask about gym update and begin to incorporate exercises that could work there. Focus on balance training that he still struggles with.   Kristapher [place] Romero-Perozo, Student-PT 03/27/2022, 11:18 AM

## 2022-04-03 ENCOUNTER — Encounter: Payer: Self-pay | Admitting: Physical Therapy

## 2022-04-03 ENCOUNTER — Ambulatory Visit: Payer: Medicare Other | Attending: Oncology | Admitting: Physical Therapy

## 2022-04-03 DIAGNOSIS — R2681 Unsteadiness on feet: Secondary | ICD-10-CM | POA: Insufficient documentation

## 2022-04-03 DIAGNOSIS — M6281 Muscle weakness (generalized): Secondary | ICD-10-CM | POA: Insufficient documentation

## 2022-04-03 DIAGNOSIS — G8929 Other chronic pain: Secondary | ICD-10-CM | POA: Insufficient documentation

## 2022-04-03 DIAGNOSIS — M25562 Pain in left knee: Secondary | ICD-10-CM | POA: Diagnosis not present

## 2022-04-03 NOTE — Therapy (Signed)
OUTPATIENT PHYSICAL THERAPY TREATMENT   Patient Name: Philip [name] MRN: [id number] DOB:September 19, 1945, 77 y.o., male Today's Date: 04/03/2022     END OF SESSION:  PT End of Session - 04/03/22 1013     Visit Number 9    Date for PT Re-Evaluation 04/09/22    Authorization Type MCR    Progress Note Due on Visit 15    PT Start Time 1014    PT Stop Time 1103    PT Time Calculation (min) 49 min    Activity Tolerance Patient tolerated treatment well    Behavior During Therapy [facility] for tasks assessed/performed                  Past Medical History:  Diagnosis Date   Atherosclerosis of aorta ([place]) 03/11/2017   The 10-year ASCVD risk score Mikey Bussing DC Jr., et al., 2013) is: 24.6%   Values used to calculate the score:     Age: 86 years     Sex: Male     Is Non-Hispanic African American: No     Diabetic: No     Tobacco smoker: No     Systolic Blood Pressure: 948 mmHg     Is BP treated: No     HDL Cholesterol: 71.1 mg/dL     Total Cholesterol: 212 mg/dL   Cancer (HCC)    PROSTATE   Chronic idiopathic constipation 03/11/2017   Edema leg Sept 28, 2016   left leg from foot to thigh, increasinly worse over last 4 weeks   Essential hypertension 03/30/2018   Gastroesophageal reflux disease without esophagitis 03/11/2017   Hyperlipidemia LDL goal <100 03/16/2017   The 10-year ASCVD risk score Mikey Bussing DC Jr., et al., 2013) is: 30.6%   Values used to calculate the score:     Age: 78 years     Sex: Male     Is Non-Hispanic African American: No     Diabetic: No     Tobacco smoker: No     Systolic Blood Pressure: 546 mmHg     Is BP treated: Yes     HDL Cholesterol: 69.3 mg/dL     Total Cholesterol: 211 mg/dL   Hypertension    Hypoglycemia    Swelling LAST 30 DAYS DR [facility] AWARE   LEFT LEG AND FOOT   Past Surgical History:  Procedure Laterality Date   BOWEL DECOMPRESSION N/A 03/07/2021   Procedure: BOWEL DECOMPRESSION;  Surgeon: Yetta Flock, MD;  Location: [facility];  Service: Gastroenterology;   Laterality: N/A;   BOWEL DECOMPRESSION N/A 06/07/2021   Procedure: BOWEL DECOMPRESSION;  Surgeon: Carol Ada, MD;  Location: [facility];  Service: Gastroenterology;  Laterality: N/A;   FLEXIBLE SIGMOIDOSCOPY N/A 03/07/2021   Procedure: FLEXIBLE SIGMOIDOSCOPY;  Surgeon: Yetta Flock, MD;  Location: [facility];  Service: Gastroenterology;  Laterality: N/A;   FLEXIBLE SIGMOIDOSCOPY N/A 06/07/2021   Procedure: FLEXIBLE SIGMOIDOSCOPY;  Surgeon: Carol Ada, MD;  Location: [facility];  Service: Gastroenterology;  Laterality: N/A;   GUM SURGERY     TEETH IMPLANTS ALSO   LAPAROSCOPIC SIGMOID COLECTOMY N/A 06/10/2021   Procedure: LAPAROSCOPIC ASSISTED SIGMOID COLECTOMY;  Surgeon: Johnathan Hausen, MD;  Location: [facility];  Service: General;  Laterality: N/A;   ORCHIECTOMY Bilateral 01/03/2015   Procedure: ORCHIECTOMY;  Surgeon: Alexis Frock, MD;  Location: [facility];  Service: Urology;  Laterality: Bilateral;   Patient Active Problem List   Diagnosis Date Noted   Right foot injury, initial encounter 01/14/2022   Sigmoid volvulus ([place])  06/07/2021   Generalized abdominal tenderness without rebound tenderness 03/07/2021   Iron deficiency anemia 03/07/2021   Diuretic-induced hypokalemia 12/27/2019   Tinnitus of both ears 09/07/2019   Laryngopharyngeal reflux (LPR) 09/07/2019   Essential hypertension 03/30/2018   Port-A-Cath in place 08/25/2017   Hyperlipidemia LDL goal <100 03/16/2017   Atherosclerosis of aorta ([place]) 03/11/2017   Chronic idiopathic constipation 03/11/2017   Seasonal allergic rhinitis due to pollen 03/11/2017   Gastroesophageal reflux disease without esophagitis 03/11/2017   Malignant neoplasm of bone ([place]) 10/16/2015   Prostate cancer ([place]) 02/02/2015    PCP: [name], MD  REFERRING PROVIDER: [name], MD  REFERRING DIAG:  R26.81 (ICD-10-CM) - Gait instability C61 (ICD-10-CM) - Prostate cancer ([place])  THERAPY DIAG:  Unsteadiness on feet  Muscle  weakness (generalized)  Chronic pain of left knee  RATIONALE FOR EVALUATION AND TREATMENT: Rehabilitation  ONSET DATE: 2016  NEXT MD APPOINTMENT:  none scheduled   SUBJECTIVE:   SUBJECTIVE STATEMENT:  Knee has been feeling some pain since last week but overall is doing well.   PERTINENT HISTORY:  Advanced prostate CA with disease to bone dx 2016, anemia, anxiety  PAIN:  Are you having pain? Yes: NPRS scale: 1-2/10 Pain location: left knee  Pain description: ache Aggravating factors: being active Relieving factors: tylenol  PRECAUTIONS: Other: Active CA  WEIGHT BEARING RESTRICTIONS: No  FALLS:  Has patient fallen in last 6 months? 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     OBJECTIVE:   DIAGNOSTIC FINDINGS:  Left Knee xray 01/18/21 - No fracture or dislocation of the left knee. Mild medial compartment and patellofemoral arthrosis, with preserved lateral compartment. Probable loose bodies in the posterior joint space. Moderate knee joint effusion. Soft unremarkable.   IMPRESSION: No fracture or dislocation of the left knee. Mild medial compartment and patellofemoral arthrosis, with preserved lateral compartment. Probable loose bodies in the posterior joint space. Moderate knee joint effusion.   PATIENT SURVEYS:  ABC scale 99.38%  COGNITION: Overall cognitive status: Within functional limits for tasks assessed     SENSATION: WFL  EDEMA:  Circumferential: R 41.5 cm  L 44 cm  MUSCLE LENGTH: HS: marked bil tightness Quads: ITB: mod tightness L Piriformis: WNL  Hip Flexors: Heelcords: marked bil  POSTURE: decreased lumbar lordosis and  weight shift left  PALPATION: Palpation: TTP at Lt glute min/med, bil lumbar; mild left ITB. Spinal Mobility: Patellar Mobility: WNL L  LUMBAR ROM:  WFL  except bil rot R 75%, L 50% with less weight shift  LOWER EXTREMITY ROM:  WNL except bil ankle DF limited by tight heel cords.  LOWER EXTREMITY MMT: decreased core strength with MMT.  MMT Right eval Left eval Right 02/12/22 Left 02/12/22 Right 03/20/22 Left 03/20/22  Hip flexion 4+ 4+* 4+ 4+ 4+ 4+  Hip extension 4 4+ 4+ 4+ 4+ 4+  Hip abduction 5 4+ '5 5 5 5  '$ Hip adduction     5 5  Hip internal rotation        Hip external rotation        Knee flexion 5 5* 5 5* 5 5  Knee extension '5 5 5 5 5 5  '$ Ankle dorsiflexion 5  5 5 5* 5 5   (Blank rows = not tested)  *pain  UE MMT:  Bil shoulder flex and ABD 5/5, ext 4+/5  FUNCTIONAL TESTS:  5 times sit to stand: 22.73 with mild pain bil knees; 17.25 sec (02/12/22) Timed up and go (TUG): 11.53 sec (01/31/22) 10 meter walk test: 8.44 (01/31/22) - Gait speed = 3.89 ft/sec Berg Balance Scale: 52/56 (01/31/22); 53/56 (02/12/22 ) Functional gait assessment: 25/30 (01/31/22); 26/30 (02/12/22); 29/30 (03/20/22)   GAIT: Distance walked: 40 Assistive device utilized: None Level of assistance: Complete Independence Comments: antalgic   TODAY'S TREATMENT:                                                                                                                              DATE:    04/03/22:  THERAPEUTIC EXERCISE: to improve flexibility, strength and mobility.  Verbal and tactile cues throughout for technique. UBE in standing - L3.0 x 6 min (3' each fwd & back) Seated Knee Flx/Ext with BTB 2x10  Standing Shoulder ER with GTB x10 Standing Shoulder Horizontal Abduction  Lunges at counter x10 each, verbal and tactile cues given to maintain proper form Standing Marches w/ 10lb OH on one arm x10 each  NEUROMUSCULAR RE-EDUCATION: To improve balance, proprioception, and reduce fall  risk. Standing on Airex pad: EC with narrow BOS 2x 30" EO with narrow BOS 1x 30"- EO with narrow BOS + horizontal/vertical head turns x 30" EO with narrow BOS+ trunk rotation w/ arms crossed over chest x10  Tandem x30" each  3 Cone Knock down & Pick Up SLS; x2 on R leg, x1 on L leg due to continued popping and giving away  03/27/22 THERAPEUTIC EXERCISE: to improve flexibility, strength and mobility.  Verbal and tactile cues throughout for technique. UBE in standing - L3.0 x 6 min (3' each fwd & back) Seated Knee Extension/Flexion #20 for flexion, #5 for extension 2x10 each  Leg Press Machine #25 x5, did not like the way it made him sit so decided to stop  THERAPEUTIC ACTIVITIES: Berg Balance Scale: 55/56 5xSTS: defer for next time due to L knee hurting   NEUROMUSCULAR RE-EDUCATION: To improve balance, proprioception, and reduce fall risk. Cone Taps 3-way: bilat down and back(standing on L knee hurt so deferred to just standing on R)  3 [facility] Knock down & Pick Up SLS; x2 on R leg, x1 on L leg but made knee hurt too much  Standing on Airex pad: EC with normal BOS 1x 30" EO with narrow BOS 1x 30" EC with narrow BOS 1x 30"- PT guarding  EO with narrow BOS + horizontal/vertical head turns x 30" EO with narrow BOS+ trunk rotation w/ arms crossed over chest x30"   03/20/22 THERAPEUTIC EXERCISE: to improve flexibility, strength and mobility.  Verbal and tactile cues throughout for technique. UBE in standing - L3.0 x 6 min (3' each fwd & back) Seated Knee Extension/Flexion #20 for flexion, #5  for extension 2x10 each   THERAPEUTIC ACTIVITIES: reassessment of goals  FGA: 29/30  LE MMT  NEUROMUSCULAR RE-EDUCATION: To improve balance, proprioception, and reduce fall risk. Standing on Airex pad: EO with normal BOS 2x 30", arms crossed over chest EC with normal BOS 2x 30" EO with narrow BOS x 30" EC with narrow BOS x 30"- PT guarding  EO with narrow BOS + horizontal/vertical head turns x  30" Cone Taps 3-way: bilat down and back, (1 w/chair support, 1 w/out chair support)    PATIENT EDUCATION:  Education details: progress with PT, ongoing PT POC, and HEP update Person educated: Patient Education method: Explanation, Demonstration, Tactile cues, Verbal cues, and Handouts Education comprehension: verbalized understanding and returned demonstration   HOME EXERCISE PROGRAM: Access Code: [email or web address] URL: [email or web address] Date: 03/14/2022 Prepared by: Annie Paras  Exercises - Sit to Stand  - 3 x daily - 7 x weekly - 1-2 sets - 5 reps - Gastroc Stretch on Wall  - 2 x daily - 7 x weekly - 1 sets - 3 reps - 30-60 sec hold - Soleus Stretch on Wall  - 2 x daily - 7 x weekly - 1 sets - 3 reps - 30-60 sec hold - Supine Bridge  - 2 x daily - 7 x weekly - 1-3 sets - 10 reps - Standing Shoulder Diagonal Horizontal Abduction 60/120 Degrees with Resistance  - 1 x daily - 4 x weekly - 2 sets - 10 reps - 3 sec hold - Standing Bilateral Low Shoulder Row with Anchored Resistance  - 1 x daily - 4 x weekly - 2 sets - 10 reps - 5 sec hold - Scapular Retraction with Resistance Advanced  - 1 x daily - 4 x weekly - 2 sets - 10 reps - 5 sec hold - Standing Hip Flexion with Anchored Resistance and Chair Support  - 1 x daily - 4 x weekly - 2 sets - 10 reps - 3 sec hold - Standing Hip Adduction with Anchored Resistance  - 1 x daily - 4 x weekly - 2 sets - 10 reps - 3 sec hold - Standing Hip Extension with Anchored Resistance  - 1 x daily - 4 x weekly - 2 sets - 10 reps - 3 sec hold - Standing Hip Abduction with Anchored Resistance  - 1 x daily - 4 x weekly - 2 sets - 10 reps - 3 sec hold - Wall Push Up  - 1 x daily - 4 x weekly - 3 sets - 10 reps - Side Stepping with Resistance at Ankles  - 1 x daily - 4 x weekly - 3 sets - 10 reps - Marching with Resistance  - 1 x daily - 4 x weekly - 2 sets - 10 reps - 3 sec hold - Seated Hamstring Curl with Anchored Resistance  - 1 x daily - 7 x  weekly - 3 sets - 10 reps - Sitting Knee Extension with Resistance  - 1 x daily - 7 x weekly - 3 sets - 10 reps - Standing Shoulder Horizontal Abduction with Resistance  - 1 x daily - 7 x weekly - 3 sets - 10 reps - Shoulder External Rotation and Scapular Retraction with Resistance  - 1 x daily - 7 x weekly - 3 sets - 10 reps  ASSESSMENT:  CLINICAL IMPRESSION: Pt came into today's session with the usual ache in his L knee but it was nothing significant compared to previous sessions. Pt wanted  a review of the scapular retraction exercises and was given a handout for reference at home. Pt stated that he is not going to pursue a gym membership so he wanted to be given band and free weight exercises that would strengthen his legs. Today's session involved finding LE strengthening exercises  with the band and pt was given these to try at home and seemed to be satisfied with the chosen exercises. Pt continues to have a problem activating his L quad so his next session should involve an emphasis on TKE and related exercises to decrease how much his knee buckles and gives out while standing on the one leg. Balance training continues to progress and he is feeling more confident during gait. Pt is on track to finishing his POC according to the timeline originally given.    OBJECTIVE IMPAIRMENTS: Abnormal gait, decreased balance, decreased ROM, decreased strength, increased edema, increased muscle spasms, impaired flexibility, and pain.   ACTIVITY LIMITATIONS: locomotion level  PARTICIPATION LIMITATIONS: yard work  PERSONAL FACTORS: Age and 3+ comorbidities: Advanced prostate CA with disease to bone dx 2016, anemia, anxiety  are also affecting patient's functional outcome.   REHAB POTENTIAL: Good  CLINICAL DECISION MAKING: Evolving/moderate complexity  EVALUATION COMPLEXITY: Low   GOALS: Goals reviewed with patient? Yes  SHORT TERM GOALS: Target date: 03/12/22  Patient will be independent with  initial HEP. Baseline:  Goal status: MET  02/05/22  2.  Patient will demonstrate decreased fall risk by scoring < 25 sec on TUG. Baseline:  Goal status: MET  01/31/22  3.  BERG/TUG/DGI assessed  Baseline:  Goal status: MET  01/31/22  LONG TERM GOALS: Target date: 04/09/2022  Patient will be independent with advanced/ongoing HEP to improve outcomes and carryover.  Baseline:  Goal status: MET 02/12/22  2.  Patient will be able to ambulate 600' with LRAD with good safety to access community.  Baseline:  Goal status: MET 02/12/22  3.  Patient will be able to step up/down curb safely with LRAD for safety with community ambulation.  Baseline:  Goal status: MET 02/12/22  4.  Patient will demonstrate improved functional LE strength as demonstrated by improved 5xSTS to </= 12.6 sec. Baseline: 22.73 Goal status: IN PROGRESS 02/12/22 - 17.25 sec  5.  Patient will demonstrate at least 28/30 on FGA to improve gait stability and reduce risk for falls. Baseline: 25/30 (01/31/22),02/12/22 26/30 Goal status: MET 03/20/22 - 29/30   6.  Patient will score >/= 55/56 on Berg Balance test to demonstrate lower risk of falls. .  Baseline: 52/56 Goal status: MET 03/27/22 - 55/56   7.  Pt will report decreased knee pain by >/= 50% with gait and ADLS to improve QOL. Baseline:  Goal status: IN PROGRESS 02/12/22 - Pt reports knee pain has decreased by 5%.  8.  Patient will report improved overall endurance with ADLs by 50%. Baseline:  Goal status: IN PROGRESS 02/12/22 - Pt reports 10% improvement in endurance.    PLAN:  PT FREQUENCY: 2x/week  PT DURATION: 8 weeks  PLANNED INTERVENTIONS: Therapeutic exercises, Therapeutic activity, Neuromuscular re-education, Balance training, Gait training, Patient/Family education, Self Care, Joint mobilization, Stair training, Aquatic Therapy, Dry Needling, Electrical stimulation, Cryotherapy, Taping, Ionotophoresis '4mg'$ /ml Dexamethasone, and Manual therapy  PLAN  FOR NEXT SESSION: TKE for quad activation, Assess 5xSTS, ask about decreased knee pain with gait/ADLs and overall endurance with ADLs, Re-assess MMT/ROM due to end of POC. Update and transition HEP to maintenance, anticipate transition to HEP + 30-day hold  Camilah Spillman Romero-Perozo, Student-PT 04/03/2022, 11:04 AM

## 2022-04-09 ENCOUNTER — Ambulatory Visit: Payer: Medicare Other | Admitting: Physical Therapy

## 2022-04-09 ENCOUNTER — Encounter: Payer: Self-pay | Admitting: Physical Therapy

## 2022-04-09 DIAGNOSIS — G8929 Other chronic pain: Secondary | ICD-10-CM | POA: Diagnosis not present

## 2022-04-09 DIAGNOSIS — M25562 Pain in left knee: Secondary | ICD-10-CM | POA: Diagnosis not present

## 2022-04-09 DIAGNOSIS — M6281 Muscle weakness (generalized): Secondary | ICD-10-CM | POA: Diagnosis not present

## 2022-04-09 DIAGNOSIS — R2681 Unsteadiness on feet: Secondary | ICD-10-CM

## 2022-04-09 NOTE — Therapy (Addendum)
OUTPATIENT PHYSICAL THERAPY TREATMENT / DISCHARGE SUMMARY  Progress Note  Reporting Period 02/12/2022 to 04/09/2022  See note below for Objective Data and Assessment of Progress/Goals.      Patient Name: Philip [name] MRN: [id number] DOB:31-Jan-1946, 77 y.o., male Today's Date: 04/09/2022     END OF SESSION:  PT End of Session - 04/09/22 1012     Visit Number 10    Date for PT Re-Evaluation 04/09/22    Authorization Type MCR    Progress Note Due on Visit 15    PT Start Time 1012    PT Stop Time 1100    PT Time Calculation (min) 48 min    Activity Tolerance Patient tolerated treatment well    Behavior During Therapy [facility] for tasks assessed/performed                   Past Medical History:  Diagnosis Date   Atherosclerosis of aorta ([place]) 03/11/2017   The 10-year ASCVD risk score Mikey Bussing DC Jr., et al., 2013) is: 24.6%   Values used to calculate the score:     Age: 69 years     Sex: Male     Is Non-Hispanic African American: No     Diabetic: No     Tobacco smoker: No     Systolic Blood Pressure: 123456 mmHg     Is BP treated: No     HDL Cholesterol: 71.1 mg/dL     Total Cholesterol: 212 mg/dL   Cancer (HCC)    PROSTATE   Chronic idiopathic constipation 03/11/2017   Edema leg Sept 28, 2016   left leg from foot to thigh, increasinly worse over last 4 weeks   Essential hypertension 03/30/2018   Gastroesophageal reflux disease without esophagitis 03/11/2017   Hyperlipidemia LDL goal <100 03/16/2017   The 10-year ASCVD risk score Mikey Bussing DC Jr., et al., 2013) is: 30.6%   Values used to calculate the score:     Age: 67 years     Sex: Male     Is Non-Hispanic African American: No     Diabetic: No     Tobacco smoker: No     Systolic Blood Pressure: 123456 mmHg     Is BP treated: Yes     HDL Cholesterol: 69.3 mg/dL     Total Cholesterol: 211 mg/dL   Hypertension    Hypoglycemia    Swelling LAST 30 DAYS DR [facility] AWARE   LEFT LEG AND FOOT   Past Surgical History:  Procedure Laterality Date    BOWEL DECOMPRESSION N/A 03/07/2021   Procedure: BOWEL DECOMPRESSION;  Surgeon: Yetta Flock, MD;  Location: [facility];  Service: Gastroenterology;  Laterality: N/A;   BOWEL DECOMPRESSION N/A 06/07/2021   Procedure: BOWEL DECOMPRESSION;  Surgeon: Carol Ada, MD;  Location: [facility];  Service: Gastroenterology;  Laterality: N/A;   FLEXIBLE SIGMOIDOSCOPY N/A 03/07/2021   Procedure: FLEXIBLE SIGMOIDOSCOPY;  Surgeon: Yetta Flock, MD;  Location: [facility];  Service: Gastroenterology;  Laterality: N/A;   FLEXIBLE SIGMOIDOSCOPY N/A 06/07/2021   Procedure: FLEXIBLE SIGMOIDOSCOPY;  Surgeon: Carol Ada, MD;  Location: [facility];  Service: Gastroenterology;  Laterality: N/A;   GUM SURGERY     TEETH IMPLANTS ALSO   LAPAROSCOPIC SIGMOID COLECTOMY N/A 06/10/2021   Procedure: LAPAROSCOPIC ASSISTED SIGMOID COLECTOMY;  Surgeon: Johnathan Hausen, MD;  Location: [facility];  Service: General;  Laterality: N/A;   ORCHIECTOMY Bilateral 01/03/2015   Procedure: ORCHIECTOMY;  Surgeon: Alexis Frock, MD;  Location: [facility];  Service: Urology;  Laterality: Bilateral;   Patient Active Problem List   Diagnosis Date Noted   Right foot injury, initial encounter 01/14/2022   Sigmoid volvulus ([place]) 06/07/2021   Generalized abdominal tenderness without rebound tenderness 03/07/2021   Iron deficiency anemia 03/07/2021   Diuretic-induced hypokalemia 12/27/2019   Tinnitus of both ears 09/07/2019   Laryngopharyngeal reflux (LPR) 09/07/2019   Essential hypertension 03/30/2018   Port-A-Cath in place 08/25/2017   Hyperlipidemia LDL goal <100 03/16/2017   Atherosclerosis of aorta ([place]) 03/11/2017   Chronic idiopathic constipation 03/11/2017   Seasonal allergic rhinitis due to pollen 03/11/2017   Gastroesophageal reflux disease without esophagitis 03/11/2017   Malignant neoplasm of bone ([place]) 10/16/2015   Prostate cancer ([place]) 02/02/2015    PCP: [name], MD  REFERRING PROVIDER: [name], MD  REFERRING DIAG:  R26.81 (ICD-10-CM) - Gait instability C61 (ICD-10-CM) - Prostate cancer ([place])  THERAPY DIAG:  Unsteadiness on feet  Muscle weakness (generalized)  Chronic pain of left knee  RATIONALE FOR EVALUATION AND TREATMENT: Rehabilitation  ONSET DATE: 2016  NEXT MD APPOINTMENT:  none scheduled   SUBJECTIVE:   SUBJECTIVE STATEMENT: Pt has been good and feeling better now that the rain has been gone for a while now. Did good with the addition to his HEP from last time.   % decrease in pain with gait & ADLs= 60% Improved % of overall endurance with ADLs= 60%  PERTINENT HISTORY:  Advanced prostate CA with disease to bone dx 2016, anemia, anxiety  PAIN:  Are you having pain? No  PRECAUTIONS: Other: Active CA  WEIGHT BEARING RESTRICTIONS: No  FALLS:  Has patient fallen in last 6 months? 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     OBJECTIVE:   DIAGNOSTIC FINDINGS:  Left Knee xray 01/18/21 - No fracture or dislocation of the left knee. Mild medial compartment and patellofemoral arthrosis, with preserved lateral compartment. Probable loose bodies in the posterior joint space. Moderate knee joint effusion. Soft unremarkable.   IMPRESSION: No fracture or dislocation of the left knee. Mild medial compartment and patellofemoral arthrosis, with preserved lateral compartment. Probable loose bodies in the posterior joint space. Moderate knee joint effusion.   PATIENT SURVEYS:  ABC scale 99.38%  COGNITION: Overall cognitive status: Within functional limits for tasks assessed     SENSATION: WFL  EDEMA:  Circumferential: R 41.5 cm  L 44  cm  MUSCLE LENGTH: HS: marked bil tightness Quads: ITB: mod tightness L Piriformis: WNL  Hip Flexors: Heelcords: marked bil  POSTURE: decreased lumbar lordosis and weight shift left  PALPATION: Palpation: TTP at Lt glute min/med, bil lumbar; mild left ITB. Spinal Mobility: Patellar Mobility: WNL L  LUMBAR ROM:  WFL  except bil rot R 75%, L 50% with less weight shift  LOWER EXTREMITY ROM:  WNL except bil ankle DF limited by tight heel cords.  LOWER EXTREMITY MMT: decreased core strength with MMT.  MMT Right eval Left eval Right 02/12/22 Left 02/12/22 Right 03/20/22 Left 03/20/22 Right 04/09/22 Left 04/09/22  Hip flexion 4+ 4+* 4+ 4+ 4+ 4+ 5 4+  Hip extension 4 4+ 4+ 4+ 4+ 4+ 4+ 4+  Hip abduction 5 4+ 5 5 5 5 5  5  Hip adduction     5 5 5 5   Hip internal rotation          Hip external rotation          Knee flexion 5 5* 5 5* 5 5 5 5   Knee extension 5 5 5 5 5 5 5 5   Ankle dorsiflexion 5 5 5  5* 5 5 5 5    (Blank rows = not tested)  *pain  UE MMT:  Bil shoulder flex and ABD 5/5, ext 4+/5  FUNCTIONAL TESTS:  5 times sit to stand: 22.73 with mild pain bil knees; 17.25 sec (02/12/22) Timed up and go (TUG): 11.53 sec (01/31/22) 10 meter walk test: 8.44 (01/31/22) - Gait speed = 3.89 ft/sec Berg Balance Scale: 52/56 (01/31/22); 53/56 (02/12/22 ) Functional gait assessment: 25/30 (01/31/22); 26/30 (02/12/22); 29/30 (03/20/22)   GAIT: Distance walked: 40 Assistive device utilized: None Level of assistance: Complete Independence Comments: antalgic   TODAY'S TREATMENT:                                                                                                                              DATE:    04/09/22: THERAPEUTIC EXERCISE: to improve flexibility, strength and mobility.  Verbal and tactile cues throughout for technique. UBE in standing - L3.0 x 6 min (3' each fwd & back) TKE w/ GTB 2x10 each  Seated Knee Flx/Ext with BTB 2x10   THERAPEUTIC ACTIVITIES: MMT 5XSTS: 13  seconds    04/03/22:  THERAPEUTIC EXERCISE: to improve flexibility, strength and mobility.  Verbal and tactile cues throughout for technique. UBE in standing - L3.0 x 6 min (3' each fwd & back) Seated Knee Flx/Ext with BTB 2x10  Standing Shoulder ER with GTB x10 Standing Shoulder Horizontal Abduction  Lunges at counter x10 each, verbal and tactile cues given to maintain proper form Standing Marches w/ 5lb OH on one arm x10 each  NEUROMUSCULAR RE-EDUCATION: To improve balance, proprioception, and reduce fall risk. Standing on Airex pad: EC with narrow BOS 2x 30" EO with narrow BOS 1x 30"- EO with narrow BOS + horizontal/vertical head turns x 30" EO with narrow BOS+ trunk rotation w/ arms crossed over chest x10  Tandem x30" each  3 [facility] Knock down & Pick Up SLS; x2 on R leg, x1 on L leg due to continued popping and giving away   03/27/22 THERAPEUTIC EXERCISE: to improve flexibility, strength and mobility.  Verbal and tactile cues throughout for technique. UBE in standing - L3.0 x 6 min (3' each fwd & back) Seated Knee Extension/Flexion #20 for flexion, #5 for extension 2x10 each  Leg Press Machine #25 x5, did not like the way it made him sit so decided to stop  THERAPEUTIC ACTIVITIES: Berg Balance Scale: 55/56 5xSTS: defer for next time due to L knee hurting   NEUROMUSCULAR RE-EDUCATION: To improve balance, proprioception, and reduce fall risk. [facility] Taps 3-way: bilat down and back(standing on L knee hurt so  deferred to just standing on R)  3 [facility] Knock down & Pick Up SLS; x2 on R leg, x1 on L leg but made knee hurt too much  Standing on Airex pad: EC with normal BOS 1x 30" EO with narrow BOS 1x 30" EC with narrow BOS 1x 30"- PT guarding  EO with narrow BOS + horizontal/vertical head turns x 30" EO with narrow BOS+ trunk rotation w/ arms crossed over chest x30"   PATIENT EDUCATION:  Education details: progress with PT, HEP review, and HEP consolidation Person educated:  Patient Education method: Explanation, Demonstration, Tactile cues, Verbal cues, and Handouts Education comprehension: verbalized understanding and returned demonstration   HOME EXERCISE PROGRAM: Access Code: [email or web address] URL: [email or web address] Date: 04/09/2022 Prepared by: Verdene Lennert Romero-Perozo  Exercises - Sit to Stand  - 3 x daily - 3-4 x weekly - 1-2 sets - 5 reps - Gastroc Stretch on Wall  - 2 x daily - 4 x weekly - 1 sets - 3 reps - 30-60 sec hold - Soleus Stretch on Wall  - 2 x daily - 4 x weekly - 1 sets - 3 reps - 30-60 sec hold - Supine Bridge  - 2 x daily - 3-4 x weekly - 1-3 sets - 10 reps - Standing Shoulder Diagonal Horizontal Abduction 60/120 Degrees with Resistance  - 1 x daily - 3-4 x weekly - 2 sets - 10 reps - 3 sec hold - Standing Bilateral Low Shoulder Row with Anchored Resistance  - 1 x daily - 3-4 x weekly - 2 sets - 10 reps - 5 sec hold - Scapular Retraction with Resistance Advanced  - 1 x daily - 3-4 x weekly - 2 sets - 10 reps - 5 sec hold - Standing Hip Flexion with Anchored Resistance and Chair Support  - 1 x daily - 3-4 x weekly - 2 sets - 10 reps - 3 sec hold - Standing Hip Adduction with Anchored Resistance  - 1 x daily - 3-4 x weekly - 2 sets - 10 reps - 3 sec hold - Standing Hip Extension with Anchored Resistance  - 1 x daily - 3-4 x weekly - 2 sets - 10 reps - 3 sec hold - Standing Hip Abduction with Anchored Resistance  - 1 x daily - 3-4 x weekly - 2 sets - 10 reps - 3 sec hold - Wall Push Up  - 1 x daily - 3-4 x weekly - 3 sets - 10 reps - Side Stepping with Resistance at Ankles  - 1 x daily - 3-4 x weekly - 3 sets - 10 reps - Marching with Resistance  - 1 x daily - 3-4 x weekly - 2 sets - 10 reps - 3 sec hold - Seated Hamstring Curl with Anchored Resistance  - 1 x daily - 3-4 x weekly - 3 sets - 10 reps - Sitting Knee Extension with Resistance  - 1 x daily - 3-4 x weekly - 3 sets - 10 reps - Standing Shoulder Horizontal Abduction with  Resistance  - 1 x daily - 3-4 x weekly - 3 sets - 10 reps - Shoulder External Rotation and Scapular Retraction with Resistance  - 1 x daily - 3-4 x weekly - 3 sets - 10 reps - Standing Terminal Knee Extension with Resistance  - 1 x daily - 3-4 x weekly - 2 sets - 10 reps   ASSESSMENT:  CLINICAL IMPRESSION:  Pt came into today's session with no pain or discomfort. Pt stated  that he has felt that since starting therapy his pain has decreased by 60% and his endurance while walking and doing his ADLs has increased by 60% as well. Pt states he feels like he is doing a lot better now. Pt re-assessment of goals was conducted since today was the anticipated D/C date. Pt met all of his LTG and pt noted all of the progress that he has made throughout his time in PT. Pt was given a review of his final HEP to make sure he didn't have any questions and was given one additional exercise to continue to work on his quadricept strength. Pt demonstrated complete independence with his HEP and was reminded on the frequency and repetitions for each exercise. Pt was given the option of the 30-day hold and he took the offer since he is changing oncology doctors and does not know if his new MD would recommend PT. Pt was advised that if should any questions arise, to not hesitate and call us.   OBJECTIVE IMPAIRMENTS: Abnormal gait, decreased balance, decreased ROM, decreased strength, increased edema, increased muscle spasms, impaired flexibility, and pain.   ACTIVITY LIMITATIONS: locomotion level  PARTICIPATION LIMITATIONS: yard work  PERSONAL FACTORS: Age and 3+ comorbidities: Advanced prostate CA with disease to bone dx 2016, anemia, anxiety  are also affecting patient's functional outcome.   REHAB POTENTIAL: Good  CLINICAL DECISION MAKING: Evolving/moderate complexity  EVALUATION COMPLEXITY: Low   GOALS: Goals reviewed with patient? Yes  SHORT TERM GOALS: Target date: 03/12/22  Patient will be independent  with initial HEP. Baseline:  Goal status: MET  02/05/22  2.  Patient will demonstrate decreased fall risk by scoring < 25 sec on TUG. Baseline:  Goal status: MET  01/31/22  3.  BERG/TUG/DGI assessed  Baseline:  Goal status: MET  01/31/22  LONG TERM GOALS: Target date: 04/09/2022  Patient will be independent with advanced/ongoing HEP to improve outcomes and carryover.  Baseline:  Goal status: MET 02/12/22  2.  Patient will be able to ambulate 600' with LRAD with good safety to access community.  Baseline:  Goal status: MET 02/12/22  3.  Patient will be able to step up/down curb safely with LRAD for safety with community ambulation.  Baseline:  Goal status: MET 02/12/22  4.  Patient will demonstrate improved functional LE strength as demonstrated by improved 5xSTS to </= 12.6 sec. Baseline: 22.73 Goal status: MET 04/09/22-13 seconds   5.  Patient will demonstrate at least 28/30 on FGA to improve gait stability and reduce risk for falls. Baseline: 25/30 (01/31/22),02/12/22 26/30 Goal status: MET 03/20/22 - 29/30   6.  Patient will score >/= 55/56 on Berg Balance test to demonstrate lower risk of falls. .  Baseline: 52/56 Goal status: MET 03/27/22 - 55/56   7.  Pt will report decreased knee pain by >/= 50% with gait and ADLS to improve QOL. Baseline:  Goal status: MET 04/09/22 60% improvement with pain.  8.  Patient will report improved overall endurance with ADLs by 50%. Baseline:  Goal status: MET 04/09/22 - Pt reports 60% improvement in endurance.    PLAN:  PT FREQUENCY: 2x/week  PT DURATION: 8 weeks  PLANNED INTERVENTIONS: Therapeutic exercises, Therapeutic activity, Neuromuscular re-education, Balance training, Gait training, Patient/Family education, Self Care, Joint mobilization, Stair training, Aquatic Therapy, Dry Needling, Electrical stimulation, Cryotherapy, Taping, Ionotophoresis 4mg /ml Dexamethasone, and Manual therapy  PLAN FOR NEXT SESSION: Jamard has transitioned  to his HEP but will remain on hold for PT for 30 days  in the event that issues arise that may necessitate a return to PT.   Annalicia Renfrew Romero-Perozo, Student-PT 04/09/2022, 11:12 AM   PHYSICAL THERAPY DISCHARGE SUMMARY  Visits from Start of Care: 10  Current functional level related to goals / functional outcomes:   Refer to above clinical impression and goal assessment for status as of last visit on 04/09/2022. Patient was placed on hold for 30 days and has not needed to return to PT, therefore will proceed with discharge from PT for this episode.     Remaining deficits:   As above.   Education / Equipment:   HEP   Patient agrees to discharge. Patient goals were met. Patient is being discharged due to meeting the stated rehab goals.  Percival Spanish, PT, MPT 06/04/22, 10:44 AM  [facility] [address]  [place], [place], [address] Phone: [phone]   Fax:  [phone]

## 2022-04-10 ENCOUNTER — Encounter: Payer: Medicare Other | Admitting: Physical Therapy

## 2022-04-15 ENCOUNTER — Telehealth: Payer: Self-pay

## 2022-04-15 NOTE — Telephone Encounter (Signed)
Oral Oncology Pharmacist Encounter   Medication Samples have been provided to the patient on 03/19/2022.   Drug name: enzalutamide Gillermina Phy)        Strength: 51m         Qty: 56              LOT:[id number]             Exp.Date: 06/02/2023   Dosing instructions: Take 4 tablets (1611m by mouth once daily.    The patient has been instructed regarding the correct time, dose, and frequency of taking this medication, including desired effects and most common side effects.    KaDrema HalonPharmD Hematology/Oncology Clinical Pharmacist [phone]

## 2022-04-17 ENCOUNTER — Encounter: Payer: Medicare Other | Admitting: Physical Therapy

## 2022-04-28 ENCOUNTER — Other Ambulatory Visit: Payer: Self-pay | Admitting: Internal Medicine

## 2022-04-28 DIAGNOSIS — E876 Hypokalemia: Secondary | ICD-10-CM

## 2022-04-28 DIAGNOSIS — I10 Essential (primary) hypertension: Secondary | ICD-10-CM

## 2022-05-03 ENCOUNTER — Other Ambulatory Visit: Payer: Self-pay | Admitting: Internal Medicine

## 2022-05-03 DIAGNOSIS — I10 Essential (primary) hypertension: Secondary | ICD-10-CM

## 2022-05-07 ENCOUNTER — Ambulatory Visit: Payer: Medicare Other | Admitting: Psychologist

## 2022-05-12 ENCOUNTER — Other Ambulatory Visit: Payer: Self-pay

## 2022-05-12 DIAGNOSIS — C61 Malignant neoplasm of prostate: Secondary | ICD-10-CM

## 2022-05-13 ENCOUNTER — Inpatient Hospital Stay: Payer: Medicare Other | Attending: Oncology

## 2022-05-13 ENCOUNTER — Other Ambulatory Visit: Payer: Self-pay

## 2022-05-13 DIAGNOSIS — C7951 Secondary malignant neoplasm of bone: Secondary | ICD-10-CM | POA: Diagnosis not present

## 2022-05-13 DIAGNOSIS — C61 Malignant neoplasm of prostate: Secondary | ICD-10-CM | POA: Diagnosis not present

## 2022-05-13 DIAGNOSIS — C779 Secondary and unspecified malignant neoplasm of lymph node, unspecified: Secondary | ICD-10-CM | POA: Insufficient documentation

## 2022-05-13 DIAGNOSIS — Z95828 Presence of other vascular implants and grafts: Secondary | ICD-10-CM

## 2022-05-13 LAB — CBC WITH DIFFERENTIAL (CANCER CENTER ONLY)
Abs Immature Granulocytes: 0.01 10*3/uL (ref 0.00–0.07)
Basophils Absolute: 0 10*3/uL (ref 0.0–0.1)
Basophils Relative: 0 %
Eosinophils Absolute: 0.1 10*3/uL (ref 0.0–0.5)
Eosinophils Relative: 1 %
HCT: 35.4 % — ABNORMAL LOW (ref 39.0–52.0)
Hemoglobin: 12.1 g/dL — ABNORMAL LOW (ref 13.0–17.0)
Immature Granulocytes: 0 %
Lymphocytes Relative: 24 %
Lymphs Abs: 1.3 10*3/uL (ref 0.7–4.0)
MCH: 31.9 pg (ref 26.0–34.0)
MCHC: 34.2 g/dL (ref 30.0–36.0)
MCV: 93.4 fL (ref 80.0–100.0)
Monocytes Absolute: 0.7 10*3/uL (ref 0.1–1.0)
Monocytes Relative: 14 %
Neutro Abs: 3.3 10*3/uL (ref 1.7–7.7)
Neutrophils Relative %: 61 %
Platelet Count: 258 10*3/uL (ref 150–400)
RBC: 3.79 MIL/uL — ABNORMAL LOW (ref 4.22–5.81)
RDW: 12.8 % (ref 11.5–15.5)
WBC Count: 5.3 10*3/uL (ref 4.0–10.5)
nRBC: 0 % (ref 0.0–0.2)

## 2022-05-13 LAB — CMP (CANCER CENTER ONLY)
ALT: 12 U/L (ref 0–44)
AST: 16 U/L (ref 15–41)
Albumin: 4 g/dL (ref 3.5–5.0)
Alkaline Phosphatase: 60 U/L (ref 38–126)
Anion gap: 7 (ref 5–15)
BUN: 31 mg/dL — ABNORMAL HIGH (ref 8–23)
CO2: 26 mmol/L (ref 22–32)
Calcium: 9.5 mg/dL (ref 8.9–10.3)
Chloride: 108 mmol/L (ref 98–111)
Creatinine: 1.15 mg/dL (ref 0.61–1.24)
GFR, Estimated: >60 mL/min (ref >60)
Glucose, Bld: 102 mg/dL — ABNORMAL HIGH (ref 70–99)
Potassium: 4.4 mmol/L (ref 3.5–5.1)
Sodium: 141 mmol/L (ref 135–145)
Total Bilirubin: 0.6 mg/dL (ref 0.3–1.2)
Total Protein: 6.7 g/dL (ref 6.5–8.1)

## 2022-05-13 MED ORDER — SODIUM CHLORIDE 0.9% FLUSH
10.0000 mL | Freq: Once | INTRAVENOUS | Status: AC
  Administered 2022-05-13: 10 mL

## 2022-05-13 MED ORDER — HEPARIN SOD (PORK) LOCK FLUSH 100 UNIT/ML IV SOLN
500.0000 [IU] | Freq: Once | INTRAVENOUS | Status: AC
  Administered 2022-05-13: 500 [IU]

## 2022-05-15 ENCOUNTER — Other Ambulatory Visit: Payer: Self-pay | Admitting: Internal Medicine

## 2022-05-15 DIAGNOSIS — D509 Iron deficiency anemia, unspecified: Secondary | ICD-10-CM

## 2022-05-15 LAB — PROSTATE-SPECIFIC AG, SERUM (LABCORP): Prostate Specific Ag, Serum: <0.1 ng/mL (ref 0.0–4.0)

## 2022-05-19 NOTE — Progress Notes (Unsigned)
[place] [phone]   [phone]  PROGRESS NOTE  Patient Care Team: [name], MD as PCP - General (Internal Medicine) Amedeo Kinsman, OD as Consulting Physician (Optometry) Alexis Frock, MD as Consulting Physician (Urology) [name], MD as Consulting Physician (Oncology)  Hematological/Oncological History # Castrate Resistant Advanced Prostate Cancer # Metastatic Prostate Cancer to Lymph Nodes/Bone  01/2015: orchiectomy performed 02/16/2015: Taxotere chemotherapy at 75 mg/m. Continued until 05/2015 09/2015: start of zytiga. Stopped in 2023 due to insurance issues 04/2021: Xtandi 160 mg daily  03/19/2022: last visit with Dr. Alen Blew 05/20/2022: transition care to Dr. Lorenso Courier   Interval History:  Philip [name] 77 y.o. 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 The patient's last visit was on 03/19/2022 with Dr. Alen Blew. In the interim since the last visit he has continued on Xtandi therapy.   On exam today Philip [name] reports that he is tolerating his antitherapy quite well.  Has been on this for little over 1 year.  He notes that he thinks the Zytiga was not as good and caused more side effects.  He thinks it was the prednisone with the Zytiga that caused his anxiety to increase.  He notes he has been seeing a therapist which has been helping some with his anxiety.  He notes overall he feels quite good.  He notes that he has been holding off on his Delton See due to healing up from a dental surgery, but now is ready to restart it.  He reports that he is still not quite at his full strength.  He notes his appetite is been good and his energy has been solid.  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  He notes that his sleep can be off/on.  He reports otherwise no fevers, chills, sweats, nausea vomiting or  diarrhea.  A full 10 point ROS was otherwise negative.  MEDICAL HISTORY:  Past Medical History:  Diagnosis Date   Atherosclerosis of aorta ([place]) 03/11/2017   The 10-year ASCVD risk score Mikey Bussing DC Jr., et al., 2013) is: 24.6%   Values used to calculate the score:     Age: 49 years     Sex: Male     Is Non-Hispanic African American: No     Diabetic: No     Tobacco smoker: No     Systolic Blood Pressure: 123456 mmHg     Is BP treated: No     HDL Cholesterol: 71.1 mg/dL     Total Cholesterol: 212 mg/dL   Cancer (HCC)    PROSTATE   Chronic idiopathic constipation 03/11/2017   Edema leg Sept 28, 2016   left leg from foot to thigh, increasinly worse over last 4 weeks   Essential hypertension 03/30/2018   Gastroesophageal reflux disease without esophagitis 03/11/2017   Hyperlipidemia LDL goal <100 03/16/2017   The 10-year ASCVD risk score Mikey Bussing DC Jr., et al., 2013) is: 30.6%   Values used to calculate the score:     Age: 5 years     Sex: Male     Is Non-Hispanic African American: No     Diabetic: No     Tobacco smoker: No     Systolic Blood Pressure: 123456 mmHg     Is BP treated: Yes     HDL Cholesterol: 69.3 mg/dL     Total Cholesterol:  211 mg/dL   Hypertension    Hypoglycemia    Swelling LAST 30 DAYS DR [facility] AWARE   LEFT LEG AND FOOT    SURGICAL HISTORY: Past Surgical History:  Procedure Laterality Date   BOWEL DECOMPRESSION N/A 03/07/2021   Procedure: BOWEL DECOMPRESSION;  Surgeon: Yetta Flock, MD;  Location: [facility];  Service: Gastroenterology;  Laterality: N/A;   BOWEL DECOMPRESSION N/A 06/07/2021   Procedure: BOWEL DECOMPRESSION;  Surgeon: Carol Ada, MD;  Location: [facility];  Service: Gastroenterology;  Laterality: N/A;   FLEXIBLE SIGMOIDOSCOPY N/A 03/07/2021   Procedure: FLEXIBLE SIGMOIDOSCOPY;  Surgeon: Yetta Flock, MD;  Location: [facility];  Service: Gastroenterology;  Laterality: N/A;   FLEXIBLE SIGMOIDOSCOPY N/A 06/07/2021   Procedure: FLEXIBLE SIGMOIDOSCOPY;   Surgeon: Carol Ada, MD;  Location: [facility];  Service: Gastroenterology;  Laterality: N/A;   GUM SURGERY     TEETH IMPLANTS ALSO   LAPAROSCOPIC SIGMOID COLECTOMY N/A 06/10/2021   Procedure: LAPAROSCOPIC ASSISTED SIGMOID COLECTOMY;  Surgeon: Johnathan Hausen, MD;  Location: [facility];  Service: General;  Laterality: N/A;   ORCHIECTOMY Bilateral 01/03/2015   Procedure: ORCHIECTOMY;  Surgeon: Alexis Frock, MD;  Location: [facility];  Service: Urology;  Laterality: Bilateral;    SOCIAL HISTORY: Social History   Socioeconomic History   Marital status: Married    Spouse name: Not on file   Number of children: Not on file   Years of education: Not on file   Highest education level: Not on file  Occupational History   Not on file  Tobacco Use   Smoking status: Never   Smokeless tobacco: Never  Vaping Use   Vaping Use: Never used  Substance and Sexual Activity   Alcohol use: Yes    Alcohol/week: 1.0 standard drink of alcohol    Types: 1 Glasses of wine per week    Comment: occasional wine with dinner   Drug use: No   Sexual activity: Not on file  Other Topics Concern   Not on file  Social History Narrative   Not on file   Social Determinants of Health   Financial Resource Strain: Low Risk  (12/04/2021)   Overall Financial Resource Strain (CARDIA)    Difficulty of Paying Living Expenses: Not hard at all  Food Insecurity: No Food Insecurity (12/04/2021)   Hunger Vital Sign    Worried About Running Out of Food in the Last Year: Never true    Ran Out of Food in the Last Year: Never true  Transportation Needs: No Transportation Needs (12/04/2021)   PRAPARE - Hydrologist (Medical): No    Lack of Transportation (Non-Medical): No  Physical Activity: Sufficiently Active (12/04/2021)   Exercise Vital Sign    Days of Exercise per Week: 5 days    Minutes of Exercise per Session: 30 min  Stress: No Stress Concern Present (12/04/2021)   [place]    Feeling of Stress : Not at all  Social Connections: Moderately Isolated (12/04/2021)   Social Connection and Isolation Panel [NHANES]    Frequency of Communication with Friends and Family: More than three times a week    Frequency of Social Gatherings with Friends and Family: More than three times a week    Attends Religious Services: Never    Marine scientist or Organizations: Patient declined    Attends Archivist Meetings: Never    Marital Status: Married  Human resources officer Violence: Not  At Risk (12/04/2021)   Humiliation, Afraid, Rape, and Kick questionnaire    Fear of Current or Ex-Partner: No    Emotionally Abused: No    Physically Abused: No    Sexually Abused: No    FAMILY HISTORY: Family History  Problem Relation Age of Onset   CVA Mother    Lymphoma Mother    Pneumonia Mother    CAD Father    Osteoarthritis Brother    Colon cancer Neg Hx     ALLERGIES:  is allergic to prednisone.  MEDICATIONS:  Current Outpatient Medications  Medication Sig Dispense Refill   aspirin EC 81 MG tablet Take 81 mg by mouth daily. Swallow whole.     atorvastatin (LIPITOR) 10 MG tablet TAKE 1 TABLET BY MOUTH EVERY DAY 90 tablet 1   BIOTIN 5000 PO Take 1 tablet by mouth daily.     CALCIUM-VITAMIN D PO Take 1 tablet by mouth daily.     Denosumab (XGEVA Baxter) Inject into the skin. Every 6-8 weeks.     enzalutamide (XTANDI) 40 MG tablet Take 4 tablets (160mg ) by mouth once daily as directed by physician. 120 tablet 12   ferrous sulfate 325 (65 FE) MG EC tablet TAKE 1 TABLET BY MOUTH EVERY DAY WITH BREAKFAST 90 tablet 1   fexofenadine (ALLEGRA) 180 MG tablet Take 180 mg by mouth daily.     hydrOXYzine (ATARAX/VISTARIL) 10 MG tablet Take 10 mg by mouth daily.     KLOR-CON M20 20 MEQ tablet TAKE 1 TABLET BY MOUTH TWICE A DAY 180 tablet 0   lidocaine-prilocaine (EMLA) cream Apply 1 Application topically as needed (access  port). 30 g 0   LORazepam (ATIVAN) 1 MG tablet TAKE 1 TABLET BY MOUTH EVERY 8 HOURS AS NEEDED FOR ANXIETY 90 tablet 0   Multiple Vitamin (MULTIVITAMIN) tablet Take 1 tablet by mouth daily.     olmesartan (BENICAR) 40 MG tablet TAKE 1 TABLET BY MOUTH EVERY DAY 90 tablet 0   omeprazole (PRILOSEC) 20 MG capsule TAKE 1 CAPSULE BY MOUTH DAILY AS NEEDED. 90 capsule 1   No current facility-administered medications for this visit.    REVIEW OF SYSTEMS:   Constitutional: ( - ) fevers, ( - )  chills , ( - ) night sweats Eyes: ( - ) blurriness of vision, ( - ) double vision, ( - ) watery eyes Ears, nose, mouth, throat, and face: ( - ) mucositis, ( - ) sore throat Respiratory: ( - ) cough, ( - ) dyspnea, ( - ) wheezes Cardiovascular: ( - ) palpitation, ( - ) chest discomfort, ( - ) lower extremity swelling Gastrointestinal:  ( - ) nausea, ( - ) heartburn, ( - ) change in bowel habits Skin: ( - ) abnormal skin rashes Lymphatics: ( - ) new lymphadenopathy, ( - ) easy bruising Neurological: ( - ) numbness, ( - ) tingling, ( - ) new weaknesses Behavioral/Psych: ( - ) mood change, ( - ) new changes  All other systems were reviewed with the patient and are negative.  PHYSICAL EXAMINATION: ECOG PERFORMANCE STATUS: 0 - Asymptomatic  Vitals:   05/20/22 1009  BP: (!) 140/94  Pulse: 75  Resp: 14  Temp: (!) 97.4 F (36.3 C)  SpO2: 100%   Filed Weights   05/20/22 1009  Weight: 192 lb 6.4 oz (87.3 kg)    GENERAL: Well-appearing elderly Caucasian male, alert, no distress and comfortable SKIN: skin color, texture, turgor are normal, no rashes or significant lesions EYES:  conjunctiva are pink and non-injected, sclera clear LUNGS: clear to auscultation and percussion with normal breathing effort HEART: regular rate & rhythm and no murmurs and no lower extremity edema Musculoskeletal: no cyanosis of digits and no clubbing  PSYCH: alert & oriented x 3, fluent speech NEURO: no focal motor/sensory  deficits  LABORATORY DATA:  I have reviewed the data as listed    Latest Ref Rng & Units 05/13/2022   10:27 AM 03/17/2022   10:53 AM 01/15/2022    1:54 PM  CBC  WBC 4.0 - 10.5 K/uL 5.3  5.2  8.0   Hemoglobin 13.0 - 17.0 g/dL 12.1  12.2  11.3   Hematocrit 39.0 - 52.0 % 35.4  35.0  33.5   Platelets 150 - 400 K/uL 258  233  256        Latest Ref Rng & Units 05/13/2022   10:27 AM 03/17/2022   10:53 AM 01/15/2022    1:54 PM  CMP  Glucose 70 - 99 mg/dL 102  92  101   BUN 8 - 23 mg/dL 31  22  24    Creatinine 0.61 - 1.24 mg/dL 1.15  1.03  1.08   Sodium 135 - 145 mmol/L 141  137  138   Potassium 3.5 - 5.1 mmol/L 4.4  4.4  4.5   Chloride 98 - 111 mmol/L 108  105  106   CO2 22 - 32 mmol/L 26  26  26    Calcium 8.9 - 10.3 mg/dL 9.5  9.2  9.1   Total Protein 6.5 - 8.1 g/dL 6.7  6.5  7.0   Total Bilirubin 0.3 - 1.2 mg/dL 0.6  0.5  0.4   Alkaline Phos 38 - 126 U/L 60  48  49   AST 15 - 41 U/L 16  16  18    ALT 0 - 44 U/L 12  12  15      RADIOGRAPHIC STUDIES: No results found.  ASSESSMENT & PLAN Ediel Janus 77 y.o. 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  # Castrate Resistant Advanced Prostate Cancer # Metastatic Prostate Cancer to Lymph Nodes/Bone  --patient is s/p orchiectomy, no ADT therapy required --continue Xtandi 160 mg PO daily -- PSA <0.1 currently on target  -- last PSMA scan on 01/16/2022 showed response to therapy with no new progressive adenopathy or skeletal metastasis.  --RTC in 3 months for continued evaluation and monitoring.   No orders of the defined types were placed in this encounter.   All questions were answered. The patient knows to call the clinic with any problems, questions or concerns.  A total of more than 40 minutes were spent on this encounter with face-to-face time and non-face-to-face time, including preparing to see the  patient, ordering tests and/or medications, counseling the patient and coordination of care as outlined above.   Ledell Peoples, MD Department of Hematology/Oncology [place] [facility] Phone: [phone] Pager: [phone] Email: [email or web address]  05/20/2022 3:56 PM

## 2022-05-20 ENCOUNTER — Inpatient Hospital Stay (HOSPITAL_BASED_OUTPATIENT_CLINIC_OR_DEPARTMENT_OTHER): Payer: Medicare Other | Admitting: Hematology and Oncology

## 2022-05-20 ENCOUNTER — Inpatient Hospital Stay: Payer: Medicare Other

## 2022-05-20 VITALS — BP 140/94 | HR 75 | Temp 97.4°F | Resp 14 | Wt 192.4 lb

## 2022-05-20 DIAGNOSIS — C61 Malignant neoplasm of prostate: Secondary | ICD-10-CM

## 2022-05-20 DIAGNOSIS — Z95828 Presence of other vascular implants and grafts: Secondary | ICD-10-CM

## 2022-05-20 DIAGNOSIS — C779 Secondary and unspecified malignant neoplasm of lymph node, unspecified: Secondary | ICD-10-CM | POA: Diagnosis not present

## 2022-05-20 DIAGNOSIS — C7951 Secondary malignant neoplasm of bone: Secondary | ICD-10-CM | POA: Diagnosis not present

## 2022-05-20 MED ORDER — DENOSUMAB 120 MG/1.7ML ~~LOC~~ SOLN
120.0000 mg | Freq: Once | SUBCUTANEOUS | Status: AC
  Administered 2022-05-20: 120 mg via SUBCUTANEOUS
  Filled 2022-05-20: qty 1.7

## 2022-05-26 ENCOUNTER — Ambulatory Visit (INDEPENDENT_AMBULATORY_CARE_PROVIDER_SITE_OTHER): Payer: Medicare Other | Admitting: Psychologist

## 2022-05-26 DIAGNOSIS — F411 Generalized anxiety disorder: Secondary | ICD-10-CM | POA: Diagnosis not present

## 2022-05-26 NOTE — Progress Notes (Signed)
[facility] Counselor/Therapist Progress Note  Patient ID: Philip [name], MRN: [id number],    Date: 05/26/2022  Time Spent: 10:06 am to 10:43 am; total time: 37 minutes   This session was held via in person. The patient consented to in-person therapy and was in the clinician's office. Limits of confidentiality were discussed with the patient.   Treatment Type: Individual Therapy  Reported Symptoms: Denied symptoms  Mental Status Exam: Appearance:  Well Groomed     Behavior: Appropriate  Motor: Normal  Speech/Language:  Clear and Coherent  Affect: Appropriate  Mood: normal  Thought process: normal  Thought content:   WNL  Sensory/Perceptual disturbances:   WNL  Orientation: oriented to person, place, and time/date  Attention: Good  Concentration: Good  Memory: WNL  Fund of knowledge:  Good  Insight:   Good  Judgment:  Good  Impulse Control: Good   Risk Assessment: Danger to Self:  No Self-injurious Behavior: No Danger to Others: No Duty to Warn:no Physical Aggression / Violence:No  Access to Firearms a concern: No  Gang Involvement:No   Subjective: Beginning the session, patient described himself as doing well while reflecting on past experiencing and disclosing about upcoming events. He denied any concerns. He processed his thoughts and emotions. He talked about what he learned about himself and ow the therapeutic relationship assisted him. He terminated therapy. He denied suicidal and homicidal ideation.    Interventions:  Worked on developing a therapeutic relationship with the patient using active listening and reflective statements. Provided emotional support using empathy and validation. Reviewed the treatment plan with the patient. Reviewed events since the last session Praised the patient for doing well and discussed upcoming events. Explored plans for the future. Processed patient's experience in therapy and what the patient learned about self.  Processed how the therapeutic relationship assisted in. Processed thoughts and emotions. Provided psychoeducation about options for counseling in the future if necessary. Provided empathic statements.  Assessed for suicidal and homicidal ideation.   Homework: NA  Next Session: NA. Patient terminated therapy. Patient met goals and clinician leaving, patient did not want to establish with someone else at this time.  Diagnosis: F41.1 generalized anxiety disorder.   Plan:   Goals Reduce overall frequency, intensity, and duration of anxiety Stabilize anxiety level wile increasing ability to function Enhance ability to effectively cope with full variety of stressors Learn and implement coping skills that result in a reduction of anxiety   Objectives target date for all objectives is 10/15/2022 Verbalize an understanding of the cognitive, physiological, and behavioral components of anxiety Learning and implement calming skills to reduce overall anxiety Verbalize an understanding of the role that cognitive biases play in excessive irrational worry and persistent anxiety symptoms Identify, challenge, and replace based fearful talk Learn and implement problem solving strategies Identify and engage in pleasant activities Learning and implement personal and interpersonal skills to reduce anxiety and improve interpersonal relationships Learn to accept limitations in life and commit to tolerating, rather than avoiding, unpleasant emotions while accomplishing meaningful goals Identify major life conflicts from the past and present that form the basis for present anxiety Maintain involvement in work, family, and social activities Reestablish a consistent sleep-wake cycle Cooperate with a medical evaluation  Interventions Engage the patient in behavioral activation Use instruction, modeling, and role-playing to build the client's general social, communication, and/or conflict resolution skills Use  Acceptance and Commitment Therapy to help client accept uncomfortable realities in order to accomplish value-consistent goals Reinforce the client's insight  into the role of his/her past emotional pain and present anxiety  Support the client in following through with work, family, and social activities Teach and implement sleep hygiene practices  Refer the patient to a physician for a psychotropic medication consultation Monito the clint's psychotropic medication compliance Discuss how anxiety typically involves excessive worry, various bodily expressions of tension, and avoidance of what is threatening that interact to maintain the problem  Teach the patient relaxation skills Assign the patient homework Discuss examples demonstrating that unrealistic worry overestimates the probability of threats and underestimates patient's ability  Assist the patient in analyzing his or her worries Help patient understand that avoidance is reinforcing     The patient and clinician reviewed the treatment plan on 11/19/2021. The patient approved of the treatment plan.   Conception Chancy, PsyD

## 2022-06-16 ENCOUNTER — Inpatient Hospital Stay: Payer: Medicare Other

## 2022-06-16 ENCOUNTER — Inpatient Hospital Stay: Payer: Medicare Other | Attending: Oncology

## 2022-06-16 VITALS — BP 133/86 | HR 80 | Temp 98.4°F | Resp 16

## 2022-06-16 DIAGNOSIS — C61 Malignant neoplasm of prostate: Secondary | ICD-10-CM | POA: Insufficient documentation

## 2022-06-16 DIAGNOSIS — C779 Secondary and unspecified malignant neoplasm of lymph node, unspecified: Secondary | ICD-10-CM | POA: Insufficient documentation

## 2022-06-16 DIAGNOSIS — C7951 Secondary malignant neoplasm of bone: Secondary | ICD-10-CM | POA: Insufficient documentation

## 2022-06-16 DIAGNOSIS — Z95828 Presence of other vascular implants and grafts: Secondary | ICD-10-CM

## 2022-06-16 LAB — CMP (CANCER CENTER ONLY)
ALT: 11 U/L (ref 0–44)
AST: 17 U/L (ref 15–41)
Albumin: 4.1 g/dL (ref 3.5–5.0)
Alkaline Phosphatase: 53 U/L (ref 38–126)
Anion gap: 6 (ref 5–15)
BUN: 26 mg/dL — ABNORMAL HIGH (ref 8–23)
CO2: 26 mmol/L (ref 22–32)
Calcium: 9.6 mg/dL (ref 8.9–10.3)
Chloride: 103 mmol/L (ref 98–111)
Creatinine: 1 mg/dL (ref 0.61–1.24)
GFR, Estimated: >60 mL/min (ref >60)
Glucose, Bld: 107 mg/dL — ABNORMAL HIGH (ref 70–99)
Potassium: 4.4 mmol/L (ref 3.5–5.1)
Sodium: 135 mmol/L (ref 135–145)
Total Bilirubin: 0.4 mg/dL (ref 0.3–1.2)
Total Protein: 6.9 g/dL (ref 6.5–8.1)

## 2022-06-16 LAB — CBC WITH DIFFERENTIAL (CANCER CENTER ONLY)
Abs Immature Granulocytes: 0.01 10*3/uL (ref 0.00–0.07)
Basophils Absolute: 0 10*3/uL (ref 0.0–0.1)
Basophils Relative: 0 %
Eosinophils Absolute: 0.1 10*3/uL (ref 0.0–0.5)
Eosinophils Relative: 2 %
HCT: 34.8 % — ABNORMAL LOW (ref 39.0–52.0)
Hemoglobin: 12 g/dL — ABNORMAL LOW (ref 13.0–17.0)
Immature Granulocytes: 0 %
Lymphocytes Relative: 32 %
Lymphs Abs: 1.7 10*3/uL (ref 0.7–4.0)
MCH: 32.3 pg (ref 26.0–34.0)
MCHC: 34.5 g/dL (ref 30.0–36.0)
MCV: 93.5 fL (ref 80.0–100.0)
Monocytes Absolute: 0.6 10*3/uL (ref 0.1–1.0)
Monocytes Relative: 11 %
Neutro Abs: 2.9 10*3/uL (ref 1.7–7.7)
Neutrophils Relative %: 55 %
Platelet Count: 210 10*3/uL (ref 150–400)
RBC: 3.72 MIL/uL — ABNORMAL LOW (ref 4.22–5.81)
RDW: 13.1 % (ref 11.5–15.5)
WBC Count: 5.3 10*3/uL (ref 4.0–10.5)
nRBC: 0 % (ref 0.0–0.2)

## 2022-06-16 MED ORDER — SODIUM CHLORIDE 0.9% FLUSH
10.0000 mL | Freq: Once | INTRAVENOUS | Status: AC
  Administered 2022-06-16: 10 mL

## 2022-06-16 MED ORDER — HEPARIN SOD (PORK) LOCK FLUSH 100 UNIT/ML IV SOLN
500.0000 [IU] | Freq: Once | INTRAVENOUS | Status: AC
  Administered 2022-06-16: 500 [IU]

## 2022-06-16 MED ORDER — DENOSUMAB 120 MG/1.7ML ~~LOC~~ SOLN
120.0000 mg | Freq: Once | SUBCUTANEOUS | Status: AC
  Administered 2022-06-16: 120 mg via SUBCUTANEOUS
  Filled 2022-06-16: qty 1.7

## 2022-06-17 LAB — PROSTATE-SPECIFIC AG, SERUM (LABCORP): Prostate Specific Ag, Serum: <0.1 ng/mL (ref 0.0–4.0)

## 2022-06-25 ENCOUNTER — Encounter: Payer: Self-pay | Admitting: Internal Medicine

## 2022-06-25 ENCOUNTER — Ambulatory Visit (INDEPENDENT_AMBULATORY_CARE_PROVIDER_SITE_OTHER): Payer: Medicare Other | Admitting: Internal Medicine

## 2022-06-25 VITALS — BP 134/78 | HR 74 | Temp 98.0°F | Resp 16 | Ht 76.0 in | Wt 195.0 lb

## 2022-06-25 DIAGNOSIS — R5382 Chronic fatigue, unspecified: Secondary | ICD-10-CM | POA: Diagnosis not present

## 2022-06-25 DIAGNOSIS — M25362 Other instability, left knee: Secondary | ICD-10-CM | POA: Diagnosis not present

## 2022-06-25 DIAGNOSIS — D509 Iron deficiency anemia, unspecified: Secondary | ICD-10-CM | POA: Diagnosis not present

## 2022-06-25 DIAGNOSIS — E785 Hyperlipidemia, unspecified: Secondary | ICD-10-CM | POA: Diagnosis not present

## 2022-06-25 DIAGNOSIS — I700 Atherosclerosis of aorta: Secondary | ICD-10-CM

## 2022-06-25 DIAGNOSIS — R9431 Abnormal electrocardiogram [ECG] [EKG]: Secondary | ICD-10-CM | POA: Diagnosis not present

## 2022-06-25 DIAGNOSIS — I10 Essential (primary) hypertension: Secondary | ICD-10-CM

## 2022-06-25 LAB — TSH: TSH: 1.94 u[IU]/mL (ref 0.35–5.50)

## 2022-06-25 LAB — IBC + FERRITIN
Ferritin: 179.1 ng/mL (ref 22.0–322.0)
Iron: 98 ug/dL (ref 42–165)
Saturation Ratios: 28.5 % (ref 20.0–50.0)
TIBC: 344.4 ug/dL (ref 250.0–450.0)
Transferrin: 246 mg/dL (ref 212.0–360.0)

## 2022-06-25 LAB — TROPONIN I (HIGH SENSITIVITY): High Sens Troponin I: 4 ng/L (ref 2–17)

## 2022-06-25 NOTE — Progress Notes (Signed)
Subjective:  Patient ID: Philip [name], male    DOB: [birth year]  Age: 77 y.o. MRN: [id number]  CC: Hypertension   HPI Philip [name] presents for f/up ----  He complains of worsening pain, swelling, and instability in his left knee.  He controls the pain with Tylenol.  He is active and denies chest pain, shortness of breath, or edema but complains of chronic fatigue.  Outpatient Medications Prior to Visit  Medication Sig Dispense Refill   aspirin EC 81 MG tablet Take 81 mg by mouth daily. Swallow whole.     atorvastatin (LIPITOR) 10 MG tablet TAKE 1 TABLET BY MOUTH EVERY DAY 90 tablet 1   BIOTIN 5000 PO Take 1 tablet by mouth daily.     CALCIUM-VITAMIN D PO Take 1 tablet by mouth daily.     Denosumab (XGEVA [place]) Inject into the skin. Every 6-8 weeks.     enzalutamide (XTANDI) 40 MG tablet Take 4 tablets (160mg ) by mouth once daily as directed by physician. 120 tablet 12   ferrous sulfate 325 (65 FE) MG EC tablet TAKE 1 TABLET BY MOUTH EVERY DAY WITH BREAKFAST 90 tablet 1   fexofenadine (ALLEGRA) 180 MG tablet Take 180 mg by mouth daily.     hydrOXYzine (ATARAX/VISTARIL) 10 MG tablet Take 10 mg by mouth daily.     KLOR-CON M20 20 MEQ tablet TAKE 1 TABLET BY MOUTH TWICE A DAY 180 tablet 0   lidocaine-prilocaine (EMLA) cream Apply 1 Application topically as needed (access port). 30 g 0   LORazepam (ATIVAN) 1 MG tablet TAKE 1 TABLET BY MOUTH EVERY 8 HOURS AS NEEDED FOR ANXIETY 90 tablet 0   Multiple Vitamin (MULTIVITAMIN) tablet Take 1 tablet by mouth daily.     olmesartan (BENICAR) 40 MG tablet TAKE 1 TABLET BY MOUTH EVERY DAY 90 tablet 0   omeprazole (PRILOSEC) 20 MG capsule TAKE 1 CAPSULE BY MOUTH DAILY AS NEEDED. 90 capsule 1   No facility-administered medications prior to visit.    ROS Review of Systems  Constitutional:  Positive for fatigue. Negative for appetite change, chills, diaphoresis, fever and unexpected weight change.  HENT: Negative.    Respiratory: Negative.  Negative for  cough, chest tightness, shortness of breath and wheezing.   Cardiovascular:  Negative for chest pain, palpitations and leg swelling.  Gastrointestinal:  Negative for abdominal pain, constipation, diarrhea, nausea and vomiting.  Endocrine: Negative.   Genitourinary: Negative.  Negative for difficulty urinating.  Musculoskeletal:  Positive for arthralgias and joint swelling. Negative for gait problem.  Skin: Negative.   Neurological: Negative.  Negative for dizziness, weakness and light-headedness.  Hematological:  Negative for adenopathy. Does not bruise/bleed easily.  Psychiatric/Behavioral: Negative.      Objective:  BP 134/78 (BP Location: Left Arm, Patient Position: Sitting, Cuff Size: Large)   Pulse 74   Temp 98 F (36.7 C) (Oral)   Resp 16   Ht 6\' 4"  (1.93 m)   Wt 195 lb (88.5 kg)   SpO2 99%   BMI 23.74 kg/m   BP Readings from Last 3 Encounters:  06/25/22 134/78  06/16/22 133/86  05/20/22 (!) 140/94    Wt Readings from Last 3 Encounters:  06/25/22 195 lb (88.5 kg)  05/20/22 192 lb 6.4 oz (87.3 kg)  03/19/22 193 lb (87.5 kg)    Physical Exam Vitals reviewed.  HENT:     Nose: Nose normal.     Mouth/Throat:     Mouth: Mucous membranes are moist.  Eyes:  General: No scleral icterus.    Conjunctiva/sclera: Conjunctivae normal.  Cardiovascular:     Rate and Rhythm: Normal rate and regular rhythm.     Heart sounds: Normal heart sounds, S1 normal and S2 normal. No murmur heard.    No gallop.     Comments: EKG- NSR, 67 bpm LAE Septal infarct pattern is new No LVH or acute ST/T wave changes Pulmonary:     Effort: Pulmonary effort is normal.     Breath sounds: No stridor. No wheezing, rhonchi or rales.  Abdominal:     General: Abdomen is flat.     Palpations: There is no mass.     Tenderness: There is no abdominal tenderness. There is no guarding.     Hernia: No hernia is present.  Musculoskeletal:        General: Swelling present.     Cervical back: Neck  supple.     Right knee: Normal.     Left knee: Swelling, deformity (DJD) and crepitus present. No effusion, erythema or bony tenderness. Decreased range of motion.     Right lower leg: Normal. No edema.     Left lower leg: Deformity present. No tenderness or bony tenderness. No edema.  Lymphadenopathy:     Cervical: No cervical adenopathy.  Skin:    General: Skin is warm and dry.     Findings: No rash.  Neurological:     General: No focal deficit present.     Mental Status: Mental status is at baseline.     Lab Results  Component Value Date   WBC 5.3 06/16/2022   HGB 12.0 (L) 06/16/2022   HCT 34.8 (L) 06/16/2022   PLT 210 06/16/2022   GLUCOSE 107 (H) 06/16/2022   CHOL 181 05/09/2020   TRIG 220.0 (H) 05/09/2020   HDL 72.70 05/09/2020   LDLDIRECT 75.0 05/09/2020   ALT 11 06/16/2022   AST 17 06/16/2022   NA 135 06/16/2022   K 4.4 06/16/2022   CL 103 06/16/2022   CREATININE 1.00 06/16/2022   BUN 26 (H) 06/16/2022   CO2 26 06/16/2022   TSH 1.94 06/25/2022   PSA 0.02 (L) 03/26/2021   INR 1.06 02/09/2015    NM PET (PSMA) SKULL TO MID THIGH  Result Date: 01/17/2022 CLINICAL DATA:  Prostate carcinoma .  PSA less than 0.1. EXAM: NUCLEAR MEDICINE PET SKULL BASE TO THIGH TECHNIQUE: 8.8 mCi F18 Piflufolastat (Pylarify) was injected intravenously. Full-ring PET imaging was performed from the skull base to thigh after the radiotracer. CT data was obtained and used for attenuation correction and anatomic localization. COMPARISON:  PSMA PET scan 12 1 22  FINDINGS: NECK No radiotracer activity in neck lymph nodes. Incidental CT finding: None. CHEST No radiotracer accumulation within mediastinal or hilar lymph nodes. No suspicious pulmonary nodules on the CT scan. Incidental CT finding: Port in the anterior chest wall with tip in distal SVC. ABDOMEN/PELVIS Prostate: No focal radiotracer activity in the prostate gland. Lymph nodes: Mild radiotracer activity associated with the small LEFT  external iliac lymph node along the operator space measuring 5 mm short axis with SUV max equal 3.7 compared SUV max equal 5.5 on comparison exam. (Image 206). The more peripheral LEFT external iliac node measures 5 mm (image 203/4) with SUV max equal 2.5 compared SUV max equal 3.9. Previously described RIGHT operator node has very low radiotracer activity (SUV max 1.23) compared SUV max equal 2.7. Current activity is not above background. No evidence radiotracer avid or enlarged lymph nodes outside  the pelvis Liver: No evidence of liver metastasis. Several low-density lesion liver unchanged from prior without radiotracer activity Incidental CT finding: None. SKELETON No focal activity to suggest skeletal metastasis. IMPRESSION: 1. Previous described small iliac pelvic lymph nodes have all decreased in radiotracer activity compared to PSMA PET scan 01/31/2021 and are unchanged in size. The now mild radiotracer activity favors reactive adenopathy over metastatic adenopathy. 2. No new progressive adenopathy in the abdomen pelvis. 3. No visceral metastasis or skeletal metastasis. Electronically Signed   By: Genevive Bi M.D.   On: 01/17/2022 13:56    Assessment & Plan:   Unstable knee, left -     Ambulatory referral to Orthopedic Surgery  Atherosclerosis of aorta (HCC)- Risk factor modifications addressed.  Hyperlipidemia LDL goal <100- LDL goal achieved. Doing well on the statin  -     TSH; Future  Iron deficiency anemia, unspecified iron deficiency anemia type- His iron is normal. -     IBC + Ferritin; Future  Abnormal electrocardiogram (ECG) (EKG)- I have ordered an echocardiogram to evaluate for wall motion abnormalities. -     EKG 12-Lead -     Troponin I (High Sensitivity); Future -     ECHOCARDIOGRAM COMPLETE; Future  Essential hypertension- His blood pressure is adequately well-controlled. -     EKG 12-Lead  Chronic fatigue -     Troponin I (High Sensitivity); Future      Follow-up: Return in about 3 months (around 09/24/2022).  Sanda Linger, MD

## 2022-06-25 NOTE — Patient Instructions (Signed)
Hypertension, Adult High blood pressure (hypertension) is when the force of blood pumping through the arteries is too strong. The arteries are the blood vessels that carry blood from the heart throughout the body. Hypertension forces the heart to work harder to pump blood and may cause arteries to become narrow or stiff. Untreated or uncontrolled hypertension can lead to a heart attack, heart failure, a stroke, kidney disease, and other problems. A blood pressure reading consists of a higher number over a lower number. Ideally, your blood pressure should be below 120/80. The first ("top") number is called the systolic pressure. It is a measure of the pressure in your arteries as your heart beats. The second ("bottom") number is called the diastolic pressure. It is a measure of the pressure in your arteries as the heart relaxes. What are the causes? The exact cause of this condition is not known. There are some conditions that result in high blood pressure. What increases the risk? Certain factors may make you more likely to develop high blood pressure. Some of these risk factors are under your control, including: Smoking. Not getting enough exercise or physical activity. Being overweight. Having too much fat, sugar, calories, or salt (sodium) in your diet. Drinking too much alcohol. Other risk factors include: Having a personal history of heart disease, diabetes, high cholesterol, or kidney disease. Stress. Having a family history of high blood pressure and high cholesterol. Having obstructive sleep apnea. Age. The risk increases with age. What are the signs or symptoms? High blood pressure may not cause symptoms. Very high blood pressure (hypertensive crisis) may cause: Headache. Fast or irregular heartbeats (palpitations). Shortness of breath. Nosebleed. Nausea and vomiting. Vision changes. Severe chest pain, dizziness, and seizures. How is this diagnosed? This condition is diagnosed by  measuring your blood pressure while you are seated, with your arm resting on a flat surface, your legs uncrossed, and your feet flat on the floor. The cuff of the blood pressure monitor will be placed directly against the skin of your upper arm at the level of your heart. Blood pressure should be measured at least twice using the same arm. Certain conditions can cause a difference in blood pressure between your right and left arms. If you have a high blood pressure reading during one visit or you have normal blood pressure with other risk factors, you may be asked to: Return on a different day to have your blood pressure checked again. Monitor your blood pressure at home for 1 week or longer. If you are diagnosed with hypertension, you may have other blood or imaging tests to help your health care provider understand your overall risk for other conditions. How is this treated? This condition is treated by making healthy lifestyle changes, such as eating healthy foods, exercising more, and reducing your alcohol intake. You may be referred for counseling on a healthy diet and physical activity. Your health care provider may prescribe medicine if lifestyle changes are not enough to get your blood pressure under control and if: Your systolic blood pressure is above 130. Your diastolic blood pressure is above 80. Your personal target blood pressure may vary depending on your medical conditions, your age, and other factors. Follow these instructions at home: Eating and drinking  Eat a diet that is high in fiber and potassium, and low in sodium, added sugar, and fat. An example of this eating plan is called the DASH diet. DASH stands for Dietary Approaches to Stop Hypertension. To eat this way: Eat   plenty of fresh fruits and vegetables. Try to fill one half of your plate at each meal with fruits and vegetables. Eat whole grains, such as whole-wheat pasta, brown rice, or whole-grain bread. Fill about one  fourth of your plate with whole grains. Eat or drink low-fat dairy products, such as skim milk or low-fat yogurt. Avoid fatty cuts of meat, processed or cured meats, and poultry with skin. Fill about one fourth of your plate with lean proteins, such as fish, chicken without skin, beans, eggs, or tofu. Avoid pre-made and processed foods. These tend to be higher in sodium, added sugar, and fat. Reduce your daily sodium intake. Many people with hypertension should eat less than 1,500 mg of sodium a day. Do not drink alcohol if: Your health care provider tells you not to drink. You are pregnant, may be pregnant, or are planning to become pregnant. If you drink alcohol: Limit how much you have to: 0-1 drink a day for women. 0-2 drinks a day for men. Know how much alcohol is in your drink. In the [place] one drink equals one 12 oz bottle of beer (355 mL), one 5 oz glass of wine (148 mL), or one 1 oz glass of hard liquor (44 mL). Lifestyle  Work with your health care provider to maintain a healthy body weight or to lose weight. Ask what an ideal weight is for you. Get at least 30 minutes of exercise that causes your heart to beat faster (aerobic exercise) most days of the week. Activities may include walking, swimming, or biking. Include exercise to strengthen your muscles (resistance exercise), such as Pilates or lifting weights, as part of your weekly exercise routine. Try to do these types of exercises for 30 minutes at least 3 days a week. Do not use any products that contain nicotine or tobacco. These products include cigarettes, chewing tobacco, and vaping devices, such as e-cigarettes. If you need help quitting, ask your health care provider. Monitor your blood pressure at home as told by your health care provider. Keep all follow-up visits. This is important. Medicines Take over-the-counter and prescription medicines only as told by your health care provider. Follow directions carefully. Blood  pressure medicines must be taken as prescribed. Do not skip doses of blood pressure medicine. Doing this puts you at risk for problems and can make the medicine less effective. Ask your health care provider about side effects or reactions to medicines that you should watch for. Contact a health care provider if you: Think you are having a reaction to a medicine you are taking. Have headaches that keep coming back (recurring). Feel dizzy. Have swelling in your ankles. Have trouble with your vision. Get help right away if you: Develop a severe headache or confusion. Have unusual weakness or numbness. Feel faint. Have severe pain in your chest or abdomen. Vomit repeatedly. Have trouble breathing. These symptoms may be an emergency. Get help right away. Call 911. Do not wait to see if the symptoms will go away. Do not drive yourself to the hospital. Summary Hypertension is when the force of blood pumping through your arteries is too strong. If this condition is not controlled, it may put you at risk for serious complications. Your personal target blood pressure may vary depending on your medical conditions, your age, and other factors. For most people, a normal blood pressure is less than 120/80. Hypertension is treated with lifestyle changes, medicines, or a combination of both. Lifestyle changes include losing weight, eating a healthy,   low-sodium diet, exercising more, and limiting alcohol. This information is not intended to replace advice given to you by your health care provider. Make sure you discuss any questions you have with your health care provider. Document Revised: 12/25/2020 Document Reviewed: 12/25/2020 Elsevier Patient Education  2023 Elsevier Inc.  

## 2022-07-07 DIAGNOSIS — M1712 Unilateral primary osteoarthritis, left knee: Secondary | ICD-10-CM | POA: Diagnosis not present

## 2022-07-08 ENCOUNTER — Ambulatory Visit: Payer: Medicare Other | Admitting: Orthopaedic Surgery

## 2022-07-21 ENCOUNTER — Inpatient Hospital Stay: Payer: Medicare Other | Attending: Oncology

## 2022-07-21 ENCOUNTER — Inpatient Hospital Stay: Payer: Medicare Other

## 2022-07-21 ENCOUNTER — Other Ambulatory Visit: Payer: Self-pay

## 2022-07-21 VITALS — BP 138/77 | HR 86 | Temp 98.5°F | Resp 16

## 2022-07-21 DIAGNOSIS — C779 Secondary and unspecified malignant neoplasm of lymph node, unspecified: Secondary | ICD-10-CM | POA: Insufficient documentation

## 2022-07-21 DIAGNOSIS — C61 Malignant neoplasm of prostate: Secondary | ICD-10-CM

## 2022-07-21 DIAGNOSIS — C7951 Secondary malignant neoplasm of bone: Secondary | ICD-10-CM | POA: Insufficient documentation

## 2022-07-21 DIAGNOSIS — Z95828 Presence of other vascular implants and grafts: Secondary | ICD-10-CM

## 2022-07-21 LAB — CMP (CANCER CENTER ONLY)
ALT: 12 U/L (ref 0–44)
AST: 17 U/L (ref 15–41)
Albumin: 4.1 g/dL (ref 3.5–5.0)
Alkaline Phosphatase: 51 U/L (ref 38–126)
Anion gap: 6 (ref 5–15)
BUN: 25 mg/dL — ABNORMAL HIGH (ref 8–23)
CO2: 25 mmol/L (ref 22–32)
Calcium: 8.9 mg/dL (ref 8.9–10.3)
Chloride: 106 mmol/L (ref 98–111)
Creatinine: 1.26 mg/dL — ABNORMAL HIGH (ref 0.61–1.24)
GFR, Estimated: 59 mL/min — ABNORMAL LOW (ref >60)
Glucose, Bld: 119 mg/dL — ABNORMAL HIGH (ref 70–99)
Potassium: 4.6 mmol/L (ref 3.5–5.1)
Sodium: 137 mmol/L (ref 135–145)
Total Bilirubin: 0.5 mg/dL (ref 0.3–1.2)
Total Protein: 6.5 g/dL (ref 6.5–8.1)

## 2022-07-21 LAB — CBC WITH DIFFERENTIAL (CANCER CENTER ONLY)
Abs Immature Granulocytes: 0.02 10*3/uL (ref 0.00–0.07)
Basophils Absolute: 0 10*3/uL (ref 0.0–0.1)
Basophils Relative: 0 %
Eosinophils Absolute: 0 10*3/uL (ref 0.0–0.5)
Eosinophils Relative: 1 %
HCT: 35.8 % — ABNORMAL LOW (ref 39.0–52.0)
Hemoglobin: 11.9 g/dL — ABNORMAL LOW (ref 13.0–17.0)
Immature Granulocytes: 0 %
Lymphocytes Relative: 29 %
Lymphs Abs: 1.6 10*3/uL (ref 0.7–4.0)
MCH: 32 pg (ref 26.0–34.0)
MCHC: 33.2 g/dL (ref 30.0–36.0)
MCV: 96.2 fL (ref 80.0–100.0)
Monocytes Absolute: 0.5 10*3/uL (ref 0.1–1.0)
Monocytes Relative: 9 %
Neutro Abs: 3.3 10*3/uL (ref 1.7–7.7)
Neutrophils Relative %: 61 %
Platelet Count: 213 10*3/uL (ref 150–400)
RBC: 3.72 MIL/uL — ABNORMAL LOW (ref 4.22–5.81)
RDW: 13.6 % (ref 11.5–15.5)
WBC Count: 5.4 10*3/uL (ref 4.0–10.5)
nRBC: 0 % (ref 0.0–0.2)

## 2022-07-21 MED ORDER — SODIUM CHLORIDE 0.9% FLUSH
10.0000 mL | Freq: Once | INTRAVENOUS | Status: AC
  Administered 2022-07-21: 10 mL

## 2022-07-21 MED ORDER — DENOSUMAB 120 MG/1.7ML ~~LOC~~ SOLN
120.0000 mg | Freq: Once | SUBCUTANEOUS | Status: AC
  Administered 2022-07-21: 120 mg via SUBCUTANEOUS
  Filled 2022-07-21: qty 1.7

## 2022-07-21 MED ORDER — HEPARIN SOD (PORK) LOCK FLUSH 100 UNIT/ML IV SOLN
500.0000 [IU] | Freq: Once | INTRAVENOUS | Status: AC
  Administered 2022-07-21: 500 [IU]

## 2022-07-21 NOTE — Patient Instructions (Signed)
Denosumab Injection (Oncology) What is this medication? DENOSUMAB (den oh SUE mab) prevents weakened bones caused by cancer. It may also be used to treat noncancerous bone tumors that cannot be removed by surgery. It can also be used to treat high calcium levels in the blood caused by cancer. It works by blocking a protein that causes bones to break down quickly. This slows down the release of calcium from bones, which lowers calcium levels in your blood. It also makes your bones stronger and less likely to break (fracture). This medicine may be used for other purposes; ask your health care provider or pharmacist if you have questions. COMMON BRAND NAME(S): XGEVA What should I tell my care team before I take this medication? They need to know if you have any of these conditions: Dental disease Having surgery or tooth extraction Infection Kidney disease Low levels of calcium or vitamin D in the blood Malnutrition On hemodialysis Skin conditions or sensitivity Thyroid or parathyroid disease An unusual reaction to denosumab, other medications, foods, dyes, or preservatives Pregnant or trying to get pregnant Breast-feeding How should I use this medication? This medication is for injection under the skin. It is given by your care team in a hospital or clinic setting. A special MedGuide will be given to you before each treatment. Be sure to read this information carefully each time. Talk to your care team about the use of this medication in children. While it may be prescribed for children as young as 13 years for selected conditions, precautions do apply. Overdosage: If you think you have taken too much of this medicine contact a poison control center or emergency room at once. NOTE: This medicine is only for you. Do not share this medicine with others. What if I miss a dose? Keep appointments for follow-up doses. It is important not to miss your dose. Call your care team if you are unable to  keep an appointment. What may interact with this medication? Do not take this medication with any of the following: Other medications containing denosumab This medication may also interact with the following: Medications that lower your chance of fighting infection Steroid medications, such as prednisone or cortisone This list may not describe all possible interactions. Give your health care provider a list of all the medicines, herbs, non-prescription drugs, or dietary supplements you use. Also tell them if you smoke, drink alcohol, or use illegal drugs. Some items may interact with your medicine. What should I watch for while using this medication? Your condition will be monitored carefully while you are receiving this medication. You may need blood work while taking this medication. This medication may increase your risk of getting an infection. Call your care team for advice if you get a fever, chills, sore throat, or other symptoms of a cold or flu. Do not treat yourself. Try to avoid being around people who are sick. You should make sure you get enough calcium and vitamin D while you are taking this medication, unless your care team tells you not to. Discuss the foods you eat and the vitamins you take with your care team. Some people who take this medication have severe bone, joint, or muscle pain. This medication may also increase your risk for jaw problems or a broken thigh bone. Tell your care team right away if you have severe pain in your jaw, bones, joints, or muscles. Tell your care team if you have any pain that does not go away or that gets worse. Talk   to your care team if you may be pregnant. Serious birth defects can occur if you take this medication during pregnancy and for 5 months after the last dose. You will need a negative pregnancy test before starting this medication. Contraception is recommended while taking this medication and for 5 months after the last dose. Your care team  can help you find the option that works for you. What side effects may I notice from receiving this medication? Side effects that you should report to your care team as soon as possible: Allergic reactions--skin rash, itching, hives, swelling of the face, lips, tongue, or throat Bone, joint, or muscle pain Low calcium level--muscle pain or cramps, confusion, tingling, or numbness in the hands or feet Osteonecrosis of the jaw--pain, swelling, or redness in the mouth, numbness of the jaw, poor healing after dental work, unusual discharge from the mouth, visible bones in the mouth Side effects that usually do not require medical attention (report to your care team if they continue or are bothersome): Cough Diarrhea Fatigue Headache Nausea This list may not describe all possible side effects. Call your doctor for medical advice about side effects. You may report side effects to FDA at 1-800-FDA-1088. Where should I keep my medication? This medication is given in a hospital or clinic. It will not be stored at home. NOTE: This sheet is a summary. It may not cover all possible information. If you have questions about this medicine, talk to your doctor, pharmacist, or health care provider.  2023 [employer]vier/Gold Standard (2021-07-08 00:00:00)  

## 2022-07-23 ENCOUNTER — Telehealth: Payer: Self-pay | Admitting: Hematology and Oncology

## 2022-07-23 IMAGING — DX DG KNEE COMPLETE 4+V*L*
4 series · 4 of 4 positions shown · non-contrast
Comparison: 12/14/2019

CLINICAL DATA: Medial knee pain and swelling for 10 days, no
specific injury

EXAM:
LEFT KNEE - COMPLETE 4+ VIEW

[knee ap]
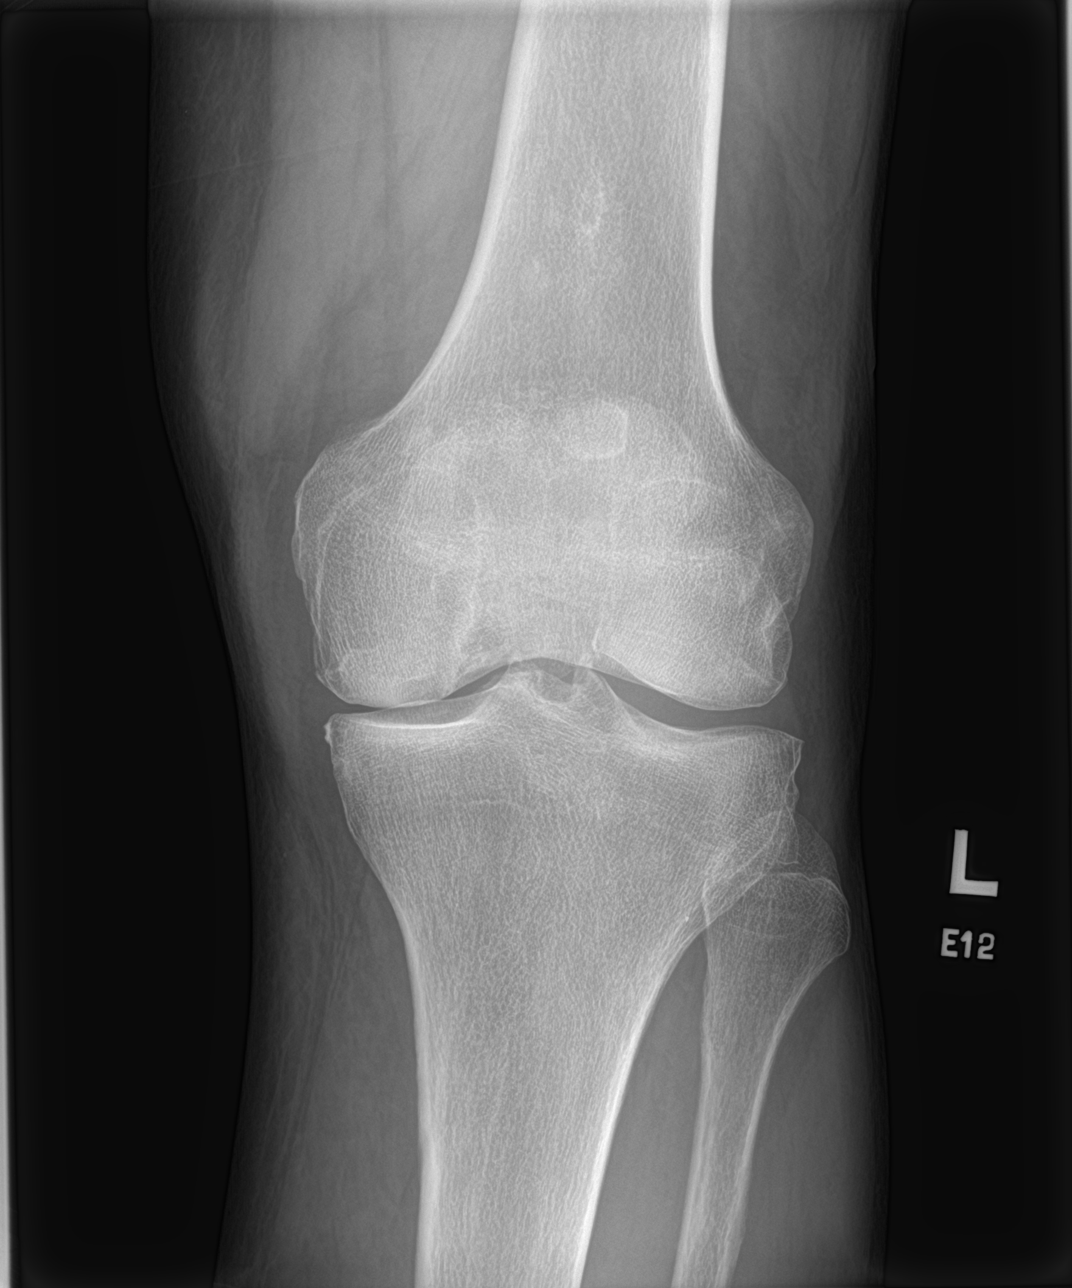

[knee lat]
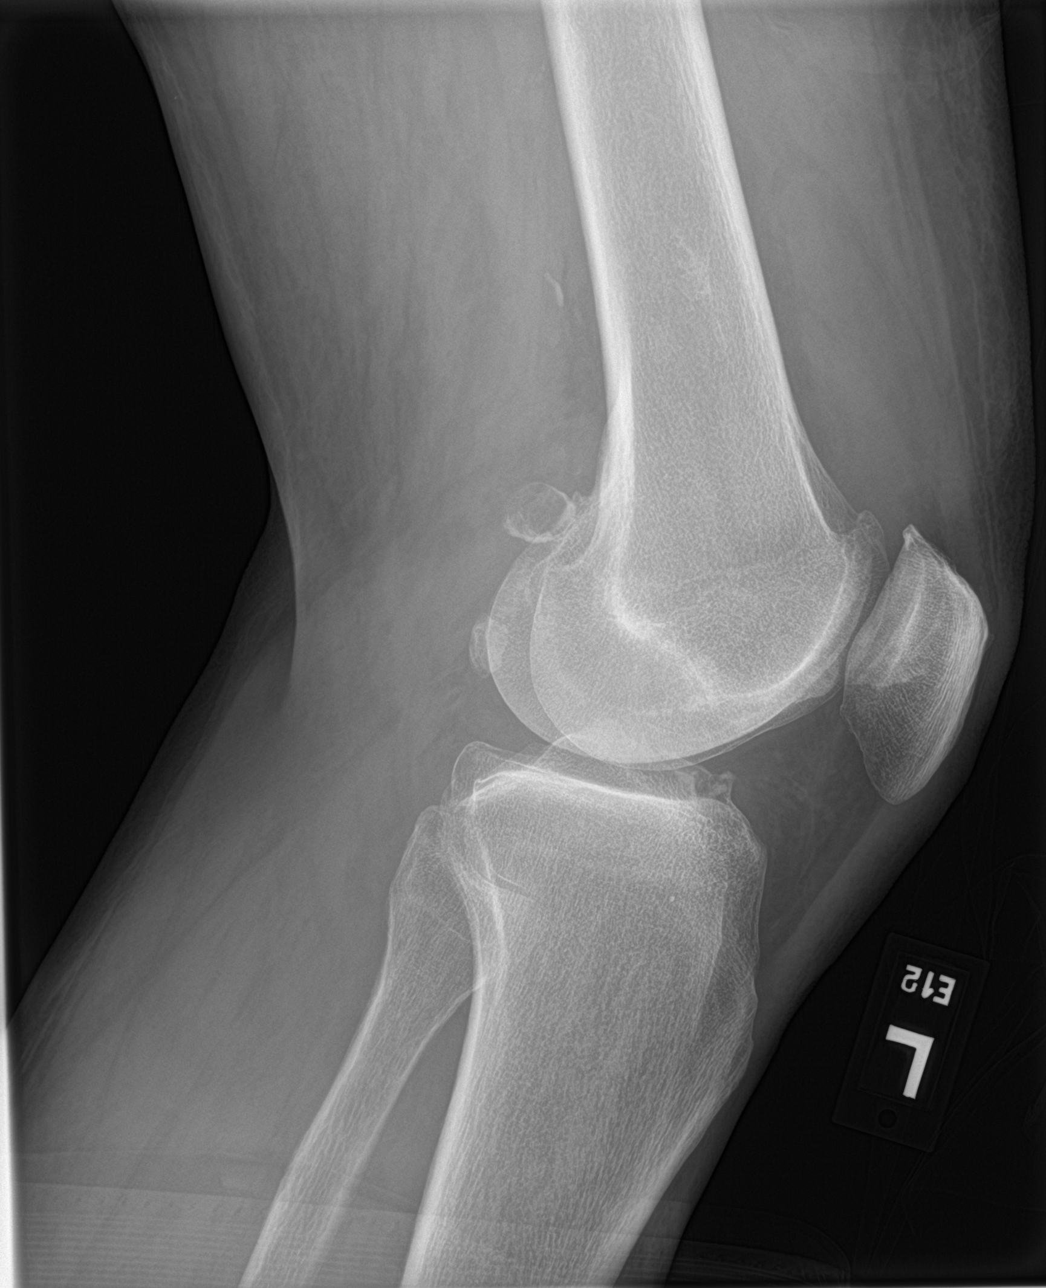

[knee obl (1 of 2)]
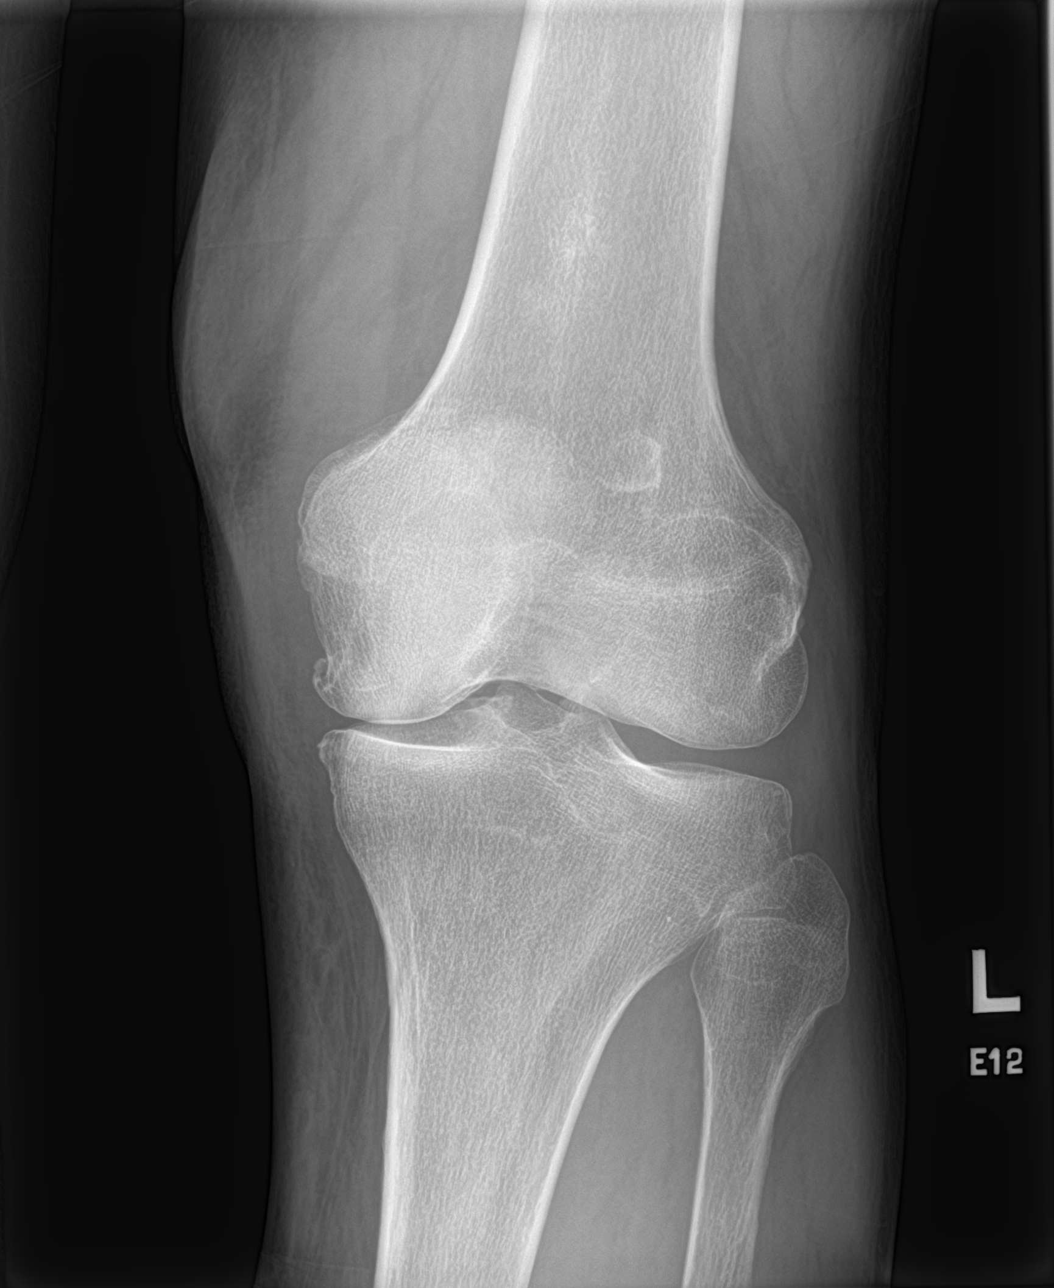

[knee obl (2 of 2)]
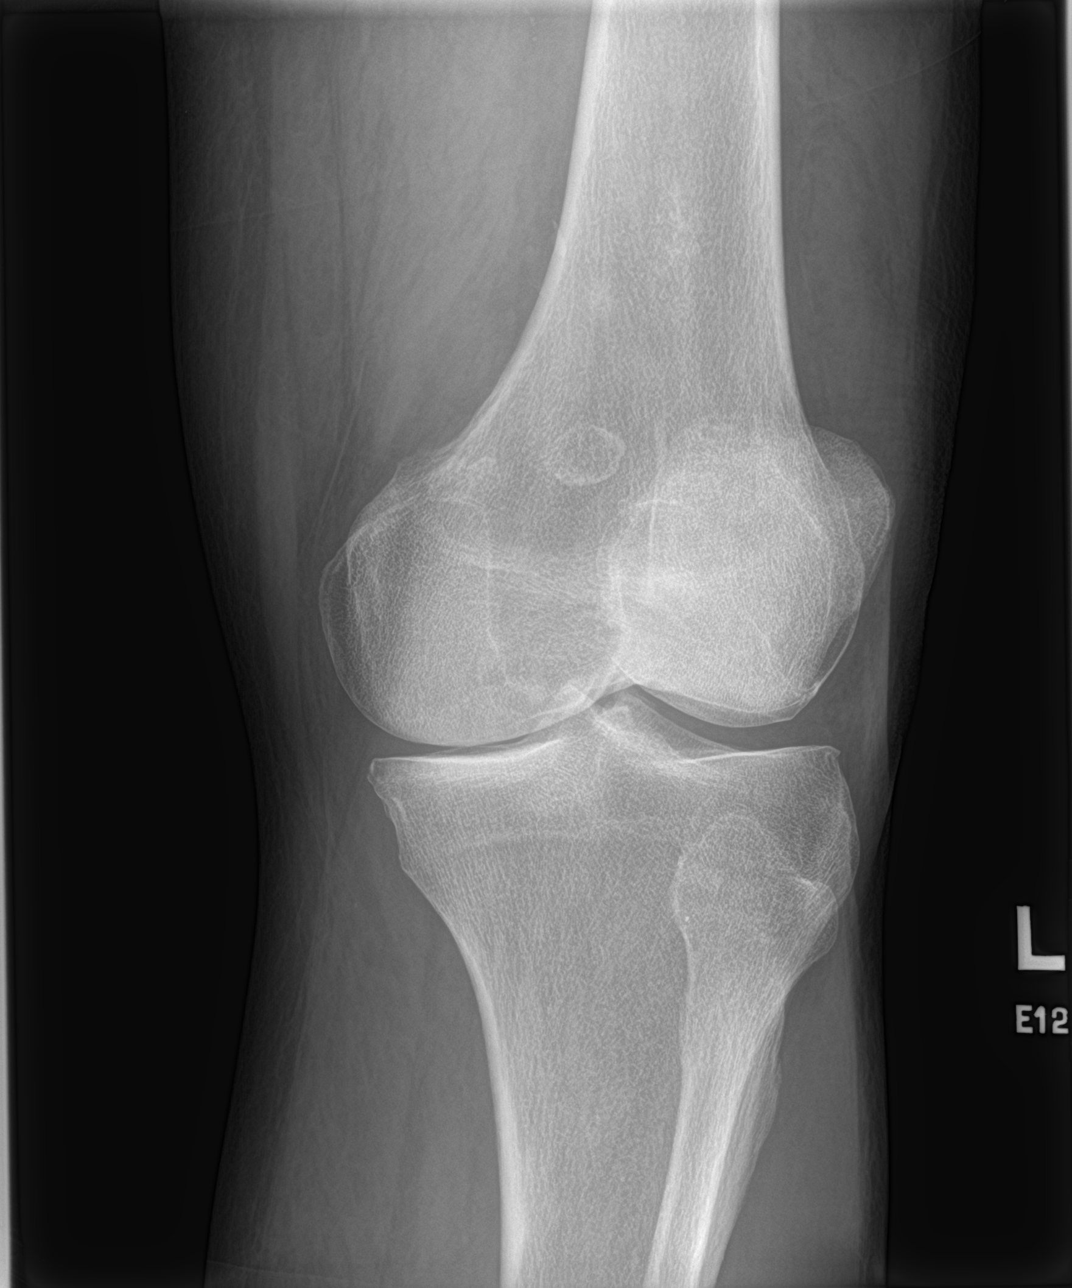

[4 of 4 positions shown; findings below may reference images not displayed]

FINDINGS: No fracture or dislocation of the left knee. Mild medial compartment
and patellofemoral arthrosis, with preserved lateral compartment.
Probable loose bodies in the posterior joint space. Moderate knee
joint effusion. Soft unremarkable.
IMPRESSION: 1.  No fracture or dislocation of the left knee.

2. Mild medial compartment and patellofemoral arthrosis, with
preserved lateral compartment. Probable loose bodies in the
posterior joint space.

3.  Moderate knee joint effusion.

## 2022-07-24 ENCOUNTER — Ambulatory Visit (INDEPENDENT_AMBULATORY_CARE_PROVIDER_SITE_OTHER): Payer: Medicare Other

## 2022-07-24 DIAGNOSIS — R9431 Abnormal electrocardiogram [ECG] [EKG]: Secondary | ICD-10-CM | POA: Diagnosis not present

## 2022-07-26 ENCOUNTER — Other Ambulatory Visit: Payer: Self-pay | Admitting: Internal Medicine

## 2022-07-26 DIAGNOSIS — I10 Essential (primary) hypertension: Secondary | ICD-10-CM

## 2022-07-26 DIAGNOSIS — E876 Hypokalemia: Secondary | ICD-10-CM

## 2022-07-28 LAB — ECHOCARDIOGRAM COMPLETE
Area-P 1/2: 3.53 cm2
Calc EF: 55.6 %
S' Lateral: 3.08 cm
Single Plane A2C EF: 57.1 %
Single Plane A4C EF: 53.5 %

## 2022-07-30 ENCOUNTER — Encounter: Payer: Self-pay | Admitting: Internal Medicine

## 2022-08-04 ENCOUNTER — Other Ambulatory Visit: Payer: Self-pay | Admitting: Internal Medicine

## 2022-08-04 DIAGNOSIS — E785 Hyperlipidemia, unspecified: Secondary | ICD-10-CM

## 2022-08-04 DIAGNOSIS — I10 Essential (primary) hypertension: Secondary | ICD-10-CM

## 2022-08-04 DIAGNOSIS — I700 Atherosclerosis of aorta: Secondary | ICD-10-CM

## 2022-08-05 ENCOUNTER — Encounter: Payer: Self-pay | Admitting: Hematology and Oncology

## 2022-08-06 ENCOUNTER — Other Ambulatory Visit: Payer: Self-pay | Admitting: *Deleted

## 2022-08-06 DIAGNOSIS — C61 Malignant neoplasm of prostate: Secondary | ICD-10-CM

## 2022-08-06 MED ORDER — ENZALUTAMIDE 40 MG PO TABS
ORAL_TABLET | ORAL | 6 refills | Status: DC
Start: 2022-08-06 — End: 2022-08-06

## 2022-08-06 MED ORDER — ENZALUTAMIDE 40 MG PO TABS
ORAL_TABLET | ORAL | 6 refills | Status: DC
Start: 2022-08-06 — End: 2023-03-05

## 2022-08-11 DIAGNOSIS — R3915 Urgency of urination: Secondary | ICD-10-CM | POA: Diagnosis not present

## 2022-08-11 DIAGNOSIS — N1330 Unspecified hydronephrosis: Secondary | ICD-10-CM | POA: Diagnosis not present

## 2022-08-11 DIAGNOSIS — C7951 Secondary malignant neoplasm of bone: Secondary | ICD-10-CM | POA: Diagnosis not present

## 2022-08-11 DIAGNOSIS — C775 Secondary and unspecified malignant neoplasm of intrapelvic lymph nodes: Secondary | ICD-10-CM | POA: Diagnosis not present

## 2022-08-11 DIAGNOSIS — C61 Malignant neoplasm of prostate: Secondary | ICD-10-CM | POA: Diagnosis not present

## 2022-08-18 ENCOUNTER — Inpatient Hospital Stay: Payer: Medicare Other | Admitting: Hematology and Oncology

## 2022-08-18 ENCOUNTER — Inpatient Hospital Stay: Payer: Medicare Other

## 2022-08-19 ENCOUNTER — Inpatient Hospital Stay: Payer: Medicare Other | Attending: Oncology

## 2022-08-19 DIAGNOSIS — C779 Secondary and unspecified malignant neoplasm of lymph node, unspecified: Secondary | ICD-10-CM | POA: Diagnosis not present

## 2022-08-19 DIAGNOSIS — Z95828 Presence of other vascular implants and grafts: Secondary | ICD-10-CM

## 2022-08-19 DIAGNOSIS — I10 Essential (primary) hypertension: Secondary | ICD-10-CM | POA: Diagnosis not present

## 2022-08-19 DIAGNOSIS — Z807 Family history of other malignant neoplasms of lymphoid, hematopoietic and related tissues: Secondary | ICD-10-CM | POA: Insufficient documentation

## 2022-08-19 DIAGNOSIS — C61 Malignant neoplasm of prostate: Secondary | ICD-10-CM | POA: Diagnosis not present

## 2022-08-19 DIAGNOSIS — C7951 Secondary malignant neoplasm of bone: Secondary | ICD-10-CM | POA: Diagnosis not present

## 2022-08-19 LAB — CBC WITH DIFFERENTIAL (CANCER CENTER ONLY)
Abs Immature Granulocytes: 0.02 10*3/uL (ref 0.00–0.07)
Basophils Absolute: 0 10*3/uL (ref 0.0–0.1)
Basophils Relative: 0 %
Eosinophils Absolute: 0.1 10*3/uL (ref 0.0–0.5)
Eosinophils Relative: 1 %
HCT: 36.6 % — ABNORMAL LOW (ref 39.0–52.0)
Hemoglobin: 12.1 g/dL — ABNORMAL LOW (ref 13.0–17.0)
Immature Granulocytes: 0 %
Lymphocytes Relative: 28 %
Lymphs Abs: 1.6 10*3/uL (ref 0.7–4.0)
MCH: 32.1 pg (ref 26.0–34.0)
MCHC: 33.1 g/dL (ref 30.0–36.0)
MCV: 97.1 fL (ref 80.0–100.0)
Monocytes Absolute: 0.5 10*3/uL (ref 0.1–1.0)
Monocytes Relative: 9 %
Neutro Abs: 3.6 10*3/uL (ref 1.7–7.7)
Neutrophils Relative %: 62 %
Platelet Count: 236 10*3/uL (ref 150–400)
RBC: 3.77 MIL/uL — ABNORMAL LOW (ref 4.22–5.81)
RDW: 13.6 % (ref 11.5–15.5)
WBC Count: 5.9 10*3/uL (ref 4.0–10.5)
nRBC: 0 % (ref 0.0–0.2)

## 2022-08-19 LAB — CMP (CANCER CENTER ONLY)
ALT: 12 U/L (ref 0–44)
AST: 16 U/L (ref 15–41)
Albumin: 3.9 g/dL (ref 3.5–5.0)
Alkaline Phosphatase: 44 U/L (ref 38–126)
Anion gap: 7 (ref 5–15)
BUN: 27 mg/dL — ABNORMAL HIGH (ref 8–23)
CO2: 25 mmol/L (ref 22–32)
Calcium: 9.7 mg/dL (ref 8.9–10.3)
Chloride: 106 mmol/L (ref 98–111)
Creatinine: 1.1 mg/dL (ref 0.61–1.24)
GFR, Estimated: >60 mL/min (ref >60)
Glucose, Bld: 129 mg/dL — ABNORMAL HIGH (ref 70–99)
Potassium: 4.3 mmol/L (ref 3.5–5.1)
Sodium: 138 mmol/L (ref 135–145)
Total Bilirubin: 0.5 mg/dL (ref 0.3–1.2)
Total Protein: 6.3 g/dL — ABNORMAL LOW (ref 6.5–8.1)

## 2022-08-19 MED ORDER — HEPARIN SOD (PORK) LOCK FLUSH 100 UNIT/ML IV SOLN
500.0000 [IU] | Freq: Once | INTRAVENOUS | Status: AC
  Administered 2022-08-19: 500 [IU]

## 2022-08-19 MED ORDER — SODIUM CHLORIDE 0.9% FLUSH
10.0000 mL | Freq: Once | INTRAVENOUS | Status: AC
  Administered 2022-08-19: 10 mL

## 2022-08-20 ENCOUNTER — Inpatient Hospital Stay (HOSPITAL_BASED_OUTPATIENT_CLINIC_OR_DEPARTMENT_OTHER): Payer: Medicare Other | Admitting: Hematology and Oncology

## 2022-08-20 ENCOUNTER — Inpatient Hospital Stay: Payer: Medicare Other

## 2022-08-20 VITALS — BP 137/89 | HR 73 | Temp 97.3°F | Resp 15 | Wt 197.6 lb

## 2022-08-20 DIAGNOSIS — C61 Malignant neoplasm of prostate: Secondary | ICD-10-CM

## 2022-08-20 DIAGNOSIS — I10 Essential (primary) hypertension: Secondary | ICD-10-CM | POA: Diagnosis not present

## 2022-08-20 DIAGNOSIS — Z807 Family history of other malignant neoplasms of lymphoid, hematopoietic and related tissues: Secondary | ICD-10-CM | POA: Diagnosis not present

## 2022-08-20 DIAGNOSIS — C7951 Secondary malignant neoplasm of bone: Secondary | ICD-10-CM | POA: Diagnosis not present

## 2022-08-20 DIAGNOSIS — Z95828 Presence of other vascular implants and grafts: Secondary | ICD-10-CM

## 2022-08-20 DIAGNOSIS — C779 Secondary and unspecified malignant neoplasm of lymph node, unspecified: Secondary | ICD-10-CM | POA: Diagnosis not present

## 2022-08-20 NOTE — Progress Notes (Signed)
[facility] [phone]   [phone]  PROGRESS NOTE  Patient Care Team: Etta Grandchild, MD as PCP - General (Internal Medicine) Hurshel Party, OD as Consulting Physician (Optometry) Berneice Heinrich, Delbert Phenix., MD as Consulting Physician (Urology) Benjiman Core, MD (Inactive) as Consulting Physician (Oncology)  Hematological/Oncological History # Castrate Resistant Advanced Prostate Cancer # Metastatic Prostate Cancer to Lymph Nodes/Bone  01/2015: orchiectomy performed 02/16/2015: Taxotere chemotherapy at 75 mg/m. Continued until 05/2015 09/2015: start of zytiga. Stopped in 2023 due to insurance issues 04/2021: Xtandi 160 mg daily  03/19/2022: last visit with Dr. Clelia Croft 05/20/2022: transition care to Dr. Leonides Schanz   Interval History:  [name] 77 y.o. 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 The patient's last visit was on 05/20/2022. In the interim since the last visit he has continued on Xtandi therapy.   On exam today Mr. Froio reports he has been well overall in the interim since her last visit.  He reports that his energy levels tend to decrease in the late afternoon.  He notes he is interested in trying vitamin B supplementation to improve this.  He continues taking his enzalutamide medication without difficulty.  Overall he feels well with no questions concerns or complaints today.  He reports otherwise no fevers, chills, sweats, nausea vomiting or diarrhea.  A full 10 point ROS was otherwise negative.  MEDICAL HISTORY:  Past Medical History:  Diagnosis Date   Atherosclerosis of aorta (HCC) 03/11/2017   The 10-year ASCVD risk score Denman George DC Jr., et al., 2013) is: 24.6%   Values used to calculate the score:     Age: 37 years     Sex: Male     Is Non-Hispanic African American: No     Diabetic: No     Tobacco smoker: No      Systolic Blood Pressure: 154 mmHg     Is BP treated: No     HDL Cholesterol: 71.1 mg/dL     Total Cholesterol: 212 mg/dL   Cancer (HCC)    PROSTATE   Chronic idiopathic constipation 03/11/2017   Edema leg Sept 28, 2016   left leg from foot to thigh, increasinly worse over last 4 weeks   Essential hypertension 03/30/2018   Gastroesophageal reflux disease without esophagitis 03/11/2017   Hyperlipidemia LDL goal <100 03/16/2017   The 10-year ASCVD risk score Denman George DC Jr., et al., 2013) is: 30.6%   Values used to calculate the score:     Age: 6 years     Sex: Male     Is Non-Hispanic African American: No     Diabetic: No     Tobacco smoker: No     Systolic Blood Pressure: 148 mmHg     Is BP treated: Yes     HDL Cholesterol: 69.3 mg/dL     Total Cholesterol: 211 mg/dL   Hypertension    Hypoglycemia    Swelling LAST 30 DAYS DR [facility] AWARE   LEFT LEG AND FOOT    SURGICAL HISTORY: Past Surgical History:  Procedure Laterality Date   BOWEL DECOMPRESSION N/A 03/07/2021   Procedure: BOWEL DECOMPRESSION;  Surgeon: Benancio Deeds, MD;  Location: [facility];  Service: Gastroenterology;  Laterality: N/A;   BOWEL DECOMPRESSION N/A 06/07/2021   Procedure: BOWEL DECOMPRESSION;  Surgeon: Jeani Hawking, MD;  Location: [facility];  Service: Gastroenterology;  Laterality: N/A;  FLEXIBLE SIGMOIDOSCOPY N/A 03/07/2021   Procedure: FLEXIBLE SIGMOIDOSCOPY;  Surgeon: Benancio Deeds, MD;  Location: [facility];  Service: Gastroenterology;  Laterality: N/A;   FLEXIBLE SIGMOIDOSCOPY N/A 06/07/2021   Procedure: FLEXIBLE SIGMOIDOSCOPY;  Surgeon: Jeani Hawking, MD;  Location: [facility];  Service: Gastroenterology;  Laterality: N/A;   GUM SURGERY     TEETH IMPLANTS ALSO   LAPAROSCOPIC SIGMOID COLECTOMY N/A 06/10/2021   Procedure: LAPAROSCOPIC ASSISTED SIGMOID COLECTOMY;  Surgeon: Luretha Murphy, MD;  Location: [facility];  Service: General;  Laterality: N/A;   ORCHIECTOMY Bilateral 01/03/2015   Procedure:  ORCHIECTOMY;  Surgeon: Sebastian Ache, MD;  Location: [facility];  Service: Urology;  Laterality: Bilateral;    SOCIAL HISTORY: Social History   Socioeconomic History   Marital status: Married    Spouse name: Not on file   Number of children: Not on file   Years of education: Not on file   Highest education level: Bachelor's degree (e.g., BA, AB, BS)  Occupational History   Not on file  Tobacco Use   Smoking status: Never   Smokeless tobacco: Never  Vaping Use   Vaping Use: Never used  Substance and Sexual Activity   Alcohol use: Yes    Alcohol/week: 1.0 standard drink of alcohol    Types: 1 Glasses of wine per week    Comment: occasional wine with dinner   Drug use: No   Sexual activity: Not on file  Other Topics Concern   Not on file  Social History Narrative   Not on file   Social Determinants of Health   Financial Resource Strain: Low Risk  (06/21/2022)   Overall Financial Resource Strain (CARDIA)    Difficulty of Paying Living Expenses: Not hard at all  Food Insecurity: No Food Insecurity (06/21/2022)   Hunger Vital Sign    Worried About Running Out of Food in the Last Year: Never true    Ran Out of Food in the Last Year: Never true  Transportation Needs: No Transportation Needs (06/21/2022)   PRAPARE - Administrator, Civil Service (Medical): No    Lack of Transportation (Non-Medical): No  Physical Activity: Sufficiently Active (06/21/2022)   Exercise Vital Sign    Days of Exercise per Week: 7 days    Minutes of Exercise per Session: 30 min  Stress: Stress Concern Present (06/21/2022)   [employer] of Occupational Health - Occupational Stress Questionnaire    Feeling of Stress : To some extent  Social Connections: Socially Isolated (06/21/2022)   Social Connection and Isolation Panel [NHANES]    Frequency of Communication with Friends and Family: Once a week    Frequency of Social Gatherings with Friends and Family: Never    Attends Religious  Services: Never    Database administrator or Organizations: No    Attends Banker Meetings: Never    Marital Status: Married  Catering manager Violence: Not At Risk (12/04/2021)   Humiliation, Afraid, Rape, and Kick questionnaire    Fear of Current or Ex-Partner: No    Emotionally Abused: No    Physically Abused: No    Sexually Abused: No    FAMILY HISTORY: Family History  Problem Relation Age of Onset   CVA Mother    Lymphoma Mother    Pneumonia Mother    CAD Father    Osteoarthritis Brother    Colon cancer Neg Hx     ALLERGIES:  is allergic to prednisone.  MEDICATIONS:  Current Outpatient Medications  Medication Sig Dispense Refill   aspirin EC 81 MG tablet Take 81 mg by mouth daily. Swallow whole.     atorvastatin (LIPITOR) 10 MG tablet TAKE 1 TABLET BY MOUTH EVERY DAY 90 tablet 1   BIOTIN 5000 PO Take 1 tablet by mouth daily.     CALCIUM-VITAMIN D PO Take 1 tablet by mouth daily.     Denosumab (XGEVA [place]) Inject into the skin. Every 6-8 weeks.     enzalutamide (XTANDI) 40 MG tablet Take 4 tablets (160mg ) by mouth once daily as directed by physician. 120 tablet 6   ferrous sulfate 325 (65 FE) MG EC tablet TAKE 1 TABLET BY MOUTH EVERY DAY WITH BREAKFAST 90 tablet 1   fexofenadine (ALLEGRA) 180 MG tablet Take 180 mg by mouth daily.     hydrOXYzine (ATARAX/VISTARIL) 10 MG tablet Take 10 mg by mouth daily.     KLOR-CON M20 20 MEQ tablet TAKE 1 TABLET BY MOUTH TWICE A DAY 180 tablet 0   lidocaine-prilocaine (EMLA) cream Apply 1 Application topically as needed (access port). 30 g 0   LORazepam (ATIVAN) 1 MG tablet TAKE 1 TABLET BY MOUTH EVERY 8 HOURS AS NEEDED FOR ANXIETY 90 tablet 0   Multiple Vitamin (MULTIVITAMIN) tablet Take 1 tablet by mouth daily.     olmesartan (BENICAR) 40 MG tablet TAKE 1 TABLET BY MOUTH EVERY DAY 90 tablet 0   omeprazole (PRILOSEC) 20 MG capsule TAKE 1 CAPSULE BY MOUTH DAILY AS NEEDED. 90 capsule 1   No current facility-administered  medications for this visit.    REVIEW OF SYSTEMS:   Constitutional: ( - ) fevers, ( - )  chills , ( - ) night sweats Eyes: ( - ) blurriness of vision, ( - ) double vision, ( - ) watery eyes Ears, nose, mouth, throat, and face: ( - ) mucositis, ( - ) sore throat Respiratory: ( - ) cough, ( - ) dyspnea, ( - ) wheezes Cardiovascular: ( - ) palpitation, ( - ) chest discomfort, ( - ) lower extremity swelling Gastrointestinal:  ( - ) nausea, ( - ) heartburn, ( - ) change in bowel habits Skin: ( - ) abnormal skin rashes Lymphatics: ( - ) new lymphadenopathy, ( - ) easy bruising Neurological: ( - ) numbness, ( - ) tingling, ( - ) new weaknesses Behavioral/Psych: ( - ) mood change, ( - ) new changes  All other systems were reviewed with the patient and are negative.  PHYSICAL EXAMINATION: ECOG PERFORMANCE STATUS: 0 - Asymptomatic  Vitals:   08/20/22 1018  BP: 137/89  Pulse: 73  Resp: 15  Temp: (!) 97.3 F (36.3 C)  SpO2: 99%    Filed Weights   08/20/22 1018  Weight: 197 lb 9.6 oz (89.6 kg)     GENERAL: Well-appearing elderly Caucasian male, alert, no distress and comfortable SKIN: skin color, texture, turgor are normal, no rashes or significant lesions EYES: conjunctiva are pink and non-injected, sclera clear LUNGS: clear to auscultation and percussion with normal breathing effort HEART: regular rate & rhythm and no murmurs and no lower extremity edema Musculoskeletal: no cyanosis of digits and no clubbing  PSYCH: alert & oriented x 3, fluent speech NEURO: no focal motor/sensory deficits  LABORATORY DATA:  I have reviewed the data as listed    Latest Ref Rng & Units 08/19/2022    1:23 PM 07/21/2022    1:08 PM 06/16/2022    2:00 PM  CBC  WBC 4.0 - 10.5 K/uL 5.9  5.4  5.3   Hemoglobin 13.0 - 17.0 g/dL 16.1  09.6  04.5   Hematocrit 39.0 - 52.0 % 36.6  35.8  34.8   Platelets 150 - 400 K/uL 236  213  210        Latest Ref Rng & Units 08/19/2022    1:23 PM 07/21/2022    1:08  PM 06/16/2022    2:00 PM  CMP  Glucose 70 - 99 mg/dL 409  811  914   BUN 8 - 23 mg/dL 27  25  26    Creatinine 0.61 - 1.24 mg/dL 7.82  9.56  2.13   Sodium 135 - 145 mmol/L 138  137  135   Potassium 3.5 - 5.1 mmol/L 4.3  4.6  4.4   Chloride 98 - 111 mmol/L 106  106  103   CO2 22 - 32 mmol/L 25  25  26    Calcium 8.9 - 10.3 mg/dL 9.7  8.9  9.6   Total Protein 6.5 - 8.1 g/dL 6.3  6.5  6.9   Total Bilirubin 0.3 - 1.2 mg/dL 0.5  0.5  0.4   Alkaline Phos 38 - 126 U/L 44  51  53   AST 15 - 41 U/L 16  17  17    ALT 0 - 44 U/L 12  12  11      RADIOGRAPHIC STUDIES: ECHOCARDIOGRAM COMPLETE  Result Date: 07/28/2022    ECHOCARDIOGRAM REPORT   Patient Name:   CLIMMIE Ellingsen Date of Exam: 07/24/2022 Medical Rec #:  [id number]   Height:       76.0 in Accession #:    [id number]  Weight:       195.0 lb Date of Birth:  December 30, 1945    BSA:          2.192 m Patient Age:    76 years    BP:           142/72 mmHg Patient Gender: M           HR:           62 bpm. Exam Location:  Outpatient Procedure: 2D Echo, 3D Echo, Cardiac Doppler, Color Doppler and Strain Analysis Indications:    R94.31 Abnormal EKG  History:        Patient has no prior history of Echocardiogram examinations.                 Risk Factors:Non-Smoker, Dyslipidemia and Hypertension. History                 of Prostate Cancer.  Sonographer:    Jeryl Columbia RDCS Referring Phys: [phone] Etta Grandchild IMPRESSIONS  1. Left ventricular ejection fraction, by estimation, is 55 to 60%. The left ventricle has normal function. The left ventricle has no regional wall motion abnormalities. Left ventricular diastolic parameters were normal.  2. Right ventricular systolic function is normal. The right ventricular size is normal. Tricuspid regurgitation signal is inadequate for assessing PA pressure.  3. The mitral valve is normal in structure. Trivial mitral valve regurgitation. No evidence of mitral stenosis.  4. The aortic valve is grossly normal. There is mild calcification  of the aortic valve. There is mild thickening of the aortic valve. Aortic valve regurgitation is not visualized. Aortic valve sclerosis is present, with no evidence of aortic valve stenosis.  5. Aortic dilatation noted. There is mild dilatation of the ascending aorta, measuring 40 mm.  6. The inferior vena cava is normal in size with  greater than 50% respiratory variability, suggesting right atrial pressure of 3 mmHg. Comparison(s): No prior Echocardiogram. Conclusion(s)/Recommendation(s): Otherwise normal echocardiogram, with minor abnormalities described in the report. FINDINGS  Left Ventricle: Left ventricular ejection fraction, by estimation, is 55 to 60%. The left ventricle has normal function. The left ventricle has no regional wall motion abnormalities. The left ventricular internal cavity size was normal in size. There is  no left ventricular hypertrophy. Left ventricular diastolic parameters were normal. Right Ventricle: The right ventricular size is normal. No increase in right ventricular wall thickness. Right ventricular systolic function is normal. Tricuspid regurgitation signal is inadequate for assessing PA pressure. Left Atrium: Left atrial size was normal in size. Right Atrium: Right atrial size was normal in size. Pericardium: There is no evidence of pericardial effusion. Mitral Valve: The mitral valve is normal in structure. There is mild thickening of the mitral valve leaflet(s). There is mild calcification of the mitral valve leaflet(s). Trivial mitral valve regurgitation. No evidence of mitral valve stenosis. Tricuspid Valve: The tricuspid valve is grossly normal. Tricuspid valve regurgitation is not demonstrated. No evidence of tricuspid stenosis. Aortic Valve: Sclerosis of right coronary cusp. The aortic valve is grossly normal. There is mild calcification of the aortic valve. There is mild thickening of the aortic valve. Aortic valve regurgitation is not visualized. Aortic valve sclerosis is  present, with no evidence of aortic valve stenosis. Pulmonic Valve: The pulmonic valve was not well visualized. Pulmonic valve regurgitation is not visualized. No evidence of pulmonic stenosis. Aorta: Aortic dilatation noted. There is mild dilatation of the ascending aorta, measuring 40 mm. Venous: The inferior vena cava is normal in size with greater than 50% respiratory variability, suggesting right atrial pressure of 3 mmHg. IAS/Shunts: No atrial level shunt detected by color flow Doppler.  LEFT VENTRICLE PLAX 2D LVIDd:         4.23 cm      Diastology LVIDs:         3.08 cm      LV e' medial:    6.53 cm/s LV PW:         0.98 cm      LV E/e' medial:  13.6 LV IVS:        0.98 cm      LV e' lateral:   8.27 cm/s LVOT diam:     2.10 cm      LV E/e' lateral: 10.7 LV SV:         92 LV SV Index:   42 LVOT Area:     3.46 cm                              3D Volume EF: LV Volumes (MOD)            3D EF:        58 % LV vol d, MOD A2C: 124.0 ml LV EDV:       143 ml LV vol d, MOD A4C: 143.0 ml LV ESV:       59 ml LV vol s, MOD A2C: 53.2 ml  LV SV:        83 ml LV vol s, MOD A4C: 66.5 ml LV SV MOD A2C:     70.8 ml LV SV MOD A4C:     143.0 ml LV SV MOD BP:      76.0 ml RIGHT VENTRICLE RV Basal diam:  4.30 cm RV Mid diam:  3.44 cm RV S prime:     12.90 cm/s TAPSE (M-mode): 2.6 cm LEFT ATRIUM             Index        RIGHT ATRIUM           Index LA diam:        4.50 cm 2.05 cm/m   RA Area:     13.60 cm LA Vol (A2C):   54.8 ml 25.01 ml/m  RA Volume:   30.90 ml  14.10 ml/m LA Vol (A4C):   48.2 ml 21.99 ml/m LA Biplane Vol: 52.3 ml 23.86 ml/m  AORTIC VALVE LVOT Vmax:   119.00 cm/s LVOT Vmean:  73.900 cm/s LVOT VTI:    0.265 m  AORTA Ao Root diam: 3.40 cm Ao Asc diam:  4.00 cm MITRAL VALVE MV Area (PHT): 3.53 cm    SHUNTS MV Decel Time: 215 msec    Systemic VTI:  0.26 m MV E velocity: 88.80 cm/s  Systemic Diam: 2.10 cm MV A velocity: 93.30 cm/s MV E/A ratio:  0.95 Jodelle Red MD Electronically signed by Jodelle Red MD Signature Date/Time: 07/28/2022/10:37:03 PM    Final     ASSESSMENT & PLAN [name] 77 y.o. 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  # Castrate Resistant Advanced Prostate Cancer # Metastatic Prostate Cancer to Lymph Nodes/Bone  --patient is s/p orchiectomy, no ADT therapy required --continue Xtandi 160 mg PO daily -- PSA <0.1 currently on target  -- last PSMA scan on 01/16/2022 showed response to therapy with no new progressive adenopathy or skeletal metastasis.  --Patient is requesting he receive his Xgeva shots monthly with monthly labs.  We will administer his shots monthly with every 2 month clinic visits. --patient has requested we skip his Xgeva shot today. --RTC in 2 months for continued evaluation and monitoring.   No orders of the defined types were placed in this encounter.   All questions were answered. The patient knows to call the clinic with any problems, questions or concerns.  A total of more than 30 minutes were spent on this encounter with face-to-face time and non-face-to-face time, including preparing to see the patient, ordering tests and/or medications, counseling the patient and coordination of care as outlined above.   Ulysees Barns, MD Department of Hematology/Oncology [facility] Phone: [phone] Pager: [phone] Email: [email or web address]  08/20/2022 3:14 PM

## 2022-09-15 ENCOUNTER — Other Ambulatory Visit: Payer: Medicare Other

## 2022-09-15 ENCOUNTER — Ambulatory Visit: Payer: Medicare Other

## 2022-09-16 ENCOUNTER — Other Ambulatory Visit: Payer: Self-pay

## 2022-09-16 ENCOUNTER — Inpatient Hospital Stay: Payer: Medicare Other | Attending: Oncology

## 2022-09-16 ENCOUNTER — Ambulatory Visit: Payer: Medicare Other

## 2022-09-16 DIAGNOSIS — C7951 Secondary malignant neoplasm of bone: Secondary | ICD-10-CM | POA: Insufficient documentation

## 2022-09-16 DIAGNOSIS — C61 Malignant neoplasm of prostate: Secondary | ICD-10-CM | POA: Diagnosis not present

## 2022-09-16 DIAGNOSIS — Z95828 Presence of other vascular implants and grafts: Secondary | ICD-10-CM

## 2022-09-16 DIAGNOSIS — C779 Secondary and unspecified malignant neoplasm of lymph node, unspecified: Secondary | ICD-10-CM | POA: Insufficient documentation

## 2022-09-16 LAB — CBC WITH DIFFERENTIAL (CANCER CENTER ONLY)
Abs Immature Granulocytes: 0.02 10*3/uL (ref 0.00–0.07)
Basophils Absolute: 0 10*3/uL (ref 0.0–0.1)
Basophils Relative: 0 %
Eosinophils Absolute: 0.1 10*3/uL (ref 0.0–0.5)
Eosinophils Relative: 1 %
HCT: 35.8 % — ABNORMAL LOW (ref 39.0–52.0)
Hemoglobin: 12 g/dL — ABNORMAL LOW (ref 13.0–17.0)
Immature Granulocytes: 0 %
Lymphocytes Relative: 25 %
Lymphs Abs: 1.4 10*3/uL (ref 0.7–4.0)
MCH: 32 pg (ref 26.0–34.0)
MCHC: 33.5 g/dL (ref 30.0–36.0)
MCV: 95.5 fL (ref 80.0–100.0)
Monocytes Absolute: 0.6 10*3/uL (ref 0.1–1.0)
Monocytes Relative: 10 %
Neutro Abs: 3.7 10*3/uL (ref 1.7–7.7)
Neutrophils Relative %: 64 %
Platelet Count: 225 10*3/uL (ref 150–400)
RBC: 3.75 MIL/uL — ABNORMAL LOW (ref 4.22–5.81)
RDW: 13.1 % (ref 11.5–15.5)
WBC Count: 5.8 10*3/uL (ref 4.0–10.5)
nRBC: 0 % (ref 0.0–0.2)

## 2022-09-16 LAB — CMP (CANCER CENTER ONLY)
ALT: 13 U/L (ref 0–44)
AST: 18 U/L (ref 15–41)
Albumin: 3.9 g/dL (ref 3.5–5.0)
Alkaline Phosphatase: 49 U/L (ref 38–126)
Anion gap: 7 (ref 5–15)
BUN: 27 mg/dL — ABNORMAL HIGH (ref 8–23)
CO2: 25 mmol/L (ref 22–32)
Calcium: 9.1 mg/dL (ref 8.9–10.3)
Chloride: 104 mmol/L (ref 98–111)
Creatinine: 1.18 mg/dL (ref 0.61–1.24)
GFR, Estimated: >60 mL/min (ref >60)
Glucose, Bld: 103 mg/dL — ABNORMAL HIGH (ref 70–99)
Potassium: 4.4 mmol/L (ref 3.5–5.1)
Sodium: 136 mmol/L (ref 135–145)
Total Bilirubin: 0.4 mg/dL (ref 0.3–1.2)
Total Protein: 6.5 g/dL (ref 6.5–8.1)

## 2022-09-16 MED ORDER — SODIUM CHLORIDE 0.9% FLUSH
10.0000 mL | Freq: Once | INTRAVENOUS | Status: AC
  Administered 2022-09-16: 10 mL

## 2022-09-16 MED ORDER — HEPARIN SOD (PORK) LOCK FLUSH 100 UNIT/ML IV SOLN
500.0000 [IU] | Freq: Once | INTRAVENOUS | Status: AC
  Administered 2022-09-16: 500 [IU]

## 2022-09-17 ENCOUNTER — Ambulatory Visit: Payer: Medicare Other

## 2022-09-17 VITALS — BP 151/91 | HR 76 | Temp 98.4°F | Resp 16

## 2022-09-17 DIAGNOSIS — C779 Secondary and unspecified malignant neoplasm of lymph node, unspecified: Secondary | ICD-10-CM | POA: Diagnosis not present

## 2022-09-17 DIAGNOSIS — C7951 Secondary malignant neoplasm of bone: Secondary | ICD-10-CM | POA: Diagnosis not present

## 2022-09-17 DIAGNOSIS — Z95828 Presence of other vascular implants and grafts: Secondary | ICD-10-CM

## 2022-09-17 DIAGNOSIS — C61 Malignant neoplasm of prostate: Secondary | ICD-10-CM

## 2022-09-17 MED ORDER — DENOSUMAB 120 MG/1.7ML ~~LOC~~ SOLN
120.0000 mg | Freq: Once | SUBCUTANEOUS | Status: AC
  Administered 2022-09-17: 120 mg via SUBCUTANEOUS
  Filled 2022-09-17: qty 1.7

## 2022-09-18 LAB — PROSTATE-SPECIFIC AG, SERUM (LABCORP): Prostate Specific Ag, Serum: <0.1 ng/mL (ref 0.0–4.0)

## 2022-09-22 ENCOUNTER — Telehealth: Payer: Self-pay | Admitting: *Deleted

## 2022-09-22 NOTE — Telephone Encounter (Signed)
TCT patient regarding recent lab results. Spoke with him and advised that his PSA remains undetectable. We will see him back in August 2024. Pt voiced understanding and is plased with these results. He is aware of his appts in August.

## 2022-09-22 NOTE — Telephone Encounter (Signed)
-----   Message from Ulysees Barns IV sent at 09/19/2022  2:05 PM EDT ----- Please let Mr. Bowerman know that his PSA remains undetectable. We will see him back in August 2024. ----- Message ----- From: Leory Plowman, Lab In [place] Sent: 09/16/2022   1:46 PM EDT To: Jaci Standard, MD

## 2022-09-24 ENCOUNTER — Ambulatory Visit: Payer: Medicare Other | Admitting: Internal Medicine

## 2022-09-25 ENCOUNTER — Telehealth: Payer: Self-pay | Admitting: Hematology and Oncology

## 2022-09-30 ENCOUNTER — Encounter: Payer: Self-pay | Admitting: Internal Medicine

## 2022-09-30 ENCOUNTER — Ambulatory Visit (INDEPENDENT_AMBULATORY_CARE_PROVIDER_SITE_OTHER): Payer: Medicare Other | Admitting: Internal Medicine

## 2022-09-30 VITALS — BP 128/78 | HR 74 | Temp 98.1°F | Ht 76.0 in | Wt 201.0 lb

## 2022-09-30 DIAGNOSIS — I10 Essential (primary) hypertension: Secondary | ICD-10-CM

## 2022-09-30 DIAGNOSIS — C61 Malignant neoplasm of prostate: Secondary | ICD-10-CM

## 2022-09-30 DIAGNOSIS — I700 Atherosclerosis of aorta: Secondary | ICD-10-CM

## 2022-09-30 DIAGNOSIS — Z23 Encounter for immunization: Secondary | ICD-10-CM

## 2022-09-30 DIAGNOSIS — D539 Nutritional anemia, unspecified: Secondary | ICD-10-CM

## 2022-09-30 DIAGNOSIS — E785 Hyperlipidemia, unspecified: Secondary | ICD-10-CM | POA: Diagnosis not present

## 2022-09-30 LAB — LIPID PANEL
Cholesterol: 253 mg/dL — ABNORMAL HIGH (ref 0–200)
HDL: 84.1 mg/dL
NonHDL: 169.17
Total CHOL/HDL Ratio: 3
Triglycerides: 313 mg/dL — ABNORMAL HIGH (ref 0.0–149.0)
VLDL: 62.6 mg/dL — ABNORMAL HIGH (ref 0.0–40.0)

## 2022-09-30 LAB — RETICULOCYTES
ABS Retic: 62240 cells/uL (ref 25000–90000)
Retic Ct Pct: 1.6 %

## 2022-09-30 LAB — IBC + FERRITIN
Ferritin: 152.1 ng/mL (ref 22.0–322.0)
Iron: 87 ug/dL (ref 42–165)
Saturation Ratios: 24.4 % (ref 20.0–50.0)
TIBC: 357 ug/dL (ref 250.0–450.0)
Transferrin: 255 mg/dL (ref 212.0–360.0)

## 2022-09-30 LAB — FOLATE: Folate: >23.8 ng/mL

## 2022-09-30 LAB — VITAMIN B12: Vitamin B-12: >1501 pg/mL — ABNORMAL HIGH (ref 211–911)

## 2022-09-30 LAB — LDL CHOLESTEROL, DIRECT: Direct LDL: 114 mg/dL

## 2022-09-30 LAB — PSA: PSA: 0.01 ng/mL — ABNORMAL LOW (ref 0.10–4.00)

## 2022-09-30 MED ORDER — OLMESARTAN MEDOXOMIL 20 MG PO TABS
20.0000 mg | ORAL_TABLET | Freq: Every day | ORAL | 1 refills | Status: DC
Start: 2022-09-30 — End: 2023-05-04

## 2022-09-30 MED ORDER — BOOSTRIX 5-2.5-18.5 LF-MCG/0.5 IM SUSP: 0.5000 mL | Freq: Once | INTRAMUSCULAR | 0 refills | Status: AC

## 2022-09-30 NOTE — Progress Notes (Signed)
Subjective:  Patient ID: Philip [name], male    DOB: 12/08/1945  Age: 77 y.o. MRN: [id number]  CC: Hypertension, Hyperlipidemia, and Anemia   HPI Philip [name] presents for f/up ---    Discussed the use of AI scribe software for clinical note transcription with the patient, who gave verbal consent to proceed.  History of Present Illness   The patient, with a history of anemia and prostate, reports an overall improvement in his health. He has been staying active, albeit limited by his knee pain. He denies experiencing any chest pain or shortness of breath during physical activity. He has been working on rebuilding strength in his legs, which he believes is progressing well.  The patient denies any symptoms from anemia and reports no blood loss. He has been taking a B12 supplement for the past 30 days on the advice of his oncologist. He reports occasional numbness in his fingers, which he attributes to chemotherapy he underwent in 2016.  He denies any symptoms of high blood pressure, such as dizziness or lightheadedness. He reports regular bowel movements and denies any blood in the stool, constipation, or diarrhea.  The patient has been taking Olmesartan for blood pressure, but due to a slightly low reading, the dose is being adjusted. He has also been on cholesterol medication, which he reports is not causing any muscle or joint aches. The patient has not been vaccinated against shingles and expresses mixed feelings about receiving the vaccine. He is, however, open to receiving a tetanus booster if needed.       Outpatient Medications Prior to Visit  Medication Sig Dispense Refill   aspirin EC 81 MG tablet Take 81 mg by mouth daily. Swallow whole.     atorvastatin (LIPITOR) 10 MG tablet TAKE 1 TABLET BY MOUTH EVERY DAY 90 tablet 1   BIOTIN 5000 PO Take 1 tablet by mouth daily.     CALCIUM-VITAMIN D PO Take 1 tablet by mouth daily.     Denosumab (XGEVA [place]) Inject into the skin. Every 6-8  weeks.     enzalutamide (XTANDI) 40 MG tablet Take 4 tablets (160mg ) by mouth once daily as directed by physician. 120 tablet 6   ferrous sulfate 325 (65 FE) MG EC tablet TAKE 1 TABLET BY MOUTH EVERY DAY WITH BREAKFAST 90 tablet 1   fexofenadine (ALLEGRA) 180 MG tablet Take 180 mg by mouth daily.     hydrOXYzine (ATARAX/VISTARIL) 10 MG tablet Take 10 mg by mouth daily.     KLOR-CON M20 20 MEQ tablet TAKE 1 TABLET BY MOUTH TWICE A DAY 180 tablet 0   lidocaine-prilocaine (EMLA) cream Apply 1 Application topically as needed (access port). 30 g 0   LORazepam (ATIVAN) 1 MG tablet TAKE 1 TABLET BY MOUTH EVERY 8 HOURS AS NEEDED FOR ANXIETY 90 tablet 0   Multiple Vitamin (MULTIVITAMIN) tablet Take 1 tablet by mouth daily.     omeprazole (PRILOSEC) 20 MG capsule TAKE 1 CAPSULE BY MOUTH DAILY AS NEEDED. 90 capsule 1   olmesartan (BENICAR) 40 MG tablet TAKE 1 TABLET BY MOUTH EVERY DAY 90 tablet 0   No facility-administered medications prior to visit.    ROS Review of Systems  Constitutional: Negative.  Negative for appetite change, diaphoresis, fatigue and unexpected weight change.  HENT: Negative.    Eyes: Negative.   Respiratory: Negative.  Negative for cough, chest tightness, shortness of breath and wheezing.   Cardiovascular:  Negative for chest pain, palpitations and leg swelling.  Gastrointestinal: Negative.  Negative for abdominal pain, constipation, diarrhea, nausea and vomiting.  Endocrine: Negative.   Genitourinary:  Negative for difficulty urinating and dysuria.  Musculoskeletal: Negative.  Negative for arthralgias, joint swelling and myalgias.  Skin: Negative.   Neurological: Negative.  Negative for dizziness, weakness, light-headedness and headaches.  Hematological:  Negative for adenopathy. Does not bruise/bleed easily.  Psychiatric/Behavioral: Negative.      Objective:  BP 128/78 (BP Location: Left Arm, Patient Position: Sitting, Cuff Size: Large)   Pulse 74   Temp 98.1 F  (36.7 C) (Oral)   Ht 6\' 4"  (1.93 m)   Wt 201 lb (91.2 kg)   SpO2 97%   BMI 24.47 kg/m   BP Readings from Last 3 Encounters:  09/30/22 128/78  09/17/22 (!) 151/91  08/20/22 137/89    Wt Readings from Last 3 Encounters:  09/30/22 201 lb (91.2 kg)  08/20/22 197 lb 9.6 oz (89.6 kg)  06/25/22 195 lb (88.5 kg)    Physical Exam Vitals reviewed.  Constitutional:      Appearance: Normal appearance.  HENT:     Mouth/Throat:     Mouth: Mucous membranes are moist.  Eyes:     General: No scleral icterus.    Conjunctiva/sclera: Conjunctivae normal.  Cardiovascular:     Rate and Rhythm: Normal rate and regular rhythm.     Heart sounds: No murmur heard.    No friction rub. No gallop.  Pulmonary:     Effort: Pulmonary effort is normal.     Breath sounds: No stridor. No wheezing, rhonchi or rales.  Abdominal:     General: Abdomen is flat.     Palpations: There is no mass.     Tenderness: There is no abdominal tenderness. There is no guarding.     Hernia: No hernia is present.  Musculoskeletal:        General: Normal range of motion.     Cervical back: Neck supple.     Right lower leg: No edema.     Left lower leg: No edema.  Lymphadenopathy:     Cervical: No cervical adenopathy.  Skin:    General: Skin is warm and dry.  Neurological:     General: No focal deficit present.     Mental Status: He is alert. Mental status is at baseline.  Psychiatric:        Mood and Affect: Mood normal.        Behavior: Behavior normal.     Lab Results  Component Value Date   WBC 5.8 09/16/2022   HGB 12.0 (L) 09/16/2022   HCT 35.8 (L) 09/16/2022   PLT 225 09/16/2022   GLUCOSE 103 (H) 09/16/2022   CHOL 253 (H) 09/30/2022   TRIG 313.0 (H) 09/30/2022   HDL 84.10 09/30/2022   LDLDIRECT 114.0 09/30/2022   ALT 13 09/16/2022   AST 18 09/16/2022   NA 136 09/16/2022   K 4.4 09/16/2022   CL 104 09/16/2022   CREATININE 1.18 09/16/2022   BUN 27 (H) 09/16/2022   CO2 25 09/16/2022   TSH  1.94 06/25/2022   PSA 0.01 (L) 09/30/2022   INR 1.06 02/09/2015    NM PET (PSMA) SKULL TO MID THIGH  Result Date: 01/17/2022 CLINICAL DATA:  Prostate carcinoma .  PSA less than 0.1. EXAM: NUCLEAR MEDICINE PET SKULL BASE TO THIGH TECHNIQUE: 8.8 mCi F18 Piflufolastat (Pylarify) was injected intravenously. Full-ring PET imaging was performed from the skull base to thigh after the radiotracer. CT data was obtained and used for  attenuation correction and anatomic localization. COMPARISON:  PSMA PET scan 12 1 22  FINDINGS: NECK No radiotracer activity in neck lymph nodes. Incidental CT finding: None. CHEST No radiotracer accumulation within mediastinal or hilar lymph nodes. No suspicious pulmonary nodules on the CT scan. Incidental CT finding: Port in the anterior chest wall with tip in distal SVC. ABDOMEN/PELVIS Prostate: No focal radiotracer activity in the prostate gland. Lymph nodes: Mild radiotracer activity associated with the small LEFT external iliac lymph node along the operator space measuring 5 mm short axis with SUV max equal 3.7 compared SUV max equal 5.5 on comparison exam. (Image 206). The more peripheral LEFT external iliac node measures 5 mm (image 203/4) with SUV max equal 2.5 compared SUV max equal 3.9. Previously described RIGHT operator node has very low radiotracer activity (SUV max 1.23) compared SUV max equal 2.7. Current activity is not above background. No evidence radiotracer avid or enlarged lymph nodes outside the pelvis Liver: No evidence of liver metastasis. Several low-density lesion liver unchanged from prior without radiotracer activity Incidental CT finding: None. SKELETON No focal activity to suggest skeletal metastasis. IMPRESSION: 1. Previous described small iliac pelvic lymph nodes have all decreased in radiotracer activity compared to PSMA PET scan 01/31/2021 and are unchanged in size. The now mild radiotracer activity favors reactive adenopathy over metastatic adenopathy. 2.  No new progressive adenopathy in the abdomen pelvis. 3. No visceral metastasis or skeletal metastasis. Electronically Signed   By: Genevive Bi M.D.   On: 01/17/2022 13:56    Assessment & Plan:   Atherosclerosis of aorta (HCC)- Risk factor modifications addressed. -     Lipid panel; Future  Hyperlipidemia LDL goal <100 -     Lipid panel; Future  Essential hypertension - His blood pressure is well-controlled. -     Olmesartan Medoxomil; Take 1 tablet (20 mg total) by mouth daily.  Dispense: 90 tablet; Refill: 1  Deficiency anemia- Will evaluate for vitamin deficiencies. -     IBC + Ferritin; Future -     Vitamin B1; Future -     Zinc; Future -     Folate; Future -     Vitamin B12; Future -     Reticulocytes; Future  Need for prophylactic vaccination with combined diphtheria-tetanus-pertussis (DTP) vaccine -     Boostrix; Inject 0.5 mLs into the muscle once for 1 dose.  Dispense: 0.5 mL; Refill: 0  Prostate cancer (HCC)- PSA is very low. -     PSA; Future  Other orders -     LDL cholesterol, direct     Follow-up: Return in about 6 months (around 04/02/2023).  Sanda Linger, MD

## 2022-09-30 NOTE — Patient Instructions (Signed)

## 2022-10-08 ENCOUNTER — Other Ambulatory Visit: Payer: Self-pay | Admitting: Internal Medicine

## 2022-10-08 DIAGNOSIS — D538 Other specified nutritional anemias: Secondary | ICD-10-CM | POA: Insufficient documentation

## 2022-10-08 DIAGNOSIS — E60 Dietary zinc deficiency: Secondary | ICD-10-CM | POA: Insufficient documentation

## 2022-10-08 MED ORDER — ZINC GLUCONATE 50 MG PO TABS
50.0000 mg | ORAL_TABLET | Freq: Every day | ORAL | 1 refills | Status: DC
Start: 2022-10-08 — End: 2023-03-26

## 2022-10-09 ENCOUNTER — Encounter: Payer: Self-pay | Admitting: Internal Medicine

## 2022-10-14 ENCOUNTER — Inpatient Hospital Stay: Payer: Medicare Other | Attending: Oncology

## 2022-10-14 DIAGNOSIS — C779 Secondary and unspecified malignant neoplasm of lymph node, unspecified: Secondary | ICD-10-CM | POA: Diagnosis not present

## 2022-10-14 DIAGNOSIS — C7951 Secondary malignant neoplasm of bone: Secondary | ICD-10-CM | POA: Diagnosis not present

## 2022-10-14 DIAGNOSIS — C61 Malignant neoplasm of prostate: Secondary | ICD-10-CM | POA: Diagnosis not present

## 2022-10-14 DIAGNOSIS — R7989 Other specified abnormal findings of blood chemistry: Secondary | ICD-10-CM | POA: Diagnosis not present

## 2022-10-14 DIAGNOSIS — Z95828 Presence of other vascular implants and grafts: Secondary | ICD-10-CM

## 2022-10-14 LAB — CMP (CANCER CENTER ONLY)
ALT: 13 U/L (ref 0–44)
AST: 18 U/L (ref 15–41)
Albumin: 4.2 g/dL (ref 3.5–5.0)
Alkaline Phosphatase: 55 U/L (ref 38–126)
Anion gap: 7 (ref 5–15)
BUN: 23 mg/dL (ref 8–23)
CO2: 27 mmol/L (ref 22–32)
Calcium: 9.4 mg/dL (ref 8.9–10.3)
Chloride: 102 mmol/L (ref 98–111)
Creatinine: 1.28 mg/dL — ABNORMAL HIGH (ref 0.61–1.24)
GFR, Estimated: 57 mL/min — ABNORMAL LOW (ref >60)
Glucose, Bld: 108 mg/dL — ABNORMAL HIGH (ref 70–99)
Potassium: 4.9 mmol/L (ref 3.5–5.1)
Sodium: 136 mmol/L (ref 135–145)
Total Bilirubin: 0.5 mg/dL (ref 0.3–1.2)
Total Protein: 6.9 g/dL (ref 6.5–8.1)

## 2022-10-14 LAB — CBC WITH DIFFERENTIAL (CANCER CENTER ONLY)
Abs Immature Granulocytes: 0.03 10*3/uL (ref 0.00–0.07)
Basophils Absolute: 0 10*3/uL (ref 0.0–0.1)
Basophils Relative: 0 %
Eosinophils Absolute: 0.1 10*3/uL (ref 0.0–0.5)
Eosinophils Relative: 1 %
HCT: 36.3 % — ABNORMAL LOW (ref 39.0–52.0)
Hemoglobin: 12.6 g/dL — ABNORMAL LOW (ref 13.0–17.0)
Immature Granulocytes: 1 %
Lymphocytes Relative: 26 %
Lymphs Abs: 1.5 10*3/uL (ref 0.7–4.0)
MCH: 32.8 pg (ref 26.0–34.0)
MCHC: 34.7 g/dL (ref 30.0–36.0)
MCV: 94.5 fL (ref 80.0–100.0)
Monocytes Absolute: 0.6 10*3/uL (ref 0.1–1.0)
Monocytes Relative: 11 %
Neutro Abs: 3.6 10*3/uL (ref 1.7–7.7)
Neutrophils Relative %: 61 %
Platelet Count: 230 10*3/uL (ref 150–400)
RBC: 3.84 MIL/uL — ABNORMAL LOW (ref 4.22–5.81)
RDW: 12.4 % (ref 11.5–15.5)
WBC Count: 5.9 10*3/uL (ref 4.0–10.5)
nRBC: 0 % (ref 0.0–0.2)

## 2022-10-14 MED ORDER — HEPARIN SOD (PORK) LOCK FLUSH 100 UNIT/ML IV SOLN
500.0000 [IU] | Freq: Once | INTRAVENOUS | Status: AC
  Administered 2022-10-14: 500 [IU]

## 2022-10-14 MED ORDER — SODIUM CHLORIDE 0.9% FLUSH
10.0000 mL | Freq: Once | INTRAVENOUS | Status: AC
  Administered 2022-10-14: 10 mL

## 2022-10-15 ENCOUNTER — Other Ambulatory Visit: Payer: Self-pay

## 2022-10-15 ENCOUNTER — Inpatient Hospital Stay (HOSPITAL_BASED_OUTPATIENT_CLINIC_OR_DEPARTMENT_OTHER): Payer: Medicare Other | Admitting: Physician Assistant

## 2022-10-15 ENCOUNTER — Inpatient Hospital Stay: Payer: Medicare Other

## 2022-10-15 ENCOUNTER — Ambulatory Visit: Payer: Medicare Other | Admitting: Hematology and Oncology

## 2022-10-15 ENCOUNTER — Ambulatory Visit: Payer: Medicare Other

## 2022-10-15 VITALS — BP 150/90 | HR 87 | Temp 98.8°F | Resp 16

## 2022-10-15 VITALS — BP 147/83 | HR 81 | Temp 98.7°F | Wt 201.5 lb

## 2022-10-15 DIAGNOSIS — C61 Malignant neoplasm of prostate: Secondary | ICD-10-CM

## 2022-10-15 DIAGNOSIS — R7989 Other specified abnormal findings of blood chemistry: Secondary | ICD-10-CM | POA: Diagnosis not present

## 2022-10-15 DIAGNOSIS — C779 Secondary and unspecified malignant neoplasm of lymph node, unspecified: Secondary | ICD-10-CM | POA: Diagnosis not present

## 2022-10-15 DIAGNOSIS — C7951 Secondary malignant neoplasm of bone: Secondary | ICD-10-CM | POA: Diagnosis not present

## 2022-10-15 DIAGNOSIS — Z95828 Presence of other vascular implants and grafts: Secondary | ICD-10-CM

## 2022-10-15 MED ORDER — DENOSUMAB 120 MG/1.7ML ~~LOC~~ SOLN
120.0000 mg | Freq: Once | SUBCUTANEOUS | Status: AC
  Administered 2022-10-15: 120 mg via SUBCUTANEOUS
  Filled 2022-10-15: qty 1.7

## 2022-10-15 NOTE — Progress Notes (Signed)
[facility] [phone]   [phone]  PROGRESS NOTE  Patient Care Team: Etta Grandchild, MD as PCP - General (Internal Medicine) Hurshel Party, OD as Consulting Physician (Optometry) Berneice Heinrich, Delbert Phenix., MD as Consulting Physician (Urology) Benjiman Core, MD (Inactive) as Consulting Physician (Oncology)  Hematological/Oncological History # Castrate Resistant Advanced Prostate Cancer # Metastatic Prostate Cancer to Lymph Nodes/Bone  01/2015: orchiectomy performed 02/16/2015: Taxotere chemotherapy at 75 mg/m. Continued until 05/2015 09/2015: start of zytiga. Stopped in 2023 due to insurance issues 04/2021: Xtandi 160 mg daily  03/19/2022: last visit with Dr. Clelia Croft 05/20/2022: transition care to Dr. Leonides Schanz   Interval History:  [name] 77 y.o. 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 The patient's last visit was on 08/20/2022. In the interim since the last visit he has continued on Xtandi therapy.   On exam today Mr. Lupo reports his energy and appetite are overall stable. He is able to complete all his ADLs on his own. He denies nausea, vomiting or bowel habit changes. He denies any overt signs of bleeding including hematochezia or melena. He does have some occasional episode of ankle edema that improves with elevation. He denies fevers, chills, sweats, shortness of breath, chest pain or cough. He has no other complaints.  A full 10 point ROS was otherwise negative.  MEDICAL HISTORY:  Past Medical History:  Diagnosis Date   Atherosclerosis of aorta (HCC) 03/11/2017   The 10-year ASCVD risk score Denman George DC Jr., et al., 2013) is: 24.6%   Values used to calculate the score:     Age: 67 years     Sex: Male     Is Non-Hispanic African American: No     Diabetic: No     Tobacco smoker: No     Systolic Blood Pressure: 154 mmHg      Is BP treated: No     HDL Cholesterol: 71.1 mg/dL     Total Cholesterol: 212 mg/dL   Cancer (HCC)    PROSTATE   Chronic idiopathic constipation 03/11/2017   Edema leg Sept 28, 2016   left leg from foot to thigh, increasinly worse over last 4 weeks   Essential hypertension 03/30/2018   Gastroesophageal reflux disease without esophagitis 03/11/2017   Hyperlipidemia LDL goal <100 03/16/2017   The 10-year ASCVD risk score Denman George DC Jr., et al., 2013) is: 30.6%   Values used to calculate the score:     Age: 66 years     Sex: Male     Is Non-Hispanic African American: No     Diabetic: No     Tobacco smoker: No     Systolic Blood Pressure: 148 mmHg     Is BP treated: Yes     HDL Cholesterol: 69.3 mg/dL     Total Cholesterol: 211 mg/dL   Hypertension    Hypoglycemia    Swelling LAST 30 DAYS DR [facility] AWARE   LEFT LEG AND FOOT    SURGICAL HISTORY: Past Surgical History:  Procedure Laterality Date   BOWEL DECOMPRESSION N/A 03/07/2021   Procedure: BOWEL DECOMPRESSION;  Surgeon: Benancio Deeds, MD;  Location: [facility];  Service: Gastroenterology;  Laterality: N/A;   BOWEL DECOMPRESSION N/A 06/07/2021   Procedure: BOWEL DECOMPRESSION;  Surgeon: Jeani Hawking, MD;  Location: [facility];  Service: Gastroenterology;  Laterality: N/A;   FLEXIBLE SIGMOIDOSCOPY N/A 03/07/2021  Procedure: FLEXIBLE SIGMOIDOSCOPY;  Surgeon: Benancio Deeds, MD;  Location: [facility];  Service: Gastroenterology;  Laterality: N/A;   FLEXIBLE SIGMOIDOSCOPY N/A 06/07/2021   Procedure: FLEXIBLE SIGMOIDOSCOPY;  Surgeon: Jeani Hawking, MD;  Location: [facility];  Service: Gastroenterology;  Laterality: N/A;   GUM SURGERY     TEETH IMPLANTS ALSO   LAPAROSCOPIC SIGMOID COLECTOMY N/A 06/10/2021   Procedure: LAPAROSCOPIC ASSISTED SIGMOID COLECTOMY;  Surgeon: Luretha Murphy, MD;  Location: [facility];  Service: General;  Laterality: N/A;   ORCHIECTOMY Bilateral 01/03/2015   Procedure: ORCHIECTOMY;  Surgeon: Sebastian Ache, MD;   Location: [facility];  Service: Urology;  Laterality: Bilateral;    SOCIAL HISTORY: Social History   Socioeconomic History   Marital status: Married    Spouse name: Not on file   Number of children: Not on file   Years of education: Not on file   Highest education level: Bachelor's degree (e.g., BA, AB, BS)  Occupational History   Not on file  Tobacco Use   Smoking status: Never   Smokeless tobacco: Never  Vaping Use   Vaping status: Never Used  Substance and Sexual Activity   Alcohol use: Yes    Alcohol/week: 1.0 standard drink of alcohol    Types: 1 Glasses of wine per week    Comment: occasional wine with dinner   Drug use: No   Sexual activity: Not on file  Other Topics Concern   Not on file  Social History Narrative   Not on file   Social Determinants of Health   Financial Resource Strain: Low Risk  (06/21/2022)   Overall Financial Resource Strain (CARDIA)    Difficulty of Paying Living Expenses: Not hard at all  Food Insecurity: No Food Insecurity (06/21/2022)   Hunger Vital Sign    Worried About Running Out of Food in the Last Year: Never true    Ran Out of Food in the Last Year: Never true  Transportation Needs: No Transportation Needs (06/21/2022)   PRAPARE - Administrator, Civil Service (Medical): No    Lack of Transportation (Non-Medical): No  Physical Activity: Sufficiently Active (06/21/2022)   Exercise Vital Sign    Days of Exercise per Week: 7 days    Minutes of Exercise per Session: 30 min  Stress: Stress Concern Present (06/21/2022)   [employer] of Occupational Health - Occupational Stress Questionnaire    Feeling of Stress : To some extent  Social Connections: Socially Isolated (06/21/2022)   Social Connection and Isolation Panel [NHANES]    Frequency of Communication with Friends and Family: Once a week    Frequency of Social Gatherings with Friends and Family: Never    Attends Religious Services: Never    Database administrator or  Organizations: No    Attends Banker Meetings: Never    Marital Status: Married  Catering manager Violence: Not At Risk (12/04/2021)   Humiliation, Afraid, Rape, and Kick questionnaire    Fear of Current or Ex-Partner: No    Emotionally Abused: No    Physically Abused: No    Sexually Abused: No    FAMILY HISTORY: Family History  Problem Relation Age of Onset   CVA Mother    Lymphoma Mother    Pneumonia Mother    CAD Father    Osteoarthritis Brother    Colon cancer Neg Hx     ALLERGIES:  is allergic to prednisone.  MEDICATIONS:  Current Outpatient Medications  Medication Sig Dispense Refill  aspirin EC 81 MG tablet Take 81 mg by mouth daily. Swallow whole.     atorvastatin (LIPITOR) 10 MG tablet TAKE 1 TABLET BY MOUTH EVERY DAY 90 tablet 1   BIOTIN 5000 PO Take 1 tablet by mouth daily.     CALCIUM-VITAMIN D PO Take 1 tablet by mouth daily.     Denosumab (XGEVA [place]) Inject into the skin. Every 6-8 weeks.     enzalutamide (XTANDI) 40 MG tablet Take 4 tablets (160mg ) by mouth once daily as directed by physician. 120 tablet 6   ferrous sulfate 325 (65 FE) MG EC tablet TAKE 1 TABLET BY MOUTH EVERY DAY WITH BREAKFAST 90 tablet 1   fexofenadine (ALLEGRA) 180 MG tablet Take 180 mg by mouth daily.     hydrOXYzine (ATARAX/VISTARIL) 10 MG tablet Take 10 mg by mouth daily.     KLOR-CON M20 20 MEQ tablet TAKE 1 TABLET BY MOUTH TWICE A DAY 180 tablet 0   lidocaine-prilocaine (EMLA) cream Apply 1 Application topically as needed (access port). 30 g 0   LORazepam (ATIVAN) 1 MG tablet TAKE 1 TABLET BY MOUTH EVERY 8 HOURS AS NEEDED FOR ANXIETY 90 tablet 0   Multiple Vitamin (MULTIVITAMIN) tablet Take 1 tablet by mouth daily.     olmesartan (BENICAR) 20 MG tablet Take 1 tablet (20 mg total) by mouth daily. 90 tablet 1   omeprazole (PRILOSEC) 20 MG capsule TAKE 1 CAPSULE BY MOUTH DAILY AS NEEDED. 90 capsule 1   zinc gluconate 50 MG tablet Take 1 tablet (50 mg total) by mouth daily.  (Patient not taking: Reported on 10/15/2022) 90 tablet 1   No current facility-administered medications for this visit.    REVIEW OF SYSTEMS:   Constitutional: ( - ) fevers, ( - )  chills , ( - ) night sweats Eyes: ( - ) blurriness of vision, ( - ) double vision, ( - ) watery eyes Ears, nose, mouth, throat, and face: ( - ) mucositis, ( - ) sore throat Respiratory: ( - ) cough, ( - ) dyspnea, ( - ) wheezes Cardiovascular: ( - ) palpitation, ( - ) chest discomfort, ( - ) lower extremity swelling Gastrointestinal:  ( - ) nausea, ( - ) heartburn, ( - ) change in bowel habits Skin: ( - ) abnormal skin rashes Lymphatics: ( - ) new lymphadenopathy, ( - ) easy bruising Neurological: ( - ) numbness, ( - ) tingling, ( - ) new weaknesses Behavioral/Psych: ( - ) mood change, ( - ) new changes  All other systems were reviewed with the patient and are negative.  PHYSICAL EXAMINATION: ECOG PERFORMANCE STATUS: 0 - Asymptomatic  Vitals:   10/15/22 1450  BP: (!) 147/83  Pulse: 81  Temp: 98.7 F (37.1 C)  SpO2: 100%    Filed Weights   10/15/22 1450  Weight: 201 lb 8 oz (91.4 kg)     GENERAL: Well-appearing elderly Caucasian male, alert, no distress and comfortable SKIN: skin color, texture, turgor are normal, no rashes or significant lesions EYES: conjunctiva are pink and non-injected, sclera clear LUNGS: clear to auscultation and percussion with normal breathing effort HEART: regular rate & rhythm and no murmurs and no lower extremity edema Musculoskeletal: no cyanosis of digits and no clubbing  PSYCH: alert & oriented x 3, fluent speech NEURO: no focal motor/sensory deficits  LABORATORY DATA:  I have reviewed the data as listed    Latest Ref Rng & Units 10/14/2022    1:25 PM 09/16/2022  1:30 PM 08/19/2022    1:23 PM  CBC  WBC 4.0 - 10.5 K/uL 5.9  5.8  5.9   Hemoglobin 13.0 - 17.0 g/dL 16.1  09.6  04.5   Hematocrit 39.0 - 52.0 % 36.3  35.8  36.6   Platelets 150 - 400 K/uL 230  225   236        Latest Ref Rng & Units 10/14/2022    1:25 PM 09/16/2022    1:28 PM 08/19/2022    1:23 PM  CMP  Glucose 70 - 99 mg/dL 409  811  914   BUN 8 - 23 mg/dL 23  27  27    Creatinine 0.61 - 1.24 mg/dL 7.82  9.56  2.13   Sodium 135 - 145 mmol/L 136  136  138   Potassium 3.5 - 5.1 mmol/L 4.9  4.4  4.3   Chloride 98 - 111 mmol/L 102  104  106   CO2 22 - 32 mmol/L 27  25  25    Calcium 8.9 - 10.3 mg/dL 9.4  9.1  9.7   Total Protein 6.5 - 8.1 g/dL 6.9  6.5  6.3   Total Bilirubin 0.3 - 1.2 mg/dL 0.5  0.4  0.5   Alkaline Phos 38 - 126 U/L 55  49  44   AST 15 - 41 U/L 18  18  16    ALT 0 - 44 U/L 13  13  12      RADIOGRAPHIC STUDIES: No results found.  ASSESSMENT & PLAN Leniel Friedberg is a 77 y.o. 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  # Castrate Resistant Advanced Prostate Cancer # Metastatic Prostate Cancer to Lymph Nodes/Bone  --patient is s/p orchiectomy, no ADT therapy required --labs from yesterday reviewed. CBC shows no cytopenias. CMP shows mild elevation of creatinine levels, encouraged increased PO hydration. LFTs normal.  --Most recent PSA level from 09/16/2022 was <0.1.  --continue Xtandi 160 mg PO daily -- last PSMA scan on 01/16/2022 showed response to therapy with no new progressive adenopathy or skeletal metastasis.  --Continue with monthly Xgeva shots with labs. RTC in 2 months for toxicity check.   No orders of the defined types were placed in this encounter.   All questions were answered. The patient knows to call the clinic with any problems, questions or concerns.  I have spent a total of 25 minutes minutes of face-to-face and non-face-to-face time, preparing to see the patient,  performing a medically appropriate examination, counseling and educating the patient, documenting clinical information in the electronic health record,  independently interpreting results and communicating results to the patient, and care coordination.   Georga Kaufmann PA-C Dept of Hematology and Oncology [facility] Phone: [phone]   10/15/2022 4:33 PM

## 2022-10-16 ENCOUNTER — Encounter: Payer: Self-pay | Admitting: Physician Assistant

## 2022-10-17 ENCOUNTER — Encounter: Payer: Self-pay | Admitting: Hematology and Oncology

## 2022-10-26 ENCOUNTER — Other Ambulatory Visit: Payer: Self-pay | Admitting: Internal Medicine

## 2022-10-26 DIAGNOSIS — I10 Essential (primary) hypertension: Secondary | ICD-10-CM

## 2022-10-26 DIAGNOSIS — T502X5A Adverse effect of carbonic-anhydrase inhibitors, benzothiadiazides and other diuretics, initial encounter: Secondary | ICD-10-CM

## 2022-11-05 ENCOUNTER — Other Ambulatory Visit: Payer: Self-pay | Admitting: Internal Medicine

## 2022-11-05 DIAGNOSIS — I10 Essential (primary) hypertension: Secondary | ICD-10-CM

## 2022-11-15 ENCOUNTER — Other Ambulatory Visit: Payer: Self-pay | Admitting: Internal Medicine

## 2022-11-15 DIAGNOSIS — D509 Iron deficiency anemia, unspecified: Secondary | ICD-10-CM

## 2022-11-18 ENCOUNTER — Inpatient Hospital Stay: Payer: Medicare Other | Attending: Oncology

## 2022-11-18 ENCOUNTER — Inpatient Hospital Stay: Payer: Medicare Other

## 2022-11-18 VITALS — BP 160/94 | HR 74 | Temp 97.8°F | Resp 18

## 2022-11-18 DIAGNOSIS — C61 Malignant neoplasm of prostate: Secondary | ICD-10-CM | POA: Insufficient documentation

## 2022-11-18 DIAGNOSIS — C7951 Secondary malignant neoplasm of bone: Secondary | ICD-10-CM | POA: Insufficient documentation

## 2022-11-18 DIAGNOSIS — C779 Secondary and unspecified malignant neoplasm of lymph node, unspecified: Secondary | ICD-10-CM | POA: Insufficient documentation

## 2022-11-18 DIAGNOSIS — Z95828 Presence of other vascular implants and grafts: Secondary | ICD-10-CM

## 2022-11-18 LAB — CBC WITH DIFFERENTIAL (CANCER CENTER ONLY)
Abs Immature Granulocytes: 0.04 10*3/uL (ref 0.00–0.07)
Basophils Absolute: 0 10*3/uL (ref 0.0–0.1)
Basophils Relative: 0 %
Eosinophils Absolute: 0.1 10*3/uL (ref 0.0–0.5)
Eosinophils Relative: 1 %
HCT: 36 % — ABNORMAL LOW (ref 39.0–52.0)
Hemoglobin: 12 g/dL — ABNORMAL LOW (ref 13.0–17.0)
Immature Granulocytes: 1 %
Lymphocytes Relative: 29 %
Lymphs Abs: 1.5 10*3/uL (ref 0.7–4.0)
MCH: 32 pg (ref 26.0–34.0)
MCHC: 33.3 g/dL (ref 30.0–36.0)
MCV: 96 fL (ref 80.0–100.0)
Monocytes Absolute: 0.3 10*3/uL (ref 0.1–1.0)
Monocytes Relative: 6 %
Neutro Abs: 3.3 10*3/uL (ref 1.7–7.7)
Neutrophils Relative %: 63 %
Platelet Count: 240 10*3/uL (ref 150–400)
RBC: 3.75 MIL/uL — ABNORMAL LOW (ref 4.22–5.81)
RDW: 12.3 % (ref 11.5–15.5)
WBC Count: 5.3 10*3/uL (ref 4.0–10.5)
nRBC: 0 % (ref 0.0–0.2)

## 2022-11-18 LAB — CMP (CANCER CENTER ONLY)
ALT: 11 U/L (ref 0–44)
AST: 16 U/L (ref 15–41)
Albumin: 4 g/dL (ref 3.5–5.0)
Alkaline Phosphatase: 52 U/L (ref 38–126)
Anion gap: 7 (ref 5–15)
BUN: 19 mg/dL (ref 8–23)
CO2: 27 mmol/L (ref 22–32)
Calcium: 9.3 mg/dL (ref 8.9–10.3)
Chloride: 104 mmol/L (ref 98–111)
Creatinine: 1.13 mg/dL (ref 0.61–1.24)
GFR, Estimated: >60 mL/min (ref >60)
Glucose, Bld: 129 mg/dL — ABNORMAL HIGH (ref 70–99)
Potassium: 4.5 mmol/L (ref 3.5–5.1)
Sodium: 138 mmol/L (ref 135–145)
Total Bilirubin: 0.4 mg/dL (ref 0.3–1.2)
Total Protein: 6.7 g/dL (ref 6.5–8.1)

## 2022-11-18 MED ORDER — SODIUM CHLORIDE 0.9% FLUSH: 10.0000 mL | Freq: Once | INTRAVENOUS | Status: DC

## 2022-11-18 MED ORDER — HEPARIN SOD (PORK) LOCK FLUSH 100 UNIT/ML IV SOLN: 500.0000 [IU] | Freq: Once | INTRAVENOUS | Status: DC

## 2022-11-19 ENCOUNTER — Inpatient Hospital Stay: Payer: Medicare Other

## 2022-11-19 VITALS — BP 143/87 | HR 72 | Temp 98.5°F | Resp 18

## 2022-11-19 DIAGNOSIS — C61 Malignant neoplasm of prostate: Secondary | ICD-10-CM | POA: Diagnosis not present

## 2022-11-19 DIAGNOSIS — C7951 Secondary malignant neoplasm of bone: Secondary | ICD-10-CM | POA: Diagnosis not present

## 2022-11-19 DIAGNOSIS — Z95828 Presence of other vascular implants and grafts: Secondary | ICD-10-CM

## 2022-11-19 DIAGNOSIS — C779 Secondary and unspecified malignant neoplasm of lymph node, unspecified: Secondary | ICD-10-CM | POA: Diagnosis not present

## 2022-11-19 MED ORDER — DENOSUMAB 120 MG/1.7ML ~~LOC~~ SOLN
120.0000 mg | Freq: Once | SUBCUTANEOUS | Status: AC
  Administered 2022-11-19: 120 mg via SUBCUTANEOUS
  Filled 2022-11-19: qty 1.7

## 2022-11-24 ENCOUNTER — Telehealth: Payer: Self-pay | Admitting: Hematology and Oncology

## 2022-12-10 IMAGING — CR DG ABDOMEN ACUTE W/ 1V CHEST
4 series · 4 of 4 positions shown · non-contrast
Comparison: KUB and CT abdomen/pelvis 03/07/2021

CLINICAL DATA: Abdominal pain and nausea, history of volvulus

EXAM:
DG ABDOMEN ACUTE WITH 1 VIEW CHEST

[w chest pa]
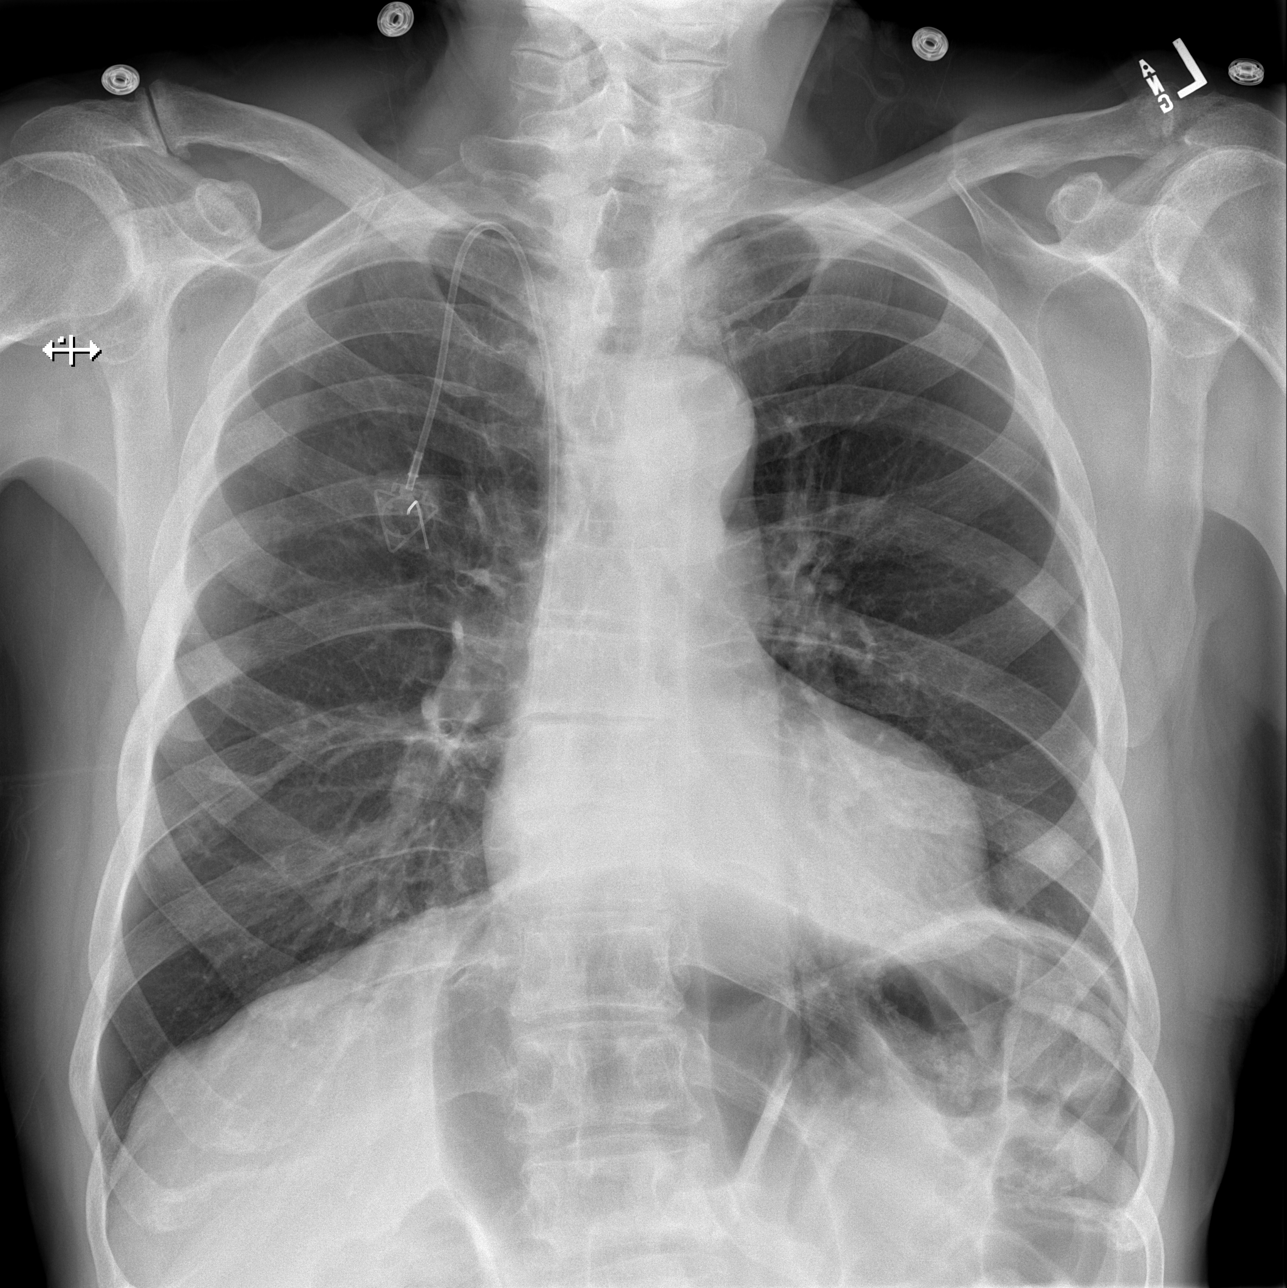

[w abdomen upright]
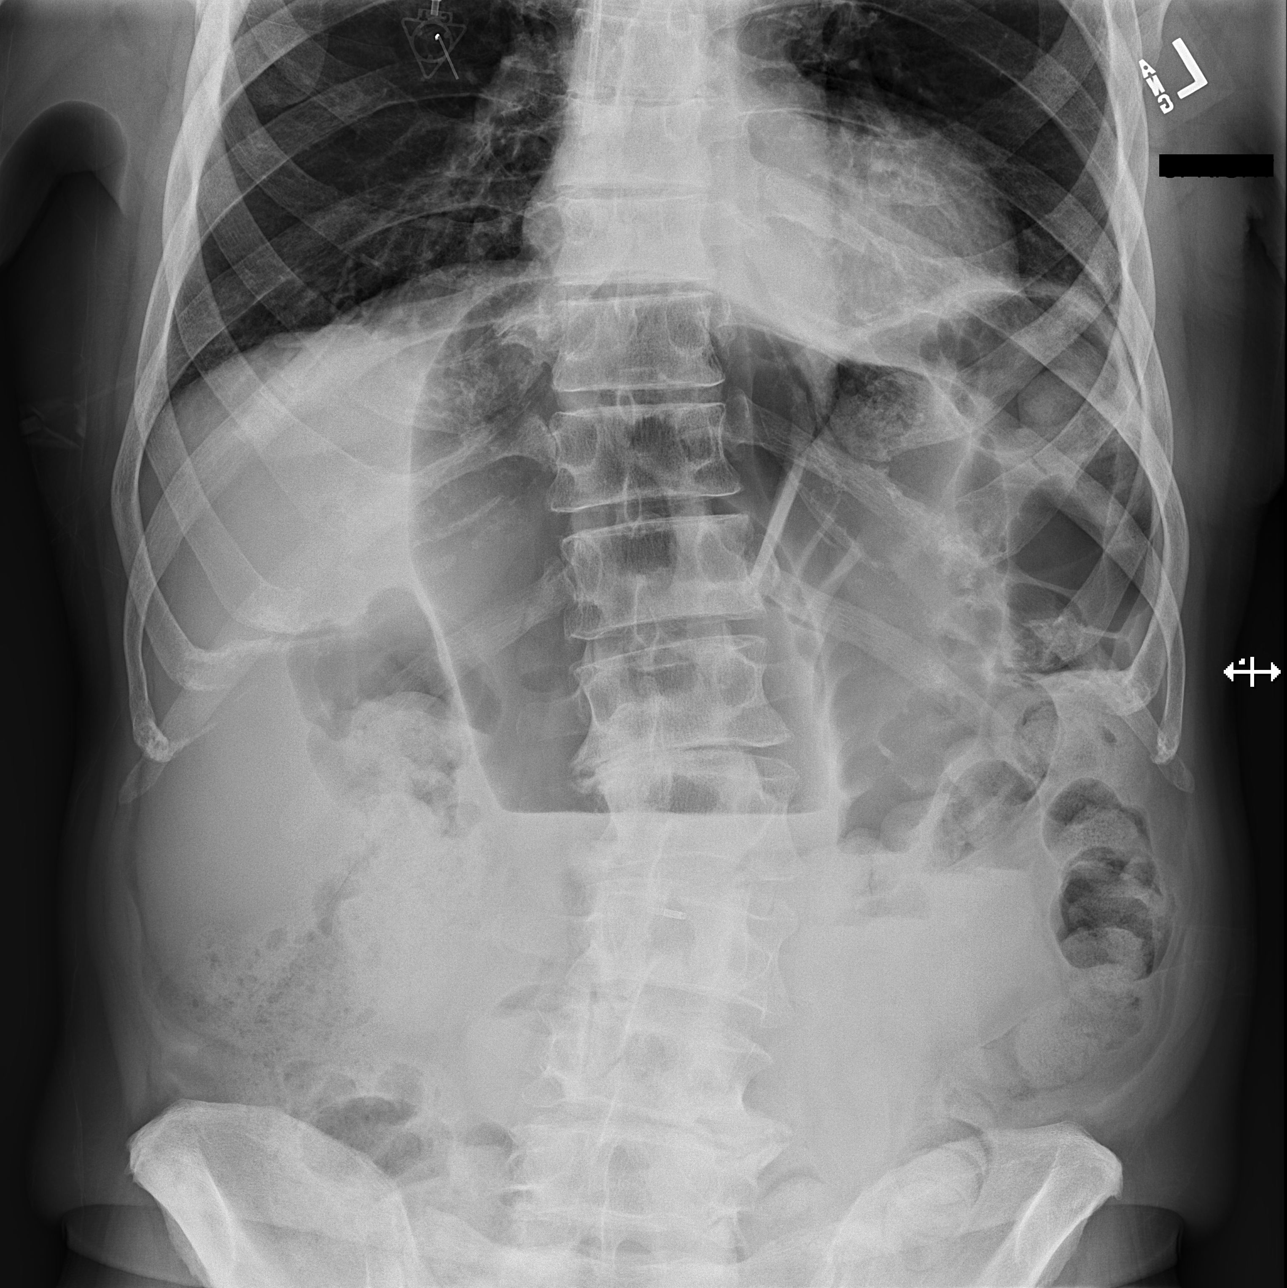

[t abdomen supine (1 of 2)]
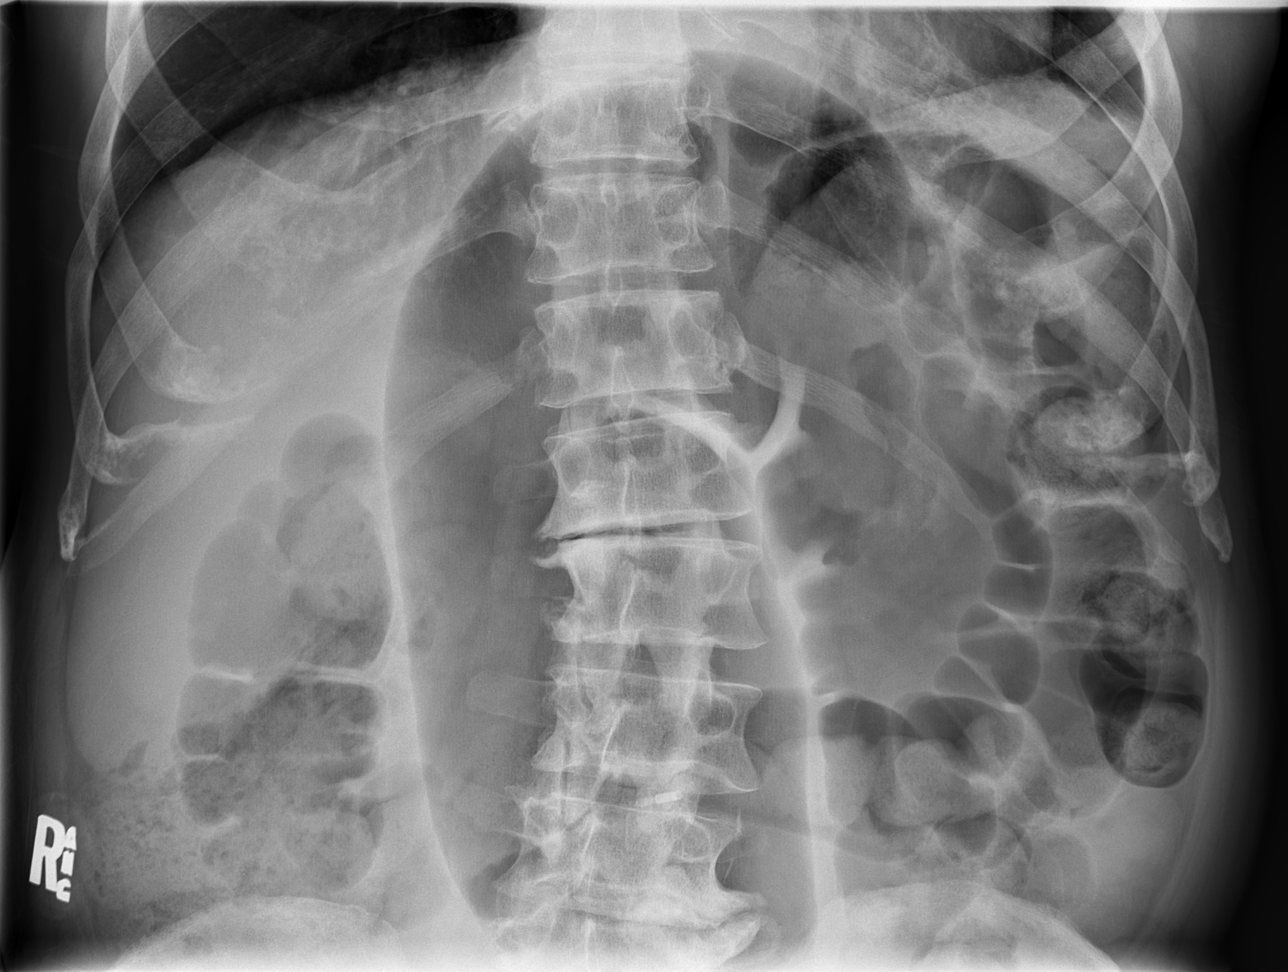

[t abdomen supine (2 of 2)]
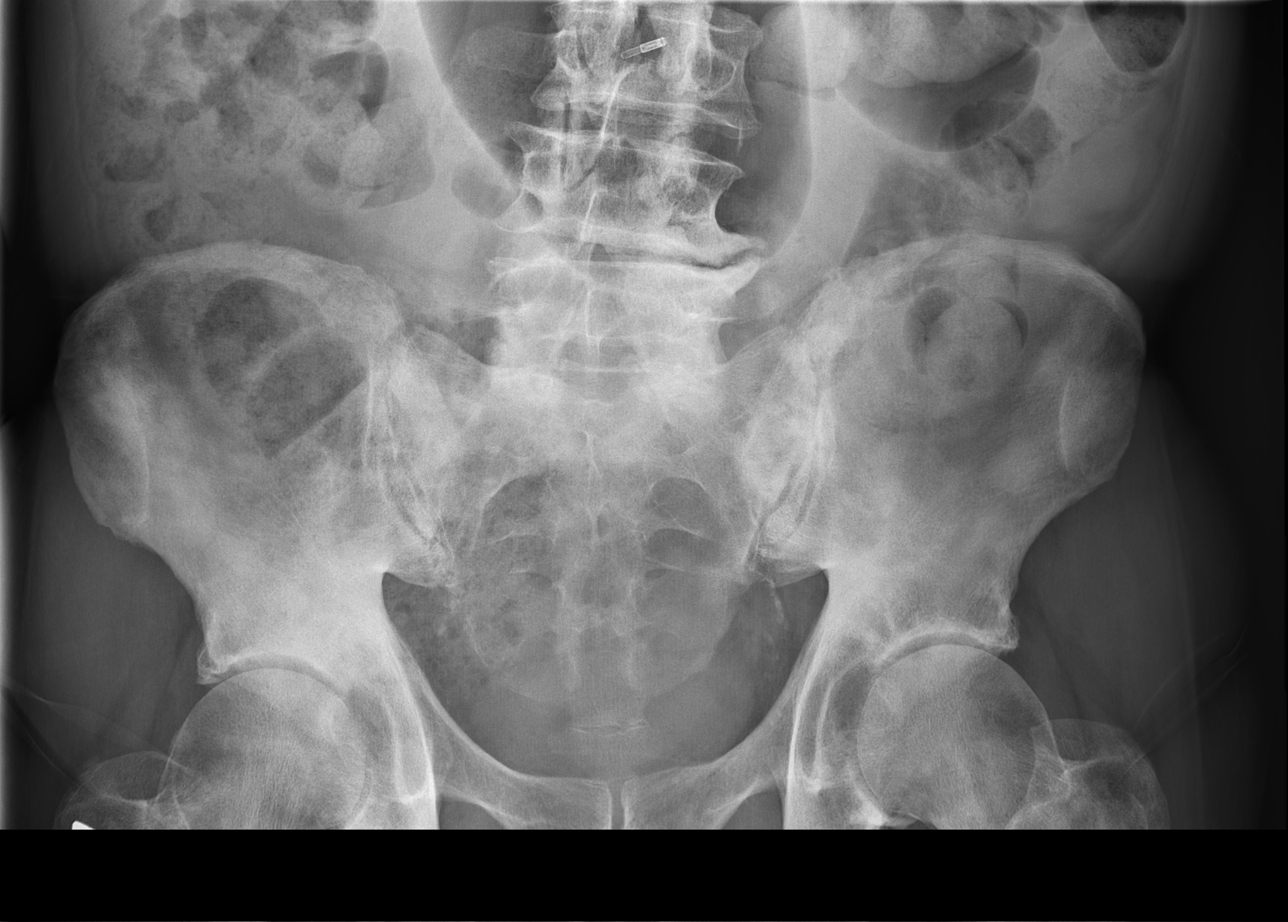

[4 of 4 positions shown; findings below may reference images not displayed]

FINDINGS: Chest: A right chest wall port is in place. The cardiomediastinal
silhouette is normal. There is no focal consolidation or pulmonary
edema. There is no pleural effusion or pneumothorax

Abdomen: There is a loop of large bowel in the mid abdomen which is
dilated measuring up to 11.8 cm with a layering fluid level. There
is no evidence of free intraperitoneal air.

There is no abnormal soft tissue calcification. There is no acute
osseous abnormality. There is levoscoliosis of the lumbar spine.
IMPRESSION: Findings above concerning for recurrent sigmoid volvulus. Recommend
surgical consultation.

These results were called by telephone at the time of interpretation
on 06/07/2021 at [DATE] to provider MWSHOES MORE LAZA MARRERO , who verbally
acknowledged these results.

## 2022-12-15 ENCOUNTER — Inpatient Hospital Stay: Payer: Medicare Other

## 2022-12-15 ENCOUNTER — Telehealth: Payer: Self-pay | Admitting: Pharmacy Technician

## 2022-12-15 DIAGNOSIS — Z23 Encounter for immunization: Secondary | ICD-10-CM | POA: Diagnosis not present

## 2022-12-15 NOTE — Telephone Encounter (Signed)
Oral Oncology Patient Advocate Encounter   Received notification that patient is due for re-enrollment for assistance for Xtandi through [employer].   Re-enrollment process has been initiated and will be submitted upon completion of necessary documents.  POI is required. Patient to bring documents to the office on 12/19/22  [facility] [employer] phone number [phone].   I will continue to follow until final determination.  Jinger Neighbors, CPhT-Adv Oncology Pharmacy Patient Advocate [facility] Direct Number: [phone]  Fax: [phone]

## 2022-12-16 ENCOUNTER — Inpatient Hospital Stay: Payer: Medicare Other

## 2022-12-16 ENCOUNTER — Inpatient Hospital Stay: Payer: Medicare Other | Admitting: Physician Assistant

## 2022-12-17 ENCOUNTER — Inpatient Hospital Stay: Payer: Medicare Other

## 2022-12-17 ENCOUNTER — Inpatient Hospital Stay: Payer: Medicare Other | Admitting: Hematology and Oncology

## 2022-12-18 DIAGNOSIS — L720 Epidermal cyst: Secondary | ICD-10-CM | POA: Diagnosis not present

## 2022-12-18 DIAGNOSIS — D1801 Hemangioma of skin and subcutaneous tissue: Secondary | ICD-10-CM | POA: Diagnosis not present

## 2022-12-18 DIAGNOSIS — L2089 Other atopic dermatitis: Secondary | ICD-10-CM | POA: Diagnosis not present

## 2022-12-18 DIAGNOSIS — L821 Other seborrheic keratosis: Secondary | ICD-10-CM | POA: Diagnosis not present

## 2022-12-18 DIAGNOSIS — L814 Other melanin hyperpigmentation: Secondary | ICD-10-CM | POA: Diagnosis not present

## 2022-12-19 ENCOUNTER — Inpatient Hospital Stay: Payer: Medicare Other | Attending: Oncology

## 2022-12-19 DIAGNOSIS — C61 Malignant neoplasm of prostate: Secondary | ICD-10-CM

## 2022-12-19 DIAGNOSIS — Z9079 Acquired absence of other genital organ(s): Secondary | ICD-10-CM | POA: Diagnosis not present

## 2022-12-19 DIAGNOSIS — C7951 Secondary malignant neoplasm of bone: Secondary | ICD-10-CM | POA: Diagnosis not present

## 2022-12-19 DIAGNOSIS — Z95828 Presence of other vascular implants and grafts: Secondary | ICD-10-CM

## 2022-12-19 DIAGNOSIS — C779 Secondary and unspecified malignant neoplasm of lymph node, unspecified: Secondary | ICD-10-CM | POA: Diagnosis not present

## 2022-12-19 LAB — CBC WITH DIFFERENTIAL (CANCER CENTER ONLY)
Abs Immature Granulocytes: 0.01 10*3/uL (ref 0.00–0.07)
Basophils Absolute: 0 10*3/uL (ref 0.0–0.1)
Basophils Relative: 0 %
Eosinophils Absolute: 0.1 10*3/uL (ref 0.0–0.5)
Eosinophils Relative: 2 %
HCT: 36.2 % — ABNORMAL LOW (ref 39.0–52.0)
Hemoglobin: 11.9 g/dL — ABNORMAL LOW (ref 13.0–17.0)
Immature Granulocytes: 0 %
Lymphocytes Relative: 27 %
Lymphs Abs: 1.6 10*3/uL (ref 0.7–4.0)
MCH: 31.3 pg (ref 26.0–34.0)
MCHC: 32.9 g/dL (ref 30.0–36.0)
MCV: 95.3 fL (ref 80.0–100.0)
Monocytes Absolute: 0.7 10*3/uL (ref 0.1–1.0)
Monocytes Relative: 11 %
Neutro Abs: 3.5 10*3/uL (ref 1.7–7.7)
Neutrophils Relative %: 60 %
Platelet Count: 237 10*3/uL (ref 150–400)
RBC: 3.8 MIL/uL — ABNORMAL LOW (ref 4.22–5.81)
RDW: 12.8 % (ref 11.5–15.5)
WBC Count: 5.8 10*3/uL (ref 4.0–10.5)
nRBC: 0 % (ref 0.0–0.2)

## 2022-12-19 LAB — CMP (CANCER CENTER ONLY)
ALT: 13 U/L (ref 0–44)
AST: 18 U/L (ref 15–41)
Albumin: 4.1 g/dL (ref 3.5–5.0)
Alkaline Phosphatase: 54 U/L (ref 38–126)
Anion gap: 6 (ref 5–15)
BUN: 25 mg/dL — ABNORMAL HIGH (ref 8–23)
CO2: 26 mmol/L (ref 22–32)
Calcium: 9.4 mg/dL (ref 8.9–10.3)
Chloride: 107 mmol/L (ref 98–111)
Creatinine: 1.18 mg/dL (ref 0.61–1.24)
GFR, Estimated: >60 mL/min (ref >60)
Glucose, Bld: 97 mg/dL (ref 70–99)
Potassium: 4.6 mmol/L (ref 3.5–5.1)
Sodium: 139 mmol/L (ref 135–145)
Total Bilirubin: 0.4 mg/dL (ref 0.3–1.2)
Total Protein: 6.9 g/dL (ref 6.5–8.1)

## 2022-12-19 MED ORDER — HEPARIN SOD (PORK) LOCK FLUSH 100 UNIT/ML IV SOLN
500.0000 [IU] | Freq: Once | INTRAVENOUS | Status: AC
  Administered 2022-12-19: 500 [IU]

## 2022-12-19 MED ORDER — SODIUM CHLORIDE 0.9% FLUSH
10.0000 mL | Freq: Once | INTRAVENOUS | Status: AC
  Administered 2022-12-19: 10 mL

## 2022-12-19 NOTE — Telephone Encounter (Signed)
Oral Oncology Patient Advocate Encounter  POI submitted via online portal.  Jinger Neighbors, CPhT-Adv Oncology Pharmacy Patient Advocate [facility] Direct Number: [phone]  Fax: [phone]

## 2022-12-22 ENCOUNTER — Other Ambulatory Visit: Payer: Self-pay | Admitting: Hematology and Oncology

## 2022-12-22 ENCOUNTER — Inpatient Hospital Stay (HOSPITAL_BASED_OUTPATIENT_CLINIC_OR_DEPARTMENT_OTHER): Payer: Medicare Other | Admitting: Hematology and Oncology

## 2022-12-22 ENCOUNTER — Inpatient Hospital Stay: Payer: Medicare Other

## 2022-12-22 VITALS — BP 140/77 | HR 72 | Temp 97.6°F | Resp 14 | Wt 198.0 lb

## 2022-12-22 DIAGNOSIS — C61 Malignant neoplasm of prostate: Secondary | ICD-10-CM

## 2022-12-22 DIAGNOSIS — Z9079 Acquired absence of other genital organ(s): Secondary | ICD-10-CM | POA: Diagnosis not present

## 2022-12-22 DIAGNOSIS — Z95828 Presence of other vascular implants and grafts: Secondary | ICD-10-CM

## 2022-12-22 DIAGNOSIS — C779 Secondary and unspecified malignant neoplasm of lymph node, unspecified: Secondary | ICD-10-CM | POA: Diagnosis not present

## 2022-12-22 DIAGNOSIS — C7951 Secondary malignant neoplasm of bone: Secondary | ICD-10-CM | POA: Diagnosis not present

## 2022-12-22 MED ORDER — DENOSUMAB 120 MG/1.7ML ~~LOC~~ SOLN
120.0000 mg | Freq: Once | SUBCUTANEOUS | Status: AC
  Administered 2022-12-22: 120 mg via SUBCUTANEOUS
  Filled 2022-12-22: qty 1.7

## 2022-12-22 MED ORDER — LORAZEPAM 1 MG PO TABS: 1.0000 mg | ORAL_TABLET | Freq: Three times a day (TID) | ORAL | 0 refills | Status: DC | PRN

## 2022-12-22 NOTE — Telephone Encounter (Addendum)
Oral Oncology Patient Advocate Encounter   Received notification that the application for assistance for Xtandi through [employer] has been denied due to income criteria not met.   [employer] number [id number].   I have spoken to the patient.  Jinger Neighbors, CPhT-Adv Oncology Pharmacy Patient Advocate [facility] Direct Number: [phone]  Fax: [phone]

## 2022-12-22 NOTE — Progress Notes (Signed)
[facility] [phone]   [phone]  PROGRESS NOTE  Patient Care Team: Etta Grandchild, MD as PCP - General (Internal Medicine) Hurshel Party, OD as Consulting Physician (Optometry) Berneice Heinrich, Delbert Phenix., MD as Consulting Physician (Urology) Benjiman Core, MD (Inactive) as Consulting Physician (Oncology)  Hematological/Oncological History # Castrate Resistant Advanced Prostate Cancer # Metastatic Prostate Cancer to Lymph Nodes/Bone  01/2015: orchiectomy performed 02/16/2015: Taxotere chemotherapy at 75 mg/m. Continued until 05/2015 09/2015: start of zytiga. Stopped in 2023 due to insurance issues 04/2021: Xtandi 160 mg daily  03/19/2022: last visit with Dr. Clelia Croft 05/20/2022: transition care to Dr. Leonides Schanz   Interval History:  Philip [name] 77 y.o. 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 The patient's last visit was on 10/15/2022. In the interim since the last visit he has continued on Xtandi therapy.   On exam today Philip [name] reports he has been tolerating his Xtandi medication well without any difficulty.  He is at no major changes in his health in the interim since her last visit.  He reports that he is trying to work with the patient advocate in order to get his [name] covered but unfortunately there are financial issues with the medication at this time.  He does have a supply and is working with our pharmacist to get this covered.  He is not having any major side effects as result of medication and reports it is much easier to tolerate as compared to his Zytiga.  He notes that he is not having any hot flashes or sweats.  He is not having any fatigue.  His appetite is good and his energy levels are strong.  He is tolerating his denosumab therapy well and is not having any pain in his jaw though he does report he has  "bone-on-bone" in his knee. He denies fevers, chills, sweats, shortness of breath, chest pain or cough. He has no other complaints.  A full 10 point ROS was otherwise negative.  MEDICAL HISTORY:  Past Medical History:  Diagnosis Date   Atherosclerosis of aorta (HCC) 03/11/2017   The 10-year ASCVD risk score Denman George DC Jr., et al., 2013) is: 24.6%   Values used to calculate the score:     Age: 22 years     Sex: Male     Is Non-Hispanic African American: No     Diabetic: No     Tobacco smoker: No     Systolic Blood Pressure: 154 mmHg     Is BP treated: No     HDL Cholesterol: 71.1 mg/dL     Total Cholesterol: 212 mg/dL   Cancer (HCC)    PROSTATE   Chronic idiopathic constipation 03/11/2017   Edema leg Sept 28, 2016   left leg from foot to thigh, increasinly worse over last 4 weeks   Essential hypertension 03/30/2018   Gastroesophageal reflux disease without esophagitis 03/11/2017   Hyperlipidemia LDL goal <100 03/16/2017   The 10-year ASCVD risk score Denman George DC Jr., et al., 2013) is: 30.6%   Values used to calculate the score:     Age: 48 years     Sex: Male     Is Non-Hispanic African American: No     Diabetic: No     Tobacco smoker: No     Systolic Blood Pressure: 148 mmHg     Is BP treated: Yes  HDL Cholesterol: 69.3 mg/dL     Total Cholesterol: 211 mg/dL   Hypertension    Hypoglycemia    Swelling LAST 30 DAYS DR [facility] AWARE   LEFT LEG AND FOOT    SURGICAL HISTORY: Past Surgical History:  Procedure Laterality Date   BOWEL DECOMPRESSION N/A 03/07/2021   Procedure: BOWEL DECOMPRESSION;  Surgeon: Benancio Deeds, MD;  Location: [facility];  Service: Gastroenterology;  Laterality: N/A;   BOWEL DECOMPRESSION N/A 06/07/2021   Procedure: BOWEL DECOMPRESSION;  Surgeon: Jeani Hawking, MD;  Location: [facility];  Service: Gastroenterology;  Laterality: N/A;   FLEXIBLE SIGMOIDOSCOPY N/A 03/07/2021   Procedure: FLEXIBLE SIGMOIDOSCOPY;  Surgeon: Benancio Deeds, MD;  Location: [facility];   Service: Gastroenterology;  Laterality: N/A;   FLEXIBLE SIGMOIDOSCOPY N/A 06/07/2021   Procedure: FLEXIBLE SIGMOIDOSCOPY;  Surgeon: Jeani Hawking, MD;  Location: [facility];  Service: Gastroenterology;  Laterality: N/A;   GUM SURGERY     TEETH IMPLANTS ALSO   LAPAROSCOPIC SIGMOID COLECTOMY N/A 06/10/2021   Procedure: LAPAROSCOPIC ASSISTED SIGMOID COLECTOMY;  Surgeon: Luretha Murphy, MD;  Location: [facility];  Service: General;  Laterality: N/A;   ORCHIECTOMY Bilateral 01/03/2015   Procedure: ORCHIECTOMY;  Surgeon: Sebastian Ache, MD;  Location: [facility];  Service: Urology;  Laterality: Bilateral;    SOCIAL HISTORY: Social History   Socioeconomic History   Marital status: Married    Spouse name: Not on file   Number of children: Not on file   Years of education: Not on file   Highest education level: Bachelor's degree (e.g., BA, AB, BS)  Occupational History   Not on file  Tobacco Use   Smoking status: Never   Smokeless tobacco: Never  Vaping Use   Vaping status: Never Used  Substance and Sexual Activity   Alcohol use: Yes    Alcohol/week: 1.0 standard drink of alcohol    Types: 1 Glasses of wine per week    Comment: occasional wine with dinner   Drug use: No   Sexual activity: Not on file  Other Topics Concern   Not on file  Social History Narrative   Not on file   Social Determinants of Health   Financial Resource Strain: Low Risk  (06/21/2022)   Overall Financial Resource Strain (CARDIA)    Difficulty of Paying Living Expenses: Not hard at all  Food Insecurity: No Food Insecurity (06/21/2022)   Hunger Vital Sign    Worried About Running Out of Food in the Last Year: Never true    Ran Out of Food in the Last Year: Never true  Transportation Needs: No Transportation Needs (06/21/2022)   PRAPARE - Administrator, Civil Service (Medical): No    Lack of Transportation (Non-Medical): No  Physical Activity: Sufficiently Active (06/21/2022)   Exercise Vital Sign     Days of Exercise per Week: 7 days    Minutes of Exercise per Session: 30 min  Stress: Stress Concern Present (06/21/2022)   [employer] of Occupational Health - Occupational Stress Questionnaire    Feeling of Stress : To some extent  Social Connections: Socially Isolated (06/21/2022)   Social Connection and Isolation Panel [NHANES]    Frequency of Communication with Friends and Family: Once a week    Frequency of Social Gatherings with Friends and Family: Never    Attends Religious Services: Never    Database administrator or Organizations: No    Attends Engineer, structural: Not on file    Marital Status: Married  Intimate Partner Violence: Not At Risk (12/04/2021)   Humiliation, Afraid, Rape, and Kick questionnaire    Fear of Current or Ex-Partner: No    Emotionally Abused: No    Physically Abused: No    Sexually Abused: No    FAMILY HISTORY: Family History  Problem Relation Age of Onset   CVA Mother    Lymphoma Mother    Pneumonia Mother    CAD Father    Osteoarthritis Brother    Colon cancer Neg Hx     ALLERGIES:  is allergic to prednisone.  MEDICATIONS:  Current Outpatient Medications  Medication Sig Dispense Refill   aspirin EC 81 MG tablet Take 81 mg by mouth daily. Swallow whole.     atorvastatin (LIPITOR) 10 MG tablet TAKE 1 TABLET BY MOUTH EVERY DAY 90 tablet 1   BIOTIN 5000 PO Take 1 tablet by mouth daily.     CALCIUM-VITAMIN D PO Take 1 tablet by mouth daily.     Denosumab (XGEVA [place]) Inject into the skin. Every 6-8 weeks.     enzalutamide (XTANDI) 40 MG tablet Take 4 tablets (160mg ) by mouth once daily as directed by physician. 120 tablet 6   ferrous sulfate 325 (65 FE) MG EC tablet TAKE 1 TABLET BY MOUTH EVERY DAY WITH BREAKFAST 90 tablet 1   fexofenadine (ALLEGRA) 180 MG tablet Take 180 mg by mouth daily.     hydrOXYzine (ATARAX/VISTARIL) 10 MG tablet Take 10 mg by mouth daily.     KLOR-CON M20 20 MEQ tablet TAKE 1 TABLET BY MOUTH TWICE A DAY  180 tablet 0   lidocaine-prilocaine (EMLA) cream Apply 1 Application topically as needed (access port). 30 g 0   LORazepam (ATIVAN) 1 MG tablet TAKE 1 TABLET BY MOUTH EVERY 8 HOURS AS NEEDED FOR ANXIETY 90 tablet 0   Multiple Vitamin (MULTIVITAMIN) tablet Take 1 tablet by mouth daily.     olmesartan (BENICAR) 20 MG tablet Take 1 tablet (20 mg total) by mouth daily. 90 tablet 1   omeprazole (PRILOSEC) 20 MG capsule TAKE 1 CAPSULE BY MOUTH DAILY AS NEEDED. 90 capsule 1   zinc gluconate 50 MG tablet Take 1 tablet (50 mg total) by mouth daily. (Patient not taking: Reported on 10/15/2022) 90 tablet 1   No current facility-administered medications for this visit.    REVIEW OF SYSTEMS:   Constitutional: ( - ) fevers, ( - )  chills , ( - ) night sweats Eyes: ( - ) blurriness of vision, ( - ) double vision, ( - ) watery eyes Ears, nose, mouth, throat, and face: ( - ) mucositis, ( - ) sore throat Respiratory: ( - ) cough, ( - ) dyspnea, ( - ) wheezes Cardiovascular: ( - ) palpitation, ( - ) chest discomfort, ( - ) lower extremity swelling Gastrointestinal:  ( - ) nausea, ( - ) heartburn, ( - ) change in bowel habits Skin: ( - ) abnormal skin rashes Lymphatics: ( - ) new lymphadenopathy, ( - ) easy bruising Neurological: ( - ) numbness, ( - ) tingling, ( - ) new weaknesses Behavioral/Psych: ( - ) mood change, ( - ) new changes  All other systems were reviewed with the patient and are negative.  PHYSICAL EXAMINATION: ECOG PERFORMANCE STATUS: 0 - Asymptomatic  Vitals:   12/22/22 1451  BP: (!) 140/77  Pulse: 72  Resp: 14  Temp: 97.6 F (36.4 C)  SpO2: 99%   Filed Weights   12/22/22 1451  Weight: 198 lb (  89.8 kg)    GENERAL: Well-appearing elderly Caucasian male, alert, no distress and comfortable SKIN: skin color, texture, turgor are normal, no rashes or significant lesions EYES: conjunctiva are pink and non-injected, sclera clear LUNGS: clear to auscultation and percussion with normal  breathing effort HEART: regular rate & rhythm and no murmurs and no lower extremity edema Musculoskeletal: no cyanosis of digits and no clubbing  PSYCH: alert & oriented x 3, fluent speech NEURO: no focal motor/sensory deficits  LABORATORY DATA:  I have reviewed the data as listed    Latest Ref Rng & Units 12/19/2022    2:45 PM 11/18/2022    1:44 PM 10/14/2022    1:25 PM  CBC  WBC 4.0 - 10.5 K/uL 5.8  5.3  5.9   Hemoglobin 13.0 - 17.0 g/dL 09.8  11.9  14.7   Hematocrit 39.0 - 52.0 % 36.2  36.0  36.3   Platelets 150 - 400 K/uL 237  240  230        Latest Ref Rng & Units 12/19/2022    2:45 PM 11/18/2022    1:44 PM 10/14/2022    1:25 PM  CMP  Glucose 70 - 99 mg/dL 97  829  562   BUN 8 - 23 mg/dL 25  19  23    Creatinine 0.61 - 1.24 mg/dL 1.30  8.65  7.84   Sodium 135 - 145 mmol/L 139  138  136   Potassium 3.5 - 5.1 mmol/L 4.6  4.5  4.9   Chloride 98 - 111 mmol/L 107  104  102   CO2 22 - 32 mmol/L 26  27  27    Calcium 8.9 - 10.3 mg/dL 9.4  9.3  9.4   Total Protein 6.5 - 8.1 g/dL 6.9  6.7  6.9   Total Bilirubin 0.3 - 1.2 mg/dL 0.4  0.4  0.5   Alkaline Phos 38 - 126 U/L 54  52  55   AST 15 - 41 U/L 18  16  18    ALT 0 - 44 U/L 13  11  13      RADIOGRAPHIC STUDIES: No results found.  ASSESSMENT & PLAN Philip [name] is a 77 y.o. 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  # Castrate Resistant Advanced Prostate Cancer # Metastatic Prostate Cancer to Lymph Nodes/Bone  --patient is s/p orchiectomy, no ADT therapy required --Most recent PSA level from 09/16/2022 was <0.1.  --continue Xtandi 160 mg PO daily -- last PSMA scan on 01/16/2022 showed response to therapy with no new progressive adenopathy or skeletal metastasis.  Will order new CT chest abdomen pelvis per patient request. --Labs today show white blood cell count 5.8, hemoglobin 11.9, platelets 237,  and creatinine 1.18 --Continue with monthly Xgeva shots with labs. RTC in 3 months for toxicity check.   Orders Placed This Encounter  Procedures   CT CHEST ABDOMEN PELVIS W CONTRAST    Standing Status:   Future    Standing Expiration Date:   12/22/2023    Order Specific Question:   If indicated for the ordered procedure, I authorize the administration of contrast media per Radiology protocol    Answer:   Yes    Order Specific Question:   Does the patient have a contrast media/X-ray dye allergy?    Answer:   No    Order Specific Question:   Preferred imaging location?  Answer:   [facility]    Order Specific Question:   If indicated for the ordered procedure, I authorize the administration of oral contrast media per Radiology protocol    Answer:   Yes    All questions were answered. The patient knows to call the clinic with any problems, questions or concerns.  I have spent a total of 30 minutes minutes of face-to-face and non-face-to-face time, preparing to see the patient,  performing a medically appropriate examination, counseling and educating the patient, documenting clinical information in the electronic health record, independently interpreting results and communicating results to the patient, and care coordination.   Ulysees Barns, MD Department of Hematology/Oncology [facility] Phone: [phone] Pager: [phone] Email: [email or web address]   12/22/2022 4:16 PM

## 2022-12-25 ENCOUNTER — Ambulatory Visit: Payer: Medicare Other | Admitting: Internal Medicine

## 2022-12-27 ENCOUNTER — Other Ambulatory Visit: Payer: Self-pay | Admitting: Gastroenterology

## 2023-01-01 ENCOUNTER — Telehealth: Payer: Self-pay | Admitting: Pharmacy Technician

## 2023-01-01 ENCOUNTER — Other Ambulatory Visit (HOSPITAL_COMMUNITY): Payer: Self-pay

## 2023-01-01 ENCOUNTER — Encounter: Payer: Self-pay | Admitting: Hematology and Oncology

## 2023-01-01 DIAGNOSIS — H524 Presbyopia: Secondary | ICD-10-CM | POA: Diagnosis not present

## 2023-01-01 DIAGNOSIS — H25013 Cortical age-related cataract, bilateral: Secondary | ICD-10-CM | POA: Diagnosis not present

## 2023-01-01 DIAGNOSIS — H2513 Age-related nuclear cataract, bilateral: Secondary | ICD-10-CM | POA: Diagnosis not present

## 2023-01-01 NOTE — Telephone Encounter (Signed)
Oral Oncology Patient Advocate Encounter  Was successful in securing patient a $8,000 grant from [employer] to provide copayment coverage for [place].  This will keep the out of pocket expense at $0.     [employer] ID: [id number]  I have spoken with the patient.   The billing information is as follows and has been shared with [facility].    RxBin: [id number] PCN: [id number] Member ID: [id number] Group ID: [id number] Dates of Eligibility: 11/22/22 through 11/21/23  Fund:  Prostate  Jinger Neighbors, CPhT-Adv Oncology Pharmacy Patient Advocate [facility] Direct Number: [phone]  Fax: [phone]

## 2023-01-15 ENCOUNTER — Encounter: Payer: Self-pay | Admitting: Internal Medicine

## 2023-01-16 ENCOUNTER — Inpatient Hospital Stay: Payer: Medicare Other | Attending: Oncology

## 2023-01-16 ENCOUNTER — Other Ambulatory Visit: Payer: Self-pay

## 2023-01-16 DIAGNOSIS — C779 Secondary and unspecified malignant neoplasm of lymph node, unspecified: Secondary | ICD-10-CM | POA: Insufficient documentation

## 2023-01-16 DIAGNOSIS — Z95828 Presence of other vascular implants and grafts: Secondary | ICD-10-CM

## 2023-01-16 DIAGNOSIS — C7951 Secondary malignant neoplasm of bone: Secondary | ICD-10-CM | POA: Diagnosis not present

## 2023-01-16 DIAGNOSIS — C61 Malignant neoplasm of prostate: Secondary | ICD-10-CM | POA: Diagnosis not present

## 2023-01-16 LAB — CBC WITH DIFFERENTIAL (CANCER CENTER ONLY)
Abs Immature Granulocytes: 0.01 10*3/uL (ref 0.00–0.07)
Basophils Absolute: 0 10*3/uL (ref 0.0–0.1)
Basophils Relative: 0 %
Eosinophils Absolute: 0.1 10*3/uL (ref 0.0–0.5)
Eosinophils Relative: 2 %
HCT: 35.9 % — ABNORMAL LOW (ref 39.0–52.0)
Hemoglobin: 12 g/dL — ABNORMAL LOW (ref 13.0–17.0)
Immature Granulocytes: 0 %
Lymphocytes Relative: 27 %
Lymphs Abs: 1.5 10*3/uL (ref 0.7–4.0)
MCH: 31.7 pg (ref 26.0–34.0)
MCHC: 33.4 g/dL (ref 30.0–36.0)
MCV: 95 fL (ref 80.0–100.0)
Monocytes Absolute: 0.5 10*3/uL (ref 0.1–1.0)
Monocytes Relative: 9 %
Neutro Abs: 3.6 10*3/uL (ref 1.7–7.7)
Neutrophils Relative %: 62 %
Platelet Count: 240 10*3/uL (ref 150–400)
RBC: 3.78 MIL/uL — ABNORMAL LOW (ref 4.22–5.81)
RDW: 12.9 % (ref 11.5–15.5)
WBC Count: 5.8 10*3/uL (ref 4.0–10.5)
nRBC: 0 % (ref 0.0–0.2)

## 2023-01-16 LAB — CMP (CANCER CENTER ONLY)
ALT: 11 U/L (ref 0–44)
AST: 16 U/L (ref 15–41)
Albumin: 4.1 g/dL (ref 3.5–5.0)
Alkaline Phosphatase: 57 U/L (ref 38–126)
Anion gap: 6 (ref 5–15)
BUN: 25 mg/dL — ABNORMAL HIGH (ref 8–23)
CO2: 26 mmol/L (ref 22–32)
Calcium: 9.1 mg/dL (ref 8.9–10.3)
Chloride: 104 mmol/L (ref 98–111)
Creatinine: 1.08 mg/dL (ref 0.61–1.24)
GFR, Estimated: >60 mL/min (ref >60)
Glucose, Bld: 131 mg/dL — ABNORMAL HIGH (ref 70–99)
Potassium: 4.2 mmol/L (ref 3.5–5.1)
Sodium: 136 mmol/L (ref 135–145)
Total Bilirubin: 0.3 mg/dL (ref <1.2)
Total Protein: 6.7 g/dL (ref 6.5–8.1)

## 2023-01-16 MED ORDER — HEPARIN SOD (PORK) LOCK FLUSH 100 UNIT/ML IV SOLN
500.0000 [IU] | Freq: Once | INTRAVENOUS | Status: AC
  Administered 2023-01-16: 500 [IU]

## 2023-01-16 MED ORDER — SODIUM CHLORIDE 0.9% FLUSH
10.0000 mL | Freq: Once | INTRAVENOUS | Status: AC
  Administered 2023-01-16: 10 mL

## 2023-01-16 MED ORDER — OMEPRAZOLE 20 MG PO CPDR: 20.0000 mg | DELAYED_RELEASE_CAPSULE | Freq: Every day | ORAL | 1 refills | Status: DC | PRN

## 2023-01-18 LAB — PROSTATE-SPECIFIC AG, SERUM (LABCORP): Prostate Specific Ag, Serum: <0.1 ng/mL (ref 0.0–4.0)

## 2023-01-19 ENCOUNTER — Inpatient Hospital Stay: Payer: Medicare Other

## 2023-01-19 VITALS — BP 146/85 | HR 80 | Resp 16

## 2023-01-19 DIAGNOSIS — C61 Malignant neoplasm of prostate: Secondary | ICD-10-CM

## 2023-01-19 DIAGNOSIS — C779 Secondary and unspecified malignant neoplasm of lymph node, unspecified: Secondary | ICD-10-CM | POA: Diagnosis not present

## 2023-01-19 DIAGNOSIS — Z95828 Presence of other vascular implants and grafts: Secondary | ICD-10-CM

## 2023-01-19 DIAGNOSIS — C7951 Secondary malignant neoplasm of bone: Secondary | ICD-10-CM | POA: Diagnosis not present

## 2023-01-19 MED ORDER — DENOSUMAB 120 MG/1.7ML ~~LOC~~ SOLN
120.0000 mg | Freq: Once | SUBCUTANEOUS | Status: AC
  Administered 2023-01-19: 120 mg via SUBCUTANEOUS

## 2023-01-19 NOTE — Patient Instructions (Signed)
Denosumab Injection (Oncology) What is this medication? DENOSUMAB (den oh SUE mab) prevents weakened bones caused by cancer. It may also be used to treat noncancerous bone tumors that cannot be removed by surgery. It can also be used to treat high calcium levels in the blood caused by cancer. It works by blocking a protein that causes bones to break down quickly. This slows down the release of calcium from bones, which lowers calcium levels in your blood. It also makes your bones stronger and less likely to break (fracture). This medicine may be used for other purposes; ask your health care provider or pharmacist if you have questions. COMMON BRAND NAME(S): XGEVA What should I tell my care team before I take this medication? They need to know if you have any of these conditions: Dental disease Having surgery or tooth extraction Infection Kidney disease Low levels of calcium or vitamin D in the blood Malnutrition On hemodialysis Skin conditions or sensitivity Thyroid or parathyroid disease An unusual reaction to denosumab, other medications, foods, dyes, or preservatives Pregnant or trying to get pregnant Breast-feeding How should I use this medication? This medication is for injection under the skin. It is given by your care team in a hospital or clinic setting. A special MedGuide will be given to you before each treatment. Be sure to read this information carefully each time. Talk to your care team about the use of this medication in children. While it may be prescribed for children as young as 13 years for selected conditions, precautions do apply. Overdosage: If you think you have taken too much of this medicine contact a poison control center or emergency room at once. NOTE: This medicine is only for you. Do not share this medicine with others. What if I miss a dose? Keep appointments for follow-up doses. It is important not to miss your dose. Call your care team if you are unable to  keep an appointment. What may interact with this medication? Do not take this medication with any of the following: Other medications containing denosumab This medication may also interact with the following: Medications that lower your chance of fighting infection Steroid medications, such as prednisone or cortisone This list may not describe all possible interactions. Give your health care provider a list of all the medicines, herbs, non-prescription drugs, or dietary supplements you use. Also tell them if you smoke, drink alcohol, or use illegal drugs. Some items may interact with your medicine. What should I watch for while using this medication? Your condition will be monitored carefully while you are receiving this medication. You may need blood work while taking this medication. This medication may increase your risk of getting an infection. Call your care team for advice if you get a fever, chills, sore throat, or other symptoms of a cold or flu. Do not treat yourself. Try to avoid being around people who are sick. You should make sure you get enough calcium and vitamin D while you are taking this medication, unless your care team tells you not to. Discuss the foods you eat and the vitamins you take with your care team. Some people who take this medication have severe bone, joint, or muscle pain. This medication may also increase your risk for jaw problems or a broken thigh bone. Tell your care team right away if you have severe pain in your jaw, bones, joints, or muscles. Tell your care team if you have any pain that does not go away or that gets worse. Talk  to your care team if you may be pregnant. Serious birth defects can occur if you take this medication during pregnancy and for 5 months after the last dose. You will need a negative pregnancy test before starting this medication. Contraception is recommended while taking this medication and for 5 months after the last dose. Your care team  can help you find the option that works for you. What side effects may I notice from receiving this medication? Side effects that you should report to your care team as soon as possible: Allergic reactions--skin rash, itching, hives, swelling of the face, lips, tongue, or throat Bone, joint, or muscle pain Low calcium level--muscle pain or cramps, confusion, tingling, or numbness in the hands or feet Osteonecrosis of the jaw--pain, swelling, or redness in the mouth, numbness of the jaw, poor healing after dental work, unusual discharge from the mouth, visible bones in the mouth Side effects that usually do not require medical attention (report to your care team if they continue or are bothersome): Cough Diarrhea Fatigue Headache Nausea This list may not describe all possible side effects. Call your doctor for medical advice about side effects. You may report side effects to FDA at 1-800-FDA-1088. Where should I keep my medication? This medication is given in a hospital or clinic. It will not be stored at home. NOTE: This sheet is a summary. It may not cover all possible information. If you have questions about this medicine, talk to your doctor, pharmacist, or health care provider.  2023 Elsevier/Gold Standard (2021-07-08 00:00:00)  

## 2023-01-20 ENCOUNTER — Other Ambulatory Visit (HOSPITAL_COMMUNITY): Payer: Self-pay

## 2023-01-20 ENCOUNTER — Encounter: Payer: Self-pay | Admitting: Hematology and Oncology

## 2023-01-21 ENCOUNTER — Telehealth: Payer: Self-pay

## 2023-01-21 NOTE — Telephone Encounter (Signed)
-----   Message from Nurse Lanora Manis T sent at 01/21/2023  9:22 AM EST -----  ----- Message ----- From: Jaci Standard, MD Sent: 01/20/2023   4:09 PM EST To: Kyra Searles, RN  Please let Mr. Cuaresma know that his PSA remains undetectable.  We will plan to see him back as scheduled. ----- Message ----- From: Leory Plowman, Lab In [place] Sent: 01/16/2023   3:16 PM EST To: Jaci Standard, MD

## 2023-01-21 NOTE — Telephone Encounter (Signed)
LM for pt with lab results and recommendations. 

## 2023-01-22 ENCOUNTER — Ambulatory Visit (HOSPITAL_COMMUNITY)
Admission: RE | Admit: 2023-01-22 | Discharge: 2023-01-22 | Disposition: A | Discharge status: Home / Self Care | Payer: Medicare Other | Source: Ambulatory Visit | Attending: Hematology and Oncology | Admitting: Hematology and Oncology

## 2023-01-22 DIAGNOSIS — I700 Atherosclerosis of aorta: Secondary | ICD-10-CM | POA: Diagnosis not present

## 2023-01-22 DIAGNOSIS — C7951 Secondary malignant neoplasm of bone: Secondary | ICD-10-CM | POA: Diagnosis not present

## 2023-01-22 DIAGNOSIS — C61 Malignant neoplasm of prostate: Secondary | ICD-10-CM | POA: Insufficient documentation

## 2023-01-22 MED ORDER — IOHEXOL 300 MG/ML  SOLN
100.0000 mL | Freq: Once | INTRAMUSCULAR | Status: AC | PRN
  Administered 2023-01-22: 100 mL via INTRAVENOUS

## 2023-01-22 MED ORDER — HEPARIN SOD (PORK) LOCK FLUSH 100 UNIT/ML IV SOLN
500.0000 [IU] | Freq: Once | INTRAVENOUS | Status: AC
  Administered 2023-01-22: 500 [IU] via INTRAVENOUS

## 2023-01-22 MED ORDER — HEPARIN SOD (PORK) LOCK FLUSH 100 UNIT/ML IV SOLN
INTRAVENOUS | Status: AC
  Filled 2023-01-22: qty 5

## 2023-01-25 ENCOUNTER — Other Ambulatory Visit: Payer: Self-pay | Admitting: Internal Medicine

## 2023-01-25 DIAGNOSIS — I10 Essential (primary) hypertension: Secondary | ICD-10-CM

## 2023-01-25 DIAGNOSIS — E876 Hypokalemia: Secondary | ICD-10-CM

## 2023-02-10 ENCOUNTER — Telehealth: Payer: Self-pay | Admitting: *Deleted

## 2023-02-10 NOTE — Telephone Encounter (Addendum)
Contacted Mr. Veeder per Dr. Derek Mound directions with information in message below. Patient verbalized understanding.   ----- Message ----- From: Jaci Standard, MD Sent: 02/08/2023  10:36 AM EST To: Kyra Searles, RN  Please let Mr. Pardi know that his CT scan showed stable prostate cancer. There is no progression or no new areas of disease/ We will continue on his current treatment and see him back in Jan 2025

## 2023-02-13 ENCOUNTER — Other Ambulatory Visit: Payer: Self-pay | Admitting: Internal Medicine

## 2023-02-13 ENCOUNTER — Inpatient Hospital Stay: Payer: Medicare Other | Attending: Oncology

## 2023-02-13 DIAGNOSIS — I700 Atherosclerosis of aorta: Secondary | ICD-10-CM

## 2023-02-13 DIAGNOSIS — C779 Secondary and unspecified malignant neoplasm of lymph node, unspecified: Secondary | ICD-10-CM | POA: Diagnosis not present

## 2023-02-13 DIAGNOSIS — C7951 Secondary malignant neoplasm of bone: Secondary | ICD-10-CM | POA: Diagnosis not present

## 2023-02-13 DIAGNOSIS — E785 Hyperlipidemia, unspecified: Secondary | ICD-10-CM

## 2023-02-13 DIAGNOSIS — C61 Malignant neoplasm of prostate: Secondary | ICD-10-CM | POA: Insufficient documentation

## 2023-02-13 DIAGNOSIS — Z95828 Presence of other vascular implants and grafts: Secondary | ICD-10-CM

## 2023-02-13 LAB — CBC WITH DIFFERENTIAL (CANCER CENTER ONLY)
Abs Immature Granulocytes: 0.02 10*3/uL (ref 0.00–0.07)
Basophils Absolute: 0 10*3/uL (ref 0.0–0.1)
Basophils Relative: 1 %
Eosinophils Absolute: 0.1 10*3/uL (ref 0.0–0.5)
Eosinophils Relative: 1 %
HCT: 38.1 % — ABNORMAL LOW (ref 39.0–52.0)
Hemoglobin: 12.6 g/dL — ABNORMAL LOW (ref 13.0–17.0)
Immature Granulocytes: 0 %
Lymphocytes Relative: 26 %
Lymphs Abs: 1.7 10*3/uL (ref 0.7–4.0)
MCH: 31.3 pg (ref 26.0–34.0)
MCHC: 33.1 g/dL (ref 30.0–36.0)
MCV: 94.5 fL (ref 80.0–100.0)
Monocytes Absolute: 0.7 10*3/uL (ref 0.1–1.0)
Monocytes Relative: 10 %
Neutro Abs: 4.1 10*3/uL (ref 1.7–7.7)
Neutrophils Relative %: 62 %
Platelet Count: 267 10*3/uL (ref 150–400)
RBC: 4.03 MIL/uL — ABNORMAL LOW (ref 4.22–5.81)
RDW: 13.1 % (ref 11.5–15.5)
WBC Count: 6.6 10*3/uL (ref 4.0–10.5)
nRBC: 0 % (ref 0.0–0.2)

## 2023-02-13 LAB — CMP (CANCER CENTER ONLY)
ALT: 12 U/L (ref 0–44)
AST: 18 U/L (ref 15–41)
Albumin: 4.2 g/dL (ref 3.5–5.0)
Alkaline Phosphatase: 53 U/L (ref 38–126)
Anion gap: 9 (ref 5–15)
BUN: 30 mg/dL — ABNORMAL HIGH (ref 8–23)
CO2: 26 mmol/L (ref 22–32)
Calcium: 9.6 mg/dL (ref 8.9–10.3)
Chloride: 102 mmol/L (ref 98–111)
Creatinine: 1.2 mg/dL (ref 0.61–1.24)
GFR, Estimated: >60 mL/min (ref >60)
Glucose, Bld: 122 mg/dL — ABNORMAL HIGH (ref 70–99)
Potassium: 4.7 mmol/L (ref 3.5–5.1)
Sodium: 137 mmol/L (ref 135–145)
Total Bilirubin: 0.4 mg/dL (ref <1.2)
Total Protein: 6.9 g/dL (ref 6.5–8.1)

## 2023-02-13 MED ORDER — SODIUM CHLORIDE 0.9% FLUSH
10.0000 mL | Freq: Once | INTRAVENOUS | Status: AC
  Administered 2023-02-13: 10 mL

## 2023-02-13 MED ORDER — HEPARIN SOD (PORK) LOCK FLUSH 100 UNIT/ML IV SOLN
500.0000 [IU] | Freq: Once | INTRAVENOUS | Status: AC
  Administered 2023-02-13: 500 [IU]

## 2023-02-14 LAB — PROSTATE-SPECIFIC AG, SERUM (LABCORP): Prostate Specific Ag, Serum: <0.1 ng/mL (ref 0.0–4.0)

## 2023-02-16 ENCOUNTER — Inpatient Hospital Stay: Payer: Medicare Other

## 2023-02-16 VITALS — BP 136/80 | HR 81 | Temp 97.9°F | Resp 16

## 2023-02-16 DIAGNOSIS — C7951 Secondary malignant neoplasm of bone: Secondary | ICD-10-CM | POA: Diagnosis not present

## 2023-02-16 DIAGNOSIS — C779 Secondary and unspecified malignant neoplasm of lymph node, unspecified: Secondary | ICD-10-CM | POA: Diagnosis not present

## 2023-02-16 DIAGNOSIS — Z95828 Presence of other vascular implants and grafts: Secondary | ICD-10-CM

## 2023-02-16 DIAGNOSIS — C61 Malignant neoplasm of prostate: Secondary | ICD-10-CM | POA: Diagnosis not present

## 2023-02-16 MED ORDER — DENOSUMAB 120 MG/1.7ML ~~LOC~~ SOLN
120.0000 mg | Freq: Once | SUBCUTANEOUS | Status: AC
Start: 2023-02-16 — End: 2023-02-16
  Administered 2023-02-16: 120 mg via SUBCUTANEOUS
  Filled 2023-02-16: qty 1.7

## 2023-02-17 ENCOUNTER — Telehealth: Payer: Self-pay | Admitting: *Deleted

## 2023-02-17 NOTE — Telephone Encounter (Addendum)
Contacted patient with information in message below per MD directions.  Patient verbalized understanding of information.  ----- Message ----- From: Jaci Standard, MD Sent: 02/16/2023   8:28 AM EST To: Kyra Searles, RN  Please let Mr. Solorzano know that his PSA remains <0.1 (undetectable). We will continue treatment as scheduled.

## 2023-02-18 ENCOUNTER — Other Ambulatory Visit: Payer: Self-pay | Admitting: Gastroenterology

## 2023-03-05 ENCOUNTER — Other Ambulatory Visit: Payer: Self-pay | Admitting: Pharmacy Technician

## 2023-03-05 ENCOUNTER — Other Ambulatory Visit (HOSPITAL_COMMUNITY): Payer: Self-pay

## 2023-03-05 ENCOUNTER — Other Ambulatory Visit: Payer: Self-pay

## 2023-03-05 ENCOUNTER — Other Ambulatory Visit: Payer: Self-pay | Admitting: *Deleted

## 2023-03-05 DIAGNOSIS — C61 Malignant neoplasm of prostate: Secondary | ICD-10-CM

## 2023-03-05 MED ORDER — ENZALUTAMIDE 40 MG PO TABS
ORAL_TABLET | ORAL | 6 refills | Status: DC
  Filled 2023-03-05: qty 120, 30d supply, fill #0
  Filled 2023-03-25: qty 120, 30d supply, fill #1
  Filled 2023-04-22: qty 120, 30d supply, fill #2
  Filled 2023-05-26: qty 120, 30d supply, fill #3
  Filled 2023-06-19: qty 120, 30d supply, fill #4
  Filled 2023-07-22: qty 120, 30d supply, fill #5
  Filled 2023-08-20: qty 120, 30d supply, fill #6

## 2023-03-05 NOTE — Progress Notes (Signed)
 Specialty Pharmacy Initial Fill Coordination Note  Philip [name] is a 78 y.o. male contacted today regarding refills of specialty medication(s) Enzalutamide  (XTANDI ) .  Patient requested [name] at [facility] at [place]  on 03/09/23   Medication will be filled on 03/06/23.   Patient is aware of $0 copayment.

## 2023-03-05 NOTE — Progress Notes (Signed)
 Patient is transferring from patient assistance to [place] with a grant. Patient counseled in telephone encounter opened on 04/11/2021.  Kateri Balch, PharmD Hematology/Oncology Clinical Pharmacist [facility] [phone]

## 2023-03-06 ENCOUNTER — Other Ambulatory Visit: Payer: Self-pay

## 2023-03-07 ENCOUNTER — Other Ambulatory Visit (HOSPITAL_COMMUNITY): Payer: Self-pay

## 2023-03-15 ENCOUNTER — Other Ambulatory Visit: Payer: Self-pay | Admitting: Internal Medicine

## 2023-03-18 ENCOUNTER — Inpatient Hospital Stay: Payer: Medicare Other | Attending: Oncology

## 2023-03-18 DIAGNOSIS — Z95828 Presence of other vascular implants and grafts: Secondary | ICD-10-CM

## 2023-03-18 DIAGNOSIS — C7951 Secondary malignant neoplasm of bone: Secondary | ICD-10-CM | POA: Diagnosis not present

## 2023-03-18 DIAGNOSIS — Z9079 Acquired absence of other genital organ(s): Secondary | ICD-10-CM | POA: Insufficient documentation

## 2023-03-18 DIAGNOSIS — C779 Secondary and unspecified malignant neoplasm of lymph node, unspecified: Secondary | ICD-10-CM | POA: Diagnosis not present

## 2023-03-18 DIAGNOSIS — Z807 Family history of other malignant neoplasms of lymphoid, hematopoietic and related tissues: Secondary | ICD-10-CM | POA: Diagnosis not present

## 2023-03-18 DIAGNOSIS — C61 Malignant neoplasm of prostate: Secondary | ICD-10-CM | POA: Diagnosis not present

## 2023-03-18 LAB — CBC WITH DIFFERENTIAL (CANCER CENTER ONLY)
Abs Immature Granulocytes: 0.02 10*3/uL (ref 0.00–0.07)
Basophils Absolute: 0 10*3/uL (ref 0.0–0.1)
Basophils Relative: 0 %
Eosinophils Absolute: 0.1 10*3/uL (ref 0.0–0.5)
Eosinophils Relative: 1 %
HCT: 35.4 % — ABNORMAL LOW (ref 39.0–52.0)
Hemoglobin: 11.9 g/dL — ABNORMAL LOW (ref 13.0–17.0)
Immature Granulocytes: 0 %
Lymphocytes Relative: 27 %
Lymphs Abs: 1.5 10*3/uL (ref 0.7–4.0)
MCH: 31.3 pg (ref 26.0–34.0)
MCHC: 33.6 g/dL (ref 30.0–36.0)
MCV: 93.2 fL (ref 80.0–100.0)
Monocytes Absolute: 0.5 10*3/uL (ref 0.1–1.0)
Monocytes Relative: 9 %
Neutro Abs: 3.5 10*3/uL (ref 1.7–7.7)
Neutrophils Relative %: 63 %
Platelet Count: 253 10*3/uL (ref 150–400)
RBC: 3.8 MIL/uL — ABNORMAL LOW (ref 4.22–5.81)
RDW: 13.2 % (ref 11.5–15.5)
WBC Count: 5.5 10*3/uL (ref 4.0–10.5)
nRBC: 0 % (ref 0.0–0.2)

## 2023-03-18 LAB — CMP (CANCER CENTER ONLY)
ALT: 11 U/L (ref 0–44)
AST: 16 U/L (ref 15–41)
Albumin: 4 g/dL (ref 3.5–5.0)
Alkaline Phosphatase: 47 U/L (ref 38–126)
Anion gap: 5 (ref 5–15)
BUN: 23 mg/dL (ref 8–23)
CO2: 28 mmol/L (ref 22–32)
Calcium: 9.7 mg/dL (ref 8.9–10.3)
Chloride: 104 mmol/L (ref 98–111)
Creatinine: 1.17 mg/dL (ref 0.61–1.24)
GFR, Estimated: >60 mL/min (ref >60)
Glucose, Bld: 108 mg/dL — ABNORMAL HIGH (ref 70–99)
Potassium: 4.4 mmol/L (ref 3.5–5.1)
Sodium: 137 mmol/L (ref 135–145)
Total Bilirubin: 0.4 mg/dL (ref 0.0–1.2)
Total Protein: 6.7 g/dL (ref 6.5–8.1)

## 2023-03-18 MED ORDER — SODIUM CHLORIDE 0.9% FLUSH
10.0000 mL | Freq: Once | INTRAVENOUS | Status: AC
  Administered 2023-03-18: 10 mL

## 2023-03-18 MED ORDER — HEPARIN SOD (PORK) LOCK FLUSH 100 UNIT/ML IV SOLN
500.0000 [IU] | Freq: Once | INTRAVENOUS | Status: AC
  Administered 2023-03-18: 500 [IU]

## 2023-03-20 ENCOUNTER — Inpatient Hospital Stay: Payer: Medicare Other

## 2023-03-20 ENCOUNTER — Inpatient Hospital Stay (HOSPITAL_BASED_OUTPATIENT_CLINIC_OR_DEPARTMENT_OTHER): Payer: Medicare Other | Attending: Oncology | Admitting: Hematology and Oncology

## 2023-03-20 VITALS — BP 139/85 | HR 77 | Temp 97.9°F | Resp 14 | Wt 191.7 lb

## 2023-03-20 DIAGNOSIS — C779 Secondary and unspecified malignant neoplasm of lymph node, unspecified: Secondary | ICD-10-CM | POA: Diagnosis not present

## 2023-03-20 DIAGNOSIS — Z9079 Acquired absence of other genital organ(s): Secondary | ICD-10-CM | POA: Diagnosis not present

## 2023-03-20 DIAGNOSIS — C61 Malignant neoplasm of prostate: Secondary | ICD-10-CM

## 2023-03-20 DIAGNOSIS — Z807 Family history of other malignant neoplasms of lymphoid, hematopoietic and related tissues: Secondary | ICD-10-CM | POA: Diagnosis not present

## 2023-03-20 DIAGNOSIS — Z95828 Presence of other vascular implants and grafts: Secondary | ICD-10-CM

## 2023-03-20 DIAGNOSIS — C7951 Secondary malignant neoplasm of bone: Secondary | ICD-10-CM | POA: Diagnosis not present

## 2023-03-20 LAB — PROSTATE-SPECIFIC AG, SERUM (LABCORP): Prostate Specific Ag, Serum: <0.1 ng/mL (ref 0.0–4.0)

## 2023-03-20 NOTE — Progress Notes (Unsigned)
[facility] [phone]   [phone]  PROGRESS NOTE  Patient Care Team: Etta Grandchild, MD as PCP - General (Internal Medicine) Hurshel Party, OD as Consulting Physician (Optometry) Berneice Heinrich, Delbert Phenix., MD as Consulting Physician (Urology) Benjiman Core, MD (Inactive) as Consulting Physician (Oncology)  Hematological/Oncological History # Castrate Resistant Advanced Prostate Cancer # Metastatic Prostate Cancer to Lymph Nodes/Bone  01/2015: orchiectomy performed 02/16/2015: Taxotere chemotherapy at 75 mg/m. Continued until 05/2015 09/2015: start of zytiga. Stopped in 2023 due to insurance issues 04/2021: Xtandi 160 mg daily  03/19/2022: last visit with Dr. Clelia Croft 05/20/2022: transition care to Dr. Leonides Schanz   Interval History:  Philip [name] 78 y.o. 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 The patient's last visit was on 12/22/2022. In the interim since the last visit he has continued on Xtandi therapy.   On exam today Philip [name] reports he would like to hold on his denosumab therapy today as he has upcoming dental work.  He reports he has been having some issue with feeling fatigued and most the time he "has a hard time waking up".  He notes he takes his Xtandi 160 mg p.o. daily without any difficulty.  He reports that he has lost about 10 pounds in the interim since August.  He notes his appetite has been poor and he "fills up easily". He denies fevers, chills, sweats, shortness of breath, chest pain or cough. He has no other complaints.  A full 10 point ROS was otherwise negative.  MEDICAL HISTORY:  Past Medical History:  Diagnosis Date   Atherosclerosis of aorta (HCC) 03/11/2017   The 10-year ASCVD risk score Denman George DC Jr., et al., 2013) is: 24.6%   Values used to calculate the score:     Age: 41 years     Sex: Male     Is  Non-Hispanic African American: No     Diabetic: No     Tobacco smoker: No     Systolic Blood Pressure: 154 mmHg     Is BP treated: No     HDL Cholesterol: 71.1 mg/dL     Total Cholesterol: 212 mg/dL   Cancer (HCC)    PROSTATE   Chronic idiopathic constipation 03/11/2017   Edema leg Sept 28, 2016   left leg from foot to thigh, increasinly worse over last 4 weeks   Essential hypertension 03/30/2018   Gastroesophageal reflux disease without esophagitis 03/11/2017   Hyperlipidemia LDL goal <100 03/16/2017   The 10-year ASCVD risk score Denman George DC Jr., et al., 2013) is: 30.6%   Values used to calculate the score:     Age: 44 years     Sex: Male     Is Non-Hispanic African American: No     Diabetic: No     Tobacco smoker: No     Systolic Blood Pressure: 148 mmHg     Is BP treated: Yes     HDL Cholesterol: 69.3 mg/dL     Total Cholesterol: 211 mg/dL   Hypertension    Hypoglycemia    Swelling LAST 30 DAYS DR [facility] AWARE   LEFT LEG AND FOOT    SURGICAL HISTORY: Past Surgical History:  Procedure Laterality Date   BOWEL DECOMPRESSION N/A 03/07/2021   Procedure: BOWEL DECOMPRESSION;  Surgeon: Benancio Deeds, MD;  Location: [facility];  Service: Gastroenterology;  Laterality: N/A;  BOWEL DECOMPRESSION N/A 06/07/2021   Procedure: BOWEL DECOMPRESSION;  Surgeon: Jeani Hawking, MD;  Location: [facility];  Service: Gastroenterology;  Laterality: N/A;   FLEXIBLE SIGMOIDOSCOPY N/A 03/07/2021   Procedure: FLEXIBLE SIGMOIDOSCOPY;  Surgeon: Benancio Deeds, MD;  Location: [facility];  Service: Gastroenterology;  Laterality: N/A;   FLEXIBLE SIGMOIDOSCOPY N/A 06/07/2021   Procedure: FLEXIBLE SIGMOIDOSCOPY;  Surgeon: Jeani Hawking, MD;  Location: [facility];  Service: Gastroenterology;  Laterality: N/A;   GUM SURGERY     TEETH IMPLANTS ALSO   LAPAROSCOPIC SIGMOID COLECTOMY N/A 06/10/2021   Procedure: LAPAROSCOPIC ASSISTED SIGMOID COLECTOMY;  Surgeon: Luretha Murphy, MD;  Location: [facility];  Service: General;   Laterality: N/A;   ORCHIECTOMY Bilateral 01/03/2015   Procedure: ORCHIECTOMY;  Surgeon: Sebastian Ache, MD;  Location: [facility];  Service: Urology;  Laterality: Bilateral;    SOCIAL HISTORY: Social History   Socioeconomic History   Marital status: Married    Spouse name: Not on file   Number of children: Not on file   Years of education: Not on file   Highest education level: Bachelor's degree (e.g., BA, AB, BS)  Occupational History   Not on file  Tobacco Use   Smoking status: Never   Smokeless tobacco: Never  Vaping Use   Vaping status: Never Used  Substance and Sexual Activity   Alcohol use: Yes    Alcohol/week: 1.0 standard drink of alcohol    Types: 1 Glasses of wine per week    Comment: occasional wine with dinner   Drug use: No   Sexual activity: Not on file  Other Topics Concern   Not on file  Social History Narrative   Not on file   Social Drivers of Health   Financial Resource Strain: Low Risk  (06/21/2022)   Overall Financial Resource Strain (CARDIA)    Difficulty of Paying Living Expenses: Not hard at all  Food Insecurity: No Food Insecurity (06/21/2022)   Hunger Vital Sign    Worried About Running Out of Food in the Last Year: Never true    Ran Out of Food in the Last Year: Never true  Transportation Needs: No Transportation Needs (06/21/2022)   PRAPARE - Administrator, Civil Service (Medical): No    Lack of Transportation (Non-Medical): No  Physical Activity: Sufficiently Active (06/21/2022)   Exercise Vital Sign    Days of Exercise per Week: 7 days    Minutes of Exercise per Session: 30 min  Stress: Stress Concern Present (06/21/2022)   [employer] of Occupational Health - Occupational Stress Questionnaire    Feeling of Stress : To some extent  Social Connections: Socially Isolated (06/21/2022)   Social Connection and Isolation Panel [NHANES]    Frequency of Communication with Friends and Family: Once a week    Frequency of Social  Gatherings with Friends and Family: Never    Attends Religious Services: Never    Database administrator or Organizations: No    Attends Engineer, structural: Not on file    Marital Status: Married  Catering manager Violence: Not At Risk (12/04/2021)   Humiliation, Afraid, Rape, and Kick questionnaire    Fear of Current or Ex-Partner: No    Emotionally Abused: No    Physically Abused: No    Sexually Abused: No    FAMILY HISTORY: Family History  Problem Relation Age of Onset   CVA Mother    Lymphoma Mother    Pneumonia Mother    CAD Father  Osteoarthritis Brother    Colon cancer Neg Hx     ALLERGIES:  is allergic to prednisone.  MEDICATIONS:  Current Outpatient Medications  Medication Sig Dispense Refill   aspirin EC 81 MG tablet Take 81 mg by mouth daily. Swallow whole.     atorvastatin (LIPITOR) 10 MG tablet TAKE 1 TABLET BY MOUTH EVERY DAY 90 tablet 0   BIOTIN 5000 PO Take 1 tablet by mouth daily.     CALCIUM-VITAMIN D PO Take 1 tablet by mouth daily.     Denosumab (XGEVA Minkler) Inject into the skin. Every 6-8 weeks.     enzalutamide (XTANDI) 40 MG tablet Take 4 tablets (160mg ) by mouth once daily as directed by physician. 120 tablet 6   ferrous sulfate 325 (65 FE) MG EC tablet TAKE 1 TABLET BY MOUTH EVERY DAY WITH BREAKFAST 90 tablet 1   fexofenadine (ALLEGRA) 180 MG tablet Take 180 mg by mouth daily.     hydrOXYzine (ATARAX/VISTARIL) 10 MG tablet Take 10 mg by mouth daily.     KLOR-CON M20 20 MEQ tablet TAKE 1 TABLET BY MOUTH TWICE A DAY 180 tablet 0   lidocaine-prilocaine (EMLA) cream Apply 1 Application topically as needed (access port). 30 g 0   LORazepam (ATIVAN) 1 MG tablet Take 1 tablet (1 mg total) by mouth every 8 (eight) hours as needed. for anxiety 90 tablet 0   Multiple Vitamin (MULTIVITAMIN) tablet Take 1 tablet by mouth daily.     olmesartan (BENICAR) 20 MG tablet Take 1 tablet (20 mg total) by mouth daily. 90 tablet 1   omeprazole (PRILOSEC) 20 MG  capsule TAKE 1 CAPSULE BY MOUTH DAILY AS NEEDED. PLEASE SCHEDULE A YEARLY FOLLOW UP FOR ADDITIONAL REFILLS 30 capsule 1   zinc gluconate 50 MG tablet Take 1 tablet (50 mg total) by mouth daily. (Patient not taking: Reported on 10/15/2022) 90 tablet 1   No current facility-administered medications for this visit.    REVIEW OF SYSTEMS:   Constitutional: ( - ) fevers, ( - )  chills , ( - ) night sweats Eyes: ( - ) blurriness of vision, ( - ) double vision, ( - ) watery eyes Ears, nose, mouth, throat, and face: ( - ) mucositis, ( - ) sore throat Respiratory: ( - ) cough, ( - ) dyspnea, ( - ) wheezes Cardiovascular: ( - ) palpitation, ( - ) chest discomfort, ( - ) lower extremity swelling Gastrointestinal:  ( - ) nausea, ( - ) heartburn, ( - ) change in bowel habits Skin: ( - ) abnormal skin rashes Lymphatics: ( - ) new lymphadenopathy, ( - ) easy bruising Neurological: ( - ) numbness, ( - ) tingling, ( - ) new weaknesses Behavioral/Psych: ( - ) mood change, ( - ) new changes  All other systems were reviewed with the patient and are negative.  PHYSICAL EXAMINATION: ECOG PERFORMANCE STATUS: 0 - Asymptomatic  Vitals:   03/20/23 1154  BP: 139/85  Pulse: 77  Resp: 14  Temp: 97.9 F (36.6 C)  SpO2: 100%    Filed Weights   03/20/23 1154  Weight: 191 lb 11.2 oz (87 kg)     GENERAL: Well-appearing elderly Caucasian male, alert, no distress and comfortable SKIN: skin color, texture, turgor are normal, no rashes or significant lesions EYES: conjunctiva are pink and non-injected, sclera clear LUNGS: clear to auscultation and percussion with normal breathing effort HEART: regular rate & rhythm and no murmurs and no lower extremity edema Musculoskeletal: no cyanosis  of digits and no clubbing  PSYCH: alert & oriented x 3, fluent speech NEURO: no focal motor/sensory deficits  LABORATORY DATA:  I have reviewed the data as listed    Latest Ref Rng & Units 03/18/2023    2:54 PM 02/13/2023     2:53 PM 01/16/2023    2:56 PM  CBC  WBC 4.0 - 10.5 K/uL 5.5  6.6  5.8   Hemoglobin 13.0 - 17.0 g/dL 16.1  09.6  04.5   Hematocrit 39.0 - 52.0 % 35.4  38.1  35.9   Platelets 150 - 400 K/uL 253  267  240        Latest Ref Rng & Units 03/18/2023    2:54 PM 02/13/2023    2:53 PM 01/16/2023    2:56 PM  CMP  Glucose 70 - 99 mg/dL 409  811  914   BUN 8 - 23 mg/dL 23  30  25    Creatinine 0.61 - 1.24 mg/dL 7.82  9.56  2.13   Sodium 135 - 145 mmol/L 137  137  136   Potassium 3.5 - 5.1 mmol/L 4.4  4.7  4.2   Chloride 98 - 111 mmol/L 104  102  104   CO2 22 - 32 mmol/L 28  26  26    Calcium 8.9 - 10.3 mg/dL 9.7  9.6  9.1   Total Protein 6.5 - 8.1 g/dL 6.7  6.9  6.7   Total Bilirubin 0.0 - 1.2 mg/dL 0.4  0.4  0.3   Alkaline Phos 38 - 126 U/L 47  53  57   AST 15 - 41 U/L 16  18  16    ALT 0 - 44 U/L 11  12  11      RADIOGRAPHIC STUDIES: No results found.  ASSESSMENT & PLAN Philip [name] is a 78 y.o. 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  # Castrate Resistant Advanced Prostate Cancer # Metastatic Prostate Cancer to Lymph Nodes/Bone  --patient is s/p orchiectomy, no ADT therapy required --Most recent PSA level from 09/16/2022 was <0.1.  --continue Xtandi 160 mg PO daily -- last CT chest abdomen pelvis showed no evidence of disease progression --Labs today show white blood cell count 5.5, hemoglobin 1.9, MCV 93.2, platelets 253 --Continue with monthly Xgeva shots with labs. RTC in 3 months for toxicity check.   #Cirrhotic Morphology of Liver on CT scan -- Found incidentally, will send message to patient's GI provider alerting him for this finding.  No orders of the defined types were placed in this encounter.   All questions were answered. The patient knows to call the clinic with any problems, questions or concerns.  I have spent a total of 30 minutes  minutes of face-to-face and non-face-to-face time, preparing to see the patient,  performing a medically appropriate examination, counseling and educating the patient, documenting clinical information in the electronic health record, independently interpreting results and communicating results to the patient, and care coordination.   Philip Barns, MD Department of Hematology/Oncology [facility] Phone: [phone] Pager: [phone] Email: [email or web address]   03/24/2023 4:03 PM

## 2023-03-24 ENCOUNTER — Encounter: Payer: Self-pay | Admitting: Hematology and Oncology

## 2023-03-24 ENCOUNTER — Telehealth: Payer: Self-pay

## 2023-03-24 NOTE — Telephone Encounter (Signed)
-----   Message from Benancio Deeds sent at 03/24/2023  4:35 PM EST ----- Thanks for letting me know.  This appears new on imaging, suggest the possibility but not diagnostic. We will get him a follow-up visit with me to discuss further.  Zakhi Dupre can you please help schedule this patient for a follow-up visit with me or APP in the upcoming few weeks?  Thanks ----- Message ----- From: Jaci Standard, MD Sent: 03/24/2023   4:02 PM EST To: Benancio Deeds, MD  Dr. Adela Lank,  I saw our mutual patient Philip [name] in clinic the other day.  He had a CT scan as part of his routine follow-up for prostate cancer which noted for the first time coarse contour of the liver, suggestive of cirrhosis.  I wanted to bring this to your attention to see if further workup is required.  The patient was concerned about the findings on the CT scan.  Best,  Vonna Kotyk

## 2023-03-25 ENCOUNTER — Other Ambulatory Visit: Payer: Self-pay

## 2023-03-25 NOTE — Progress Notes (Unsigned)
HPI :  78 year old male with a history of metastatic prostate cancer, history of sigmoid volvulus, colon polyps, GERD, here for follow-up visit regarding abnormal imaging of his liver.  He is followed by Dr. Leonides Schanz for metastatic prostate cancer, on Xtandi.  He has been dealing with this for several years however this has been stable and has not progressed for some time.  He had surveillance imaging with a CT scan this past November which suggested some coarseness of the liver suggestive of cirrhosis.  The spleen was normal.  We reviewed this for some time today.  He has no known history of liver disease.  No family history of known liver disease.  He has had numerous imaging studies with CT scans dating back several years and none of them have shown any problems with his liver.  He denies any jaundice, no ascites, no history of GI bleeding, no history of hepatic encephalopathy.  He is quite concerned about the imaging findings.  He has occasional wine with dinner, perhaps 1 or 2 glasses at most and does not drink every day.  About 10 years ago he was drinking roughly 2 scotch per day.  Prior imaging did not show any steatosis of his liver.  His liver enzymes have historically been normal.  Otherwise he does have some reflux for which she takes omeprazole 20 mg daily.  He has had prior EGD which showed no evidence of Barrett's.  He has symptoms that bother him if he does not take it on a daily basis.  If he does take omeprazole daily controlled pretty well for him.  He is happy with the regimen.  Recall he also had a few hospitalizations for recurrent sigmoid volvulus in 2023.  We decompressed him and manage conservatively in January.  He got better.  Unfortunately he declined surgery at that time and had another recurrence in April.  After the recurrence which led to admission, he had sigmoid resection and has not had any recurrence since that time and doing well.  Finally he has had some generalized  fatigue as well as loss of appetite for several months.  He is on Xtandi for his prostate cancer and wonders if related.  He denies any nausea or vomiting.  In looking through his chart he is actually up a few pounds compared to over a year ago   Prior workup: Flex sig 03/07/21: Decompression of sigmoid volvulus   EGD 04/17/21: - Esophagogastric landmarks were identified: the Z-line was found at 42 cm, the gastroesophageal junction was found at 42 cm and the upper extent of the gastric folds was found at 44 cm from the incisors. Z line slightly irregular but did not meet criteria for Barrett's. Findings: - A 2 cm hiatal hernia was present. - LA Grade A esophagitis with no bleeding was found. - The exam of the esophagus was otherwise normal. - The entire examined stomach was normal. Biopsies were taken with a cold forceps for Helicobacter pylori testing. - The duodenal bulb and second portion of the duodenum were normal.  Colonoscopy 04/17/21: - The perianal and digital rectal examinations were normal. - The terminal ileum appeared normal. - A 3 mm polyp was found in the ascending colon. The polyp was sessile. The polyp was removed with a cold snare. Resection and retrieval were complete. - A 5 mm polyp was found in the recto-sigmoid colon. The polyp was sessile. The polyp was removed with a cold snare. Resection and retrieval were complete. Surprisingly  there was persistent oozing at the site after monitoring for a few minutes, three hemostatic clips ultimately were successfully placed to achieve hemostasis. - The colon was extremely long and redundant with significant looping. Abdominal pressure applied to achieve cecal intubation which was quite challenging. There was also residual stool throughout the colon which took some time to lavage. - Internal hemorrhoids were found during retroflexion. - The exam was otherwise without abnormality.  1. Surgical [P], gastric antrum and gastric body - UNREMARKABLE  ANTRAL AND OXYNTIC MUCOSA. - NO HELICOBACTER PYLORI IDENTIFIED. 2. Surgical [P], colon, ascending, sigmoid, polyp (2) - TUBULAR ADENOMA (1) WITHOUT HIGH GRADE DYSPLASIA. - HYPERPLASTIC POLYP (1).   Flex sig 06/07/21: Decompression of sigmoid volvulus   Echo 07/24/22: EF 55-60%,    CT C/A/P 01/22/23: IMPRESSION: 1. Unchanged diffusely sclerotic osseous metastatic disease throughout the axial and proximal appendicular skeleton. 2. No evidence of lymphadenopathy or soft tissue metastatic disease in the chest, abdomen, or pelvis. 3. Coarse contour of the liver, suggestive of cirrhosis. Correlate with biochemical findings. 4. Coronary artery disease.    Past Medical History:  Diagnosis Date   Atherosclerosis of aorta (HCC) 03/11/2017   The 10-year ASCVD risk score Denman George DC Jr., et al., 2013) is: 24.6%   Values used to calculate the score:     Age: 4 years     Sex: Male     Is Non-Hispanic African American: No     Diabetic: No     Tobacco smoker: No     Systolic Blood Pressure: 154 mmHg     Is BP treated: No     HDL Cholesterol: 71.1 mg/dL     Total Cholesterol: 212 mg/dL   Cancer (HCC)    PROSTATE   Chronic idiopathic constipation 03/11/2017   Edema leg Sept 28, 2016   left leg from foot to thigh, increasinly worse over last 4 weeks   Essential hypertension 03/30/2018   Gastroesophageal reflux disease without esophagitis 03/11/2017   Hyperlipidemia LDL goal <100 03/16/2017   The 10-year ASCVD risk score Denman George DC Jr., et al., 2013) is: 30.6%   Values used to calculate the score:     Age: 59 years     Sex: Male     Is Non-Hispanic African American: No     Diabetic: No     Tobacco smoker: No     Systolic Blood Pressure: 148 mmHg     Is BP treated: Yes     HDL Cholesterol: 69.3 mg/dL     Total Cholesterol: 211 mg/dL   Hypertension    Hypoglycemia    Swelling LAST 30 DAYS [facility] AWARE   LEFT LEG AND FOOT     Past Surgical History:  Procedure Laterality Date   BOWEL DECOMPRESSION N/A  03/07/2021   Procedure: BOWEL DECOMPRESSION;  Surgeon: Benancio Deeds, MD;  Location: [facility];  Service: Gastroenterology;  Laterality: N/A;   BOWEL DECOMPRESSION N/A 06/07/2021   Procedure: BOWEL DECOMPRESSION;  Surgeon: Jeani Hawking, MD;  Location: [facility];  Service: Gastroenterology;  Laterality: N/A;   FLEXIBLE SIGMOIDOSCOPY N/A 03/07/2021   Procedure: FLEXIBLE SIGMOIDOSCOPY;  Surgeon: Benancio Deeds, MD;  Location: [facility];  Service: Gastroenterology;  Laterality: N/A;   FLEXIBLE SIGMOIDOSCOPY N/A 06/07/2021   Procedure: FLEXIBLE SIGMOIDOSCOPY;  Surgeon: Jeani Hawking, MD;  Location: [facility];  Service: Gastroenterology;  Laterality: N/A;   GUM SURGERY     TEETH IMPLANTS ALSO   LAPAROSCOPIC SIGMOID COLECTOMY N/A 06/10/2021   Procedure: LAPAROSCOPIC ASSISTED  SIGMOID COLECTOMY;  Surgeon: Luretha Murphy, MD;  Location: [facility];  Service: General;  Laterality: N/A;   ORCHIECTOMY Bilateral 01/03/2015   Procedure: ORCHIECTOMY;  Surgeon: Sebastian Ache, MD;  Location: [facility];  Service: Urology;  Laterality: Bilateral;   Family History  Problem Relation Age of Onset   CVA Mother    Lymphoma Mother    Pneumonia Mother    CAD Father    Osteoarthritis Brother    Colon cancer Neg Hx    Social History   Tobacco Use   Smoking status: Never   Smokeless tobacco: Never  Vaping Use   Vaping status: Never Used  Substance Use Topics   Alcohol use: Yes    Alcohol/week: 1.0 standard drink of alcohol    Types: 1 Glasses of wine per week    Comment: occasional wine with dinner   Drug use: No   Current Outpatient Medications  Medication Sig Dispense Refill   aspirin EC 81 MG tablet Take 81 mg by mouth daily. Swallow whole.     atorvastatin (LIPITOR) 10 MG tablet TAKE 1 TABLET BY MOUTH EVERY DAY 90 tablet 0   BIOTIN 5000 PO Take 1 tablet by mouth daily.     CALCIUM-VITAMIN D PO Take 1 tablet by mouth daily.     Denosumab (XGEVA ) Inject into the skin. Every 6-8 weeks.      enzalutamide (XTANDI) 40 MG tablet Take 4 tablets (160mg ) by mouth once daily as directed by physician. 120 tablet 6   ferrous sulfate 325 (65 FE) MG EC tablet TAKE 1 TABLET BY MOUTH EVERY DAY WITH BREAKFAST 90 tablet 1   fexofenadine (ALLEGRA) 180 MG tablet Take 180 mg by mouth daily.     hydrOXYzine (ATARAX/VISTARIL) 10 MG tablet Take 10 mg by mouth daily.     KLOR-CON M20 20 MEQ tablet TAKE 1 TABLET BY MOUTH TWICE A DAY 180 tablet 0   lidocaine-prilocaine (EMLA) cream Apply 1 Application topically as needed (access port). 30 g 0   LORazepam (ATIVAN) 1 MG tablet Take 1 tablet (1 mg total) by mouth every 8 (eight) hours as needed. for anxiety 90 tablet 0   Multiple Vitamin (MULTIVITAMIN) tablet Take 1 tablet by mouth daily.     olmesartan (BENICAR) 20 MG tablet Take 1 tablet (20 mg total) by mouth daily. 90 tablet 1   omeprazole (PRILOSEC) 20 MG capsule TAKE 1 CAPSULE BY MOUTH DAILY AS NEEDED. PLEASE SCHEDULE A YEARLY FOLLOW UP FOR ADDITIONAL REFILLS 30 capsule 1   No current facility-administered medications for this visit.   Allergies  Allergen Reactions   Prednisone Anxiety     Review of Systems: All systems reviewed and negative except where noted in HPI.   Lab Results  Component Value Date   WBC 5.5 03/18/2023   HGB 11.9 (L) 03/18/2023   HCT 35.4 (L) 03/18/2023   MCV 93.2 03/18/2023   PLT 253 03/18/2023    Lab Results  Component Value Date   NA 137 03/18/2023   CL 104 03/18/2023   K 4.4 03/18/2023   CO2 28 03/18/2023   BUN 23 03/18/2023   CREATININE 1.17 03/18/2023   GFRNONAA >60 03/18/2023   CALCIUM 9.7 03/18/2023   PHOS 2.6 03/08/2021   ALBUMIN 4.0 03/18/2023   GLUCOSE 108 (H) 03/18/2023    Lab Results  Component Value Date   ALT 11 03/18/2023   AST 16 03/18/2023   ALKPHOS 47 03/18/2023   BILITOT 0.4 03/18/2023    Lab Results  Component Value Date   INR 1.06 02/09/2015   INR 1.10 12/27/2014     Physical Exam: There were no vitals taken for this  visit. Constitutional: Pleasant,well-developed, male in no acute distress. Neurological: Alert and oriented to person place and time. Psychiatric: Normal mood and affect. Behavior is normal.   ASSESSMENT: 78 y.o. male here for assessment of the following  1. Abnormal finding on imaging of liver   2. Gastroesophageal reflux disease, unspecified whether esophagitis present   3. Loss of appetite   4. Sigmoid volvulus (HCC)    As above, history of metastatic prostate cancer with stable disease over time, on routine surveillance imaging was noted to have a suspected coarse contour of the liver with suggestion of cirrhosis.  We discussed this at length.  His liver enzymes are normal, platelets are normal.  His spleen is normal.  Numerous prior exams have shown no concerning pathology for his liver.  I am not convinced he has cirrhosis of the liver.  If he does have cirrhosis, I would think he is well compensated and hopefully this never causes him a problem over time.  He does drink occasionally but does not endorse a history of significant alcohol use that would lead to cirrhosis.  His liver enzymes again are currently normal.  We discussed ways to further evaluate this.  Liver biopsy would be the gold standard to evaluate for cirrhosis but is the most aggressive and I do not feel strongly need to pursue that right now.  I did offer him any elastography study to intervally image his liver and assess for this.  If this study is normal then the risk for cirrhosis would be extremely low.  He did wish to start with any elastography initially.  I will also check his INR to make sure that is up-to-date and screen for hep B and C to make sure negative.  We did discuss if he does end up having cirrhosis, he would need surveillance imaging every 6 months for [facility] screening and monitor for decompensations which we discussed.  Hopefully again, he does not have cirrhosis and if he does, he appears well  compensated.  Otherwise we discussed his reflux regimen.  Prior endoscopy shows no history of Barrett's.  We discussed long-term use of chronic PPI, he is rather symptomatic if he does not take omeprazole and I think safe to continue daily for now.  He is status post resection for sigmoid volvulus and has not had recurrence.  Hopefully he does well over time without further recurrence following surgery.  Otherwise we discussed his loss of appetite and generalized fatigue are rather nonspecific.  Imaging and endoscopy recently done without concerning process.  These are listed as rather common side effects to [facility] which she has been on for 2 years and I wonder if related.  He is not experiencing weight loss based I can see comparing his weight to prior records.  We discussed if he wanted to trial appetite stimulant such as Remeron.  He wants to hold off for now monitor.  He will let me know if he wants to consider this moving forward   PLAN: - US abdomen with elastography - assess risk for fibrosis - lab today - INR, hep C antibody, hep B surface antigen - continue omeprazole daily PRN - discussed long term risks / benefits - I think he is having a side effect from [place]. Discussed options, workup to date otherwise negative. Consideration for Remeron to stimulate appetite,  he wants to hold off for now - s/p surgery for vovlulus without recurrence, he will monitor  Harlin Rain, MD [facility]enter[facility]

## 2023-03-25 NOTE — Progress Notes (Signed)
Specialty Pharmacy Ongoing Clinical Assessment Note  Philip [name] is a 78 y.o. male who is being followed by the specialty pharmacy service for [facility] Oncology   Patient's specialty medication(s) reviewed today: Enzalutamide [name]   Missed doses in the last 4 weeks: 0   Patient/Caregiver did not have any additional questions or concerns.   Patient has been on therapy for over a year.   Therapeutic benefit summary: Patient is achieving benefit (03/18/23 PSA < 0.1)   Adverse events/side effects summary: No adverse events/side effects   Patient's therapy is appropriate to: Continue    Goals Addressed             This Visit's Progress    Stabilization of disease       Patient is on track. Patient will maintain adherence         Follow up:  3 months  Bobette Mo Specialty Pharmacist

## 2023-03-25 NOTE — Progress Notes (Signed)
Specialty Pharmacy Refill Coordination Note  Philip [name] is a 78 y.o. male contacted today regarding refills of specialty medication(s) Enzalutamide [name]   Patient requested [name] at [facility] at [place] date: 04/01/23   Medication will be filled on 03/31/23.

## 2023-03-25 NOTE — Telephone Encounter (Signed)
Called and spoke with patient. Patient has been scheduled for a follow up with Dr. Adela Lank tomorrow at 9 am. Vanessa Kick, [place] notified of add on.

## 2023-03-26 ENCOUNTER — Encounter: Payer: Self-pay | Admitting: Gastroenterology

## 2023-03-26 ENCOUNTER — Other Ambulatory Visit (INDEPENDENT_AMBULATORY_CARE_PROVIDER_SITE_OTHER): Payer: Medicare Other

## 2023-03-26 ENCOUNTER — Encounter: Payer: Self-pay | Admitting: Hematology and Oncology

## 2023-03-26 ENCOUNTER — Ambulatory Visit (INDEPENDENT_AMBULATORY_CARE_PROVIDER_SITE_OTHER): Payer: Medicare Other | Admitting: Gastroenterology

## 2023-03-26 VITALS — BP 126/76 | HR 94 | Ht 76.0 in | Wt 190.5 lb

## 2023-03-26 DIAGNOSIS — K219 Gastro-esophageal reflux disease without esophagitis: Secondary | ICD-10-CM

## 2023-03-26 DIAGNOSIS — R945 Abnormal results of liver function studies: Secondary | ICD-10-CM | POA: Diagnosis not present

## 2023-03-26 DIAGNOSIS — R630 Anorexia: Secondary | ICD-10-CM

## 2023-03-26 DIAGNOSIS — K562 Volvulus: Secondary | ICD-10-CM | POA: Diagnosis not present

## 2023-03-26 DIAGNOSIS — R932 Abnormal findings on diagnostic imaging of liver and biliary tract: Secondary | ICD-10-CM

## 2023-03-26 LAB — PROTIME-INR
INR: 1 {ratio} (ref 0.8–1.0)
Prothrombin Time: 10.9 s (ref 9.6–13.1)

## 2023-03-26 NOTE — Patient Instructions (Signed)
You have been scheduled for an abdominal ultrasound at [facility] (1st floor of hospital) on Friday, 04-03-23 at 10:30am. Please arrive 30 minutes prior to your appointment for registration. Make certain not to have anything to eat or drink 6 hours prior to your appointment. Should you need to reschedule your appointment, please contact radiology at [phone]. This test typically takes about 30 minutes to perform.  Please go to the lab in the basement of our building to have lab work done as you leave today. Hit "B" for basement when you get on the elevator.  When the doors open the lab is on your left.  We will call you with the results. Thank you.  Continue omeprazole.  Thank you for entrusting me with your care and for choosing [facility], Dr. Ileene Patrick    If your blood pressure at your visit was 140/90 or greater, please contact your primary care physician to follow up on this. ______________________________________________________  If you are age 15 or older, your body mass index should be between 23-30. Your There is no height or weight on file to calculate BMI. If this is out of the aforementioned range listed, please consider follow up with your Primary Care Provider.  If you are age 38 or younger, your body mass index should be between 19-25. Your There is no height or weight on file to calculate BMI. If this is out of the aformentioned range listed, please consider follow up with your Primary Care Provider.  ________________________________________________________  The [facility] providers would like to encourage you to use [facility] to communicate with providers for non-urgent requests or questions.  Due to long hold times on the telephone, sending your provider a message by [facility] may be a faster and more efficient way to get a response.  Please allow 48 business hours for a response.  Please remember that this is for non-urgent requests.   _______________________________________________________  Due to recent changes in healthcare laws, you may see the results of your imaging and laboratory studies on MyChart before your provider has had a chance to review them.  We understand that in some cases there may be results that are confusing or concerning to you. Not all laboratory results come back in the same time frame and the provider may be waiting for multiple results in order to interpret others.  Please give [place] 48 hours in order for your provider to thoroughly review all the results before contacting the office for clarification of your results.

## 2023-03-27 ENCOUNTER — Encounter: Payer: Self-pay | Admitting: Gastroenterology

## 2023-03-27 LAB — HEPATITIS C ANTIBODY: Hepatitis C Ab: NONREACTIVE

## 2023-03-27 LAB — HEPATITIS B SURFACE ANTIGEN: Hepatitis B Surface Ag: NONREACTIVE

## 2023-03-31 ENCOUNTER — Other Ambulatory Visit: Payer: Self-pay

## 2023-04-02 ENCOUNTER — Ambulatory Visit: Payer: Medicare Other | Admitting: Internal Medicine

## 2023-04-03 ENCOUNTER — Ambulatory Visit (HOSPITAL_COMMUNITY)
Admission: RE | Admit: 2023-04-03 | Discharge: 2023-04-03 | Disposition: A | Discharge status: Home / Self Care | Payer: Medicare Other | Source: Ambulatory Visit | Attending: Gastroenterology

## 2023-04-03 DIAGNOSIS — K219 Gastro-esophageal reflux disease without esophagitis: Secondary | ICD-10-CM | POA: Insufficient documentation

## 2023-04-03 DIAGNOSIS — R630 Anorexia: Secondary | ICD-10-CM | POA: Diagnosis not present

## 2023-04-03 DIAGNOSIS — R932 Abnormal findings on diagnostic imaging of liver and biliary tract: Secondary | ICD-10-CM | POA: Diagnosis not present

## 2023-04-03 DIAGNOSIS — K562 Volvulus: Secondary | ICD-10-CM | POA: Diagnosis not present

## 2023-04-03 DIAGNOSIS — K7689 Other specified diseases of liver: Secondary | ICD-10-CM | POA: Diagnosis not present

## 2023-04-04 ENCOUNTER — Encounter: Payer: Self-pay | Admitting: Gastroenterology

## 2023-04-08 ENCOUNTER — Ambulatory Visit: Payer: Medicare Other | Admitting: Internal Medicine

## 2023-04-10 ENCOUNTER — Inpatient Hospital Stay: Payer: Medicare Other

## 2023-04-13 ENCOUNTER — Inpatient Hospital Stay: Payer: Medicare Other

## 2023-04-21 ENCOUNTER — Other Ambulatory Visit: Payer: Self-pay

## 2023-04-21 ENCOUNTER — Encounter: Payer: Self-pay | Admitting: Gastroenterology

## 2023-04-21 MED ORDER — OMEPRAZOLE 20 MG PO CPDR: 20.0000 mg | DELAYED_RELEASE_CAPSULE | Freq: Every day | ORAL | 1 refills | Status: DC

## 2023-04-21 NOTE — Progress Notes (Signed)
Refill of omeprazole.

## 2023-04-22 ENCOUNTER — Other Ambulatory Visit: Payer: Self-pay

## 2023-04-22 NOTE — Progress Notes (Signed)
Specialty Pharmacy Refill Coordination Note  Fidencio Duddy is a 78 y.o. male contacted today regarding refills of specialty medication(s) Enzalutamide [name]   Patient requested [name] at [facility] at [place] date: 04/29/23   Medication will be filled on 04/28/2023.

## 2023-04-24 ENCOUNTER — Ambulatory Visit: Payer: Medicare Other

## 2023-04-24 DIAGNOSIS — Z0000 Encounter for general adult medical examination without abnormal findings: Secondary | ICD-10-CM | POA: Diagnosis not present

## 2023-04-24 NOTE — Progress Notes (Addendum)
Subjective:   Philip [name] is a 78 y.o. who presents for a Medicare Wellness preventive visit.  Visit Complete: Virtual I connected with  Philip [name] on 04/24/23 by a audio enabled telemedicine application and verified that I am speaking with the correct person using two identifiers.  Patient Location: Home  Provider Location: Home Office  I discussed the limitations of evaluation and management by telemedicine. The patient expressed understanding and agreed to proceed.  Vital Signs: Because this visit was a virtual/telehealth visit, some criteria may be missing or patient reported. Any vitals not documented were not able to be obtained and vitals that have been documented are patient reported.  VideoError- Librarian, academic were attempted between this provider and patient, however failed, due to patient having technical difficulties OR patient did not have access to video capability.  We continued and completed visit with audio only.   AWV Questionnaire: Yes: Patient Medicare AWV questionnaire was completed by the patient on 04/21/2023; I have confirmed that all information answered by patient is correct and no changes since this date.  Cardiac Risk Factors include: advanced age (>39men, >56 women);dyslipidemia;hypertension;male gender     Objective:    Today's Vitals   04/24/23 0925  PainSc: 5    There is no height or weight on file to calculate BMI.     04/24/2023    9:35 AM 01/01/2022    9:45 AM 12/04/2021    9:52 AM 06/07/2021    1:41 PM 03/08/2021    4:00 PM 03/07/2021    1:15 PM 11/19/2020   10:31 AM  Advanced Directives  Does Patient Have a Medical Advance Directive? Yes Yes Yes No Yes Yes Yes  Type of Estate agent of [employer] Power of [place];Living will Healthcare Power of [place];Living will  Healthcare Power of [place];Living will Healthcare Power of [place];Living will Living will  Does patient want to make  changes to medical advance directive?  No - Patient declined   No - Patient declined  No - Patient declined  Copy of Healthcare Power of Attorney in Chart? No - copy requested No - copy requested No - copy requested      Would patient like information on creating a medical advance directive?    No - Patient declined       Current Medications (verified) Outpatient Encounter Medications as of 04/24/2023  Medication Sig   aspirin EC 81 MG tablet Take 81 mg by mouth daily. Swallow whole.   atorvastatin (LIPITOR) 10 MG tablet TAKE 1 TABLET BY MOUTH EVERY DAY   BIOTIN 5000 PO Take 1 tablet by mouth daily.   CALCIUM-VITAMIN D PO Take 1 tablet by mouth daily.   cyanocobalamin (VITAMIN B12) 1000 MCG tablet Take 1,000 mcg by mouth daily.   Denosumab (XGEVA [place]) Inject into the skin. Every 6-8 weeks.   enzalutamide (XTANDI) 40 MG tablet Take 4 tablets (160mg ) by mouth once daily as directed by physician.   ferrous sulfate 325 (65 FE) MG EC tablet TAKE 1 TABLET BY MOUTH EVERY DAY WITH BREAKFAST   fexofenadine (ALLEGRA) 180 MG tablet Take 180 mg by mouth daily.   hydrOXYzine (ATARAX/VISTARIL) 10 MG tablet Take 10 mg by mouth daily.   KLOR-CON M20 20 MEQ tablet TAKE 1 TABLET BY MOUTH TWICE A DAY   lidocaine-prilocaine (EMLA) cream Apply 1 Application topically as needed (access port).   LORazepam (ATIVAN) 1 MG tablet Take 1 tablet (1 mg total) by mouth every 8 (eight)  hours as needed. for anxiety   Multiple Vitamin (MULTIVITAMIN) tablet Take 1 tablet by mouth daily.   olmesartan (BENICAR) 20 MG tablet Take 1 tablet (20 mg total) by mouth daily.   omeprazole (PRILOSEC) 20 MG capsule Take 1 capsule (20 mg total) by mouth daily.   [DISCONTINUED] azelastine (ASTELIN) 0.1 % nasal spray PLACE 1 SPRAY INTO BOTH NOSTRILS 2 (TWO) TIMES DAILY. USE IN EACH NOSTRIL AS DIRECTED   No facility-administered encounter medications on file as of 04/24/2023.    Allergies (verified) Prednisone   History: Past Medical  History:  Diagnosis Date   Allergy    Anxiety    Arthritis    Atherosclerosis of aorta (HCC) 03/11/2017   The 10-year ASCVD risk score Denman George DC Jr., et al., 2013) is: 24.6%   Values used to calculate the score:     Age: 78 years     Sex: Male     Is Non-Hispanic African American: No     Diabetic: No     Tobacco smoker: No     Systolic Blood Pressure: 154 mmHg     Is BP treated: No     HDL Cholesterol: 71.1 mg/dL     Total Cholesterol: 212 mg/dL   Cancer (HCC)    PROSTATE   Cataract    Chronic idiopathic constipation 03/11/2017   Depression    Edema leg 11/29/2014   left leg from foot to thigh, increasinly worse over last 4 weeks   Essential hypertension 03/30/2018   Gastroesophageal reflux disease without esophagitis 03/11/2017   Hyperlipidemia LDL goal <100 03/16/2017   The 10-year ASCVD risk score Denman George DC Jr., et al., 2013) is: 30.6%   Values used to calculate the score:     Age: 71 years     Sex: Male     Is Non-Hispanic African American: No     Diabetic: No     Tobacco smoker: No     Systolic Blood Pressure: 148 mmHg     Is BP treated: Yes     HDL Cholesterol: 69.3 mg/dL     Total Cholesterol: 211 mg/dL   Hypertension    Hypoglycemia    Swelling LAST 30 DAYS [facility] AWARE   LEFT LEG AND FOOT   Past Surgical History:  Procedure Laterality Date   BOWEL DECOMPRESSION N/A 03/07/2021   Procedure: BOWEL DECOMPRESSION;  Surgeon: Benancio Deeds, MD;  Location: [facility];  Service: Gastroenterology;  Laterality: N/A;   BOWEL DECOMPRESSION N/A 06/07/2021   Procedure: BOWEL DECOMPRESSION;  Surgeon: Jeani Hawking, MD;  Location: [facility];  Service: Gastroenterology;  Laterality: N/A;   COLON SURGERY     FLEXIBLE SIGMOIDOSCOPY N/A 03/07/2021   Procedure: FLEXIBLE SIGMOIDOSCOPY;  Surgeon: Benancio Deeds, MD;  Location: [facility];  Service: Gastroenterology;  Laterality: N/A;   FLEXIBLE SIGMOIDOSCOPY N/A 06/07/2021   Procedure: FLEXIBLE SIGMOIDOSCOPY;  Surgeon: Jeani Hawking, MD;  Location: [facility];  Service: Gastroenterology;  Laterality: N/A;   GUM SURGERY     TEETH IMPLANTS ALSO   LAPAROSCOPIC SIGMOID COLECTOMY N/A 06/10/2021   Procedure: LAPAROSCOPIC ASSISTED SIGMOID COLECTOMY;  Surgeon: Luretha Murphy, MD;  Location: [facility];  Service: General;  Laterality: N/A;   ORCHIECTOMY Bilateral 01/03/2015   Procedure: ORCHIECTOMY;  Surgeon: Sebastian Ache, MD;  Location: [facility];  Service: Urology;  Laterality: Bilateral;   Family History  Problem Relation Age of Onset   CVA Mother    Lymphoma Mother    Pneumonia Mother    CAD  Father    Osteoarthritis Brother    Colon cancer Neg Hx    Social History   Socioeconomic History   Marital status: Married    Spouse name: Not on file   Number of children: Not on file   Years of education: Not on file   Highest education level: Bachelor's degree (e.g., BA, AB, BS)  Occupational History   Not on file  Tobacco Use   Smoking status: Never   Smokeless tobacco: Never  Vaping Use   Vaping status: Never Used  Substance and Sexual Activity   Alcohol use: Yes    Alcohol/week: 1.0 standard drink of alcohol    Types: 1 Glasses of wine per week    Comment: occasional wine with dinner   Drug use: No   Sexual activity: Not Currently    Birth control/protection: None  Other Topics Concern   Not on file  Social History Narrative   Not on file   Social Drivers of Health   Financial Resource Strain: Low Risk  (04/21/2023)   Overall Financial Resource Strain (CARDIA)    Difficulty of Paying Living Expenses: Not hard at all  Food Insecurity: No Food Insecurity (04/21/2023)   Hunger Vital Sign    Worried About Running Out of Food in the Last Year: Never true    Ran Out of Food in the Last Year: Never true  Transportation Needs: No Transportation Needs (04/21/2023)   PRAPARE - Administrator, Civil Service (Medical): No    Lack of Transportation (Non-Medical): No  Physical Activity:  Insufficiently Active (04/21/2023)   Exercise Vital Sign    Days of Exercise per Week: 7 days    Minutes of Exercise per Session: 20 min  Stress: Stress Concern Present (04/21/2023)   [employer] of Occupational Health - Occupational Stress Questionnaire    Feeling of Stress : Rather much  Social Connections: Socially Isolated (04/21/2023)   Social Connection and Isolation Panel [NHANES]    Frequency of Communication with Friends and Family: Never    Frequency of Social Gatherings with Friends and Family: Never    Attends Religious Services: Never    Database administrator or Organizations: No    Attends Engineer, structural: Not on file    Marital Status: Married    Tobacco Counseling Counseling given: Not Answered    Clinical Intake:  Pre-visit preparation completed: Yes  Pain : 0-10 Pain Score: 5  Pain Type: Acute pain Pain Location: Leg Pain Orientation: Left Pain Descriptors / Indicators: Aching Pain Onset: More than a month ago Pain Frequency: Intermittent     Nutritional Risks: None Diabetes: No  How often do you need to have someone help you when you read instructions, pamphlets, or other written materials from your doctor or pharmacy?: 1 - Never  Interpreter Needed?: No  Information entered by :: NAllen LPN   Activities of Daily Living     04/24/2023    9:27 AM  In your present state of health, do you have any difficulty performing the following activities:  Hearing? 0  Comment slight hearing right ear, but no need for hearing aid  Vision? 0  Difficulty concentrating or making decisions? 0  Walking or climbing stairs? 0  Dressing or bathing? 0  Doing errands, shopping? 0  Preparing Food and eating ? N  Using the Toilet? N  In the past six months, have you accidently leaked urine? N  Do you have problems with loss of  bowel control? N  Managing your Medications? N  Managing your Finances? N  Housekeeping or managing your Housekeeping?  N    Patient Care Team: Etta Grandchild, MD as PCP - General (Internal Medicine) Hurshel Party, OD as Consulting Physician (Optometry) Berneice Heinrich, Delbert Phenix., MD as Consulting Physician (Urology) Benjiman Core, MD (Inactive) as Consulting Physician (Oncology)  Indicate any recent Medical Services you may have received from other than [facility] providers in the past year (date may be approximate).     Assessment:   This is a routine wellness examination for Christia.  Hearing/Vision screen Hearing Screening - Comments:: Slight hearing issue that does not require hearing aids Vision Screening - Comments:: Regular eye exams, [facility], Dr. Cephus Richer   Goals Addressed             This Visit's Progress    Patient Stated       04/24/2023, wants to exercise more and build up strength and stamina       Depression Screen     04/24/2023    9:38 AM 12/04/2021    9:57 AM 11/19/2020   10:29 AM 12/27/2019    9:35 AM 03/30/2018    9:20 AM 03/29/2018    2:07 PM  PHQ 2/9 Scores  PHQ - 2 Score 2 0 0 0 0 0  PHQ- 9 Score 6         Fall Risk     04/24/2023    9:37 AM 12/04/2021    9:53 AM 11/19/2020   10:34 AM 05/09/2019   10:38 AM 03/30/2018    9:20 AM  Fall Risk   Falls in the past year? 0 0 0 0 0  Number falls in past yr: 0 0 0 0   Injury with Fall? 0 0 0 0   Risk for fall due to : Medication side effect No Fall Risks No Fall Risks    Follow up Falls prevention discussed;Falls evaluation completed Falls prevention discussed Falls evaluation completed      MEDICARE RISK AT HOME:  Medicare Risk at Home Any stairs in or around the home?: Yes If so, are there any without handrails?: No Home free of loose throw rugs in walkways, pet beds, electrical cords, etc?: Yes Adequate lighting in your home to reduce risk of falls?: Yes Life alert?: No Use of a cane, walker or w/c?: No Grab bars in the bathroom?: Yes Shower chair or bench in shower?: Yes Elevated toilet seat or a  handicapped toilet?: Yes  TIMED UP AND GO:  Was the test performed?  No  Cognitive Function: 6CIT completed        04/24/2023    9:41 AM 12/04/2021   10:08 AM  6CIT Screen  What Year? 0 points 0 points  What month? 0 points 0 points  What time? 0 points 0 points  Count back from 20 0 points 0 points  Months in reverse 0 points 0 points  Repeat phrase 0 points 0 points  Total Score 0 points 0 points    Immunizations Immunization History  Administered Date(s) Administered   Fluad Quad(high Dose 65+) 11/24/2018, 11/14/2019, 11/12/2020, 11/06/2021   Influenza, High Dose Seasonal PF 12/03/2015, 12/01/2016, 11/22/2017   Influenza-Unspecified 01/19/2015, 12/03/2015, 11/22/2017   PFIZER(Purple Top)SARS-COV-2 Vaccination 04/10/2019, 05/04/2019, 11/30/2019, 06/06/2020, 11/12/2020   Pfizer Covid-19 Vaccine Bivalent Booster 44yrs & up 07/08/2021, 12/03/2021   Pneumococcal Conjugate-13 03/11/2017   Pneumococcal Polysaccharide-23 11/14/2019   Pneumococcal-Unspecified 07/20/2012   Respiratory Syncytial  Virus Vaccine,Recomb Aduvanted(Arexvy) 11/02/2021   Tdap 07/20/2012   Unspecified SARS-COV-2 Vaccination 12/03/2021    Screening Tests Health Maintenance  Topic Date Due   Zoster Vaccines- Shingrix (1 of 2) Never done   DTaP/Tdap/Td (2 - Td or Tdap) 07/21/2022   COVID-19 Vaccine (9 - 2024-25 season) 02/09/2023   Medicare Annual Wellness (AWV)  04/23/2024   Pneumonia Vaccine 66+ Years old  Completed   INFLUENZA VACCINE  Completed   Hepatitis C Screening  Completed   HPV VACCINES  Aged Out   Fecal DNA (Cologuard)  Discontinued    Health Maintenance  Health Maintenance Due  Topic Date Due   Zoster Vaccines- Shingrix (1 of 2) Never done   DTaP/Tdap/Td (2 - Td or Tdap) 07/21/2022   COVID-19 Vaccine (9 - 2024-25 season) 02/09/2023   Health Maintenance Items Addressed: Declines shingles vaccine, checking on TDAP  Additional Screening:  Vision Screening: Recommended annual  ophthalmology exams for early detection of glaucoma and other disorders of the eye.  Dental Screening: Recommended annual dental exams for proper oral hygiene  Community Resource Referral / Chronic Care Management: CRR required this visit?  No   CCM required this visit?  No     Plan:     I have personally reviewed and noted the following in the patient's chart:   Medical and social history Use of alcohol, tobacco or illicit drugs  Current medications and supplements including opioid prescriptions. Patient is not currently taking opioid prescriptions. Functional ability and status Nutritional status Physical activity Advanced directives List of other physicians Hospitalizations, surgeries, and ER visits in previous 12 months Vitals Screenings to include cognitive, depression, and falls Referrals and appointments  In addition, I have reviewed and discussed with patient certain preventive protocols, quality metrics, and best practice recommendations. A written personalized care plan for preventive services as well as general preventive health recommendations were provided to patient.     Barb Merino, LPN   08/05/5407   After Visit Summary: (MyChart) Due to this being a telephonic visit, the after visit summary with patients personalized plan was offered to patient via MyChart   Notes: Nothing significant to report at this time.

## 2023-04-24 NOTE — Patient Instructions (Signed)
Philip [name] , Thank you for taking time to come for your Medicare Wellness Visit. I appreciate your ongoing commitment to your health goals. Please review the following plan we discussed and let me know if I can assist you in the future.   Referrals/Orders/Follow-Ups/Clinician Recommendations: none  This is a list of the screening recommended for you and due dates:  Health Maintenance  Topic Date Due   Zoster (Shingles) Vaccine (1 of 2) Never done   DTaP/Tdap/Td vaccine (2 - Td or Tdap) 07/21/2022   COVID-19 Vaccine (9 - 2024-25 season) 02/09/2023   Medicare Annual Wellness Visit  04/23/2024   Pneumonia Vaccine  Completed   Flu Shot  Completed   Hepatitis C Screening  Completed   HPV Vaccine  Aged Out   Cologuard (Stool DNA test)  Discontinued    Advanced directives: (Copy Requested) Please bring a copy of your health care power of attorney and living will to the office to be added to your chart at your convenience.  Next Medicare Annual Wellness Visit scheduled for next year: Yes  insert Preventive Care attachment Insert FALL PREVENTION attachment if needed

## 2023-04-29 ENCOUNTER — Telehealth: Payer: Self-pay | Admitting: *Deleted

## 2023-04-29 NOTE — Telephone Encounter (Signed)
 Received call from pt requesting visit in [facility] due to pain in his left leg that goes from his foot to his thigh-more painful when he stands and walks. Denies swelling or redness. He is concerned as his proaste cancer was found in his right groin lymph nodes. Advised that [facility] is closed this week as  the provider is out of town. He states the pain has been there for awhile and is ok to wait until next week. Advised we could try for Monday, 05/04/23. He prerfers around 10 am. Scheduling message sent.

## 2023-05-01 ENCOUNTER — Other Ambulatory Visit: Payer: Self-pay | Admitting: Internal Medicine

## 2023-05-01 ENCOUNTER — Inpatient Hospital Stay: Payer: Medicare Other | Attending: Oncology

## 2023-05-01 DIAGNOSIS — C7951 Secondary malignant neoplasm of bone: Secondary | ICD-10-CM | POA: Diagnosis not present

## 2023-05-01 DIAGNOSIS — C779 Secondary and unspecified malignant neoplasm of lymph node, unspecified: Secondary | ICD-10-CM | POA: Diagnosis not present

## 2023-05-01 DIAGNOSIS — E876 Hypokalemia: Secondary | ICD-10-CM

## 2023-05-01 DIAGNOSIS — Z95828 Presence of other vascular implants and grafts: Secondary | ICD-10-CM

## 2023-05-01 DIAGNOSIS — C61 Malignant neoplasm of prostate: Secondary | ICD-10-CM | POA: Diagnosis not present

## 2023-05-01 DIAGNOSIS — I10 Essential (primary) hypertension: Secondary | ICD-10-CM

## 2023-05-01 LAB — CMP (CANCER CENTER ONLY)
ALT: 11 U/L (ref 0–44)
AST: 15 U/L (ref 15–41)
Albumin: 4.1 g/dL (ref 3.5–5.0)
Alkaline Phosphatase: 50 U/L (ref 38–126)
Anion gap: 5 (ref 5–15)
BUN: 27 mg/dL — ABNORMAL HIGH (ref 8–23)
CO2: 27 mmol/L (ref 22–32)
Calcium: 9.2 mg/dL (ref 8.9–10.3)
Chloride: 104 mmol/L (ref 98–111)
Creatinine: 1.04 mg/dL (ref 0.61–1.24)
GFR, Estimated: >60 mL/min (ref >60)
Glucose, Bld: 114 mg/dL — ABNORMAL HIGH (ref 70–99)
Potassium: 4.6 mmol/L (ref 3.5–5.1)
Sodium: 136 mmol/L (ref 135–145)
Total Bilirubin: 0.3 mg/dL (ref 0.0–1.2)
Total Protein: 6.6 g/dL (ref 6.5–8.1)

## 2023-05-01 LAB — CBC WITH DIFFERENTIAL (CANCER CENTER ONLY)
Abs Immature Granulocytes: 0.01 10*3/uL (ref 0.00–0.07)
Basophils Absolute: 0 10*3/uL (ref 0.0–0.1)
Basophils Relative: 0 %
Eosinophils Absolute: 0.1 10*3/uL (ref 0.0–0.5)
Eosinophils Relative: 1 %
HCT: 36 % — ABNORMAL LOW (ref 39.0–52.0)
Hemoglobin: 11.9 g/dL — ABNORMAL LOW (ref 13.0–17.0)
Immature Granulocytes: 0 %
Lymphocytes Relative: 28 %
Lymphs Abs: 2 10*3/uL (ref 0.7–4.0)
MCH: 31.3 pg (ref 26.0–34.0)
MCHC: 33.1 g/dL (ref 30.0–36.0)
MCV: 94.7 fL (ref 80.0–100.0)
Monocytes Absolute: 0.7 10*3/uL (ref 0.1–1.0)
Monocytes Relative: 9 %
Neutro Abs: 4.3 10*3/uL (ref 1.7–7.7)
Neutrophils Relative %: 62 %
Platelet Count: 231 10*3/uL (ref 150–400)
RBC: 3.8 MIL/uL — ABNORMAL LOW (ref 4.22–5.81)
RDW: 13.2 % (ref 11.5–15.5)
WBC Count: 7 10*3/uL (ref 4.0–10.5)
nRBC: 0 % (ref 0.0–0.2)

## 2023-05-01 MED ORDER — SODIUM CHLORIDE 0.9% FLUSH
10.0000 mL | Freq: Once | INTRAVENOUS | Status: AC
  Administered 2023-05-01: 10 mL

## 2023-05-01 MED ORDER — HEPARIN SOD (PORK) LOCK FLUSH 100 UNIT/ML IV SOLN
500.0000 [IU] | Freq: Once | INTRAVENOUS | Status: AC
Start: 2023-05-01 — End: 2023-05-01
  Administered 2023-05-01: 500 [IU]

## 2023-05-02 ENCOUNTER — Other Ambulatory Visit: Payer: Self-pay | Admitting: Internal Medicine

## 2023-05-02 DIAGNOSIS — I10 Essential (primary) hypertension: Secondary | ICD-10-CM

## 2023-05-04 ENCOUNTER — Other Ambulatory Visit: Payer: Self-pay

## 2023-05-04 ENCOUNTER — Ambulatory Visit (HOSPITAL_BASED_OUTPATIENT_CLINIC_OR_DEPARTMENT_OTHER)
Admission: RE | Admit: 2023-05-04 | Discharge: 2023-05-04 | Disposition: A | Discharge status: Home / Self Care | Source: Ambulatory Visit | Attending: Physician Assistant | Admitting: Physician Assistant

## 2023-05-04 ENCOUNTER — Inpatient Hospital Stay: Payer: Medicare Other | Attending: Oncology | Admitting: Physician Assistant

## 2023-05-04 VITALS — BP 145/78 | HR 77 | Temp 97.6°F | Resp 17 | Wt 190.3 lb

## 2023-05-04 DIAGNOSIS — C7951 Secondary malignant neoplasm of bone: Secondary | ICD-10-CM | POA: Diagnosis not present

## 2023-05-04 DIAGNOSIS — M79605 Pain in left leg: Secondary | ICD-10-CM | POA: Diagnosis not present

## 2023-05-04 DIAGNOSIS — C779 Secondary and unspecified malignant neoplasm of lymph node, unspecified: Secondary | ICD-10-CM | POA: Insufficient documentation

## 2023-05-04 DIAGNOSIS — C61 Malignant neoplasm of prostate: Secondary | ICD-10-CM

## 2023-05-04 DIAGNOSIS — Z807 Family history of other malignant neoplasms of lymphoid, hematopoietic and related tissues: Secondary | ICD-10-CM | POA: Insufficient documentation

## 2023-05-04 NOTE — Progress Notes (Signed)
 Symptom Management Consult Note [facility]    Patient Care Team: Etta Grandchild, MD as PCP - General (Internal Medicine) Hurshel Party, OD as Consulting Physician (Optometry) Berneice Heinrich, Delbert Phenix., MD as Consulting Physician (Urology) Benjiman Core, MD (Inactive) as Consulting Physician (Oncology)    Name / MRN / DOBMarland [name] Davi Kroon  [id number]  01-May-1945   Date of visit: 05/04/2023   Chief Complaint/Reason for visit: left leg pain   Current Therapy: Concha Pyo  Last treatment:  Xgeva treatment 73 on 02/16/23   ASSESSMENT & PLAN: Patient is a 78 y.o. 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 Leonides Schanz.  I have viewed most recent oncology note and lab work.    #Castrate resistant advanced prostate cancer with metastatic spread to the lymph nodes and bone  - Next appointment with oncologist is 06/12/23 - Chart review shows PSA has been undetectable for at least 1 year. CT AP from 01/2023 without enlarged abdominal or pelvic lymph nodes.   #Left leg pain -Left lower extremity is swollen, unsure if worse than baseline per patient. No bony tenderness or obvious deformity.  -DVT study is negative. Patient decline muscle relaxer, will continue tylenol arthritis for symptom management. Per HPI and work up this is unlikely to be related to his history of prostate cancer. -Engaged in shared decision making with pt. He prefers to follow up with orthopedics for further evaluation as he is already an established patient at [facility]. RN will help schedule an appt  Strict ED precautions discussed should symptoms worsen.   Heme/Onc History: Oncology History   No history exists.      Interval history-: Discussed the use of AI scribe software for clinical note transcription with the patient, who gave verbal consent to proceed.   Philip [name] is a 78 y.o.  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 Patient presents unaccompanied to visit today.  He has experienced a three-week history of constant dull pain in his leg, initially thought to be a pulled quadriceps muscle. The pain originates from the lower leg and radiates upwards, exacerbated by prolonged sitting and only slightly relieved by stretching. It eases during sleep but persists throughout the day. He describes the pain as 'real painful' but not debilitating, although it slows him down. No numbness or tingling in the leg, and no shortness of breath or chest pain. He admits to chronic swelling of left leg, unsure if it is currently worse than baseline. He has tried taking tylenol with minimal relief. He denies any increase in physical activity lately, no falls. He states this pain is different compared to his chronic left knee pain. He does admit to persistent swelling in his left leg since his prostate cancer diagnosis. He is unsure if swelling is worse.  He reports swelling and redness in his foot over the last few days, which has improved with compression socks. Without the socks, the veins in his foot are not visible, and the foot becomes dark red and swollen. He reports the cut is an abrasion he sustained when walking around the house. He is unsure what he hit it on. He denies any bleeding or purulent discharge. No fevers. Denies history of blood clots.He recalls a previous workup  for vein problems in his left leg, including an ultrasound and ABI, which were normal.  ROS  All other systems are reviewed and are negative for acute change except as noted in the HPI.    Allergies  Allergen Reactions   Prednisone Anxiety     Past Medical History:  Diagnosis Date   Allergy    Anxiety    Arthritis    Atherosclerosis of aorta (HCC) 03/11/2017   The  10-year ASCVD risk score Denman George DC Jr., et al., 2013) is: 24.6%   Values used to calculate the score:     Age: 35 years     Sex: Male     Is Non-Hispanic African American: No     Diabetic: No     Tobacco smoker: No     Systolic Blood Pressure: 154 mmHg     Is BP treated: No     HDL Cholesterol: 71.1 mg/dL     Total Cholesterol: 212 mg/dL   Cancer (HCC)    PROSTATE   Cataract    Chronic idiopathic constipation 03/11/2017   Depression    Edema leg 11/29/2014   left leg from foot to thigh, increasinly worse over last 4 weeks   Essential hypertension 03/30/2018   Gastroesophageal reflux disease without esophagitis 03/11/2017   Hyperlipidemia LDL goal <100 03/16/2017   The 10-year ASCVD risk score Denman George DC Jr., et al., 2013) is: 30.6%   Values used to calculate the score:     Age: 28 years     Sex: Male     Is Non-Hispanic African American: No     Diabetic: No     Tobacco smoker: No     Systolic Blood Pressure: 148 mmHg     Is BP treated: Yes     HDL Cholesterol: 69.3 mg/dL     Total Cholesterol: 211 mg/dL   Hypertension    Hypoglycemia    Swelling LAST 30 DAYS DR [facility] AWARE   LEFT LEG AND FOOT     Past Surgical History:  Procedure Laterality Date   BOWEL DECOMPRESSION N/A 03/07/2021   Procedure: BOWEL DECOMPRESSION;  Surgeon: Benancio Deeds, MD;  Location: [facility];  Service: Gastroenterology;  Laterality: N/A;   BOWEL DECOMPRESSION N/A 06/07/2021   Procedure: BOWEL DECOMPRESSION;  Surgeon: Jeani Hawking, MD;  Location: [facility];  Service: Gastroenterology;  Laterality: N/A;   COLON SURGERY     FLEXIBLE SIGMOIDOSCOPY N/A 03/07/2021   Procedure: FLEXIBLE SIGMOIDOSCOPY;  Surgeon: Benancio Deeds, MD;  Location: [facility];  Service: Gastroenterology;  Laterality: N/A;   FLEXIBLE SIGMOIDOSCOPY N/A 06/07/2021   Procedure: FLEXIBLE SIGMOIDOSCOPY;  Surgeon: Jeani Hawking, MD;  Location: [facility];  Service: Gastroenterology;  Laterality: N/A;   GUM SURGERY     TEETH  IMPLANTS ALSO   LAPAROSCOPIC SIGMOID COLECTOMY N/A 06/10/2021   Procedure: LAPAROSCOPIC ASSISTED SIGMOID COLECTOMY;  Surgeon: Luretha Murphy, MD;  Location: [facility];  Service: General;  Laterality: N/A;   ORCHIECTOMY Bilateral 01/03/2015   Procedure: ORCHIECTOMY;  Surgeon: Sebastian Ache, MD;  Location: [facility];  Service: Urology;  Laterality: Bilateral;    Social History   Socioeconomic History   Marital status: Married    Spouse name: Not on file   Number of children: Not on file   Years of education: Not on file   Highest education level: Bachelor's degree (e.g., BA, AB, BS)  Occupational History   Not on file  Tobacco Use   Smoking status: Never   Smokeless tobacco: Never  Vaping Use   Vaping status: Never Used  Substance and Sexual Activity   Alcohol use: Yes    Alcohol/week: 1.0 standard drink of alcohol    Types: 1 Glasses of wine per week    Comment: occasional wine with dinner   Drug use: No   Sexual activity: Not Currently    Birth control/protection: None  Other Topics Concern   Not on file  Social History Narrative   Not on file   Social Drivers of Health   Financial Resource Strain: Low Risk  (04/21/2023)   Overall Financial Resource Strain (CARDIA)    Difficulty of Paying Living Expenses: Not hard at all  Food Insecurity: No Food Insecurity (04/21/2023)   Hunger Vital Sign    Worried About Running Out of Food in the Last Year: Never true    Ran Out of Food in the Last Year: Never true  Transportation Needs: No Transportation Needs (04/21/2023)   PRAPARE - Administrator, Civil Service (Medical): No    Lack of Transportation (Non-Medical): No  Physical Activity: Insufficiently Active (04/21/2023)   Exercise Vital Sign    Days of Exercise per Week: 7 days    Minutes of Exercise per Session: 20 min  Stress: Stress Concern Present (04/21/2023)   [employer] of Occupational Health - Occupational Stress Questionnaire    Feeling of Stress :  Rather much  Social Connections: Socially Isolated (04/21/2023)   Social Connection and Isolation Panel [NHANES]    Frequency of Communication with Friends and Family: Never    Frequency of Social Gatherings with Friends and Family: Never    Attends Religious Services: Never    Database administrator or Organizations: No    Attends Engineer, structural: Not on file    Marital Status: Married  Catering manager Violence: Not At Risk (04/24/2023)   Humiliation, Afraid, Rape, and Kick questionnaire    Fear of Current or Ex-Partner: No    Emotionally Abused: No    Physically Abused: No    Sexually Abused: No    Family History  Problem Relation Age of Onset   CVA Mother    Lymphoma Mother    Pneumonia Mother    CAD Father    Osteoarthritis Brother    Colon cancer Neg Hx      Current Outpatient Medications:    aspirin EC 81 MG tablet, Take 81 mg by mouth daily. Swallow whole., Disp: , Rfl:    atorvastatin (LIPITOR) 10 MG tablet, TAKE 1 TABLET BY MOUTH EVERY DAY, Disp: 90 tablet, Rfl: 0   BIOTIN 5000 PO, Take 1 tablet by mouth daily., Disp: , Rfl:    CALCIUM-VITAMIN D PO, Take 1 tablet by mouth daily., Disp: , Rfl:    cyanocobalamin (VITAMIN B12) 1000 MCG tablet, Take 1,000 mcg by mouth daily., Disp: , Rfl:    Denosumab (XGEVA [place]), Inject into the skin. Every 6-8 weeks., Disp: , Rfl:    enzalutamide (XTANDI) 40 MG tablet, Take 4 tablets (160mg ) by mouth once daily as directed by physician., Disp: 120 tablet, Rfl: 6   ferrous sulfate 325 (65 FE) MG EC tablet, TAKE 1 TABLET BY MOUTH EVERY DAY WITH BREAKFAST, Disp: 90 tablet, Rfl: 1   fexofenadine (ALLEGRA) 180 MG tablet, Take 180 mg by mouth daily., Disp: , Rfl:    hydrOXYzine (ATARAX/VISTARIL) 10 MG tablet, Take 10 mg by mouth daily., Disp: , Rfl:    KLOR-CON M20 20 MEQ tablet, TAKE 1 TABLET BY  MOUTH TWICE A DAY, Disp: 180 tablet, Rfl: 0   lidocaine-prilocaine (EMLA) cream, Apply 1 Application topically as needed (access port).,  Disp: 30 g, Rfl: 0   LORazepam (ATIVAN) 1 MG tablet, Take 1 tablet (1 mg total) by mouth every 8 (eight) hours as needed. for anxiety, Disp: 90 tablet, Rfl: 0   Multiple Vitamin (MULTIVITAMIN) tablet, Take 1 tablet by mouth daily., Disp: , Rfl:    olmesartan (BENICAR) 20 MG tablet, Take 1 tablet (20 mg total) by mouth daily., Disp: 90 tablet, Rfl: 1   omeprazole (PRILOSEC) 20 MG capsule, Take 1 capsule (20 mg total) by mouth daily., Disp: 90 capsule, Rfl: 1  PHYSICAL EXAM: ECOG FS:1 - Symptomatic but completely ambulatory    Vitals:   05/04/23 1001  BP: (!) 145/78  Pulse: 77  Resp: 17  Temp: 97.6 F (36.4 C)  TempSrc: Temporal  SpO2: 100%  Weight: 190 lb 4.8 oz (86.3 kg)   Physical Exam Vitals and nursing note reviewed.  Constitutional:      Appearance: He is not ill-appearing or toxic-appearing.  HENT:     Head: Normocephalic.  Eyes:     Conjunctiva/sclera: Conjunctivae normal.  Cardiovascular:     Rate and Rhythm: Normal rate.     Pulses:          Dorsalis pedis pulses are 2+ on the left side.  Pulmonary:     Effort: Pulmonary effort is normal.  Abdominal:     General: There is no distension.  Musculoskeletal:        General: Swelling (LLE) present. Normal range of motion.     Cervical back: Normal range of motion.     Comments: Normal gait. No bony tenderness of BLE. BLE without palpable cords, compartments are soft.  No lumbar spinal tenderness to palpation.  No paraspinal tenderness. No step offs, crepitus or deformity palpated.    Feet:     Comments: Abrasion on plantar aspect of left great toe. No bleeding or purulent drainage. No tenderness. Lymphadenopathy:     Lower Body: No right inguinal adenopathy. No left inguinal adenopathy.  Skin:    General: Skin is warm and dry.     Comments: Equal tactile temperature of BLE  Neurological:     Mental Status: He is alert.        LABORATORY DATA: I have reviewed the data as listed    Latest Ref Rng &  Units 05/01/2023    2:40 PM 03/18/2023    2:54 PM 02/13/2023    2:53 PM  CBC  WBC 4.0 - 10.5 K/uL 7.0  5.5  6.6   Hemoglobin 13.0 - 17.0 g/dL 16.1  09.6  04.5   Hematocrit 39.0 - 52.0 % 36.0  35.4  38.1   Platelets 150 - 400 K/uL 231  253  267         Latest Ref Rng & Units 05/01/2023    2:40 PM 03/18/2023    2:54 PM 02/13/2023    2:53 PM  CMP  Glucose 70 - 99 mg/dL 409  811  914   BUN 8 - 23 mg/dL 27  23  30    Creatinine 0.61 - 1.24 mg/dL 7.82  9.56  2.13   Sodium 135 - 145 mmol/L 136  137  137   Potassium 3.5 - 5.1 mmol/L 4.6  4.4  4.7   Chloride 98 - 111 mmol/L 104  104  102   CO2 22 - 32 mmol/L 27  28  26   Calcium 8.9 - 10.3 mg/dL 9.2  9.7  9.6   Total Protein 6.5 - 8.1 g/dL 6.6  6.7  6.9   Total Bilirubin 0.0 - 1.2 mg/dL 0.3  0.4  0.4   Alkaline Phos 38 - 126 U/L 50  47  53   AST 15 - 41 U/L 15  16  18    ALT 0 - 44 U/L 11  11  12         RADIOGRAPHIC STUDIES (from last 24 hours if applicable) I have personally reviewed the radiological images as listed and agreed with the findings in the report. VAS [place] LOWER EXTREMITY VENOUS (DVT) Result Date: 05/04/2023  Lower Venous DVT Study Patient Name:  KNUTE MAZZUCA  Date of Exam:   05/04/2023 Medical Rec #: [id number]    Accession #:    [id number] Date of Birth: 04/21/1945     Patient Gender: M Patient Age:   78 years Exam Location:  [facility] Procedure:      VAS [place] LOWER EXTREMITY VENOUS (DVT) Referring Phys: Namon Cirri --------------------------------------------------------------------------------  Indications: Pain.  Risk Factors: Cancer. Comparison Study: No prior studies. Performing Technologist: Chanda Busing RVT  Examination Guidelines: A complete evaluation includes B-mode imaging, spectral Doppler, color Doppler, and power Doppler as needed of all accessible portions of each vessel. Bilateral testing is considered an integral part of a complete examination. Limited examinations for reoccurring indications may be  performed as noted. The reflux portion of the exam is performed with the patient in reverse Trendelenburg.  +-----+---------------+---------+-----------+----------+--------------+ RIGHTCompressibilityPhasicitySpontaneityPropertiesThrombus Aging +-----+---------------+---------+-----------+----------+--------------+ CFV  Full           Yes      Yes                                 +-----+---------------+---------+-----------+----------+--------------+   +---------+---------------+---------+-----------+----------+--------------+ LEFT     CompressibilityPhasicitySpontaneityPropertiesThrombus Aging +---------+---------------+---------+-----------+----------+--------------+ CFV      Full           Yes      Yes                                 +---------+---------------+---------+-----------+----------+--------------+ SFJ      Full                                                        +---------+---------------+---------+-----------+----------+--------------+ FV Prox  Full                                                        +---------+---------------+---------+-----------+----------+--------------+ FV Mid   Full                                                        +---------+---------------+---------+-----------+----------+--------------+ FV DistalFull                                                        +---------+---------------+---------+-----------+----------+--------------+  PFV      Full                                                        +---------+---------------+---------+-----------+----------+--------------+ POP      Full           Yes      Yes                                 +---------+---------------+---------+-----------+----------+--------------+ PTV      Full                                                        +---------+---------------+---------+-----------+----------+--------------+ PERO     Full                                                         +---------+---------------+---------+-----------+----------+--------------+     Summary: RIGHT: - No evidence of common femoral vein obstruction.   LEFT: - There is no evidence of deep vein thrombosis in the lower extremity.  - No cystic structure found in the popliteal fossa.  *See table(s) above for measurements and observations.    Preliminary         Visit Diagnosis: 1. Prostate cancer (HCC)   2. Left leg pain      No orders of the defined types were placed in this encounter.   All questions were answered. The patient knows to call the clinic with any problems, questions or concerns. No barriers to learning was detected.  A total of more than 30 minutes were spent on this encounter with face-to-face time and non-face-to-face time, including preparing to see the patient, ordering tests, counseling the patient and coordination of care as outlined above.    Thank you for allowing me to participate in the care of this patient.    Shanon Ace, PA-C Department of Hematology/Oncology [facility] Phone: [phone]  [phone]    05/04/2023 3:06 PM

## 2023-05-04 NOTE — Progress Notes (Signed)
 Left lower extremity venous duplex has been completed. Preliminary results can be found in CV Proc through chart review.  Results were given to [facility] at [facility] office.  05/04/23 11:15 AM Olen Cordial RVT

## 2023-05-05 ENCOUNTER — Ambulatory Visit: Payer: Medicare Other | Admitting: Internal Medicine

## 2023-05-06 ENCOUNTER — Encounter: Payer: Self-pay | Admitting: Physician Assistant

## 2023-05-07 DIAGNOSIS — M25462 Effusion, left knee: Secondary | ICD-10-CM | POA: Diagnosis not present

## 2023-05-07 DIAGNOSIS — M898X5 Other specified disorders of bone, thigh: Secondary | ICD-10-CM | POA: Diagnosis not present

## 2023-05-13 ENCOUNTER — Other Ambulatory Visit (HOSPITAL_COMMUNITY): Payer: Self-pay

## 2023-05-14 ENCOUNTER — Other Ambulatory Visit: Payer: Self-pay | Admitting: *Deleted

## 2023-05-14 ENCOUNTER — Encounter: Payer: Self-pay | Admitting: Hematology and Oncology

## 2023-05-14 DIAGNOSIS — Z95828 Presence of other vascular implants and grafts: Secondary | ICD-10-CM

## 2023-05-14 MED ORDER — LIDOCAINE-PRILOCAINE 2.5-2.5 % EX CREA: 1.0000 | TOPICAL_CREAM | CUTANEOUS | 0 refills | Status: AC | PRN

## 2023-05-15 ENCOUNTER — Other Ambulatory Visit: Payer: Self-pay | Admitting: Internal Medicine

## 2023-05-15 DIAGNOSIS — M25562 Pain in left knee: Secondary | ICD-10-CM | POA: Diagnosis not present

## 2023-05-15 DIAGNOSIS — E785 Hyperlipidemia, unspecified: Secondary | ICD-10-CM

## 2023-05-15 DIAGNOSIS — I700 Atherosclerosis of aorta: Secondary | ICD-10-CM

## 2023-05-15 DIAGNOSIS — M79662 Pain in left lower leg: Secondary | ICD-10-CM | POA: Diagnosis not present

## 2023-05-22 DIAGNOSIS — M25462 Effusion, left knee: Secondary | ICD-10-CM | POA: Diagnosis not present

## 2023-05-26 ENCOUNTER — Other Ambulatory Visit: Payer: Self-pay | Admitting: Pharmacy Technician

## 2023-05-26 ENCOUNTER — Other Ambulatory Visit (HOSPITAL_COMMUNITY): Payer: Self-pay

## 2023-05-26 ENCOUNTER — Other Ambulatory Visit: Payer: Self-pay

## 2023-05-26 NOTE — Progress Notes (Signed)
 Specialty Pharmacy Refill Coordination Note  Philip [name] is a 78 y.o. male contacted today regarding refills of specialty medication(s) Enzalutamide [name]   Patient requested (Patient-Rptd) Pickup at [facility] date: (Patient-Rptd) 06/01/23   Medication will be filled on 05/29/23.

## 2023-05-29 ENCOUNTER — Other Ambulatory Visit: Payer: Self-pay

## 2023-06-02 ENCOUNTER — Encounter: Payer: Self-pay | Admitting: Internal Medicine

## 2023-06-02 ENCOUNTER — Ambulatory Visit (INDEPENDENT_AMBULATORY_CARE_PROVIDER_SITE_OTHER): Payer: Medicare Other | Admitting: Internal Medicine

## 2023-06-02 VITALS — BP 124/76 | HR 79 | Temp 98.0°F | Ht 76.0 in | Wt 189.0 lb

## 2023-06-02 DIAGNOSIS — C61 Malignant neoplasm of prostate: Secondary | ICD-10-CM

## 2023-06-02 DIAGNOSIS — K5904 Chronic idiopathic constipation: Secondary | ICD-10-CM | POA: Diagnosis not present

## 2023-06-02 DIAGNOSIS — I10 Essential (primary) hypertension: Secondary | ICD-10-CM | POA: Diagnosis not present

## 2023-06-02 DIAGNOSIS — I700 Atherosclerosis of aorta: Secondary | ICD-10-CM | POA: Diagnosis not present

## 2023-06-02 DIAGNOSIS — D509 Iron deficiency anemia, unspecified: Secondary | ICD-10-CM

## 2023-06-02 DIAGNOSIS — D538 Other specified nutritional anemias: Secondary | ICD-10-CM | POA: Diagnosis not present

## 2023-06-02 LAB — IBC + FERRITIN
Ferritin: 146.1 ng/mL (ref 22.0–322.0)
Iron: 91 ug/dL (ref 42–165)
Saturation Ratios: 27.1 % (ref 20.0–50.0)
TIBC: 336 ug/dL (ref 250.0–450.0)
Transferrin: 240 mg/dL (ref 212.0–360.0)

## 2023-06-02 LAB — TSH: TSH: 1.72 u[IU]/mL (ref 0.35–5.50)

## 2023-06-02 NOTE — Progress Notes (Signed)
 Subjective:  Patient ID: Philip [name], male    DOB: Mar 30, 1945  Age: 78 y.o. MRN: [id number]  CC: Hypertension, Anemia, and Hyperlipidemia   HPI Philip [name] presents for f/up -----  Discussed the use of AI scribe software for clinical note transcription with the patient, who gave verbal consent to proceed.  History of Present Illness   Philip [name] is a 78 year old male who presents for evaluation of anemia.  He attributes his anemia to his cancer and the chemotherapy he is undergoing. His oncologists are monitoring the condition. He is currently taking iron supplements to manage the anemia.  No chest pain, shortness of breath, dizziness, or lightheadedness. No symptoms of abnormal blood pressure, either high or low.       Outpatient Medications Prior to Visit  Medication Sig Dispense Refill   aspirin EC 81 MG tablet Take 81 mg by mouth daily. Swallow whole.     atorvastatin (LIPITOR) 10 MG tablet TAKE 1 TABLET BY MOUTH EVERY DAY 90 tablet 0   BIOTIN 5000 PO Take 1 tablet by mouth daily.     CALCIUM-VITAMIN D PO Take 1 tablet by mouth daily.     cyanocobalamin (VITAMIN B12) 1000 MCG tablet Take 1,000 mcg by mouth daily.     Denosumab (XGEVA [place]) Inject into the skin. Every 6-8 weeks.     enzalutamide (XTANDI) 40 MG tablet Take 4 tablets (160mg ) by mouth once daily as directed by physician. 120 tablet 6   ferrous sulfate 325 (65 FE) MG EC tablet TAKE 1 TABLET BY MOUTH EVERY DAY WITH BREAKFAST 90 tablet 1   fexofenadine (ALLEGRA) 180 MG tablet Take 180 mg by mouth daily.     hydrOXYzine (ATARAX/VISTARIL) 10 MG tablet Take 10 mg by mouth daily.     KLOR-CON M20 20 MEQ tablet TAKE 1 TABLET BY MOUTH TWICE A DAY 180 tablet 0   lidocaine-prilocaine (EMLA) cream Apply 1 Application topically as needed (access port). 30 g 0   LORazepam (ATIVAN) 1 MG tablet Take 1 tablet (1 mg total) by mouth every 8 (eight) hours as needed. for anxiety 90 tablet 0   Multiple Vitamin (MULTIVITAMIN) tablet Take  1 tablet by mouth daily.     olmesartan (BENICAR) 20 MG tablet TAKE 1 TABLET BY MOUTH EVERY DAY 90 tablet 1   omeprazole (PRILOSEC) 20 MG capsule Take 1 capsule (20 mg total) by mouth daily. 90 capsule 1   No facility-administered medications prior to visit.    ROS Review of Systems  Constitutional:  Negative for chills, fatigue and fever.  HENT:  Negative for trouble swallowing.   Eyes: Negative.   Respiratory: Negative.  Negative for cough, chest tightness, shortness of breath and wheezing.   Cardiovascular:  Negative for chest pain, palpitations and leg swelling.  Gastrointestinal:  Positive for constipation. Negative for abdominal pain, blood in stool, diarrhea, nausea and vomiting.  Endocrine: Negative.   Genitourinary: Negative.  Negative for difficulty urinating.  Musculoskeletal: Negative.  Negative for arthralgias and myalgias.  Skin: Negative.   Neurological: Negative.  Negative for dizziness and weakness.  Hematological:  Negative for adenopathy. Does not bruise/bleed easily.  Psychiatric/Behavioral: Negative.      Objective:  BP 124/76 (BP Location: Right Arm, Patient Position: Sitting, Cuff Size: Large)   Pulse 79   Temp 98 F (36.7 C) (Oral)   Ht 6\' 4"  (1.93 m)   Wt 189 lb (85.7 kg)   SpO2 98%   BMI 23.01 kg/m   BP  Readings from Last 3 Encounters:  06/02/23 124/76  05/04/23 (!) 145/78  03/26/23 126/76    Wt Readings from Last 3 Encounters:  06/02/23 189 lb (85.7 kg)  05/04/23 190 lb 4.8 oz (86.3 kg)  03/26/23 190 lb 8 oz (86.4 kg)    Physical Exam Vitals reviewed.  Constitutional:      Appearance: Normal appearance.  HENT:     Mouth/Throat:     Mouth: Mucous membranes are moist.  Eyes:     General: No scleral icterus.    Conjunctiva/sclera: Conjunctivae normal.  Cardiovascular:     Rate and Rhythm: Normal rate and regular rhythm.     Pulses: Normal pulses.     Heart sounds: No murmur heard.    No friction rub. No gallop.     Comments:  EKG-- NSR, 72 bpm No LVH, Q waves, or ST/T wave changes  unchanged Pulmonary:     Effort: Pulmonary effort is normal.     Breath sounds: No stridor. No wheezing, rhonchi or rales.  Abdominal:     General: Abdomen is flat. Bowel sounds are normal.     Palpations: There is no mass.     Tenderness: There is no abdominal tenderness. There is no guarding.     Hernia: No hernia is present.  Musculoskeletal:        General: Normal range of motion.     Cervical back: Neck supple.     Right lower leg: No edema.     Left lower leg: No edema.  Lymphadenopathy:     Cervical: No cervical adenopathy.  Skin:    General: Skin is warm and dry.  Neurological:     General: No focal deficit present.     Mental Status: He is alert. Mental status is at baseline.  Psychiatric:        Mood and Affect: Mood normal.        Behavior: Behavior normal.     Lab Results  Component Value Date   WBC 7.0 05/01/2023   HGB 11.9 (L) 05/01/2023   HCT 36.0 (L) 05/01/2023   PLT 231 05/01/2023   GLUCOSE 114 (H) 05/01/2023   CHOL 253 (H) 09/30/2022   TRIG 313.0 (H) 09/30/2022   HDL 84.10 09/30/2022   LDLDIRECT 114.0 09/30/2022   ALT 11 05/01/2023   AST 15 05/01/2023   NA 136 05/01/2023   K 4.6 05/01/2023   CL 104 05/01/2023   CREATININE 1.04 05/01/2023   BUN 27 (H) 05/01/2023   CO2 27 05/01/2023   TSH 1.72 06/02/2023   PSA 0.01 (L) 09/30/2022   INR 1.0 03/26/2023    VAS [place] LOWER EXTREMITY VENOUS (DVT) Result Date: 05/04/2023  Lower Venous DVT Study Patient Name:  Philip [name]  Date of Exam:   05/04/2023 Medical Rec #: [id number]    Accession #:    [id number] Date of Birth: 06/19/1945     Patient Gender: M Patient Age:   58 years Exam Location:  [facility] Procedure:      VAS [place] LOWER EXTREMITY VENOUS (DVT) Referring Phys: Namon Cirri --------------------------------------------------------------------------------  Indications: Pain.  Risk Factors: Cancer. Comparison Study: No prior studies.  Performing Technologist: Chanda Busing RVT  Examination Guidelines: A complete evaluation includes B-mode imaging, spectral Doppler, color Doppler, and power Doppler as needed of all accessible portions of each vessel. Bilateral testing is considered an integral part of a complete examination. Limited examinations for reoccurring indications may be performed as noted. The reflux portion of the  exam is performed with the patient in reverse Trendelenburg.  +-----+---------------+---------+-----------+----------+--------------+ RIGHTCompressibilityPhasicitySpontaneityPropertiesThrombus Aging +-----+---------------+---------+-----------+----------+--------------+ CFV  Full           Yes      Yes                                 +-----+---------------+---------+-----------+----------+--------------+   +---------+---------------+---------+-----------+----------+--------------+ LEFT     CompressibilityPhasicitySpontaneityPropertiesThrombus Aging +---------+---------------+---------+-----------+----------+--------------+ CFV      Full           Yes      Yes                                 +---------+---------------+---------+-----------+----------+--------------+ SFJ      Full                                                        +---------+---------------+---------+-----------+----------+--------------+ FV Prox  Full                                                        +---------+---------------+---------+-----------+----------+--------------+ FV Mid   Full                                                        +---------+---------------+---------+-----------+----------+--------------+ FV DistalFull                                                        +---------+---------------+---------+-----------+----------+--------------+ PFV      Full                                                         +---------+---------------+---------+-----------+----------+--------------+ POP      Full           Yes      Yes                                 +---------+---------------+---------+-----------+----------+--------------+ PTV      Full                                                        +---------+---------------+---------+-----------+----------+--------------+ PERO     Full                                                        +---------+---------------+---------+-----------+----------+--------------+  Summary: RIGHT: - No evidence of common femoral vein obstruction.   LEFT: - There is no evidence of deep vein thrombosis in the lower extremity.  - No cystic structure found in the popliteal fossa.  *See table(s) above for measurements and observations. Electronically signed by Gerarda Fraction on 05/04/2023 at 7:01:33 PM.    Final     Assessment & Plan:   Essential hypertension- BP is well controlled. EKG is negative for LVH. -     EKG 12-Lead -     TSH; Future  Prostate cancer [facility])- He is responding well to the chemotherapy.  Atherosclerosis of aorta (HCC)- Risk factor modifications addressed.  Chronic idiopathic constipation -     TSH; Future  Iron deficiency anemia, unspecified iron deficiency anemia type -     IBC + Ferritin; Future  Anemia due to zinc deficiency -     Zinc; Future -     Zinc Gluconate; Take 1 tablet (50 mg total) by mouth daily.  Dispense: 90 tablet; Refill: 1  Other orders -     EKG     Follow-up: Return in about 6 months (around 12/02/2023).  Sanda Linger, MD

## 2023-06-02 NOTE — Patient Instructions (Signed)
 Hypertension, Adult High blood pressure (hypertension) is when the force of blood pumping through the arteries is too strong. The arteries are the blood vessels that carry blood from the heart throughout the body. Hypertension forces the heart to work harder to pump blood and may cause arteries to become narrow or stiff. Untreated or uncontrolled hypertension can lead to a heart attack, heart failure, a stroke, kidney disease, and other problems. A blood pressure reading consists of a higher number over a lower number. Ideally, your blood pressure should be below 120/80. The first ("top") number is called the systolic pressure. It is a measure of the pressure in your arteries as your heart beats. The second ("bottom") number is called the diastolic pressure. It is a measure of the pressure in your arteries as the heart relaxes. What are the causes? The exact cause of this condition is not known. There are some conditions that result in high blood pressure. What increases the risk? Certain factors may make you more likely to develop high blood pressure. Some of these risk factors are under your control, including: Smoking. Not getting enough exercise or physical activity. Being overweight. Having too much fat, sugar, calories, or salt (sodium) in your diet. Drinking too much alcohol. Other risk factors include: Having a personal history of heart disease, diabetes, high cholesterol, or kidney disease. Stress. Having a family history of high blood pressure and high cholesterol. Having obstructive sleep apnea. Age. The risk increases with age. What are the signs or symptoms? High blood pressure may not cause symptoms. Very high blood pressure (hypertensive crisis) may cause: Headache. Fast or irregular heartbeats (palpitations). Shortness of breath. Nosebleed. Nausea and vomiting. Vision changes. Severe chest pain, dizziness, and seizures. How is this diagnosed? This condition is diagnosed by  measuring your blood pressure while you are seated, with your arm resting on a flat surface, your legs uncrossed, and your feet flat on the floor. The cuff of the blood pressure monitor will be placed directly against the skin of your upper arm at the level of your heart. Blood pressure should be measured at least twice using the same arm. Certain conditions can cause a difference in blood pressure between your right and left arms. If you have a high blood pressure reading during one visit or you have normal blood pressure with other risk factors, you may be asked to: Return on a different day to have your blood pressure checked again. Monitor your blood pressure at home for 1 week or longer. If you are diagnosed with hypertension, you may have other blood or imaging tests to help your health care provider understand your overall risk for other conditions. How is this treated? This condition is treated by making healthy lifestyle changes, such as eating healthy foods, exercising more, and reducing your alcohol intake. You may be referred for counseling on a healthy diet and physical activity. Your health care provider may prescribe medicine if lifestyle changes are not enough to get your blood pressure under control and if: Your systolic blood pressure is above 130. Your diastolic blood pressure is above 80. Your personal target blood pressure may vary depending on your medical conditions, your age, and other factors. Follow these instructions at home: Eating and drinking  Eat a diet that is high in fiber and potassium, and low in sodium, added sugar, and fat. An example of this eating plan is called the DASH diet. DASH stands for Dietary Approaches to Stop Hypertension. To eat this way: Eat  plenty of fresh fruits and vegetables. Try to fill one half of your plate at each meal with fruits and vegetables. Eat whole grains, such as whole-wheat pasta, brown rice, or whole-grain bread. Fill about one  fourth of your plate with whole grains. Eat or drink low-fat dairy products, such as skim milk or low-fat yogurt. Avoid fatty cuts of meat, processed or cured meats, and poultry with skin. Fill about one fourth of your plate with lean proteins, such as fish, chicken without skin, beans, eggs, or tofu. Avoid pre-made and processed foods. These tend to be higher in sodium, added sugar, and fat. Reduce your daily sodium intake. Many people with hypertension should eat less than 1,500 mg of sodium a day. Do not drink alcohol if: Your health care provider tells you not to drink. You are pregnant, may be pregnant, or are planning to become pregnant. If you drink alcohol: Limit how much you have to: 0-1 drink a day for women. 0-2 drinks a day for men. Know how much alcohol is in your drink. In the [place] one drink equals one 12 oz bottle of beer (355 mL), one 5 oz glass of wine (148 mL), or one 1 oz glass of hard liquor (44 mL). Lifestyle  Work with your health care provider to maintain a healthy body weight or to lose weight. Ask what an ideal weight is for you. Get at least 30 minutes of exercise that causes your heart to beat faster (aerobic exercise) most days of the week. Activities may include walking, swimming, or biking. Include exercise to strengthen your muscles (resistance exercise), such as Pilates or lifting weights, as part of your weekly exercise routine. Try to do these types of exercises for 30 minutes at least 3 days a week. Do not use any products that contain nicotine or tobacco. These products include cigarettes, chewing tobacco, and vaping devices, such as e-cigarettes. If you need help quitting, ask your health care provider. Monitor your blood pressure at home as told by your health care provider. Keep all follow-up visits. This is important. Medicines Take over-the-counter and prescription medicines only as told by your health care provider. Follow directions carefully. Blood  pressure medicines must be taken as prescribed. Do not skip doses of blood pressure medicine. Doing this puts you at risk for problems and can make the medicine less effective. Ask your health care provider about side effects or reactions to medicines that you should watch for. Contact a health care provider if you: Think you are having a reaction to a medicine you are taking. Have headaches that keep coming back (recurring). Feel dizzy. Have swelling in your ankles. Have trouble with your vision. Get help right away if you: Develop a severe headache or confusion. Have unusual weakness or numbness. Feel faint. Have severe pain in your chest or abdomen. Vomit repeatedly. Have trouble breathing. These symptoms may be an emergency. Get help right away. Call 911. Do not wait to see if the symptoms will go away. Do not drive yourself to the hospital. Summary Hypertension is when the force of blood pumping through your arteries is too strong. If this condition is not controlled, it may put you at risk for serious complications. Your personal target blood pressure may vary depending on your medical conditions, your age, and other factors. For most people, a normal blood pressure is less than 120/80. Hypertension is treated with lifestyle changes, medicines, or a combination of both. Lifestyle changes include losing weight, eating a healthy,  low-sodium diet, exercising more, and limiting alcohol. This information is not intended to replace advice given to you by your health care provider. Make sure you discuss any questions you have with your health care provider. Document Revised: 12/25/2020 Document Reviewed: 12/25/2020 Elsevier Patient Education  2024 [employer].

## 2023-06-05 ENCOUNTER — Encounter: Payer: Self-pay | Admitting: Hematology and Oncology

## 2023-06-05 LAB — ZINC: Zinc: 56 ug/dL — ABNORMAL LOW (ref 60–130)

## 2023-06-06 ENCOUNTER — Encounter: Payer: Self-pay | Admitting: Internal Medicine

## 2023-06-06 MED ORDER — ZINC GLUCONATE 50 MG PO TABS: 50.0000 mg | ORAL_TABLET | Freq: Every day | ORAL | 1 refills | Status: DC

## 2023-06-09 ENCOUNTER — Other Ambulatory Visit: Payer: Self-pay | Admitting: *Deleted

## 2023-06-09 DIAGNOSIS — C61 Malignant neoplasm of prostate: Secondary | ICD-10-CM

## 2023-06-10 ENCOUNTER — Inpatient Hospital Stay: Payer: Medicare Other | Attending: Oncology

## 2023-06-10 ENCOUNTER — Inpatient Hospital Stay

## 2023-06-10 DIAGNOSIS — C779 Secondary and unspecified malignant neoplasm of lymph node, unspecified: Secondary | ICD-10-CM | POA: Diagnosis not present

## 2023-06-10 DIAGNOSIS — C61 Malignant neoplasm of prostate: Secondary | ICD-10-CM | POA: Diagnosis not present

## 2023-06-10 DIAGNOSIS — Z95828 Presence of other vascular implants and grafts: Secondary | ICD-10-CM

## 2023-06-10 DIAGNOSIS — Z9221 Personal history of antineoplastic chemotherapy: Secondary | ICD-10-CM | POA: Diagnosis not present

## 2023-06-10 DIAGNOSIS — C7951 Secondary malignant neoplasm of bone: Secondary | ICD-10-CM | POA: Insufficient documentation

## 2023-06-10 DIAGNOSIS — Z9079 Acquired absence of other genital organ(s): Secondary | ICD-10-CM | POA: Insufficient documentation

## 2023-06-10 DIAGNOSIS — Z807 Family history of other malignant neoplasms of lymphoid, hematopoietic and related tissues: Secondary | ICD-10-CM | POA: Insufficient documentation

## 2023-06-10 LAB — CMP (CANCER CENTER ONLY)
ALT: 11 U/L (ref 0–44)
AST: 17 U/L (ref 15–41)
Albumin: 4.1 g/dL (ref 3.5–5.0)
Alkaline Phosphatase: 47 U/L (ref 38–126)
Anion gap: 7 (ref 5–15)
BUN: 24 mg/dL — ABNORMAL HIGH (ref 8–23)
CO2: 27 mmol/L (ref 22–32)
Calcium: 9.4 mg/dL (ref 8.9–10.3)
Chloride: 104 mmol/L (ref 98–111)
Creatinine: 1.2 mg/dL (ref 0.61–1.24)
GFR, Estimated: >60 mL/min (ref >60)
Glucose, Bld: 96 mg/dL (ref 70–99)
Potassium: 4.7 mmol/L (ref 3.5–5.1)
Sodium: 138 mmol/L (ref 135–145)
Total Bilirubin: 0.4 mg/dL (ref 0.0–1.2)
Total Protein: 6.7 g/dL (ref 6.5–8.1)

## 2023-06-10 LAB — CBC WITH DIFFERENTIAL (CANCER CENTER ONLY)
Abs Immature Granulocytes: 0.01 10*3/uL (ref 0.00–0.07)
Basophils Absolute: 0 10*3/uL (ref 0.0–0.1)
Basophils Relative: 0 %
Eosinophils Absolute: 0.1 10*3/uL (ref 0.0–0.5)
Eosinophils Relative: 1 %
HCT: 36 % — ABNORMAL LOW (ref 39.0–52.0)
Hemoglobin: 12.1 g/dL — ABNORMAL LOW (ref 13.0–17.0)
Immature Granulocytes: 0 %
Lymphocytes Relative: 33 %
Lymphs Abs: 1.9 10*3/uL (ref 0.7–4.0)
MCH: 31.3 pg (ref 26.0–34.0)
MCHC: 33.6 g/dL (ref 30.0–36.0)
MCV: 93 fL (ref 80.0–100.0)
Monocytes Absolute: 0.7 10*3/uL (ref 0.1–1.0)
Monocytes Relative: 12 %
Neutro Abs: 3 10*3/uL (ref 1.7–7.7)
Neutrophils Relative %: 54 %
Platelet Count: 258 10*3/uL (ref 150–400)
RBC: 3.87 MIL/uL — ABNORMAL LOW (ref 4.22–5.81)
RDW: 13.2 % (ref 11.5–15.5)
WBC Count: 5.7 10*3/uL (ref 4.0–10.5)
nRBC: 0 % (ref 0.0–0.2)

## 2023-06-10 MED ORDER — HEPARIN SOD (PORK) LOCK FLUSH 100 UNIT/ML IV SOLN
500.0000 [IU] | Freq: Once | INTRAVENOUS | Status: AC
  Administered 2023-06-10: 500 [IU]

## 2023-06-10 MED ORDER — SODIUM CHLORIDE 0.9% FLUSH
10.0000 mL | Freq: Once | INTRAVENOUS | Status: AC
  Administered 2023-06-10: 10 mL

## 2023-06-10 NOTE — Progress Notes (Signed)
 Unable to get blood return. Sent to lab.

## 2023-06-11 LAB — PSA, TOTAL AND FREE
PSA, Free Pct: UNDETERMINED %
PSA, Free: <0.02 ng/mL
Prostate Specific Ag, Serum: <0.1 ng/mL (ref 0.0–4.0)

## 2023-06-12 ENCOUNTER — Ambulatory Visit: Payer: Medicare Other

## 2023-06-12 ENCOUNTER — Inpatient Hospital Stay: Payer: Medicare Other | Admitting: Hematology and Oncology

## 2023-06-12 VITALS — BP 154/87 | HR 76 | Temp 97.7°F | Resp 13 | Wt 189.4 lb

## 2023-06-12 DIAGNOSIS — C61 Malignant neoplasm of prostate: Secondary | ICD-10-CM

## 2023-06-12 DIAGNOSIS — Z95828 Presence of other vascular implants and grafts: Secondary | ICD-10-CM

## 2023-06-12 DIAGNOSIS — Z9079 Acquired absence of other genital organ(s): Secondary | ICD-10-CM | POA: Diagnosis not present

## 2023-06-12 DIAGNOSIS — Z807 Family history of other malignant neoplasms of lymphoid, hematopoietic and related tissues: Secondary | ICD-10-CM | POA: Diagnosis not present

## 2023-06-12 DIAGNOSIS — C779 Secondary and unspecified malignant neoplasm of lymph node, unspecified: Secondary | ICD-10-CM | POA: Diagnosis not present

## 2023-06-12 DIAGNOSIS — Z9221 Personal history of antineoplastic chemotherapy: Secondary | ICD-10-CM | POA: Diagnosis not present

## 2023-06-12 DIAGNOSIS — C7951 Secondary malignant neoplasm of bone: Secondary | ICD-10-CM | POA: Diagnosis not present

## 2023-06-12 NOTE — Progress Notes (Signed)
 [facility] [phone]   [phone]  PROGRESS NOTE  Patient Care Team: Arcadio Knuckles, MD as PCP - General (Internal Medicine) Velton Gibbon, OD as Consulting Physician (Optometry) Secundino Dach, Harvey Linen., MD as Consulting Physician (Urology) Renna Cary, MD (Inactive) as Consulting Physician (Oncology)  Hematological/Oncological History # Castrate Resistant Advanced Prostate Cancer # Metastatic Prostate Cancer to Lymph Nodes/Bone  01/2015: orchiectomy performed 02/16/2015: Taxotere chemotherapy at 75 mg/m. Continued until 05/2015 09/2015: start of zytiga. Stopped in 2023 due to insurance issues 04/2021: Xtandi 160 mg daily  03/19/2022: last visit with Dr. Dirk Fredericks 05/20/2022: transition care to Dr. Rosaline Coma   Interval History:  Philip [name] 78 y.o. 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 The patient's last visit was on 03/20/2023. In the interim since the last visit he has continued on Xtandi therapy.   On exam today Mr. Hilscher reports he has had some swelling in his leg as well as pain up his femur.  He did recently have an x-ray and MRI which did not show any concerning abnormalities.  He reports that he did have his Ortho doctor take some fluid off his knee which did help to improve his symptoms.  He reports that he has had some recent dental work and will be reevaluated on 06/18/2023.  In the meantime his Xgeva has been held.  He reports that overall he has been feeling well.  Does have some occasional fatigue in the afternoon.  His Xtandi cost him $0 per month.  He reports that he does not have any hot flashes or sweats.  He reports his appetite is strong reporting is an 8 or 9 out of 10.  He reports otherwise he is willing and able to continue with Xtandi therapy at this time.Philip [name] He denies fevers, chills, sweats, shortness of  breath, chest pain or cough. He has no other complaints.  A full 10 point ROS was otherwise negative.  MEDICAL HISTORY:  Past Medical History:  Diagnosis Date   Allergy    Anxiety    Arthritis    Atherosclerosis of aorta (HCC) 03/11/2017   The 10-year ASCVD risk score Risa Cheney DC Jr., et al., 2013) is: 24.6%   Values used to calculate the score:     Age: 81 years     Sex: Male     Is Non-Hispanic African American: No     Diabetic: No     Tobacco smoker: No     Systolic Blood Pressure: 154 mmHg     Is BP treated: No     HDL Cholesterol: 71.1 mg/dL     Total Cholesterol: 212 mg/dL   Cancer (HCC)    PROSTATE   Cataract    Chronic idiopathic constipation 03/11/2017   Depression    Edema leg 11/29/2014   left leg from foot to thigh, increasinly worse over last 4 weeks   Essential hypertension 03/30/2018   Gastroesophageal reflux disease without esophagitis 03/11/2017   Hyperlipidemia LDL goal <100 03/16/2017   The 10-year ASCVD risk score Risa Cheney DC Jr., et al., 2013) is: 30.6%   Values used to calculate the score:     Age: 39 years     Sex: Male     Is Non-Hispanic African American: No     Diabetic: No     Tobacco smoker: No     Systolic Blood  Pressure: 148 mmHg     Is BP treated: Yes     HDL Cholesterol: 69.3 mg/dL     Total Cholesterol: 211 mg/dL   Hypertension    Hypoglycemia    Swelling LAST 30 DAYS DR [facility] AWARE   LEFT LEG AND FOOT    SURGICAL HISTORY: Past Surgical History:  Procedure Laterality Date   BOWEL DECOMPRESSION N/A 03/07/2021   Procedure: BOWEL DECOMPRESSION;  Surgeon: Ace Holder, MD;  Location: [facility];  Service: Gastroenterology;  Laterality: N/A;   BOWEL DECOMPRESSION N/A 06/07/2021   Procedure: BOWEL DECOMPRESSION;  Surgeon: Alvis Jourdain, MD;  Location: [facility];  Service: Gastroenterology;  Laterality: N/A;   COLON SURGERY     FLEXIBLE SIGMOIDOSCOPY N/A 03/07/2021   Procedure: FLEXIBLE SIGMOIDOSCOPY;  Surgeon: Ace Holder, MD;  Location:  [facility];  Service: Gastroenterology;  Laterality: N/A;   FLEXIBLE SIGMOIDOSCOPY N/A 06/07/2021   Procedure: FLEXIBLE SIGMOIDOSCOPY;  Surgeon: Alvis Jourdain, MD;  Location: [facility];  Service: Gastroenterology;  Laterality: N/A;   GUM SURGERY     TEETH IMPLANTS ALSO   LAPAROSCOPIC SIGMOID COLECTOMY N/A 06/10/2021   Procedure: LAPAROSCOPIC ASSISTED SIGMOID COLECTOMY;  Surgeon: Jacolyn Matar, MD;  Location: [facility];  Service: General;  Laterality: N/A;   ORCHIECTOMY Bilateral 01/03/2015   Procedure: ORCHIECTOMY;  Surgeon: Osborn Blaze, MD;  Location: [facility];  Service: Urology;  Laterality: Bilateral;    SOCIAL HISTORY: Social History   Socioeconomic History   Marital status: Married    Spouse name: Not on file   Number of children: Not on file   Years of education: Not on file   Highest education level: Bachelor's degree (e.g., BA, AB, BS)  Occupational History   Not on file  Tobacco Use   Smoking status: Never   Smokeless tobacco: Never  Vaping Use   Vaping status: Never Used  Substance and Sexual Activity   Alcohol use: Yes    Alcohol/week: 1.0 standard drink of alcohol    Types: 1 Glasses of wine per week    Comment: occasional wine with dinner   Drug use: No   Sexual activity: Not Currently    Birth control/protection: None  Other Topics Concern   Not on file  Social History Narrative   Not on file   Social Drivers of Health   Financial Resource Strain: Low Risk  (04/21/2023)   Overall Financial Resource Strain (CARDIA)    Difficulty of Paying Living Expenses: Not hard at all  Food Insecurity: No Food Insecurity (04/21/2023)   Hunger Vital Sign    Worried About Running Out of Food in the Last Year: Never true    Ran Out of Food in the Last Year: Never true  Transportation Needs: No Transportation Needs (04/21/2023)   PRAPARE - Administrator, Civil Service (Medical): No    Lack of Transportation (Non-Medical): No  Physical Activity:  Insufficiently Active (04/21/2023)   Exercise Vital Sign    Days of Exercise per Week: 7 days    Minutes of Exercise per Session: 20 min  Stress: Stress Concern Present (04/21/2023)   [employer] of Occupational Health - Occupational Stress Questionnaire    Feeling of Stress : Rather much  Social Connections: Socially Isolated (04/21/2023)   Social Connection and Isolation Panel [NHANES]    Frequency of Communication with Friends and Family: Never    Frequency of Social Gatherings with Friends and Family: Never    Attends Religious Services: Never    Active  Member of Clubs or Organizations: No    Attends Engineer, structural: Not on file    Marital Status: Married  Intimate Partner Violence: Not At Risk (04/24/2023)   Humiliation, Afraid, Rape, and Kick questionnaire    Fear of Current or Ex-Partner: No    Emotionally Abused: No    Physically Abused: No    Sexually Abused: No    FAMILY HISTORY: Family History  Problem Relation Age of Onset   CVA Mother    Lymphoma Mother    Pneumonia Mother    CAD Father    Osteoarthritis Brother    Colon cancer Neg Hx     ALLERGIES:  is allergic to prednisone.  MEDICATIONS:  Current Outpatient Medications  Medication Sig Dispense Refill   aspirin EC 81 MG tablet Take 81 mg by mouth daily. Swallow whole.     atorvastatin (LIPITOR) 10 MG tablet TAKE 1 TABLET BY MOUTH EVERY DAY 90 tablet 0   BIOTIN 5000 PO Take 1 tablet by mouth daily.     CALCIUM-VITAMIN D PO Take 1 tablet by mouth daily.     cyanocobalamin (VITAMIN B12) 1000 MCG tablet Take 1,000 mcg by mouth daily.     Denosumab (XGEVA [place]) Inject into the skin. Every 6-8 weeks.     enzalutamide (XTANDI) 40 MG tablet Take 4 tablets (160mg ) by mouth once daily as directed by physician. 120 tablet 6   ferrous sulfate 325 (65 FE) MG EC tablet TAKE 1 TABLET BY MOUTH EVERY DAY WITH BREAKFAST 90 tablet 1   fexofenadine (ALLEGRA) 180 MG tablet Take 180 mg by mouth daily.      hydrOXYzine (ATARAX/VISTARIL) 10 MG tablet Take 10 mg by mouth daily.     KLOR-CON M20 20 MEQ tablet TAKE 1 TABLET BY MOUTH TWICE A DAY 180 tablet 0   lidocaine-prilocaine (EMLA) cream Apply 1 Application topically as needed (access port). 30 g 0   LORazepam (ATIVAN) 1 MG tablet Take 1 tablet (1 mg total) by mouth every 8 (eight) hours as needed. for anxiety 90 tablet 0   Multiple Vitamin (MULTIVITAMIN) tablet Take 1 tablet by mouth daily.     olmesartan (BENICAR) 20 MG tablet TAKE 1 TABLET BY MOUTH EVERY DAY 90 tablet 1   omeprazole (PRILOSEC) 20 MG capsule Take 1 capsule (20 mg total) by mouth daily. 90 capsule 1   No current facility-administered medications for this visit.    REVIEW OF SYSTEMS:   Constitutional: ( - ) fevers, ( - )  chills , ( - ) night sweats Eyes: ( - ) blurriness of vision, ( - ) double vision, ( - ) watery eyes Ears, nose, mouth, throat, and face: ( - ) mucositis, ( - ) sore throat Respiratory: ( - ) cough, ( - ) dyspnea, ( - ) wheezes Cardiovascular: ( - ) palpitation, ( - ) chest discomfort, ( - ) lower extremity swelling Gastrointestinal:  ( - ) nausea, ( - ) heartburn, ( - ) change in bowel habits Skin: ( - ) abnormal skin rashes Lymphatics: ( - ) new lymphadenopathy, ( - ) easy bruising Neurological: ( - ) numbness, ( - ) tingling, ( - ) new weaknesses Behavioral/Psych: ( - ) mood change, ( - ) new changes  All other systems were reviewed with the patient and are negative.  PHYSICAL EXAMINATION: ECOG PERFORMANCE STATUS: 0 - Asymptomatic  Vitals:   06/12/23 1450  BP: (!) 154/87  Pulse: 76  Resp: 13  Temp: 97.7  F (36.5 C)  SpO2: 100%    Filed Weights   06/12/23 1450  Weight: 189 lb 6.4 oz (85.9 kg)     GENERAL: Well-appearing elderly Caucasian male, alert, no distress and comfortable SKIN: skin color, texture, turgor are normal, no rashes or significant lesions EYES: conjunctiva are pink and non-injected, sclera clear LUNGS: clear to  auscultation and percussion with normal breathing effort HEART: regular rate & rhythm and no murmurs and no lower extremity edema Musculoskeletal: no cyanosis of digits and no clubbing  PSYCH: alert & oriented x 3, fluent speech NEURO: no focal motor/sensory deficits  LABORATORY DATA:  I have reviewed the data as listed    Latest Ref Rng & Units 06/10/2023    2:48 PM 05/01/2023    2:40 PM 03/18/2023    2:54 PM  CBC  WBC 4.0 - 10.5 K/uL 5.7  7.0  5.5   Hemoglobin 13.0 - 17.0 g/dL 40.9  81.1  91.4   Hematocrit 39.0 - 52.0 % 36.0  36.0  35.4   Platelets 150 - 400 K/uL 258  231  253        Latest Ref Rng & Units 06/10/2023    2:48 PM 05/01/2023    2:40 PM 03/18/2023    2:54 PM  CMP  Glucose 70 - 99 mg/dL 96  782  956   BUN 8 - 23 mg/dL 24  27  23    Creatinine 0.61 - 1.24 mg/dL 2.13  0.86  5.78   Sodium 135 - 145 mmol/L 138  136  137   Potassium 3.5 - 5.1 mmol/L 4.7  4.6  4.4   Chloride 98 - 111 mmol/L 104  104  104   CO2 22 - 32 mmol/L 27  27  28    Calcium 8.9 - 10.3 mg/dL 9.4  9.2  9.7   Total Protein 6.5 - 8.1 g/dL 6.7  6.6  6.7   Total Bilirubin 0.0 - 1.2 mg/dL 0.4  0.3  0.4   Alkaline Phos 38 - 126 U/L 47  50  47   AST 15 - 41 U/L 17  15  16    ALT 0 - 44 U/L 11  11  11      RADIOGRAPHIC STUDIES: No results found.  ASSESSMENT & PLAN Philip [name] is a 78 y.o. 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  # Castrate Resistant Advanced Prostate Cancer # Metastatic Prostate Cancer to Lymph Nodes/Bone  --patient is s/p orchiectomy, no ADT therapy required --Most recent PSA level from 09/16/2022 was <0.1.  --continue Xtandi 160 mg PO daily -- last CT chest abdomen pelvis showed no evidence of disease progression on 01/22/2023.  --Labs today show white blood cell count 5.7, hemoglobin 12.1, MCV 93.0, platelets 258 --Continue with monthly Xgeva shots with  labs Benigno Brakeman on hold for recent dental work). RTC in 3 months for toxicity check.   #Cirrhotic Morphology of Liver on CT scan -- Found incidentally, evaluated by patient's gastroenterologist Dr. General Kenner  No orders of the defined types were placed in this encounter.   All questions were answered. The patient knows to call the clinic with any problems, questions or concerns.  I have spent a total of 30 minutes minutes of face-to-face and non-face-to-face time, preparing to see the patient,  performing a medically appropriate examination, counseling and educating the patient, documenting clinical information in the electronic  health record, independently interpreting results and communicating results to the patient, and care coordination.   Rogerio Clay, MD Department of Hematology/Oncology [facility] Phone: [phone] Pager: [phone] Email: [email or web address]   06/12/2023 3:22 PM

## 2023-06-14 ENCOUNTER — Encounter: Payer: Self-pay | Admitting: Hematology and Oncology

## 2023-06-15 ENCOUNTER — Encounter: Payer: Self-pay | Admitting: Hematology and Oncology

## 2023-06-16 ENCOUNTER — Other Ambulatory Visit: Payer: Self-pay | Admitting: *Deleted

## 2023-06-16 DIAGNOSIS — C61 Malignant neoplasm of prostate: Secondary | ICD-10-CM

## 2023-06-19 ENCOUNTER — Other Ambulatory Visit: Payer: Self-pay

## 2023-06-19 NOTE — Progress Notes (Signed)
 Specialty Pharmacy Refill Coordination Note  Philip [name] is a 78 y.o. male contacted today regarding refills of specialty medication(s) Enzalutamide  (XTANDI )   Patient requested [name] at [facility] at [place] date: 06/29/23   Medication will be filled on 06/26/23.

## 2023-06-19 NOTE — Progress Notes (Signed)
 Specialty Pharmacy Ongoing Clinical Assessment Note  Philip [name] is a 78 y.o. male who is being followed by the specialty pharmacy service for RxSp Oncology   Patient's specialty medication(s) reviewed today: Enzalutamide  (XTANDI )   Missed doses in the last 4 weeks: 0   Patient/Caregiver did not have any additional questions or concerns.   Therapeutic benefit summary: Patient is achieving benefit   Adverse events/side effects summary: Experienced adverse events/side effects (mild tolerable fatigue in evening)   Patient's therapy is appropriate to: Continue    Goals Addressed             This Visit's Progress    Maintain optimal adherence to therapy   On track    Patient is on track. Patient will maintain adherence         Follow up:  3 months  Philip [name] Specialty Pharmacist

## 2023-06-20 ENCOUNTER — Other Ambulatory Visit: Payer: Self-pay | Admitting: Internal Medicine

## 2023-06-20 DIAGNOSIS — D509 Iron deficiency anemia, unspecified: Secondary | ICD-10-CM

## 2023-06-24 ENCOUNTER — Encounter: Payer: Self-pay | Admitting: Internal Medicine

## 2023-06-24 ENCOUNTER — Other Ambulatory Visit: Payer: Self-pay

## 2023-06-24 DIAGNOSIS — D509 Iron deficiency anemia, unspecified: Secondary | ICD-10-CM

## 2023-06-24 MED ORDER — FERROUS SULFATE 325 (65 FE) MG PO TBEC
325.0000 mg | DELAYED_RELEASE_TABLET | Freq: Every day | ORAL | 1 refills | Status: AC
Start: 2023-06-24 — End: ?

## 2023-06-26 ENCOUNTER — Other Ambulatory Visit: Payer: Self-pay

## 2023-07-01 DIAGNOSIS — H2513 Age-related nuclear cataract, bilateral: Secondary | ICD-10-CM | POA: Diagnosis not present

## 2023-07-01 DIAGNOSIS — H25013 Cortical age-related cataract, bilateral: Secondary | ICD-10-CM | POA: Diagnosis not present

## 2023-07-22 ENCOUNTER — Inpatient Hospital Stay: Attending: Oncology

## 2023-07-22 ENCOUNTER — Other Ambulatory Visit: Payer: Self-pay

## 2023-07-22 ENCOUNTER — Other Ambulatory Visit: Payer: Self-pay | Admitting: Hematology and Oncology

## 2023-07-22 ENCOUNTER — Inpatient Hospital Stay

## 2023-07-22 VITALS — BP 129/94 | HR 72 | Temp 98.7°F | Resp 16

## 2023-07-22 DIAGNOSIS — Z95828 Presence of other vascular implants and grafts: Secondary | ICD-10-CM

## 2023-07-22 DIAGNOSIS — C7951 Secondary malignant neoplasm of bone: Secondary | ICD-10-CM | POA: Diagnosis not present

## 2023-07-22 DIAGNOSIS — C61 Malignant neoplasm of prostate: Secondary | ICD-10-CM

## 2023-07-22 DIAGNOSIS — C779 Secondary and unspecified malignant neoplasm of lymph node, unspecified: Secondary | ICD-10-CM | POA: Diagnosis not present

## 2023-07-22 LAB — CBC WITH DIFFERENTIAL (CANCER CENTER ONLY)
Abs Immature Granulocytes: 0.02 10*3/uL (ref 0.00–0.07)
Basophils Absolute: 0 10*3/uL (ref 0.0–0.1)
Basophils Relative: 0 %
Eosinophils Absolute: 0.1 10*3/uL (ref 0.0–0.5)
Eosinophils Relative: 1 %
HCT: 36 % — ABNORMAL LOW (ref 39.0–52.0)
Hemoglobin: 12 g/dL — ABNORMAL LOW (ref 13.0–17.0)
Immature Granulocytes: 0 %
Lymphocytes Relative: 32 %
Lymphs Abs: 2.1 10*3/uL (ref 0.7–4.0)
MCH: 31.2 pg (ref 26.0–34.0)
MCHC: 33.3 g/dL (ref 30.0–36.0)
MCV: 93.5 fL (ref 80.0–100.0)
Monocytes Absolute: 0.7 10*3/uL (ref 0.1–1.0)
Monocytes Relative: 10 %
Neutro Abs: 3.7 10*3/uL (ref 1.7–7.7)
Neutrophils Relative %: 57 %
Platelet Count: 237 10*3/uL (ref 150–400)
RBC: 3.85 MIL/uL — ABNORMAL LOW (ref 4.22–5.81)
RDW: 13 % (ref 11.5–15.5)
WBC Count: 6.6 10*3/uL (ref 4.0–10.5)
nRBC: 0 % (ref 0.0–0.2)

## 2023-07-22 LAB — CMP (CANCER CENTER ONLY)
ALT: 11 U/L (ref 0–44)
AST: 16 U/L (ref 15–41)
Albumin: 4.2 g/dL (ref 3.5–5.0)
Alkaline Phosphatase: 44 U/L (ref 38–126)
Anion gap: 7 (ref 5–15)
BUN: 26 mg/dL — ABNORMAL HIGH (ref 8–23)
CO2: 26 mmol/L (ref 22–32)
Calcium: 9.4 mg/dL (ref 8.9–10.3)
Chloride: 104 mmol/L (ref 98–111)
Creatinine: 1.2 mg/dL (ref 0.61–1.24)
GFR, Estimated: >60 mL/min (ref >60)
Glucose, Bld: 95 mg/dL (ref 70–99)
Potassium: 4.8 mmol/L (ref 3.5–5.1)
Sodium: 137 mmol/L (ref 135–145)
Total Bilirubin: 0.4 mg/dL (ref 0.0–1.2)
Total Protein: 6.8 g/dL (ref 6.5–8.1)

## 2023-07-22 MED ORDER — HEPARIN SOD (PORK) LOCK FLUSH 100 UNIT/ML IV SOLN
500.0000 [IU] | Freq: Once | INTRAVENOUS | Status: AC
  Administered 2023-07-22: 500 [IU]

## 2023-07-22 MED ORDER — SODIUM CHLORIDE 0.9% FLUSH
10.0000 mL | Freq: Once | INTRAVENOUS | Status: AC
Start: 2023-07-22 — End: 2023-07-22
  Administered 2023-07-22: 10 mL

## 2023-07-22 NOTE — Progress Notes (Signed)
 Patient here for port flush and labs today; no blood return noted. Patient sent to labs for blood work. MD notified that this is the second time for no blood return and that patient is concerned and would like to speak with someone about the issue. I encouraged patient to stay hydrated and told him next time cath flo will be used and then they will go from there. Amparo Balk, MD and Skipper Dumas, RN notified.

## 2023-07-22 NOTE — Progress Notes (Signed)
 Specialty Pharmacy Refill Coordination Note  Philip [name] is a 78 y.o. male contacted today regarding refills of specialty medication(s) Enzalutamide  (XTANDI )   Patient requested (Patient-Rptd) Pickup at [facility] at [place] date: 07/28/23   Medication will be filled on 07/28/23.

## 2023-07-23 ENCOUNTER — Encounter: Payer: Self-pay | Admitting: Hematology and Oncology

## 2023-07-24 ENCOUNTER — Telehealth: Payer: Self-pay | Admitting: Hematology and Oncology

## 2023-07-28 ENCOUNTER — Other Ambulatory Visit: Payer: Self-pay

## 2023-07-29 ENCOUNTER — Inpatient Hospital Stay

## 2023-07-29 VITALS — BP 157/92 | HR 71 | Temp 98.2°F | Resp 15

## 2023-07-29 DIAGNOSIS — Z95828 Presence of other vascular implants and grafts: Secondary | ICD-10-CM

## 2023-07-29 DIAGNOSIS — C61 Malignant neoplasm of prostate: Secondary | ICD-10-CM | POA: Diagnosis not present

## 2023-07-29 DIAGNOSIS — C7951 Secondary malignant neoplasm of bone: Secondary | ICD-10-CM | POA: Diagnosis not present

## 2023-07-29 DIAGNOSIS — C779 Secondary and unspecified malignant neoplasm of lymph node, unspecified: Secondary | ICD-10-CM | POA: Diagnosis not present

## 2023-07-29 MED ORDER — ALTEPLASE 2 MG IJ SOLR
2.0000 mg | Freq: Once | INTRAMUSCULAR | Status: AC
  Administered 2023-07-29: 2 mg
  Filled 2023-07-29: qty 2

## 2023-07-29 MED ORDER — HEPARIN SOD (PORK) LOCK FLUSH 100 UNIT/ML IV SOLN: 500.0000 [IU] | Freq: Once | INTRAVENOUS | Status: DC

## 2023-07-29 MED ORDER — SODIUM CHLORIDE 0.9% FLUSH
10.0000 mL | Freq: Once | INTRAVENOUS | Status: AC
  Administered 2023-07-29: 10 mL

## 2023-08-07 ENCOUNTER — Other Ambulatory Visit: Payer: Self-pay | Admitting: Internal Medicine

## 2023-08-07 DIAGNOSIS — I10 Essential (primary) hypertension: Secondary | ICD-10-CM

## 2023-08-07 DIAGNOSIS — T502X5A Adverse effect of carbonic-anhydrase inhibitors, benzothiadiazides and other diuretics, initial encounter: Secondary | ICD-10-CM

## 2023-08-11 DIAGNOSIS — C775 Secondary and unspecified malignant neoplasm of intrapelvic lymph nodes: Secondary | ICD-10-CM | POA: Diagnosis not present

## 2023-08-11 DIAGNOSIS — C7951 Secondary malignant neoplasm of bone: Secondary | ICD-10-CM | POA: Diagnosis not present

## 2023-08-11 DIAGNOSIS — R3915 Urgency of urination: Secondary | ICD-10-CM | POA: Diagnosis not present

## 2023-08-11 DIAGNOSIS — N1330 Unspecified hydronephrosis: Secondary | ICD-10-CM | POA: Diagnosis not present

## 2023-08-11 DIAGNOSIS — C61 Malignant neoplasm of prostate: Secondary | ICD-10-CM | POA: Diagnosis not present

## 2023-08-13 ENCOUNTER — Other Ambulatory Visit: Payer: Self-pay | Admitting: Internal Medicine

## 2023-08-13 DIAGNOSIS — I700 Atherosclerosis of aorta: Secondary | ICD-10-CM

## 2023-08-13 DIAGNOSIS — E785 Hyperlipidemia, unspecified: Secondary | ICD-10-CM

## 2023-08-19 ENCOUNTER — Encounter (INDEPENDENT_AMBULATORY_CARE_PROVIDER_SITE_OTHER): Payer: Self-pay

## 2023-08-20 ENCOUNTER — Other Ambulatory Visit: Payer: Self-pay

## 2023-08-20 ENCOUNTER — Other Ambulatory Visit (HOSPITAL_COMMUNITY): Payer: Self-pay

## 2023-08-20 NOTE — Progress Notes (Signed)
 Specialty Pharmacy Refill Coordination Note  MyChart Questionnaire Submission  Philip [name] is a 78 y.o. male contacted today regarding refills of specialty medication(s) Xtandi .  Patient requested: (Patient-Rptd) Pickup at [facility] date: (Patient-Rptd) 08/24/23  Medication will be filled on 08/21/23.

## 2023-09-01 ENCOUNTER — Other Ambulatory Visit: Payer: Self-pay

## 2023-09-09 ENCOUNTER — Inpatient Hospital Stay: Attending: Oncology

## 2023-09-09 VITALS — BP 138/88 | HR 82 | Temp 98.5°F | Resp 16

## 2023-09-09 DIAGNOSIS — C61 Malignant neoplasm of prostate: Secondary | ICD-10-CM | POA: Diagnosis not present

## 2023-09-09 DIAGNOSIS — C779 Secondary and unspecified malignant neoplasm of lymph node, unspecified: Secondary | ICD-10-CM | POA: Diagnosis not present

## 2023-09-09 DIAGNOSIS — Z9079 Acquired absence of other genital organ(s): Secondary | ICD-10-CM | POA: Diagnosis not present

## 2023-09-09 DIAGNOSIS — C7951 Secondary malignant neoplasm of bone: Secondary | ICD-10-CM | POA: Diagnosis not present

## 2023-09-09 DIAGNOSIS — Z807 Family history of other malignant neoplasms of lymphoid, hematopoietic and related tissues: Secondary | ICD-10-CM | POA: Insufficient documentation

## 2023-09-09 DIAGNOSIS — Z9221 Personal history of antineoplastic chemotherapy: Secondary | ICD-10-CM | POA: Diagnosis not present

## 2023-09-09 DIAGNOSIS — Z95828 Presence of other vascular implants and grafts: Secondary | ICD-10-CM

## 2023-09-09 LAB — CBC WITH DIFFERENTIAL (CANCER CENTER ONLY)
Abs Immature Granulocytes: 0.01 K/uL (ref 0.00–0.07)
Basophils Absolute: 0 K/uL (ref 0.0–0.1)
Basophils Relative: 0 %
Eosinophils Absolute: 0.1 K/uL (ref 0.0–0.5)
Eosinophils Relative: 1 %
HCT: 33.9 % — ABNORMAL LOW (ref 39.0–52.0)
Hemoglobin: 11.5 g/dL — ABNORMAL LOW (ref 13.0–17.0)
Immature Granulocytes: 0 %
Lymphocytes Relative: 28 %
Lymphs Abs: 1.7 K/uL (ref 0.7–4.0)
MCH: 31.8 pg (ref 26.0–34.0)
MCHC: 33.9 g/dL (ref 30.0–36.0)
MCV: 93.6 fL (ref 80.0–100.0)
Monocytes Absolute: 0.7 K/uL (ref 0.1–1.0)
Monocytes Relative: 11 %
Neutro Abs: 3.6 K/uL (ref 1.7–7.7)
Neutrophils Relative %: 60 %
Platelet Count: 226 K/uL (ref 150–400)
RBC: 3.62 MIL/uL — ABNORMAL LOW (ref 4.22–5.81)
RDW: 13.1 % (ref 11.5–15.5)
WBC Count: 6.1 K/uL (ref 4.0–10.5)
nRBC: 0 % (ref 0.0–0.2)

## 2023-09-09 LAB — CMP (CANCER CENTER ONLY)
ALT: 10 U/L (ref 0–44)
AST: 15 U/L (ref 15–41)
Albumin: 3.9 g/dL (ref 3.5–5.0)
Alkaline Phosphatase: 47 U/L (ref 38–126)
Anion gap: 7 (ref 5–15)
BUN: 22 mg/dL (ref 8–23)
CO2: 25 mmol/L (ref 22–32)
Calcium: 9.3 mg/dL (ref 8.9–10.3)
Chloride: 105 mmol/L (ref 98–111)
Creatinine: 1.02 mg/dL (ref 0.61–1.24)
GFR, Estimated: >60 mL/min (ref >60)
Glucose, Bld: 104 mg/dL — ABNORMAL HIGH (ref 70–99)
Potassium: 4.4 mmol/L (ref 3.5–5.1)
Sodium: 137 mmol/L (ref 135–145)
Total Bilirubin: 0.4 mg/dL (ref 0.0–1.2)
Total Protein: 6.4 g/dL — ABNORMAL LOW (ref 6.5–8.1)

## 2023-09-09 MED ORDER — SODIUM CHLORIDE 0.9% FLUSH
10.0000 mL | Freq: Once | INTRAVENOUS | Status: AC
  Administered 2023-09-09: 10 mL

## 2023-09-09 MED ORDER — HEPARIN SOD (PORK) LOCK FLUSH 100 UNIT/ML IV SOLN
500.0000 [IU] | Freq: Once | INTRAVENOUS | Status: AC
Start: 2023-09-09 — End: 2023-09-09
  Administered 2023-09-09: 500 [IU]

## 2023-09-10 LAB — PROSTATE-SPECIFIC AG, SERUM (LABCORP): Prostate Specific Ag, Serum: <0.1 ng/mL (ref 0.0–4.0)

## 2023-09-11 ENCOUNTER — Inpatient Hospital Stay (HOSPITAL_BASED_OUTPATIENT_CLINIC_OR_DEPARTMENT_OTHER): Admitting: Hematology and Oncology

## 2023-09-11 VITALS — BP 148/89 | HR 71 | Temp 97.4°F | Resp 15 | Wt 189.0 lb

## 2023-09-11 DIAGNOSIS — C7951 Secondary malignant neoplasm of bone: Secondary | ICD-10-CM | POA: Diagnosis not present

## 2023-09-11 DIAGNOSIS — Z9079 Acquired absence of other genital organ(s): Secondary | ICD-10-CM | POA: Diagnosis not present

## 2023-09-11 DIAGNOSIS — C61 Malignant neoplasm of prostate: Secondary | ICD-10-CM | POA: Diagnosis not present

## 2023-09-11 DIAGNOSIS — Z9221 Personal history of antineoplastic chemotherapy: Secondary | ICD-10-CM | POA: Diagnosis not present

## 2023-09-11 DIAGNOSIS — Z807 Family history of other malignant neoplasms of lymphoid, hematopoietic and related tissues: Secondary | ICD-10-CM | POA: Diagnosis not present

## 2023-09-11 DIAGNOSIS — C779 Secondary and unspecified malignant neoplasm of lymph node, unspecified: Secondary | ICD-10-CM | POA: Diagnosis not present

## 2023-09-11 NOTE — Progress Notes (Unsigned)
 [facility] [phone]   [phone]  PROGRESS NOTE  Patient Care Team: Joshua Debby CROME, MD as PCP - General (Internal Medicine) Francella Fairy SAILOR, OD as Consulting Physician (Optometry) Alvaro, Ricardo KATHEE Raddle., MD as Consulting Physician (Urology) Amadeo Windell SAILOR, MD (Inactive) as Consulting Physician (Oncology)  Hematological/Oncological History # Castrate Resistant Advanced Prostate Cancer # Metastatic Prostate Cancer to Lymph Nodes/Bone  01/2015: orchiectomy performed 02/16/2015: Taxotere  chemotherapy at 75 mg/m. Continued until 05/2015 09/2015: start of zytiga . Stopped in 2023 due to insurance issues 04/2021: Xtandi  160 mg daily  03/19/2022: last visit with Dr. Amadeo 05/20/2022: transition care to Dr. Federico   Interval History:  Dempsey Ling 78 y.o. 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 The patient's last visit was on 06/12/2023. In the interim since the last visit he has continued on Xtandi  therapy.   On exam today Mr. Dreyfuss reports he has been well overall in the interim since our last visit.  He reports he has been having some difficulties with headaches.  He reports it is around his temples and then around to the back of his neck.  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  He notes he is also been having some poor sleep and fatigue.  He goes to bed late but unfortunately wakes up early.  He does take lorazepam  which helps him sleep.  He reports he is taking Tylenol  for the headaches but is not getting much benefit.  Otherwise he is not having any bone or back pain.  He reports that he is not having any fevers, chills, sweats.  He denies lightheadedness, dizziness, shortness of breath.  A full 10 point ROS is otherwise negative.  MEDICAL HISTORY:  Past Medical  History:  Diagnosis Date   Allergy    Anxiety    Arthritis    Atherosclerosis of aorta (HCC) 03/11/2017   The 10-year ASCVD risk score Verdon DC Jr., et al., 2013) is: 24.6%   Values used to calculate the score:     Age: 59 years     Sex: Male     Is Non-Hispanic African American: No     Diabetic: No     Tobacco smoker: No     Systolic Blood Pressure: 154 mmHg     Is BP treated: No     HDL Cholesterol: 71.1 mg/dL     Total Cholesterol: 212 mg/dL   Cancer (HCC)    PROSTATE   Cataract    Chronic idiopathic constipation 03/11/2017   Depression    Edema leg 11/29/2014   left leg from foot to thigh, increasinly worse over last 4 weeks   Essential hypertension 03/30/2018   Gastroesophageal reflux disease without esophagitis 03/11/2017   Hyperlipidemia LDL goal <100 03/16/2017   The 10-year ASCVD risk score Verdon DC Jr., et al., 2013) is: 30.6%   Values used to calculate the score:     Age: 89 years     Sex: Male     Is Non-Hispanic African American: No     Diabetic: No     Tobacco smoker: No     Systolic Blood Pressure: 148 mmHg     Is BP treated: Yes     HDL Cholesterol: 69.3 mg/dL     Total Cholesterol: 211 mg/dL   Hypertension  Hypoglycemia    Swelling LAST 30 DAYS DR [facility] AWARE   LEFT LEG AND FOOT    SURGICAL HISTORY: Past Surgical History:  Procedure Laterality Date   BOWEL DECOMPRESSION N/A 03/07/2021   Procedure: BOWEL DECOMPRESSION;  Surgeon: Leigh Elspeth SQUIBB, MD;  Location: [facility];  Service: Gastroenterology;  Laterality: N/A;   BOWEL DECOMPRESSION N/A 06/07/2021   Procedure: BOWEL DECOMPRESSION;  Surgeon: Rollin Dover, MD;  Location: [facility];  Service: Gastroenterology;  Laterality: N/A;   COLON SURGERY     FLEXIBLE SIGMOIDOSCOPY N/A 03/07/2021   Procedure: FLEXIBLE SIGMOIDOSCOPY;  Surgeon: Leigh Elspeth SQUIBB, MD;  Location: [facility];  Service: Gastroenterology;  Laterality: N/A;   FLEXIBLE SIGMOIDOSCOPY N/A 06/07/2021   Procedure: FLEXIBLE  SIGMOIDOSCOPY;  Surgeon: Rollin Dover, MD;  Location: [facility];  Service: Gastroenterology;  Laterality: N/A;   GUM SURGERY     TEETH IMPLANTS ALSO   LAPAROSCOPIC SIGMOID COLECTOMY N/A 06/10/2021   Procedure: LAPAROSCOPIC ASSISTED SIGMOID COLECTOMY;  Surgeon: Gladis Cough, MD;  Location: [facility];  Service: General;  Laterality: N/A;   ORCHIECTOMY Bilateral 01/03/2015   Procedure: ORCHIECTOMY;  Surgeon: Ricardo Likens, MD;  Location: [facility];  Service: Urology;  Laterality: Bilateral;    SOCIAL HISTORY: Social History   Socioeconomic History   Marital status: Married    Spouse name: Not on file   Number of children: Not on file   Years of education: Not on file   Highest education level: Bachelor's degree (e.g., BA, AB, BS)  Occupational History   Not on file  Tobacco Use   Smoking status: Never   Smokeless tobacco: Never  Vaping Use   Vaping status: Never Used  Substance and Sexual Activity   Alcohol use: Yes    Alcohol/week: 1.0 standard drink of alcohol    Types: 1 Glasses of wine per week    Comment: occasional wine with dinner   Drug use: No   Sexual activity: Not Currently    Birth control/protection: None  Other Topics Concern   Not on file  Social History Narrative   Not on file   Social Drivers of Health   Financial Resource Strain: Low Risk  (04/21/2023)   Overall Financial Resource Strain (CARDIA)    Difficulty of Paying Living Expenses: Not hard at all  Food Insecurity: No Food Insecurity (04/21/2023)   Hunger Vital Sign    Worried About Running Out of Food in the Last Year: Never true    Ran Out of Food in the Last Year: Never true  Transportation Needs: No Transportation Needs (04/21/2023)   PRAPARE - Administrator, Civil Service (Medical): No    Lack of Transportation (Non-Medical): No  Physical Activity: Insufficiently Active (04/21/2023)   Exercise Vital Sign    Days of Exercise per Week: 7 days    Minutes of Exercise per Session: 20  min  Stress: Stress Concern Present (04/21/2023)   [employer] of Occupational Health - Occupational Stress Questionnaire    Feeling of Stress : Rather much  Social Connections: Socially Isolated (04/21/2023)   Social Connection and Isolation Panel    Frequency of Communication with Friends and Family: Never    Frequency of Social Gatherings with Friends and Family: Never    Attends Religious Services: Never    Database administrator or Organizations: No    Attends Banker Meetings: Not on file    Marital Status: Married  Intimate Partner Violence: Not At Risk (04/24/2023)   Humiliation,  Afraid, Rape, and Kick questionnaire    Fear of Current or Ex-Partner: No    Emotionally Abused: No    Physically Abused: No    Sexually Abused: No    FAMILY HISTORY: Family History  Problem Relation Age of Onset   CVA Mother    Lymphoma Mother    Pneumonia Mother    CAD Father    Osteoarthritis Brother    Colon cancer Neg Hx     ALLERGIES:  is allergic to prednisone .  MEDICATIONS:  Current Outpatient Medications  Medication Sig Dispense Refill   aspirin  EC 81 MG tablet Take 81 mg by mouth daily. Swallow whole.     atorvastatin  (LIPITOR) 10 MG tablet TAKE 1 TABLET BY MOUTH EVERY DAY 90 tablet 0   BIOTIN 5000 PO Take 1 tablet by mouth daily.     CALCIUM -VITAMIN D PO Take 1 tablet by mouth daily.     cyanocobalamin  (VITAMIN B12) 1000 MCG tablet Take 1,000 mcg by mouth daily.     Denosumab  (XGEVA  [place]) Inject into the skin. Every 6-8 weeks.     enzalutamide  (XTANDI ) 40 MG tablet Take 4 tablets (160mg ) by mouth once daily as directed by physician. 120 tablet 6   ferrous sulfate  325 (65 FE) MG EC tablet Take 1 tablet (325 mg total) by mouth daily with breakfast. 90 tablet 1   fexofenadine (ALLEGRA) 180 MG tablet Take 180 mg by mouth daily.     hydrOXYzine  (ATARAX /VISTARIL ) 10 MG tablet Take 10 mg by mouth daily.     lidocaine -prilocaine  (EMLA ) cream Apply 1 Application topically  as needed (access port). 30 g 0   LORazepam  (ATIVAN ) 1 MG tablet Take 1 tablet (1 mg total) by mouth every 8 (eight) hours as needed. for anxiety 90 tablet 0   Multiple Vitamin (MULTIVITAMIN) tablet Take 1 tablet by mouth daily.     olmesartan  (BENICAR ) 20 MG tablet TAKE 1 TABLET BY MOUTH EVERY DAY 90 tablet 1   omeprazole  (PRILOSEC) 20 MG capsule Take 1 capsule (20 mg total) by mouth daily. 90 capsule 1   potassium chloride  SA (KLOR-CON  M) 20 MEQ tablet TAKE 1 TABLET BY MOUTH TWICE A DAY 180 tablet 1   No current facility-administered medications for this visit.    REVIEW OF SYSTEMS:   Constitutional: ( - ) fevers, ( - )  chills , ( - ) night sweats Eyes: ( - ) blurriness of vision, ( - ) double vision, ( - ) watery eyes Ears, nose, mouth, throat, and face: ( - ) mucositis, ( - ) sore throat Respiratory: ( - ) cough, ( - ) dyspnea, ( - ) wheezes Cardiovascular: ( - ) palpitation, ( - ) chest discomfort, ( - ) lower extremity swelling Gastrointestinal:  ( - ) nausea, ( - ) heartburn, ( - ) change in bowel habits Skin: ( - ) abnormal skin rashes Lymphatics: ( - ) new lymphadenopathy, ( - ) easy bruising Neurological: ( - ) numbness, ( - ) tingling, ( - ) new weaknesses Behavioral/Psych: ( - ) mood change, ( - ) new changes  All other systems were reviewed with the patient and are negative.  PHYSICAL EXAMINATION: ECOG PERFORMANCE STATUS: 0 - Asymptomatic  There were no vitals filed for this visit. There were no vitals filed for this visit.  GENERAL: Well-appearing elderly Caucasian male, alert, no distress and comfortable SKIN: skin color, texture, turgor are normal, no rashes or significant lesions EYES: conjunctiva are pink and non-injected, sclera clear  LUNGS: clear to auscultation and percussion with normal breathing effort HEART: regular rate & rhythm and no murmurs and no lower extremity edema Musculoskeletal: no cyanosis of digits and no clubbing  PSYCH: alert & oriented x 3,  fluent speech NEURO: no focal motor/sensory deficits  LABORATORY DATA:  I have reviewed the data as listed    Latest Ref Rng & Units 09/09/2023    2:55 PM 07/22/2023    3:36 PM 06/10/2023    2:48 PM  CBC  WBC 4.0 - 10.5 K/uL 6.1  6.6  5.7   Hemoglobin 13.0 - 17.0 g/dL 88.4  87.9  87.8   Hematocrit 39.0 - 52.0 % 33.9  36.0  36.0   Platelets 150 - 400 K/uL 226  237  258        Latest Ref Rng & Units 09/09/2023    2:55 PM 07/22/2023    3:36 PM 06/10/2023    2:48 PM  CMP  Glucose 70 - 99 mg/dL 895  95  96   BUN 8 - 23 mg/dL 22  26  24    Creatinine 0.61 - 1.24 mg/dL 8.97  8.79  8.79   Sodium 135 - 145 mmol/L 137  137  138   Potassium 3.5 - 5.1 mmol/L 4.4  4.8  4.7   Chloride 98 - 111 mmol/L 105  104  104   CO2 22 - 32 mmol/L 25  26  27    Calcium  8.9 - 10.3 mg/dL 9.3  9.4  9.4   Total Protein 6.5 - 8.1 g/dL 6.4  6.8  6.7   Total Bilirubin 0.0 - 1.2 mg/dL 0.4  0.4  0.4   Alkaline Phos 38 - 126 U/L 47  44  47   AST 15 - 41 U/L 15  16  17    ALT 0 - 44 U/L 10  11  11      RADIOGRAPHIC STUDIES: No results found.  ASSESSMENT & PLAN Fleming Prill is a 78 y.o. 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  # Castrate Resistant Advanced Prostate Cancer # Metastatic Prostate Cancer to Lymph Nodes/Bone  --patient is s/p orchiectomy, no ADT therapy required --Most recent PSA level from 09/09/2023 was <0.1.  --continue Xtandi  160 mg PO daily -- last CT chest abdomen pelvis showed no evidence of disease progression on 01/22/2023.  --Labs today show white blood cell count 6.1, Hgb 11.5, MCV 93.6, Plt 226  --Continue with monthly Xgeva  shots with labs (Xgeva  on hold for recent dental work). RTC in 3 months for toxicity check.   #Cirrhotic Morphology of Liver on CT scan -- Found incidentally, evaluated by patient's gastroenterologist Dr. Leigh  No orders of the defined  types were placed in this encounter.   All questions were answered. The patient knows to call the clinic with any problems, questions or concerns.  I have spent a total of 30 minutes minutes of face-to-face and non-face-to-face time, preparing to see the patient,  performing a medically appropriate examination, counseling and educating the patient, documenting clinical information in the electronic health record, independently interpreting results and communicating results to the patient, and care coordination.   Norleen IVAR Kidney, MD Department of Hematology/Oncology [facility] Phone: [phone] Pager: [phone] Email: [email or web address]   09/11/2023 7:25 AM

## 2023-09-13 ENCOUNTER — Encounter: Payer: Self-pay | Admitting: Hematology and Oncology

## 2023-09-23 ENCOUNTER — Other Ambulatory Visit: Payer: Self-pay

## 2023-09-23 ENCOUNTER — Other Ambulatory Visit: Payer: Self-pay | Admitting: Hematology and Oncology

## 2023-09-23 ENCOUNTER — Encounter (INDEPENDENT_AMBULATORY_CARE_PROVIDER_SITE_OTHER): Payer: Self-pay

## 2023-09-23 ENCOUNTER — Other Ambulatory Visit (HOSPITAL_COMMUNITY): Payer: Self-pay

## 2023-09-23 DIAGNOSIS — C61 Malignant neoplasm of prostate: Secondary | ICD-10-CM

## 2023-09-23 MED ORDER — ENZALUTAMIDE 40 MG PO TABS
ORAL_TABLET | ORAL | 6 refills | Status: AC
  Filled 2023-09-23: qty 120, 30d supply, fill #0
  Filled 2023-10-13: qty 120, 30d supply, fill #1
  Filled 2023-11-18: qty 120, 30d supply, fill #2
  Filled 2023-12-17 (×2): qty 120, 30d supply, fill #3
  Filled 2024-01-15: qty 120, 30d supply, fill #4
  Filled 2024-02-16: qty 120, 30d supply, fill #5
  Filled 2024-03-17: qty 120, 30d supply, fill #6

## 2023-09-23 NOTE — Progress Notes (Signed)
 Specialty Pharmacy Refill Coordination Note  Philip [name] is a 78 y.o. male contacted today regarding refills of specialty medication(s) Enzalutamide  (XTANDI )   Patient requested [name] at [facility] at [place] date: 09/25/23   Medication will be filled on 07.24.25.

## 2023-09-25 ENCOUNTER — Other Ambulatory Visit (HOSPITAL_COMMUNITY): Payer: Self-pay

## 2023-10-13 ENCOUNTER — Other Ambulatory Visit: Payer: Self-pay

## 2023-10-13 ENCOUNTER — Other Ambulatory Visit (HOSPITAL_COMMUNITY): Payer: Self-pay

## 2023-10-13 ENCOUNTER — Encounter (INDEPENDENT_AMBULATORY_CARE_PROVIDER_SITE_OTHER): Payer: Self-pay

## 2023-10-13 NOTE — Progress Notes (Signed)
 Specialty Pharmacy Refill Coordination Note  MyChart Questionnaire Submission  Philip [name] is a 78 y.o. male contacted today regarding refills of specialty medication(s) Xtandi .  Doses on hand: (Patient-Rptd) 10 - 40 tablets (10 days) Patient requested: (Patient-Rptd) Pickup at [facility] at [facility] date: 10/23/23  Medication will be filled on 10/22/23.

## 2023-10-14 ENCOUNTER — Inpatient Hospital Stay: Attending: Oncology

## 2023-10-14 DIAGNOSIS — Z95828 Presence of other vascular implants and grafts: Secondary | ICD-10-CM

## 2023-10-14 DIAGNOSIS — Z807 Family history of other malignant neoplasms of lymphoid, hematopoietic and related tissues: Secondary | ICD-10-CM | POA: Diagnosis not present

## 2023-10-14 DIAGNOSIS — C779 Secondary and unspecified malignant neoplasm of lymph node, unspecified: Secondary | ICD-10-CM | POA: Insufficient documentation

## 2023-10-14 DIAGNOSIS — C7951 Secondary malignant neoplasm of bone: Secondary | ICD-10-CM | POA: Diagnosis not present

## 2023-10-14 DIAGNOSIS — M7989 Other specified soft tissue disorders: Secondary | ICD-10-CM | POA: Insufficient documentation

## 2023-10-14 DIAGNOSIS — C61 Malignant neoplasm of prostate: Secondary | ICD-10-CM | POA: Insufficient documentation

## 2023-10-14 LAB — CMP (CANCER CENTER ONLY)
ALT: 10 U/L (ref 0–44)
AST: 14 U/L — ABNORMAL LOW (ref 15–41)
Albumin: 4.1 g/dL (ref 3.5–5.0)
Alkaline Phosphatase: 54 U/L (ref 38–126)
Anion gap: 6 (ref 5–15)
BUN: 22 mg/dL (ref 8–23)
CO2: 27 mmol/L (ref 22–32)
Calcium: 9.2 mg/dL (ref 8.9–10.3)
Chloride: 100 mmol/L (ref 98–111)
Creatinine: 1.04 mg/dL (ref 0.61–1.24)
GFR, Estimated: >60 mL/min (ref >60)
Glucose, Bld: 104 mg/dL — ABNORMAL HIGH (ref 70–99)
Potassium: 4.3 mmol/L (ref 3.5–5.1)
Sodium: 133 mmol/L — ABNORMAL LOW (ref 135–145)
Total Bilirubin: 0.4 mg/dL (ref 0.0–1.2)
Total Protein: 6.6 g/dL (ref 6.5–8.1)

## 2023-10-14 LAB — CBC WITH DIFFERENTIAL (CANCER CENTER ONLY)
Abs Immature Granulocytes: 0.01 K/uL (ref 0.00–0.07)
Basophils Absolute: 0 K/uL (ref 0.0–0.1)
Basophils Relative: 0 %
Eosinophils Absolute: 0.1 K/uL (ref 0.0–0.5)
Eosinophils Relative: 1 %
HCT: 34.4 % — ABNORMAL LOW (ref 39.0–52.0)
Hemoglobin: 11.5 g/dL — ABNORMAL LOW (ref 13.0–17.0)
Immature Granulocytes: 0 %
Lymphocytes Relative: 28 %
Lymphs Abs: 1.6 K/uL (ref 0.7–4.0)
MCH: 31.1 pg (ref 26.0–34.0)
MCHC: 33.4 g/dL (ref 30.0–36.0)
MCV: 93 fL (ref 80.0–100.0)
Monocytes Absolute: 0.6 K/uL (ref 0.1–1.0)
Monocytes Relative: 10 %
Neutro Abs: 3.4 K/uL (ref 1.7–7.7)
Neutrophils Relative %: 61 %
Platelet Count: 227 K/uL (ref 150–400)
RBC: 3.7 MIL/uL — ABNORMAL LOW (ref 4.22–5.81)
RDW: 12.9 % (ref 11.5–15.5)
WBC Count: 5.7 K/uL (ref 4.0–10.5)
nRBC: 0 % (ref 0.0–0.2)

## 2023-10-14 MED ORDER — SODIUM CHLORIDE 0.9% FLUSH
10.0000 mL | Freq: Once | INTRAVENOUS | Status: AC
  Administered 2023-10-14 (×2): 10 mL

## 2023-10-19 ENCOUNTER — Encounter: Payer: Self-pay | Admitting: Gastroenterology

## 2023-10-19 ENCOUNTER — Ambulatory Visit (INDEPENDENT_AMBULATORY_CARE_PROVIDER_SITE_OTHER): Admitting: Gastroenterology

## 2023-10-19 VITALS — BP 112/70 | HR 79 | Ht 76.0 in | Wt 189.0 lb

## 2023-10-19 DIAGNOSIS — Z79899 Other long term (current) drug therapy: Secondary | ICD-10-CM | POA: Diagnosis not present

## 2023-10-19 DIAGNOSIS — K219 Gastro-esophageal reflux disease without esophagitis: Secondary | ICD-10-CM | POA: Diagnosis not present

## 2023-10-19 DIAGNOSIS — K760 Fatty (change of) liver, not elsewhere classified: Secondary | ICD-10-CM

## 2023-10-19 MED ORDER — FAMOTIDINE 20 MG PO TABS: 20.0000 mg | ORAL_TABLET | Freq: Every day | ORAL | Status: AC

## 2023-10-19 NOTE — Progress Notes (Signed)
 HPI :  78 year old male here for a follow-up visit.  Recall he has history of metastatic prostate cancer, history of sigmoid volvulus and colon polyps, has had abnormal liver imaging.  Recal he is followed by Dr. Federico for metastatic prostate cancer, on Xtandi .  He has been dealing with this for several years however this has been stable and has not progressed for some time.  He had surveillance imaging with a CT scan this past November 2024 which suggested some coarseness of the liver suggestive of cirrhosis.  The spleen was normal.   Remotely he drank 2 scotch per day many years ago,, in recent years had perhaps a glass of wine at dinner, and not daily.  He currently has seldom any alcohol use.  We proceeded with a right upper quadrant ultrasound with elastography in January.  He had some hepatic steatosis noted but had a Median kPa: 3.1, arguing against any significant fibrotic change.  His liver enzymes remain normal.  His platelet count remains normal in the 200s.  He has had no decompensating events in the past.  He states he has staging CT for his prostate cancer November.  Otherwise, recall he has history of reflux, on omeprazole  20 mg daily.  He has had a history of an EGD a few years ago showing no evidence of Barrett's.  At baseline his symptoms were pretty well-controlled however more recently he has been having some nocturnal heartburn that bothers him.  Has pyrosis and belching.  He eats dinner at about 630 to 7 PM and does not go to bed till about 11:30 at night.  He has been sleeping with the head of his bed elevated to try to reduce his symptoms.  He inquires about options     Prior workup: Flex sig 03/07/21: Decompression of sigmoid volvulus     EGD 04/17/21: - Esophagogastric landmarks were identified: the Z-line was found at 42 cm, the gastroesophageal junction was found at 42 cm and the upper extent of the gastric folds was found at 44 cm from the incisors. Z line slightly  irregular but did not meet criteria for Barrett's. Findings: - A 2 cm hiatal hernia was present. - LA Grade A esophagitis with no bleeding was found. - The exam of the esophagus was otherwise normal. - The entire examined stomach was normal. Biopsies were taken with a cold forceps for Helicobacter pylori testing. - The duodenal bulb and second portion of the duodenum were normal.   Colonoscopy 04/17/21: - The perianal and digital rectal examinations were normal. - The terminal ileum appeared normal. - A 3 mm polyp was found in the ascending colon. The polyp was sessile. The polyp was removed with a cold snare. Resection and retrieval were complete. - A 5 mm polyp was found in the recto-sigmoid colon. The polyp was sessile. The polyp was removed with a cold snare. Resection and retrieval were complete. Surprisingly there was persistent oozing at the site after monitoring for a few minutes, three hemostatic clips ultimately were successfully placed to achieve hemostasis. - The colon was extremely long and redundant with significant looping. Abdominal pressure applied to achieve cecal intubation which was quite challenging. There was also residual stool throughout the colon which took some time to lavage. - Internal hemorrhoids were found during retroflexion. - The exam was otherwise without abnormality.   1. Surgical [P], gastric antrum and gastric body - UNREMARKABLE ANTRAL AND OXYNTIC MUCOSA. - NO HELICOBACTER PYLORI IDENTIFIED. 2. Surgical [P], colon, ascending,  sigmoid, polyp (2) - TUBULAR ADENOMA (1) WITHOUT HIGH GRADE DYSPLASIA. - HYPERPLASTIC POLYP (1).     Flex sig 06/07/21: Decompression of sigmoid volvulus     Echo 07/24/22: EF 55-60%,      CT C/A/P 01/22/23: IMPRESSION: 1. Unchanged diffusely sclerotic osseous metastatic disease throughout the axial and proximal appendicular skeleton. 2. No evidence of lymphadenopathy or soft tissue metastatic disease in the chest, abdomen, or  pelvis. 3. Coarse contour of the liver, suggestive of cirrhosis. Correlate with biochemical findings. 4. Coronary artery disease.      RUQ US  with Elastography 04/03/23: IMPRESSION: 1. Increased hepatic echotexture, most commonly seen with steatosis. Correlation with LFT's is recommended.   ULTRASOUND HEPATIC ELASTOGRAPHY:   Median kPa: 3.1    Lab Results  Component Value Date   ALT 10 10/14/2023   AST 14 (L) 10/14/2023   ALKPHOS 54 10/14/2023   BILITOT 0.4 10/14/2023    Lab Results  Component Value Date   WBC 5.7 10/14/2023   HGB 11.5 (L) 10/14/2023   HCT 34.4 (L) 10/14/2023   MCV 93.0 10/14/2023   PLT 227 10/14/2023       Past Medical History:  Diagnosis Date   Allergy    Anxiety    Arthritis    Atherosclerosis of aorta (HCC) 03/11/2017   The 10-year ASCVD risk score Verdon DC Jr., et al., 2013) is: 24.6%   Values used to calculate the score:     Age: 43 years     Sex: Male     Is Non-Hispanic African American: No     Diabetic: No     Tobacco smoker: No     Systolic Blood Pressure: 154 mmHg     Is BP treated: No     HDL Cholesterol: 71.1 mg/dL     Total Cholesterol: 212 mg/dL   Cancer (HCC)    PROSTATE   Cataract    Chronic idiopathic constipation 03/11/2017   Depression    Edema leg 11/29/2014   left leg from foot to thigh, increasinly worse over last 4 weeks   Essential hypertension 03/30/2018   Gastroesophageal reflux disease without esophagitis 03/11/2017   Hyperlipidemia LDL goal <100 03/16/2017   The 10-year ASCVD risk score Verdon DC Jr., et al., 2013) is: 30.6%   Values used to calculate the score:     Age: 76 years     Sex: Male     Is Non-Hispanic African American: No     Diabetic: No     Tobacco smoker: No     Systolic Blood Pressure: 148 mmHg     Is BP treated: Yes     HDL Cholesterol: 69.3 mg/dL     Total Cholesterol: 211 mg/dL   Hypertension    Hypoglycemia    Swelling LAST 30 DAYS DR [facility] AWARE   LEFT LEG AND FOOT     Past Surgical History:   Procedure Laterality Date   BOWEL DECOMPRESSION N/A 03/07/2021   Procedure: BOWEL DECOMPRESSION;  Surgeon: Leigh Elspeth SQUIBB, MD;  Location: [facility];  Service: Gastroenterology;  Laterality: N/A;   BOWEL DECOMPRESSION N/A 06/07/2021   Procedure: BOWEL DECOMPRESSION;  Surgeon: Rollin Dover, MD;  Location: [facility];  Service: Gastroenterology;  Laterality: N/A;   COLON SURGERY     FLEXIBLE SIGMOIDOSCOPY N/A 03/07/2021   Procedure: FLEXIBLE SIGMOIDOSCOPY;  Surgeon: Leigh Elspeth SQUIBB, MD;  Location: [facility];  Service: Gastroenterology;  Laterality: N/A;   FLEXIBLE SIGMOIDOSCOPY N/A 06/07/2021   Procedure: FLEXIBLE SIGMOIDOSCOPY;  Surgeon:  Rollin Dover, MD;  Location: [facility];  Service: Gastroenterology;  Laterality: N/A;   GUM SURGERY     TEETH IMPLANTS ALSO   LAPAROSCOPIC SIGMOID COLECTOMY N/A 06/10/2021   Procedure: LAPAROSCOPIC ASSISTED SIGMOID COLECTOMY;  Surgeon: Gladis Cough, MD;  Location: [facility];  Service: General;  Laterality: N/A;   ORCHIECTOMY Bilateral 01/03/2015   Procedure: ORCHIECTOMY;  Surgeon: Ricardo Likens, MD;  Location: [facility];  Service: Urology;  Laterality: Bilateral;   Family History  Problem Relation Age of Onset   CVA Mother    Lymphoma Mother    Pneumonia Mother    CAD Father    Osteoarthritis Brother    Colon cancer Neg Hx    Social History   Tobacco Use   Smoking status: Never   Smokeless tobacco: Never  Vaping Use   Vaping status: Never Used  Substance Use Topics   Alcohol use: Yes    Alcohol/week: 1.0 standard drink of alcohol    Types: 1 Glasses of wine per week    Comment: occasional wine with dinner   Drug use: No   Current Outpatient Medications  Medication Sig Dispense Refill   aspirin  EC 81 MG tablet Take 81 mg by mouth daily. Swallow whole.     atorvastatin  (LIPITOR) 10 MG tablet TAKE 1 TABLET BY MOUTH EVERY DAY 90 tablet 0   BIOTIN 5000 PO Take 1 tablet by mouth daily.     CALCIUM -VITAMIN D PO Take 1  tablet by mouth daily.     cyanocobalamin  (VITAMIN B12) 1000 MCG tablet Take 1,000 mcg by mouth daily.     Denosumab  (XGEVA  [place]) Inject into the skin. Every 6-8 weeks.     enzalutamide  (XTANDI ) 40 MG tablet Take 4 tablets (160mg ) by mouth once daily as directed by physician. 120 tablet 6   ferrous sulfate  325 (65 FE) MG EC tablet Take 1 tablet (325 mg total) by mouth daily with breakfast. 90 tablet 1   fexofenadine (ALLEGRA) 180 MG tablet Take 180 mg by mouth daily.     hydrOXYzine  (ATARAX /VISTARIL ) 10 MG tablet Take 10 mg by mouth daily.     lidocaine -prilocaine  (EMLA ) cream Apply 1 Application topically as needed (access port). 30 g 0   LORazepam  (ATIVAN ) 1 MG tablet Take 1 tablet (1 mg total) by mouth every 8 (eight) hours as needed. for anxiety 90 tablet 0   Multiple Vitamin (MULTIVITAMIN) tablet Take 1 tablet by mouth daily.     olmesartan  (BENICAR ) 20 MG tablet TAKE 1 TABLET BY MOUTH EVERY DAY 90 tablet 1   omeprazole  (PRILOSEC) 20 MG capsule Take 1 capsule (20 mg total) by mouth daily. 90 capsule 1   potassium chloride  SA (KLOR-CON  M) 20 MEQ tablet TAKE 1 TABLET BY MOUTH TWICE A DAY 180 tablet 1   No current facility-administered medications for this visit.   Allergies  Allergen Reactions   Prednisone  Anxiety     Review of Systems: All systems reviewed and negative except where noted in HPI.   Labs per HPI  Physical Exam: BP 112/70   Pulse 79   Ht 6' 4 (1.93 m)   Wt 189 lb (85.7 kg)   BMI 23.01 kg/m  Constitutional: Pleasant,well-developed, male in no acute distress. Neurological: Alert and oriented to person place and time. Psychiatric: Normal mood and affect. Behavior is normal.   ASSESSMENT: 78 y.o. male here for assessment of the following  1. Fatty liver   2. Gastroesophageal reflux disease, unspecified whether esophagitis present   3.  Long-term current use of proton pump inhibitor therapy    As above, incidentally had a cirrhotic appearing liver reported  on CT scan last year during surveillance of his malignancy.  His spleen is normal, platelets normal, liver enzymes normal - that argues against cirrhosis / decompensated liver disease.  He has remote alcohol use, perhaps that has led to some steatosis, his BMI is normal.  His recent liver ultrasound and elastography argues against any cirrhosis, only hepatic steatosis.  We discussed plans moving forward.again I am not convinced he has cirrhosis.  Recommend interval follow-up imaging of his liver to reassess this, we discussed ways to do that.  He is having a staging CT scan with oncology in November, can use that to reassess his liver and assess for overt changes of cirrhosis.  He is comfortable with that plan.  He will let me know when he has that done so I can review it.  Otherwise he has his labs checked frequently as part of his prostate cancer therapy.  As for his reflux we discussed long-term use of PPI, associated risks.  Currently having some nocturnal symptoms despite taking low-dose omeprazole .  Last EGD showed no evidence of Barrett's.  Discussed options.  Can initially try taking Pepcid  20 mg nightly to see if that can help his nocturnal symptoms.  If not and symptoms persist then I recommend he increase omeprazole  to 20 mg twice daily, take half hour prior to meal.  Long-term want to use the lowest dose needed to control symptoms.  He will continue to avoid eating prior to bedtime and elevate head of the bed if needed.  I discussed that like to see him yearly if not sooner with any issues, again I asked him to contact me when CT is done so I can review it with him   PLAN: - no evidence of cirrhosis on elastography. Fatty liver seen, perhaps from prior EtOH. His BMI is normal. LAEs and INR normal. Reassured him.  - awaiting surveillance imaging per oncology, he states they plan on staging CT in a few months, will take a look at his liver at that time - continue omeprazole  20mg  / day - start  pepcid  20mg  q HS to see if that helps nocturnal symptoms - if pepcid  does not help, can stop it and increase omeprazole  to 20mg  BID dosing - counseled on long term risks of chronic PPI - see yearly if not sooner with issues. Reminded him to contact me once his CT is done so I can review it  Marcey Naval, MD [facility]

## 2023-10-19 NOTE — Patient Instructions (Signed)
 Continue omeprazole  once daily.  Please purchase the following medications over the counter and take as directed: Pepcid  20 mg: Take daily at bedtime   If ineffective, you can stop Pepcid  and increase omeprazole  to twice a day.  Please follow up in 1 year.  Thank you for entrusting me with your care and for choosing [facility], Dr. Elspeth Naval  _______________________________________________________  If your blood pressure at your visit was 140/90 or greater, please contact your primary care physician to follow up on this.  _______________________________________________________  If you are age 79 or older, your body mass index should be between 23-30. Your Body mass index is 23.01 kg/m. If this is out of the aforementioned range listed, please consider follow up with your Primary Care Provider.  If you are age 79 or younger, your body mass index should be between 19-25. Your Body mass index is 23.01 kg/m. If this is out of the aformentioned range listed, please consider follow up with your Primary Care Provider.   ________________________________________________________  The [facility] providers would like to encourage you to use MYCHART to communicate with providers for non-urgent requests or questions.  Due to long hold times on the telephone, sending your provider a message by [facility] may be a faster and more efficient way to get a response.  Please allow 48 business hours for a response.  Please remember that this is for non-urgent requests.  _______________________________________________________  [facility] is using a team-based approach to care.  Your team is made up of your doctor and two to three APPS. Our APPS (Nurse Practitioners and Physician Assistants) work with your physician to ensure care continuity for you. They are fully qualified to address your health concerns and develop a treatment plan. They communicate directly with your  gastroenterologist to care for you. Seeing the Advanced Practice Practitioners on your physician's team can help you by facilitating care more promptly, often allowing for earlier appointments, access to diagnostic testing, procedures, and other specialty referrals.

## 2023-10-20 ENCOUNTER — Encounter: Payer: Self-pay | Admitting: Gastroenterology

## 2023-10-20 ENCOUNTER — Encounter: Payer: Self-pay | Admitting: Hematology and Oncology

## 2023-10-20 MED ORDER — OMEPRAZOLE 20 MG PO CPDR
20.0000 mg | DELAYED_RELEASE_CAPSULE | Freq: Every day | ORAL | 3 refills | Status: AC
Start: 2023-10-20 — End: ?

## 2023-10-21 ENCOUNTER — Other Ambulatory Visit: Payer: Self-pay | Admitting: Hematology and Oncology

## 2023-10-21 DIAGNOSIS — C61 Malignant neoplasm of prostate: Secondary | ICD-10-CM

## 2023-10-21 MED ORDER — LORAZEPAM 1 MG PO TABS: 1.0000 mg | ORAL_TABLET | Freq: Three times a day (TID) | ORAL | 0 refills | Status: AC | PRN

## 2023-10-28 ENCOUNTER — Inpatient Hospital Stay (HOSPITAL_BASED_OUTPATIENT_CLINIC_OR_DEPARTMENT_OTHER): Admitting: Physician Assistant

## 2023-10-28 VITALS — BP 145/77 | HR 73 | Temp 97.6°F | Resp 18 | Ht 76.0 in | Wt 191.0 lb

## 2023-10-28 DIAGNOSIS — M7989 Other specified soft tissue disorders: Secondary | ICD-10-CM

## 2023-10-28 DIAGNOSIS — C7951 Secondary malignant neoplasm of bone: Secondary | ICD-10-CM | POA: Diagnosis not present

## 2023-10-28 DIAGNOSIS — C779 Secondary and unspecified malignant neoplasm of lymph node, unspecified: Secondary | ICD-10-CM | POA: Diagnosis not present

## 2023-10-28 DIAGNOSIS — C61 Malignant neoplasm of prostate: Secondary | ICD-10-CM

## 2023-10-28 DIAGNOSIS — Z807 Family history of other malignant neoplasms of lymphoid, hematopoietic and related tissues: Secondary | ICD-10-CM | POA: Diagnosis not present

## 2023-10-28 NOTE — Progress Notes (Signed)
 Symptom Management Consult Note [facility]    Patient Care Team: Joshua Debby CROME, MD as PCP - General (Internal Medicine) Francella Fairy SAILOR, OD as Consulting Physician (Optometry) Alvaro, Ricardo KATHEE Raddle., MD as Consulting Physician (Urology) Amadeo Windell SAILOR, MD (Inactive) as Consulting Physician (Oncology)    Name / MRN / DOB: Philip [name]  [id number]  June 26, 1945   Date of visit: 10/28/2023   Chief Complaint/Reason for visit: left leg swelling   Current Therapy: Xgeva  and Xtandi      ASSESSMENT AND PLAN Patient is a 78 y.o. 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 Federico.  I have viewed most recent oncology note and lab work.  #Castrate resistant advanced prostate cancer with metastatic spread to the lymph nodes and bone  - Next appointment with oncologist is 12/04/23 - Last Xgeva  was held 2/2 recent dental work   #Left leg swelling - Chart review shows last imaging was CT CAP was 01/22/24 and did not show any pelvic lymphadenopathy. No evidence of lymphadenopathy or soft tissue metastatic disease  - Acute on chronic. Has been improving since onset. Palpable DP pulse on exam. No open wounds or systemic symptoms to suggest cellulitis. Exam is not suggestive of ischemic limb. Pain not out of proportion. Patient has had DVT evaluation for similar symptoms in the past and was negative. Discussed with Dr. Federico who agrees with plan for urgent referral for vascular surgery to evaluate for PAD vs CVI. Patient agreeable with plan. He prefers to continue tylenol  for pain control. - Strict ED precautions discussed should symptoms worsen.   HEME/ONC HISTORY Oncology History   No history exists.      INTERVAL HISTORY  Discussed the use of AI scribe software for clinical note transcription with the patient, who gave verbal consent to proceed.    Philip [name] is a 78 y.o.  male with oncologic history as above presenting to [facility] today with chief complaint of left leg swelling. Patient presents unaccompanied to visit today.  He has been experiencing persistent swelling and pain in his left lower extremity since 10/03/23. Initially, the entire foot was swollen and painful, making ambulation difficult. Although the swelling has decreased by approximately 75%, he continues to experience pain that worsens throughout the day and subsides somewhat at night. The pain is described as burning, aching, stabbing, throbbing, and sharp, exacerbated by standing or applying pressure. It seems to originate from the shin bone and can radiate up the leg. He rates pina 6/10 in severity. Pain improves after taking Tylenol  which he only takes sparingly.  The chronic swelling in his left thigh is unchanged today. There have been no recent falls or injuries to the leg, and he denies fevers or chills. He has been evaluated for blood clots in his left leg in the past. He also uses a cream containing 4% lidocaine  for knee arthritis, which provides some relief. He wears ankle compression socks which help. He does comment that his swelling decreases overnight. He denies any fever or chills.     ROS  All other systems are reviewed and are negative for acute change except as noted in the HPI.    Allergies  Allergen Reactions   Prednisone  Anxiety     Past Medical History:  Diagnosis Date   Allergy    Anxiety    Arthritis    Atherosclerosis of aorta (HCC) 03/11/2017  The 10-year ASCVD risk score Verdon DC Mickey., et al., 2013) is: 24.6%   Values used to calculate the score:     Age: 31 years     Sex: Male     Is Non-Hispanic African American: No     Diabetic: No     Tobacco smoker: No     Systolic Blood Pressure: 154 mmHg     Is BP treated: No     HDL Cholesterol: 71.1 mg/dL     Total Cholesterol: 212 mg/dL   Cancer (HCC)    PROSTATE   Cataract    Chronic idiopathic constipation 03/11/2017    Depression    Edema leg 11/29/2014   left leg from foot to thigh, increasinly worse over last 4 weeks   Essential hypertension 03/30/2018   Gastroesophageal reflux disease without esophagitis 03/11/2017   Hyperlipidemia LDL goal <100 03/16/2017   The 10-year ASCVD risk score Verdon DC Jr., et al., 2013) is: 30.6%   Values used to calculate the score:     Age: 49 years     Sex: Male     Is Non-Hispanic African American: No     Diabetic: No     Tobacco smoker: No     Systolic Blood Pressure: 148 mmHg     Is BP treated: Yes     HDL Cholesterol: 69.3 mg/dL     Total Cholesterol: 211 mg/dL   Hypertension    Hypoglycemia    Swelling LAST 30 DAYS [facility] AWARE   LEFT LEG AND FOOT     Past Surgical History:  Procedure Laterality Date   BOWEL DECOMPRESSION N/A 03/07/2021   Procedure: BOWEL DECOMPRESSION;  Surgeon: Leigh Elspeth SQUIBB, MD;  Location: [facility];  Service: Gastroenterology;  Laterality: N/A;   BOWEL DECOMPRESSION N/A 06/07/2021   Procedure: BOWEL DECOMPRESSION;  Surgeon: Rollin Dover, MD;  Location: [facility];  Service: Gastroenterology;  Laterality: N/A;   COLON SURGERY     FLEXIBLE SIGMOIDOSCOPY N/A 03/07/2021   Procedure: FLEXIBLE SIGMOIDOSCOPY;  Surgeon: Leigh Elspeth SQUIBB, MD;  Location: [facility];  Service: Gastroenterology;  Laterality: N/A;   FLEXIBLE SIGMOIDOSCOPY N/A 06/07/2021   Procedure: FLEXIBLE SIGMOIDOSCOPY;  Surgeon: Rollin Dover, MD;  Location: [facility];  Service: Gastroenterology;  Laterality: N/A;   GUM SURGERY     TEETH IMPLANTS ALSO   LAPAROSCOPIC SIGMOID COLECTOMY N/A 06/10/2021   Procedure: LAPAROSCOPIC ASSISTED SIGMOID COLECTOMY;  Surgeon: Gladis Cough, MD;  Location: [facility];  Service: General;  Laterality: N/A;   ORCHIECTOMY Bilateral 01/03/2015   Procedure: ORCHIECTOMY;  Surgeon: Ricardo Likens, MD;  Location: [facility];  Service: Urology;  Laterality: Bilateral;    Social History   Socioeconomic History   Marital status: Married     Spouse name: Not on file   Number of children: Not on file   Years of education: Not on file   Highest education level: Bachelor's degree (e.g., BA, AB, BS)  Occupational History   Not on file  Tobacco Use   Smoking status: Never   Smokeless tobacco: Never  Vaping Use   Vaping status: Never Used  Substance and Sexual Activity   Alcohol use: Yes    Alcohol/week: 1.0 standard drink of alcohol    Types: 1 Glasses of wine per week    Comment: occasional wine with dinner   Drug use: No   Sexual activity: Not Currently    Birth control/protection: None  Other Topics Concern   Not on file  Social History Narrative  Not on file   Social Drivers of Health   Financial Resource Strain: Low Risk  (04/21/2023)   Overall Financial Resource Strain (CARDIA)    Difficulty of Paying Living Expenses: Not hard at all  Food Insecurity: No Food Insecurity (04/21/2023)   Hunger Vital Sign    Worried About Running Out of Food in the Last Year: Never true    Ran Out of Food in the Last Year: Never true  Transportation Needs: No Transportation Needs (04/21/2023)   PRAPARE - Administrator, Civil Service (Medical): No    Lack of Transportation (Non-Medical): No  Physical Activity: Insufficiently Active (04/21/2023)   Exercise Vital Sign    Days of Exercise per Week: 7 days    Minutes of Exercise per Session: 20 min  Stress: Stress Concern Present (04/21/2023)   [employer] of Occupational Health - Occupational Stress Questionnaire    Feeling of Stress : Rather much  Social Connections: Socially Isolated (04/21/2023)   Social Connection and Isolation Panel    Frequency of Communication with Friends and Family: Never    Frequency of Social Gatherings with Friends and Family: Never    Attends Religious Services: Never    Database administrator or Organizations: No    Attends Engineer, structural: Not on file    Marital Status: Married  Catering manager Violence: Not At  Risk (04/24/2023)   Humiliation, Afraid, Rape, and Kick questionnaire    Fear of Current or Ex-Partner: No    Emotionally Abused: No    Physically Abused: No    Sexually Abused: No    Family History  Problem Relation Age of Onset   CVA Mother    Lymphoma Mother    Pneumonia Mother    CAD Father    Osteoarthritis Brother    Colon cancer Neg Hx      Current Outpatient Medications:    aspirin  EC 81 MG tablet, Take 81 mg by mouth daily. Swallow whole., Disp: , Rfl:    atorvastatin  (LIPITOR) 10 MG tablet, TAKE 1 TABLET BY MOUTH EVERY DAY, Disp: 90 tablet, Rfl: 0   BIOTIN 5000 PO, Take 1 tablet by mouth daily., Disp: , Rfl:    CALCIUM -VITAMIN D PO, Take 1 tablet by mouth daily., Disp: , Rfl:    cyanocobalamin  (VITAMIN B12) 1000 MCG tablet, Take 1,000 mcg by mouth daily., Disp: , Rfl:    Denosumab  (XGEVA  [place]), Inject into the skin. Every 6-8 weeks., Disp: , Rfl:    enzalutamide  (XTANDI ) 40 MG tablet, Take 4 tablets (160mg ) by mouth once daily as directed by physician., Disp: 120 tablet, Rfl: 6   famotidine  (PEPCID ) 20 MG tablet, Take 1 tablet (20 mg total) by mouth at bedtime., Disp: , Rfl:    ferrous sulfate  325 (65 FE) MG EC tablet, Take 1 tablet (325 mg total) by mouth daily with breakfast., Disp: 90 tablet, Rfl: 1   fexofenadine (ALLEGRA) 180 MG tablet, Take 180 mg by mouth daily., Disp: , Rfl:    hydrOXYzine  (ATARAX /VISTARIL ) 10 MG tablet, Take 10 mg by mouth daily., Disp: , Rfl:    lidocaine -prilocaine  (EMLA ) cream, Apply 1 Application topically as needed (access port)., Disp: 30 g, Rfl: 0   LORazepam  (ATIVAN ) 1 MG tablet, Take 1 tablet (1 mg total) by mouth every 8 (eight) hours as needed. for anxiety, Disp: 90 tablet, Rfl: 0   Multiple Vitamin (MULTIVITAMIN) tablet, Take 1 tablet by mouth daily., Disp: , Rfl:    olmesartan  (BENICAR )  20 MG tablet, TAKE 1 TABLET BY MOUTH EVERY DAY, Disp: 90 tablet, Rfl: 1   omeprazole  (PRILOSEC) 20 MG capsule, Take 1 capsule (20 mg total) by mouth  daily., Disp: 90 capsule, Rfl: 3   potassium chloride  SA (KLOR-CON  M) 20 MEQ tablet, TAKE 1 TABLET BY MOUTH TWICE A DAY, Disp: 180 tablet, Rfl: 1  PHYSICAL EXAM ECOG FS:1 - Symptomatic but completely ambulatory    Vitals:   10/28/23 1012 10/28/23 1016  BP: (!) 156/85 (!) 145/77  Pulse: 73   Resp: 18   Temp: 97.6 F (36.4 C)   TempSrc: Temporal   SpO2: 100%   Weight: 191 lb (86.6 kg)   Height: 6' 4 (1.93 m)    Physical Exam Vitals and nursing note reviewed.  Constitutional:      Appearance: He is not ill-appearing or toxic-appearing.  HENT:     Head: Normocephalic.  Eyes:     Conjunctiva/sclera: Conjunctivae normal.  Cardiovascular:     Rate and Rhythm: Normal rate.     Pulses: Normal pulses.          Dorsalis pedis pulses are 2+ on the left side.  Pulmonary:     Effort: Pulmonary effort is normal.  Abdominal:     General: There is no distension.  Musculoskeletal:     Cervical back: Normal range of motion.     Comments: Full ROM of left lower extremity. No bony tenderness or gross deformity. No calf tenderness. Left lower extremity is edematous. Non pitting.  Please see media below.  Skin:    General: Skin is warm and dry.     Comments: Left foot is erythematous extending to shin. Not streaking pattern. No wounds. Equal tactile temperature of BLE  Neurological:     Mental Status: He is alert.        LABORATORY DATA I have reviewed the data as listed    Latest Ref Rng & Units 10/14/2023    3:07 PM 09/09/2023    2:55 PM 07/22/2023    3:36 PM  CBC  WBC 4.0 - 10.5 K/uL 5.7  6.1  6.6   Hemoglobin 13.0 - 17.0 g/dL 88.4  88.4  87.9   Hematocrit 39.0 - 52.0 % 34.4  33.9  36.0   Platelets 150 - 400 K/uL 227  226  237         Latest Ref Rng & Units 10/14/2023    3:07 PM 09/09/2023    2:55 PM 07/22/2023    3:36 PM  CMP  Glucose 70 - 99 mg/dL 895  895  95   BUN 8 - 23 mg/dL 22  22  26    Creatinine 0.61 - 1.24 mg/dL 8.95  8.97  8.79   Sodium 135 - 145 mmol/L 133   137  137   Potassium 3.5 - 5.1 mmol/L 4.3  4.4  4.8   Chloride 98 - 111 mmol/L 100  105  104   CO2 22 - 32 mmol/L 27  25  26    Calcium  8.9 - 10.3 mg/dL 9.2  9.3  9.4   Total Protein 6.5 - 8.1 g/dL 6.6  6.4  6.8   Total Bilirubin 0.0 - 1.2 mg/dL 0.4  0.4  0.4   Alkaline Phos 38 - 126 U/L 54  47  44   AST 15 - 41 U/L 14  15  16    ALT 0 - 44 U/L 10  10  11         RADIOGRAPHIC  STUDIES (from last 24 hours if applicable) I have personally reviewed the radiological images as listed and agreed with the findings in the report. No results found.      Visit Diagnosis: 1. Left leg swelling   2. Prostate cancer [facility])      Orders Placed This Encounter  Procedures   Ambulatory referral to Vascular Surgery    Referral Priority:   Urgent    Referral Type:   Surgical    Referral Reason:   Specialty Services Required    Referred to Provider:   Gretta Lonni PARAS, MD    Requested Specialty:   Vascular Surgery    Number of Visits Requested:   1    All questions were answered. The patient knows to call the clinic with any problems, questions or concerns. No barriers to learning was detected.  A total of more than 20 minutes were spent on this encounter with face-to-face time and non-face-to-face time, including preparing to see the patient, ordering tests and/or medications, counseling the patient and coordination of care as outlined above.    Thank you for allowing me to participate in the care of this patient.    Celestine Prim E  Walisiewicz, PA-C Department of Hematology/Oncology [facility] Phone: [phone]  [phone]    10/28/2023 12:13 PM

## 2023-10-30 ENCOUNTER — Other Ambulatory Visit: Payer: Self-pay | Admitting: Internal Medicine

## 2023-10-30 DIAGNOSIS — I10 Essential (primary) hypertension: Secondary | ICD-10-CM

## 2023-11-12 ENCOUNTER — Other Ambulatory Visit: Payer: Self-pay | Admitting: Internal Medicine

## 2023-11-12 DIAGNOSIS — I700 Atherosclerosis of aorta: Secondary | ICD-10-CM

## 2023-11-12 DIAGNOSIS — E785 Hyperlipidemia, unspecified: Secondary | ICD-10-CM

## 2023-11-18 ENCOUNTER — Encounter (INDEPENDENT_AMBULATORY_CARE_PROVIDER_SITE_OTHER): Payer: Self-pay

## 2023-11-18 ENCOUNTER — Other Ambulatory Visit: Payer: Self-pay

## 2023-11-18 ENCOUNTER — Other Ambulatory Visit (HOSPITAL_COMMUNITY): Payer: Self-pay

## 2023-11-18 ENCOUNTER — Other Ambulatory Visit: Payer: Self-pay | Admitting: Pharmacy Technician

## 2023-11-18 NOTE — Progress Notes (Signed)
 Specialty Pharmacy Refill Coordination Note  Philip [name] is a 78 y.o. male contacted today regarding refills of specialty medication(s) Enzalutamide  (XTANDI )   Patient requested [name] at [facility] [place] date: 11/27/23   Medication will be filled on 11/26/23. Answered questionnaire.

## 2023-11-26 ENCOUNTER — Other Ambulatory Visit: Payer: Self-pay

## 2023-12-02 ENCOUNTER — Other Ambulatory Visit: Payer: Self-pay | Admitting: *Deleted

## 2023-12-02 ENCOUNTER — Inpatient Hospital Stay: Attending: Oncology

## 2023-12-02 ENCOUNTER — Ambulatory Visit: Admitting: Internal Medicine

## 2023-12-02 DIAGNOSIS — C61 Malignant neoplasm of prostate: Secondary | ICD-10-CM | POA: Insufficient documentation

## 2023-12-02 DIAGNOSIS — K7460 Unspecified cirrhosis of liver: Secondary | ICD-10-CM | POA: Diagnosis not present

## 2023-12-02 DIAGNOSIS — C7951 Secondary malignant neoplasm of bone: Secondary | ICD-10-CM | POA: Insufficient documentation

## 2023-12-02 DIAGNOSIS — C779 Secondary and unspecified malignant neoplasm of lymph node, unspecified: Secondary | ICD-10-CM | POA: Insufficient documentation

## 2023-12-02 LAB — CBC WITH DIFFERENTIAL (CANCER CENTER ONLY)
Abs Immature Granulocytes: 0.02 K/uL (ref 0.00–0.07)
Basophils Absolute: 0 K/uL (ref 0.0–0.1)
Basophils Relative: 0 %
Eosinophils Absolute: 0.1 K/uL (ref 0.0–0.5)
Eosinophils Relative: 1 %
HCT: 34.4 % — ABNORMAL LOW (ref 39.0–52.0)
Hemoglobin: 11.5 g/dL — ABNORMAL LOW (ref 13.0–17.0)
Immature Granulocytes: 0 %
Lymphocytes Relative: 32 %
Lymphs Abs: 1.9 K/uL (ref 0.7–4.0)
MCH: 31.3 pg (ref 26.0–34.0)
MCHC: 33.4 g/dL (ref 30.0–36.0)
MCV: 93.5 fL (ref 80.0–100.0)
Monocytes Absolute: 0.6 K/uL (ref 0.1–1.0)
Monocytes Relative: 10 %
Neutro Abs: 3.4 K/uL (ref 1.7–7.7)
Neutrophils Relative %: 57 %
Platelet Count: 241 K/uL (ref 150–400)
RBC: 3.68 MIL/uL — ABNORMAL LOW (ref 4.22–5.81)
RDW: 12.7 % (ref 11.5–15.5)
WBC Count: 5.9 K/uL (ref 4.0–10.5)
nRBC: 0 % (ref 0.0–0.2)

## 2023-12-02 LAB — CMP (CANCER CENTER ONLY)
ALT: 9 U/L (ref 0–44)
AST: 13 U/L — ABNORMAL LOW (ref 15–41)
Albumin: 4 g/dL (ref 3.5–5.0)
Alkaline Phosphatase: 47 U/L (ref 38–126)
Anion gap: 6 (ref 5–15)
BUN: 21 mg/dL (ref 8–23)
CO2: 26 mmol/L (ref 22–32)
Calcium: 9 mg/dL (ref 8.9–10.3)
Chloride: 103 mmol/L (ref 98–111)
Creatinine: 1.07 mg/dL (ref 0.61–1.24)
GFR, Estimated: >60 mL/min (ref >60)
Glucose, Bld: 103 mg/dL — ABNORMAL HIGH (ref 70–99)
Potassium: 4.4 mmol/L (ref 3.5–5.1)
Sodium: 135 mmol/L (ref 135–145)
Total Bilirubin: 0.3 mg/dL (ref 0.0–1.2)
Total Protein: 6.5 g/dL (ref 6.5–8.1)

## 2023-12-02 LAB — PSA: Prostatic Specific Antigen: <0.02 ng/mL (ref 0.00–4.00)

## 2023-12-03 NOTE — Progress Notes (Signed)
 [name] [phone]   [phone]  PROGRESS NOTE  Patient Care Team: Joshua Debby CROME, MD as PCP - General (Internal Medicine) Francella Fairy SAILOR, OD as Consulting Physician (Optometry) Alvaro, Ricardo KATHEE Raddle., MD as Consulting Physician (Urology) Amadeo Windell SAILOR, MD (Inactive) as Consulting Physician (Oncology)  Hematological/Oncological History # Castrate Resistant Advanced Prostate Cancer # Metastatic Prostate Cancer to Lymph Nodes/Bone  01/2015: orchiectomy performed 02/16/2015: Taxotere  chemotherapy at 75 mg/m. Continued until 05/2015 09/2015: start of zytiga . Stopped in 2023 due to insurance issues 04/2021: Xtandi  160 mg daily  03/19/2022: last visit with Dr. Amadeo 05/20/2022: transition care to Dr. Federico   Interval History:  Philip [name] 78 y.o. 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 The patient's last visit was on 09/11/2023. In the interim since the last visit he has continued on Xtandi  therapy.   On exam today Philip [name] reports he is tolerating treatment without any new or concerning symptoms.  His energy and appetite are unchanged.  He does have some insomnia with minimal impact on his daily activities.  He denies nausea, vomiting or bowel habit changes.  He denies easy bruising or signs of active bleeding.  Patient denies fevers, chills, sweats, shortness of breath, chest pain or cough.  He has no other complaints.  Rest of 10 point ROS.  MEDICAL HISTORY:  Past Medical History:  Diagnosis Date   Allergy    Anxiety    Arthritis    Atherosclerosis of aorta 03/11/2017   The 10-year ASCVD risk score Verdon DC Jr., et al., 2013) is: 24.6%   Values used to calculate the score:     Age: 78 years     Sex: Male     Is Non-Hispanic African American: No     Diabetic: No     Tobacco smoker: No     Systolic Blood Pressure:  154 mmHg     Is BP treated: No     HDL Cholesterol: 71.1 mg/dL     Total Cholesterol: 212 mg/dL   Cancer (HCC)    PROSTATE   Cataract    Chronic idiopathic constipation 03/11/2017   Depression    Edema leg 11/29/2014   left leg from foot to thigh, increasinly worse over last 4 weeks   Essential hypertension 03/30/2018   Gastroesophageal reflux disease without esophagitis 03/11/2017   Hyperlipidemia LDL goal <100 03/16/2017   The 10-year ASCVD risk score Verdon DC Jr., et al., 2013) is: 30.6%   Values used to calculate the score:     Age: 50 years     Sex: Male     Is Non-Hispanic African American: No     Diabetic: No     Tobacco smoker: No     Systolic Blood Pressure: 148 mmHg     Is BP treated: Yes     HDL Cholesterol: 69.3 mg/dL     Total Cholesterol: 211 mg/dL   Hypertension    Hypoglycemia    Swelling LAST 30 DAYS DR [facility] AWARE   LEFT LEG AND FOOT    SURGICAL HISTORY: Past Surgical History:  Procedure Laterality Date   BOWEL DECOMPRESSION N/A 03/07/2021   Procedure: BOWEL DECOMPRESSION;  Surgeon: Leigh Elspeth SQUIBB, MD;  Location: [facility];  Service: Gastroenterology;  Laterality: N/A;   BOWEL DECOMPRESSION N/A 06/07/2021   Procedure: BOWEL DECOMPRESSION;  Surgeon: Rollin Dover, MD;  Location: [facility];  Service: Gastroenterology;  Laterality: N/A;   COLON SURGERY     FLEXIBLE SIGMOIDOSCOPY N/A 03/07/2021   Procedure: FLEXIBLE SIGMOIDOSCOPY;  Surgeon: Leigh Elspeth SQUIBB, MD;  Location: [facility];  Service: Gastroenterology;  Laterality: N/A;   FLEXIBLE SIGMOIDOSCOPY N/A 06/07/2021   Procedure: FLEXIBLE SIGMOIDOSCOPY;  Surgeon: Rollin Dover, MD;  Location: [facility];  Service: Gastroenterology;  Laterality: N/A;   GUM SURGERY     TEETH IMPLANTS ALSO   LAPAROSCOPIC SIGMOID COLECTOMY N/A 06/10/2021   Procedure: LAPAROSCOPIC ASSISTED SIGMOID COLECTOMY;  Surgeon: Gladis Cough, MD;  Location: [facility];  Service: General;  Laterality: N/A;   ORCHIECTOMY Bilateral  01/03/2015   Procedure: ORCHIECTOMY;  Surgeon: Ricardo Likens, MD;  Location: [facility];  Service: Urology;  Laterality: Bilateral;    SOCIAL HISTORY: Social History   Socioeconomic History   Marital status: Married    Spouse name: Not on file   Number of children: Not on file   Years of education: Not on file   Highest education level: Bachelor's degree (e.g., BA, AB, BS)  Occupational History   Not on file  Tobacco Use   Smoking status: Never   Smokeless tobacco: Never  Vaping Use   Vaping status: Never Used  Substance and Sexual Activity   Alcohol use: Yes    Alcohol/week: 1.0 standard drink of alcohol    Types: 1 Glasses of wine per week    Comment: occasional wine with dinner   Drug use: No   Sexual activity: Not Currently    Birth control/protection: None  Other Topics Concern   Not on file  Social History Narrative   Not on file   Social Drivers of Health   Financial Resource Strain: Low Risk  (04/21/2023)   Overall Financial Resource Strain (CARDIA)    Difficulty of Paying Living Expenses: Not hard at all  Food Insecurity: No Food Insecurity (04/21/2023)   Hunger Vital Sign    Worried About Running Out of Food in the Last Year: Never true    Ran Out of Food in the Last Year: Never true  Transportation Needs: No Transportation Needs (04/21/2023)   PRAPARE - Administrator, Civil Service (Medical): No    Lack of Transportation (Non-Medical): No  Physical Activity: Insufficiently Active (04/21/2023)   Exercise Vital Sign    Days of Exercise per Week: 7 days    Minutes of Exercise per Session: 20 min  Stress: Stress Concern Present (04/21/2023)   [employer] of Occupational Health - Occupational Stress Questionnaire    Feeling of Stress : Rather much  Social Connections: Socially Isolated (04/21/2023)   Social Connection and Isolation Panel    Frequency of Communication with Friends and Family: Never    Frequency of Social Gatherings with Friends  and Family: Never    Attends Religious Services: Never    Database administrator or Organizations: No    Attends Engineer, structural: Not on file    Marital Status: Married  Catering manager Violence: Not At Risk (04/24/2023)   Humiliation, Afraid, Rape, and Kick questionnaire    Fear of Current or Ex-Partner: No    Emotionally Abused: No    Physically Abused: No    Sexually Abused: No    FAMILY HISTORY: Family History  Problem Relation Age of Onset   CVA Mother    Lymphoma Mother    Pneumonia Mother    CAD Father    Osteoarthritis Brother    Colon  cancer Neg Hx     ALLERGIES:  is allergic to prednisone .  MEDICATIONS:  Current Outpatient Medications  Medication Sig Dispense Refill   aspirin  EC 81 MG tablet Take 81 mg by mouth daily. Swallow whole.     atorvastatin  (LIPITOR) 10 MG tablet TAKE 1 TABLET BY MOUTH EVERY DAY 90 tablet 0   BIOTIN 5000 PO Take 1 tablet by mouth daily.     CALCIUM -VITAMIN D PO Take 1 tablet by mouth daily.     cyanocobalamin  (VITAMIN B12) 1000 MCG tablet Take 1,000 mcg by mouth daily.     Denosumab  (XGEVA  [place]) Inject into the skin. Every 6-8 weeks.     enzalutamide  (XTANDI ) 40 MG tablet Take 4 tablets (160mg ) by mouth once daily as directed by physician. 120 tablet 6   famotidine  (PEPCID ) 20 MG tablet Take 1 tablet (20 mg total) by mouth at bedtime.     ferrous sulfate  325 (65 FE) MG EC tablet Take 1 tablet (325 mg total) by mouth daily with breakfast. 90 tablet 1   fexofenadine (ALLEGRA) 180 MG tablet Take 180 mg by mouth daily.     hydrOXYzine  (ATARAX /VISTARIL ) 10 MG tablet Take 10 mg by mouth daily.     lidocaine -prilocaine  (EMLA ) cream Apply 1 Application topically as needed (access port). 30 g 0   LORazepam  (ATIVAN ) 1 MG tablet Take 1 tablet (1 mg total) by mouth every 8 (eight) hours as needed. for anxiety 90 tablet 0   Multiple Vitamin (MULTIVITAMIN) tablet Take 1 tablet by mouth daily.     olmesartan  (BENICAR ) 20 MG tablet TAKE 1  TABLET BY MOUTH EVERY DAY 90 tablet 0   omeprazole  (PRILOSEC) 20 MG capsule Take 1 capsule (20 mg total) by mouth daily. 90 capsule 3   potassium chloride  SA (KLOR-CON  M) 20 MEQ tablet TAKE 1 TABLET BY MOUTH TWICE A DAY 180 tablet 1   No current facility-administered medications for this visit.    REVIEW OF SYSTEMS:   Constitutional: ( - ) fevers, ( - )  chills , ( - ) night sweats Eyes: ( - ) blurriness of vision, ( - ) double vision, ( - ) watery eyes Ears, nose, mouth, throat, and face: ( - ) mucositis, ( - ) sore throat Respiratory: ( - ) cough, ( - ) dyspnea, ( - ) wheezes Cardiovascular: ( - ) palpitation, ( - ) chest discomfort, ( - ) lower extremity swelling Gastrointestinal:  ( - ) nausea, ( - ) heartburn, ( - ) change in bowel habits Skin: ( - ) abnormal skin rashes Lymphatics: ( - ) new lymphadenopathy, ( - ) easy bruising Neurological: ( - ) numbness, ( - ) tingling, ( - ) new weaknesses Behavioral/Psych: ( - ) mood change, ( - ) new changes  All other systems were reviewed with the patient and are negative.  PHYSICAL EXAMINATION: ECOG PERFORMANCE STATUS: 0 - Asymptomatic  Vitals:   12/04/23 1455 12/04/23 1502  BP: (!) 146/86 (!) 146/86  Pulse: 73 73  Resp: 14 14  Temp: 97.7 F (36.5 C) 97.7 F (36.5 C)  SpO2: 100% 100%   Filed Weights   12/04/23 1455 12/04/23 1502  Weight: 190 lb 11.2 oz (86.5 kg) 190 lb 11.2 oz (86.5 kg)    GENERAL: Well-appearing elderly Caucasian male, alert, no distress and comfortable SKIN: skin color, texture, turgor are normal, no rashes or significant lesions EYES: conjunctiva are pink and non-injected, sclera clear LUNGS: clear to auscultation and percussion with normal breathing  effort HEART: regular rate & rhythm and no murmurs and no lower extremity edema Musculoskeletal: no cyanosis of digits and no clubbing  PSYCH: alert & oriented x 3, fluent speech NEURO: no focal motor/sensory deficits  LABORATORY DATA:  I have reviewed  the data as listed    Latest Ref Rng & Units 12/02/2023    3:15 PM 10/14/2023    3:07 PM 09/09/2023    2:55 PM  CBC  WBC 4.0 - 10.5 K/uL 5.9  5.7  6.1   Hemoglobin 13.0 - 17.0 g/dL 88.4  88.4  88.4   Hematocrit 39.0 - 52.0 % 34.4  34.4  33.9   Platelets 150 - 400 K/uL 241  227  226        Latest Ref Rng & Units 12/02/2023    3:15 PM 10/14/2023    3:07 PM 09/09/2023    2:55 PM  CMP  Glucose 70 - 99 mg/dL 896  895  895   BUN 8 - 23 mg/dL 21  22  22    Creatinine 0.61 - 1.24 mg/dL 8.92  8.95  8.97   Sodium 135 - 145 mmol/L 135  133  137   Potassium 3.5 - 5.1 mmol/L 4.4  4.3  4.4   Chloride 98 - 111 mmol/L 103  100  105   CO2 22 - 32 mmol/L 26  27  25    Calcium  8.9 - 10.3 mg/dL 9.0  9.2  9.3   Total Protein 6.5 - 8.1 g/dL 6.5  6.6  6.4   Total Bilirubin 0.0 - 1.2 mg/dL 0.3  0.4  0.4   Alkaline Phos 38 - 126 U/L 47  54  47   AST 15 - 41 U/L 13  14  15    ALT 0 - 44 U/L 9  10  10      RADIOGRAPHIC STUDIES: DG Tibia/Fibula Left Result Date: 12/07/2023 CLINICAL DATA:  pain and swelling EXAM: LEFT TIBIA AND FIBULA - 2 VIEW COMPARISON:  01/18/2021, 12/14/2019 FINDINGS: No acute fracture or dislocation. Os trigonum. Moderate tricompartmental osteoarthritis of the knee, most severe in the patellofemoral compartment. Mild osteoarthritis of the ankle. Soft tissues are unremarkable. IMPRESSION: 1. No acute fracture or dislocation. 2. Osteoarthritis of the knee and ankle, as above. Electronically Signed   By: Rogelia Myers M.D.   On: 12/07/2023 10:53    ASSESSMENT & PLAN Philip [name] is a 78 y.o. 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  # Castrate Resistant Advanced Prostate Cancer # Metastatic Prostate Cancer to Lymph Nodes/Bone  --patient is s/p orchiectomy, no ADT therapy required --continue Xtandi  160 mg PO daily -- last CT chest abdomen pelvis showed no  evidence of disease progression on 01/22/2023.  --Labs from 12/10/2023 showed WBC 5.9, hemoglobin 11.5, platelets 241, creatinine and LFTs are in range, PSA less than 0.02. --Continue with monthly Xgeva  shots with labs  --RTC in 3 months for toxicity check.   #Cirrhotic Morphology of Liver on CT scan -- Found incidentally, evaluated by patient's gastroenterologist Dr. Leigh  No orders of the defined types were placed in this encounter.   All questions were answered. The patient knows to call the clinic with any problems, questions or concerns.  I have spent a total of 30 minutes minutes of face-to-face and non-face-to-face time, preparing to see the patient,  performing a medically appropriate examination, counseling and educating the  patient, documenting clinical information in the electronic health record, independently interpreting results and communicating results to the patient, and care coordination.   Johnston Police PA-C Dept of Hematology and Oncology [facility] Phone: [phone]    12/07/2023 3:35 PM

## 2023-12-04 ENCOUNTER — Inpatient Hospital Stay

## 2023-12-04 ENCOUNTER — Ambulatory Visit

## 2023-12-04 ENCOUNTER — Inpatient Hospital Stay: Admitting: Hematology and Oncology

## 2023-12-04 ENCOUNTER — Inpatient Hospital Stay (HOSPITAL_BASED_OUTPATIENT_CLINIC_OR_DEPARTMENT_OTHER): Admitting: Physician Assistant

## 2023-12-04 VITALS — BP 146/86 | HR 73 | Temp 97.7°F | Resp 14 | Wt 190.7 lb

## 2023-12-04 DIAGNOSIS — Z95828 Presence of other vascular implants and grafts: Secondary | ICD-10-CM

## 2023-12-04 DIAGNOSIS — C61 Malignant neoplasm of prostate: Secondary | ICD-10-CM

## 2023-12-04 DIAGNOSIS — K7460 Unspecified cirrhosis of liver: Secondary | ICD-10-CM | POA: Diagnosis not present

## 2023-12-04 DIAGNOSIS — C779 Secondary and unspecified malignant neoplasm of lymph node, unspecified: Secondary | ICD-10-CM | POA: Diagnosis not present

## 2023-12-04 DIAGNOSIS — C7951 Secondary malignant neoplasm of bone: Secondary | ICD-10-CM | POA: Diagnosis not present

## 2023-12-04 MED ORDER — DENOSUMAB 120 MG/1.7ML ~~LOC~~ SOLN
120.0000 mg | Freq: Once | SUBCUTANEOUS | Status: AC
  Administered 2023-12-04: 120 mg via SUBCUTANEOUS
  Filled 2023-12-04: qty 1.7

## 2023-12-07 ENCOUNTER — Ambulatory Visit (INDEPENDENT_AMBULATORY_CARE_PROVIDER_SITE_OTHER): Admitting: Internal Medicine

## 2023-12-07 ENCOUNTER — Encounter: Payer: Self-pay | Admitting: Internal Medicine

## 2023-12-07 ENCOUNTER — Encounter: Payer: Self-pay | Admitting: Hematology and Oncology

## 2023-12-07 ENCOUNTER — Ambulatory Visit (INDEPENDENT_AMBULATORY_CARE_PROVIDER_SITE_OTHER)

## 2023-12-07 ENCOUNTER — Ambulatory Visit: Payer: Self-pay | Admitting: Internal Medicine

## 2023-12-07 VITALS — BP 142/74 | HR 71 | Temp 98.3°F | Resp 16 | Ht 76.0 in | Wt 188.0 lb

## 2023-12-07 DIAGNOSIS — D509 Iron deficiency anemia, unspecified: Secondary | ICD-10-CM

## 2023-12-07 DIAGNOSIS — I10 Essential (primary) hypertension: Secondary | ICD-10-CM

## 2023-12-07 DIAGNOSIS — Z23 Encounter for immunization: Secondary | ICD-10-CM

## 2023-12-07 DIAGNOSIS — C61 Malignant neoplasm of prostate: Secondary | ICD-10-CM | POA: Diagnosis not present

## 2023-12-07 DIAGNOSIS — M19072 Primary osteoarthritis, left ankle and foot: Secondary | ICD-10-CM | POA: Diagnosis not present

## 2023-12-07 DIAGNOSIS — M7989 Other specified soft tissue disorders: Secondary | ICD-10-CM

## 2023-12-07 DIAGNOSIS — C419 Malignant neoplasm of bone and articular cartilage, unspecified: Secondary | ICD-10-CM

## 2023-12-07 DIAGNOSIS — M79662 Pain in left lower leg: Secondary | ICD-10-CM

## 2023-12-07 DIAGNOSIS — E785 Hyperlipidemia, unspecified: Secondary | ICD-10-CM

## 2023-12-07 DIAGNOSIS — M1712 Unilateral primary osteoarthritis, left knee: Secondary | ICD-10-CM | POA: Diagnosis not present

## 2023-12-07 LAB — LIPID PANEL
Cholesterol: 210 mg/dL — ABNORMAL HIGH (ref 0–200)
HDL: 80.4 mg/dL (ref >39.00)
LDL Cholesterol: 78 mg/dL (ref 0–99)
NonHDL: 129.85
Total CHOL/HDL Ratio: 3
Triglycerides: 260 mg/dL — ABNORMAL HIGH (ref 0.0–149.0)
VLDL: 52 mg/dL — ABNORMAL HIGH (ref 0.0–40.0)

## 2023-12-07 LAB — IBC + FERRITIN
Ferritin: 151.9 ng/mL (ref 22.0–322.0)
Iron: 96 ug/dL (ref 42–165)
Saturation Ratios: 28.7 % (ref 20.0–50.0)
TIBC: 334.6 ug/dL (ref 250.0–450.0)
Transferrin: 239 mg/dL (ref 212.0–360.0)

## 2023-12-07 LAB — D-DIMER, QUANTITATIVE: D-Dimer, Quant: 0.61 ug{FEU}/mL — ABNORMAL HIGH (ref <0.50)

## 2023-12-07 NOTE — Patient Instructions (Signed)
 Hypertension, Adult High blood pressure (hypertension) is when the force of blood pumping through the arteries is too strong. The arteries are the blood vessels that carry blood from the heart throughout the body. Hypertension forces the heart to work harder to pump blood and may cause arteries to become narrow or stiff. Untreated or uncontrolled hypertension can lead to a heart attack, heart failure, a stroke, kidney disease, and other problems. A blood pressure reading consists of a higher number over a lower number. Ideally, your blood pressure should be below 120/80. The first ("top") number is called the systolic pressure. It is a measure of the pressure in your arteries as your heart beats. The second ("bottom") number is called the diastolic pressure. It is a measure of the pressure in your arteries as the heart relaxes. What are the causes? The exact cause of this condition is not known. There are some conditions that result in high blood pressure. What increases the risk? Certain factors may make you more likely to develop high blood pressure. Some of these risk factors are under your control, including: Smoking. Not getting enough exercise or physical activity. Being overweight. Having too much fat, sugar, calories, or salt (sodium) in your diet. Drinking too much alcohol. Other risk factors include: Having a personal history of heart disease, diabetes, high cholesterol, or kidney disease. Stress. Having a family history of high blood pressure and high cholesterol. Having obstructive sleep apnea. Age. The risk increases with age. What are the signs or symptoms? High blood pressure may not cause symptoms. Very high blood pressure (hypertensive crisis) may cause: Headache. Fast or irregular heartbeats (palpitations). Shortness of breath. Nosebleed. Nausea and vomiting. Vision changes. Severe chest pain, dizziness, and seizures. How is this diagnosed? This condition is diagnosed by  measuring your blood pressure while you are seated, with your arm resting on a flat surface, your legs uncrossed, and your feet flat on the floor. The cuff of the blood pressure monitor will be placed directly against the skin of your upper arm at the level of your heart. Blood pressure should be measured at least twice using the same arm. Certain conditions can cause a difference in blood pressure between your right and left arms. If you have a high blood pressure reading during one visit or you have normal blood pressure with other risk factors, you may be asked to: Return on a different day to have your blood pressure checked again. Monitor your blood pressure at home for 1 week or longer. If you are diagnosed with hypertension, you may have other blood or imaging tests to help your health care provider understand your overall risk for other conditions. How is this treated? This condition is treated by making healthy lifestyle changes, such as eating healthy foods, exercising more, and reducing your alcohol intake. You may be referred for counseling on a healthy diet and physical activity. Your health care provider may prescribe medicine if lifestyle changes are not enough to get your blood pressure under control and if: Your systolic blood pressure is above 130. Your diastolic blood pressure is above 80. Your personal target blood pressure may vary depending on your medical conditions, your age, and other factors. Follow these instructions at home: Eating and drinking  Eat a diet that is high in fiber and potassium, and low in sodium, added sugar, and fat. An example of this eating plan is called the DASH diet. DASH stands for Dietary Approaches to Stop Hypertension. To eat this way: Eat  plenty of fresh fruits and vegetables. Try to fill one half of your plate at each meal with fruits and vegetables. Eat whole grains, such as whole-wheat pasta, brown rice, or whole-grain bread. Fill about one  fourth of your plate with whole grains. Eat or drink low-fat dairy products, such as skim milk or low-fat yogurt. Avoid fatty cuts of meat, processed or cured meats, and poultry with skin. Fill about one fourth of your plate with lean proteins, such as fish, chicken without skin, beans, eggs, or tofu. Avoid pre-made and processed foods. These tend to be higher in sodium, added sugar, and fat. Reduce your daily sodium intake. Many people with hypertension should eat less than 1,500 mg of sodium a day. Do not drink alcohol if: Your health care provider tells you not to drink. You are pregnant, may be pregnant, or are planning to become pregnant. If you drink alcohol: Limit how much you have to: 0-1 drink a day for women. 0-2 drinks a day for men. Know how much alcohol is in your drink. In the [place] one drink equals one 12 oz bottle of beer (355 mL), one 5 oz glass of wine (148 mL), or one 1 oz glass of hard liquor (44 mL). Lifestyle  Work with your health care provider to maintain a healthy body weight or to lose weight. Ask what an ideal weight is for you. Get at least 30 minutes of exercise that causes your heart to beat faster (aerobic exercise) most days of the week. Activities may include walking, swimming, or biking. Include exercise to strengthen your muscles (resistance exercise), such as Pilates or lifting weights, as part of your weekly exercise routine. Try to do these types of exercises for 30 minutes at least 3 days a week. Do not use any products that contain nicotine or tobacco. These products include cigarettes, chewing tobacco, and vaping devices, such as e-cigarettes. If you need help quitting, ask your health care provider. Monitor your blood pressure at home as told by your health care provider. Keep all follow-up visits. This is important. Medicines Take over-the-counter and prescription medicines only as told by your health care provider. Follow directions carefully. Blood  pressure medicines must be taken as prescribed. Do not skip doses of blood pressure medicine. Doing this puts you at risk for problems and can make the medicine less effective. Ask your health care provider about side effects or reactions to medicines that you should watch for. Contact a health care provider if you: Think you are having a reaction to a medicine you are taking. Have headaches that keep coming back (recurring). Feel dizzy. Have swelling in your ankles. Have trouble with your vision. Get help right away if you: Develop a severe headache or confusion. Have unusual weakness or numbness. Feel faint. Have severe pain in your chest or abdomen. Vomit repeatedly. Have trouble breathing. These symptoms may be an emergency. Get help right away. Call 911. Do not wait to see if the symptoms will go away. Do not drive yourself to the hospital. Summary Hypertension is when the force of blood pumping through your arteries is too strong. If this condition is not controlled, it may put you at risk for serious complications. Your personal target blood pressure may vary depending on your medical conditions, your age, and other factors. For most people, a normal blood pressure is less than 120/80. Hypertension is treated with lifestyle changes, medicines, or a combination of both. Lifestyle changes include losing weight, eating a healthy,  low-sodium diet, exercising more, and limiting alcohol. This information is not intended to replace advice given to you by your health care provider. Make sure you discuss any questions you have with your health care provider. Document Revised: 12/25/2020 Document Reviewed: 12/25/2020 Elsevier Patient Education  2024 [employer].

## 2023-12-07 NOTE — Progress Notes (Signed)
 Subjective:  Patient ID: Philip [name], male    DOB: 1945-04-03  Age: 78 y.o. MRN: [id number]  CC: Hypertension, Anemia, and Hyperlipidemia   HPI Philip [name] presents for f/up ---  Discussed the use of AI scribe software for clinical note transcription with the patient, who gave verbal consent to proceed.  History of Present Illness Philip [name] is a 78 year old male with prostate cancer who presents with left calf swelling and pain.  He has been experiencing swelling and pain in his left calf since early August. The swelling was significant and persisted for nearly four weeks before subsiding. Although the swelling has not recurred in years, the calf still appears slightly swollen. He associates the swelling with a severe ankle sprain that occurred two years ago. His prostate cancer, diagnosed in 2016, initially presented in the same leg, prompting evaluation by oncology. He did not find any concerning findings but suggested a possible vascular issue, leading to a referral to vascular specialists.  The pain originates from his heel and travels up his shin bone, particularly when standing for extended periods. He denies any current pain in the calf while walking. The shin bone has not been x-rayed, although his ankle was previously imaged.  In addition to the calf issue, he has chronic left knee pain, described as 'bone on bone'. Surgery has not been pursued due to concerns about potential blood clots and his ongoing cancer.  No chest pain, shortness of breath, or stomach trouble. His weight and appetite have been stable.     Outpatient Medications Prior to Visit  Medication Sig Dispense Refill   aspirin  EC 81 MG tablet Take 81 mg by mouth daily. Swallow whole.     atorvastatin  (LIPITOR) 10 MG tablet TAKE 1 TABLET BY MOUTH EVERY DAY 90 tablet 0   BIOTIN 5000 PO Take 1 tablet by mouth daily.     CALCIUM -VITAMIN D PO Take 1 tablet by mouth daily.     cyanocobalamin  (VITAMIN B12) 1000 MCG  tablet Take 1,000 mcg by mouth daily.     Denosumab  (XGEVA  [place]) Inject into the skin. Every 6-8 weeks.     enzalutamide  (XTANDI ) 40 MG tablet Take 4 tablets (160mg ) by mouth once daily as directed by physician. 120 tablet 6   famotidine  (PEPCID ) 20 MG tablet Take 1 tablet (20 mg total) by mouth at bedtime.     ferrous sulfate  325 (65 FE) MG EC tablet Take 1 tablet (325 mg total) by mouth daily with breakfast. 90 tablet 1   fexofenadine (ALLEGRA) 180 MG tablet Take 180 mg by mouth daily.     hydrOXYzine  (ATARAX /VISTARIL ) 10 MG tablet Take 10 mg by mouth daily.     lidocaine -prilocaine  (EMLA ) cream Apply 1 Application topically as needed (access port). 30 g 0   LORazepam  (ATIVAN ) 1 MG tablet Take 1 tablet (1 mg total) by mouth every 8 (eight) hours as needed. for anxiety 90 tablet 0   Multiple Vitamin (MULTIVITAMIN) tablet Take 1 tablet by mouth daily.     olmesartan  (BENICAR ) 20 MG tablet TAKE 1 TABLET BY MOUTH EVERY DAY 90 tablet 0   omeprazole  (PRILOSEC) 20 MG capsule Take 1 capsule (20 mg total) by mouth daily. 90 capsule 3   potassium chloride  SA (KLOR-CON  M) 20 MEQ tablet TAKE 1 TABLET BY MOUTH TWICE A DAY 180 tablet 1   No facility-administered medications prior to visit.    ROS Review of Systems  Constitutional:  Negative for appetite change, chills, diaphoresis,  fatigue and fever.  HENT: Negative.    Eyes: Negative.   Respiratory: Negative.  Negative for cough, chest tightness, shortness of breath and wheezing.   Cardiovascular:  Negative for chest pain, palpitations and leg swelling.  Gastrointestinal: Negative.  Negative for abdominal pain, constipation, diarrhea, nausea and vomiting.  Endocrine: Negative.   Genitourinary: Negative.  Negative for difficulty urinating.  Musculoskeletal:  Positive for arthralgias, gait problem and joint swelling. Negative for myalgias and neck stiffness.  Skin: Negative.   Neurological:  Negative for dizziness, weakness and light-headedness.   Hematological:  Negative for adenopathy. Does not bruise/bleed easily.  Psychiatric/Behavioral: Negative.      Objective:  BP (!) 142/74 (BP Location: Left Arm, Patient Position: Sitting, Cuff Size: Normal)   Pulse 71   Temp 98.3 F (36.8 C) (Oral)   Resp 16   Ht 6' 4 (1.93 m)   Wt 188 lb (85.3 kg)   SpO2 99%   BMI 22.88 kg/m   BP Readings from Last 3 Encounters:  12/07/23 (!) 142/74  12/04/23 (!) 146/86  10/28/23 (!) 145/77    Wt Readings from Last 3 Encounters:  12/07/23 188 lb (85.3 kg)  12/04/23 190 lb 11.2 oz (86.5 kg)  10/28/23 191 lb (86.6 kg)    Physical Exam Vitals reviewed.  Constitutional:      Appearance: Normal appearance.  HENT:     Nose: Nose normal.     Mouth/Throat:     Mouth: Mucous membranes are moist.  Eyes:     General: No scleral icterus.    Conjunctiva/sclera: Conjunctivae normal.  Cardiovascular:     Pulses:          Dorsalis pedis pulses are 1+ on the right side and 1+ on the left side.       Posterior tibial pulses are 1+ on the right side and 1+ on the left side.     Heart sounds: Normal heart sounds. No murmur heard.    No friction rub. No gallop.     Comments: EKG---- NSR, 70 bpm No LVH, Q waves, or ST/T wave changes  Pulmonary:     Effort: Pulmonary effort is normal.     Breath sounds: No stridor. No wheezing, rhonchi or rales.  Abdominal:     General: Abdomen is flat.     Palpations: There is no mass.     Tenderness: There is no abdominal tenderness. There is no guarding.     Hernia: No hernia is present.  Musculoskeletal:        General: Swelling, tenderness and deformity present. No signs of injury.     Cervical back: Neck supple.     Right lower leg: No edema.     Left lower leg: Pitting Edema (trace pitting) present.  Feet:     Right foot:     Skin integrity: Skin integrity normal.     Left foot:     Skin integrity: Skin integrity normal.  Lymphadenopathy:     Cervical: No cervical adenopathy.  Skin:     General: Skin is warm and dry.  Neurological:     General: No focal deficit present.     Mental Status: He is alert.  Psychiatric:        Mood and Affect: Mood normal.        Behavior: Behavior normal.     Lab Results  Component Value Date   WBC 5.9 12/02/2023   HGB 11.5 (L) 12/02/2023   HCT 34.4 (L) 12/02/2023  PLT 241 12/02/2023   GLUCOSE 103 (H) 12/02/2023   CHOL 210 (H) 12/07/2023   TRIG 260.0 (H) 12/07/2023   HDL 80.40 12/07/2023   LDLDIRECT 114.0 09/30/2022   LDLCALC 78 12/07/2023   ALT 9 12/02/2023   AST 13 (L) 12/02/2023   NA 135 12/02/2023   K 4.4 12/02/2023   CL 103 12/02/2023   CREATININE 1.07 12/02/2023   BUN 21 12/02/2023   CO2 26 12/02/2023   TSH 1.72 06/02/2023   PSA 0.01 (L) 09/30/2022   INR 1.0 03/26/2023    VAS US  LOWER EXTREMITY VENOUS (DVT) Result Date: 05/04/2023  Lower Venous DVT Study Patient Name:  Philip [name]  Date of Exam:   05/04/2023 Medical Rec #: [id number]    Accession #:    [id number] Date of Birth: 1945/06/22     Patient Gender: M Patient Age:   12 years Exam Location:  [facility] Procedure:      VAS US  LOWER EXTREMITY VENOUS (DVT) Referring Phys: KAITLYN WALISIEWICZ --------------------------------------------------------------------------------  Indications: Pain.  Risk Factors: Cancer. Comparison Study: No prior studies. Performing Technologist: Cordella Collet RVT  Examination Guidelines: A complete evaluation includes B-mode imaging, spectral Doppler, color Doppler, and power Doppler as needed of all accessible portions of each vessel. Bilateral testing is considered an integral part of a complete examination. Limited examinations for reoccurring indications may be performed as noted. The reflux portion of the exam is performed with the patient in reverse Trendelenburg.  +-----+---------------+---------+-----------+----------+--------------+ RIGHTCompressibilityPhasicitySpontaneityPropertiesThrombus Aging  +-----+---------------+---------+-----------+----------+--------------+ CFV  Full           Yes      Yes                                 +-----+---------------+---------+-----------+----------+--------------+   +---------+---------------+---------+-----------+----------+--------------+ LEFT     CompressibilityPhasicitySpontaneityPropertiesThrombus Aging +---------+---------------+---------+-----------+----------+--------------+ CFV      Full           Yes      Yes                                 +---------+---------------+---------+-----------+----------+--------------+ SFJ      Full                                                        +---------+---------------+---------+-----------+----------+--------------+ FV Prox  Full                                                        +---------+---------------+---------+-----------+----------+--------------+ FV Mid   Full                                                        +---------+---------------+---------+-----------+----------+--------------+ FV DistalFull                                                        +---------+---------------+---------+-----------+----------+--------------+  PFV      Full                                                        +---------+---------------+---------+-----------+----------+--------------+ POP      Full           Yes      Yes                                 +---------+---------------+---------+-----------+----------+--------------+ PTV      Full                                                        +---------+---------------+---------+-----------+----------+--------------+ PERO     Full                                                        +---------+---------------+---------+-----------+----------+--------------+    Summary: RIGHT: - No evidence of common femoral vein obstruction.   LEFT: - There is no evidence of deep vein thrombosis in the  lower extremity.  - No cystic structure found in the popliteal fossa.  *See table(s) above for measurements and observations. Electronically signed by Fonda Rim on 05/04/2023 at 7:01:33 PM.    Final     DG Tibia/Fibula Left Result Date: 12/07/2023 CLINICAL DATA:  pain and swelling EXAM: LEFT TIBIA AND FIBULA - 2 VIEW COMPARISON:  01/18/2021, 12/14/2019 FINDINGS: No acute fracture or dislocation. Os trigonum. Moderate tricompartmental osteoarthritis of the knee, most severe in the patellofemoral compartment. Mild osteoarthritis of the ankle. Soft tissues are unremarkable. IMPRESSION: 1. No acute fracture or dislocation. 2. Osteoarthritis of the knee and ankle, as above. Electronically Signed   By: Rogelia Myers M.D.   On: 12/07/2023 10:53     Assessment & Plan:  Need for immunization against influenza -     Flu vaccine HIGH DOSE PF(Fluzone  Trivalent)  Malignant neoplasm of bone, unspecified location [facility])  Prostate cancer (HCC)- Doing well on chemotherapy.  Essential hypertension- BP is adequately well controlled. -     EKG 12-Lead  Pain and swelling of left lower leg -     DG Tibia/Fibula Left; Future -     D-dimer, quantitative; Future  Hyperlipidemia LDL goal <100- LDL goal achieved. Doing well on the statin  -     Lipid panel; Future  Iron deficiency anemia, unspecified iron deficiency anemia type -     IBC + Ferritin; Future     Follow-up: Return in about 6 months (around 06/06/2024).  Debby Molt, MD

## 2023-12-11 ENCOUNTER — Encounter: Payer: Self-pay | Admitting: Internal Medicine

## 2023-12-12 ENCOUNTER — Other Ambulatory Visit: Payer: Self-pay | Admitting: Internal Medicine

## 2023-12-12 DIAGNOSIS — M170 Bilateral primary osteoarthritis of knee: Secondary | ICD-10-CM | POA: Insufficient documentation

## 2023-12-15 ENCOUNTER — Other Ambulatory Visit: Payer: Self-pay | Admitting: Hematology and Oncology

## 2023-12-15 DIAGNOSIS — C61 Malignant neoplasm of prostate: Secondary | ICD-10-CM

## 2023-12-17 ENCOUNTER — Other Ambulatory Visit: Payer: Self-pay

## 2023-12-17 ENCOUNTER — Encounter (INDEPENDENT_AMBULATORY_CARE_PROVIDER_SITE_OTHER): Payer: Self-pay

## 2023-12-17 NOTE — Progress Notes (Signed)
 Specialty Pharmacy Refill Coordination Note  Philip [name] is a 78 y.o. male contacted today regarding refills of specialty medication(s) Enzalutamide  (XTANDI )   Patient requested (Patient-Rptd) Pickup at [facility] at [facility] date: (Patient-Rptd) 12/25/23   Medication will be filled on 12/24/23.

## 2023-12-21 DIAGNOSIS — L738 Other specified follicular disorders: Secondary | ICD-10-CM | POA: Diagnosis not present

## 2023-12-21 DIAGNOSIS — L2989 Other pruritus: Secondary | ICD-10-CM | POA: Diagnosis not present

## 2023-12-21 DIAGNOSIS — L821 Other seborrheic keratosis: Secondary | ICD-10-CM | POA: Diagnosis not present

## 2023-12-21 DIAGNOSIS — D1801 Hemangioma of skin and subcutaneous tissue: Secondary | ICD-10-CM | POA: Diagnosis not present

## 2023-12-23 ENCOUNTER — Other Ambulatory Visit: Payer: Self-pay

## 2023-12-24 ENCOUNTER — Other Ambulatory Visit: Payer: Self-pay

## 2023-12-24 NOTE — Progress Notes (Signed)
 Specialty Pharmacy Ongoing Clinical Assessment Note  Philip [name] is a 78 y.o. male who is being followed by the specialty pharmacy service for RxSp Oncology   Patient's specialty medication(s) reviewed today: Enzalutamide  (XTANDI )   Missed doses in the last 4 weeks: 0   Patient/Caregiver did not have any additional questions or concerns.   Therapeutic benefit summary: Patient is achieving benefit   Adverse events/side effects summary: Experienced adverse events/side effects   Patient's therapy is appropriate to: Continue    Goals Addressed             This Visit's Progress    Maintain optimal adherence to therapy   On track    Patient is on track. Patient will maintain adherence         Follow up: 6 months  Haji Delaine M Gesenia Bantz Specialty Pharmacist

## 2023-12-31 ENCOUNTER — Inpatient Hospital Stay

## 2023-12-31 DIAGNOSIS — C61 Malignant neoplasm of prostate: Secondary | ICD-10-CM

## 2023-12-31 DIAGNOSIS — C7951 Secondary malignant neoplasm of bone: Secondary | ICD-10-CM | POA: Diagnosis not present

## 2023-12-31 DIAGNOSIS — C779 Secondary and unspecified malignant neoplasm of lymph node, unspecified: Secondary | ICD-10-CM | POA: Diagnosis not present

## 2023-12-31 DIAGNOSIS — K7460 Unspecified cirrhosis of liver: Secondary | ICD-10-CM | POA: Diagnosis not present

## 2023-12-31 LAB — CBC WITH DIFFERENTIAL (CANCER CENTER ONLY)
Abs Immature Granulocytes: 0.01 K/uL (ref 0.00–0.07)
Basophils Absolute: 0 K/uL (ref 0.0–0.1)
Basophils Relative: 0 %
Eosinophils Absolute: 0.1 K/uL (ref 0.0–0.5)
Eosinophils Relative: 1 %
HCT: 35.5 % — ABNORMAL LOW (ref 39.0–52.0)
Hemoglobin: 12.2 g/dL — ABNORMAL LOW (ref 13.0–17.0)
Immature Granulocytes: 0 %
Lymphocytes Relative: 35 %
Lymphs Abs: 2.1 K/uL (ref 0.7–4.0)
MCH: 31.9 pg (ref 26.0–34.0)
MCHC: 34.4 g/dL (ref 30.0–36.0)
MCV: 92.9 fL (ref 80.0–100.0)
Monocytes Absolute: 0.6 K/uL (ref 0.1–1.0)
Monocytes Relative: 10 %
Neutro Abs: 3.3 K/uL (ref 1.7–7.7)
Neutrophils Relative %: 54 %
Platelet Count: 216 K/uL (ref 150–400)
RBC: 3.82 MIL/uL — ABNORMAL LOW (ref 4.22–5.81)
RDW: 13.1 % (ref 11.5–15.5)
WBC Count: 6.1 K/uL (ref 4.0–10.5)
nRBC: 0 % (ref 0.0–0.2)

## 2024-01-01 ENCOUNTER — Other Ambulatory Visit: Payer: Self-pay | Admitting: *Deleted

## 2024-01-01 ENCOUNTER — Telehealth: Payer: Self-pay | Admitting: *Deleted

## 2024-01-01 ENCOUNTER — Inpatient Hospital Stay

## 2024-01-01 VITALS — BP 122/77 | HR 82 | Temp 97.8°F | Resp 17

## 2024-01-01 DIAGNOSIS — C61 Malignant neoplasm of prostate: Secondary | ICD-10-CM

## 2024-01-01 DIAGNOSIS — H524 Presbyopia: Secondary | ICD-10-CM | POA: Diagnosis not present

## 2024-01-01 DIAGNOSIS — H2513 Age-related nuclear cataract, bilateral: Secondary | ICD-10-CM | POA: Diagnosis not present

## 2024-01-01 DIAGNOSIS — H25013 Cortical age-related cataract, bilateral: Secondary | ICD-10-CM | POA: Diagnosis not present

## 2024-01-01 DIAGNOSIS — Z95828 Presence of other vascular implants and grafts: Secondary | ICD-10-CM

## 2024-01-01 MED ORDER — DENOSUMAB 120 MG/1.7ML ~~LOC~~ SOLN: 120.0000 mg | Freq: Once | SUBCUTANEOUS | Status: DC

## 2024-01-01 NOTE — Progress Notes (Signed)
 Patient here for Xgeva  injection.  Last CMP was done 12/02/23. Last CBC was drawn on yesterday, but not CMP due to it being no order.  Patient didn't want to wait to have labs drawn today.  States he would cancel injection for today and wait until he comes back on 02/04/24,  Wants to make sure all the correct orders are put in before his next appointment.  Secure chatted Dr. Federico, Beth/RN and Melanie/charge nurse.

## 2024-01-01 NOTE — Telephone Encounter (Signed)
 Met with pt in the lobby. He did not have CMP done prior to scheduled Xgeva  injection as there were no orders for CMP. Pt chose not to have additional labs today and will get his next Xgeva  injection done @ his next appt. Updated labs orders to reflect need for CMP and PSA. Pt agreeable and was not upset with the day.

## 2024-01-15 ENCOUNTER — Ambulatory Visit (HOSPITAL_COMMUNITY)
Admission: RE | Admit: 2024-01-15 | Discharge: 2024-01-15 | Disposition: A | Discharge status: Home / Self Care | Source: Ambulatory Visit | Attending: Hematology and Oncology | Admitting: Hematology and Oncology

## 2024-01-15 ENCOUNTER — Other Ambulatory Visit: Payer: Self-pay

## 2024-01-15 ENCOUNTER — Encounter: Payer: Self-pay | Admitting: Gastroenterology

## 2024-01-15 ENCOUNTER — Encounter (INDEPENDENT_AMBULATORY_CARE_PROVIDER_SITE_OTHER): Payer: Self-pay

## 2024-01-15 ENCOUNTER — Other Ambulatory Visit (HOSPITAL_COMMUNITY): Payer: Self-pay

## 2024-01-15 DIAGNOSIS — N281 Cyst of kidney, acquired: Secondary | ICD-10-CM | POA: Diagnosis not present

## 2024-01-15 DIAGNOSIS — R918 Other nonspecific abnormal finding of lung field: Secondary | ICD-10-CM | POA: Diagnosis not present

## 2024-01-15 DIAGNOSIS — C61 Malignant neoplasm of prostate: Secondary | ICD-10-CM | POA: Diagnosis not present

## 2024-01-15 DIAGNOSIS — C7951 Secondary malignant neoplasm of bone: Secondary | ICD-10-CM | POA: Diagnosis not present

## 2024-01-15 DIAGNOSIS — R945 Abnormal results of liver function studies: Secondary | ICD-10-CM

## 2024-01-15 MED ORDER — SODIUM CHLORIDE (PF) 0.9 % IJ SOLN
INTRAMUSCULAR | Status: AC
  Filled 2024-01-15: qty 50

## 2024-01-15 MED ORDER — IOHEXOL 300 MG/ML  SOLN
100.0000 mL | Freq: Once | INTRAMUSCULAR | Status: AC | PRN
  Administered 2024-01-15: 100 mL via INTRAVENOUS

## 2024-01-15 MED ORDER — HEPARIN SOD (PORK) LOCK FLUSH 100 UNIT/ML IV SOLN: 500.0000 [IU] | Freq: Once | INTRAVENOUS | Status: DC

## 2024-01-15 NOTE — Progress Notes (Signed)
 Specialty Pharmacy Refill Coordination Note  MyChart Questionnaire Submission  Philip [name] is a 78 y.o. male contacted today regarding refills of specialty medication(s) Xtandi .  Doses on hand: (Patient-Rptd) 12   Patient requested: (Patient-Rptd) Pickup at [facility] at [facility] date: 01/26/24  Medication will be filled on 01/25/24.

## 2024-01-18 ENCOUNTER — Encounter: Payer: Self-pay | Admitting: Hematology and Oncology

## 2024-01-18 NOTE — Telephone Encounter (Signed)
 Orders have been placed in epic for Hep A total antibody and INR to be completed at [facility].

## 2024-01-19 ENCOUNTER — Other Ambulatory Visit (INDEPENDENT_AMBULATORY_CARE_PROVIDER_SITE_OTHER)

## 2024-01-19 DIAGNOSIS — R945 Abnormal results of liver function studies: Secondary | ICD-10-CM | POA: Diagnosis not present

## 2024-01-19 LAB — PROTIME-INR
INR: 1 ratio (ref 0.8–1.0)
Prothrombin Time: 10.9 s (ref 9.6–13.1)

## 2024-01-20 LAB — HEPATITIS A ANTIBODY, TOTAL: Hepatitis A AB,Total: NONREACTIVE

## 2024-01-21 ENCOUNTER — Ambulatory Visit: Payer: Self-pay | Admitting: Gastroenterology

## 2024-01-22 ENCOUNTER — Telehealth: Payer: Self-pay | Admitting: *Deleted

## 2024-01-22 NOTE — Telephone Encounter (Signed)
 Dr. Federico aware of Scan results and is in communication with pt.

## 2024-01-25 ENCOUNTER — Other Ambulatory Visit: Payer: Self-pay

## 2024-01-25 ENCOUNTER — Ambulatory Visit (INDEPENDENT_AMBULATORY_CARE_PROVIDER_SITE_OTHER)

## 2024-01-25 DIAGNOSIS — Z23 Encounter for immunization: Secondary | ICD-10-CM | POA: Diagnosis not present

## 2024-01-26 ENCOUNTER — Ambulatory Visit (HOSPITAL_BASED_OUTPATIENT_CLINIC_OR_DEPARTMENT_OTHER): Admitting: Physical Therapy

## 2024-01-29 ENCOUNTER — Other Ambulatory Visit: Payer: Self-pay | Admitting: Internal Medicine

## 2024-01-29 DIAGNOSIS — I10 Essential (primary) hypertension: Secondary | ICD-10-CM

## 2024-02-01 ENCOUNTER — Telehealth: Payer: Self-pay | Admitting: *Deleted

## 2024-02-01 ENCOUNTER — Other Ambulatory Visit: Payer: Self-pay | Admitting: *Deleted

## 2024-02-01 ENCOUNTER — Other Ambulatory Visit: Payer: Self-pay | Admitting: Hematology and Oncology

## 2024-02-01 DIAGNOSIS — C61 Malignant neoplasm of prostate: Secondary | ICD-10-CM

## 2024-02-01 DIAGNOSIS — J984 Other disorders of lung: Secondary | ICD-10-CM

## 2024-02-01 MED ORDER — AZITHROMYCIN 250 MG PO TABS: ORAL_TABLET | ORAL | 0 refills | Status: AC

## 2024-02-01 NOTE — Therapy (Signed)
 OUTPATIENT PHYSICAL THERAPY LOWER EXTREMITY EVALUATION   Patient Name: Philip [name] MRN: [id number] DOB:[birth year], 78 y.o., male Today's Date: 02/03/2024  END OF SESSION:  PT End of Session - 02/02/24 1224     Visit Number 1    Number of Visits 15    Date for Recertification  03/29/24    Authorization Type MCR    PT Start Time 1113    PT Stop Time 1203    PT Time Calculation (min) 50 min    Activity Tolerance Patient tolerated treatment well    Behavior During Therapy [facility] for tasks assessed/performed          Past Medical History:  Diagnosis Date   Allergy    Anxiety    Arthritis    Atherosclerosis of aorta 03/11/2017   The 10-year ASCVD risk score Verdon DC Jr., et al., 2013) is: 24.6%   Values used to calculate the score:     Age: 30 years     Sex: Male     Is Non-Hispanic African American: No     Diabetic: No     Tobacco smoker: No     Systolic Blood Pressure: 154 mmHg     Is BP treated: No     HDL Cholesterol: 71.1 mg/dL     Total Cholesterol: 212 mg/dL   Cancer (HCC)    PROSTATE   Cataract    Chronic idiopathic constipation 03/11/2017   Depression    Edema leg 11/29/2014   left leg from foot to thigh, increasinly worse over last 4 weeks   Essential hypertension 03/30/2018   Gastroesophageal reflux disease without esophagitis 03/11/2017   Hyperlipidemia LDL goal <100 03/16/2017   The 10-year ASCVD risk score Verdon DC Jr., et al., 2013) is: 30.6%   Values used to calculate the score:     Age: 51 years     Sex: Male     Is Non-Hispanic African American: No     Diabetic: No     Tobacco smoker: No     Systolic Blood Pressure: 148 mmHg     Is BP treated: Yes     HDL Cholesterol: 69.3 mg/dL     Total Cholesterol: 211 mg/dL   Hypertension    Hypoglycemia    Swelling LAST 30 DAYS DR [facility] AWARE   LEFT LEG AND FOOT   Past Surgical History:  Procedure Laterality Date   BOWEL DECOMPRESSION N/A 03/07/2021   Procedure: BOWEL DECOMPRESSION;  Surgeon: Leigh Elspeth SQUIBB, MD;   Location: [facility];  Service: Gastroenterology;  Laterality: N/A;   BOWEL DECOMPRESSION N/A 06/07/2021   Procedure: BOWEL DECOMPRESSION;  Surgeon: Rollin Dover, MD;  Location: [facility];  Service: Gastroenterology;  Laterality: N/A;   COLON SURGERY     FLEXIBLE SIGMOIDOSCOPY N/A 03/07/2021   Procedure: FLEXIBLE SIGMOIDOSCOPY;  Surgeon: Leigh Elspeth SQUIBB, MD;  Location: [facility];  Service: Gastroenterology;  Laterality: N/A;   FLEXIBLE SIGMOIDOSCOPY N/A 06/07/2021   Procedure: FLEXIBLE SIGMOIDOSCOPY;  Surgeon: Rollin Dover, MD;  Location: [facility];  Service: Gastroenterology;  Laterality: N/A;   GUM SURGERY     TEETH IMPLANTS ALSO   LAPAROSCOPIC SIGMOID COLECTOMY N/A 06/10/2021   Procedure: LAPAROSCOPIC ASSISTED SIGMOID COLECTOMY;  Surgeon: Gladis Cough, MD;  Location: [facility];  Service: General;  Laterality: N/A;   ORCHIECTOMY Bilateral 01/03/2015   Procedure: ORCHIECTOMY;  Surgeon: Ricardo Likens, MD;  Location: [facility];  Service: Urology;  Laterality: Bilateral;   Patient Active Problem List   Diagnosis Date Noted  Primary osteoarthritis of both knees 12/12/2023   Need for immunization against influenza 12/07/2023   Anemia due to zinc  deficiency 10/08/2022   Iron deficiency anemia 03/07/2021   Tinnitus of both ears 09/07/2019   Laryngopharyngeal reflux (LPR) 09/07/2019   Essential hypertension 03/30/2018   Port-A-Cath in place 08/25/2017   Hyperlipidemia LDL goal <100 03/16/2017   Atherosclerosis of aorta 03/11/2017   Chronic idiopathic constipation 03/11/2017   Seasonal allergic rhinitis due to pollen 03/11/2017   Gastroesophageal reflux disease without esophagitis 03/11/2017   Malignant neoplasm of bone (HCC) 10/16/2015   Prostate cancer (HCC) 02/02/2015    PCP: Joshua Debby CROME, MD   REFERRING PROVIDER: Joshua Debby CROME, MD   REFERRING DIAG: M17.0 (ICD-10-CM) - Primary osteoarthritis of both knees   THERAPY DIAG:  Left knee pain, unspecified  chronicity  Muscle weakness (generalized)  Difficulty in walking, not elsewhere classified  Stiffness of left knee, not elsewhere classified  Rationale for Evaluation and Treatment: Rehabilitation  ONSET DATE: PT order 12/12/23  SUBJECTIVE:   SUBJECTIVE STATEMENT: Pt received PT in 2023/2024 for gait instability, weakness, and Prostate CA.  Pt states PT helped.  Pt occasionally performs his HEP.  Pt has a hx of prostate CA and gets annual CT scans.  His recent CT scan showed some nonspecific changes in lungs with concern for possible infection.  CT showed inflammation or MAC infection in his right lung.  Pt spoke to Dr. Federico who referred him to pulmonology.  Pt saw pulmonology and he just started antibiotics yesterday.    Pt doesn't feel imbalanced.  He performs stairs daily and has difficulty with performing stairs.  He has difficulty carrying objects upstairs.  Pt is able to ambulate in grocery store though is limited with extended ambulation distance.  Pt was able to ambulate up/down hill, but unable to currently.  Pt also gets SOB due to his lungs.  Pt states his knee wants to lock up when he stands up after sitting for an hour or more.  Pt is limited with squatting.   Pt saw Dr. Joshua on 10/6 for a check up.  He had been having swelling and pain in his left calf since early August.  He has seen MD's concerning L LE swelling.  Patient has had DVT evaluation for similar symptoms in the past and was negative.  He has been checked out by vascular and he states everything vascularly was fine.  Pt had an EKG and had bloodwork which looked fine.  Pt had x rays which showed patellofemoral issues.    PERTINENT HISTORY: -Prostate CA with metastatic spread to lymph nodes and bone (dx 2016)--Pt is on oral chemotherapy.  Pt is followed by Dr. Federico and gets an annual CT scan.  Pt states his CA is not active.  Port-a-cath located on R side of chest. -On antibiotics currently due to possible lung  infection -L ankle OA, R knee arthrosis -anemia, anxiety, depression, HTN -Scarring on liver--Pt states for now the liver is ok    PAIN:  L knee:  0/10 at rest, 5-6/10 with extension, 9/10 worst  Pt denies pain in R knee.  PRECAUTIONS: Other: metastatic CA   WEIGHT BEARING RESTRICTIONS: No  FALLS:  Has patient fallen in last 6 months? No  LIVING ENVIRONMENT: Lives with: lives with their spouse Lives in: 3 story home Stairs: yes   PLOF: Independent  PATIENT GOALS: walk and climb stairs normal, improve strength.  L knee to function as R knee  OBJECTIVE:  Note: Objective measures were completed at Evaluation unless otherwise noted.  DIAGNOSTIC FINDINGS: L Tib/Fib X ray on 12/07/23: FINDINGS: No acute fracture or dislocation. Os trigonum. Moderate tricompartmental osteoarthritis of the knee, most severe in the patellofemoral compartment. Mild osteoarthritis of the ankle. Soft tissues are unremarkable.   IMPRESSION: 1. No acute fracture or dislocation. 2. Osteoarthritis of the knee and ankle, as above.  Knees x ray in 2021: IMPRESSION: 1. No fracture or dislocation of the bilateral knees. 2. There is bilateral arthrosis of the knees, most significant in the patellofemoral compartments and worse on the right, with relatively preserved femorotibial joint spaces. 3. There are large calcified loose bodies bilaterally, larger and more numerous on the right. 4. Small bilateral knee joint effusions.  CT on 11/14: IMPRESSION: 1. Stable sclerotic metastatic osseous disease without new or progressive findings or pathologic fracture. 2. Patchy tree-in-bud nodularity in the right middle lobe, suggestive of inflammatory process or atypical infection such as nontuberculous mycobacterial infection. 3. Liver morphology suggestive of cirrhosis without focal hepatic lesion. 4. No findings for metastatic disease soft tissue disease involving the chest abdomen or  pelvis.  PATIENT SURVEYS:  LEFS:  40/80  COGNITION: Overall cognitive status: Within functional limits for tasks assessed        LOWER EXTREMITY ROM:  AROM Right eval Left eval  Hip flexion    Hip extension    Hip abduction    Hip adduction    Hip internal rotation    Hip external rotation    Knee flexion 134 111  Knee extension 3/0 AROM/PROM 10/7 AROM/PROM in supine; Lacking 20 deg with LAQ  Ankle dorsiflexion    Ankle plantarflexion    Ankle inversion    Ankle eversion     (Blank rows = not tested)  LOWER EXTREMITY MMT:  MMT Right eval Left eval  Hip flexion 5/5 4+/5 with pain  Hip extension    Hip abduction Dificulty with correct form including keeping knee straight but tolerates good resistance <3/5  Hip adduction    Hip internal rotation    Hip external rotation    Knee flexion 5/5 seated 5/5 seated  Knee extension 5/5 4-/5 with pain  Ankle dorsiflexion    Ankle plantarflexion    Ankle inversion    Ankle eversion     (Blank rows = not tested)   FUNCTIONAL TESTS:  5x STS test: 23.3 sec without UE's  GAIT: Assistive device utilized: None Level of assistance: Complete Independence Comments: Pt has significant genu varus at L knee.  Decreased L knee extension, L toe off, and foot clearance.  Slow gait speed.  3-5/10 pain in L knee with walking in hallway.                                                                                                                                 TREATMENT:   See below for pt education.  PATIENT EDUCATION:  Education details: dx, objective findings, rationale of interventions, relevant anatomy, and POC.  PT answered pt's questions. Person educated: Patient Education method: Explanation Education comprehension: verbalized understanding and needs further education  HOME EXERCISE PROGRAM: Pt has a HEP.  Plan to update next visit.   ASSESSMENT:  CLINICAL IMPRESSION: Patient is a 78 y.o. male with a dx of bilat  knee OA.  Pt denies any pain in his R knee.  Pt has a hx of prostate CA with metastasis.  Pt has L knee pain with ambulation and states he is limited with extended ambulation distance.  He has gait deficits and has significant genu varus at L knee.  Pt has difficulty with performing stairs, limitations in squatting, and unable to ambulate up/down hills.  Pt has weakness in L LE and limitations in L knee ROM.  It required increased time for pt to complete the 5x STS test indicating functional LE weakness and difficulty with transfers.  Pt had his annual CT which showed a possible lung infection.  He reports having SOB which can also limit his walking.  He just started antibiotics.  Pt should benefit from skilled PT to address impairments and improve overall function.     OBJECTIVE IMPAIRMENTS: Abnormal gait, decreased activity tolerance, decreased endurance, decreased mobility, difficulty walking, decreased ROM, decreased strength, hypomobility, impaired flexibility, and pain.   ACTIVITY LIMITATIONS: carrying, bending, standing, squatting, stairs, transfers, and locomotion level  PARTICIPATION LIMITATIONS: cleaning, shopping, and community activity  PERSONAL FACTORS: 3+ comorbidities: metastatic CA, anemia, L ankle OA are also affecting patient's functional outcome.   REHAB POTENTIAL: Good  CLINICAL DECISION MAKING: Evolving/moderate complexity  EVALUATION COMPLEXITY: Moderate   GOALS:  SHORT TERM GOALS: Target date: 03/01/2024  Pt will be independent and compliant with HEP for improved pain, strength, ROM, and function. Baseline: Goal status: INITIAL  2.  Pt will demonstrate improved time on the 5x STS test by at least 5 seconds for improved functional LE strength and performance of transfers.  Baseline:  Goal status: INITIAL  3.  Pt will demonstrate improved L knee AROM by at least 5 degrees in both flexion and extension for improved stiffness, mobility, and gait.  Baseline:  Goal  status: INITIAL  4.  Pt will demo at least a 10 deg improvement with seated LAQ for improved quad strength and knee mobility.  Baseline:  Goal status: INITIAL  5.  Pt will report at least a 25% improvement overall in pain and daily mobility.  Baseline:  Goal status: INITIAL    LONG TERM GOALS: Target date: 03/29/2024  Pt will demo a reciprocal gait on the stairs with the rail and report he is able to perform his stairs at home without difficulty.  Baseline:  Goal status: INITIAL  2.  Pt will report he is able to perform extended community ambulation without significant difficulty and pain.  Baseline:  Goal status: INITIAL  3.  Pt will report at least a 60% improvement overall in his knee pain and daily mobility.  Baseline:  Goal status: INITIAL  4.  Pt will demo improved L hip flexion and knee extension strength to 5/5 MMT for improved performance of and tolerance with functional mobility.  (May add hip abd goal after testing next visit) Baseline:  Goal status: INITIAL  5.  Pt will perform the 5x STS test in < than 16 seconds for improved functional LE strength and performance of transfers Baseline:  Goal status: INITIAL     PLAN:  PT  FREQUENCY:1-2x/wk  PT DURATION: 8 weeks  PLANNED INTERVENTIONS: [id number]- PT Re-evaluation, 97750- Physical Performance Testing, 97110-Therapeutic exercises, 97530- Therapeutic activity, [id number]- Neuromuscular re-education, 97535- Self Care, [id number]- Manual therapy, [phone]- Gait training, [phone]- Aquatic Therapy, Patient/Family education, Balance training, Stair training, Taping, and Cryotherapy  PLAN FOR NEXT SESSION: calf and HS stretching.  LE strengthening.  Work on [employer].  Sit to stands.  Gait.  Test seated hip abduction with HHD next visit.    Leigh Minerva III PT, DPT 02/03/24 6:05 PM

## 2024-02-01 NOTE — Telephone Encounter (Signed)
 TCT patient regarding his CT scan results recommendations.  Spoke with him. Advised that Dr. Federico has spoken with pulmonary provider and their recommendation regarding recent CT scan results was to do a round of antibiotics and then repeat the CT scan in 6-8 weeks. Advised that if there was continued inflammation, they would see him in their pulmonary clinic. Joanthony voiced understanding. He will pick up his prescription today.

## 2024-02-02 ENCOUNTER — Ambulatory Visit (HOSPITAL_BASED_OUTPATIENT_CLINIC_OR_DEPARTMENT_OTHER): Admitting: Physical Therapy

## 2024-02-02 DIAGNOSIS — M25662 Stiffness of left knee, not elsewhere classified: Secondary | ICD-10-CM | POA: Insufficient documentation

## 2024-02-02 DIAGNOSIS — M25562 Pain in left knee: Secondary | ICD-10-CM | POA: Insufficient documentation

## 2024-02-02 DIAGNOSIS — M170 Bilateral primary osteoarthritis of knee: Secondary | ICD-10-CM | POA: Insufficient documentation

## 2024-02-02 DIAGNOSIS — M6281 Muscle weakness (generalized): Secondary | ICD-10-CM | POA: Diagnosis present

## 2024-02-02 DIAGNOSIS — R262 Difficulty in walking, not elsewhere classified: Secondary | ICD-10-CM | POA: Diagnosis present

## 2024-02-03 ENCOUNTER — Encounter (HOSPITAL_BASED_OUTPATIENT_CLINIC_OR_DEPARTMENT_OTHER): Payer: Self-pay | Admitting: Physical Therapy

## 2024-02-03 ENCOUNTER — Other Ambulatory Visit: Payer: Self-pay

## 2024-02-04 ENCOUNTER — Inpatient Hospital Stay: Attending: Oncology

## 2024-02-04 DIAGNOSIS — C7951 Secondary malignant neoplasm of bone: Secondary | ICD-10-CM | POA: Diagnosis not present

## 2024-02-04 DIAGNOSIS — C61 Malignant neoplasm of prostate: Secondary | ICD-10-CM | POA: Diagnosis present

## 2024-02-04 LAB — CBC WITH DIFFERENTIAL (CANCER CENTER ONLY)
Abs Immature Granulocytes: 0.02 K/uL (ref 0.00–0.07)
Basophils Absolute: 0 K/uL (ref 0.0–0.1)
Basophils Relative: 1 %
Eosinophils Absolute: 0.1 K/uL (ref 0.0–0.5)
Eosinophils Relative: 3 %
HCT: 35.3 % — ABNORMAL LOW (ref 39.0–52.0)
Hemoglobin: 11.7 g/dL — ABNORMAL LOW (ref 13.0–17.0)
Immature Granulocytes: 0 %
Lymphocytes Relative: 35 %
Lymphs Abs: 1.8 K/uL (ref 0.7–4.0)
MCH: 31.6 pg (ref 26.0–34.0)
MCHC: 33.1 g/dL (ref 30.0–36.0)
MCV: 95.4 fL (ref 80.0–100.0)
Monocytes Absolute: 0.5 K/uL (ref 0.1–1.0)
Monocytes Relative: 10 %
Neutro Abs: 2.7 K/uL (ref 1.7–7.7)
Neutrophils Relative %: 51 %
Platelet Count: 221 K/uL (ref 150–400)
RBC: 3.7 MIL/uL — ABNORMAL LOW (ref 4.22–5.81)
RDW: 13.4 % (ref 11.5–15.5)
WBC Count: 5.1 K/uL (ref 4.0–10.5)
nRBC: 0 % (ref 0.0–0.2)

## 2024-02-04 LAB — CMP (CANCER CENTER ONLY)
ALT: 12 U/L (ref 0–44)
AST: 21 U/L (ref 15–41)
Albumin: 4.4 g/dL (ref 3.5–5.0)
Alkaline Phosphatase: 58 U/L (ref 38–126)
Anion gap: 10 (ref 5–15)
BUN: 16 mg/dL (ref 8–23)
CO2: 26 mmol/L (ref 22–32)
Calcium: 9.4 mg/dL (ref 8.9–10.3)
Chloride: 101 mmol/L (ref 98–111)
Creatinine: 1.25 mg/dL — ABNORMAL HIGH (ref 0.61–1.24)
GFR, Estimated: 59 mL/min — ABNORMAL LOW (ref >60)
Glucose, Bld: 103 mg/dL — ABNORMAL HIGH (ref 70–99)
Potassium: 4.8 mmol/L (ref 3.5–5.1)
Sodium: 136 mmol/L (ref 135–145)
Total Bilirubin: 0.3 mg/dL (ref 0.0–1.2)
Total Protein: 6.8 g/dL (ref 6.5–8.1)

## 2024-02-04 LAB — PSA: Prostatic Specific Antigen: 0.03 ng/mL (ref 0.00–4.00)

## 2024-02-05 ENCOUNTER — Inpatient Hospital Stay

## 2024-02-05 VITALS — BP 150/86 | HR 81 | Resp 17

## 2024-02-05 DIAGNOSIS — C61 Malignant neoplasm of prostate: Secondary | ICD-10-CM

## 2024-02-05 DIAGNOSIS — Z95828 Presence of other vascular implants and grafts: Secondary | ICD-10-CM

## 2024-02-05 MED ORDER — DENOSUMAB 120 MG/1.7ML ~~LOC~~ SOLN
120.0000 mg | Freq: Once | SUBCUTANEOUS | Status: AC
  Administered 2024-02-05: 120 mg via SUBCUTANEOUS
  Filled 2024-02-05: qty 1.7

## 2024-02-09 ENCOUNTER — Encounter (HOSPITAL_BASED_OUTPATIENT_CLINIC_OR_DEPARTMENT_OTHER): Payer: Self-pay | Admitting: Physical Therapy

## 2024-02-09 ENCOUNTER — Ambulatory Visit (HOSPITAL_BASED_OUTPATIENT_CLINIC_OR_DEPARTMENT_OTHER): Admitting: Physical Therapy

## 2024-02-09 DIAGNOSIS — M25562 Pain in left knee: Secondary | ICD-10-CM | POA: Diagnosis not present

## 2024-02-09 NOTE — Therapy (Signed)
 OUTPATIENT PHYSICAL THERAPY LOWER EXTREMITY EVALUATION   Patient Name: Philip [name] MRN: [id number] DOB:08-30-1945, 78 y.o., male Today's Date: 02/09/2024  END OF SESSION:    Past Medical History:  Diagnosis Date   Allergy    Anxiety    Arthritis    Atherosclerosis of aorta 03/11/2017   The 10-year ASCVD risk score Verdon DC Jr., et al., 2013) is: 24.6%   Values used to calculate the score:     Age: 89 years     Sex: Male     Is Non-Hispanic African American: No     Diabetic: No     Tobacco smoker: No     Systolic Blood Pressure: 154 mmHg     Is BP treated: No     HDL Cholesterol: 71.1 mg/dL     Total Cholesterol: 212 mg/dL   Cancer (HCC)    PROSTATE   Cataract    Chronic idiopathic constipation 03/11/2017   Depression    Edema leg 11/29/2014   left leg from foot to thigh, increasinly worse over last 4 weeks   Essential hypertension 03/30/2018   Gastroesophageal reflux disease without esophagitis 03/11/2017   Hyperlipidemia LDL goal <100 03/16/2017   The 10-year ASCVD risk score Verdon DC Jr., et al., 2013) is: 30.6%   Values used to calculate the score:     Age: 72 years     Sex: Male     Is Non-Hispanic African American: No     Diabetic: No     Tobacco smoker: No     Systolic Blood Pressure: 148 mmHg     Is BP treated: Yes     HDL Cholesterol: 69.3 mg/dL     Total Cholesterol: 211 mg/dL   Hypertension    Hypoglycemia    Swelling LAST 30 DAYS DR [facility] AWARE   LEFT LEG AND FOOT   Past Surgical History:  Procedure Laterality Date   BOWEL DECOMPRESSION N/A 03/07/2021   Procedure: BOWEL DECOMPRESSION;  Surgeon: Leigh Elspeth SQUIBB, MD;  Location: [facility];  Service: Gastroenterology;  Laterality: N/A;   BOWEL DECOMPRESSION N/A 06/07/2021   Procedure: BOWEL DECOMPRESSION;  Surgeon: Rollin Dover, MD;  Location: [facility];  Service: Gastroenterology;  Laterality: N/A;   COLON SURGERY     FLEXIBLE SIGMOIDOSCOPY N/A 03/07/2021   Procedure: FLEXIBLE SIGMOIDOSCOPY;  Surgeon:  Leigh Elspeth SQUIBB, MD;  Location: [facility];  Service: Gastroenterology;  Laterality: N/A;   FLEXIBLE SIGMOIDOSCOPY N/A 06/07/2021   Procedure: FLEXIBLE SIGMOIDOSCOPY;  Surgeon: Rollin Dover, MD;  Location: [facility];  Service: Gastroenterology;  Laterality: N/A;   GUM SURGERY     TEETH IMPLANTS ALSO   LAPAROSCOPIC SIGMOID COLECTOMY N/A 06/10/2021   Procedure: LAPAROSCOPIC ASSISTED SIGMOID COLECTOMY;  Surgeon: Gladis Cough, MD;  Location: [facility];  Service: General;  Laterality: N/A;   ORCHIECTOMY Bilateral 01/03/2015   Procedure: ORCHIECTOMY;  Surgeon: Ricardo Likens, MD;  Location: [facility];  Service: Urology;  Laterality: Bilateral;   Patient Active Problem List   Diagnosis Date Noted   Primary osteoarthritis of both knees 12/12/2023   Need for immunization against influenza 12/07/2023   Anemia due to zinc  deficiency 10/08/2022   Iron deficiency anemia 03/07/2021   Tinnitus of both ears 09/07/2019   Laryngopharyngeal reflux (LPR) 09/07/2019   Essential hypertension 03/30/2018   Port-A-Cath in place 08/25/2017   Hyperlipidemia LDL goal <100 03/16/2017   Atherosclerosis of aorta 03/11/2017   Chronic idiopathic constipation 03/11/2017   Seasonal allergic rhinitis due to pollen 03/11/2017   Gastroesophageal  reflux disease without esophagitis 03/11/2017   Malignant neoplasm of bone (HCC) 10/16/2015   Prostate cancer (HCC) 02/02/2015    PCP: Joshua Debby CROME, MD   REFERRING PROVIDER: Joshua Debby CROME, MD   REFERRING DIAG: M17.0 (ICD-10-CM) - Primary osteoarthritis of both knees   THERAPY DIAG:  No diagnosis found.  Stiffness of left knee, not elsewhere classified  Rationale for Evaluation and Treatment: Rehabilitation  ONSET DATE: PT order 12/12/23  SUBJECTIVE:   SUBJECTIVE STATEMENT: Pt used a Tylenol /lidocaine  roll-on this AM.  Pt had no adverse effects after prior treatment and felt encouraged after treatment.  Pt completed his antibiotics.   Pt received PT in  2023/2024 for gait instability, weakness, and Prostate CA.  Pt states PT helped.  Pt occasionally performs his HEP.  Pt has a hx of prostate CA and gets annual CT scans.  His recent CT scan showed some nonspecific changes in lungs with concern for possible infection.  CT showed inflammation or MAC infection in his right lung.  Pt spoke to Dr. Federico who referred him to pulmonology.  Pt saw pulmonology and he just started antibiotics yesterday.    Pt doesn't feel imbalanced.  He performs stairs daily and has difficulty with performing stairs.  He has difficulty carrying objects upstairs.  Pt is able to ambulate in grocery store though is limited with extended ambulation distance.  Pt was able to ambulate up/down hill, but unable to currently.  Pt also gets SOB due to his lungs.  Pt states his knee wants to lock up when he stands up after sitting for an hour or more.  Pt is limited with squatting.   Pt saw Dr. Joshua on 10/6 for a check up.  He had been having swelling and pain in his left calf since early August.  He has seen MD's concerning L LE swelling.  Patient has had DVT evaluation for similar symptoms in the past and was negative.  He has been checked out by vascular and he states everything vascularly was fine.  Pt had an EKG and had bloodwork which looked fine.  Pt had x rays which showed patellofemoral issues.    PERTINENT HISTORY: -Prostate CA with metastatic spread to lymph nodes and bone (dx 2016)--Pt is on oral chemotherapy.  Pt is followed by Dr. Federico and gets an annual CT scan.  Pt states his CA is not active.  Port-a-cath located on R side of chest. -On antibiotics currently due to possible lung infection -L ankle OA, R knee arthrosis -anemia, anxiety, depression, HTN -Scarring on liver--Pt states for now the liver is ok    PAIN:  L knee:  2/10 at rest, 5-6/10 with extension, 9/10 worst  Pt denies pain in R knee.  PRECAUTIONS: Other: metastatic CA   WEIGHT BEARING  RESTRICTIONS: No  FALLS:  Has patient fallen in last 6 months? No  LIVING ENVIRONMENT: Lives with: lives with their spouse Lives in: 3 story home Stairs: yes   PLOF: Independent  PATIENT GOALS: walk and climb stairs normal, improve strength.  L knee to function as R knee   OBJECTIVE:  Note: Objective measures were completed at Evaluation unless otherwise noted.  DIAGNOSTIC FINDINGS: L Tib/Fib X ray on 12/07/23: FINDINGS: No acute fracture or dislocation. Os trigonum. Moderate tricompartmental osteoarthritis of the knee, most severe in the patellofemoral compartment. Mild osteoarthritis of the ankle. Soft tissues are unremarkable.   IMPRESSION: 1. No acute fracture or dislocation. 2. Osteoarthritis of the knee and ankle, as  above.  Knees x ray in 2021: IMPRESSION: 1. No fracture or dislocation of the bilateral knees. 2. There is bilateral arthrosis of the knees, most significant in the patellofemoral compartments and worse on the right, with relatively preserved femorotibial joint spaces. 3. There are large calcified loose bodies bilaterally, larger and more numerous on the right. 4. Small bilateral knee joint effusions.  CT on 11/14: IMPRESSION: 1. Stable sclerotic metastatic osseous disease without new or progressive findings or pathologic fracture. 2. Patchy tree-in-bud nodularity in the right middle lobe, suggestive of inflammatory process or atypical infection such as nontuberculous mycobacterial infection. 3. Liver morphology suggestive of cirrhosis without focal hepatic lesion. 4. No findings for metastatic disease soft tissue disease involving the chest abdomen or pelvis.  PATIENT SURVEYS:  LEFS:  40/80  COGNITION: Overall cognitive status: Within functional limits for tasks assessed        LOWER EXTREMITY ROM:  AROM Right eval Left eval  Hip flexion    Hip extension    Hip abduction    Hip adduction    Hip internal rotation    Hip external  rotation    Knee flexion 134 111  Knee extension 3/0 AROM/PROM 10/7 AROM/PROM in supine; Lacking 20 deg with LAQ  Ankle dorsiflexion    Ankle plantarflexion    Ankle inversion    Ankle eversion     (Blank rows = not tested)  LOWER EXTREMITY MMT:  MMT Right eval Left eval Right 12/9 Left 12/9  Hip flexion 5/5 4+/5 with pain    Hip extension      Hip abduction Dificulty with correct form including keeping knee straight but tolerates good resistance <3/5 29.4 24.4  Hip adduction      Hip internal rotation      Hip external rotation      Knee flexion 5/5 seated 5/5 seated    Knee extension 5/5 4-/5 with pain    Ankle dorsiflexion      Ankle plantarflexion      Ankle inversion      Ankle eversion       (Blank rows = not tested)   FUNCTIONAL TESTS:  5x STS test: 23.3 sec without UE's  GAIT: Assistive device utilized: None Level of assistance: Complete Independence Comments: Pt has significant genu varus at L knee.  Decreased L knee extension, L toe off, and foot clearance.  Slow gait speed.  3-5/10 pain in L knee with walking in hallway.                                                                                                                                 TREATMENT:   Assessed hip abduction strength with HHD.  See above  Nustep LE's  lvl 3 LAQ 2x10 R, approx 12, 5 on L Supine bridge 2x10 Supine heel slides AROM x 10, with strap x 10 reps  Supine SLR x 10 on R, very limited with  height with L supine SLR Seated heel slides x 10 with towel Pt unable to perform calf stretch in longsitting with strap Seated HS stretch 3x20 sec bilat Sit to stands from elevated table with hands on knees x 5 reps  See below for pt education.  PATIENT EDUCATION:  Education details: dx, objective findings, rationale of interventions, relevant anatomy, and POC.  PT answered pt's questions. Person educated: Patient Education method: Explanation Education comprehension: verbalized  understanding and needs further education  HOME EXERCISE PROGRAM: Pt has a HEP.   Access Code: [email or web address] URL: [email or web address] Date: 02/09/2024 Prepared by: Mose Minerva  Exercises - Supine Bridge  - 1 x daily - 7 x weekly - 2 sets - 10 reps - Seated Heel Slide  - 2 x daily - 7 x weekly - 2 sets - 10 reps - Seated Hamstring Stretch  - 2 x daily - 7 x weekly - 2 reps - 20 seconds hold  ASSESSMENT:  CLINICAL IMPRESSION: Patient is a 78 y.o. male with a dx of bilat knee OA.  Pt denies any pain in his R knee.  Pt has a hx of prostate CA with metastasis.  Pt has L knee pain with ambulation and states he is limited with extended ambulation distance.  He has gait deficits and has significant genu varus at L knee.  Pt has difficulty with performing stairs, limitations in squatting, and unable to ambulate up/down hills.  Pt has weakness in L LE and limitations in L knee ROM.  It required increased time for pt to complete the 5x STS test indicating functional LE weakness and difficulty with transfers.  Pt had his annual CT which showed a possible lung infection.  He reports having SOB which can also limit his walking.  He just started antibiotics.  Pt should benefit from skilled PT to address impairments and improve overall function.    Pain in L knee with LAQ and supine heel slides very limited with height with L supine SLR and unable to straighten knee  OBJECTIVE IMPAIRMENTS: Abnormal gait, decreased activity tolerance, decreased endurance, decreased mobility, difficulty walking, decreased ROM, decreased strength, hypomobility, impaired flexibility, and pain.   ACTIVITY LIMITATIONS: carrying, bending, standing, squatting, stairs, transfers, and locomotion level  PARTICIPATION LIMITATIONS: cleaning, shopping, and community activity  PERSONAL FACTORS: 3+ comorbidities: metastatic CA, anemia, L ankle OA are also affecting patient's functional outcome.   REHAB POTENTIAL:  Good  CLINICAL DECISION MAKING: Evolving/moderate complexity  EVALUATION COMPLEXITY: Moderate   GOALS:  SHORT TERM GOALS: Target date: 03/01/2024  Pt will be independent and compliant with HEP for improved pain, strength, ROM, and function. Baseline: Goal status: INITIAL  2.  Pt will demonstrate improved time on the 5x STS test by at least 5 seconds for improved functional LE strength and performance of transfers.  Baseline:  Goal status: INITIAL  3.  Pt will demonstrate improved L knee AROM by at least 5 degrees in both flexion and extension for improved stiffness, mobility, and gait.  Baseline:  Goal status: INITIAL  4.  Pt will demo at least a 10 deg improvement with seated LAQ for improved quad strength and knee mobility.  Baseline:  Goal status: INITIAL  5.  Pt will report at least a 25% improvement overall in pain and daily mobility.  Baseline:  Goal status: INITIAL    LONG TERM GOALS: Target date: 03/29/2024  Pt will demo a reciprocal gait on the stairs with the rail and report he is able to perform  his stairs at home without difficulty.  Baseline:  Goal status: INITIAL  2.  Pt will report he is able to perform extended community ambulation without significant difficulty and pain.  Baseline:  Goal status: INITIAL  3.  Pt will report at least a 60% improvement overall in his knee pain and daily mobility.  Baseline:  Goal status: INITIAL  4.  Pt will demo improved L hip flexion and knee extension strength to 5/5 MMT for improved performance of and tolerance with functional mobility.  (May add hip abd goal after testing next visit) Baseline:  Goal status: INITIAL  5.  Pt will perform the 5x STS test in < than 16 seconds for improved functional LE strength and performance of transfers Baseline:  Goal status: INITIAL     PLAN:  PT FREQUENCY:1-2x/wk  PT DURATION: 8 weeks  PLANNED INTERVENTIONS: [id number]- PT Re-evaluation, 97750- Physical Performance  Testing, 97110-Therapeutic exercises, 97530- Therapeutic activity, [id number]- Neuromuscular re-education, 97535- Self Care, [id number]- Manual therapy, [id number]- Gait training, [phone]- Aquatic Therapy, Patient/Family education, Balance training, Stair training, Taping, and Cryotherapy  PLAN FOR NEXT SESSION: calf and HS stretching.  LE strengthening.  Work on [employer].  Sit to stands.  Gait.  Supine static knee extension stretch   Leigh Minerva III PT, DPT 02/09/24 11:17 AM

## 2024-02-10 ENCOUNTER — Encounter

## 2024-02-10 ENCOUNTER — Encounter (HOSPITAL_COMMUNITY)

## 2024-02-11 ENCOUNTER — Other Ambulatory Visit: Payer: Self-pay | Admitting: Internal Medicine

## 2024-02-11 DIAGNOSIS — E876 Hypokalemia: Secondary | ICD-10-CM

## 2024-02-11 DIAGNOSIS — I700 Atherosclerosis of aorta: Secondary | ICD-10-CM

## 2024-02-11 DIAGNOSIS — E785 Hyperlipidemia, unspecified: Secondary | ICD-10-CM

## 2024-02-11 DIAGNOSIS — I10 Essential (primary) hypertension: Secondary | ICD-10-CM

## 2024-02-16 ENCOUNTER — Encounter (HOSPITAL_BASED_OUTPATIENT_CLINIC_OR_DEPARTMENT_OTHER): Payer: Self-pay | Admitting: Physical Therapy

## 2024-02-16 ENCOUNTER — Other Ambulatory Visit (HOSPITAL_COMMUNITY): Payer: Self-pay

## 2024-02-16 ENCOUNTER — Other Ambulatory Visit: Payer: Self-pay

## 2024-02-16 ENCOUNTER — Ambulatory Visit (HOSPITAL_BASED_OUTPATIENT_CLINIC_OR_DEPARTMENT_OTHER): Admitting: Physical Therapy

## 2024-02-16 DIAGNOSIS — M25562 Pain in left knee: Secondary | ICD-10-CM | POA: Diagnosis not present

## 2024-02-16 NOTE — Progress Notes (Signed)
 Specialty Pharmacy Refill Coordination Note  Spoke with Philip [name]  [name] is a 78 y.o. male contacted today regarding refills of specialty medication(s) Enzalutamide  (XTANDI )  Doses on hand: 10 (40 tabs)   Patient requested: Pickup at [facility] at [place] date: 02/18/24  Medication will be filled on 02/17/24

## 2024-02-16 NOTE — Therapy (Signed)
 OUTPATIENT PHYSICAL THERAPY LOWER EXTREMITY TREATMENT   Patient Name: Philip [name] MRN: [id number] DOB:1945-09-10, 78 y.o., male Today's Date: 02/17/2024  END OF SESSION:  PT End of Session - 02/16/24 1112     Visit Number 3    Number of Visits 15    Date for Recertification  03/29/24    Authorization Type MCR    PT Start Time 1110    PT Stop Time 1151    PT Time Calculation (min) 41 min    Activity Tolerance Patient tolerated treatment well    Behavior During Therapy [facility] for tasks assessed/performed           Past Medical History:  Diagnosis Date   Allergy    Anxiety    Arthritis    Atherosclerosis of aorta 03/11/2017   The 10-year ASCVD risk score Verdon DC Jr., et al., 2013) is: 24.6%   Values used to calculate the score:     Age: 68 years     Sex: Male     Is Non-Hispanic African American: No     Diabetic: No     Tobacco smoker: No     Systolic Blood Pressure: 154 mmHg     Is BP treated: No     HDL Cholesterol: 71.1 mg/dL     Total Cholesterol: 212 mg/dL   Cancer (HCC)    PROSTATE   Cataract    Chronic idiopathic constipation 03/11/2017   Depression    Edema leg 11/29/2014   left leg from foot to thigh, increasinly worse over last 4 weeks   Essential hypertension 03/30/2018   Gastroesophageal reflux disease without esophagitis 03/11/2017   Hyperlipidemia LDL goal <100 03/16/2017   The 10-year ASCVD risk score Verdon DC Jr., et al., 2013) is: 30.6%   Values used to calculate the score:     Age: 1 years     Sex: Male     Is Non-Hispanic African American: No     Diabetic: No     Tobacco smoker: No     Systolic Blood Pressure: 148 mmHg     Is BP treated: Yes     HDL Cholesterol: 69.3 mg/dL     Total Cholesterol: 211 mg/dL   Hypertension    Hypoglycemia    Swelling LAST 30 DAYS DR [facility] AWARE   LEFT LEG AND FOOT   Past Surgical History:  Procedure Laterality Date   BOWEL DECOMPRESSION N/A 03/07/2021   Procedure: BOWEL DECOMPRESSION;  Surgeon: Leigh Elspeth SQUIBB, MD;   Location: [facility];  Service: Gastroenterology;  Laterality: N/A;   BOWEL DECOMPRESSION N/A 06/07/2021   Procedure: BOWEL DECOMPRESSION;  Surgeon: Rollin Dover, MD;  Location: [facility];  Service: Gastroenterology;  Laterality: N/A;   COLON SURGERY     FLEXIBLE SIGMOIDOSCOPY N/A 03/07/2021   Procedure: FLEXIBLE SIGMOIDOSCOPY;  Surgeon: Leigh Elspeth SQUIBB, MD;  Location: [facility];  Service: Gastroenterology;  Laterality: N/A;   FLEXIBLE SIGMOIDOSCOPY N/A 06/07/2021   Procedure: FLEXIBLE SIGMOIDOSCOPY;  Surgeon: Rollin Dover, MD;  Location: [facility];  Service: Gastroenterology;  Laterality: N/A;   GUM SURGERY     TEETH IMPLANTS ALSO   LAPAROSCOPIC SIGMOID COLECTOMY N/A 06/10/2021   Procedure: LAPAROSCOPIC ASSISTED SIGMOID COLECTOMY;  Surgeon: Gladis Cough, MD;  Location: [facility];  Service: General;  Laterality: N/A;   ORCHIECTOMY Bilateral 01/03/2015   Procedure: ORCHIECTOMY;  Surgeon: Ricardo Likens, MD;  Location: [facility];  Service: Urology;  Laterality: Bilateral;   Patient Active Problem List   Diagnosis Date Noted  Primary osteoarthritis of both knees 12/12/2023   Need for immunization against influenza 12/07/2023   Anemia due to zinc  deficiency 10/08/2022   Iron deficiency anemia 03/07/2021   Tinnitus of both ears 09/07/2019   Laryngopharyngeal reflux (LPR) 09/07/2019   Essential hypertension 03/30/2018   Port-A-Cath in place 08/25/2017   Hyperlipidemia LDL goal <100 03/16/2017   Atherosclerosis of aorta 03/11/2017   Chronic idiopathic constipation 03/11/2017   Seasonal allergic rhinitis due to pollen 03/11/2017   Gastroesophageal reflux disease without esophagitis 03/11/2017   Malignant neoplasm of bone (HCC) 10/16/2015   Prostate cancer (HCC) 02/02/2015    PCP: Joshua Debby CROME, MD   REFERRING PROVIDER: Joshua Debby CROME, MD   REFERRING DIAG: M17.0 (ICD-10-CM) - Primary osteoarthritis of both knees   THERAPY DIAG:  Left knee pain, unspecified  chronicity  Muscle weakness (generalized)  Difficulty in walking, not elsewhere classified  Stiffness of left knee, not elsewhere classified  Stiffness of left knee, not elsewhere classified  Rationale for Evaluation and Treatment: Rehabilitation  ONSET DATE: PT order 12/12/23  SUBJECTIVE:   SUBJECTIVE STATEMENT: Pt reports he performed his home exercises and the exercises are helping.  He used a Tylenol /lidocaine  roll-on this AM.  Pt denies any adverse effects after prior treatment.   PERTINENT HISTORY: -Prostate CA with metastatic spread to lymph nodes and bone (dx 2016)--Pt is on oral chemotherapy.  Pt is followed by Dr. Federico and gets an annual CT scan.  Pt states his CA is not active.  Port-a-cath located on R side of chest. -On antibiotics currently due to possible lung infection -L ankle OA, R knee arthrosis -anemia, anxiety, depression, HTN -Scarring on liver--Pt states for now the liver is ok    PAIN:  L knee:  3/10 in standing but can increase 6-8/10 with movement  Pt denies pain in R knee.  PRECAUTIONS: Other: metastatic CA   WEIGHT BEARING RESTRICTIONS: No  FALLS:  Has patient fallen in last 6 months? No  LIVING ENVIRONMENT: Lives with: lives with their spouse Lives in: 3 story home Stairs: yes   PLOF: Independent  PATIENT GOALS: walk and climb stairs normal, improve strength.  L knee to function as R knee   OBJECTIVE:  Note: Objective measures were completed at Evaluation unless otherwise noted.  DIAGNOSTIC FINDINGS: L Tib/Fib X ray on 12/07/23: FINDINGS: No acute fracture or dislocation. Os trigonum. Moderate tricompartmental osteoarthritis of the knee, most severe in the patellofemoral compartment. Mild osteoarthritis of the ankle. Soft tissues are unremarkable.   IMPRESSION: 1. No acute fracture or dislocation. 2. Osteoarthritis of the knee and ankle, as above.  Knees x ray in 2021: IMPRESSION: 1. No fracture or dislocation of the  bilateral knees. 2. There is bilateral arthrosis of the knees, most significant in the patellofemoral compartments and worse on the right, with relatively preserved femorotibial joint spaces. 3. There are large calcified loose bodies bilaterally, larger and more numerous on the right. 4. Small bilateral knee joint effusions.  CT on 11/14: IMPRESSION: 1. Stable sclerotic metastatic osseous disease without new or progressive findings or pathologic fracture. 2. Patchy tree-in-bud nodularity in the right middle lobe, suggestive of inflammatory process or atypical infection such as nontuberculous mycobacterial infection. 3. Liver morphology suggestive of cirrhosis without focal hepatic lesion. 4. No findings for metastatic disease soft tissue disease involving the chest abdomen or pelvis.  PATIENT SURVEYS:  LEFS:  40/80  COGNITION: Overall cognitive status: Within functional limits for tasks assessed  LOWER EXTREMITY ROM:  AROM Right eval Left eval  Hip flexion    Hip extension    Hip abduction    Hip adduction    Hip internal rotation    Hip external rotation    Knee flexion 134 111  Knee extension 3/0 AROM/PROM 10/7 AROM/PROM in supine; Lacking 20 deg with LAQ  Ankle dorsiflexion    Ankle plantarflexion    Ankle inversion    Ankle eversion     (Blank rows = not tested)  LOWER EXTREMITY MMT:  MMT Right eval Left eval Right 12/9 Left 12/9  Hip flexion 5/5 4+/5 with pain    Hip extension      Hip abduction Dificulty with correct form including keeping knee straight but tolerates good resistance <3/5 29.4 24.4  Hip adduction      Hip internal rotation      Hip external rotation      Knee flexion 5/5 seated 5/5 seated    Knee extension 5/5 4-/5 with pain    Ankle dorsiflexion      Ankle plantarflexion      Ankle inversion      Ankle eversion       (Blank rows = not tested)   FUNCTIONAL TESTS:  5x STS test: 23.3 sec without UE's  GAIT: Assistive  device utilized: None Level of assistance: Complete Independence Comments: Pt has significant genu varus at L knee.  Decreased L knee extension, L toe off, and foot clearance.  Slow gait speed.  3-5/10 pain in L knee with walking in hallway.                                                                                                                                 TREATMENT:   Nustep bilat UE/LE's lvl 4 x 5 mins LAQ L:  2x10, R:  x10 0#, x10 2# Seated HS stretch 2x20-30 sec bilat Supine heel slides with strap approx 12 reps Supine bridge x10, 5 reps Supine static knee extension stretch x 2 mins Sit to stands from elevated table with hands on knees 2x5 reps Pt received L knee flexion and extension PROM in supine per pt tolerance.    Reviewed HEP.  Updated HEP and gave pt a HEP handout.  PT educated in correct form and appropriate frequency.  See below for pt education.  PATIENT EDUCATION:  Education details: dx, exercise form, HEP rationale of interventions, relevant anatomy, and POC.  PT answered pt's questions. Person educated: Patient Education method: Explanation, demonstration, verbal and tactile cues, handout Education comprehension: verbalized understanding and needs further education, returned demonstration, verbal and tactile cues required  HOME EXERCISE PROGRAM: Pt has a HEP.   Access Code: [email or web address] URL: [email or web address] Date: 02/09/2024 Prepared by: Mose Minerva  Exercises - Supine Bridge  - 1 x daily - 7 x weekly - 2 sets - 10 reps - Seated Heel Slide  - 2 x daily - 7  x weekly - 2 sets - 10 reps - Seated Hamstring Stretch  - 2 x daily - 7 x weekly - 2 reps - 20 seconds hold  Updated HEP: - Seated Long Arc Quad  - 1 x daily - 7 x weekly - 2 sets - 10 reps - Supine Knee Extension Stretch on Towel Roll  - 2-3 x daily - 7 x weekly - 1 reps - 2 minutes hold  ASSESSMENT:  CLINICAL IMPRESSION: Pt's limp is worse when he first gets up.  PT  provided instruction and cuing for correct form with exercises.  Pt demonstrates improved tolerance with exercises including LAQ.  He continues to have pain with supine heel slides which is worse when going into extension.  PT instructed pt to perform supine heel slides within a tolerable range.  PT reviewed and updated HEP and he demonstrates good understanding of HEP.  Pt responded well to treatment having no increased pain after treatment.  Pt should benefit from skilled PT to address impairments and goals and to improve overall function.        OBJECTIVE IMPAIRMENTS: Abnormal gait, decreased activity tolerance, decreased endurance, decreased mobility, difficulty walking, decreased ROM, decreased strength, hypomobility, impaired flexibility, and pain.   ACTIVITY LIMITATIONS: carrying, bending, standing, squatting, stairs, transfers, and locomotion level  PARTICIPATION LIMITATIONS: cleaning, shopping, and community activity  PERSONAL FACTORS: 3+ comorbidities: metastatic CA, anemia, L ankle OA are also affecting patient's functional outcome.   REHAB POTENTIAL: Good  CLINICAL DECISION MAKING: Evolving/moderate complexity  EVALUATION COMPLEXITY: Moderate   GOALS:  SHORT TERM GOALS: Target date: 03/01/2024  Pt will be independent and compliant with HEP for improved pain, strength, ROM, and function. Baseline: Goal status: INITIAL  2.  Pt will demonstrate improved time on the 5x STS test by at least 5 seconds for improved functional LE strength and performance of transfers.  Baseline:  Goal status: INITIAL  3.  Pt will demonstrate improved L knee AROM by at least 5 degrees in both flexion and extension for improved stiffness, mobility, and gait.  Baseline:  Goal status: INITIAL  4.  Pt will demo at least a 10 deg improvement with seated LAQ for improved quad strength and knee mobility.  Baseline:  Goal status: INITIAL  5.  Pt will report at least a 25% improvement overall in pain  and daily mobility.  Baseline:  Goal status: INITIAL    LONG TERM GOALS: Target date: 03/29/2024  Pt will demo a reciprocal gait on the stairs with the rail and report he is able to perform his stairs at home without difficulty.  Baseline:  Goal status: INITIAL  2.  Pt will report he is able to perform extended community ambulation without significant difficulty and pain.  Baseline:  Goal status: INITIAL  3.  Pt will report at least a 60% improvement overall in his knee pain and daily mobility.  Baseline:  Goal status: INITIAL  4.  Pt will demo improved L hip flexion and knee extension strength to 5/5 MMT for improved performance of and tolerance with functional mobility.  (May add hip abd goal after testing next visit) Baseline:  Goal status: INITIAL  5.  Pt will perform the 5x STS test in < than 16 seconds for improved functional LE strength and performance of transfers Baseline:  Goal status: INITIAL     PLAN:  PT FREQUENCY:1-2x/wk  PT DURATION: 8 weeks  PLANNED INTERVENTIONS: [id number]- PT Re-evaluation, 97750- Physical Performance Testing, 97110-Therapeutic exercises, 97530- Therapeutic activity,  [id number]- Neuromuscular re-education, [phone]- Self Care, [id number]- Manual therapy, [id number]- Gait training, [phone]- Aquatic Therapy, Patient/Family education, Balance training, Stair training, Taping, and Cryotherapy  PLAN FOR NEXT SESSION: calf and HS stretching.  LE strengthening.  Work on [employer].  Sit to stands.  Gait.  Supine static knee extension stretch   Leigh Minerva III PT, DPT 02/17/2024 12:39 PM

## 2024-02-17 ENCOUNTER — Other Ambulatory Visit: Payer: Self-pay

## 2024-03-02 ENCOUNTER — Encounter (HOSPITAL_BASED_OUTPATIENT_CLINIC_OR_DEPARTMENT_OTHER): Payer: Self-pay | Admitting: Physical Therapy

## 2024-03-02 ENCOUNTER — Ambulatory Visit (HOSPITAL_BASED_OUTPATIENT_CLINIC_OR_DEPARTMENT_OTHER): Admitting: Physical Therapy

## 2024-03-02 DIAGNOSIS — M25662 Stiffness of left knee, not elsewhere classified: Secondary | ICD-10-CM

## 2024-03-02 DIAGNOSIS — M25562 Pain in left knee: Secondary | ICD-10-CM

## 2024-03-02 DIAGNOSIS — M6281 Muscle weakness (generalized): Secondary | ICD-10-CM

## 2024-03-02 DIAGNOSIS — R262 Difficulty in walking, not elsewhere classified: Secondary | ICD-10-CM

## 2024-03-02 NOTE — Therapy (Signed)
 " OUTPATIENT PHYSICAL THERAPY LOWER EXTREMITY TREATMENT   Patient Name: Philip [name] MRN: [id number] DOB:[birth year], 78 y.o., male Today's Date: 03/02/2024  END OF SESSION:  PT End of Session - 03/02/24 0829     Visit Number 4    Number of Visits 15    Date for Recertification  03/29/24    Authorization Type MCR    PT Start Time 0805    PT Stop Time 0845    PT Time Calculation (min) 40 min    Activity Tolerance Patient tolerated treatment well    Behavior During Therapy [facility] for tasks assessed/performed            Past Medical History:  Diagnosis Date   Allergy    Anxiety    Arthritis    Atherosclerosis of aorta 03/11/2017   The 10-year ASCVD risk score Verdon DC Jr., et al., 2013) is: 24.6%   Values used to calculate the score:     Age: 38 years     Sex: Male     Is Non-Hispanic African American: No     Diabetic: No     Tobacco smoker: No     Systolic Blood Pressure: 154 mmHg     Is BP treated: No     HDL Cholesterol: 71.1 mg/dL     Total Cholesterol: 212 mg/dL   Cancer (HCC)    PROSTATE   Cataract    Chronic idiopathic constipation 03/11/2017   Depression    Edema leg 11/29/2014   left leg from foot to thigh, increasinly worse over last 4 weeks   Essential hypertension 03/30/2018   Gastroesophageal reflux disease without esophagitis 03/11/2017   Hyperlipidemia LDL goal <100 03/16/2017   The 10-year ASCVD risk score Verdon DC Jr., et al., 2013) is: 30.6%   Values used to calculate the score:     Age: 78 years     Sex: Male     Is Non-Hispanic African American: No     Diabetic: No     Tobacco smoker: No     Systolic Blood Pressure: 148 mmHg     Is BP treated: Yes     HDL Cholesterol: 69.3 mg/dL     Total Cholesterol: 211 mg/dL   Hypertension    Hypoglycemia    Swelling LAST 30 DAYS DR [facility] AWARE   LEFT LEG AND FOOT   Past Surgical History:  Procedure Laterality Date   BOWEL DECOMPRESSION N/A 03/07/2021   Procedure: BOWEL DECOMPRESSION;  Surgeon: Leigh Elspeth SQUIBB,  MD;  Location: [facility];  Service: Gastroenterology;  Laterality: N/A;   BOWEL DECOMPRESSION N/A 06/07/2021   Procedure: BOWEL DECOMPRESSION;  Surgeon: Rollin Dover, MD;  Location: [facility];  Service: Gastroenterology;  Laterality: N/A;   COLON SURGERY     FLEXIBLE SIGMOIDOSCOPY N/A 03/07/2021   Procedure: FLEXIBLE SIGMOIDOSCOPY;  Surgeon: Leigh Elspeth SQUIBB, MD;  Location: [facility];  Service: Gastroenterology;  Laterality: N/A;   FLEXIBLE SIGMOIDOSCOPY N/A 06/07/2021   Procedure: FLEXIBLE SIGMOIDOSCOPY;  Surgeon: Rollin Dover, MD;  Location: [facility];  Service: Gastroenterology;  Laterality: N/A;   GUM SURGERY     TEETH IMPLANTS ALSO   LAPAROSCOPIC SIGMOID COLECTOMY N/A 06/10/2021   Procedure: LAPAROSCOPIC ASSISTED SIGMOID COLECTOMY;  Surgeon: Gladis Cough, MD;  Location: [facility];  Service: General;  Laterality: N/A;   ORCHIECTOMY Bilateral 01/03/2015   Procedure: ORCHIECTOMY;  Surgeon: Ricardo Likens, MD;  Location: [facility];  Service: Urology;  Laterality: Bilateral;   Patient Active Problem List   Diagnosis  Date Noted   Primary osteoarthritis of both knees 12/12/2023   Need for immunization against influenza 12/07/2023   Anemia due to zinc  deficiency 10/08/2022   Iron deficiency anemia 03/07/2021   Tinnitus of both ears 09/07/2019   Laryngopharyngeal reflux (LPR) 09/07/2019   Essential hypertension 03/30/2018   Port-A-Cath in place 08/25/2017   Hyperlipidemia LDL goal <100 03/16/2017   Atherosclerosis of aorta 03/11/2017   Chronic idiopathic constipation 03/11/2017   Seasonal allergic rhinitis due to pollen 03/11/2017   Gastroesophageal reflux disease without esophagitis 03/11/2017   Malignant neoplasm of bone (HCC) 10/16/2015   Prostate cancer (HCC) 02/02/2015    PCP: Joshua Debby CROME, MD   REFERRING PROVIDER: Joshua Debby CROME, MD   REFERRING DIAG: M17.0 (ICD-10-CM) - Primary osteoarthritis of both knees   THERAPY DIAG:  Left knee pain, unspecified  chronicity  Muscle weakness (generalized)  Difficulty in walking, not elsewhere classified  Stiffness of left knee, not elsewhere classified  Stiffness of left knee, not elsewhere classified  Rationale for Evaluation and Treatment: Rehabilitation  ONSET DATE: PT order 12/12/23  SUBJECTIVE:   SUBJECTIVE STATEMENT: Pt has been performing his home exercises.  Pt reports he had a 10/10 pain this AM and now has a 5/10 in L knee.  He used a Tylenol /lidocaine  roll-on this AM.  Pt states he had improved stiffness and pain after prior treatment.   PERTINENT HISTORY: -Prostate CA with metastatic spread to lymph nodes and bone (dx 2016)--Pt is on oral chemotherapy.  Pt is followed by Dr. Federico and gets an annual CT scan.  Pt states his CA is not active.  Port-a-cath located on R side of chest. -possible lung infection -L ankle OA, R knee arthrosis -anemia, anxiety, depression, HTN -Scarring on liver--Pt states for now the liver is ok    PAIN:  L knee:  5/10  Pt denies pain in R knee.  PRECAUTIONS: Other: metastatic CA   WEIGHT BEARING RESTRICTIONS: No  FALLS:  Has patient fallen in last 6 months? No  LIVING ENVIRONMENT: Lives with: lives with their spouse Lives in: 3 story home Stairs: yes   PLOF: Independent  PATIENT GOALS: walk and climb stairs normal, improve strength.  L knee to function as R knee   OBJECTIVE:  Note: Objective measures were completed at Evaluation unless otherwise noted.  DIAGNOSTIC FINDINGS: L Tib/Fib X ray on 12/07/23: FINDINGS: No acute fracture or dislocation. Os trigonum. Moderate tricompartmental osteoarthritis of the knee, most severe in the patellofemoral compartment. Mild osteoarthritis of the ankle. Soft tissues are unremarkable.   IMPRESSION: 1. No acute fracture or dislocation. 2. Osteoarthritis of the knee and ankle, as above.  Knees x ray in 2021: IMPRESSION: 1. No fracture or dislocation of the bilateral knees. 2. There is  bilateral arthrosis of the knees, most significant in the patellofemoral compartments and worse on the right, with relatively preserved femorotibial joint spaces. 3. There are large calcified loose bodies bilaterally, larger and more numerous on the right. 4. Small bilateral knee joint effusions.  CT on 11/14: IMPRESSION: 1. Stable sclerotic metastatic osseous disease without new or progressive findings or pathologic fracture. 2. Patchy tree-in-bud nodularity in the right middle lobe, suggestive of inflammatory process or atypical infection such as nontuberculous mycobacterial infection. 3. Liver morphology suggestive of cirrhosis without focal hepatic lesion. 4. No findings for metastatic disease soft tissue disease involving the chest abdomen or pelvis.  PATIENT SURVEYS:  LEFS:  40/80  COGNITION: Overall cognitive status: Within functional limits for tasks assessed  LOWER EXTREMITY ROM:  AROM Right eval Left eval  Hip flexion    Hip extension    Hip abduction    Hip adduction    Hip internal rotation    Hip external rotation    Knee flexion 134 111  Knee extension 3/0 AROM/PROM 10/7 AROM/PROM in supine; Lacking 20 deg with LAQ  Ankle dorsiflexion    Ankle plantarflexion    Ankle inversion    Ankle eversion     (Blank rows = not tested)  LOWER EXTREMITY MMT:  MMT Right eval Left eval Right 12/9 Left 12/9  Hip flexion 5/5 4+/5 with pain    Hip extension      Hip abduction Dificulty with correct form including keeping knee straight but tolerates good resistance <3/5 29.4 24.4  Hip adduction      Hip internal rotation      Hip external rotation      Knee flexion 5/5 seated 5/5 seated    Knee extension 5/5 4-/5 with pain    Ankle dorsiflexion      Ankle plantarflexion      Ankle inversion      Ankle eversion       (Blank rows = not tested)   FUNCTIONAL TESTS:  5x STS test: 23.3 sec without UE's  GAIT: Assistive device utilized: None Level of  assistance: Complete Independence Comments: Pt has significant genu varus at L knee.  Decreased L knee extension, L toe off, and foot clearance.  Slow gait speed.  3-5/10 pain in L knee with walking in hallway.                                                                                                                                 TREATMENT:    Pt received L knee flexion and extension PROM in supine per pt tolerance. Pt received manual knee extension stretch in supine.  Supine heel slides with strap x 10 Attempted supine SLR though pt unable to perform Supine Quad sets into ball  SAQ x 7 reps--stopped due to pain Standing gastroc stretch 3x20 sec Nustep bilat UE/LE's lvl 4 x 5 mins Sit to stands from elevated table with hands on knees 2x6 reps Seated knee extension stretch with heel on stool x 30 sec     See below for pt education.  PATIENT EDUCATION:  Education details: dx, exercise form, HEP rationale of interventions, relevant anatomy, and POC.  PT answered pt's questions. Person educated: Patient Education method: Explanation, demonstration, verbal and tactile cues, handout Education comprehension: verbalized understanding and needs further education, returned demonstration, verbal and tactile cues required  HOME EXERCISE PROGRAM: Pt has a HEP.   Access Code: [email or web address] URL: [email or web address] Date: 02/09/2024 Prepared by: Mose Minerva  Exercises - Supine Bridge  - 1 x daily - 7 x weekly - 2 sets - 10 reps - Seated Heel Slide  - 2 x daily - 7 x weekly -  2 sets - 10 reps - Seated Hamstring Stretch  - 2 x daily - 7 x weekly - 2 reps - 20 seconds hold  Updated HEP: - Seated Long Arc Quad  - 1 x daily - 7 x weekly - 2 sets - 10 reps - Supine Knee Extension Stretch on Towel Roll  - 2-3 x daily - 7 x weekly - 1 reps - 2 minutes hold  ASSESSMENT:  CLINICAL IMPRESSION: PT worked on improving knee ROM.  He continues to have significant limitations in  knee extension ROM.  PT performed manual knee extension stretch and pt had improved tolerance and improved extension ROM.  Pt performed supine heel slides with strap to improve knee ROM though was limited in extension due to pain.  He is limited with exercises due to pain and extension limitation though gives good effort with all exercises.  Pt unable to perform supine SLR on L.  Pt stopped SAQ due to pain.  Pt only able tolerate 30 sec with seated knee extension stretch.  He responded well to treatment stating his muscle is more relaxed and has less pain with standing up after treatment.  Pt should benefit from skilled PT to address impairments and goals and to improve overall function.     OBJECTIVE IMPAIRMENTS: Abnormal gait, decreased activity tolerance, decreased endurance, decreased mobility, difficulty walking, decreased ROM, decreased strength, hypomobility, impaired flexibility, and pain.   ACTIVITY LIMITATIONS: carrying, bending, standing, squatting, stairs, transfers, and locomotion level  PARTICIPATION LIMITATIONS: cleaning, shopping, and community activity  PERSONAL FACTORS: 3+ comorbidities: metastatic CA, anemia, L ankle OA are also affecting patient's functional outcome.   REHAB POTENTIAL: Good  CLINICAL DECISION MAKING: Evolving/moderate complexity  EVALUATION COMPLEXITY: Moderate   GOALS:  SHORT TERM GOALS: Target date: 03/01/2024  Pt will be independent and compliant with HEP for improved pain, strength, ROM, and function. Baseline: Goal status: INITIAL  2.  Pt will demonstrate improved time on the 5x STS test by at least 5 seconds for improved functional LE strength and performance of transfers.  Baseline:  Goal status: INITIAL  3.  Pt will demonstrate improved L knee AROM by at least 5 degrees in both flexion and extension for improved stiffness, mobility, and gait.  Baseline:  Goal status: INITIAL  4.  Pt will demo at least a 10 deg improvement with seated LAQ  for improved quad strength and knee mobility.  Baseline:  Goal status: INITIAL  5.  Pt will report at least a 25% improvement overall in pain and daily mobility.  Baseline:  Goal status: INITIAL    LONG TERM GOALS: Target date: 03/29/2024  Pt will demo a reciprocal gait on the stairs with the rail and report he is able to perform his stairs at home without difficulty.  Baseline:  Goal status: INITIAL  2.  Pt will report he is able to perform extended community ambulation without significant difficulty and pain.  Baseline:  Goal status: INITIAL  3.  Pt will report at least a 60% improvement overall in his knee pain and daily mobility.  Baseline:  Goal status: INITIAL  4.  Pt will demo improved L hip flexion and knee extension strength to 5/5 MMT for improved performance of and tolerance with functional mobility.  (May add hip abd goal after testing next visit) Baseline:  Goal status: INITIAL  5.  Pt will perform the 5x STS test in < than 16 seconds for improved functional LE strength and performance of transfers Baseline:  Goal status: INITIAL     PLAN:  PT FREQUENCY:1-2x/wk  PT DURATION: 8 weeks  PLANNED INTERVENTIONS: [id number]- PT Re-evaluation, 97750- Physical Performance Testing, 97110-Therapeutic exercises, 97530- Therapeutic activity, [id number]- Neuromuscular re-education, 97535- Self Care, [id number]- Manual therapy, [id number]- Gait training, [id number]- Aquatic Therapy, Patient/Family education, Balance training, Stair training, Taping, and Cryotherapy  PLAN FOR NEXT SESSION: calf and HS stretching.  LE strengthening.  Work on [employer].  Sit to stands.  Gait.  Supine static knee extension stretch   Leigh Minerva III PT, DPT 03/02/2024 8:36 PM      "

## 2024-03-04 ENCOUNTER — Ambulatory Visit (HOSPITAL_BASED_OUTPATIENT_CLINIC_OR_DEPARTMENT_OTHER): Attending: Internal Medicine | Admitting: Physical Therapy

## 2024-03-04 ENCOUNTER — Encounter (HOSPITAL_BASED_OUTPATIENT_CLINIC_OR_DEPARTMENT_OTHER): Payer: Self-pay | Admitting: Physical Therapy

## 2024-03-04 DIAGNOSIS — M25562 Pain in left knee: Secondary | ICD-10-CM | POA: Insufficient documentation

## 2024-03-04 DIAGNOSIS — R262 Difficulty in walking, not elsewhere classified: Secondary | ICD-10-CM | POA: Insufficient documentation

## 2024-03-04 DIAGNOSIS — M25662 Stiffness of left knee, not elsewhere classified: Secondary | ICD-10-CM | POA: Insufficient documentation

## 2024-03-04 DIAGNOSIS — M6281 Muscle weakness (generalized): Secondary | ICD-10-CM | POA: Insufficient documentation

## 2024-03-04 NOTE — Therapy (Signed)
 " OUTPATIENT PHYSICAL THERAPY LOWER EXTREMITY TREATMENT   Patient Name: Philip [name] MRN: [id number] DOB:1945/05/20, 79 y.o., male Today's Date: 03/04/2024  END OF SESSION:  PT End of Session - 03/04/24 0946     Visit Number 5    Number of Visits 15    Date for Recertification  03/29/24    Authorization Type MCR    PT Start Time 9060    PT Stop Time 1022    PT Time Calculation (min) 43 min    Activity Tolerance Patient tolerated treatment well    Behavior During Therapy [facility] for tasks assessed/performed            Past Medical History:  Diagnosis Date   Allergy    Anxiety    Arthritis    Atherosclerosis of aorta 03/11/2017   The 10-year ASCVD risk score Verdon DC Jr., et al., 2013) is: 24.6%   Values used to calculate the score:     Age: 26 years     Sex: Male     Is Non-Hispanic African American: No     Diabetic: No     Tobacco smoker: No     Systolic Blood Pressure: 154 mmHg     Is BP treated: No     HDL Cholesterol: 71.1 mg/dL     Total Cholesterol: 212 mg/dL   Cancer (HCC)    PROSTATE   Cataract    Chronic idiopathic constipation 03/11/2017   Depression    Edema leg 11/29/2014   left leg from foot to thigh, increasinly worse over last 4 weeks   Essential hypertension 03/30/2018   Gastroesophageal reflux disease without esophagitis 03/11/2017   Hyperlipidemia LDL goal <100 03/16/2017   The 10-year ASCVD risk score Verdon DC Jr., et al., 2013) is: 30.6%   Values used to calculate the score:     Age: 59 years     Sex: Male     Is Non-Hispanic African American: No     Diabetic: No     Tobacco smoker: No     Systolic Blood Pressure: 148 mmHg     Is BP treated: Yes     HDL Cholesterol: 69.3 mg/dL     Total Cholesterol: 211 mg/dL   Hypertension    Hypoglycemia    Swelling LAST 30 DAYS DR [facility] AWARE   LEFT LEG AND FOOT   Past Surgical History:  Procedure Laterality Date   BOWEL DECOMPRESSION N/A 03/07/2021   Procedure: BOWEL DECOMPRESSION;  Surgeon: Leigh Elspeth SQUIBB, MD;   Location: [facility];  Service: Gastroenterology;  Laterality: N/A;   BOWEL DECOMPRESSION N/A 06/07/2021   Procedure: BOWEL DECOMPRESSION;  Surgeon: Rollin Dover, MD;  Location: [facility];  Service: Gastroenterology;  Laterality: N/A;   COLON SURGERY     FLEXIBLE SIGMOIDOSCOPY N/A 03/07/2021   Procedure: FLEXIBLE SIGMOIDOSCOPY;  Surgeon: Leigh Elspeth SQUIBB, MD;  Location: [facility];  Service: Gastroenterology;  Laterality: N/A;   FLEXIBLE SIGMOIDOSCOPY N/A 06/07/2021   Procedure: FLEXIBLE SIGMOIDOSCOPY;  Surgeon: Rollin Dover, MD;  Location: [facility];  Service: Gastroenterology;  Laterality: N/A;   GUM SURGERY     TEETH IMPLANTS ALSO   LAPAROSCOPIC SIGMOID COLECTOMY N/A 06/10/2021   Procedure: LAPAROSCOPIC ASSISTED SIGMOID COLECTOMY;  Surgeon: Gladis Cough, MD;  Location: [facility];  Service: General;  Laterality: N/A;   ORCHIECTOMY Bilateral 01/03/2015   Procedure: ORCHIECTOMY;  Surgeon: Ricardo Likens, MD;  Location: [facility];  Service: Urology;  Laterality: Bilateral;   Patient Active Problem List   Diagnosis  Date Noted   Primary osteoarthritis of both knees 12/12/2023   Need for immunization against influenza 12/07/2023   Anemia due to zinc  deficiency 10/08/2022   Iron deficiency anemia 03/07/2021   Tinnitus of both ears 09/07/2019   Laryngopharyngeal reflux (LPR) 09/07/2019   Essential hypertension 03/30/2018   Port-A-Cath in place 08/25/2017   Hyperlipidemia LDL goal <100 03/16/2017   Atherosclerosis of aorta 03/11/2017   Chronic idiopathic constipation 03/11/2017   Seasonal allergic rhinitis due to pollen 03/11/2017   Gastroesophageal reflux disease without esophagitis 03/11/2017   Malignant neoplasm of bone (HCC) 10/16/2015   Prostate cancer (HCC) 02/02/2015    PCP: Joshua Debby CROME, MD   REFERRING PROVIDER: Joshua Debby CROME, MD   REFERRING DIAG: M17.0 (ICD-10-CM) - Primary osteoarthritis of both knees   THERAPY DIAG:  Left knee pain, unspecified  chronicity  Muscle weakness (generalized)  Difficulty in walking, not elsewhere classified  Stiffness of left knee, not elsewhere classified  Stiffness of left knee, not elsewhere classified  Rationale for Evaluation and Treatment: Rehabilitation  ONSET DATE: PT order 12/12/23  SUBJECTIVE:   SUBJECTIVE STATEMENT: Pt has been performing his home exercises.  Pt states he did have some pain later after prior treatment though felt more limber.  He used a Tylenol /lidocaine  roll-on this AM.    PERTINENT HISTORY: -Prostate CA with metastatic spread to lymph nodes and bone (dx 2016)--Pt is on oral chemotherapy.  Pt is followed by Dr. Federico and gets an annual CT scan.  Pt states his CA is not active.  Port-a-cath located on R side of chest. -possible lung infection -L ankle OA, R knee arthrosis -anemia, anxiety, depression, HTN -Scarring on liver--Pt states for now the liver is ok    PAIN:  L knee:  7/10  Pt denies pain in R knee.  PRECAUTIONS: Other: metastatic CA   WEIGHT BEARING RESTRICTIONS: No  FALLS:  Has patient fallen in last 6 months? No  LIVING ENVIRONMENT: Lives with: lives with their spouse Lives in: 3 story home Stairs: yes   PLOF: Independent  PATIENT GOALS: walk and climb stairs normal, improve strength.  L knee to function as R knee   OBJECTIVE:  Note: Objective measures were completed at Evaluation unless otherwise noted.  DIAGNOSTIC FINDINGS: L Tib/Fib X ray on 12/07/23: FINDINGS: No acute fracture or dislocation. Os trigonum. Moderate tricompartmental osteoarthritis of the knee, most severe in the patellofemoral compartment. Mild osteoarthritis of the ankle. Soft tissues are unremarkable.   IMPRESSION: 1. No acute fracture or dislocation. 2. Osteoarthritis of the knee and ankle, as above.  Knees x ray in 2021: IMPRESSION: 1. No fracture or dislocation of the bilateral knees. 2. There is bilateral arthrosis of the knees, most significant  in the patellofemoral compartments and worse on the right, with relatively preserved femorotibial joint spaces. 3. There are large calcified loose bodies bilaterally, larger and more numerous on the right. 4. Small bilateral knee joint effusions.  CT on 11/14: IMPRESSION: 1. Stable sclerotic metastatic osseous disease without new or progressive findings or pathologic fracture. 2. Patchy tree-in-bud nodularity in the right middle lobe, suggestive of inflammatory process or atypical infection such as nontuberculous mycobacterial infection. 3. Liver morphology suggestive of cirrhosis without focal hepatic lesion. 4. No findings for metastatic disease soft tissue disease involving the chest abdomen or pelvis.  PATIENT SURVEYS:  LEFS:  40/80  COGNITION: Overall cognitive status: Within functional limits for tasks assessed        LOWER EXTREMITY ROM:  AROM Right  eval Left eval  Hip flexion    Hip extension    Hip abduction    Hip adduction    Hip internal rotation    Hip external rotation    Knee flexion 134 111  Knee extension 3/0 AROM/PROM 10/7 AROM/PROM in supine; Lacking 20 deg with LAQ  Ankle dorsiflexion    Ankle plantarflexion    Ankle inversion    Ankle eversion     (Blank rows = not tested)  LOWER EXTREMITY MMT:  MMT Right eval Left eval Right 12/9 Left 12/9  Hip flexion 5/5 4+/5 with pain    Hip extension      Hip abduction Dificulty with correct form including keeping knee straight but tolerates good resistance <3/5 29.4 24.4  Hip adduction      Hip internal rotation      Hip external rotation      Knee flexion 5/5 seated 5/5 seated    Knee extension 5/5 4-/5 with pain    Ankle dorsiflexion      Ankle plantarflexion      Ankle inversion      Ankle eversion       (Blank rows = not tested)   FUNCTIONAL TESTS:  5x STS test: 23.3 sec without UE's  GAIT: Assistive device utilized: None Level of assistance: Complete Independence Comments: Pt  has significant genu varus at L knee.  Decreased L knee extension, L toe off, and foot clearance.  Slow gait speed.  3-5/10 pain in L knee with walking in hallway.                                                                                                                                 TREATMENT:    Nustep bilat UE/LE's lvl 4 x 6 mins Pt received L knee flexion and extension PROM in supine per pt tolerance. Supine heel slides with strap 2x10 Supine static knee extension stretch x 1 min 6 sec and 32 sec Supine Quad sets into ball x10 with 5 sec hold, x 5 reps gently without hold Seated hip abd with RTB 2x10 Sit to stands from elevated table with hands on knees 2x5 reps Standing gastroc stretch 2x20 sec Standing marching x 8 reps Standing hip abd 2x5 just L LE  PT updated HEP and gave pt a HEP handout.  PT educated pt in correct form and appropriate frequency.      See below for pt education.  PATIENT EDUCATION:  Education details: dx, exercise form, HEP, rationale of interventions, relevant anatomy, and POC.  PT answered pt's questions. Person educated: Patient Education method: Explanation, demonstration, verbal and tactile cues, handout Education comprehension: verbalized understanding and needs further education, returned demonstration, verbal and tactile cues required  HOME EXERCISE PROGRAM: Pt has a HEP.   Access Code: [email or web address] URL: [email or web address] Date: 02/09/2024 Prepared by: Mose Minerva  Exercises - Supine Bridge  - 1 x daily - 7 x weekly -  2 sets - 10 reps - Seated Heel Slide  - 2 x daily - 7 x weekly - 2 sets - 10 reps - Seated Hamstring Stretch  - 2 x daily - 7 x weekly - 2 reps - 20 seconds hold - Seated Long Arc Quad  - 1 x daily - 7 x weekly - 2 sets - 10 reps - Supine Knee Extension Stretch on Towel Roll  - 2-3 x daily - 7 x weekly - 1 reps - 2 minutes hold  Updated HEP: - Supine Heel Slide with Strap  - 2 x daily - 7 x weekly - 1  sets - 10 reps - Seated Hip Abduction with Resistance  - 1 x daily - 4-5 x weekly - 2 sets - 10 reps   ASSESSMENT:  CLINICAL IMPRESSION: Pt continues to be limited in extension ROM and is painful with extension ROM.  He has improved tolerance and ROM with knee PROM.  PT had pt perform supine heel slides with strap and instructed pt in correct form and appropriate range.  PT instructed pt to not perform extension ROM into a painful range though to work on gently improving extension ROM.  Pt performed exercises well with cuing and instruction in correct form.  PT slowly increased closed chain exercises today and pt had increased pain with marching.  He requires the table to be elevated for sit to stands to improve performance and to increase work in [employer] as well as decreasing assistance of Ue's.  PT updated HEP and educated pt in correct form and appropriate frequency. He demonstrates good understanding of HEP.  Pt states he feels a lot better than when he came in.  He reports improved pain from 7/10 at beginning of treatment to 2/10 after treatment.   Pt should benefit from continued skilled PT to address impairments and goals and to improve overall function.  OBJECTIVE IMPAIRMENTS: Abnormal gait, decreased activity tolerance, decreased endurance, decreased mobility, difficulty walking, decreased ROM, decreased strength, hypomobility, impaired flexibility, and pain.   ACTIVITY LIMITATIONS: carrying, bending, standing, squatting, stairs, transfers, and locomotion level  PARTICIPATION LIMITATIONS: cleaning, shopping, and community activity  PERSONAL FACTORS: 3+ comorbidities: metastatic CA, anemia, L ankle OA are also affecting patient's functional outcome.   REHAB POTENTIAL: Good  CLINICAL DECISION MAKING: Evolving/moderate complexity  EVALUATION COMPLEXITY: Moderate   GOALS:  SHORT TERM GOALS: Target date: 03/01/2024  Pt will be independent and compliant with HEP for improved pain,  strength, ROM, and function. Baseline: Goal status: INITIAL  2.  Pt will demonstrate improved time on the 5x STS test by at least 5 seconds for improved functional LE strength and performance of transfers.  Baseline:  Goal status: INITIAL  3.  Pt will demonstrate improved L knee AROM by at least 5 degrees in both flexion and extension for improved stiffness, mobility, and gait.  Baseline:  Goal status: INITIAL  4.  Pt will demo at least a 10 deg improvement with seated LAQ for improved quad strength and knee mobility.  Baseline:  Goal status: INITIAL  5.  Pt will report at least a 25% improvement overall in pain and daily mobility.  Baseline:  Goal status: INITIAL    LONG TERM GOALS: Target date: 03/29/2024  Pt will demo a reciprocal gait on the stairs with the rail and report he is able to perform his stairs at home without difficulty.  Baseline:  Goal status: INITIAL  2.  Pt will report he is able to perform  extended community ambulation without significant difficulty and pain.  Baseline:  Goal status: INITIAL  3.  Pt will report at least a 60% improvement overall in his knee pain and daily mobility.  Baseline:  Goal status: INITIAL  4.  Pt will demo improved L hip flexion and knee extension strength to 5/5 MMT for improved performance of and tolerance with functional mobility.  (May add hip abd goal after testing next visit) Baseline:  Goal status: INITIAL  5.  Pt will perform the 5x STS test in < than 16 seconds for improved functional LE strength and performance of transfers Baseline:  Goal status: INITIAL     PLAN:  PT FREQUENCY:1-2x/wk  PT DURATION: 8 weeks  PLANNED INTERVENTIONS: 97164- PT Re-evaluation, 97750- Physical Performance Testing, 97110-Therapeutic exercises, 97530- Therapeutic activity, [id number]- Neuromuscular re-education, 97535- Self Care, [id number]- Manual therapy, [id number]- Gait training, [phone]- Aquatic Therapy, Patient/Family education, Balance  training, Stair training, Taping, and Cryotherapy  PLAN FOR NEXT SESSION: calf and HS stretching.  LE strengthening.  Work on [employer].  Sit to stands.  Gait.  Supine static knee extension stretch   Leigh Minerva III PT, DPT 03/04/2024 10:34 AM       "

## 2024-03-08 ENCOUNTER — Encounter: Payer: Self-pay | Admitting: Hematology and Oncology

## 2024-03-08 ENCOUNTER — Telehealth: Payer: Self-pay

## 2024-03-08 ENCOUNTER — Other Ambulatory Visit (HOSPITAL_COMMUNITY): Payer: Self-pay

## 2024-03-08 ENCOUNTER — Encounter (HOSPITAL_BASED_OUTPATIENT_CLINIC_OR_DEPARTMENT_OTHER): Payer: Self-pay | Admitting: Physical Therapy

## 2024-03-08 ENCOUNTER — Ambulatory Visit (HOSPITAL_BASED_OUTPATIENT_CLINIC_OR_DEPARTMENT_OTHER): Admitting: Physical Therapy

## 2024-03-08 DIAGNOSIS — R262 Difficulty in walking, not elsewhere classified: Secondary | ICD-10-CM

## 2024-03-08 DIAGNOSIS — M25662 Stiffness of left knee, not elsewhere classified: Secondary | ICD-10-CM

## 2024-03-08 DIAGNOSIS — M25562 Pain in left knee: Secondary | ICD-10-CM

## 2024-03-08 DIAGNOSIS — M6281 Muscle weakness (generalized): Secondary | ICD-10-CM

## 2024-03-08 NOTE — Therapy (Signed)
 " OUTPATIENT PHYSICAL THERAPY LOWER EXTREMITY TREATMENT   Patient Name: Philip [name] MRN: [id number] DOB:09-05-1945, 79 y.o., male Today's Date: 03/09/2024  END OF SESSION:  PT End of Session - 03/08/24 1541     Visit Number 6    Number of Visits 15    Date for Recertification  03/29/24    Authorization Type MCR    PT Start Time 1536    PT Stop Time 1619    PT Time Calculation (min) 43 min    Activity Tolerance Patient tolerated treatment well    Behavior During Therapy [facility] for tasks assessed/performed             Past Medical History:  Diagnosis Date   Allergy    Anxiety    Arthritis    Atherosclerosis of aorta 03/11/2017   The 10-year ASCVD risk score Verdon DC Jr., et al., 2013) is: 24.6%   Values used to calculate the score:     Age: 43 years     Sex: Male     Is Non-Hispanic African American: No     Diabetic: No     Tobacco smoker: No     Systolic Blood Pressure: 154 mmHg     Is BP treated: No     HDL Cholesterol: 71.1 mg/dL     Total Cholesterol: 212 mg/dL   Cancer (HCC)    PROSTATE   Cataract    Chronic idiopathic constipation 03/11/2017   Depression    Edema leg 11/29/2014   left leg from foot to thigh, increasinly worse over last 4 weeks   Essential hypertension 03/30/2018   Gastroesophageal reflux disease without esophagitis 03/11/2017   Hyperlipidemia LDL goal <100 03/16/2017   The 10-year ASCVD risk score Verdon DC Jr., et al., 2013) is: 30.6%   Values used to calculate the score:     Age: 59 years     Sex: Male     Is Non-Hispanic African American: No     Diabetic: No     Tobacco smoker: No     Systolic Blood Pressure: 148 mmHg     Is BP treated: Yes     HDL Cholesterol: 69.3 mg/dL     Total Cholesterol: 211 mg/dL   Hypertension    Hypoglycemia    Swelling LAST 30 DAYS DR [facility] AWARE   LEFT LEG AND FOOT   Past Surgical History:  Procedure Laterality Date   BOWEL DECOMPRESSION N/A 03/07/2021   Procedure: BOWEL DECOMPRESSION;  Surgeon: Leigh Elspeth SQUIBB,  MD;  Location: [facility];  Service: Gastroenterology;  Laterality: N/A;   BOWEL DECOMPRESSION N/A 06/07/2021   Procedure: BOWEL DECOMPRESSION;  Surgeon: Rollin Dover, MD;  Location: [facility];  Service: Gastroenterology;  Laterality: N/A;   COLON SURGERY     FLEXIBLE SIGMOIDOSCOPY N/A 03/07/2021   Procedure: FLEXIBLE SIGMOIDOSCOPY;  Surgeon: Leigh Elspeth SQUIBB, MD;  Location: [facility];  Service: Gastroenterology;  Laterality: N/A;   FLEXIBLE SIGMOIDOSCOPY N/A 06/07/2021   Procedure: FLEXIBLE SIGMOIDOSCOPY;  Surgeon: Rollin Dover, MD;  Location: [facility];  Service: Gastroenterology;  Laterality: N/A;   GUM SURGERY     TEETH IMPLANTS ALSO   LAPAROSCOPIC SIGMOID COLECTOMY N/A 06/10/2021   Procedure: LAPAROSCOPIC ASSISTED SIGMOID COLECTOMY;  Surgeon: Gladis Cough, MD;  Location: [facility];  Service: General;  Laterality: N/A;   ORCHIECTOMY Bilateral 01/03/2015   Procedure: ORCHIECTOMY;  Surgeon: Ricardo Likens, MD;  Location: [facility];  Service: Urology;  Laterality: Bilateral;   Patient Active Problem List  Diagnosis Date Noted   Primary osteoarthritis of both knees 12/12/2023   Need for immunization against influenza 12/07/2023   Anemia due to zinc  deficiency 10/08/2022   Iron deficiency anemia 03/07/2021   Tinnitus of both ears 09/07/2019   Laryngopharyngeal reflux (LPR) 09/07/2019   Essential hypertension 03/30/2018   Port-A-Cath in place 08/25/2017   Hyperlipidemia LDL goal <100 03/16/2017   Atherosclerosis of aorta 03/11/2017   Chronic idiopathic constipation 03/11/2017   Seasonal allergic rhinitis due to pollen 03/11/2017   Gastroesophageal reflux disease without esophagitis 03/11/2017   Malignant neoplasm of bone (HCC) 10/16/2015   Prostate cancer (HCC) 02/02/2015    PCP: Joshua Debby CROME, MD   REFERRING PROVIDER: Joshua Debby CROME, MD   REFERRING DIAG: M17.0 (ICD-10-CM) - Primary osteoarthritis of both knees   THERAPY DIAG:  Left knee pain, unspecified  chronicity  Muscle weakness (generalized)  Difficulty in walking, not elsewhere classified  Stiffness of left knee, not elsewhere classified  Stiffness of left knee, not elsewhere classified  Rationale for Evaluation and Treatment: Rehabilitation  ONSET DATE: PT order 12/12/23  SUBJECTIVE:   SUBJECTIVE STATEMENT: Pt has been performing his home exercises.  Pt states he has a CT scan for his lung on 1/16.  Pt states the exercises are getting easier overall.  He thinks the extension is about the same.  He has a lot of pain at night.  He has stiffness and pain when trying to walk after sitting awhile.    PERTINENT HISTORY: -Prostate CA with metastatic spread to lymph nodes and bone (dx 2016)--Pt is on oral chemotherapy.  Pt is followed by Dr. Federico and gets an annual CT scan.  Pt states his CA is not active.  Port-a-cath located on R side of chest. -possible lung infection -L ankle OA, R knee arthrosis -anemia, anxiety, depression, HTN -Scarring on liver--Pt states for now the liver is ok    PAIN:  L knee:  5/10  Pt denies pain in R knee.  PRECAUTIONS: Other: metastatic CA   WEIGHT BEARING RESTRICTIONS: No  FALLS:  Has patient fallen in last 6 months? No  LIVING ENVIRONMENT: Lives with: lives with their spouse Lives in: 3 story home Stairs: yes   PLOF: Independent  PATIENT GOALS: walk and climb stairs normal, improve strength.  L knee to function as R knee   OBJECTIVE:  Note: Objective measures were completed at Evaluation unless otherwise noted.  DIAGNOSTIC FINDINGS: L Tib/Fib X ray on 12/07/23: FINDINGS: No acute fracture or dislocation. Os trigonum. Moderate tricompartmental osteoarthritis of the knee, most severe in the patellofemoral compartment. Mild osteoarthritis of the ankle. Soft tissues are unremarkable.   IMPRESSION: 1. No acute fracture or dislocation. 2. Osteoarthritis of the knee and ankle, as above.  Knees x ray in 2021: IMPRESSION: 1.  No fracture or dislocation of the bilateral knees. 2. There is bilateral arthrosis of the knees, most significant in the patellofemoral compartments and worse on the right, with relatively preserved femorotibial joint spaces. 3. There are large calcified loose bodies bilaterally, larger and more numerous on the right. 4. Small bilateral knee joint effusions.  CT on 11/14: IMPRESSION: 1. Stable sclerotic metastatic osseous disease without new or progressive findings or pathologic fracture. 2. Patchy tree-in-bud nodularity in the right middle lobe, suggestive of inflammatory process or atypical infection such as nontuberculous mycobacterial infection. 3. Liver morphology suggestive of cirrhosis without focal hepatic lesion. 4. No findings for metastatic disease soft tissue disease involving the chest abdomen or pelvis.  PATIENT SURVEYS:  LEFS:  40/80  COGNITION: Overall cognitive status: Within functional limits for tasks assessed        LOWER EXTREMITY ROM:  AROM Right eval Left eval  Hip flexion    Hip extension    Hip abduction    Hip adduction    Hip internal rotation    Hip external rotation    Knee flexion 134 111  Knee extension 3/0 AROM/PROM 10/7 AROM/PROM in supine; Lacking 20 deg with LAQ  Ankle dorsiflexion    Ankle plantarflexion    Ankle inversion    Ankle eversion     (Blank rows = not tested)  LOWER EXTREMITY MMT:  MMT Right eval Left eval Right 12/9 Left 12/9  Hip flexion 5/5 4+/5 with pain    Hip extension      Hip abduction Dificulty with correct form including keeping knee straight but tolerates good resistance <3/5 29.4 24.4  Hip adduction      Hip internal rotation      Hip external rotation      Knee flexion 5/5 seated 5/5 seated    Knee extension 5/5 4-/5 with pain    Ankle dorsiflexion      Ankle plantarflexion      Ankle inversion      Ankle eversion       (Blank rows = not tested)   FUNCTIONAL TESTS:  5x STS test: 23.3 sec  without UE's  GAIT: Assistive device utilized: None Level of assistance: Complete Independence Comments: Pt has significant genu varus at L knee.  Decreased L knee extension, L toe off, and foot clearance.  Slow gait speed.  3-5/10 pain in L knee with walking in hallway.                                                                                                                                 TREATMENT:    Nustep bilat UE/LE's lvl 4 x 6 mins Seated knee extension static stretch x 2 mins with stool on lower leg and 1 min 30 sec with stool at heel Pt received L knee flexion and extension PROM in supine per pt tolerance. Longsitting gastroc stretch with heel hanging off edge of table 2x10-15 sec PT attempted supine manual HS stretch though pt had pain. Sit to stands from elevated table with hands on knees x6 and 5 reps Seated hip abd with RTB 2x10 TKE with YTB x 10, 6 reps Standing gastroc stretch 2x20 sec Standing marching x 8, 4 reps Standing hip abd 2x5 just L LE Standing weight shifts in staggered stance with L LE fwd   PATIENT EDUCATION:  Education details: dx, exercise form, HEP, rationale of interventions, relevant anatomy, and POC.  PT answered pt's questions. Person educated: Patient Education method: Explanation, demonstration, verbal and tactile cues, handout Education comprehension: verbalized understanding and needs further education, returned demonstration, verbal and tactile cues required  HOME EXERCISE PROGRAM: Pt  has a HEP.   Access Code: [email or web address] URL: [email or web address] Date: 02/09/2024 Prepared by: Mose Minerva  Exercises - Supine Bridge  - 1 x daily - 7 x weekly - 2 sets - 10 reps - Seated Heel Slide  - 2 x daily - 7 x weekly - 2 sets - 10 reps - Seated Hamstring Stretch  - 2 x daily - 7 x weekly - 2 reps - 20 seconds hold - Seated Long Arc Quad  - 1 x daily - 7 x weekly - 2 sets - 10 reps - Supine Knee Extension Stretch on Towel Roll   - 2-3 x daily - 7 x weekly - 1 reps - 2 minutes hold  Updated HEP: - Supine Heel Slide with Strap  - 2 x daily - 7 x weekly - 1 sets - 10 reps - Seated Hip Abduction with Resistance  - 1 x daily - 4-5 x weekly - 2 sets - 10 reps   ASSESSMENT:  CLINICAL IMPRESSION: Pt continues to have stiffness, pain, and difficulty with initially walking after standing up from a seated position.  He has pain with extension ROM and continues to be limited with extension.  He had much improved tolerance with seated knee extension stretch today.  PT attempted supine manual HS stretch though PT unable to achieve a good stretch due to pain.  Pt has increased pain with marching.  He requires the table to be elevated for sit to stands to improve performance and to increase work in [employer] as well as decreasing assistance of Ue's.  He did report pain toward the end of the 2nd set of sit/stands.  He gives good effort with all exercises.  Pt states pain is somewhat better and he feels more loose after treatment, but pain still lingered.  Pt should benefit from continued skilled PT to address impairments and goals and to improve overall function.    OBJECTIVE IMPAIRMENTS: Abnormal gait, decreased activity tolerance, decreased endurance, decreased mobility, difficulty walking, decreased ROM, decreased strength, hypomobility, impaired flexibility, and pain.   ACTIVITY LIMITATIONS: carrying, bending, standing, squatting, stairs, transfers, and locomotion level  PARTICIPATION LIMITATIONS: cleaning, shopping, and community activity  PERSONAL FACTORS: 3+ comorbidities: metastatic CA, anemia, L ankle OA are also affecting patient's functional outcome.   REHAB POTENTIAL: Good  CLINICAL DECISION MAKING: Evolving/moderate complexity  EVALUATION COMPLEXITY: Moderate   GOALS:  SHORT TERM GOALS: Target date: 03/01/2024  Pt will be independent and compliant with HEP for improved pain, strength, ROM, and  function. Baseline: Goal status: INITIAL  2.  Pt will demonstrate improved time on the 5x STS test by at least 5 seconds for improved functional LE strength and performance of transfers.  Baseline:  Goal status: INITIAL  3.  Pt will demonstrate improved L knee AROM by at least 5 degrees in both flexion and extension for improved stiffness, mobility, and gait.  Baseline:  Goal status: INITIAL  4.  Pt will demo at least a 10 deg improvement with seated LAQ for improved quad strength and knee mobility.  Baseline:  Goal status: INITIAL  5.  Pt will report at least a 25% improvement overall in pain and daily mobility.  Baseline:  Goal status: INITIAL    LONG TERM GOALS: Target date: 03/29/2024  Pt will demo a reciprocal gait on the stairs with the rail and report he is able to perform his stairs at home without difficulty.  Baseline:  Goal status: INITIAL  2.  Pt will report he is  able to perform extended community ambulation without significant difficulty and pain.  Baseline:  Goal status: INITIAL  3.  Pt will report at least a 60% improvement overall in his knee pain and daily mobility.  Baseline:  Goal status: INITIAL  4.  Pt will demo improved L hip flexion and knee extension strength to 5/5 MMT for improved performance of and tolerance with functional mobility.  (May add hip abd goal after testing next visit) Baseline:  Goal status: INITIAL  5.  Pt will perform the 5x STS test in < than 16 seconds for improved functional LE strength and performance of transfers Baseline:  Goal status: INITIAL     PLAN:  PT FREQUENCY:1-2x/wk  PT DURATION: 8 weeks  PLANNED INTERVENTIONS: 97164- PT Re-evaluation, 97750- Physical Performance Testing, 97110-Therapeutic exercises, 97530- Therapeutic activity, [id number]- Neuromuscular re-education, 97535- Self Care, [id number]- Manual therapy, [id number]- Gait training, [phone]- Aquatic Therapy, Patient/Family education, Balance training, Stair training,  Taping, and Cryotherapy  PLAN FOR NEXT SESSION: calf and HS stretching.  LE strengthening.  Work on [employer].  Sit to stands.  Gait.  Supine static knee extension stretch   Leigh Minerva III PT, DPT 03/09/2024 5:41 PM        "

## 2024-03-08 NOTE — Telephone Encounter (Signed)
 Spoke with pt about his request for date of ordered CT lung scan 02/01/24. SABRA  Confirmed CT scan ordered.  Per chart schedule, not expected until 04/06/24. Pt to call [facility] [phone] today to get the appointment on his calendar.

## 2024-03-08 NOTE — Telephone Encounter (Signed)
 Oral Oncology Patient Advocate Encounter  Was successful in securing patient a $6000 grant from [facility] to provide copayment coverage for xtandi .  This will keep the out of pocket expense at $0.     [employer] ID: [id number]   The billing information is as follows and has been shared with [facility].    RxBin: [id number] PCN: [id number] Member ID: [id number] Group ID: [id number] Dates of Eligibility: 02/07/24 through 02/05/25  Fund:  prostate  Lucie Lamer, CPhT [place]  [facility] Patient Advocate Specialist [address] [place] Phone: [phone]  Fax: [phone] [email or web address]

## 2024-03-10 ENCOUNTER — Other Ambulatory Visit: Payer: Self-pay

## 2024-03-10 ENCOUNTER — Encounter (HOSPITAL_BASED_OUTPATIENT_CLINIC_OR_DEPARTMENT_OTHER): Payer: Self-pay | Admitting: Physical Therapy

## 2024-03-10 ENCOUNTER — Inpatient Hospital Stay

## 2024-03-10 ENCOUNTER — Ambulatory Visit (HOSPITAL_BASED_OUTPATIENT_CLINIC_OR_DEPARTMENT_OTHER): Admitting: Physical Therapy

## 2024-03-10 ENCOUNTER — Other Ambulatory Visit: Payer: Self-pay | Admitting: Hematology and Oncology

## 2024-03-10 DIAGNOSIS — C779 Secondary and unspecified malignant neoplasm of lymph node, unspecified: Secondary | ICD-10-CM | POA: Insufficient documentation

## 2024-03-10 DIAGNOSIS — C7951 Secondary malignant neoplasm of bone: Secondary | ICD-10-CM | POA: Insufficient documentation

## 2024-03-10 DIAGNOSIS — C61 Malignant neoplasm of prostate: Secondary | ICD-10-CM | POA: Insufficient documentation

## 2024-03-10 DIAGNOSIS — M25562 Pain in left knee: Secondary | ICD-10-CM

## 2024-03-10 DIAGNOSIS — M6281 Muscle weakness (generalized): Secondary | ICD-10-CM

## 2024-03-10 DIAGNOSIS — K7460 Unspecified cirrhosis of liver: Secondary | ICD-10-CM | POA: Insufficient documentation

## 2024-03-10 DIAGNOSIS — M25662 Stiffness of left knee, not elsewhere classified: Secondary | ICD-10-CM

## 2024-03-10 DIAGNOSIS — R262 Difficulty in walking, not elsewhere classified: Secondary | ICD-10-CM

## 2024-03-10 DIAGNOSIS — Z9079 Acquired absence of other genital organ(s): Secondary | ICD-10-CM | POA: Insufficient documentation

## 2024-03-10 LAB — CBC WITH DIFFERENTIAL (CANCER CENTER ONLY)
Abs Immature Granulocytes: 0.01 K/uL (ref 0.00–0.07)
Basophils Absolute: 0 K/uL (ref 0.0–0.1)
Basophils Relative: 0 %
Eosinophils Absolute: 0.1 K/uL (ref 0.0–0.5)
Eosinophils Relative: 1 %
HCT: 35.4 % — ABNORMAL LOW (ref 39.0–52.0)
Hemoglobin: 12.1 g/dL — ABNORMAL LOW (ref 13.0–17.0)
Immature Granulocytes: 0 %
Lymphocytes Relative: 30 %
Lymphs Abs: 1.8 K/uL (ref 0.7–4.0)
MCH: 32.6 pg (ref 26.0–34.0)
MCHC: 34.2 g/dL (ref 30.0–36.0)
MCV: 95.4 fL (ref 80.0–100.0)
Monocytes Absolute: 0.6 K/uL (ref 0.1–1.0)
Monocytes Relative: 11 %
Neutro Abs: 3.5 K/uL (ref 1.7–7.7)
Neutrophils Relative %: 58 %
Platelet Count: 234 K/uL (ref 150–400)
RBC: 3.71 MIL/uL — ABNORMAL LOW (ref 4.22–5.81)
RDW: 13.1 % (ref 11.5–15.5)
WBC Count: 6.1 K/uL (ref 4.0–10.5)
nRBC: 0 % (ref 0.0–0.2)

## 2024-03-10 LAB — CMP (CANCER CENTER ONLY)
ALT: 13 U/L (ref 0–44)
AST: 19 U/L (ref 15–41)
Albumin: 4.4 g/dL (ref 3.5–5.0)
Alkaline Phosphatase: 58 U/L (ref 38–126)
Anion gap: 11 (ref 5–15)
BUN: 22 mg/dL (ref 8–23)
CO2: 24 mmol/L (ref 22–32)
Calcium: 9.6 mg/dL (ref 8.9–10.3)
Chloride: 101 mmol/L (ref 98–111)
Creatinine: 1.24 mg/dL (ref 0.61–1.24)
GFR, Estimated: 59 mL/min — ABNORMAL LOW
Glucose, Bld: 104 mg/dL — ABNORMAL HIGH (ref 70–99)
Potassium: 4.5 mmol/L (ref 3.5–5.1)
Sodium: 136 mmol/L (ref 135–145)
Total Bilirubin: 0.3 mg/dL (ref 0.0–1.2)
Total Protein: 6.9 g/dL (ref 6.5–8.1)

## 2024-03-10 LAB — PSA: Prostatic Specific Antigen: <0.02 ng/mL (ref 0.00–4.00)

## 2024-03-10 NOTE — Therapy (Signed)
 " OUTPATIENT PHYSICAL THERAPY LOWER EXTREMITY TREATMENT   Patient Name: Philip [name] MRN: [id number] DOB:1945-08-04, 79 y.o., male Today's Date: 03/11/2024  END OF SESSION:  PT End of Session - 03/10/24 1116     Visit Number 7    Number of Visits 15    Date for Recertification  03/29/24    Authorization Type MCR    PT Start Time 1027    PT Stop Time 1109    PT Time Calculation (min) 42 min    Activity Tolerance Patient tolerated treatment well    Behavior During Therapy [facility] for tasks assessed/performed              Past Medical History:  Diagnosis Date   Allergy    Anxiety    Arthritis    Atherosclerosis of aorta 03/11/2017   The 10-year ASCVD risk score Verdon DC Jr., et al., 2013) is: 24.6%   Values used to calculate the score:     Age: 55 years     Sex: Male     Is Non-Hispanic African American: No     Diabetic: No     Tobacco smoker: No     Systolic Blood Pressure: 154 mmHg     Is BP treated: No     HDL Cholesterol: 71.1 mg/dL     Total Cholesterol: 212 mg/dL   Cancer (HCC)    PROSTATE   Cataract    Chronic idiopathic constipation 03/11/2017   Depression    Edema leg 11/29/2014   left leg from foot to thigh, increasinly worse over last 4 weeks   Essential hypertension 03/30/2018   Gastroesophageal reflux disease without esophagitis 03/11/2017   Hyperlipidemia LDL goal <100 03/16/2017   The 10-year ASCVD risk score Verdon DC Jr., et al., 2013) is: 30.6%   Values used to calculate the score:     Age: 1 years     Sex: Male     Is Non-Hispanic African American: No     Diabetic: No     Tobacco smoker: No     Systolic Blood Pressure: 148 mmHg     Is BP treated: Yes     HDL Cholesterol: 69.3 mg/dL     Total Cholesterol: 211 mg/dL   Hypertension    Hypoglycemia    Swelling LAST 30 DAYS DR [facility] AWARE   LEFT LEG AND FOOT   Past Surgical History:  Procedure Laterality Date   BOWEL DECOMPRESSION N/A 03/07/2021   Procedure: BOWEL DECOMPRESSION;  Surgeon: Leigh Elspeth SQUIBB,  MD;  Location: [facility];  Service: Gastroenterology;  Laterality: N/A;   BOWEL DECOMPRESSION N/A 06/07/2021   Procedure: BOWEL DECOMPRESSION;  Surgeon: Rollin Dover, MD;  Location: [facility];  Service: Gastroenterology;  Laterality: N/A;   COLON SURGERY     FLEXIBLE SIGMOIDOSCOPY N/A 03/07/2021   Procedure: FLEXIBLE SIGMOIDOSCOPY;  Surgeon: Leigh Elspeth SQUIBB, MD;  Location: [facility];  Service: Gastroenterology;  Laterality: N/A;   FLEXIBLE SIGMOIDOSCOPY N/A 06/07/2021   Procedure: FLEXIBLE SIGMOIDOSCOPY;  Surgeon: Rollin Dover, MD;  Location: [facility];  Service: Gastroenterology;  Laterality: N/A;   GUM SURGERY     TEETH IMPLANTS ALSO   LAPAROSCOPIC SIGMOID COLECTOMY N/A 06/10/2021   Procedure: LAPAROSCOPIC ASSISTED SIGMOID COLECTOMY;  Surgeon: Gladis Cough, MD;  Location: [facility];  Service: General;  Laterality: N/A;   ORCHIECTOMY Bilateral 01/03/2015   Procedure: ORCHIECTOMY;  Surgeon: Ricardo Likens, MD;  Location: [facility];  Service: Urology;  Laterality: Bilateral;   Patient Active Problem List  Diagnosis Date Noted   Primary osteoarthritis of both knees 12/12/2023   Need for immunization against influenza 12/07/2023   Anemia due to zinc  deficiency 10/08/2022   Iron deficiency anemia 03/07/2021   Tinnitus of both ears 09/07/2019   Laryngopharyngeal reflux (LPR) 09/07/2019   Essential hypertension 03/30/2018   Port-A-Cath in place 08/25/2017   Hyperlipidemia LDL goal <100 03/16/2017   Atherosclerosis of aorta 03/11/2017   Chronic idiopathic constipation 03/11/2017   Seasonal allergic rhinitis due to pollen 03/11/2017   Gastroesophageal reflux disease without esophagitis 03/11/2017   Malignant neoplasm of bone (HCC) 10/16/2015   Prostate cancer (HCC) 02/02/2015    PCP: Joshua Debby CROME, MD   REFERRING PROVIDER: Joshua Debby CROME, MD   REFERRING DIAG: M17.0 (ICD-10-CM) - Primary osteoarthritis of both knees   THERAPY DIAG:  Left knee pain, unspecified  chronicity  Muscle weakness (generalized)  Difficulty in walking, not elsewhere classified  Stiffness of left knee, not elsewhere classified  Stiffness of left knee, not elsewhere classified  Rationale for Evaluation and Treatment: Rehabilitation  ONSET DATE: PT order 12/12/23  SUBJECTIVE:   SUBJECTIVE STATEMENT: Pt states he is improving though is not where he wants to be yet.  Pt reports a 30% improvement in pain and sx's overall and a 10% improvement in daily mobility  Pt states he is able to do more.  The exercises are helping.  Pt states he is hurting more this AM.  Pt has difficulty sleeping due to knee pain and difficulty getting comfortable.  He has been performing his home exercises.  Pt states he has a CT scan for his lung on 1/16.  He has stiffness and pain when trying to walk after sitting awhile.     PERTINENT HISTORY: -Prostate CA with metastatic spread to lymph nodes and bone (dx 2016)--Pt is on oral chemotherapy.  Pt is followed by Dr. Federico and gets an annual CT scan.  Pt states his CA is not active.  Port-a-cath located on R side of chest. -possible lung infection -L ankle OA, R knee arthrosis -anemia, anxiety, depression, HTN -Scarring on liver--Pt states for now the liver is ok    PAIN:  L knee:  6-7/10  Pt denies pain in R knee.  PRECAUTIONS: Other: metastatic CA   WEIGHT BEARING RESTRICTIONS: No  FALLS:  Has patient fallen in last 6 months? No  LIVING ENVIRONMENT: Lives with: lives with their spouse Lives in: 3 story home Stairs: yes   PLOF: Independent  PATIENT GOALS: walk and climb stairs normal, improve strength.  L knee to function as R knee   OBJECTIVE:  Note: Objective measures were completed at Evaluation unless otherwise noted.  DIAGNOSTIC FINDINGS: L Tib/Fib X ray on 12/07/23: FINDINGS: No acute fracture or dislocation. Os trigonum. Moderate tricompartmental osteoarthritis of the knee, most severe in the patellofemoral  compartment. Mild osteoarthritis of the ankle. Soft tissues are unremarkable.   IMPRESSION: 1. No acute fracture or dislocation. 2. Osteoarthritis of the knee and ankle, as above.  Knees x ray in 2021: IMPRESSION: 1. No fracture or dislocation of the bilateral knees. 2. There is bilateral arthrosis of the knees, most significant in the patellofemoral compartments and worse on the right, with relatively preserved femorotibial joint spaces. 3. There are large calcified loose bodies bilaterally, larger and more numerous on the right. 4. Small bilateral knee joint effusions.  CT on 11/14: IMPRESSION: 1. Stable sclerotic metastatic osseous disease without new or progressive findings or pathologic fracture. 2. Patchy tree-in-bud nodularity  in the right middle lobe, suggestive of inflammatory process or atypical infection such as nontuberculous mycobacterial infection. 3. Liver morphology suggestive of cirrhosis without focal hepatic lesion. 4. No findings for metastatic disease soft tissue disease involving the chest abdomen or pelvis.  PATIENT SURVEYS:  LEFS:  40/80  COGNITION: Overall cognitive status: Within functional limits for tasks assessed        LOWER EXTREMITY ROM:  AROM Right eval Left eval Left 1/8  Hip flexion     Hip extension     Hip abduction     Hip adduction     Hip internal rotation     Hip external rotation     Knee flexion 134 111   Knee extension 3/0 AROM/PROM 10/7 AROM/PROM in supine; Lacking 20 deg with LAQ 11/7 AROM/PROM in supine  Ankle dorsiflexion     Ankle plantarflexion     Ankle inversion     Ankle eversion      (Blank rows = not tested)  LOWER EXTREMITY MMT:  MMT Right eval Left eval Right 12/9 Left 12/9  Hip flexion 5/5 4+/5 with pain    Hip extension      Hip abduction Dificulty with correct form including keeping knee straight but tolerates good resistance <3/5 29.4 24.4  Hip adduction      Hip internal rotation       Hip external rotation      Knee flexion 5/5 seated 5/5 seated    Knee extension 5/5 4-/5 with pain    Ankle dorsiflexion      Ankle plantarflexion      Ankle inversion      Ankle eversion       (Blank rows = not tested)   FUNCTIONAL TESTS:  5x STS test: 23.3 sec without UE's  GAIT: Assistive device utilized: None Level of assistance: Complete Independence Comments: Pt has significant genu varus at L knee.  Decreased L knee extension, L toe off, and foot clearance.  Slow gait speed.  3-5/10 pain in L knee with walking in hallway.                                                                                                                                 TREATMENT:    Nustep bilat UE/LE's lvl 4 x 5 mins Pt received L knee flexion and extension PROM in supine per pt tolerance. Pt received manual knee extension stretch with hand under heel Supine quad set with ball under heel x 10 reps with 5 sec hold Longsitting calf stretch with heel hanging off edge of table 2x20 sec Standing calf stretch at wall 2x20 sec Standing hip abd 2x5 just L LE TKE with YTB x 8, 4 reps  PT updated HEP and gave pt a HEP handout.  PT educated pt in correct form and appropriate frequency.      PATIENT EDUCATION:  Education details: dx, exercise form, HEP, rationale of interventions,  relevant anatomy, and POC.  PT answered pt's questions. Person educated: Patient Education method: Explanation, demonstration, verbal and tactile cues, handout Education comprehension: verbalized understanding and needs further education, returned demonstration, verbal and tactile cues required  HOME EXERCISE PROGRAM: Pt has a HEP.   Access Code: [email or web address] URL: [email or web address] Date: 02/09/2024 Prepared by: Mose Minerva  Exercises - Supine Bridge  - 1 x daily - 7 x weekly - 2 sets - 10 reps - Seated Heel Slide  - 2 x daily - 7 x weekly - 2 sets - 10 reps - Seated Hamstring Stretch  - 2 x daily  - 7 x weekly - 2 reps - 20 seconds hold - Seated Long Arc Quad  - 1 x daily - 7 x weekly - 2 sets - 10 reps - Supine Knee Extension Stretch on Towel Roll  - 2-3 x daily - 7 x weekly - 1 reps - 2 minutes hold  Updated HEP: - Supine Heel Slide with Strap  - 2 x daily - 7 x weekly - 1 sets - 10 reps - Seated Hip Abduction with Resistance  - 1 x daily - 4-5 x weekly - 2 sets - 10 reps   ASSESSMENT:  CLINICAL IMPRESSION: Pt is improving as evidenced by subjective reports though still has deficits and limitations.  He reports a 30/10% improvement in pain/sx's and mobility respectively.  He states he is able to do more.  He continues to have limitations in knee extension ROM.  Pt had no improvement with knee extension ROM as evidenced by goniometric measurements.  He has improved tolerance with manual extension stretching.  He continues to be limited with exercises though has improved tolerance overall with exercises.  PT updated HEP and gave pt a HEP handout.  He demonstrated good understanding of HEP.  Pt met STG #1 and partially met #5.  Pt should benefit from continued skilled PT to address impairments and goals and to improve overall function.   OBJECTIVE IMPAIRMENTS: Abnormal gait, decreased activity tolerance, decreased endurance, decreased mobility, difficulty walking, decreased ROM, decreased strength, hypomobility, impaired flexibility, and pain.   ACTIVITY LIMITATIONS: carrying, bending, standing, squatting, stairs, transfers, and locomotion level  PARTICIPATION LIMITATIONS: cleaning, shopping, and community activity  PERSONAL FACTORS: 3+ comorbidities: metastatic CA, anemia, L ankle OA are also affecting patient's functional outcome.   REHAB POTENTIAL: Good  CLINICAL DECISION MAKING: Evolving/moderate complexity  EVALUATION COMPLEXITY: Moderate   GOALS:  SHORT TERM GOALS: Target date: 03/01/2024  Pt will be independent and compliant with HEP for improved pain, strength, ROM,  and function. Baseline: Goal status: GOAL MET  1/8  2.  Pt will demonstrate improved time on the 5x STS test by at least 5 seconds for improved functional LE strength and performance of transfers.  Baseline:  Goal status: INITIAL  3.  Pt will demonstrate improved L knee AROM by at least 5 degrees in both flexion and extension for improved stiffness, mobility, and gait.  Baseline:  Goal status: NOT MET  1/8  4.  Pt will demo at least a 10 deg improvement with seated LAQ for improved quad strength and knee mobility.  Baseline:  Goal status: INITIAL  5.  Pt will report at least a 25% improvement overall in pain and daily mobility.  Baseline:  Goal status:  50%  1/8  MET    LONG TERM GOALS: Target date: 03/29/2024  Pt will demo a reciprocal gait on the stairs with the rail and report he is able  to perform his stairs at home without difficulty.  Baseline:  Goal status: INITIAL  2.  Pt will report he is able to perform extended community ambulation without significant difficulty and pain.  Baseline:  Goal status: INITIAL  3.  Pt will report at least a 60% improvement overall in his knee pain and daily mobility.  Baseline:  Goal status: INITIAL  4.  Pt will demo improved L hip flexion and knee extension strength to 5/5 MMT for improved performance of and tolerance with functional mobility.  (May add hip abd goal after testing next visit) Baseline:  Goal status: INITIAL  5.  Pt will perform the 5x STS test in < than 16 seconds for improved functional LE strength and performance of transfers Baseline:  Goal status: INITIAL     PLAN:  PT FREQUENCY:1-2x/wk  PT DURATION: 8 weeks  PLANNED INTERVENTIONS: [id number]- PT Re-evaluation, 97750- Physical Performance Testing, 97110-Therapeutic exercises, 97530- Therapeutic activity, [id number]- Neuromuscular re-education, 97535- Self Care, [id number]- Manual therapy, [id number]- Gait training, [phone]- Aquatic Therapy, Patient/Family education, Balance  training, Stair training, Taping, and Cryotherapy  PLAN FOR NEXT SESSION: calf and HS stretching.  LE strengthening.  Work on [employer].  Sit to stands.  Gait.  Supine static knee extension stretch   Leigh Minerva III PT, DPT 03/11/2024 3:57 PM         "

## 2024-03-11 ENCOUNTER — Inpatient Hospital Stay

## 2024-03-11 ENCOUNTER — Inpatient Hospital Stay: Attending: Oncology | Admitting: Hematology and Oncology

## 2024-03-11 VITALS — BP 136/85 | HR 66 | Temp 98.5°F | Resp 17

## 2024-03-11 VITALS — BP 147/86 | HR 74 | Temp 98.6°F | Resp 16 | Ht 76.0 in | Wt 188.0 lb

## 2024-03-11 DIAGNOSIS — Z95828 Presence of other vascular implants and grafts: Secondary | ICD-10-CM

## 2024-03-11 DIAGNOSIS — C61 Malignant neoplasm of prostate: Secondary | ICD-10-CM

## 2024-03-11 MED ORDER — DENOSUMAB 120 MG/1.7ML ~~LOC~~ SOLN
120.0000 mg | Freq: Once | SUBCUTANEOUS | Status: AC
  Administered 2024-03-11: 120 mg via SUBCUTANEOUS
  Filled 2024-03-11: qty 1.7

## 2024-03-11 NOTE — Progress Notes (Unsigned)
 " [facility] [phone]   [phone]  PROGRESS NOTE  Patient Care Team: Leigh Elspeth SQUIBB, MD as PCP - General (Gastroenterology) Francella Fairy SAILOR, OD as Consulting Physician (Optometry) Alvaro, Ricardo KATHEE Raddle., MD as Consulting Physician (Urology) Amadeo Windell SAILOR, MD (Inactive) as Consulting Physician (Oncology)  Hematological/Oncological History # Castrate Resistant Advanced Prostate Cancer # Metastatic Prostate Cancer to Lymph Nodes/Bone  01/2015: orchiectomy performed 02/16/2015: Taxotere  chemotherapy at 75 mg/m. Continued until 05/2015 09/2015: start of zytiga . Stopped in 2023 due to insurance issues 04/2021: Xtandi  160 mg daily  03/19/2022: last visit with Dr. Amadeo 05/20/2022: transition care to Dr. Federico   Interval History:  Philip [name] 79 y.o. 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 The patient's last visit was on 09/11/2023. In the interim since the last visit he has continued on Xtandi  therapy.   On exam today Philip [name] reports he is tolerating treatment without any new or concerning symptoms.  His energy and appetite are unchanged.  He does have some insomnia with minimal impact on his daily activities.  He denies nausea, vomiting or bowel habit changes.  He denies easy bruising or signs of active bleeding.  Patient denies fevers, chills, sweats, shortness of breath, chest pain or cough.  He has no other complaints.  Rest of 10 point ROS.  MEDICAL HISTORY:  Past Medical History:  Diagnosis Date   Allergy    Anxiety    Arthritis    Atherosclerosis of aorta 03/11/2017   The 10-year ASCVD risk score Verdon DC Jr., et al., 2013) is: 24.6%   Values used to calculate the score:     Age: 55 years     Sex: Male     Is Non-Hispanic African American: No     Diabetic: No     Tobacco smoker: No     Systolic Blood  Pressure: 154 mmHg     Is BP treated: No     HDL Cholesterol: 71.1 mg/dL     Total Cholesterol: 212 mg/dL   Cancer (HCC)    PROSTATE   Cataract    Chronic idiopathic constipation 03/11/2017   Depression    Edema leg 11/29/2014   left leg from foot to thigh, increasinly worse over last 4 weeks   Essential hypertension 03/30/2018   Gastroesophageal reflux disease without esophagitis 03/11/2017   Hyperlipidemia LDL goal <100 03/16/2017   The 10-year ASCVD risk score Verdon DC Jr., et al., 2013) is: 30.6%   Values used to calculate the score:     Age: 24 years     Sex: Male     Is Non-Hispanic African American: No     Diabetic: No     Tobacco smoker: No     Systolic Blood Pressure: 148 mmHg     Is BP treated: Yes     HDL Cholesterol: 69.3 mg/dL     Total Cholesterol: 211 mg/dL   Hypertension    Hypoglycemia    Swelling LAST 30 DAYS DR [facility] AWARE   LEFT LEG AND FOOT    SURGICAL HISTORY: Past Surgical History:  Procedure Laterality Date   BOWEL DECOMPRESSION N/A 03/07/2021   Procedure: BOWEL DECOMPRESSION;  Surgeon: Leigh Elspeth SQUIBB, MD;  Location: [facility];  Service: Gastroenterology;  Laterality: N/A;   BOWEL DECOMPRESSION N/A 06/07/2021   Procedure: BOWEL DECOMPRESSION;  Surgeon: Rollin Dover, MD;  Location: [facility];  Service: Gastroenterology;  Laterality: N/A;   COLON SURGERY     FLEXIBLE SIGMOIDOSCOPY N/A 03/07/2021   Procedure: FLEXIBLE SIGMOIDOSCOPY;  Surgeon: Leigh Elspeth SQUIBB, MD;  Location: [facility];  Service: Gastroenterology;  Laterality: N/A;   FLEXIBLE SIGMOIDOSCOPY N/A 06/07/2021   Procedure: FLEXIBLE SIGMOIDOSCOPY;  Surgeon: Rollin Dover, MD;  Location: [facility];  Service: Gastroenterology;  Laterality: N/A;   GUM SURGERY     TEETH IMPLANTS ALSO   LAPAROSCOPIC SIGMOID COLECTOMY N/A 06/10/2021   Procedure: LAPAROSCOPIC ASSISTED SIGMOID COLECTOMY;  Surgeon: Gladis Cough, MD;  Location: [facility];  Service: General;  Laterality: N/A;   ORCHIECTOMY  Bilateral 01/03/2015   Procedure: ORCHIECTOMY;  Surgeon: Ricardo Likens, MD;  Location: [facility];  Service: Urology;  Laterality: Bilateral;    SOCIAL HISTORY: Social History   Socioeconomic History   Marital status: Married    Spouse name: Not on file   Number of children: Not on file   Years of education: Not on file   Highest education level: Bachelor's degree (e.g., BA, AB, BS)  Occupational History   Not on file  Tobacco Use   Smoking status: Never   Smokeless tobacco: Never  Vaping Use   Vaping status: Never Used  Substance and Sexual Activity   Alcohol use: Yes    Alcohol/week: 1.0 standard drink of alcohol    Types: 1 Glasses of wine per week    Comment: occasional wine with dinner   Drug use: No   Sexual activity: Not Currently    Birth control/protection: None  Other Topics Concern   Not on file  Social History Narrative   Not on file   Social Drivers of Health   Tobacco Use: Low Risk (03/10/2024)   Patient History    Smoking Tobacco Use: Never    Smokeless Tobacco Use: Never    Passive Exposure: Not on file  Financial Resource Strain: Low Risk (04/21/2023)   Overall Financial Resource Strain (CARDIA)    Difficulty of Paying Living Expenses: Not hard at all  Food Insecurity: No Food Insecurity (04/21/2023)   Hunger Vital Sign    Worried About Running Out of Food in the Last Year: Never true    Ran Out of Food in the Last Year: Never true  Transportation Needs: No Transportation Needs (04/21/2023)   PRAPARE - Administrator, Civil Service (Medical): No    Lack of Transportation (Non-Medical): No  Physical Activity: Insufficiently Active (04/21/2023)   Exercise Vital Sign    Days of Exercise per Week: 7 days    Minutes of Exercise per Session: 20 min  Stress: Stress Concern Present (04/21/2023)   [employer] of Occupational Health - Occupational Stress Questionnaire    Feeling of Stress : Rather much  Social Connections: Socially Isolated  (04/21/2023)   Social Connection and Isolation Panel    Frequency of Communication with Friends and Family: Never    Frequency of Social Gatherings with Friends and Family: Never    Attends Religious Services: Never    Database Administrator or Organizations: No    Attends Engineer, Structural: Not on file    Marital Status: Married  Catering Manager Violence: Not At Risk (04/24/2023)   Humiliation, Afraid, Rape, and Kick questionnaire    Fear of Current or Ex-Partner: No    Emotionally Abused: No    Physically Abused: No    Sexually Abused: No  Depression (PHQ2-9): Low Risk (12/07/2023)   Depression (PHQ2-9)  PHQ-2 Score: 0  Alcohol Screen: Low Risk (04/21/2023)   Alcohol Screen    Last Alcohol Screening Score (AUDIT): 2  Housing: Unknown (04/21/2023)   Housing Stability Vital Sign    Unable to Pay for Housing in the Last Year: No    Number of Times Moved in the Last Year: Not on file    Homeless in the Last Year: No  Utilities: Not At Risk (04/24/2023)   AHC Utilities    Threatened with loss of utilities: No  Health Literacy: Adequate Health Literacy (04/24/2023)   B1300 Health Literacy    Frequency of need for help with medical instructions: Never    FAMILY HISTORY: Family History  Problem Relation Age of Onset   CVA Mother    Lymphoma Mother    Pneumonia Mother    CAD Father    Osteoarthritis Brother    Colon cancer Neg Hx     ALLERGIES:  is allergic to prednisone .  MEDICATIONS:  Current Outpatient Medications  Medication Sig Dispense Refill   aspirin  EC 81 MG tablet Take 81 mg by mouth daily. Swallow whole.     atorvastatin  (LIPITOR) 10 MG tablet TAKE 1 TABLET BY MOUTH EVERY DAY 90 tablet 0   azithromycin  (ZITHROMAX  Z-PAK) 250 MG tablet Take 2 pills on the first day and 1 pill daily thereafter, for a total of 5 days. 6 each 0   BIOTIN 5000 PO Take 1 tablet by mouth daily.     CALCIUM -VITAMIN D PO Take 1 tablet by mouth daily.     cyanocobalamin  (VITAMIN  B12) 1000 MCG tablet Take 1,000 mcg by mouth daily.     Denosumab  (XGEVA  [place]) Inject into the skin. Every 6-8 weeks.     enzalutamide  (XTANDI ) 40 MG tablet Take 4 tablets (160mg ) by mouth once daily as directed by physician. 120 tablet 6   famotidine  (PEPCID ) 20 MG tablet Take 1 tablet (20 mg total) by mouth at bedtime.     ferrous sulfate  325 (65 FE) MG EC tablet Take 1 tablet (325 mg total) by mouth daily with breakfast. 90 tablet 1   fexofenadine (ALLEGRA) 180 MG tablet Take 180 mg by mouth daily.     hydrOXYzine  (ATARAX /VISTARIL ) 10 MG tablet Take 10 mg by mouth daily.     lidocaine -prilocaine  (EMLA ) cream Apply 1 Application topically as needed (access port). 30 g 0   LORazepam  (ATIVAN ) 1 MG tablet Take 1 tablet (1 mg total) by mouth every 8 (eight) hours as needed. for anxiety 90 tablet 0   Multiple Vitamin (MULTIVITAMIN) tablet Take 1 tablet by mouth daily.     olmesartan  (BENICAR ) 20 MG tablet TAKE 1 TABLET BY MOUTH EVERY DAY 90 tablet 0   omeprazole  (PRILOSEC) 20 MG capsule Take 1 capsule (20 mg total) by mouth daily. 90 capsule 3   potassium chloride  SA (KLOR-CON  M) 20 MEQ tablet TAKE 1 TABLET BY MOUTH TWICE A DAY 180 tablet 1   No current facility-administered medications for this visit.    REVIEW OF SYSTEMS:   Constitutional: ( - ) fevers, ( - )  chills , ( - ) night sweats Eyes: ( - ) blurriness of vision, ( - ) double vision, ( - ) watery eyes Ears, nose, mouth, throat, and face: ( - ) mucositis, ( - ) sore throat Respiratory: ( - ) cough, ( - ) dyspnea, ( - ) wheezes Cardiovascular: ( - ) palpitation, ( - ) chest discomfort, ( - ) lower extremity swelling Gastrointestinal:  ( - )  nausea, ( - ) heartburn, ( - ) change in bowel habits Skin: ( - ) abnormal skin rashes Lymphatics: ( - ) new lymphadenopathy, ( - ) easy bruising Neurological: ( - ) numbness, ( - ) tingling, ( - ) new weaknesses Behavioral/Psych: ( - ) mood change, ( - ) new changes  All other systems were reviewed  with the patient and are negative.  PHYSICAL EXAMINATION: ECOG PERFORMANCE STATUS: 0 - Asymptomatic  Vitals:   03/11/24 1451 03/11/24 1454  BP: (!) 163/93 (!) 147/86  Pulse: 74   Resp: 16   Temp: 98.6 F (37 C)   SpO2: 100%    Filed Weights   03/11/24 1451  Weight: 188 lb (85.3 kg)    GENERAL: Well-appearing elderly Caucasian male, alert, no distress and comfortable SKIN: skin color, texture, turgor are normal, no rashes or significant lesions EYES: conjunctiva are pink and non-injected, sclera clear LUNGS: clear to auscultation and percussion with normal breathing effort HEART: regular rate & rhythm and no murmurs and no lower extremity edema Musculoskeletal: no cyanosis of digits and no clubbing  PSYCH: alert & oriented x 3, fluent speech NEURO: no focal motor/sensory deficits  LABORATORY DATA:  I have reviewed the data as listed    Latest Ref Rng & Units 03/10/2024    2:57 PM 02/04/2024    2:55 PM 12/31/2023    2:48 PM  CBC  WBC 4.0 - 10.5 K/uL 6.1  5.1  6.1   Hemoglobin 13.0 - 17.0 g/dL 87.8  88.2  87.7   Hematocrit 39.0 - 52.0 % 35.4  35.3  35.5   Platelets 150 - 400 K/uL 234  221  216        Latest Ref Rng & Units 03/10/2024    2:57 PM 02/04/2024    2:55 PM 12/02/2023    3:15 PM  CMP  Glucose 70 - 99 mg/dL 895  896  896   BUN 8 - 23 mg/dL 22  16  21    Creatinine 0.61 - 1.24 mg/dL 8.75  8.74  8.92   Sodium 135 - 145 mmol/L 136  136  135   Potassium 3.5 - 5.1 mmol/L 4.5  4.8  4.4   Chloride 98 - 111 mmol/L 101  101  103   CO2 22 - 32 mmol/L 24  26  26    Calcium  8.9 - 10.3 mg/dL 9.6  9.4  9.0   Total Protein 6.5 - 8.1 g/dL 6.9  6.8  6.5   Total Bilirubin 0.0 - 1.2 mg/dL 0.3  0.3  0.3   Alkaline Phos 38 - 126 U/L 58  58  47   AST 15 - 41 U/L 19  21  13    ALT 0 - 44 U/L 13  12  9      RADIOGRAPHIC STUDIES: No results found.   ASSESSMENT & PLAN Philip [name] is a 79 y.o. male with medical history significant for castrate resistant advanced prostate cancer  with metastatic spread to the lymph nodes and bone who presents for a follow up visit.  # Castrate Resistant Advanced Prostate Cancer # Metastatic Prostate Cancer to Lymph Nodes/Bone  --patient is s/p orchiectomy, no ADT therapy required --continue Xtandi  160 mg PO daily -- last CT chest abdomen pelvis showed no evidence of disease progression on 01/22/2023.  --Labs from 12/10/2023 showed WBC 5.9, hemoglobin 11.5, platelets 241, creatinine and LFTs are in range, PSA less than 0.02. --Continue with monthly Xgeva  shots with labs  --RTC in  3 months for toxicity check.   #Cirrhotic Morphology of Liver on CT scan -- Found incidentally, evaluated by patient's gastroenterologist Dr. Leigh  No orders of the defined types were placed in this encounter.   All questions were answered. The patient knows to call the clinic with any problems, questions or concerns.  I have spent a total of 30 minutes minutes of face-to-face and non-face-to-face time, preparing to see the patient,  performing a medically appropriate examination, counseling and educating the patient, documenting clinical information in the electronic health record, independently interpreting results and communicating results to the patient, and care coordination.   Johnston Police PA-C Dept of Hematology and Oncology [facility] at [facility] Phone: [phone]    03/11/2024 3:09 PM "

## 2024-03-13 ENCOUNTER — Encounter: Payer: Self-pay | Admitting: Hematology and Oncology

## 2024-03-14 ENCOUNTER — Ambulatory Visit (HOSPITAL_BASED_OUTPATIENT_CLINIC_OR_DEPARTMENT_OTHER): Admitting: Physical Therapy

## 2024-03-14 ENCOUNTER — Encounter (HOSPITAL_BASED_OUTPATIENT_CLINIC_OR_DEPARTMENT_OTHER): Payer: Self-pay | Admitting: Physical Therapy

## 2024-03-14 DIAGNOSIS — M6281 Muscle weakness (generalized): Secondary | ICD-10-CM

## 2024-03-14 DIAGNOSIS — M25562 Pain in left knee: Secondary | ICD-10-CM

## 2024-03-14 NOTE — Therapy (Signed)
 " OUTPATIENT PHYSICAL THERAPY LOWER EXTREMITY TREATMENT   Patient Name: Philip [name] MRN: [id number] DOB:1945-12-25, 79 y.o., male Today's Date: 03/15/2024  END OF SESSION:  PT End of Session - 03/14/24 1323     Visit Number 8    Number of Visits 15    Date for Recertification  03/29/24    Authorization Type MCR    PT Start Time 1320    PT Stop Time 1405    PT Time Calculation (min) 45 min    Activity Tolerance Patient tolerated treatment well;Patient limited by pain    Behavior During Therapy [facility] for tasks assessed/performed              Past Medical History:  Diagnosis Date   Allergy    Anxiety    Arthritis    Atherosclerosis of aorta 03/11/2017   The 10-year ASCVD risk score Verdon DC Jr., et al., 2013) is: 24.6%   Values used to calculate the score:     Age: 69 years     Sex: Male     Is Non-Hispanic African American: No     Diabetic: No     Tobacco smoker: No     Systolic Blood Pressure: 154 mmHg     Is BP treated: No     HDL Cholesterol: 71.1 mg/dL     Total Cholesterol: 212 mg/dL   Cancer (HCC)    PROSTATE   Cataract    Chronic idiopathic constipation 03/11/2017   Depression    Edema leg 11/29/2014   left leg from foot to thigh, increasinly worse over last 4 weeks   Essential hypertension 03/30/2018   Gastroesophageal reflux disease without esophagitis 03/11/2017   Hyperlipidemia LDL goal <100 03/16/2017   The 10-year ASCVD risk score Verdon DC Jr., et al., 2013) is: 30.6%   Values used to calculate the score:     Age: 9 years     Sex: Male     Is Non-Hispanic African American: No     Diabetic: No     Tobacco smoker: No     Systolic Blood Pressure: 148 mmHg     Is BP treated: Yes     HDL Cholesterol: 69.3 mg/dL     Total Cholesterol: 211 mg/dL   Hypertension    Hypoglycemia    Swelling LAST 30 DAYS DR [facility] AWARE   LEFT LEG AND FOOT   Past Surgical History:  Procedure Laterality Date   BOWEL DECOMPRESSION N/A 03/07/2021   Procedure: BOWEL DECOMPRESSION;   Surgeon: Leigh Elspeth SQUIBB, MD;  Location: [facility];  Service: Gastroenterology;  Laterality: N/A;   BOWEL DECOMPRESSION N/A 06/07/2021   Procedure: BOWEL DECOMPRESSION;  Surgeon: Rollin Dover, MD;  Location: [facility];  Service: Gastroenterology;  Laterality: N/A;   COLON SURGERY     FLEXIBLE SIGMOIDOSCOPY N/A 03/07/2021   Procedure: FLEXIBLE SIGMOIDOSCOPY;  Surgeon: Leigh Elspeth SQUIBB, MD;  Location: [facility];  Service: Gastroenterology;  Laterality: N/A;   FLEXIBLE SIGMOIDOSCOPY N/A 06/07/2021   Procedure: FLEXIBLE SIGMOIDOSCOPY;  Surgeon: Rollin Dover, MD;  Location: [facility];  Service: Gastroenterology;  Laterality: N/A;   GUM SURGERY     TEETH IMPLANTS ALSO   LAPAROSCOPIC SIGMOID COLECTOMY N/A 06/10/2021   Procedure: LAPAROSCOPIC ASSISTED SIGMOID COLECTOMY;  Surgeon: Gladis Cough, MD;  Location: [facility];  Service: General;  Laterality: N/A;   ORCHIECTOMY Bilateral 01/03/2015   Procedure: ORCHIECTOMY;  Surgeon: Ricardo Likens, MD;  Location: [facility];  Service: Urology;  Laterality: Bilateral;   Patient Active  Problem List   Diagnosis Date Noted   Primary osteoarthritis of both knees 12/12/2023   Need for immunization against influenza 12/07/2023   Anemia due to zinc  deficiency 10/08/2022   Iron deficiency anemia 03/07/2021   Tinnitus of both ears 09/07/2019   Laryngopharyngeal reflux (LPR) 09/07/2019   Essential hypertension 03/30/2018   Port-A-Cath in place 08/25/2017   Hyperlipidemia LDL goal <100 03/16/2017   Atherosclerosis of aorta 03/11/2017   Chronic idiopathic constipation 03/11/2017   Seasonal allergic rhinitis due to pollen 03/11/2017   Gastroesophageal reflux disease without esophagitis 03/11/2017   Malignant neoplasm of bone (HCC) 10/16/2015   Prostate cancer (HCC) 02/02/2015    PCP: Joshua Debby CROME, MD   REFERRING PROVIDER: Joshua Debby CROME, MD   REFERRING DIAG: M17.0 (ICD-10-CM) - Primary osteoarthritis of both knees   THERAPY DIAG:   Left knee pain, unspecified chronicity  Muscle weakness (generalized)  Stiffness of left knee, not elsewhere classified  Rationale for Evaluation and Treatment: Rehabilitation  ONSET DATE: PT order 12/12/23  SUBJECTIVE:   SUBJECTIVE STATEMENT: Pt states he felt good, felt loose after prior treatment.  Pt has been performing his HEP.  Pt has performed his seated exercises and standing calf stretch at wall already today.  Pt has pain with stairs at home though not intolerable.  He has stiffness and pain when trying to walk after sitting awhile.     PERTINENT HISTORY: -Prostate CA with metastatic spread to lymph nodes and bone (dx 2016)--Pt is on oral chemotherapy.  Pt is followed by Dr. Federico and gets an annual CT scan.  Pt states his CA is not active.  Port-a-cath located on R side of chest. -possible lung infection -L ankle OA, R knee arthrosis -anemia, anxiety, depression, HTN -Scarring on liver--Pt states for now the liver is ok    PAIN:  L knee:  7/10  Pt denies pain in R knee.  PRECAUTIONS: Other: metastatic CA   WEIGHT BEARING RESTRICTIONS: No  FALLS:  Has patient fallen in last 6 months? No  LIVING ENVIRONMENT: Lives with: lives with their spouse Lives in: 3 story home Stairs: yes   PLOF: Independent  PATIENT GOALS: walk and climb stairs normal, improve strength.  L knee to function as R knee   OBJECTIVE:  Note: Objective measures were completed at Evaluation unless otherwise noted.  DIAGNOSTIC FINDINGS: L Tib/Fib X ray on 12/07/23: FINDINGS: No acute fracture or dislocation. Os trigonum. Moderate tricompartmental osteoarthritis of the knee, most severe in the patellofemoral compartment. Mild osteoarthritis of the ankle. Soft tissues are unremarkable.   IMPRESSION: 1. No acute fracture or dislocation. 2. Osteoarthritis of the knee and ankle, as above.  Knees x ray in 2021: IMPRESSION: 1. No fracture or dislocation of the bilateral knees. 2.  There is bilateral arthrosis of the knees, most significant in the patellofemoral compartments and worse on the right, with relatively preserved femorotibial joint spaces. 3. There are large calcified loose bodies bilaterally, larger and more numerous on the right. 4. Small bilateral knee joint effusions.  CT on 11/14: IMPRESSION: 1. Stable sclerotic metastatic osseous disease without new or progressive findings or pathologic fracture. 2. Patchy tree-in-bud nodularity in the right middle lobe, suggestive of inflammatory process or atypical infection such as nontuberculous mycobacterial infection. 3. Liver morphology suggestive of cirrhosis without focal hepatic lesion. 4. No findings for metastatic disease soft tissue disease involving the chest abdomen or pelvis.  PATIENT SURVEYS:  LEFS:  40/80  COGNITION: Overall cognitive status: Within functional limits  for tasks assessed        LOWER EXTREMITY ROM:  AROM Right eval Left eval Left 1/8  Hip flexion     Hip extension     Hip abduction     Hip adduction     Hip internal rotation     Hip external rotation     Knee flexion 134 111   Knee extension 3/0 AROM/PROM 10/7 AROM/PROM in supine; Lacking 20 deg with LAQ 11/7 AROM/PROM in supine  Ankle dorsiflexion     Ankle plantarflexion     Ankle inversion     Ankle eversion      (Blank rows = not tested)  LOWER EXTREMITY MMT:  MMT Right eval Left eval Right 12/9 Left 12/9  Hip flexion 5/5 4+/5 with pain    Hip extension      Hip abduction Dificulty with correct form including keeping knee straight but tolerates good resistance <3/5 29.4 24.4  Hip adduction      Hip internal rotation      Hip external rotation      Knee flexion 5/5 seated 5/5 seated    Knee extension 5/5 4-/5 with pain    Ankle dorsiflexion      Ankle plantarflexion      Ankle inversion      Ankle eversion       (Blank rows = not tested)   FUNCTIONAL TESTS:  5x STS test: 23.3 sec without  UE's  GAIT: Assistive device utilized: None Level of assistance: Complete Independence Comments: Pt has significant genu varus at L knee.  Decreased L knee extension, L toe off, and foot clearance.  Slow gait speed.  3-5/10 pain in L knee with walking in hallway.                                                                                                                                 TREATMENT:    Supine quad set with ball under heel x 10 reps with 5 sec hold Pt received L knee flexion and extension PROM in supine per pt tolerance. Pt received gentle manual knee extension stretch  LAQ x 10 AROM, YTB x10, 6 reps Standing calf stretch at wall 2x20 sec Standing hip abd x7,4 just L LE TKE with YTB x 8 reps.  Attempted a 2nd set though PT stopped due to pain. Standing f/b weight shifts in staggered stance with UE support Nustep bilat UE/LE's lvl 4 x 5 mins Attempted 2 inch step up with UE support though stopped due to pain Sidestepping 8 ft with UE support x 1 lap.     PATIENT EDUCATION:  Education details: dx, exercise form, HEP, rationale of interventions, relevant anatomy, and POC.  PT answered pt's questions. Person educated: Patient Education method: Explanation, demonstration, verbal and tactile cues, handout Education comprehension: verbalized understanding and needs further education, returned demonstration, verbal and tactile cues required  HOME EXERCISE PROGRAM: Pt has  a HEP.   Access Code: [email or web address] URL: [email or web address] Date: 02/09/2024 Prepared by: Mose Minerva  Exercises - Supine Bridge  - 1 x daily - 7 x weekly - 2 sets - 10 reps - Seated Heel Slide  - 2 x daily - 7 x weekly - 2 sets - 10 reps - Seated Hamstring Stretch  - 2 x daily - 7 x weekly - 2 reps - 20 seconds hold - Seated Long Arc Quad  - 1 x daily - 7 x weekly - 2 sets - 10 reps - Supine Knee Extension Stretch on Towel Roll  - 2-3 x daily - 7 x weekly - 1 reps - 2 minutes hold -  Supine Heel Slide with Strap  - 2 x daily - 7 x weekly - 1 sets - 10 reps - Seated Hip Abduction with Resistance  - 1 x daily - 4-5 x weekly - 2 sets - 10 reps   ASSESSMENT:  CLINICAL IMPRESSION: PT performed knee PROM and pt had more pain with extension PROM today.  Pt's extension ROM did improve with gentle stretching.  PT progressed exercise intensity by attempting to increase closed chain activities though pt had increased pain.  He had pain with standing resisted TKE.  He attempted a 2nd set though PT stopped due to pain.  PT attempted a 2 inch step up with UE support though only performed 2-3 reps due to pain.  He was limited with exercises due to pain.  Pt reports improved pain from 7/10 before treatment to 5/10 after treatment.    OBJECTIVE IMPAIRMENTS: Abnormal gait, decreased activity tolerance, decreased endurance, decreased mobility, difficulty walking, decreased ROM, decreased strength, hypomobility, impaired flexibility, and pain.   ACTIVITY LIMITATIONS: carrying, bending, standing, squatting, stairs, transfers, and locomotion level  PARTICIPATION LIMITATIONS: cleaning, shopping, and community activity  PERSONAL FACTORS: 3+ comorbidities: metastatic CA, anemia, L ankle OA are also affecting patient's functional outcome.   REHAB POTENTIAL: Good  CLINICAL DECISION MAKING: Evolving/moderate complexity  EVALUATION COMPLEXITY: Moderate   GOALS:  SHORT TERM GOALS: Target date: 03/01/2024  Pt will be independent and compliant with HEP for improved pain, strength, ROM, and function. Baseline: Goal status: GOAL MET  1/8  2.  Pt will demonstrate improved time on the 5x STS test by at least 5 seconds for improved functional LE strength and performance of transfers.  Baseline:  Goal status: INITIAL  3.  Pt will demonstrate improved L knee AROM by at least 5 degrees in both flexion and extension for improved stiffness, mobility, and gait.  Baseline:  Goal status: NOT MET   1/8  4.  Pt will demo at least a 10 deg improvement with seated LAQ for improved quad strength and knee mobility.  Baseline:  Goal status: INITIAL  5.  Pt will report at least a 25% improvement overall in pain and daily mobility.  Baseline:  Goal status:  50%  1/8  MET    LONG TERM GOALS: Target date: 03/29/2024  Pt will demo a reciprocal gait on the stairs with the rail and report he is able to perform his stairs at home without difficulty.  Baseline:  Goal status: INITIAL  2.  Pt will report he is able to perform extended community ambulation without significant difficulty and pain.  Baseline:  Goal status: INITIAL  3.  Pt will report at least a 60% improvement overall in his knee pain and daily mobility.  Baseline:  Goal status: INITIAL  4.  Pt will demo  improved L hip flexion and knee extension strength to 5/5 MMT for improved performance of and tolerance with functional mobility.  (May add hip abd goal after testing next visit) Baseline:  Goal status: INITIAL  5.  Pt will perform the 5x STS test in < than 16 seconds for improved functional LE strength and performance of transfers Baseline:  Goal status: INITIAL     PLAN:  PT FREQUENCY:1-2x/wk  PT DURATION: 8 weeks  PLANNED INTERVENTIONS: [id number]- PT Re-evaluation, 97750- Physical Performance Testing, 97110-Therapeutic exercises, 97530- Therapeutic activity, [id number]- Neuromuscular re-education, 97535- Self Care, [id number]- Manual therapy, [id number]- Gait training, [phone]- Aquatic Therapy, Patient/Family education, Balance training, Stair training, Taping, and Cryotherapy  PLAN FOR NEXT SESSION: calf and HS stretching.  LE strengthening.  Work on [employer].  Sit to stands.  Gait.  Supine static knee extension stretch   Leigh Minerva III PT, DPT 03/15/2024 7:16 PM         "

## 2024-03-16 ENCOUNTER — Ambulatory Visit (HOSPITAL_BASED_OUTPATIENT_CLINIC_OR_DEPARTMENT_OTHER): Admitting: Physical Therapy

## 2024-03-16 ENCOUNTER — Encounter (HOSPITAL_BASED_OUTPATIENT_CLINIC_OR_DEPARTMENT_OTHER): Payer: Self-pay | Admitting: Physical Therapy

## 2024-03-16 DIAGNOSIS — R262 Difficulty in walking, not elsewhere classified: Secondary | ICD-10-CM

## 2024-03-16 DIAGNOSIS — M6281 Muscle weakness (generalized): Secondary | ICD-10-CM

## 2024-03-16 DIAGNOSIS — M25662 Stiffness of left knee, not elsewhere classified: Secondary | ICD-10-CM

## 2024-03-16 DIAGNOSIS — M25562 Pain in left knee: Secondary | ICD-10-CM

## 2024-03-16 NOTE — Therapy (Signed)
 " OUTPATIENT PHYSICAL THERAPY LOWER EXTREMITY TREATMENT   Patient Name: Philip [name] MRN: [id number] DOB:1946-02-16, 79 y.o., male Today's Date: 03/17/2024  END OF SESSION:  PT End of Session - 03/16/24 1510     Visit Number 9    Number of Visits 15    Date for Recertification  03/29/24    Authorization Type MCR    PT Start Time 1458    PT Stop Time 1538    PT Time Calculation (min) 40 min    Activity Tolerance Patient tolerated treatment well    Behavior During Therapy [facility] for tasks assessed/performed              Past Medical History:  Diagnosis Date   Allergy    Anxiety    Arthritis    Atherosclerosis of aorta 03/11/2017   The 10-year ASCVD risk score Verdon DC Jr., et al., 2013) is: 24.6%   Values used to calculate the score:     Age: 30 years     Sex: Male     Is Non-Hispanic African American: No     Diabetic: No     Tobacco smoker: No     Systolic Blood Pressure: 154 mmHg     Is BP treated: No     HDL Cholesterol: 71.1 mg/dL     Total Cholesterol: 212 mg/dL   Cancer (HCC)    PROSTATE   Cataract    Chronic idiopathic constipation 03/11/2017   Depression    Edema leg 11/29/2014   left leg from foot to thigh, increasinly worse over last 4 weeks   Essential hypertension 03/30/2018   Gastroesophageal reflux disease without esophagitis 03/11/2017   Hyperlipidemia LDL goal <100 03/16/2017   The 10-year ASCVD risk score Verdon DC Jr., et al., 2013) is: 30.6%   Values used to calculate the score:     Age: 89 years     Sex: Male     Is Non-Hispanic African American: No     Diabetic: No     Tobacco smoker: No     Systolic Blood Pressure: 148 mmHg     Is BP treated: Yes     HDL Cholesterol: 69.3 mg/dL     Total Cholesterol: 211 mg/dL   Hypertension    Hypoglycemia    Swelling LAST 30 DAYS DR [facility] AWARE   LEFT LEG AND FOOT   Past Surgical History:  Procedure Laterality Date   BOWEL DECOMPRESSION N/A 03/07/2021   Procedure: BOWEL DECOMPRESSION;  Surgeon: Leigh Elspeth SQUIBB,  MD;  Location: [facility];  Service: Gastroenterology;  Laterality: N/A;   BOWEL DECOMPRESSION N/A 06/07/2021   Procedure: BOWEL DECOMPRESSION;  Surgeon: Rollin Dover, MD;  Location: [facility];  Service: Gastroenterology;  Laterality: N/A;   COLON SURGERY     FLEXIBLE SIGMOIDOSCOPY N/A 03/07/2021   Procedure: FLEXIBLE SIGMOIDOSCOPY;  Surgeon: Leigh Elspeth SQUIBB, MD;  Location: [facility];  Service: Gastroenterology;  Laterality: N/A;   FLEXIBLE SIGMOIDOSCOPY N/A 06/07/2021   Procedure: FLEXIBLE SIGMOIDOSCOPY;  Surgeon: Rollin Dover, MD;  Location: [facility];  Service: Gastroenterology;  Laterality: N/A;   GUM SURGERY     TEETH IMPLANTS ALSO   LAPAROSCOPIC SIGMOID COLECTOMY N/A 06/10/2021   Procedure: LAPAROSCOPIC ASSISTED SIGMOID COLECTOMY;  Surgeon: Gladis Cough, MD;  Location: [facility];  Service: General;  Laterality: N/A;   ORCHIECTOMY Bilateral 01/03/2015   Procedure: ORCHIECTOMY;  Surgeon: Ricardo Likens, MD;  Location: [facility];  Service: Urology;  Laterality: Bilateral;   Patient Active Problem List  Diagnosis Date Noted   Primary osteoarthritis of both knees 12/12/2023   Need for immunization against influenza 12/07/2023   Anemia due to zinc  deficiency 10/08/2022   Iron deficiency anemia 03/07/2021   Tinnitus of both ears 09/07/2019   Laryngopharyngeal reflux (LPR) 09/07/2019   Essential hypertension 03/30/2018   Port-A-Cath in place 08/25/2017   Hyperlipidemia LDL goal <100 03/16/2017   Atherosclerosis of aorta 03/11/2017   Chronic idiopathic constipation 03/11/2017   Seasonal allergic rhinitis due to pollen 03/11/2017   Gastroesophageal reflux disease without esophagitis 03/11/2017   Malignant neoplasm of bone (HCC) 10/16/2015   Prostate cancer (HCC) 02/02/2015    PCP: Joshua Debby CROME, MD   REFERRING PROVIDER: Joshua Debby CROME, MD   REFERRING DIAG: M17.0 (ICD-10-CM) - Primary osteoarthritis of both knees   THERAPY DIAG:  Left knee pain, unspecified  chronicity  Muscle weakness (generalized)  Difficulty in walking, not elsewhere classified  Stiffness of left knee, not elsewhere classified  Stiffness of left knee, not elsewhere classified  Rationale for Evaluation and Treatment: Rehabilitation  ONSET DATE: PT order 12/12/23  SUBJECTIVE:   SUBJECTIVE STATEMENT: Pt states he felt good, felt loose after prior treatment.  Pt has been performing his HEP.  Pt has performed his seated exercises and standing calf stretch at wall already today.  Pt has pain with stairs at home though not intolerable.  He has stiffness and pain when trying to walk after sitting awhile.     PERTINENT HISTORY: -Prostate CA with metastatic spread to lymph nodes and bone (dx 2016)--Pt is on oral chemotherapy.  Pt is followed by Dr. Federico and gets an annual CT scan.  Pt states his CA is not active.  Port-a-cath located on R side of chest. -possible lung infection -L ankle OA, R knee arthrosis -anemia, anxiety, depression, HTN -Scarring on liver--Pt states for now the liver is ok    PAIN:  L knee:  6/10  Pt denies pain in R knee.  PRECAUTIONS: Other: metastatic CA   WEIGHT BEARING RESTRICTIONS: No  FALLS:  Has patient fallen in last 6 months? No  LIVING ENVIRONMENT: Lives with: lives with their spouse Lives in: 3 story home Stairs: yes   PLOF: Independent  PATIENT GOALS: walk and climb stairs normal, improve strength.  L knee to function as R knee   OBJECTIVE:  Note: Objective measures were completed at Evaluation unless otherwise noted.  DIAGNOSTIC FINDINGS: L Tib/Fib X ray on 12/07/23: FINDINGS: No acute fracture or dislocation. Os trigonum. Moderate tricompartmental osteoarthritis of the knee, most severe in the patellofemoral compartment. Mild osteoarthritis of the ankle. Soft tissues are unremarkable.   IMPRESSION: 1. No acute fracture or dislocation. 2. Osteoarthritis of the knee and ankle, as above.  Knees x ray in  2021: IMPRESSION: 1. No fracture or dislocation of the bilateral knees. 2. There is bilateral arthrosis of the knees, most significant in the patellofemoral compartments and worse on the right, with relatively preserved femorotibial joint spaces. 3. There are large calcified loose bodies bilaterally, larger and more numerous on the right. 4. Small bilateral knee joint effusions.  CT on 11/14: IMPRESSION: 1. Stable sclerotic metastatic osseous disease without new or progressive findings or pathologic fracture. 2. Patchy tree-in-bud nodularity in the right middle lobe, suggestive of inflammatory process or atypical infection such as nontuberculous mycobacterial infection. 3. Liver morphology suggestive of cirrhosis without focal hepatic lesion. 4. No findings for metastatic disease soft tissue disease involving the chest abdomen or pelvis.  PATIENT SURVEYS:  LEFS:  40/80  COGNITION: Overall cognitive status: Within functional limits for tasks assessed        LOWER EXTREMITY ROM:  AROM Right eval Left eval Left 1/8  Hip flexion     Hip extension     Hip abduction     Hip adduction     Hip internal rotation     Hip external rotation     Knee flexion 134 111   Knee extension 3/0 AROM/PROM 10/7 AROM/PROM in supine; Lacking 20 deg with LAQ 11/7 AROM/PROM in supine  Ankle dorsiflexion     Ankle plantarflexion     Ankle inversion     Ankle eversion      (Blank rows = not tested)  LOWER EXTREMITY MMT:  MMT Right eval Left eval Right 12/9 Left 12/9  Hip flexion 5/5 4+/5 with pain    Hip extension      Hip abduction Dificulty with correct form including keeping knee straight but tolerates good resistance <3/5 29.4 24.4  Hip adduction      Hip internal rotation      Hip external rotation      Knee flexion 5/5 seated 5/5 seated    Knee extension 5/5 4-/5 with pain    Ankle dorsiflexion      Ankle plantarflexion      Ankle inversion      Ankle eversion        (Blank rows = not tested)   FUNCTIONAL TESTS:  5x STS test: 23.3 sec without UE's  GAIT: Assistive device utilized: None Level of assistance: Complete Independence Comments: Pt has significant genu varus at L knee.  Decreased L knee extension, L toe off, and foot clearance.  Slow gait speed.  3-5/10 pain in L knee with walking in hallway.                                                                                                                                 TREATMENT:    Nustep bilat UE/LE's lvl 4 x 5 mins  270 steps Supine quad set with ball under heel 2x10 reps with 5 sec hold Pt received L knee flexion and extension PROM in supine per pt tolerance. Pt received gentle manual knee extension stretch  LAQ x 7 reps with YTB, attempted with 1# but pt unable TKE with YTB x10, 6 Sit to stands with hands on knees at 25 3/4 inch height x 5 reps, x 4 reps 27 inch height Standing f/b weight shifts in staggered stance with UE support x 5 reps     PATIENT EDUCATION:  Education details: dx, exercise form, HEP, rationale of interventions, relevant anatomy, and POC.  PT answered pt's questions. Person educated: Patient Education method: Explanation, demonstration, verbal and tactile cues, handout Education comprehension: verbalized understanding and needs further education, returned demonstration, verbal and tactile cues required  HOME EXERCISE PROGRAM: Pt has a HEP.   Access Code: [facility]  URL: [email or web address] Date: 02/09/2024 Prepared by: Mose Minerva  Exercises - Supine Bridge  - 1 x daily - 7 x weekly - 2 sets - 10 reps - Seated Heel Slide  - 2 x daily - 7 x weekly - 2 sets - 10 reps - Seated Hamstring Stretch  - 2 x daily - 7 x weekly - 2 reps - 20 seconds hold - Seated Long Arc Quad  - 1 x daily - 7 x weekly - 2 sets - 10 reps - Supine Knee Extension Stretch on Towel Roll  - 2-3 x daily - 7 x weekly - 1 reps - 2 minutes hold - Supine Heel Slide with  Strap  - 2 x daily - 7 x weekly - 1 sets - 10 reps - Seated Hip Abduction with Resistance  - 1 x daily - 4-5 x weekly - 2 sets - 10 reps   ASSESSMENT:  CLINICAL IMPRESSION: Pt is progressing with exercises slowly though continues to have pain with exercises.  Pt was limited with LAQ with theraband due to pain.  PT tried LAQ with a 1# weight though pt was unable to perform.  PT performed exercises focused on quad strength, knee extension ROM, Wb'ing, and functional strengthening.  Pt continues to have pain and difficulty with sit to stands.  PT measured height of table to determine a good height for pt to perform with reduced pain, yet be challenged and also for progression.  Pt only able to perform 5 reps on weight shifts due to pain.  Pt reports increased pain from 6/10 before treatment to 7-8/10 after treatment.  PT to perform PN next visit.      OBJECTIVE IMPAIRMENTS: Abnormal gait, decreased activity tolerance, decreased endurance, decreased mobility, difficulty walking, decreased ROM, decreased strength, hypomobility, impaired flexibility, and pain.   ACTIVITY LIMITATIONS: carrying, bending, standing, squatting, stairs, transfers, and locomotion level  PARTICIPATION LIMITATIONS: cleaning, shopping, and community activity  PERSONAL FACTORS: 3+ comorbidities: metastatic CA, anemia, L ankle OA are also affecting patient's functional outcome.   REHAB POTENTIAL: Good  CLINICAL DECISION MAKING: Evolving/moderate complexity  EVALUATION COMPLEXITY: Moderate   GOALS:  SHORT TERM GOALS: Target date: 03/01/2024  Pt will be independent and compliant with HEP for improved pain, strength, ROM, and function. Baseline: Goal status: GOAL MET  1/8  2.  Pt will demonstrate improved time on the 5x STS test by at least 5 seconds for improved functional LE strength and performance of transfers.  Baseline:  Goal status: INITIAL  3.  Pt will demonstrate improved L knee AROM by at least 5 degrees  in both flexion and extension for improved stiffness, mobility, and gait.  Baseline:  Goal status: NOT MET  1/8  4.  Pt will demo at least a 10 deg improvement with seated LAQ for improved quad strength and knee mobility.  Baseline:  Goal status: INITIAL  5.  Pt will report at least a 25% improvement overall in pain and daily mobility.  Baseline:  Goal status:  50%  1/8  MET    LONG TERM GOALS: Target date: 03/29/2024  Pt will demo a reciprocal gait on the stairs with the rail and report he is able to perform his stairs at home without difficulty.  Baseline:  Goal status: INITIAL  2.  Pt will report he is able to perform extended community ambulation without significant difficulty and pain.  Baseline:  Goal status: INITIAL  3.  Pt will report at least a 60% improvement overall  in his knee pain and daily mobility.  Baseline:  Goal status: INITIAL  4.  Pt will demo improved L hip flexion and knee extension strength to 5/5 MMT for improved performance of and tolerance with functional mobility.  (May add hip abd goal after testing next visit) Baseline:  Goal status: INITIAL  5.  Pt will perform the 5x STS test in < than 16 seconds for improved functional LE strength and performance of transfers Baseline:  Goal status: INITIAL     PLAN:  PT FREQUENCY:1-2x/wk  PT DURATION: 8 weeks  PLANNED INTERVENTIONS: [id number]- PT Re-evaluation, 97750- Physical Performance Testing, 97110-Therapeutic exercises, 97530- Therapeutic activity, [id number]- Neuromuscular re-education, 97535- Self Care, [id number]- Manual therapy, [id number]- Gait training, [phone]- Aquatic Therapy, Patient/Family education, Balance training, Stair training, Taping, and Cryotherapy  PLAN FOR NEXT SESSION: calf and HS stretching.  LE strengthening.  Work on [employer].  Sit to stands.  Gait. Knee extension ROM.  PN next visit.    Leigh Minerva III PT, DPT 03/18/24 8:33 AM          "

## 2024-03-17 ENCOUNTER — Other Ambulatory Visit: Payer: Self-pay

## 2024-03-18 ENCOUNTER — Ambulatory Visit (HOSPITAL_COMMUNITY)
Admission: RE | Admit: 2024-03-18 | Discharge: 2024-03-18 | Disposition: A | Discharge status: Home / Self Care | Source: Ambulatory Visit | Attending: Hematology and Oncology | Admitting: Hematology and Oncology

## 2024-03-18 DIAGNOSIS — J984 Other disorders of lung: Secondary | ICD-10-CM | POA: Diagnosis present

## 2024-03-18 MED ORDER — IOHEXOL 300 MG/ML  SOLN
75.0000 mL | Freq: Once | INTRAMUSCULAR | Status: AC | PRN
  Administered 2024-03-18: 75 mL via INTRAVENOUS

## 2024-03-18 MED ORDER — HEPARIN SOD (PORK) LOCK FLUSH 100 UNIT/ML IV SOLN
INTRAVENOUS | Status: AC
  Filled 2024-03-18: qty 5

## 2024-03-21 ENCOUNTER — Encounter (HOSPITAL_BASED_OUTPATIENT_CLINIC_OR_DEPARTMENT_OTHER): Payer: Self-pay | Admitting: Physical Therapy

## 2024-03-21 ENCOUNTER — Ambulatory Visit (HOSPITAL_BASED_OUTPATIENT_CLINIC_OR_DEPARTMENT_OTHER): Admitting: Physical Therapy

## 2024-03-21 DIAGNOSIS — R262 Difficulty in walking, not elsewhere classified: Secondary | ICD-10-CM

## 2024-03-21 DIAGNOSIS — M25662 Stiffness of left knee, not elsewhere classified: Secondary | ICD-10-CM

## 2024-03-21 DIAGNOSIS — M6281 Muscle weakness (generalized): Secondary | ICD-10-CM

## 2024-03-21 DIAGNOSIS — M25562 Pain in left knee: Secondary | ICD-10-CM

## 2024-03-21 NOTE — Therapy (Signed)
 " OUTPATIENT PHYSICAL THERAPY LOWER EXTREMITY TREATMENT   Patient Name: Philip [name] MRN: [id number] DOB:December 22, 1945, 79 y.o., male Today's Date: 03/22/2024  END OF SESSION:  PT End of Session - 03/21/24 1425     Visit Number 10    Number of Visits 20    Date for Recertification  04/25/24    Authorization Type MCR    PT Start Time 1415    PT Stop Time 1455    PT Time Calculation (min) 40 min    Activity Tolerance Patient tolerated treatment well    Behavior During Therapy [facility] for tasks assessed/performed              Past Medical History:  Diagnosis Date   Allergy    Anxiety    Arthritis    Atherosclerosis of aorta 03/11/2017   The 10-year ASCVD risk score Verdon DC Jr., et al., 2013) is: 24.6%   Values used to calculate the score:     Age: 37 years     Sex: Male     Is Non-Hispanic African American: No     Diabetic: No     Tobacco smoker: No     Systolic Blood Pressure: 154 mmHg     Is BP treated: No     HDL Cholesterol: 71.1 mg/dL     Total Cholesterol: 212 mg/dL   Cancer (HCC)    PROSTATE   Cataract    Chronic idiopathic constipation 03/11/2017   Depression    Edema leg 11/29/2014   left leg from foot to thigh, increasinly worse over last 4 weeks   Essential hypertension 03/30/2018   Gastroesophageal reflux disease without esophagitis 03/11/2017   Hyperlipidemia LDL goal <100 03/16/2017   The 10-year ASCVD risk score Verdon DC Jr., et al., 2013) is: 30.6%   Values used to calculate the score:     Age: 24 years     Sex: Male     Is Non-Hispanic African American: No     Diabetic: No     Tobacco smoker: No     Systolic Blood Pressure: 148 mmHg     Is BP treated: Yes     HDL Cholesterol: 69.3 mg/dL     Total Cholesterol: 211 mg/dL   Hypertension    Hypoglycemia    Swelling LAST 30 DAYS DR [facility] AWARE   LEFT LEG AND FOOT   Past Surgical History:  Procedure Laterality Date   BOWEL DECOMPRESSION N/A 03/07/2021   Procedure: BOWEL DECOMPRESSION;  Surgeon: Leigh Elspeth SQUIBB, MD;  Location: [facility];  Service: Gastroenterology;  Laterality: N/A;   BOWEL DECOMPRESSION N/A 06/07/2021   Procedure: BOWEL DECOMPRESSION;  Surgeon: Rollin Dover, MD;  Location: [facility];  Service: Gastroenterology;  Laterality: N/A;   COLON SURGERY     FLEXIBLE SIGMOIDOSCOPY N/A 03/07/2021   Procedure: FLEXIBLE SIGMOIDOSCOPY;  Surgeon: Leigh Elspeth SQUIBB, MD;  Location: [facility];  Service: Gastroenterology;  Laterality: N/A;   FLEXIBLE SIGMOIDOSCOPY N/A 06/07/2021   Procedure: FLEXIBLE SIGMOIDOSCOPY;  Surgeon: Rollin Dover, MD;  Location: [facility];  Service: Gastroenterology;  Laterality: N/A;   GUM SURGERY     TEETH IMPLANTS ALSO   LAPAROSCOPIC SIGMOID COLECTOMY N/A 06/10/2021   Procedure: LAPAROSCOPIC ASSISTED SIGMOID COLECTOMY;  Surgeon: Gladis Cough, MD;  Location: [facility];  Service: General;  Laterality: N/A;   ORCHIECTOMY Bilateral 01/03/2015   Procedure: ORCHIECTOMY;  Surgeon: Ricardo Likens, MD;  Location: [facility];  Service: Urology;  Laterality: Bilateral;   Patient Active Problem List  Diagnosis Date Noted   Primary osteoarthritis of both knees 12/12/2023   Need for immunization against influenza 12/07/2023   Anemia due to zinc  deficiency 10/08/2022   Iron deficiency anemia 03/07/2021   Tinnitus of both ears 09/07/2019   Laryngopharyngeal reflux (LPR) 09/07/2019   Essential hypertension 03/30/2018   Port-A-Cath in place 08/25/2017   Hyperlipidemia LDL goal <100 03/16/2017   Atherosclerosis of aorta 03/11/2017   Chronic idiopathic constipation 03/11/2017   Seasonal allergic rhinitis due to pollen 03/11/2017   Gastroesophageal reflux disease without esophagitis 03/11/2017   Malignant neoplasm of bone (HCC) 10/16/2015   Prostate cancer (HCC) 02/02/2015    PCP: Joshua Debby CROME, MD   REFERRING PROVIDER: Joshua Debby CROME, MD   REFERRING DIAG: M17.0 (ICD-10-CM) - Primary osteoarthritis of both knees   THERAPY DIAG:  Left knee pain, unspecified  chronicity  Muscle weakness (generalized)  Difficulty in walking, not elsewhere classified  Stiffness of left knee, not elsewhere classified  Stiffness of left knee, not elsewhere classified  Rationale for Evaluation and Treatment: Rehabilitation  ONSET DATE: PT order 12/12/23  SUBJECTIVE:   SUBJECTIVE STATEMENT: Pt states he had increased pain after prior treatment.  Pt has been performing his HEP.  Pt states it's slowly improving.  We're making progress, but it's a slow progress  Pt has increased pain with performing stairs including his garage steps.  Pt states his knee wants to lock up when he stands up after sitting for an hour or more.  He has to wait a little bit initially with standing before he walks.  Pt reports 25% improvement in pain and daily mobility.     Pt has performed his seated exercises and standing calf stretch at wall already today.  Pt has pain with stairs at home though not intolerable.  He has stiffness and pain when trying to walk after sitting awhile.     PERTINENT HISTORY: -Prostate CA with metastatic spread to lymph nodes and bone (dx 2016)--Pt is on oral chemotherapy.  Pt is followed by Dr. Federico and gets an annual CT scan.  Pt states his CA is not active.  Port-a-cath located on R side of chest. -possible lung infection -L ankle OA, R knee arthrosis -anemia, anxiety, depression, HTN -Scarring on liver--Pt states for now the liver is ok    PAIN:  L knee:  6/10 current, 7/10 worst Pt denies pain in R knee.  PRECAUTIONS: Other: metastatic CA   WEIGHT BEARING RESTRICTIONS: No  FALLS:  Has patient fallen in last 6 months? No  LIVING ENVIRONMENT: Lives with: lives with their spouse Lives in: 3 story home Stairs: yes   PLOF: Independent  PATIENT GOALS: walk and climb stairs normal, improve strength.  L knee to function as R knee   OBJECTIVE:  Note: Objective measures were completed at Evaluation unless otherwise noted.  DIAGNOSTIC  FINDINGS: L Tib/Fib X ray on 12/07/23: FINDINGS: No acute fracture or dislocation. Os trigonum. Moderate tricompartmental osteoarthritis of the knee, most severe in the patellofemoral compartment. Mild osteoarthritis of the ankle. Soft tissues are unremarkable.   IMPRESSION: 1. No acute fracture or dislocation. 2. Osteoarthritis of the knee and ankle, as above.  Knees x ray in 2021: IMPRESSION: 1. No fracture or dislocation of the bilateral knees. 2. There is bilateral arthrosis of the knees, most significant in the patellofemoral compartments and worse on the right, with relatively preserved femorotibial joint spaces. 3. There are large calcified loose bodies bilaterally, larger and more numerous on the right.  4. Small bilateral knee joint effusions.  CT on 11/14: IMPRESSION: 1. Stable sclerotic metastatic osseous disease without new or progressive findings or pathologic fracture. 2. Patchy tree-in-bud nodularity in the right middle lobe, suggestive of inflammatory process or atypical infection such as nontuberculous mycobacterial infection. 3. Liver morphology suggestive of cirrhosis without focal hepatic lesion. 4. No findings for metastatic disease soft tissue disease involving the chest abdomen or pelvis.  PATIENT SURVEYS:  LEFS:  40/80 (Eval) LEFS:  22/80 (03/21/24)  COGNITION: Overall cognitive status: Within functional limits for tasks assessed        LOWER EXTREMITY ROM:  AROM Right eval Left eval Left 1/8 Left  Hip flexion      Hip extension      Hip abduction      Hip adduction      Hip internal rotation      Hip external rotation      Knee flexion 134 111  106  Knee extension 3/0 AROM/PROM 10/7 AROM/PROM in supine; Lacking 20 deg with LAQ 11/7 AROM/PROM in supine 8/6 AROM/PROM in supine; Lacking 19 deg with LAQ  Ankle dorsiflexion      Ankle plantarflexion      Ankle inversion      Ankle eversion       (Blank rows = not tested)  LOWER EXTREMITY  MMT:  MMT Right eval Left eval Right 12/9 Left 12/9 Right 1/19 Left 1/19  Hip flexion 5/5 4+/5 with pain    4/5 with min pain  Hip extension        Hip abduction Dificulty with correct form including keeping knee straight but tolerates good resistance <3/5 29.4 24.4 31.3 30.8  Hip adduction        Hip internal rotation        Hip external rotation        Knee flexion 5/5 seated 5/5 seated      Knee extension 5/5 4-/5 with pain    4-/5 with pain  Ankle dorsiflexion        Ankle plantarflexion        Ankle inversion        Ankle eversion         (Blank rows = not tested)   FUNCTIONAL TESTS:  5x STS test: 22.6 sec with UE support.  Pt stopped at 4 reps due to knee pain.  GAIT: Assistive device utilized: None Level of assistance: Complete Independence Comments: Pt has significant genu varus at L knee.  Decreased L knee extension, L toe off, and foot clearance.  Slow gait speed.  3-5/10 pain in L knee with walking in hallway.                                                                                                                                 TREATMENT:    Assessed knee ROM, LE strength, and 5x STS test.   See above. Pt completed LEFS.  See above.  Nustep bilat UE/LE's lvl 4 x 5 mins   Supine quad set with ball under heel 2x10 reps with 5 sec hold Pt received L knee flexion and extension PROM in supine per pt tolerance. Pt received gentle manual knee extension stretch  LAQ 2x10 TKE with YTB x10, 6 Sit to stands with hands on knees x 3 reps at 27 inch height Standing f/b weight shifts in staggered stance with UE support x 5 reps     PATIENT EDUCATION:  Education details: dx, exercise form, HEP, rationale of interventions, relevant anatomy, and POC.  PT answered pt's questions. Person educated: Patient Education method: Explanation, demonstration, verbal and tactile cues, handout Education comprehension: verbalized understanding and needs further education,  returned demonstration, verbal and tactile cues required  HOME EXERCISE PROGRAM: Pt has a HEP.   Access Code: [email or web address] URL: [email or web address] Date: 02/09/2024 Prepared by: Mose Minerva  Exercises - Supine Bridge  - 1 x daily - 7 x weekly - 2 sets - 10 reps - Seated Heel Slide  - 2 x daily - 7 x weekly - 2 sets - 10 reps - Seated Hamstring Stretch  - 2 x daily - 7 x weekly - 2 reps - 20 seconds hold - Seated Long Arc Quad  - 1 x daily - 7 x weekly - 2 sets - 10 reps - Supine Knee Extension Stretch on Towel Roll  - 2-3 x daily - 7 x weekly - 1 reps - 2 minutes hold - Supine Heel Slide with Strap  - 2 x daily - 7 x weekly - 1 sets - 10 reps - Seated Hip Abduction with Resistance  - 1 x daily - 4-5 x weekly - 2 sets - 10 reps   ASSESSMENT:  CLINICAL IMPRESSION: Pt is progressing with exercises slowly though continues to have pain with exercises.  Pt was limited with LAQ with theraband due to pain.  PT tried LAQ with a 1# weight though pt was unable to perform.  PT performed exercises focused on quad strength, knee extension ROM, Wb'ing, and functional strengthening.  Pt continues to have pain and difficulty with sit to stands.  PT measured height of table to determine a good height for pt to perform with reduced pain, yet be challenged and also for progression.  Pt only able to perform 5 reps on weight shifts due to pain.  Pt reports increased pain from 6/10 before treatment to 7-8/10 after treatment.  PT to perform PN next visit.   Worst pain has improved from 9/10 to 7/10. Pt was weaker in L hip flexion and had no change in L knee extension strength.  Pt has improved strength in bilat hip abduction.  Pt demonstrates clinically significant worsened self perceived disability decreasing from 40/80 to 22/80.     OBJECTIVE IMPAIRMENTS: Abnormal gait, decreased activity tolerance, decreased endurance, decreased mobility, difficulty walking, decreased ROM, decreased strength,  hypomobility, impaired flexibility, and pain.   ACTIVITY LIMITATIONS: carrying, bending, standing, squatting, stairs, transfers, and locomotion level  PARTICIPATION LIMITATIONS: cleaning, shopping, and community activity  PERSONAL FACTORS: 3+ comorbidities: metastatic CA, anemia, L ankle OA are also affecting patient's functional outcome.   REHAB POTENTIAL: Good  CLINICAL DECISION MAKING: Evolving/moderate complexity  EVALUATION COMPLEXITY: Moderate   GOALS:  SHORT TERM GOALS: Target date: 03/01/2024  Pt will be independent and compliant with HEP for improved pain, strength, ROM, and function. Baseline: Goal status: GOAL MET  1/8  2.  Pt will demonstrate improved time  on the 5x STS test by at least 5 seconds for improved functional LE strength and performance of transfers.  Baseline:  Goal status: INITIAL  3.  Pt will demonstrate improved L knee AROM by at least 5 degrees in both flexion and extension for improved stiffness, mobility, and gait.  Baseline:  Goal status: NOT MET  1/8  4.  Pt will demo at least a 10 deg improvement with seated LAQ for improved quad strength and knee mobility.  Baseline:  Goal status: NOT MET  1/19  5.  Pt will report at least a 25% improvement overall in pain and daily mobility.  Baseline:  Goal status:  GOAL MET  1/19    LONG TERM GOALS: Target date: 04/25/2024  Pt will demo a reciprocal gait on the stairs with the rail and report he is able to perform his stairs at home without difficulty.  Baseline:  Goal status: INITIAL  2.  Pt will report he is able to perform extended community ambulation without significant difficulty and pain.  Baseline:  Goal status: INITIAL  3.  Pt will report at least a 60% improvement overall in his knee pain and daily mobility.  Baseline:  Goal status: INITIAL  4.  Pt will demo improved L hip flexion and knee extension strength to 5/5 MMT for improved performance of and tolerance with functional  mobility.  (May add hip abd goal after testing next visit) Baseline:  Goal status: INITIAL  5.  Pt will perform the 5x STS test in < than 16 seconds for improved functional LE strength and performance of transfers Baseline:  Goal status: INITIAL     PLAN:  PT FREQUENCY:1-2x/wk  PT DURATION: 5 weeks  PLANNED INTERVENTIONS: [id number]- PT Re-evaluation, 97750- Physical Performance Testing, 97110-Therapeutic exercises, 97530- Therapeutic activity, [id number]- Neuromuscular re-education, 97535- Self Care, [id number]- Manual therapy, [id number]- Gait training, [phone]- Aquatic Therapy, Patient/Family education, Balance training, Stair training, Taping, and Cryotherapy  PLAN FOR NEXT SESSION: calf and HS stretching.  LE strengthening.  Work on [employer].  Sit to stands.  Gait. Knee extension ROM.    Leigh Minerva III PT, DPT 03/22/24 8:21 PM          "

## 2024-03-22 ENCOUNTER — Other Ambulatory Visit (HOSPITAL_COMMUNITY): Payer: Self-pay

## 2024-03-22 ENCOUNTER — Other Ambulatory Visit: Payer: Self-pay

## 2024-03-22 NOTE — Progress Notes (Signed)
 Specialty Pharmacy Refill Coordination Note  Philip [name] is a 79 y.o. male contacted today regarding refills of specialty medication(s) Enzalutamide  (XTANDI )   Patient requested [name] at [facility] at [place] date: 03/24/24   Medication will be filled on: 03/23/24

## 2024-03-23 ENCOUNTER — Ambulatory Visit (HOSPITAL_BASED_OUTPATIENT_CLINIC_OR_DEPARTMENT_OTHER): Admitting: Physical Therapy

## 2024-03-23 ENCOUNTER — Other Ambulatory Visit: Payer: Self-pay

## 2024-03-23 ENCOUNTER — Encounter (HOSPITAL_BASED_OUTPATIENT_CLINIC_OR_DEPARTMENT_OTHER): Payer: Self-pay | Admitting: Physical Therapy

## 2024-03-23 DIAGNOSIS — M6281 Muscle weakness (generalized): Secondary | ICD-10-CM

## 2024-03-23 DIAGNOSIS — R262 Difficulty in walking, not elsewhere classified: Secondary | ICD-10-CM

## 2024-03-23 DIAGNOSIS — M25662 Stiffness of left knee, not elsewhere classified: Secondary | ICD-10-CM

## 2024-03-23 DIAGNOSIS — M25562 Pain in left knee: Secondary | ICD-10-CM

## 2024-03-23 NOTE — Therapy (Signed)
 " OUTPATIENT PHYSICAL THERAPY LOWER EXTREMITY TREATMENT   Patient Name: Philip [name] MRN: [id number] DOB:August 12, 1945, 79 y.o., male Today's Date: 03/24/2024  END OF SESSION:  PT End of Session - 03/23/24 1501     Visit Number 11    Number of Visits 20    Date for Recertification  04/25/24    Authorization Type MCR    PT Start Time 1454    PT Stop Time 1534    PT Time Calculation (min) 40 min    Activity Tolerance Patient tolerated treatment well    Behavior During Therapy [facility] for tasks assessed/performed              Past Medical History:  Diagnosis Date   Allergy    Anxiety    Arthritis    Atherosclerosis of aorta 03/11/2017   The 10-year ASCVD risk score Verdon DC Jr., et al., 2013) is: 24.6%   Values used to calculate the score:     Age: 78 years     Sex: Male     Is Non-Hispanic African American: No     Diabetic: No     Tobacco smoker: No     Systolic Blood Pressure: 154 mmHg     Is BP treated: No     HDL Cholesterol: 71.1 mg/dL     Total Cholesterol: 212 mg/dL   Cancer (HCC)    PROSTATE   Cataract    Chronic idiopathic constipation 03/11/2017   Depression    Edema leg 11/29/2014   left leg from foot to thigh, increasinly worse over last 4 weeks   Essential hypertension 03/30/2018   Gastroesophageal reflux disease without esophagitis 03/11/2017   Hyperlipidemia LDL goal <100 03/16/2017   The 10-year ASCVD risk score Verdon DC Jr., et al., 2013) is: 30.6%   Values used to calculate the score:     Age: 60 years     Sex: Male     Is Non-Hispanic African American: No     Diabetic: No     Tobacco smoker: No     Systolic Blood Pressure: 148 mmHg     Is BP treated: Yes     HDL Cholesterol: 69.3 mg/dL     Total Cholesterol: 211 mg/dL   Hypertension    Hypoglycemia    Swelling LAST 30 DAYS DR [facility] AWARE   LEFT LEG AND FOOT   Past Surgical History:  Procedure Laterality Date   BOWEL DECOMPRESSION N/A 03/07/2021   Procedure: BOWEL DECOMPRESSION;  Surgeon: Leigh Elspeth SQUIBB, MD;  Location: [facility];  Service: Gastroenterology;  Laterality: N/A;   BOWEL DECOMPRESSION N/A 06/07/2021   Procedure: BOWEL DECOMPRESSION;  Surgeon: Rollin Dover, MD;  Location: [facility];  Service: Gastroenterology;  Laterality: N/A;   COLON SURGERY     FLEXIBLE SIGMOIDOSCOPY N/A 03/07/2021   Procedure: FLEXIBLE SIGMOIDOSCOPY;  Surgeon: Leigh Elspeth SQUIBB, MD;  Location: [facility];  Service: Gastroenterology;  Laterality: N/A;   FLEXIBLE SIGMOIDOSCOPY N/A 06/07/2021   Procedure: FLEXIBLE SIGMOIDOSCOPY;  Surgeon: Rollin Dover, MD;  Location: [facility];  Service: Gastroenterology;  Laterality: N/A;   GUM SURGERY     TEETH IMPLANTS ALSO   LAPAROSCOPIC SIGMOID COLECTOMY N/A 06/10/2021   Procedure: LAPAROSCOPIC ASSISTED SIGMOID COLECTOMY;  Surgeon: Gladis Cough, MD;  Location: [facility];  Service: General;  Laterality: N/A;   ORCHIECTOMY Bilateral 01/03/2015   Procedure: ORCHIECTOMY;  Surgeon: Ricardo Likens, MD;  Location: [facility];  Service: Urology;  Laterality: Bilateral;   Patient Active Problem List  Diagnosis Date Noted   Primary osteoarthritis of both knees 12/12/2023   Need for immunization against influenza 12/07/2023   Anemia due to zinc  deficiency 10/08/2022   Iron deficiency anemia 03/07/2021   Tinnitus of both ears 09/07/2019   Laryngopharyngeal reflux (LPR) 09/07/2019   Essential hypertension 03/30/2018   Port-A-Cath in place 08/25/2017   Hyperlipidemia LDL goal <100 03/16/2017   Atherosclerosis of aorta 03/11/2017   Chronic idiopathic constipation 03/11/2017   Seasonal allergic rhinitis due to pollen 03/11/2017   Gastroesophageal reflux disease without esophagitis 03/11/2017   Malignant neoplasm of bone (HCC) 10/16/2015   Prostate cancer (HCC) 02/02/2015    PCP: Joshua Debby CROME, MD   REFERRING PROVIDER: Joshua Debby CROME, MD   REFERRING DIAG: M17.0 (ICD-10-CM) - Primary osteoarthritis of both knees   THERAPY DIAG:  Left knee pain, unspecified  chronicity  Muscle weakness (generalized)  Difficulty in walking, not elsewhere classified  Stiffness of left knee, not elsewhere classified  Stiffness of left knee, not elsewhere classified  Rationale for Evaluation and Treatment: Rehabilitation  ONSET DATE: PT order 12/12/23  SUBJECTIVE:   SUBJECTIVE STATEMENT: Feels stiff today.  Pt reports 6.5-7/10 pain with walking.  Pt states he performed his exercises this AM.       PERTINENT HISTORY: -Prostate CA with metastatic spread to lymph nodes and bone (dx 2016)--Pt is on oral chemotherapy.  Pt is followed by Dr. Federico and gets an annual CT scan.  Pt states his CA is not active.  Port-a-cath located on R side of chest. -possible lung infection -L ankle OA, R knee arthrosis -anemia, anxiety, depression, HTN -Scarring on liver--Pt states for now the liver is ok    PAIN:  L knee:  6/10 current, 7/10 worst Pt denies pain in R knee.  PRECAUTIONS: Other: metastatic CA   WEIGHT BEARING RESTRICTIONS: No  FALLS:  Has patient fallen in last 6 months? No  LIVING ENVIRONMENT: Lives with: lives with their spouse Lives in: 3 story home Stairs: yes   PLOF: Independent  PATIENT GOALS: walk and climb stairs normal, improve strength.  L knee to function as R knee   OBJECTIVE:  Note: Objective measures were completed at Evaluation unless otherwise noted.  DIAGNOSTIC FINDINGS: L Tib/Fib X ray on 12/07/23: FINDINGS: No acute fracture or dislocation. Os trigonum. Moderate tricompartmental osteoarthritis of the knee, most severe in the patellofemoral compartment. Mild osteoarthritis of the ankle. Soft tissues are unremarkable.   IMPRESSION: 1. No acute fracture or dislocation. 2. Osteoarthritis of the knee and ankle, as above.  Knees x ray in 2021: IMPRESSION: 1. No fracture or dislocation of the bilateral knees. 2. There is bilateral arthrosis of the knees, most significant in the patellofemoral compartments and  worse on the right, with relatively preserved femorotibial joint spaces. 3. There are large calcified loose bodies bilaterally, larger and more numerous on the right. 4. Small bilateral knee joint effusions.  CT on 11/14: IMPRESSION: 1. Stable sclerotic metastatic osseous disease without new or progressive findings or pathologic fracture. 2. Patchy tree-in-bud nodularity in the right middle lobe, suggestive of inflammatory process or atypical infection such as nontuberculous mycobacterial infection. 3. Liver morphology suggestive of cirrhosis without focal hepatic lesion. 4. No findings for metastatic disease soft tissue disease involving the chest abdomen or pelvis.  PATIENT SURVEYS:  LEFS:  40/80 (Eval) LEFS:  22/80 (03/21/24)  COGNITION: Overall cognitive status: Within functional limits for tasks assessed        LOWER EXTREMITY ROM:  AROM Right eval  Left eval Left 1/8 Left  Hip flexion      Hip extension      Hip abduction      Hip adduction      Hip internal rotation      Hip external rotation      Knee flexion 134 111  106  Knee extension 3/0 AROM/PROM 10/7 AROM/PROM in supine; Lacking 20 deg with LAQ 11/7 AROM/PROM in supine 8/6 AROM/PROM in supine; Lacking 19 deg with LAQ  Ankle dorsiflexion      Ankle plantarflexion      Ankle inversion      Ankle eversion       (Blank rows = not tested)  LOWER EXTREMITY MMT:  MMT Right eval Left eval Right 12/9 Left 12/9 Right 1/19 Left 1/19  Hip flexion 5/5 4+/5 with pain    4/5 with min pain  Hip extension        Hip abduction Dificulty with correct form including keeping knee straight but tolerates good resistance <3/5 29.4 24.4 31.3 30.8  Hip adduction        Hip internal rotation        Hip external rotation        Knee flexion 5/5 seated 5/5 seated      Knee extension 5/5 4-/5 with pain    4-/5 with pain  Ankle dorsiflexion        Ankle plantarflexion        Ankle inversion        Ankle eversion          (Blank rows = not tested)   FUNCTIONAL TESTS:  5x STS test: 22.6 sec with UE support.  Pt stopped at 4 reps due to knee pain.  GAIT: Assistive device utilized: None Level of assistance: Complete Independence Comments: Pt has significant genu varus at L knee.  Decreased L knee extension, L toe off, and foot clearance.  Slow gait speed.  3-5/10 pain in L knee with walking in hallway.                                                                                                                                 TREATMENT:    Nustep bilat UE/LE's lvl 4 x 5 mins   Supine quad set with ball under knee 2x10 reps with 5 sec hold Pt received L knee flexion and extension PROM in supine per pt tolerance. Pt received gentle manual knee extension stretch  LAQ 2x10 Longsitting gastroc stretch 3x20-30 sec Weight shifts on airex f/b in staggered stance and s/s with bilat UE support x 10 reps  Seated knee extension stretch with heel propped on stool x 2 mins   PATIENT EDUCATION:  Education details: dx, exercise form, HEP, rationale of interventions, relevant anatomy, and POC.  PT answered pt's questions. Person educated: Patient Education method: Explanation, demonstration, verbal and tactile cues, handout Education comprehension: verbalized understanding and needs further education,  returned demonstration, verbal and tactile cues required  HOME EXERCISE PROGRAM: Pt has a HEP.   Access Code: [email or web address] URL: [email or web address] Date: 02/09/2024 Prepared by: Mose Minerva  Exercises - Supine Bridge  - 1 x daily - 7 x weekly - 2 sets - 10 reps - Seated Heel Slide  - 2 x daily - 7 x weekly - 2 sets - 10 reps - Seated Hamstring Stretch  - 2 x daily - 7 x weekly - 2 reps - 20 seconds hold - Seated Long Arc Quad  - 1 x daily - 7 x weekly - 2 sets - 10 reps - Supine Knee Extension Stretch on Towel Roll  - 2-3 x daily - 7 x weekly - 1 reps - 2 minutes hold - Supine Heel Slide with  Strap  - 2 x daily - 7 x weekly - 1 sets - 10 reps - Seated Hip Abduction with Resistance  - 1 x daily - 4-5 x weekly - 2 sets - 10 reps   ASSESSMENT:  CLINICAL IMPRESSION: Pt states it's slowly improving and reports a 25% improvement in pain and daily mobility.  His worst pain has improved from 9/10 to 7/10.  Pt has increased pain with performing stairs including his garage steps.  He continues to have sx's of his knee wanting to lock up when he stands up after sitting for awhile.  Pt was weaker in L hip flexion and had no change in L knee extension strength.  Pt has improved strength in bilat hip abduction.  He continues to be limited with extension ROM and demonstrates slight improvement.  He has difficulty with sit to stands and didn't complete the 5x STS test due to knee pain.  He is limited with exercises due to pain.  Pt demonstrates clinically significant worsened self perceived disability decreasing from 40/80 to 22/80.  Pt has limited progress toward goals though did meet STG's #1 and 5.  Pt may benefit from continued skilled PT to address ongoing goals and impairments and to maximize functional mobility.   Standing is better 5/10 pain, walking is about the same     OBJECTIVE IMPAIRMENTS: Abnormal gait, decreased activity tolerance, decreased endurance, decreased mobility, difficulty walking, decreased ROM, decreased strength, hypomobility, impaired flexibility, and pain.   ACTIVITY LIMITATIONS: carrying, bending, standing, squatting, stairs, transfers, and locomotion level  PARTICIPATION LIMITATIONS: cleaning, shopping, and community activity  PERSONAL FACTORS: 3+ comorbidities: metastatic CA, anemia, L ankle OA are also affecting patient's functional outcome.   REHAB POTENTIAL: Good  CLINICAL DECISION MAKING: Evolving/moderate complexity  EVALUATION COMPLEXITY: Moderate   GOALS:  SHORT TERM GOALS: Target date: 03/01/2024  Pt will be independent and compliant with HEP for  improved pain, strength, ROM, and function. Baseline: Goal status: GOAL MET  1/8  2.  Pt will demonstrate improved time on the 5x STS test by at least 5 seconds for improved functional LE strength and performance of transfers.  Baseline:  Goal status:  NOT MET  1/19  3.  Pt will demonstrate improved L knee AROM by at least 5 degrees in both flexion and extension for improved stiffness, mobility, and gait.  Baseline:  Goal status: ONGOING  4.  Pt will demo at least a 10 deg improvement with seated LAQ for improved quad strength and knee mobility.  Baseline:  Goal status: NOT MET  1/19  5.  Pt will report at least a 25% improvement overall in pain and daily mobility.  Baseline:  Goal status:  GOAL MET  1/19    LONG TERM GOALS: Target date: 04/25/2024  Pt will demo a reciprocal gait on the stairs with the rail and report he is able to perform his stairs at home without difficulty.  Baseline:  Goal status: ONGOING  2.  Pt will report he is able to perform extended community ambulation without significant difficulty and pain.  Baseline:  Goal status: ONGOING  3.  Pt will report at least a 60% improvement overall in his knee pain and daily mobility.  Baseline:  Goal status:  ONGOING  4.  Pt will demo improved L hip flexion and knee extension strength to 5/5 MMT for improved performance of and tolerance with functional mobility.  (May add hip abd goal after testing next visit) Baseline:  Goal status: NOT MET    5.  Pt will perform the 5x STS test in < than 16 seconds for improved functional LE strength and performance of transfers Baseline:  Goal status: NOT MET      PLAN:  PT FREQUENCY:1-2x/wk  PT DURATION: 5 weeks  PLANNED INTERVENTIONS: [id number]- PT Re-evaluation, 97750- Physical Performance Testing, 97110-Therapeutic exercises, 97530- Therapeutic activity, [id number]- Neuromuscular re-education, 97535- Self Care, [id number]- Manual therapy, [id number]- Gait training, [phone]- Aquatic  Therapy, Patient/Family education, Balance training, Stair training, Taping, and Cryotherapy  PLAN FOR NEXT SESSION: calf and HS stretching.  LE strengthening.  Work on [employer].  Sit to stands.  Gait. Knee extension ROM.    Leigh Minerva III PT, DPT 03/24/24 9:45 PM           "

## 2024-03-24 ENCOUNTER — Encounter: Payer: Self-pay | Admitting: Hematology and Oncology

## 2024-03-28 ENCOUNTER — Encounter (HOSPITAL_BASED_OUTPATIENT_CLINIC_OR_DEPARTMENT_OTHER): Payer: Self-pay

## 2024-03-28 ENCOUNTER — Ambulatory Visit (HOSPITAL_BASED_OUTPATIENT_CLINIC_OR_DEPARTMENT_OTHER): Admitting: Physical Therapy

## 2024-03-30 ENCOUNTER — Encounter (HOSPITAL_BASED_OUTPATIENT_CLINIC_OR_DEPARTMENT_OTHER): Payer: Self-pay | Admitting: Physical Therapy

## 2024-03-30 ENCOUNTER — Ambulatory Visit (HOSPITAL_BASED_OUTPATIENT_CLINIC_OR_DEPARTMENT_OTHER): Admitting: Physical Therapy

## 2024-03-30 DIAGNOSIS — M25562 Pain in left knee: Secondary | ICD-10-CM

## 2024-03-30 DIAGNOSIS — M25662 Stiffness of left knee, not elsewhere classified: Secondary | ICD-10-CM

## 2024-03-30 DIAGNOSIS — M6281 Muscle weakness (generalized): Secondary | ICD-10-CM

## 2024-03-30 DIAGNOSIS — R262 Difficulty in walking, not elsewhere classified: Secondary | ICD-10-CM

## 2024-03-30 NOTE — Therapy (Signed)
 " OUTPATIENT PHYSICAL THERAPY LOWER EXTREMITY TREATMENT   Patient Name: Philip [name] MRN: [id number] DOB:01-10-1946, 79 y.o., male Today's Date: 03/30/2024  END OF SESSION:  PT End of Session - 03/30/24 1353     Visit Number 12    Number of Visits 20    Date for Recertification  04/25/24    PT Start Time 1317    PT Stop Time 1400    PT Time Calculation (min) 43 min    Activity Tolerance Patient tolerated treatment well    Behavior During Therapy [facility] for tasks assessed/performed               Past Medical History:  Diagnosis Date   Allergy    Anxiety    Arthritis    Atherosclerosis of aorta 03/11/2017   The 10-year ASCVD risk score Verdon DC Jr., et al., 2013) is: 24.6%   Values used to calculate the score:     Age: 38 years     Sex: Male     Is Non-Hispanic African American: No     Diabetic: No     Tobacco smoker: No     Systolic Blood Pressure: 154 mmHg     Is BP treated: No     HDL Cholesterol: 71.1 mg/dL     Total Cholesterol: 212 mg/dL   Cancer (HCC)    PROSTATE   Cataract    Chronic idiopathic constipation 03/11/2017   Depression    Edema leg 11/29/2014   left leg from foot to thigh, increasinly worse over last 4 weeks   Essential hypertension 03/30/2018   Gastroesophageal reflux disease without esophagitis 03/11/2017   Hyperlipidemia LDL goal <100 03/16/2017   The 10-year ASCVD risk score Verdon DC Jr., et al., 2013) is: 30.6%   Values used to calculate the score:     Age: 63 years     Sex: Male     Is Non-Hispanic African American: No     Diabetic: No     Tobacco smoker: No     Systolic Blood Pressure: 148 mmHg     Is BP treated: Yes     HDL Cholesterol: 69.3 mg/dL     Total Cholesterol: 211 mg/dL   Hypertension    Hypoglycemia    Swelling LAST 30 DAYS DR [facility] AWARE   LEFT LEG AND FOOT   Past Surgical History:  Procedure Laterality Date   BOWEL DECOMPRESSION N/A 03/07/2021   Procedure: BOWEL DECOMPRESSION;  Surgeon: Leigh Elspeth SQUIBB, MD;  Location: [facility]  ENDOSCOPY;  Service: Gastroenterology;  Laterality: N/A;   BOWEL DECOMPRESSION N/A 06/07/2021   Procedure: BOWEL DECOMPRESSION;  Surgeon: Rollin Dover, MD;  Location: [facility] ENDOSCOPY;  Service: Gastroenterology;  Laterality: N/A;   COLON SURGERY     FLEXIBLE SIGMOIDOSCOPY N/A 03/07/2021   Procedure: FLEXIBLE SIGMOIDOSCOPY;  Surgeon: Leigh Elspeth SQUIBB, MD;  Location: [facility] ENDOSCOPY;  Service: Gastroenterology;  Laterality: N/A;   FLEXIBLE SIGMOIDOSCOPY N/A 06/07/2021   Procedure: FLEXIBLE SIGMOIDOSCOPY;  Surgeon: Rollin Dover, MD;  Location: [facility] ENDOS[facility];  Service: Gastroenterology;  Laterality: N/A;   GUM SURGERY     TEETH IMPLANTS ALSO   LAPAROSCOPIC SIGMOID COLECTOMY N/A 06/10/2021   Procedure: LAPAROSCOPIC ASSISTED SIGMOID COLECTOMY;  Surgeon: Gladis Cough, MD;  Location: [facility] ORS;  Service: General;  Laterality: N/A;   ORCHIECTOMY Bilateral 01/03/2015   Procedure: ORCHIECTOMY;  Surgeon: Ricardo Likens, MD;  Location: [facility] ORS;  Service: Urology;  Laterality: Bilateral;   Patient Active Problem List   Diagnosis Date Noted  Primary osteoarthritis of both knees 12/12/2023   Need for immunization against influenza 12/07/2023   Anemia due to zinc  deficiency 10/08/2022   Iron deficiency anemia 03/07/2021   Tinnitus of both ears 09/07/2019   Laryngopharyngeal reflux (LPR) 09/07/2019   Essential hypertension 03/30/2018   Port-A-Cath in place 08/25/2017   Hyperlipidemia LDL goal <100 03/16/2017   Atherosclerosis of aorta 03/11/2017   Chronic idiopathic constipation 03/11/2017   Seasonal allergic rhinitis due to pollen 03/11/2017   Gastroesophageal reflux disease without esophagitis 03/11/2017   Malignant neoplasm of bone (HCC) 10/16/2015   Prostate cancer (HCC) 02/02/2015    PCP: Joshua Debby CROME, MD   REFERRING PROVIDER: Joshua Debby CROME, MD   REFERRING DIAG: M17.0 (ICD-10-CM) - Primary osteoarthritis of both knees   THERAPY DIAG:  Left knee pain, unspecified chronicity  Muscle  weakness (generalized)  Difficulty in walking, not elsewhere classified  Stiffness of left knee, not elsewhere classified  Stiffness of left knee, not elsewhere classified  Rationale for Evaluation and Treatment: Rehabilitation  ONSET DATE: PT order 12/12/23  SUBJECTIVE:   SUBJECTIVE STATEMENT: Pt states he did his home exercises yesterday and felt good.  2-3 hours later, the posterior aspect of his knee felt like it wanted to lock up.  Pt denies any adverse effects after prior treatment.  Pt had a CT follow up which showed that he doesn't have a lung infection.       PERTINENT HISTORY: -Prostate CA with metastatic spread to lymph nodes and bone (dx 2016)--Pt is on oral chemotherapy.  Pt is followed by Dr. Federico and gets an annual CT scan.  Pt states his CA is not active.  Port-a-cath located on R side of chest. -possible lung infection -L ankle OA, R knee arthrosis -anemia, anxiety, depression, HTN -Scarring on liver--Pt states for now the liver is ok    PAIN:  L knee:  5/10 current, 7/10 worst Pt denies pain in R knee.  PRECAUTIONS: Other: metastatic CA   WEIGHT BEARING RESTRICTIONS: No  FALLS:  Has patient fallen in last 6 months? No  LIVING ENVIRONMENT: Lives with: lives with their spouse Lives in: 3 story home Stairs: yes   PLOF: Independent  PATIENT GOALS: walk and climb stairs normal, improve strength.  L knee to function as R knee   OBJECTIVE:  Note: Objective measures were completed at Evaluation unless otherwise noted.  DIAGNOSTIC FINDINGS: L Tib/Fib X ray on 12/07/23: FINDINGS: No acute fracture or dislocation. Os trigonum. Moderate tricompartmental osteoarthritis of the knee, most severe in the patellofemoral compartment. Mild osteoarthritis of the ankle. Soft tissues are unremarkable.   IMPRESSION: 1. No acute fracture or dislocation. 2. Osteoarthritis of the knee and ankle, as above.  Knees x ray in 2021: IMPRESSION: 1. No fracture or  dislocation of the bilateral knees. 2. There is bilateral arthrosis of the knees, most significant in the patellofemoral compartments and worse on the right, with relatively preserved femorotibial joint spaces. 3. There are large calcified loose bodies bilaterally, larger and more numerous on the right. 4. Small bilateral knee joint effusions.  CT on 11/14: IMPRESSION: 1. Stable sclerotic metastatic osseous disease without new or progressive findings or pathologic fracture. 2. Patchy tree-in-bud nodularity in the right middle lobe, suggestive of inflammatory process or atypical infection such as nontuberculous mycobacterial infection. 3. Liver morphology suggestive of cirrhosis without focal hepatic lesion. 4. No findings for metastatic disease soft tissue disease involving the chest abdomen or pelvis.  PATIENT SURVEYS:  LEFS:  40/80 (Eval)  LEFS:  22/80 (03/21/24)  COGNITION: Overall cognitive status: Within functional limits for tasks assessed        LOWER EXTREMITY ROM:  AROM Right eval Left eval Left 1/8 Left  Hip flexion      Hip extension      Hip abduction      Hip adduction      Hip internal rotation      Hip external rotation      Knee flexion 134 111  106  Knee extension 3/0 AROM/PROM 10/7 AROM/PROM in supine; Lacking 20 deg with LAQ 11/7 AROM/PROM in supine 8/6 AROM/PROM in supine; Lacking 19 deg with LAQ  Ankle dorsiflexion      Ankle plantarflexion      Ankle inversion      Ankle eversion       (Blank rows = not tested)  LOWER EXTREMITY MMT:  MMT Right eval Left eval Right 12/9 Left 12/9 Right 1/19 Left 1/19  Hip flexion 5/5 4+/5 with pain    4/5 with min pain  Hip extension        Hip abduction Dificulty with correct form including keeping knee straight but tolerates good resistance <3/5 29.4 24.4 31.3 30.8  Hip adduction        Hip internal rotation        Hip external rotation        Knee flexion 5/5 seated 5/5 seated      Knee extension  5/5 4-/5 with pain    4-/5 with pain  Ankle dorsiflexion        Ankle plantarflexion        Ankle inversion        Ankle eversion         (Blank rows = not tested)   FUNCTIONAL TESTS:  5x STS test: 22.6 sec with UE support.  Pt stopped at 4 reps due to knee pain.  GAIT: Assistive device utilized: None Level of assistance: Complete Independence Comments: Pt has significant genu varus at L knee.  Decreased L knee extension, L toe off, and foot clearance.  Slow gait speed.  3-5/10 pain in L knee with walking in hallway.                                                                                                                                 TREATMENT:    Nustep bilat UE/LE's lvl 4 x 5 mins   Supine quad set with ball under knee 2x10 reps with 5 sec hold Pt received L knee flexion and extension PROM in supine per pt tolerance. Pt received gentle manual knee extension stretch  LAQ 2x10 TKE with YTB x10, 3 reps Longsitting gastroc stretch 3x20-30 sec Weight shifts on airex f/b in staggered stance and s/s with bilat UE support  Bwd ambulation at rail with SBA Sidestepping at rail    PATIENT EDUCATION:  Education details: dx, exercise form, HEP, rationale of  interventions, relevant anatomy, and POC.  PT answered pt's questions. Person educated: Patient Education method: Explanation, demonstration, verbal and tactile cues, handout Education comprehension: verbalized understanding and needs further education, returned demonstration, verbal and tactile cues required  HOME EXERCISE PROGRAM: Pt has a HEP.   Access Code: [email or web address] URL: [email or web address] Date: 02/09/2024 Prepared by: Mose Minerva  Exercises - Supine Bridge  - 1 x daily - 7 x weekly - 2 sets - 10 reps - Seated Heel Slide  - 2 x daily - 7 x weekly - 2 sets - 10 reps - Seated Hamstring Stretch  - 2 x daily - 7 x weekly - 2 reps - 20 seconds hold - Seated Long Arc Quad  - 1 x daily - 7 x weekly  - 2 sets - 10 reps - Supine Knee Extension Stretch on Towel Roll  - 2-3 x daily - 7 x weekly - 1 reps - 2 minutes hold - Supine Heel Slide with Strap  - 2 x daily - 7 x weekly - 1 sets - 10 reps - Seated Hip Abduction with Resistance  - 1 x daily - 4-5 x weekly - 2 sets - 10 reps   ASSESSMENT:  CLINICAL IMPRESSION: Pt continues to have limitation with knee extension ROM.  Pt tolerated PROM and stretching well.  PT increased closed chain activities today.  He had pain with standing TKE's and weights shifts on airex.  PT instructed pt in performing sidestepping and bwd ambulation at rail for improved gait, Wb'ing, and knee extension.  He responded well to treatment reporting improved pain from 5/10 before treatment to 4/10 after treatment.  Pt may benefit from continued skilled PT to address ongoing goals and impairments and to maximize functional mobility.      OBJECTIVE IMPAIRMENTS: Abnormal gait, decreased activity tolerance, decreased endurance, decreased mobility, difficulty walking, decreased ROM, decreased strength, hypomobility, impaired flexibility, and pain.   ACTIVITY LIMITATIONS: carrying, bending, standing, squatting, stairs, transfers, and locomotion level  PARTICIPATION LIMITATIONS: cleaning, shopping, and community activity  PERSONAL FACTORS: 3+ comorbidities: metastatic CA, anemia, L ankle OA are also affecting patient's functional outcome.   REHAB POTENTIAL: Good  CLINICAL DECISION MAKING: Evolving/moderate complexity  EVALUATION COMPLEXITY: Moderate   GOALS:  SHORT TERM GOALS: Target date: 03/01/2024  Pt will be independent and compliant with HEP for improved pain, strength, ROM, and function. Baseline: Goal status: GOAL MET  1/8  2.  Pt will demonstrate improved time on the 5x STS test by at least 5 seconds for improved functional LE strength and performance of transfers.  Baseline:  Goal status:  NOT MET  1/19  3.  Pt will demonstrate improved L knee AROM  by at least 5 degrees in both flexion and extension for improved stiffness, mobility, and gait.  Baseline:  Goal status: ONGOING  4.  Pt will demo at least a 10 deg improvement with seated LAQ for improved quad strength and knee mobility.  Baseline:  Goal status: NOT MET  1/19  5.  Pt will report at least a 25% improvement overall in pain and daily mobility.  Baseline:  Goal status:  GOAL MET  1/19    LONG TERM GOALS: Target date: 04/25/2024  Pt will demo a reciprocal gait on the stairs with the rail and report he is able to perform his stairs at home without difficulty.  Baseline:  Goal status: ONGOING  2.  Pt will report he is able to perform extended community ambulation without significant difficulty and  pain.  Baseline:  Goal status: ONGOING  3.  Pt will report at least a 60% improvement overall in his knee pain and daily mobility.  Baseline:  Goal status:  ONGOING  4.  Pt will demo improved L hip flexion and knee extension strength to 5/5 MMT for improved performance of and tolerance with functional mobility.  (May add hip abd goal after testing next visit) Baseline:  Goal status: NOT MET    5.  Pt will perform the 5x STS test in < than 16 seconds for improved functional LE strength and performance of transfers Baseline:  Goal status: NOT MET      PLAN:  PT FREQUENCY:1-2x/wk  PT DURATION: 5 weeks  PLANNED INTERVENTIONS: [id number]- PT Re-evaluation, 97750- Physical Performance Testing, 97110-Therapeutic exercises, 97530- Therapeutic activity, [id number]- Neuromuscular re-education, 97535- Self Care, [id number]- Manual therapy, [id number]- Gait training, [phone]- Aquatic Therapy, Patient/Family education, Balance training, Stair training, Taping, and Cryotherapy  PLAN FOR NEXT SESSION: calf and HS stretching.  LE strengthening.  Work on [employer].  Sit to stands.  Gait. Knee extension ROM.    Leigh Minerva III PT, DPT 03/30/24 2:22 PM             "

## 2024-04-01 ENCOUNTER — Encounter (HOSPITAL_BASED_OUTPATIENT_CLINIC_OR_DEPARTMENT_OTHER): Payer: Self-pay | Admitting: Physical Therapy

## 2024-04-01 ENCOUNTER — Ambulatory Visit (HOSPITAL_BASED_OUTPATIENT_CLINIC_OR_DEPARTMENT_OTHER): Admitting: Physical Therapy

## 2024-04-01 DIAGNOSIS — M6281 Muscle weakness (generalized): Secondary | ICD-10-CM

## 2024-04-01 DIAGNOSIS — M25562 Pain in left knee: Secondary | ICD-10-CM

## 2024-04-01 DIAGNOSIS — M25662 Stiffness of left knee, not elsewhere classified: Secondary | ICD-10-CM

## 2024-04-01 DIAGNOSIS — R262 Difficulty in walking, not elsewhere classified: Secondary | ICD-10-CM

## 2024-04-01 NOTE — Therapy (Signed)
 " OUTPATIENT PHYSICAL THERAPY LOWER EXTREMITY TREATMENT   Patient Name: Philip [name] MRN: [id number] DOB:May 21, 1945, 79 y.o., male Today's Date: 04/01/2024  END OF SESSION:  PT End of Session - 04/01/24 1035     Visit Number 13    Number of Visits 20    Date for Recertification  04/25/24    Authorization Type MCR    PT Start Time 1025    PT Stop Time 1106    PT Time Calculation (min) 41 min    Activity Tolerance Patient limited by pain;Patient tolerated treatment well    Behavior During Therapy [facility] for tasks assessed/performed               Past Medical History:  Diagnosis Date   Allergy    Anxiety    Arthritis    Atherosclerosis of aorta 03/11/2017   The 10-year ASCVD risk score Verdon DC Jr., et al., 2013) is: 24.6%   Values used to calculate the score:     Age: 74 years     Sex: Male     Is Non-Hispanic African American: No     Diabetic: No     Tobacco smoker: No     Systolic Blood Pressure: 154 mmHg     Is BP treated: No     HDL Cholesterol: 71.1 mg/dL     Total Cholesterol: 212 mg/dL   Cancer (HCC)    PROSTATE   Cataract    Chronic idiopathic constipation 03/11/2017   Depression    Edema leg 11/29/2014   left leg from foot to thigh, increasinly worse over last 4 weeks   Essential hypertension 03/30/2018   Gastroesophageal reflux disease without esophagitis 03/11/2017   Hyperlipidemia LDL goal <100 03/16/2017   The 10-year ASCVD risk score Verdon DC Jr., et al., 2013) is: 30.6%   Values used to calculate the score:     Age: 5 years     Sex: Male     Is Non-Hispanic African American: No     Diabetic: No     Tobacco smoker: No     Systolic Blood Pressure: 148 mmHg     Is BP treated: Yes     HDL Cholesterol: 69.3 mg/dL     Total Cholesterol: 211 mg/dL   Hypertension    Hypoglycemia    Swelling LAST 30 DAYS DR [facility] AWARE   LEFT LEG AND FOOT   Past Surgical History:  Procedure Laterality Date   BOWEL DECOMPRESSION N/A 03/07/2021   Procedure: BOWEL DECOMPRESSION;   Surgeon: Leigh Elspeth SQUIBB, MD;  Location: [facility];  Service: Gastroenterology;  Laterality: N/A;   BOWEL DECOMPRESSION N/A 06/07/2021   Procedure: BOWEL DECOMPRESSION;  Surgeon: Rollin Dover, MD;  Location: [facility];  Service: Gastroenterology;  Laterality: N/A;   COLON SURGERY     FLEXIBLE SIGMOIDOSCOPY N/A 03/07/2021   Procedure: FLEXIBLE SIGMOIDOSCOPY;  Surgeon: Leigh Elspeth SQUIBB, MD;  Location: [facility];  Service: Gastroenterology;  Laterality: N/A;   FLEXIBLE SIGMOIDOSCOPY N/A 06/07/2021   Procedure: FLEXIBLE SIGMOIDOSCOPY;  Surgeon: Rollin Dover, MD;  Location: [facility];  Service: Gastroenterology;  Laterality: N/A;   GUM SURGERY     TEETH IMPLANTS ALSO   LAPAROSCOPIC SIGMOID COLECTOMY N/A 06/10/2021   Procedure: LAPAROSCOPIC ASSISTED SIGMOID COLECTOMY;  Surgeon: Gladis Cough, MD;  Location: [facility];  Service: General;  Laterality: N/A;   ORCHIECTOMY Bilateral 01/03/2015   Procedure: ORCHIECTOMY;  Surgeon: Ricardo Likens, MD;  Location: [facility];  Service: Urology;  Laterality: Bilateral;   Patient  Active Problem List   Diagnosis Date Noted   Primary osteoarthritis of both knees 12/12/2023   Need for immunization against influenza 12/07/2023   Anemia due to zinc  deficiency 10/08/2022   Iron deficiency anemia 03/07/2021   Tinnitus of both ears 09/07/2019   Laryngopharyngeal reflux (LPR) 09/07/2019   Essential hypertension 03/30/2018   Port-A-Cath in place 08/25/2017   Hyperlipidemia LDL goal <100 03/16/2017   Atherosclerosis of aorta 03/11/2017   Chronic idiopathic constipation 03/11/2017   Seasonal allergic rhinitis due to pollen 03/11/2017   Gastroesophageal reflux disease without esophagitis 03/11/2017   Malignant neoplasm of bone (HCC) 10/16/2015   Prostate cancer (HCC) 02/02/2015    PCP: Joshua Debby CROME, MD   REFERRING PROVIDER: Joshua Debby CROME, MD   REFERRING DIAG: M17.0 (ICD-10-CM) - Primary osteoarthritis of both knees   THERAPY DIAG:   Left knee pain, unspecified chronicity  Muscle weakness (generalized)  Difficulty in walking, not elsewhere classified  Stiffness of left knee, not elsewhere classified  Stiffness of left knee, not elsewhere classified  Rationale for Evaluation and Treatment: Rehabilitation  ONSET DATE: PT order 12/12/23  SUBJECTIVE:   SUBJECTIVE STATEMENT: Pt denies any adverse effects after prior treatment.  Pt states his L knee feels very tight today.  Pt c/o's of pain with standing and is limited with standing.  Pt reports improved sx's with initially standing up.        PERTINENT HISTORY: -Prostate CA with metastatic spread to lymph nodes and bone (dx 2016)--Pt is on oral chemotherapy.  Pt is followed by Dr. Federico and gets an annual CT scan.  Pt states his CA is not active.  Port-a-cath located on R side of chest. -possible lung infection -L ankle OA, R knee arthrosis -anemia, anxiety, depression, HTN -Scarring on liver--Pt states for now the liver is ok    PAIN:  L knee:  6/10 current, 7/10 worst Pt denies pain in R knee.  PRECAUTIONS: Other: metastatic CA   WEIGHT BEARING RESTRICTIONS: No  FALLS:  Has patient fallen in last 6 months? No  LIVING ENVIRONMENT: Lives with: lives with their spouse Lives in: 3 story home Stairs: yes   PLOF: Independent  PATIENT GOALS: walk and climb stairs normal, improve strength.  L knee to function as R knee   OBJECTIVE:  Note: Objective measures were completed at Evaluation unless otherwise noted.  DIAGNOSTIC FINDINGS: L Tib/Fib X ray on 12/07/23: FINDINGS: No acute fracture or dislocation. Os trigonum. Moderate tricompartmental osteoarthritis of the knee, most severe in the patellofemoral compartment. Mild osteoarthritis of the ankle. Soft tissues are unremarkable.   IMPRESSION: 1. No acute fracture or dislocation. 2. Osteoarthritis of the knee and ankle, as above.  Knees x ray in 2021: IMPRESSION: 1. No fracture or  dislocation of the bilateral knees. 2. There is bilateral arthrosis of the knees, most significant in the patellofemoral compartments and worse on the right, with relatively preserved femorotibial joint spaces. 3. There are large calcified loose bodies bilaterally, larger and more numerous on the right. 4. Small bilateral knee joint effusions.  CT on 11/14: IMPRESSION: 1. Stable sclerotic metastatic osseous disease without new or progressive findings or pathologic fracture. 2. Patchy tree-in-bud nodularity in the right middle lobe, suggestive of inflammatory process or atypical infection such as nontuberculous mycobacterial infection. 3. Liver morphology suggestive of cirrhosis without focal hepatic lesion. 4. No findings for metastatic disease soft tissue disease involving the chest abdomen or pelvis.  PATIENT SURVEYS:  LEFS:  40/80 (Eval) LEFS:  22/80 (  03/21/24)  COGNITION: Overall cognitive status: Within functional limits for tasks assessed        LOWER EXTREMITY ROM:  AROM Right eval Left eval Left 1/8 Left  Hip flexion      Hip extension      Hip abduction      Hip adduction      Hip internal rotation      Hip external rotation      Knee flexion 134 111  106  Knee extension 3/0 AROM/PROM 10/7 AROM/PROM in supine; Lacking 20 deg with LAQ 11/7 AROM/PROM in supine 8/6 AROM/PROM in supine; Lacking 19 deg with LAQ  Ankle dorsiflexion      Ankle plantarflexion      Ankle inversion      Ankle eversion       (Blank rows = not tested)  LOWER EXTREMITY MMT:  MMT Right eval Left eval Right 12/9 Left 12/9 Right 1/19 Left 1/19  Hip flexion 5/5 4+/5 with pain    4/5 with min pain  Hip extension        Hip abduction Dificulty with correct form including keeping knee straight but tolerates good resistance <3/5 29.4 24.4 31.3 30.8  Hip adduction        Hip internal rotation        Hip external rotation        Knee flexion 5/5 seated 5/5 seated      Knee extension  5/5 4-/5 with pain    4-/5 with pain  Ankle dorsiflexion        Ankle plantarflexion        Ankle inversion        Ankle eversion         (Blank rows = not tested)   FUNCTIONAL TESTS:  5x STS test: 22.6 sec with UE support.  Pt stopped at 4 reps due to knee pain.  GAIT: Assistive device utilized: None Level of assistance: Complete Independence Comments: Pt has significant genu varus at L knee.  Decreased L knee extension, L toe off, and foot clearance.  Slow gait speed.  3-5/10 pain in L knee with walking in hallway.                                                                                                                                 TREATMENT:    Nustep bilat UE/LE's lvl 4-5 x 5 mins   Supine quad set with ball under knee 2x10 reps with 5 sec hold Pt received L knee flexion and extension PROM in supine per pt tolerance. Pt received gentle manual knee extension stretch  Supine manual HS stretch 3x30 sec L LAQ 3x5 with YTB Seated HS curls with YTB x10, 5 reps Sit to stands with hands on LE's from elevated table x6, 4 reps Attempted Marching with bilat UE support (2 reps) though stopped due to pain Bwd ambulation at rail with SBA x 3 laps  PATIENT EDUCATION:  Education details: dx, exercise form, HEP, rationale of interventions, relevant anatomy, and POC.  PT answered pt's questions. Person educated: Patient Education method: Explanation, demonstration, verbal and tactile cues, handout Education comprehension: verbalized understanding and needs further education, returned demonstration, verbal and tactile cues required  HOME EXERCISE PROGRAM: Pt has a HEP.   Access Code: [email or web address] URL: [email or web address] Date: 02/09/2024 Prepared by: Mose Minerva  Exercises - Supine Bridge  - 1 x daily - 7 x weekly - 2 sets - 10 reps - Seated Heel Slide  - 2 x daily - 7 x weekly - 2 sets - 10 reps - Seated Hamstring Stretch  - 2 x daily - 7 x weekly - 2 reps  - 20 seconds hold - Seated Long Arc Quad  - 1 x daily - 7 x weekly - 2 sets - 10 reps - Supine Knee Extension Stretch on Towel Roll  - 2-3 x daily - 7 x weekly - 1 reps - 2 minutes hold - Supine Heel Slide with Strap  - 2 x daily - 7 x weekly - 1 sets - 10 reps - Seated Hip Abduction with Resistance  - 1 x daily - 4-5 x weekly - 2 sets - 10 reps   ASSESSMENT:  CLINICAL IMPRESSION: Pt presents to treatment reporting improved sx's with initially standing up.  He continues to c/o of pain with standing and is limited with standing.  PT attempted to progress exercises with standing and resistance.  Pt reports increased pain with progressing exercises.  PT decreased LAQ to 5 reps with resistance and pt decreased reps with seated HS curl on the 2nd set.  PT attempted marching though pt unable to perform due to pain.  He continues to be limited with sit to stands having to perform on an elevated table though overall seems to have some improvement.  Pt reports no increased pain and has no c/o's after treatment.    Pt may benefit from continued skilled PT to address ongoing goals and impairments and to maximize functional mobility.     OBJECTIVE IMPAIRMENTS: Abnormal gait, decreased activity tolerance, decreased endurance, decreased mobility, difficulty walking, decreased ROM, decreased strength, hypomobility, impaired flexibility, and pain.   ACTIVITY LIMITATIONS: carrying, bending, standing, squatting, stairs, transfers, and locomotion level  PARTICIPATION LIMITATIONS: cleaning, shopping, and community activity  PERSONAL FACTORS: 3+ comorbidities: metastatic CA, anemia, L ankle OA are also affecting patient's functional outcome.   REHAB POTENTIAL: Good  CLINICAL DECISION MAKING: Evolving/moderate complexity  EVALUATION COMPLEXITY: Moderate   GOALS:  SHORT TERM GOALS: Target date: 03/01/2024  Pt will be independent and compliant with HEP for improved pain, strength, ROM, and  function. Baseline: Goal status: GOAL MET  1/8  2.  Pt will demonstrate improved time on the 5x STS test by at least 5 seconds for improved functional LE strength and performance of transfers.  Baseline:  Goal status:  NOT MET  1/19  3.  Pt will demonstrate improved L knee AROM by at least 5 degrees in both flexion and extension for improved stiffness, mobility, and gait.  Baseline:  Goal status: ONGOING  4.  Pt will demo at least a 10 deg improvement with seated LAQ for improved quad strength and knee mobility.  Baseline:  Goal status: NOT MET  1/19  5.  Pt will report at least a 25% improvement overall in pain and daily mobility.  Baseline:  Goal status:  GOAL MET  1/19    LONG TERM GOALS:  Target date: 04/25/2024  Pt will demo a reciprocal gait on the stairs with the rail and report he is able to perform his stairs at home without difficulty.  Baseline:  Goal status: ONGOING  2.  Pt will report he is able to perform extended community ambulation without significant difficulty and pain.  Baseline:  Goal status: ONGOING  3.  Pt will report at least a 60% improvement overall in his knee pain and daily mobility.  Baseline:  Goal status:  ONGOING  4.  Pt will demo improved L hip flexion and knee extension strength to 5/5 MMT for improved performance of and tolerance with functional mobility.  (May add hip abd goal after testing next visit) Baseline:  Goal status: NOT MET    5.  Pt will perform the 5x STS test in < than 16 seconds for improved functional LE strength and performance of transfers Baseline:  Goal status: NOT MET      PLAN:  PT FREQUENCY:1-2x/wk  PT DURATION: 5 weeks  PLANNED INTERVENTIONS: [id number]- PT Re-evaluation, 97750- Physical Performance Testing, 97110-Therapeutic exercises, 97530- Therapeutic activity, [id number]- Neuromuscular re-education, 97535- Self Care, [id number]- Manual therapy, [id number]- Gait training, [phone]- Aquatic Therapy, Patient/Family education,  Balance training, Stair training, Taping, and Cryotherapy  PLAN FOR NEXT SESSION: calf and HS stretching.  LE strengthening.  Work on [employer].  Sit to stands.  Gait. Knee extension ROM.    Leigh Minerva III PT, DPT 04/01/24 2:00 PM              "

## 2024-04-04 ENCOUNTER — Encounter (HOSPITAL_BASED_OUTPATIENT_CLINIC_OR_DEPARTMENT_OTHER): Payer: Self-pay | Admitting: Physical Therapy

## 2024-04-04 ENCOUNTER — Encounter (HOSPITAL_BASED_OUTPATIENT_CLINIC_OR_DEPARTMENT_OTHER): Admitting: Physical Therapy

## 2024-04-06 ENCOUNTER — Ambulatory Visit (HOSPITAL_BASED_OUTPATIENT_CLINIC_OR_DEPARTMENT_OTHER): Admitting: Physical Therapy

## 2024-04-06 ENCOUNTER — Encounter (HOSPITAL_BASED_OUTPATIENT_CLINIC_OR_DEPARTMENT_OTHER): Payer: Self-pay | Admitting: Physical Therapy

## 2024-04-06 DIAGNOSIS — M25662 Stiffness of left knee, not elsewhere classified: Secondary | ICD-10-CM

## 2024-04-06 DIAGNOSIS — M25562 Pain in left knee: Secondary | ICD-10-CM

## 2024-04-06 DIAGNOSIS — M6281 Muscle weakness (generalized): Secondary | ICD-10-CM

## 2024-04-06 DIAGNOSIS — R262 Difficulty in walking, not elsewhere classified: Secondary | ICD-10-CM

## 2024-04-06 NOTE — Therapy (Signed)
 " OUTPATIENT PHYSICAL THERAPY LOWER EXTREMITY TREATMENT   Patient Name: Philip [name] MRN: [id number] DOB:February 16, 1946, 79 y.o., male Today's Date: 04/07/2024  END OF SESSION:   PT End of Session - 04/06/2024    Visit Number 14   Number of Visits 20   Date for PT Re-Evaluation 04/25/2024   Authorization Type MCR   PT Start Time  1322   PT Stop Time 1404   PT Time Calculation (min) 42 min   Activity Tolerance Patient tolerated treatment well   Behavior During Therapy  [facility] for tasks assessed/performed         Past Medical History:  Diagnosis Date   Allergy    Anxiety    Arthritis    Atherosclerosis of aorta 03/11/2017   The 10-year ASCVD risk score Verdon DC Jr., et al., 2013) is: 24.6%   Values used to calculate the score:     Age: 60 years     Sex: Male     Is Non-Hispanic African American: No     Diabetic: No     Tobacco smoker: No     Systolic Blood Pressure: 154 mmHg     Is BP treated: No     HDL Cholesterol: 71.1 mg/dL     Total Cholesterol: 212 mg/dL   Cancer (HCC)    PROSTATE   Cataract    Chronic idiopathic constipation 03/11/2017   Depression    Edema leg 11/29/2014   left leg from foot to thigh, increasinly worse over last 4 weeks   Essential hypertension 03/30/2018   Gastroesophageal reflux disease without esophagitis 03/11/2017   Hyperlipidemia LDL goal <100 03/16/2017   The 10-year ASCVD risk score Verdon DC Jr., et al., 2013) is: 30.6%   Values used to calculate the score:     Age: 53 years     Sex: Male     Is Non-Hispanic African American: No     Diabetic: No     Tobacco smoker: No     Systolic Blood Pressure: 148 mmHg     Is BP treated: Yes     HDL Cholesterol: 69.3 mg/dL     Total Cholesterol: 211 mg/dL   Hypertension    Hypoglycemia    Swelling LAST 30 DAYS DR [facility] AWARE   LEFT LEG AND FOOT   Past Surgical History:  Procedure Laterality Date   BOWEL DECOMPRESSION N/A 03/07/2021   Procedure: BOWEL DECOMPRESSION;  Surgeon: Leigh Elspeth SQUIBB, MD;  Location:  [facility];  Service: Gastroenterology;  Laterality: N/A;   BOWEL DECOMPRESSION N/A 06/07/2021   Procedure: BOWEL DECOMPRESSION;  Surgeon: Rollin Dover, MD;  Location: [facility];  Service: Gastroenterology;  Laterality: N/A;   COLON SURGERY     FLEXIBLE SIGMOIDOSCOPY N/A 03/07/2021   Procedure: FLEXIBLE SIGMOIDOSCOPY;  Surgeon: Leigh Elspeth SQUIBB, MD;  Location: [facility];  Service: Gastroenterology;  Laterality: N/A;   FLEXIBLE SIGMOIDOSCOPY N/A 06/07/2021   Procedure: FLEXIBLE SIGMOIDOSCOPY;  Surgeon: Rollin Dover, MD;  Location: [facility];  Service: Gastroenterology;  Laterality: N/A;   GUM SURGERY     TEETH IMPLANTS ALSO   LAPAROSCOPIC SIGMOID COLECTOMY N/A 06/10/2021   Procedure: LAPAROSCOPIC ASSISTED SIGMOID COLECTOMY;  Surgeon: Gladis Cough, MD;  Location: [facility];  Service: General;  Laterality: N/A;   ORCHIECTOMY Bilateral 01/03/2015   Procedure: ORCHIECTOMY;  Surgeon: Ricardo Likens, MD;  Location: [facility];  Service: Urology;  Laterality: Bilateral;   Patient Active Problem List   Diagnosis Date Noted   Primary osteoarthritis of both knees 12/12/2023  Need for immunization against influenza 12/07/2023   Anemia due to zinc  deficiency 10/08/2022   Iron deficiency anemia 03/07/2021   Tinnitus of both ears 09/07/2019   Laryngopharyngeal reflux (LPR) 09/07/2019   Essential hypertension 03/30/2018   Port-A-Cath in place 08/25/2017   Hyperlipidemia LDL goal <100 03/16/2017   Atherosclerosis of aorta 03/11/2017   Chronic idiopathic constipation 03/11/2017   Seasonal allergic rhinitis due to pollen 03/11/2017   Gastroesophageal reflux disease without esophagitis 03/11/2017   Malignant neoplasm of bone (HCC) 10/16/2015   Prostate cancer (HCC) 02/02/2015    PCP: Joshua Debby CROME, MD   REFERRING PROVIDER: Joshua Debby CROME, MD   REFERRING DIAG: M17.0 (ICD-10-CM) - Primary osteoarthritis of both knees   THERAPY DIAG:  Left knee pain, unspecified  chronicity  Muscle weakness (generalized)  Difficulty in walking, not elsewhere classified  Stiffness of left knee, not elsewhere classified   Rationale for Evaluation and Treatment: Rehabilitation  ONSET DATE: PT order 12/12/23  SUBJECTIVE:   SUBJECTIVE STATEMENT: Pt states his knee has been painful today.  He reports having stiffness today.  Pt states he felt loose and good after prior treatment.  Pt c/o's of pain with standing and is limited with standing.  It's improving.        PERTINENT HISTORY: -Prostate CA with metastatic spread to lymph nodes and bone (dx 2016)--Pt is on oral chemotherapy.  Pt is followed by Dr. Federico and gets an annual CT scan.  Pt states his CA is not active.  Port-a-cath located on R side of chest. -possible lung infection -L ankle OA, R knee arthrosis -anemia, anxiety, depression, HTN -Scarring on liver--Pt states for now the liver is ok    PAIN:  L knee:  5/10 current, 7/10 worst Pt denies pain in R knee.  PRECAUTIONS: Other: metastatic CA   WEIGHT BEARING RESTRICTIONS: No  FALLS:  Has patient fallen in last 6 months? No  LIVING ENVIRONMENT: Lives with: lives with their spouse Lives in: 3 story home Stairs: yes   PLOF: Independent  PATIENT GOALS: walk and climb stairs normal, improve strength.  L knee to function as R knee   OBJECTIVE:  Note: Objective measures were completed at Evaluation unless otherwise noted.  DIAGNOSTIC FINDINGS: L Tib/Fib X ray on 12/07/23: FINDINGS: No acute fracture or dislocation. Os trigonum. Moderate tricompartmental osteoarthritis of the knee, most severe in the patellofemoral compartment. Mild osteoarthritis of the ankle. Soft tissues are unremarkable.   IMPRESSION: 1. No acute fracture or dislocation. 2. Osteoarthritis of the knee and ankle, as above.  Knees x ray in 2021: IMPRESSION: 1. No fracture or dislocation of the bilateral knees. 2. There is bilateral arthrosis of the knees,  most significant in the patellofemoral compartments and worse on the right, with relatively preserved femorotibial joint spaces. 3. There are large calcified loose bodies bilaterally, larger and more numerous on the right. 4. Small bilateral knee joint effusions.  CT on 11/14: IMPRESSION: 1. Stable sclerotic metastatic osseous disease without new or progressive findings or pathologic fracture. 2. Patchy tree-in-bud nodularity in the right middle lobe, suggestive of inflammatory process or atypical infection such as nontuberculous mycobacterial infection. 3. Liver morphology suggestive of cirrhosis without focal hepatic lesion. 4. No findings for metastatic disease soft tissue disease involving the chest abdomen or pelvis.  PATIENT SURVEYS:  LEFS:  40/80 (Eval) LEFS:  22/80 (03/21/24)  COGNITION: Overall cognitive status: Within functional limits for tasks assessed        LOWER EXTREMITY ROM:  AROM  Right eval Left eval Left 1/8 Left  Hip flexion      Hip extension      Hip abduction      Hip adduction      Hip internal rotation      Hip external rotation      Knee flexion 134 111  106  Knee extension 3/0 AROM/PROM 10/7 AROM/PROM in supine; Lacking 20 deg with LAQ 11/7 AROM/PROM in supine 8/6 AROM/PROM in supine; Lacking 19 deg with LAQ  Ankle dorsiflexion      Ankle plantarflexion      Ankle inversion      Ankle eversion       (Blank rows = not tested)  LOWER EXTREMITY MMT:  MMT Right eval Left eval Right 12/9 Left 12/9 Right 1/19 Left 1/19  Hip flexion 5/5 4+/5 with pain    4/5 with min pain  Hip extension        Hip abduction Dificulty with correct form including keeping knee straight but tolerates good resistance <3/5 29.4 24.4 31.3 30.8  Hip adduction        Hip internal rotation        Hip external rotation        Knee flexion 5/5 seated 5/5 seated      Knee extension 5/5 4-/5 with pain    4-/5 with pain  Ankle dorsiflexion        Ankle  plantarflexion        Ankle inversion        Ankle eversion         (Blank rows = not tested)   FUNCTIONAL TESTS:  5x STS test: 22.6 sec with UE support.  Pt stopped at 4 reps due to knee pain.  GAIT: Assistive device utilized: None Level of assistance: Complete Independence Comments: Pt has significant genu varus at L knee.  Decreased L knee extension, L toe off, and foot clearance.  Slow gait speed.  3-5/10 pain in L knee with walking in hallway.                                                                                                                                 TREATMENT:    Nustep bilat UE/LE's lvl 4-5 x 5 mins   Pt received L knee flexion and extension PROM in supine per pt tolerance. Pt received manual knee extension stretch with heel on half roll  LAQ 3x5 with YTB Seated HS curls with YTB 2x10 Supine manual HS stretch 3x30 sec L Sit to stands with hands on LE's from elevated table x9, 5 reps Bwd ambulation at rail with SBA x 3 laps Standing calf stretch at wall  2 reps of 20-25 secs bilat     PATIENT EDUCATION:  Education details: dx, exercise form, HEP, rationale of interventions, relevant anatomy, and POC.  PT answered pt's questions. Person educated: Patient Education method: Explanation, demonstration, verbal and tactile cues,  handout Education comprehension: verbalized understanding and needs further education, returned demonstration, verbal and tactile cues required  HOME EXERCISE PROGRAM: Pt has a HEP.   Access Code: [email or web address] URL: [email or web address] Date: 02/09/2024 Prepared by: Mose Minerva  Exercises - Supine Bridge  - 1 x daily - 7 x weekly - 2 sets - 10 reps - Seated Heel Slide  - 2 x daily - 7 x weekly - 2 sets - 10 reps - Seated Hamstring Stretch  - 2 x daily - 7 x weekly - 2 reps - 20 seconds hold - Seated Long Arc Quad  - 1 x daily - 7 x weekly - 2 sets - 10 reps - Supine Knee Extension Stretch on Towel Roll  - 2-3 x  daily - 7 x weekly - 1 reps - 2 minutes hold - Supine Heel Slide with Strap  - 2 x daily - 7 x weekly - 1 sets - 10 reps - Seated Hip Abduction with Resistance  - 1 x daily - 4-5 x weekly - 2 sets - 10 reps   ASSESSMENT:  CLINICAL IMPRESSION: He continues to be limited with tolerance to exercise though is slowly improving.  He was able to perform 2 sets of 10 reps with seated HS curls.  Pt increased reps of sit to stands today.  He continues to be limited with sit to stands having to perform on an elevated table though overall seems to have some improvement.  Pt tolerated knee PROM well though does have some pain with knee extension.  He responded ewll to treatment stating he feels good after treatment with pain improving to 4/10.  Pt may benefit from continued skilled PT to address ongoing goals and impairments and to maximize functional mobility.   OBJECTIVE IMPAIRMENTS: Abnormal gait, decreased activity tolerance, decreased endurance, decreased mobility, difficulty walking, decreased ROM, decreased strength, hypomobility, impaired flexibility, and pain.   ACTIVITY LIMITATIONS: carrying, bending, standing, squatting, stairs, transfers, and locomotion level  PARTICIPATION LIMITATIONS: cleaning, shopping, and community activity  PERSONAL FACTORS: 3+ comorbidities: metastatic CA, anemia, L ankle OA are also affecting patient's functional outcome.   REHAB POTENTIAL: Good  CLINICAL DECISION MAKING: Evolving/moderate complexity  EVALUATION COMPLEXITY: Moderate   GOALS:  SHORT TERM GOALS: Target date: 03/01/2024  Pt will be independent and compliant with HEP for improved pain, strength, ROM, and function. Baseline: Goal status: GOAL MET  1/8  2.  Pt will demonstrate improved time on the 5x STS test by at least 5 seconds for improved functional LE strength and performance of transfers.  Baseline:  Goal status:  NOT MET  1/19  3.  Pt will demonstrate improved L knee AROM by at least 5  degrees in both flexion and extension for improved stiffness, mobility, and gait.  Baseline:  Goal status: ONGOING  4.  Pt will demo at least a 10 deg improvement with seated LAQ for improved quad strength and knee mobility.  Baseline:  Goal status: NOT MET  1/19  5.  Pt will report at least a 25% improvement overall in pain and daily mobility.  Baseline:  Goal status:  GOAL MET  1/19    LONG TERM GOALS: Target date: 04/25/2024  Pt will demo a reciprocal gait on the stairs with the rail and report he is able to perform his stairs at home without difficulty.  Baseline:  Goal status: ONGOING  2.  Pt will report he is able to perform extended community ambulation without significant difficulty and pain.  Baseline:  Goal status: ONGOING  3.  Pt will report at least a 60% improvement overall in his knee pain and daily mobility.  Baseline:  Goal status:  ONGOING  4.  Pt will demo improved L hip flexion and knee extension strength to 5/5 MMT for improved performance of and tolerance with functional mobility.  (May add hip abd goal after testing next visit) Baseline:  Goal status: NOT MET    5.  Pt will perform the 5x STS test in < than 16 seconds for improved functional LE strength and performance of transfers Baseline:  Goal status: NOT MET      PLAN:  PT FREQUENCY:1-2x/wk  PT DURATION: 5 weeks  PLANNED INTERVENTIONS: [id number]- PT Re-evaluation, 97750- Physical Performance Testing, 97110-Therapeutic exercises, 97530- Therapeutic activity, [id number]- Neuromuscular re-education, 97535- Self Care, [id number]- Manual therapy, [id number]- Gait training, [phone]- Aquatic Therapy, Patient/Family education, Balance training, Stair training, Taping, and Cryotherapy  PLAN FOR NEXT SESSION: calf and HS stretching.  LE strengthening.  Work on [employer].  Sit to stands.  Gait. Knee extension ROM.    Leigh Minerva III PT, DPT 04/07/24 10:46 PM               "

## 2024-04-11 ENCOUNTER — Ambulatory Visit (HOSPITAL_BASED_OUTPATIENT_CLINIC_OR_DEPARTMENT_OTHER): Admitting: Physical Therapy

## 2024-04-21 ENCOUNTER — Encounter (HOSPITAL_BASED_OUTPATIENT_CLINIC_OR_DEPARTMENT_OTHER): Payer: Self-pay | Admitting: Physical Therapy

## 2024-04-25 ENCOUNTER — Ambulatory Visit: Payer: Medicare Other

## 2024-05-09 ENCOUNTER — Encounter (HOSPITAL_BASED_OUTPATIENT_CLINIC_OR_DEPARTMENT_OTHER): Payer: Self-pay | Admitting: Physical Therapy

## 2024-05-12 ENCOUNTER — Encounter (HOSPITAL_BASED_OUTPATIENT_CLINIC_OR_DEPARTMENT_OTHER): Payer: Self-pay | Admitting: Physical Therapy

## 2024-05-16 ENCOUNTER — Encounter (HOSPITAL_BASED_OUTPATIENT_CLINIC_OR_DEPARTMENT_OTHER): Payer: Self-pay | Admitting: Physical Therapy

## 2024-05-18 ENCOUNTER — Encounter (HOSPITAL_BASED_OUTPATIENT_CLINIC_OR_DEPARTMENT_OTHER): Payer: Self-pay | Admitting: Physical Therapy

## 2024-05-23 ENCOUNTER — Encounter (HOSPITAL_BASED_OUTPATIENT_CLINIC_OR_DEPARTMENT_OTHER): Payer: Self-pay | Admitting: Physical Therapy

## 2024-05-25 ENCOUNTER — Encounter (HOSPITAL_BASED_OUTPATIENT_CLINIC_OR_DEPARTMENT_OTHER): Payer: Self-pay | Admitting: Physical Therapy

## 2024-06-01 ENCOUNTER — Inpatient Hospital Stay

## 2024-06-03 ENCOUNTER — Inpatient Hospital Stay: Admitting: Hematology and Oncology

## 2024-06-03 ENCOUNTER — Inpatient Hospital Stay

## 2024-06-06 ENCOUNTER — Ambulatory Visit: Admitting: Internal Medicine

## 2024-08-24 ENCOUNTER — Inpatient Hospital Stay

## 2024-08-26 ENCOUNTER — Inpatient Hospital Stay

## 2024-08-26 ENCOUNTER — Inpatient Hospital Stay: Admitting: Hematology and Oncology

## 2024-11-16 ENCOUNTER — Inpatient Hospital Stay

## 2024-11-18 ENCOUNTER — Inpatient Hospital Stay

## 2024-11-18 ENCOUNTER — Inpatient Hospital Stay: Admitting: Hematology and Oncology
